# Patient Record
Sex: Male | Born: 1990 | Race: Asian | Hispanic: No | Marital: Single | Smoking: Never smoker
Health system: Southern US, Community
[De-identification: ages and names within clinical notes are randomized; demographics above are authoritative.]

## PROBLEM LIST (undated history)

## (undated) ENCOUNTER — Emergency Department (HOSPITAL_COMMUNITY): Admission: EM | Payer: Self-pay

## (undated) DIAGNOSIS — H9190 Unspecified hearing loss, unspecified ear: Secondary | ICD-10-CM

---

## 2011-10-21 ENCOUNTER — Emergency Department (INDEPENDENT_AMBULATORY_CARE_PROVIDER_SITE_OTHER): Payer: Medicaid Other

## 2011-10-21 ENCOUNTER — Encounter: Payer: Self-pay | Admitting: Emergency Medicine

## 2011-10-21 ENCOUNTER — Emergency Department (INDEPENDENT_AMBULATORY_CARE_PROVIDER_SITE_OTHER)
Admission: EM | Admit: 2011-10-21 | Discharge: 2011-10-21 | Disposition: A | Discharge status: Home / Self Care | Payer: Medicaid Other | Source: Home / Self Care | Attending: Emergency Medicine | Admitting: Emergency Medicine

## 2011-10-21 DIAGNOSIS — R6889 Other general symptoms and signs: Secondary | ICD-10-CM

## 2011-10-21 HISTORY — DX: Unspecified hearing loss, unspecified ear: H91.90

## 2011-10-21 MED ORDER — BENZONATATE 200 MG PO CAPS: 200.0000 mg | ORAL_CAPSULE | Freq: Three times a day (TID) | ORAL | Status: AC | PRN

## 2011-10-21 MED ORDER — TRAMADOL HCL 50 MG PO TABS: 100.0000 mg | ORAL_TABLET | Freq: Three times a day (TID) | ORAL | Status: AC | PRN

## 2011-10-21 NOTE — ED Provider Notes (Signed)
History     CSN: [id number] Arrival date & time: 10/21/2011  8:55 PM   First MD Initiated Contact with Patient 10/21/11 2014      Chief Complaint  Patient presents with  . Cough  . Fever    (Consider location/radiation/quality/duration/timing/severity/associated sxs/prior treatment) HPI Comments: He is a 20 year old male, a [name] who has only lived in [place] for a short time. 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 Now he's had a three-day history of fever, rhinorrhea, cough productive of white sputum, generalized aches, chest pain, and nasal congestion with white drainage. He's also had a sore throat and he vomited once last week. He has not had the flu vaccine.  His mother states he was born hearing, but hasn't illness at a young age and after that lost his ability to hear.  Patient is a 20 y.o. male presenting with cough and fever.  Cough Associated symptoms include rhinorrhea, sore throat and myalgias. Pertinent negatives include no chills, no ear pain, no shortness of breath, no wheezing and no eye redness.  Fever Primary symptoms of the febrile illness include fever, cough and myalgias. Primary symptoms do not include fatigue, wheezing, shortness of breath, abdominal pain, nausea, vomiting, diarrhea or rash.    Past Medical History  Diagnosis Date  . Deaf     History reviewed. No pertinent past surgical history.  History reviewed. No pertinent family history.  History  Substance Use Topics  . Smoking status: Never Smoker   . Smokeless tobacco: Not on file  . Alcohol Use: No      Review of Systems  Constitutional: Positive for fever. Negative for chills and fatigue.  HENT: Positive for hearing loss, congestion, sore throat and  rhinorrhea. Negative for ear pain, sneezing, neck stiffness, voice change and postnasal drip.   Eyes: Negative for pain, discharge and redness.  Respiratory: Positive for cough. Negative for chest tightness, shortness of breath and wheezing.   Gastrointestinal: Negative for nausea, vomiting, abdominal pain and diarrhea.  Musculoskeletal: Positive for myalgias.  Skin: Negative for rash.    Allergies  Review of patient's allergies indicates no known allergies.  Home Medications   Current Outpatient Rx  Name Route Sig Dispense Refill  . BENZONATATE 200 MG PO CAPS Oral Take 1 capsule (200 mg total) by mouth 3 (three) times daily as needed for cough. 30 capsule 0  . TRAMADOL HCL 50 MG PO TABS Oral Take 2 tablets (100 mg total) by mouth every 8 (eight) hours as needed for pain. Maximum dose= 8 tablets per day 30 tablet 0    BP 120/70  Pulse 73  Temp(Src) 98.6 F (37 C) (Oral)  Resp 20  SpO2 96%  Physical Exam  Nursing note and vitals reviewed. Constitutional: He appears well-developed and well-nourished. No distress.  HENT:  Head: Normocephalic and atraumatic.  Right Ear: External ear normal.  Left Ear: External ear normal.  Nose: Nose normal.  Mouth/Throat: Oropharynx is clear and moist. No oropharyngeal exudate.       Both ears show chronic otitis media without much drainage or exudate.  Eyes: Conjunctivae and EOM are normal. Pupils are equal, round, and reactive to light. Right eye exhibits no discharge. Left eye exhibits no discharge.  Neck: Normal range of motion.  Neck supple.  Cardiovascular: Normal rate, regular rhythm and normal heart sounds.   Pulmonary/Chest: Effort normal and breath sounds normal. No stridor. No respiratory distress. He has no wheezes. He has no rales. He exhibits no tenderness.  Lymphadenopathy:    He has no cervical adenopathy.  Skin: Skin is warm and dry. No rash noted. He is not diaphoretic.    ED Course  Procedures (including critical care  time)  Labs Reviewed - No data to display Dg Chest 2 View  10/21/2011  *RADIOLOGY REPORT*  Clinical Data: Cough and fever  CHEST - 2 VIEW  Comparison: None.  Findings: The heart, mediastinal, and hilar contours are normal. The lungs are well-expanded and clear. Negative for pleural effusion. The bony thorax is unremarkable.  IMPRESSION: No acute cardiopulmonary disease.  Original Report Authenticated By: Britta Mccreedy, M.D.     1. Influenza-like illness       MDM  Right now he has an influenza-like illness, but he appears to have chronic otitis media as the cause of his deafness. It's possible that with surgery and hearing aids this might be correctable.        Roque Lias, MD 10/21/11 2226

## 2011-10-21 NOTE — ED Notes (Signed)
Pt brought in nonenglish and deaf accompanied by mother and sign language interpretor present with c/o constant cough with white drainage,runny nose and warm to touch that started x1 wk ago according to [employer] translation line.afebrile today.no hx asthma.mother has been giving child nyquil for cough

## 2012-12-16 IMAGING — CR DG CHEST 2V
2 series · 2 of 2 positions shown · non-contrast
Comparison: None.

CLINICAL DATA: Cough and fever

CHEST - 2 VIEW

[view not recorded (1 of 2)]
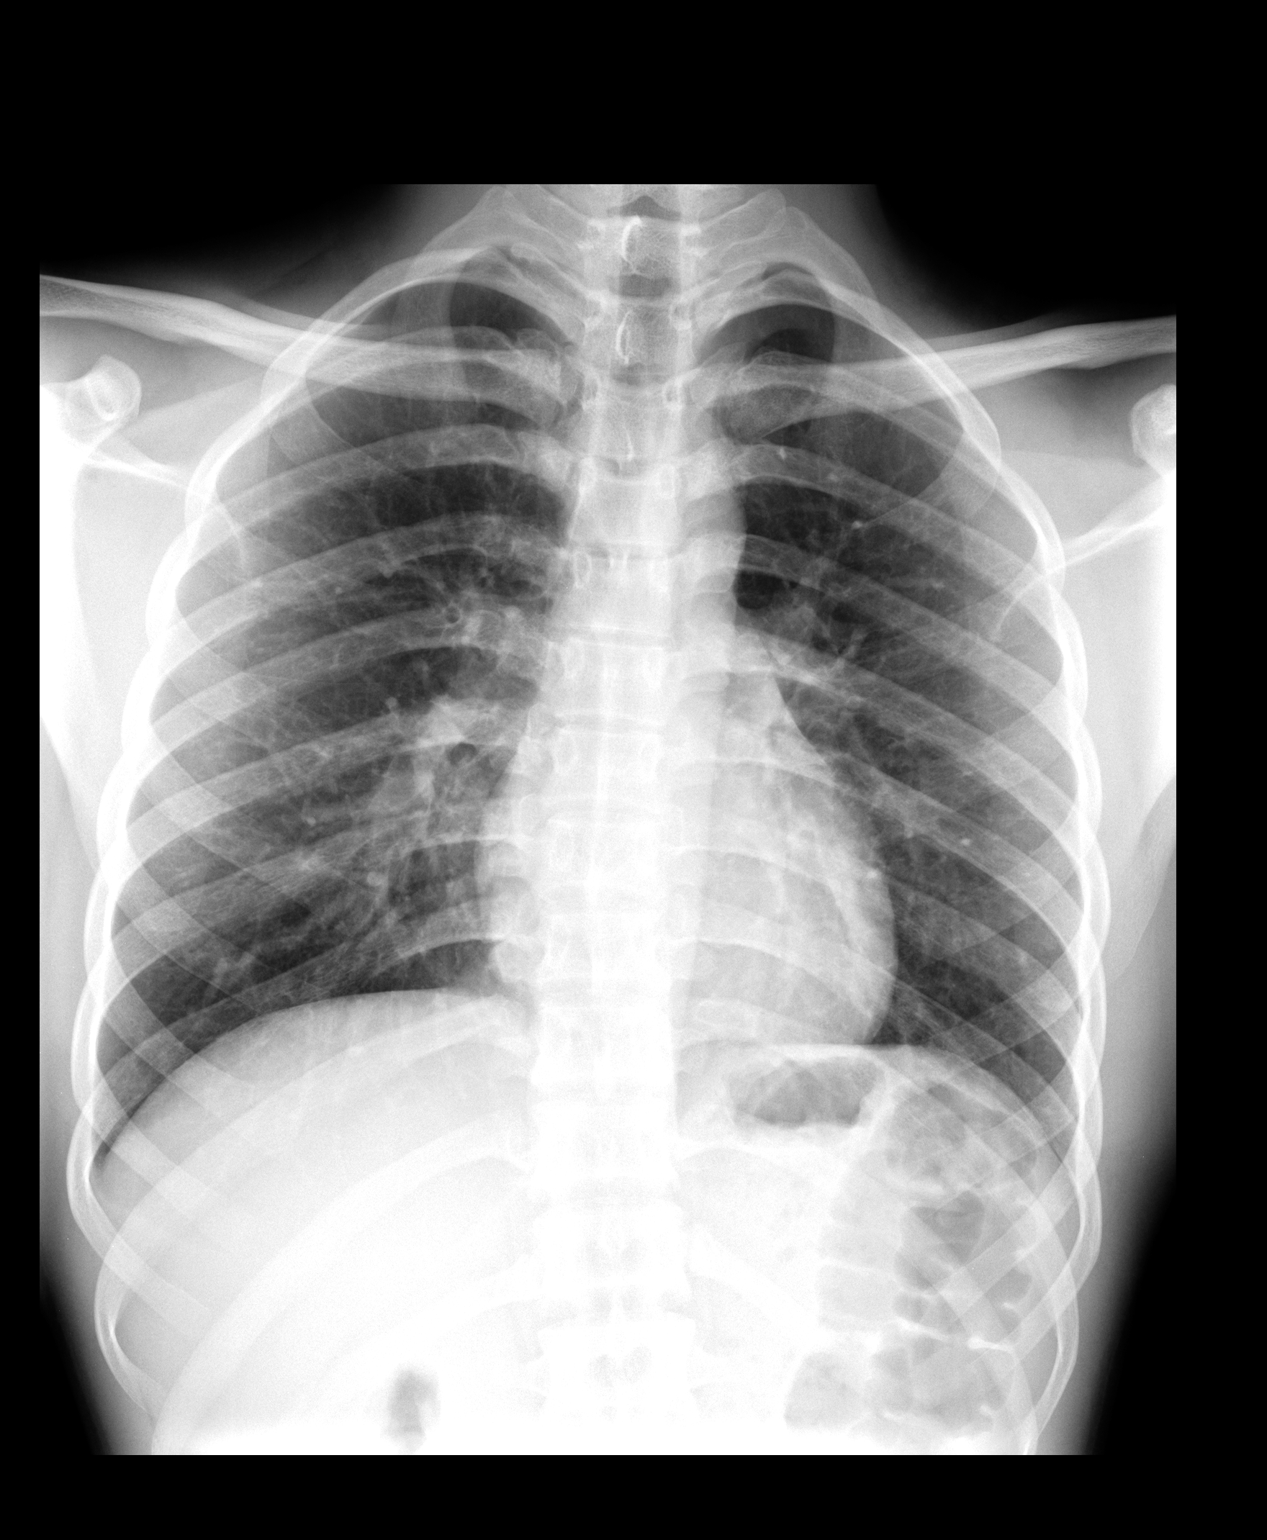

[view not recorded (2 of 2)]
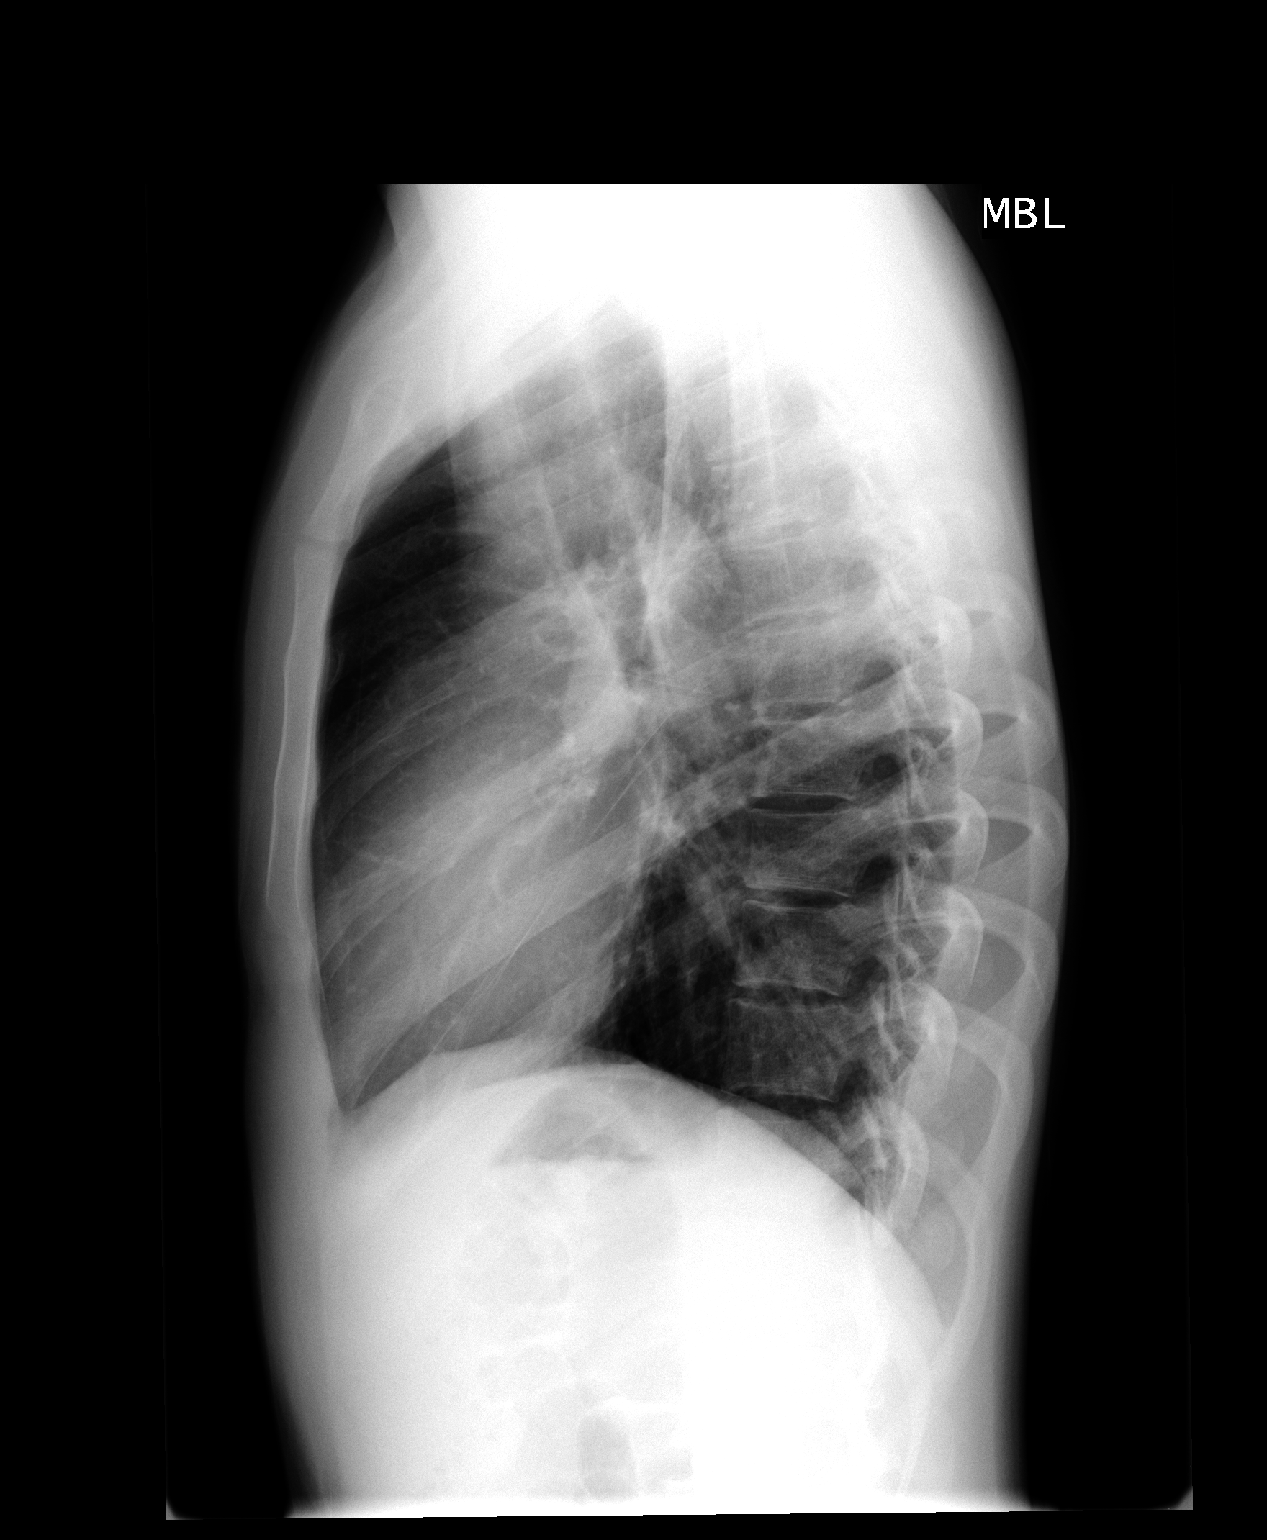

[2 of 2 positions shown; findings below may reference images not displayed]

FINDINGS: The heart, mediastinal, and hilar contours are normal.
The lungs are well-expanded and clear. Negative for pleural
effusion. The bony thorax is unremarkable.
IMPRESSION: No acute cardiopulmonary disease.

## 2019-06-17 ENCOUNTER — Other Ambulatory Visit: Payer: Self-pay | Admitting: Family Medicine

## 2019-06-17 DIAGNOSIS — Z20822 Contact with and (suspected) exposure to covid-19: Secondary | ICD-10-CM

## 2019-06-21 ENCOUNTER — Other Ambulatory Visit: Payer: Self-pay

## 2019-06-21 DIAGNOSIS — Z20822 Contact with and (suspected) exposure to covid-19: Secondary | ICD-10-CM

## 2019-06-22 LAB — NOVEL CORONAVIRUS, NAA: SARS-CoV-2, NAA: NOT DETECTED

## 2019-10-22 ENCOUNTER — Telehealth: Payer: Self-pay

## 2019-10-22 NOTE — Telephone Encounter (Signed)
Caller given negative and verbalized understanding

## 2020-05-18 ENCOUNTER — Ambulatory Visit: Payer: Self-pay | Attending: Internal Medicine

## 2020-05-18 DIAGNOSIS — Z23 Encounter for immunization: Secondary | ICD-10-CM

## 2020-05-18 NOTE — Progress Notes (Signed)
° °  Covid-19 Vaccination Clinic  Name:  Samuel Warren    MRN: [id number] DOB: February 07, 1991  05/18/2020  Samuel Warren was observed post Covid-19 immunization for 15 minutes without incident. He was provided with Vaccine Information Sheet and instruction to access the V-Safe system.   Samuel Warren was instructed to call 911 with any severe reactions post vaccine:  Difficulty breathing   Swelling of face and throat   A fast heartbeat   A bad rash all over body   Dizziness and weakness   Immunizations Administered    Name Date Dose VIS Date Route   Pfizer COVID-19 Vaccine 05/18/2020 10:44 AM 0.3 mL 01/05/2019 Intramuscular   Manufacturer: [employer], [employer]   Lot: [id number]   NDC: [address]-5

## 2020-06-10 ENCOUNTER — Ambulatory Visit: Payer: Self-pay | Attending: Internal Medicine

## 2020-06-10 ENCOUNTER — Other Ambulatory Visit: Payer: Self-pay

## 2020-06-10 DIAGNOSIS — Z23 Encounter for immunization: Secondary | ICD-10-CM

## 2020-06-10 NOTE — Progress Notes (Signed)
   Covid-19 Vaccination Clinic  Name:  Samuel Warren    MRN: [id number] DOB: 1991/10/20  06/10/2020  Mr. [name] was observed post Covid-19 immunization for 15 minutes without incident. He was provided with Vaccine Information Sheet and instruction to access the V-Safe system.   Mr. [name] was instructed to call 911 with any severe reactions post vaccine: Marland Kitchen Difficulty breathing  . Swelling of face and throat  . A fast heartbeat  . A bad rash all over body  . Dizziness and weakness   Immunizations Administered    Name Date Dose VIS Date Route   Pfizer COVID-19 Vaccine 06/10/2020  9:25 AM 0.3 mL 01/05/2019 Intramuscular   Manufacturer: [employer], [employer]   Lot: [id number]   NDC: [address]-4

## 2021-10-07 ENCOUNTER — Emergency Department (HOSPITAL_COMMUNITY)
Admission: EM | Admit: 2021-10-07 | Discharge: 2021-10-07 | Disposition: A | Discharge status: Home / Self Care | Payer: 59 | Attending: Emergency Medicine | Admitting: Emergency Medicine

## 2021-10-07 ENCOUNTER — Other Ambulatory Visit: Payer: Self-pay

## 2021-10-07 ENCOUNTER — Encounter (HOSPITAL_COMMUNITY): Payer: Self-pay | Admitting: Emergency Medicine

## 2021-10-07 DIAGNOSIS — S61411A Laceration without foreign body of right hand, initial encounter: Secondary | ICD-10-CM | POA: Insufficient documentation

## 2021-10-07 DIAGNOSIS — W458XXA Other foreign body or object entering through skin, initial encounter: Secondary | ICD-10-CM | POA: Insufficient documentation

## 2021-10-07 DIAGNOSIS — Z23 Encounter for immunization: Secondary | ICD-10-CM | POA: Insufficient documentation

## 2021-10-07 DIAGNOSIS — L089 Local infection of the skin and subcutaneous tissue, unspecified: Secondary | ICD-10-CM

## 2021-10-07 DIAGNOSIS — Y9339 Activity, other involving climbing, rappelling and jumping off: Secondary | ICD-10-CM | POA: Insufficient documentation

## 2021-10-07 DIAGNOSIS — S6991XA Unspecified injury of right wrist, hand and finger(s), initial encounter: Secondary | ICD-10-CM | POA: Diagnosis present

## 2021-10-07 MED ORDER — TETANUS-DIPHTH-ACELL PERTUSSIS 5-2.5-18.5 LF-MCG/0.5 IM SUSY
0.5000 mL | PREFILLED_SYRINGE | Freq: Once | INTRAMUSCULAR | Status: AC
  Administered 2021-10-07: 0.5 mL via INTRAMUSCULAR
  Filled 2021-10-07: qty 0.5

## 2021-10-07 MED ORDER — SULFAMETHOXAZOLE-TRIMETHOPRIM 800-160 MG PO TABS: 1.0000 | ORAL_TABLET | Freq: Two times a day (BID) | ORAL | 0 refills | Status: AC

## 2021-10-07 MED ORDER — IBUPROFEN 600 MG PO TABS
600.0000 mg | ORAL_TABLET | Freq: Four times a day (QID) | ORAL | 0 refills | Status: DC | PRN
Start: 2021-10-07 — End: 2024-01-25

## 2021-10-07 NOTE — Discharge Instructions (Signed)
Please cleanse wound with gentle soap and water several times daily.  Take antibiotic for the full duration.  Take ibuprofen as needed for pain.  Return if your symptoms worsen.   Hy lm s?ch v?t th??ng b?ng x phng d?u nh? v n??c nhi?u l?n m?i ngy. U?ng khng sinh ?? th?i gian. U?ng ibuprofen khi c?n thi?t ?? gi?m ?au. Quay tr? l?i n?u cc tri?u ch?ng c?a b?n x?u ?i.

## 2021-10-07 NOTE — ED Provider Notes (Signed)
[facility] EMERGENCY DEPARTMENT Provider Note   CSN: [id number] Arrival date & time: 10/07/21  1215     History No chief complaint on file.   Samuel Warren is a 30 y.o. male.  The history is provided by a parent. No language interpreter was used.    30 year old male with hx of deafness, accompany by father to there ER for hand injury.  Hx obtain through father.  Father report a few days ago pt was climbing a tree and was trying to nail something to the tree when he suffered a laceration to the palm of his R hand.  Since then it has increasingly becoming more swollen, with redness and more painful.  No fever, unsure last tdap.  No specific treatment tried.  Pt is R hand dominant.    No past medical history on file.  There are no problems to display for this patient.  The histories are not reviewed yet. Please review them in the "History" navigator section and refresh this SmartLink.     No family history on file.     Home Medications Prior to Admission medications   Not on File    Allergies    Patient has no allergy information on record.  Review of Systems   Review of Systems  Constitutional:  Negative for fever.  Skin:  Positive for wound.   Physical Exam Updated Vital Signs BP 104/79 (BP Location: Left Arm)   Pulse 65   Temp 98.3 F (36.8 C)   Resp 16   SpO2 99%   Physical Exam Vitals and nursing note reviewed.  Constitutional:      General: He is not in acute distress.    Appearance: He is well-developed.  HENT:     Head: Atraumatic.  Eyes:     Conjunctiva/sclera: Conjunctivae normal.  Musculoskeletal:        General: Signs of injury (R hand: a crescent shape 2cm laceration noted to the hypothenar eminence with surrounding skin erythema noted, ttp.  no abscess) present.     Cervical back: Neck supple.  Skin:    Findings: No rash.  Neurological:     Mental Status: He is alert.    ED Results / Procedures / Treatments    Labs (all labs ordered are listed, but only abnormal results are displayed) Labs Reviewed - No data to display  EKG None  Radiology No results found.  Procedures Procedures   Medications Ordered in ED Medications  Tdap (BOOSTRIX) injection 0.5 mL (has no administration in time range)    ED Course  I have reviewed the triage vital signs and the nursing notes.  Pertinent labs & imaging results that were available during my care of the patient were reviewed by me and considered in my medical decision making (see chart for details).    MDM Rules/Calculators/A&P                           BP 104/79 (BP Location: Left Arm)   Pulse 65   Temp 98.3 F (36.8 C)   Resp 16   SpO2 99%   Final Clinical Impression(s) / ED Diagnoses Final diagnoses:  Laceration of right hand with infection, initial encounter    Rx / DC Orders ED Discharge Orders          Ordered    sulfamethoxazole-trimethoprim (BACTRIM DS) 800-160 MG tablet  2 times daily  10/07/21 1307    ibuprofen (ADVIL) 600 MG tablet  Every 6 hours PRN        10/07/21 1307           1:05 PM Pt with a gaping superficial laceration noted to R hand in the hypothenar eminence.  The laceration is not amenable for lac repair.  There are signs of infection, will prescribe abx, recommend appropriate wound care and return precaution given.  Tdap given. BP 104/79 (BP Location: Left Arm)   Pulse 65   Temp 98.3 F (36.8 C)   Resp 16   SpO2 99%     Fayrene Helper, PA-C 10/07/21 1309    Terald Sleeper, MD 10/07/21 1357

## 2021-10-07 NOTE — ED Triage Notes (Signed)
C/o laceration to R hand from a nail yesterday.  Redness around wound.

## 2024-01-25 ENCOUNTER — Other Ambulatory Visit: Payer: Self-pay

## 2024-01-25 ENCOUNTER — Emergency Department (HOSPITAL_COMMUNITY)

## 2024-01-25 ENCOUNTER — Encounter (HOSPITAL_COMMUNITY): Payer: Self-pay

## 2024-01-25 ENCOUNTER — Inpatient Hospital Stay (HOSPITAL_COMMUNITY)
Admission: EM | Admit: 2024-01-25 | Discharge: 2024-01-29 | DRG: 097 | Disposition: A | Discharge status: Home / Self Care | Attending: Internal Medicine | Admitting: Internal Medicine

## 2024-01-25 DIAGNOSIS — R519 Headache, unspecified: Secondary | ICD-10-CM | POA: Diagnosis present

## 2024-01-25 DIAGNOSIS — Z7982 Long term (current) use of aspirin: Secondary | ICD-10-CM

## 2024-01-25 DIAGNOSIS — D72829 Elevated white blood cell count, unspecified: Secondary | ICD-10-CM | POA: Diagnosis not present

## 2024-01-25 DIAGNOSIS — R7989 Other specified abnormal findings of blood chemistry: Secondary | ICD-10-CM | POA: Diagnosis present

## 2024-01-25 DIAGNOSIS — A3981 Meningococcal encephalitis: Secondary | ICD-10-CM | POA: Diagnosis not present

## 2024-01-25 DIAGNOSIS — R29705 NIHSS score 5: Secondary | ICD-10-CM | POA: Diagnosis not present

## 2024-01-25 DIAGNOSIS — Z1152 Encounter for screening for COVID-19: Secondary | ICD-10-CM | POA: Diagnosis not present

## 2024-01-25 DIAGNOSIS — Z79899 Other long term (current) drug therapy: Secondary | ICD-10-CM | POA: Diagnosis not present

## 2024-01-25 DIAGNOSIS — G935 Compression of brain: Secondary | ICD-10-CM | POA: Diagnosis present

## 2024-01-25 DIAGNOSIS — E785 Hyperlipidemia, unspecified: Secondary | ICD-10-CM | POA: Diagnosis present

## 2024-01-25 DIAGNOSIS — A86 Unspecified viral encephalitis: Principal | ICD-10-CM | POA: Diagnosis present

## 2024-01-25 DIAGNOSIS — G9340 Encephalopathy, unspecified: Secondary | ICD-10-CM | POA: Diagnosis present

## 2024-01-25 DIAGNOSIS — I639 Cerebral infarction, unspecified: Secondary | ICD-10-CM | POA: Diagnosis present

## 2024-01-25 DIAGNOSIS — M542 Cervicalgia: Principal | ICD-10-CM | POA: Diagnosis present

## 2024-01-25 DIAGNOSIS — G0490 Encephalitis and encephalomyelitis, unspecified: Secondary | ICD-10-CM | POA: Diagnosis not present

## 2024-01-25 DIAGNOSIS — G039 Meningitis, unspecified: Secondary | ICD-10-CM | POA: Diagnosis not present

## 2024-01-25 DIAGNOSIS — E8721 Acute metabolic acidosis: Secondary | ICD-10-CM | POA: Diagnosis present

## 2024-01-25 DIAGNOSIS — Z603 Acculturation difficulty: Secondary | ICD-10-CM | POA: Diagnosis present

## 2024-01-25 DIAGNOSIS — H9190 Unspecified hearing loss, unspecified ear: Secondary | ICD-10-CM | POA: Diagnosis present

## 2024-01-25 DIAGNOSIS — A879 Viral meningitis, unspecified: Secondary | ICD-10-CM | POA: Diagnosis present

## 2024-01-25 DIAGNOSIS — I6389 Other cerebral infarction: Secondary | ICD-10-CM | POA: Diagnosis not present

## 2024-01-25 DIAGNOSIS — R7401 Elevation of levels of liver transaminase levels: Secondary | ICD-10-CM | POA: Diagnosis present

## 2024-01-25 DIAGNOSIS — R29707 NIHSS score 7: Secondary | ICD-10-CM | POA: Diagnosis not present

## 2024-01-25 DIAGNOSIS — R4182 Altered mental status, unspecified: Secondary | ICD-10-CM

## 2024-01-25 LAB — MENINGITIS/ENCEPHALITIS PANEL (CSF)

## 2024-01-25 LAB — BASIC METABOLIC PANEL
Anion gap: 15 (ref 5–15)
BUN: 18 mg/dL (ref 6–20)
CO2: 21 mmol/L — ABNORMAL LOW (ref 22–32)
Calcium: 10.1 mg/dL (ref 8.9–10.3)
Chloride: 101 mmol/L (ref 98–111)
Creatinine, Ser: 0.99 mg/dL (ref 0.61–1.24)
GFR, Estimated: >60 mL/min (ref >60)
Glucose, Bld: 148 mg/dL — ABNORMAL HIGH (ref 70–99)
Potassium: 3.8 mmol/L (ref 3.5–5.1)
Sodium: 137 mmol/L (ref 135–145)

## 2024-01-25 LAB — RESP PANEL BY RT-PCR (RSV, FLU A&B, COVID)  RVPGX2
Influenza A by PCR: NEGATIVE
Influenza B by PCR: NEGATIVE
Resp Syncytial Virus by PCR: NEGATIVE
SARS Coronavirus 2 by RT PCR: NEGATIVE

## 2024-01-25 LAB — CBC WITH DIFFERENTIAL/PLATELET
Abs Immature Granulocytes: 0.05 10*3/uL (ref 0.00–0.07)
Basophils Absolute: 0 10*3/uL (ref 0.0–0.1)
Basophils Relative: 0 %
Eosinophils Absolute: 0 10*3/uL (ref 0.0–0.5)
Eosinophils Relative: 0 %
HCT: 46.6 % (ref 39.0–52.0)
Hemoglobin: 14.7 g/dL (ref 13.0–17.0)
Immature Granulocytes: 0 %
Lymphocytes Relative: 7 %
Lymphs Abs: 0.9 10*3/uL (ref 0.7–4.0)
MCH: 26.5 pg (ref 26.0–34.0)
MCHC: 31.5 g/dL (ref 30.0–36.0)
MCV: 84.1 fL (ref 80.0–100.0)
Monocytes Absolute: 1 10*3/uL (ref 0.1–1.0)
Monocytes Relative: 9 %
Neutro Abs: 9.8 10*3/uL — ABNORMAL HIGH (ref 1.7–7.7)
Neutrophils Relative %: 84 %
Platelets: 194 10*3/uL (ref 150–400)
RBC: 5.54 MIL/uL (ref 4.22–5.81)
RDW: 13.6 % (ref 11.5–15.5)
WBC: 11.8 10*3/uL — ABNORMAL HIGH (ref 4.0–10.5)
nRBC: 0 % (ref 0.0–0.2)

## 2024-01-25 LAB — PROTEIN AND GLUCOSE, CSF
Glucose, CSF: 80 mg/dL — ABNORMAL HIGH (ref 40–70)
Total  Protein, CSF: 91 mg/dL — ABNORMAL HIGH (ref 15–45)

## 2024-01-25 LAB — CSF CELL COUNT WITH DIFFERENTIAL
Eosinophils, CSF: 0 % (ref 0–1)
Eosinophils, CSF: 0 % (ref 0–1)
Lymphs, CSF: 16 % — ABNORMAL LOW (ref 40–80)
Lymphs, CSF: 18 % — ABNORMAL LOW (ref 40–80)
Monocyte-Macrophage-Spinal Fluid: 24 % (ref 15–45)
Monocyte-Macrophage-Spinal Fluid: 34 % (ref 15–45)
RBC Count, CSF: 1330 /mm3 — ABNORMAL HIGH
RBC Count, CSF: 1350 /mm3 — ABNORMAL HIGH
Segmented Neutrophils-CSF: 50 % — ABNORMAL HIGH (ref 0–6)
Segmented Neutrophils-CSF: 58 % — ABNORMAL HIGH (ref 0–6)
Tube #: 1
Tube #: 4
WBC, CSF: 22 /mm3 (ref 0–5)
WBC, CSF: 22 /mm3 (ref 0–5)

## 2024-01-25 MED ORDER — DEXTROSE 5 % IV SOLN
10.0000 mg/kg | Freq: Once | INTRAVENOUS | Status: AC
Start: 2024-01-25 — End: 2024-01-25
  Administered 2024-01-25: 600 mg via INTRAVENOUS
  Filled 2024-01-25: qty 12

## 2024-01-25 MED ORDER — SODIUM CHLORIDE 0.9% FLUSH
3.0000 mL | Freq: Two times a day (BID) | INTRAVENOUS | Status: DC
  Administered 2024-01-25 – 2024-01-29 (×7): 3 mL via INTRAVENOUS

## 2024-01-25 MED ORDER — ACETAMINOPHEN 500 MG PO TABS
1000.0000 mg | ORAL_TABLET | Freq: Once | ORAL | Status: DC
  Administered 2024-01-25: 1000 mg via ORAL
  Filled 2024-01-25 (×2): qty 2

## 2024-01-25 MED ORDER — ALBUTEROL SULFATE (2.5 MG/3ML) 0.083% IN NEBU: 2.5000 mg | INHALATION_SOLUTION | Freq: Four times a day (QID) | RESPIRATORY_TRACT | Status: DC | PRN

## 2024-01-25 MED ORDER — ONDANSETRON HCL 4 MG/2ML IJ SOLN
4.0000 mg | Freq: Four times a day (QID) | INTRAMUSCULAR | Status: DC | PRN
  Administered 2024-01-27 – 2024-01-28 (×3): 4 mg via INTRAVENOUS
  Filled 2024-01-25 (×3): qty 2

## 2024-01-25 MED ORDER — ACETAMINOPHEN 650 MG RE SUPP: 650.0000 mg | Freq: Four times a day (QID) | RECTAL | Status: DC | PRN

## 2024-01-25 MED ORDER — SODIUM CHLORIDE 0.9 % IV SOLN
2.0000 g | Freq: Two times a day (BID) | INTRAVENOUS | Status: DC
  Administered 2024-01-25 – 2024-01-28 (×6): 2 g via INTRAVENOUS
  Filled 2024-01-25 (×5): qty 20

## 2024-01-25 MED ORDER — DEXAMETHASONE SODIUM PHOSPHATE 10 MG/ML IJ SOLN
10.0000 mg | Freq: Four times a day (QID) | INTRAMUSCULAR | Status: DC
  Administered 2024-01-25 – 2024-01-28 (×12): 10 mg via INTRAVENOUS
  Filled 2024-01-25 (×13): qty 1

## 2024-01-25 MED ORDER — DEXTROSE 5 % IV SOLN
10.0000 mg/kg | Freq: Three times a day (TID) | INTRAVENOUS | Status: DC
  Administered 2024-01-25 – 2024-01-26 (×2): 720 mg via INTRAVENOUS
  Filled 2024-01-25 (×3): qty 14.4

## 2024-01-25 MED ORDER — PROCHLORPERAZINE EDISYLATE 10 MG/2ML IJ SOLN
10.0000 mg | Freq: Once | INTRAMUSCULAR | Status: AC
  Administered 2024-01-25: 10 mg via INTRAVENOUS
  Filled 2024-01-25: qty 2

## 2024-01-25 MED ORDER — LIDOCAINE 5 % EX PTCH
1.0000 | MEDICATED_PATCH | CUTANEOUS | Status: DC
  Administered 2024-01-25 – 2024-01-29 (×5): 1 via TRANSDERMAL
  Filled 2024-01-25 (×5): qty 1

## 2024-01-25 MED ORDER — IOHEXOL 350 MG/ML SOLN
75.0000 mL | Freq: Once | INTRAVENOUS | Status: AC | PRN
  Administered 2024-01-25: 75 mL via INTRAVENOUS

## 2024-01-25 MED ORDER — ONDANSETRON HCL 4 MG PO TABS
4.0000 mg | ORAL_TABLET | Freq: Four times a day (QID) | ORAL | Status: DC | PRN
  Administered 2024-01-28: 4 mg via ORAL
  Filled 2024-01-25: qty 1

## 2024-01-25 MED ORDER — VANCOMYCIN HCL 750 MG/150ML IV SOLN
750.0000 mg | Freq: Three times a day (TID) | INTRAVENOUS | Status: DC
  Administered 2024-01-25 – 2024-01-28 (×8): 750 mg via INTRAVENOUS
  Filled 2024-01-25 (×8): qty 150

## 2024-01-25 MED ORDER — ENOXAPARIN SODIUM 40 MG/0.4ML IJ SOSY
40.0000 mg | PREFILLED_SYRINGE | INTRAMUSCULAR | Status: DC
  Administered 2024-01-25: 40 mg via SUBCUTANEOUS
  Filled 2024-01-25: qty 0.4

## 2024-01-25 MED ORDER — ACETAMINOPHEN 325 MG PO TABS
650.0000 mg | ORAL_TABLET | Freq: Four times a day (QID) | ORAL | Status: DC | PRN
  Administered 2024-01-25 – 2024-01-29 (×5): 650 mg via ORAL
  Filled 2024-01-25 (×6): qty 2

## 2024-01-25 MED ORDER — SODIUM CHLORIDE 0.9 % IV SOLN: INTRAVENOUS | Status: DC

## 2024-01-25 MED ORDER — VANCOMYCIN HCL IN DEXTROSE 1-5 GM/200ML-% IV SOLN: 1000.0000 mg | Freq: Once | INTRAVENOUS | Status: DC

## 2024-01-25 MED ORDER — METHOCARBAMOL 500 MG PO TABS
500.0000 mg | ORAL_TABLET | Freq: Once | ORAL | Status: AC
Start: 2024-01-25 — End: 2024-01-25
  Administered 2024-01-25: 500 mg via ORAL
  Filled 2024-01-25: qty 1

## 2024-01-25 MED ORDER — LORAZEPAM 2 MG/ML IJ SOLN
0.5000 mg | Freq: Once | INTRAMUSCULAR | Status: AC
  Administered 2024-01-25: 0.5 mg via INTRAVENOUS
  Filled 2024-01-25: qty 1

## 2024-01-25 MED ORDER — KETOROLAC TROMETHAMINE 15 MG/ML IJ SOLN
15.0000 mg | Freq: Once | INTRAMUSCULAR | Status: AC
  Administered 2024-01-25: 15 mg via INTRAMUSCULAR
  Filled 2024-01-25: qty 1

## 2024-01-25 MED ORDER — VANCOMYCIN HCL 1250 MG/250ML IV SOLN
1250.0000 mg | Freq: Once | INTRAVENOUS | Status: AC
  Administered 2024-01-25: 1250 mg via INTRAVENOUS
  Filled 2024-01-25: qty 250

## 2024-01-25 MED ORDER — DIPHENHYDRAMINE HCL 50 MG/ML IJ SOLN
12.5000 mg | Freq: Once | INTRAMUSCULAR | Status: AC
  Administered 2024-01-25: 12.5 mg via INTRAVENOUS
  Filled 2024-01-25: qty 1

## 2024-01-25 NOTE — H&P (Signed)
 History and Physical    Patient: Samuel Warren OZH:[id number] DOB: [birth year] DOA: 01/25/2024 DOS: the patient was seen and examined on 01/25/2024 PCP: Pcp, No  Patient coming from: Home  Chief Complaint:  Chief Complaint  Patient presents with   Headache   HPI: Samuel Warren is a 33 y.o. male with medical history significant of deafness presents with severe headache and neck pain. He is accompanied by his brother and mother who provide history as he unable to and reportedly knows limited sign language.  He developed a severe headache yesterday after returning from hunting wild boar  in [place], [place] approximately a two and a half hour  to 3 hour drive from here.  No recent injury or fall reported.  The headache began during the drive back, prompting him to stop at a gas station to purchase Tylenol. Despite taking Tylenol, the headache persisted.  It was reported that the patient had 1 episode of vomiting yesterday.  No reported tick bites to their knowledge.  This morning, he developed neck pain and stiffness, which is a new symptom for him. His family attempted to massage his neck using a quarter, but the pain continued and he was not acting like his normal self.   He does not smoke or drink alcohol. There are no reports of any wounds or injuries from the hunting trip.  In the ED patient was noted to be afebrile with blood pressures 106/85 to 142/94, and all other vital signs maintained.  Labs significant for WBC 11.8.  CT angiogram of the head and neck did not note any acute abnormality or large vessel occlusion.  Lumbar puncture was performed.  CSF fluid was reported to be clear blood cultures were obtained and patient was started on empiric antibiotics of Rocephin,  Review of Systems: unable to review all systems due to the inability of the patient to answer questions. Past Medical History:  Diagnosis Date   Deaf    History reviewed. No pertinent surgical history. Social History:   reports that he has never smoked. He has never used smokeless tobacco. He reports that he does not currently use alcohol. He reports that he does not currently use drugs.  No Known Allergies  History reviewed. No pertinent family history.  Prior to Admission medications   Medication Sig Start Date End Date Taking? Authorizing Provider  ibuprofen (ADVIL) 600 MG tablet Take 1 tablet (600 mg total) by mouth every 6 (six) hours as needed for moderate pain. 10/07/21   Fayrene Helper, PA-C    Physical Exam: Vitals:   01/25/24 1410 01/25/24 1415 01/25/24 1420 01/25/24 1430  BP: 120/87  120/86   Pulse: 68 67 (!) 59 60  Resp:      Temp:      TempSrc:      SpO2: 100% 100% 100% 100%   Constitutional: Young male who appears ill, and will not open eyes Eyes: PERRL, lids and conjunctivae normal ENMT: Mucous membranes are moist.  Fair dentition Neck: Neck is currently flex and does not want to move. Respiratory: clear to auscultation bilaterally, no wheezing, no crackles. Normal respiratory effort. No accessory muscle use.  Cardiovascular: Regular rate and rhythm, no murmurs / rubs / gallops. No extremity edema. 2+ pedal pulses. No carotid bruits.  Abdomen: no tenderness, no masses palpated.  Bowel sounds positive.  Musculoskeletal: no clubbing / cyanosis. No joint deformity upper and lower extremities. Good ROM, no contractures. Normal muscle tone.  Skin: Rash noted around neck where family  used a quarter to massage his neck Neurologic: CN 2-12 grossly intact.  Appears to move all extremities. Psychiatric: Unable to assess due to cooperation at this time.  Data Reviewed:  Reviewed labs, imaging, and pertinent records as documented.  Assessment and Plan:  Acute encephalopathy Headache and neck pain Acute.  Patient presents after being noted to be acutely altered with complaints of headache, neck pain, and reports of vomiting after going hunting for wild boar [place].  CTA of the head and  neck did not reveal any acute abnormality.  I will ED provider performed a lumbar puncture.  Initial studies noted glucose elevated at 80 and total protein 91.  Patient was started on empiric antibiotics of Rocephin, acyclovir, vancomycin -Admit to a telemetry bed -Neurochecks -Follow-up blood and lumbar puncture studies and culture -Continue empiric antibiotics of vancomycin, Rocephin, and azithromycin   Leukocytosis Acute.  WBC elevated 11.8. -Recheck CBC in a.m.  DVT prophylaxis: Lovenox Advance Care Planning:   Code Status: Not on file    Consults: None  Family Communication: Family updated at bedside  Severity of Illness: The appropriate patient status for this patient is INPATIENT. Inpatient status is judged to be reasonable and necessary in order to provide the required intensity of service to ensure the patient's safety. The patient's presenting symptoms, physical exam findings, and initial radiographic and laboratory data in the context of their chronic comorbidities is felt to place them at high risk for further clinical deterioration. Furthermore, it is not anticipated that the patient will be medically stable for discharge from the hospital within 2 midnights of admission.   * I certify that at the point of admission it is my clinical judgment that the patient will require inpatient hospital care spanning beyond 2 midnights from the point of admission due to high intensity of service, high risk for further deterioration and high frequency of surveillance required.*  Author: Clydie Braun, MD 01/25/2024 3:18 PM  For on call review [email or web address].

## 2024-01-25 NOTE — ED Provider Notes (Signed)
 [place] EMERGENCY DEPARTMENT AT [facility] Provider Note   CSN: [id number] Arrival date & time: 01/25/24  1101     History  Chief Complaint  Patient presents with   Headache    Samuel Warren is a 33 y.o. male with PMHx deafness who presents to ED concerned for severe headache and neck pain that started yesterday afternoon after returning from a hunting trip. Patient also with an episode of vomiting yesterday. Family has been attempting OTC and other natural remedies for the pain, which have not been helping. Family denies rash or tick bites. Family stating that patient is not very fluent in ASL. Family denies any recent trauma.   Denies fever, cough, diarrhea.   Headache      Home Medications Prior to Admission medications   Medication Sig Start Date End Date Taking? Authorizing Provider  ibuprofen (ADVIL) 600 MG tablet Take 1 tablet (600 mg total) by mouth every 6 (six) hours as needed for moderate pain. 10/07/21   Fayrene Helper, PA-C      Allergies    Patient has no known allergies.    Review of Systems   Review of Systems  Neurological:  Positive for headaches.    Physical Exam Updated Vital Signs BP 120/86   Pulse 60   Temp 98.6 F (37 C) (Oral)   Resp 16   SpO2 100%  Physical Exam Vitals and nursing note reviewed.  Constitutional:      General: He is not in acute distress.    Appearance: He is ill-appearing. He is not toxic-appearing.  HENT:     Head: Normocephalic and atraumatic.     Mouth/Throat:     Mouth: Mucous membranes are moist.     Pharynx: No oropharyngeal exudate or posterior oropharyngeal erythema.  Eyes:     General: No scleral icterus.       Right eye: No discharge.        Left eye: No discharge.     Conjunctiva/sclera: Conjunctivae normal.  Neck:     Comments: Neck in extension, patient refuses to move. Also refuses to make eye contact.   There is superficial hematoma/rash on back of neck which family members state is d/t  rubbing a coin on the patient's neck. Cardiovascular:     Rate and Rhythm: Normal rate and regular rhythm.     Pulses: Normal pulses.     Heart sounds: Normal heart sounds. No murmur heard. Pulmonary:     Effort: Pulmonary effort is normal. No respiratory distress.     Breath sounds: Normal breath sounds. No wheezing, rhonchi or rales.  Abdominal:     General: Bowel sounds are normal.     Palpations: Abdomen is soft.     Tenderness: There is no abdominal tenderness.  Musculoskeletal:     Right lower leg: No edema.     Left lower leg: No edema.  Skin:    General: Skin is warm and dry.     Findings: No rash.  Neurological:     General: No focal deficit present.     Mental Status: He is alert. Mental status is at baseline.  Psychiatric:        Mood and Affect: Mood normal.     ED Results / Procedures / Treatments   Labs (all labs ordered are listed, but only abnormal results are displayed) Labs Reviewed  CBC WITH DIFFERENTIAL/PLATELET - Abnormal; Notable for the following components:      Result Value   WBC  11.8 (*)    Neutro Abs 9.8 (*)    All other components within normal limits  BASIC METABOLIC PANEL - Abnormal; Notable for the following components:   CO2 21 (*)    Glucose, Bld 148 (*)    All other components within normal limits  RESP PANEL BY RT-PCR (RSV, FLU A&B, COVID)  RVPGX2  CSF CULTURE W GRAM STAIN  CULTURE, BLOOD (ROUTINE X 2)  CULTURE, BLOOD (ROUTINE X 2)  CSF CELL COUNT WITH DIFFERENTIAL  CSF CELL COUNT WITH DIFFERENTIAL  PROTEIN AND GLUCOSE, CSF  VDRL, CSF  LYME DISEASE SEROLOGY W/REFLEX  MENINGITIS/ENCEPHALITIS PANEL (CSF)    EKG None  Radiology CT ANGIO HEAD NECK W WO CM Result Date: 01/25/2024 CLINICAL DATA:  Neuro deficit, acute, stroke suspected EXAM: CT ANGIOGRAPHY HEAD AND NECK WITH AND WITHOUT CONTRAST TECHNIQUE: Multidetector CT imaging of the head and neck was performed using the standard protocol during bolus administration of  intravenous contrast. Multiplanar CT image reconstructions and MIPs were obtained to evaluate the vascular anatomy. Carotid stenosis measurements (when applicable) are obtained utilizing NASCET criteria, using the distal internal carotid diameter as the denominator. RADIATION DOSE REDUCTION: This exam was performed according to the departmental dose-optimization program which includes automated exposure control, adjustment of the mA and/or kV according to patient size and/or use of iterative reconstruction technique. CONTRAST:  75mL OMNIPAQUE IOHEXOL 350 MG/ML SOLN COMPARISON:  None Available. FINDINGS: CT HEAD FINDINGS Brain: No focal brain insult is seen. There are no visible subarachnoid spaces other than slit like ventricles, raising the possibility of generalized brain swelling or subacute subarachnoid hemorrhage. Vascular: No abnormal vascular finding. Skull: Negative Sinuses/Orbits: Clear/normal Other: None Review of the MIP images confirms the above findings CTA NECK FINDINGS Aortic arch: Normal Right carotid system: Common carotid artery widely patent to the bifurcation. Normal bifurcation. Normal cervical ICA. Left carotid system: Left carotid system similarly normal. Vertebral arteries: Both vertebral artery origins are widely patent. The left is dominant. Both vessels are normal through the cervical region to the foramen magnum. Skeleton: Negative Other neck: No significant finding. Upper chest: Lung apices are clear. Review of the MIP images confirms the above findings CTA HEAD FINDINGS Anterior circulation: Both internal carotid arteries are patent through the skull base and siphon regions. Intracranial detail is limited by motion and artifact from upper extremities. No evidence of vascular occlusion. No aneurysm is identified, but detail is limited. Posterior circulation: Both vertebral arteries are patent through the foramen magnum. Right may terminate in PICA. Left vertebral artery supplies basilar  artery. Basilar detail is limited by motion. Flow is present in both posterior cerebral arteries. Venous sinuses: Patent and normal. Anatomic variants: None significant. Review of the MIP images confirms the above findings IMPRESSION: 1. No focal brain insult is seen. There are no visible subarachnoid spaces other than slit like ventricles, raising the possibility of generalized brain swelling or subacute subarachnoid hemorrhage. 2. No evidence of arterial stenosis or occlusion in the neck. 3. Intracranial detail is limited by motion and artifact from upper extremities. No evidence of vascular occlusion. No aneurysm is identified, but detail is limited. Electronically Signed   By: Paulina Fusi M.D.   On: 01/25/2024 14:07    Procedures Procedures    Medications Ordered in ED Medications  lidocaine (LIDODERM) 5 % 1 patch (1 patch Transdermal Patch Applied 01/25/24 1305)  dexamethasone (DECADRON) injection 10 mg (has no administration in time range)  cefTRIAXone (ROCEPHIN) 2 g in sodium chloride 0.9 % 100  mL IVPB (has no administration in time range)  vancomycin (VANCOREADY) IVPB 1250 mg/250 mL (has no administration in time range)  acyclovir (ZOVIRAX) 600 mg in dextrose 5 % 100 mL IVPB (has no administration in time range)  0.9 %  sodium chloride infusion (has no administration in time range)  LORazepam (ATIVAN) injection 0.5 mg (0.5 mg Intravenous Given 01/25/24 1305)  methocarbamol (ROBAXIN) tablet 500 mg (500 mg Oral Given 01/25/24 1305)  ketorolac (TORADOL) 15 MG/ML injection 15 mg (15 mg Intramuscular Given 01/25/24 1309)  iohexol (OMNIPAQUE) 350 MG/ML injection 75 mL (75 mLs Intravenous Contrast Given 01/25/24 1355)    ED Course/ Medical Decision Making/ A&P Clinical Course as of 01/25/24 1525  Sun Jan 25, 2024  1329 This is a 33 year old male who presents to the ED in the company of his family with concern for headache and neck pain.  Patient's brother reports around hunting yesterday on the  way home the patient was complaining of a headache and suffered Tylenol.  Afterwards overnight into this morning he was complaining of neck pain, and seemed confused off his baseline status.  The patient is deaf at baseline but does typically sign.  On exam the patient will not open his eyes or make eye contact.  He is sitting with his neck extended, and appears to have discomfort with straightening of the neck.  Pupils are 4 mm bilaterally and sluggishly reactive to light.  He is mildly hypertensive.  Initial labs notable for white blood cell count 11.8.  Hemoglobin within normal limits.  We have requested stat CT angiogram imaging for ICH/aneurysm evaluation.   [MT]  1447 Successful LP with clear CSF fluid - no evident blood - will send for CSF studies but low threshold to start treatment empirically for infectious etiology/meningitis at this time. [MT]  1449 Be aware that tube collection was inverted, so Tube #4 was drawn first, and Tube #1 was drawn last.  We'll check the RBC differential [MT]    Clinical Course User Index [MT] Trifan, Kermit Balo, MD                                 Medical Decision Making Amount and/or Complexity of Data Reviewed Labs: ordered. Radiology: ordered.  Risk Prescription drug management.   This patient presents to the ED for concern of AMS and neck pain, this involves an extensive number of treatment options, and is a complaint that carries with it a high risk of complications and morbidity.  The differential diagnosis includes torticollis, meningitis, intracranial hemorrhage, muscle strain, Lyme disease   Co morbidities that complicate the patient evaluation  Deaf   Additional history obtained:  No PCP listed in chart   Problem List / ED Course / Critical interventions / Medication management  Admitting patient for AMS and severe neck pain.  Patient presents to ED with family at bedside concerned for severe headache and neck pain intractable to OTC  and other natural remedies. Patient appears altered on initial exam.  Not opening his eyes or making eye contact.  Unwilling to straighten his neck.  Patient's vitals are stable.  Rest of physical exam reassuring. I Ordered, and personally interpreted labs.  Respiratory panel negative.  CBC with leukocytosis at 11.8.  No anemia.  BMP with hyperglycemia at 148, but is otherwise reassuring. LP performed by Dr. Renaye Rakers sent and pending. LP does not look bloody or discolored. I ordered imaging studies  including CTA head/neck . I independently visualized and interpreted imaging which showed possible brain swelling versus subacute subarachnoid hemorrhage. I agree with the radiologist interpretation Given LP, Dr. Renaye Rakers and I are more concerned for meningitis. Started IV ABX. Patient was given headache treatment earlier in this ED course without improvement in symptoms.  3:22PM: Dr. Katrinka Blazing admitting provider I have reviewed the patients home medicines and have made adjustments as needed   Social Determinants of Health:  deaf         Final Clinical Impression(s) / ED Diagnoses Final diagnoses:  Neck pain  Intractable headache, unspecified chronicity pattern, unspecified headache type  Altered mental status, unspecified altered mental status type    Rx / DC Orders ED Discharge Orders     None         Dorthy Cooler, [place] 01/25/24 1525    Terald Sleeper, MD 01/25/24 1605

## 2024-01-25 NOTE — Progress Notes (Signed)
 EKG couldn't be obtained due to patient's agitation in response to try to reposition the patient, hasn't open his eyes yet, MD informed, handoff given to the night staff.

## 2024-01-25 NOTE — Progress Notes (Addendum)
 Patient received from ED, patient is responding to pain, non-verbal, deaf, he is impulsive, was agitated when staff tried to obtain vital signs, sleeping in fetal position. Vital signs WNL, CCMD notified, CHG bath given, family is at bedside, information provided to the family about the resources and the unit, call bell in reach,   IVF continue.  01/25/24 1804  Vitals  Temp (!) 97.5 F (36.4 C)  Temp Source Oral  BP (!) 142/86  MAP (mmHg) 102  BP Location Right Arm  BP Method Automatic  Patient Position (if appropriate) Lying  Pulse Rate 78  Pulse Rate Source Monitor  ECG Heart Rate 73  Resp 20  Level of Consciousness  Level of Consciousness Responds to Pain  Oxygen Therapy  SpO2 97 %  O2 Device Room Air  Pain Assessment  Pain Scale PAINAD  Pain Score Asleep

## 2024-01-25 NOTE — ED Notes (Signed)
 Stat page sent to R. Smith.

## 2024-01-25 NOTE — ED Notes (Addendum)
 Pt lethargic and/or feeling bad. Was dizzy yesterday with HA, and NV. Was able to take tylenol yesterday after coming in from the field after 6 hrs of hunting boar. No known sick contacts, no drug or alcohol use, no obvious ticks found. No others with similar sx. Markings on back of neck is coining from family. Difficult exam d/t presentation and deafness.   Awake, semi alert, prefers eyes closed, blank stair, tracks. MAEx4. PERRL 4mm.

## 2024-01-25 NOTE — ED Notes (Signed)
 Patient transported to CT

## 2024-01-25 NOTE — ED Notes (Signed)
 CT notified of emergent CT and complications

## 2024-01-25 NOTE — Progress Notes (Signed)
 Pharmacy Antibiotic Note - Update  Consults received to continue vancomycin and acyclovir for r/o meningitis per pharmacy on admission.  Wt = 72kg Ht = 64 inches SCr = 0.99 CrCl ~97 ml/min  Plan: - Vancomycin 750mg  IV q8h x 14 days, plan to check trough at steady state (goal 15-20 mcg/ml) - Acyclovir 10mg /kg IV q8h w/ NS at 125 ml/hr - Monitor renal function closely  Loralee Pacas, PharmD, BCPS 01/25/2024 8:01 PM

## 2024-01-25 NOTE — ED Notes (Signed)
EDP at BS speaking with family 

## 2024-01-25 NOTE — ED Provider Notes (Signed)
 Lumbar Puncture  Date/Time: 01/25/2024 4:05 PM  Performed by: Terald Sleeper, MD Authorized by: Terald Sleeper, MD   Consent:    Consent obtained:  Verbal   Consent given by:  Parent and guardian (Brother, mother)   Risks, benefits, and alternatives were discussed: yes     Risks discussed:  Bleeding, headache, infection, nerve damage, pain and repeat procedure Universal protocol:    Procedure explained and questions answered to patient or proxy's satisfaction: yes     Relevant documents present and verified: yes     Test results available: yes     Imaging studies available: yes     Required blood products, implants, devices, and special equipment available: yes     Immediately prior to procedure a time out was called: yes     Site/side marked: yes     Patient identity confirmed:  Arm band Pre-procedure details:    Procedure purpose:  Diagnostic   Preparation: Patient was prepped and draped in usual sterile fashion   Sedation:    Sedation type:  Anxiolysis Anesthesia:    Anesthesia method:  Local infiltration   Local anesthetic:  Lidocaine 1% w/o epi Procedure details:    Lumbar space:  L4-L5 interspace   Patient position:  R lateral decubitus   Number of attempts:  2   Fluid appearance:  Clear   Tubes of fluid:  4   Total volume (ml):  5 Post-procedure details:    Puncture site:  Adhesive bandage applied and direct pressure applied   Procedure completion:  Tolerated well, no immediate complications .Critical Care  Performed by: Terald Sleeper, MD Authorized by: Terald Sleeper, MD   Critical care provider statement:    Critical care time (minutes):  30   Critical care time was exclusive of:  Separately billable procedures and treating other patients   Critical care was necessary to treat or prevent imminent or life-threatening deterioration of the following conditions:  Sepsis   Critical care was time spent personally by me on the following activities:   Ordering and performing treatments and interventions, ordering and review of laboratory studies, ordering and review of radiographic studies, pulse oximetry, review of old charts, examination of patient and evaluation of patient's response to treatment     Terald Sleeper, MD 01/25/24 1606

## 2024-01-25 NOTE — Progress Notes (Signed)
 Pharmacy Antibiotic Note  Samuel Warren is a 33 y.o. male for which pharmacy has been consulted for acyclovir and vancomycin dosing for meningitis.  Patient with a history of deafness . Patient presenting with severe headache and neck pain that started yesterday afternoon after returning from a hunting trip.  SCr 0.99 WBC 11.8; T 98.6; HR 60; RR 16 COVID neg / flu neg  CTA Head/Neck: no visible subarachnoid spaces other than slit like ventricles, raising the possibility of generalized brain swelling or subacute subarachnoid hemorrhage.  Lyme serology - IP Meningitis/encephalitis panel - IP CSF gram stain - WBC PRESENT,BOTH PMN AND MONONUCLEAR; NO ORGANISMS SEEN   Plan: Rocephin 2g q12h Acyclovir 600mg  and vancomycin 1250mg  ordered once in the ED NEED HEIGHT AND WEIGHT FOR FURTHER DOSE CALCULATIONS. Bed scale not operable in the ED. Will f/u if attending continues on admission. Monitor WBC, fever, renal function, cultures De-escalate when able Levels at steady state     Temp (24hrs), Avg:98.6 F (37 C), Min:98.6 F (37 C), Max:98.6 F (37 C)  Recent Labs  Lab 01/25/24 1218  WBC 11.8*  CREATININE 0.99    CrCl cannot be calculated (Unknown ideal weight.).    No Known Allergies  Microbiology results: Pending  Thank you for allowing pharmacy to be a part of this patient's care.  Delmar Landau, PharmD, BCPS 01/25/2024 2:48 PM ED Clinical Pharmacist -  [phone]

## 2024-01-25 NOTE — ED Notes (Signed)
EDP at BS for LP. 

## 2024-01-25 NOTE — ED Triage Notes (Addendum)
 Pt bib ems, per family pt got home from hunting yesterday c.o severe headache, pt has been grabbing his neck. Worse when he moves his neck, 1 episode of emesis this morning at home. Took tylenol last night  Pt is deaf and does not know ASL. Family en route to hospital to provide more information

## 2024-01-26 ENCOUNTER — Inpatient Hospital Stay (HOSPITAL_COMMUNITY)

## 2024-01-26 DIAGNOSIS — R29707 NIHSS score 7: Secondary | ICD-10-CM

## 2024-01-26 DIAGNOSIS — G0490 Encephalitis and encephalomyelitis, unspecified: Secondary | ICD-10-CM

## 2024-01-26 DIAGNOSIS — A86 Unspecified viral encephalitis: Secondary | ICD-10-CM

## 2024-01-26 DIAGNOSIS — A3981 Meningococcal encephalitis: Secondary | ICD-10-CM | POA: Diagnosis not present

## 2024-01-26 DIAGNOSIS — M542 Cervicalgia: Secondary | ICD-10-CM | POA: Diagnosis not present

## 2024-01-26 DIAGNOSIS — G9340 Encephalopathy, unspecified: Secondary | ICD-10-CM | POA: Diagnosis not present

## 2024-01-26 DIAGNOSIS — I639 Cerebral infarction, unspecified: Secondary | ICD-10-CM | POA: Diagnosis not present

## 2024-01-26 DIAGNOSIS — R519 Headache, unspecified: Secondary | ICD-10-CM | POA: Diagnosis not present

## 2024-01-26 LAB — COMPREHENSIVE METABOLIC PANEL
ALT: 120 U/L — ABNORMAL HIGH (ref 0–44)
AST: 51 U/L — ABNORMAL HIGH (ref 15–41)
Albumin: 3.8 g/dL (ref 3.5–5.0)
Alkaline Phosphatase: 51 U/L (ref 38–126)
Anion gap: 12 (ref 5–15)
BUN: 10 mg/dL (ref 6–20)
CO2: 18 mmol/L — ABNORMAL LOW (ref 22–32)
Calcium: 9.4 mg/dL (ref 8.9–10.3)
Chloride: 107 mmol/L (ref 98–111)
Creatinine, Ser: 1.05 mg/dL (ref 0.61–1.24)
GFR, Estimated: >60 mL/min (ref >60)
Glucose, Bld: 141 mg/dL — ABNORMAL HIGH (ref 70–99)
Potassium: 4 mmol/L (ref 3.5–5.1)
Sodium: 137 mmol/L (ref 135–145)
Total Bilirubin: 0.9 mg/dL (ref 0.0–1.2)
Total Protein: 7.8 g/dL (ref 6.5–8.1)

## 2024-01-26 LAB — HEMOGLOBIN A1C
Hgb A1c MFr Bld: 5.7 % — ABNORMAL HIGH (ref 4.8–5.6)
Mean Plasma Glucose: 116.89 mg/dL

## 2024-01-26 LAB — CBC
HCT: 43.6 % (ref 39.0–52.0)
Hemoglobin: 14 g/dL (ref 13.0–17.0)
MCH: 26.6 pg (ref 26.0–34.0)
MCHC: 32.1 g/dL (ref 30.0–36.0)
MCV: 82.9 fL (ref 80.0–100.0)
Platelets: 197 K/uL (ref 150–400)
RBC: 5.26 MIL/uL (ref 4.22–5.81)
RDW: 13.7 % (ref 11.5–15.5)
WBC: 6.8 K/uL (ref 4.0–10.5)
nRBC: 0 % (ref 0.0–0.2)

## 2024-01-26 LAB — LYME DISEASE SEROLOGY W/REFLEX: Lyme Total Antibody EIA: NEGATIVE

## 2024-01-26 LAB — HIV ANTIBODY (ROUTINE TESTING W REFLEX): HIV Screen 4th Generation wRfx: NONREACTIVE

## 2024-01-26 LAB — GLUCOSE, CAPILLARY: Glucose-Capillary: 117 mg/dL — ABNORMAL HIGH (ref 70–99)

## 2024-01-26 LAB — PATHOLOGIST SMEAR REVIEW: Path Review: NEGATIVE

## 2024-01-26 MED ORDER — SODIUM CHLORIDE 0.9 % IV SOLN
100.0000 mg | Freq: Two times a day (BID) | INTRAVENOUS | Status: DC
  Administered 2024-01-26 – 2024-01-28 (×5): 100 mg via INTRAVENOUS
  Filled 2024-01-26 (×5): qty 100

## 2024-01-26 MED ORDER — STROKE: EARLY STAGES OF RECOVERY BOOK
Freq: Once | Status: AC
  Filled 2024-01-26: qty 1

## 2024-01-26 MED ORDER — GADOBUTROL 1 MMOL/ML IV SOLN
7.0000 mL | Freq: Once | INTRAVENOUS | Status: AC | PRN
Start: 2024-01-26 — End: 2024-01-26
  Administered 2024-01-26: 7 mL via INTRAVENOUS

## 2024-01-26 MED ORDER — HYDROMORPHONE HCL 1 MG/ML IJ SOLN
0.5000 mg | INTRAMUSCULAR | Status: DC | PRN
  Administered 2024-01-26 – 2024-01-28 (×5): 0.5 mg via INTRAVENOUS
  Filled 2024-01-26 (×6): qty 0.5

## 2024-01-26 MED ORDER — SODIUM CHLORIDE 0.9 % IV SOLN: INTRAVENOUS | Status: DC

## 2024-01-26 NOTE — Plan of Care (Signed)
   Problem: Education: Goal: Knowledge of General Education information will improve Description Including pain rating scale, medication(s)/side effects and non-pharmacologic comfort measures Outcome: Progressing

## 2024-01-26 NOTE — Progress Notes (Addendum)
 For in person interpreter please call  3/17- 3/21 8:30am- 5pm  [place] Ph # [phone] [facility] Ph # [phone] [facility] 1-to page for an interpreter)   For [place] onsite/in person [phone] (M-F 8:30a-5p)   Please call Shanda Bumps at [phone] for interpreter service.   Afterhours & weekends ONLY [place] Pager # [phone]

## 2024-01-26 NOTE — Consult Note (Signed)
 [facility]    Date of Admission:  01/25/2024     Total days of antibiotics 1   Vanc  Ceftriaxone  Acyclovir               Reason for Consult: Meningitis     Referring Provider: Rito Ehrlich Primary Care Provider: Pcp, No   Assessment: Samuel Warren is a 33 y.o.  deaf [place] speaking (Knows limited ASL)  male here for evaluation of severe headache, photosensitivity and neck pain that started driving back from hunting excursion outdoors on Friday 3/14 about 2-3 hours SW in [place].   Headache, Neck Pain -  Meningoencephalitis -  Outdoor Exposure -  CTA of head did not reveal any cause of his pain. No aneurysm noted. MRI/MRA of brain is pending read at the time of this evaluation. He seems to be answering most questions OK and fluidly, though there are some discrepancies we have given no available Jarai interpretor to help and I suspect some lapses with ASL given not primary language for him. No direct animal contact noted. No specific insect contact / bite noted.  LP was obtained and showed pleocytosis with corrected WBC of 18/19, neutrophil predominant (50%N), elevated glucose and protein (81/90 respectively) and negative meningoencephalitis pcr panel. Mild AST elevation. With outdoor exposure would recommend adding doxycycline 100 mg BID IV empirically to cover spotted fevers - will send titers today. No rash noted on skin. No sexual activity outside of marriage (though not clear he understood the question). HIV was negative on 4th gen screening.  -Stop acyclovir with ( - ) ME panel HSV PCR -Start doxycycline 100 mg BID IV  -continue vanc / ceftriaxone for now  Isolation Recommendations -  Negative ME panel - we can d/c droplet and continue standard precautions.    Deaf and ASL Not Primary Language -  For Estanisl[facility] Spire onsite/in person [phone] (M-F 8:30a-5p)  Afterhours & weekends ONLY Estanisl[facility] Spire Pager # [phone]      Plan: Follow up MRI read Stop  acyclovir  Start doxy  Continue vanc / ceftriaxone  Follow pending micro from CSF and blood    Principal Problem:   Acute encephalopathy Active Problems:   Headache   Neck pain   Leukocytosis    dexamethasone (DECADRON) injection  10 mg Intravenous Q6H   lidocaine  1 patch Transdermal Q24H   sodium chloride flush  3 mL Intravenous Q12H    HPI: Samuel Warren is a 33 y.o. male admitted from home with severe headache and neck pain.   3/14 he drove to wooded area in [place] about 2-3 hours SW in [place] to hunt feral hogs. On the drive home he had a bad headache. The next morning he had neck stiffness and ongoing headache and presented to the hospital. He reports no rash or skin changes. Photosensitivity +   He is married, has wife at home and 41 year old son. No one else in the house is reported to be ill.  He is [name] and moved to [place] around 2012. He knows a little bit of American Sign Language but native dialect, [employer] is not available for interpretation assistance. Parents are at the bedside with very limited [place]. 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  His nurse today reports he has been sleeping most of the day.  PPD placed at urgent care in 2020 was described to be negative.   CTA of  head did not reveal any cause of his pain. No aneurysm noted. MRI/MRA of brain is pending.   LP was obtained and showed pleocytosis with corrected WBC of 18/19, neutrophil predominant (50%N), elevated glucose and protein (81/90 respectively) and negative meningoencephalitis pcr panel. Mild AST elevation.    Review of Systems: Review of Systems  Constitutional:  Negative for chills and fever.  Eyes:  Positive for photophobia.  Musculoskeletal:  Positive for neck pain.  Neurological:  Positive for headaches. Negative for dizziness, sensory change, speech change, focal weakness and loss of consciousness.    Past Medical History:  Diagnosis Date   Deaf     Social History   Tobacco Use    Smoking status: Never   Smokeless tobacco: Never  Substance Use Topics   Alcohol use: Not Currently   Drug use: Not Currently    History reviewed. No pertinent family history. No Known Allergies  OBJECTIVE: Blood pressure 112/77, pulse (!) 59, temperature 98.4 F (36.9 C), temperature source Axillary, resp. rate 18, height 5\' 4"  (1.626 m), weight 72.2 kg, SpO2 96%.  Physical Exam Constitutional:      Appearance: He is well-developed. He is not ill-appearing.  Cardiovascular:     Rate and Rhythm: Normal rate and regular rhythm.  Pulmonary:     Effort: Pulmonary effort is normal.     Breath sounds: Normal breath sounds.  Abdominal:     General: Bowel sounds are normal. There is no distension.     Palpations: Abdomen is soft.     Tenderness: There is no abdominal tenderness.  Musculoskeletal:        General: No swelling. Normal range of motion.  Skin:    General: Skin is warm and dry.     Capillary Refill: Capillary refill takes less than 2 seconds.     Findings: No rash.  Neurological:     Mental Status: He is alert.     Lab Results Lab Results  Component Value Date   WBC 6.8 01/26/2024   HGB 14.0 01/26/2024   HCT 43.6 01/26/2024   MCV 82.9 01/26/2024   PLT 197 01/26/2024    Lab Results  Component Value Date   CREATININE 1.05 01/26/2024   BUN 10 01/26/2024   NA 137 01/26/2024   K 4.0 01/26/2024   CL 107 01/26/2024   CO2 18 (L) 01/26/2024    Lab Results  Component Value Date   ALT 120 (H) 01/26/2024   AST 51 (H) 01/26/2024   ALKPHOS 51 01/26/2024   BILITOT 0.9 01/26/2024     Microbiology: Recent Results (from the past 240 hours)  Resp panel by RT-PCR (RSV, Flu A&B, Covid) Anterior Nasal Swab     Status: None   Collection Time: 01/25/24 11:53 AM   Specimen: Anterior Nasal Swab  Result Value Ref Range Status   SARS Coronavirus 2 by RT PCR NEGATIVE NEGATIVE Final   Influenza A by PCR NEGATIVE NEGATIVE Final   Influenza B by PCR NEGATIVE NEGATIVE  Final    Comment: (NOTE) The Xpert Xpress SARS-CoV-2/FLU/RSV plus assay is intended as an aid in the diagnosis of influenza from Nasopharyngeal swab specimens and should not be used as a sole basis for treatment. Nasal washings and aspirates are unacceptable for Xpert Xpress SARS-CoV-2/FLU/RSV testing.  Fact Sheet for Patients: BloggerCourse.com  Fact Sheet for Healthcare Providers: SeriousBroker.it  This test is not yet approved or cleared by the [place] FDA and has been authorized for detection and/or diagnosis of SARS-CoV-2 by  FDA under an Emergency Use Authorization (EUA). This EUA will remain in effect (meaning this test can be used) for the duration of the COVID-19 declaration under Section 564(b)(1) of the Act, 21 U.S.C. section 360bbb-3(b)(1), unless the authorization is terminated or revoked.     Resp Syncytial Virus by PCR NEGATIVE NEGATIVE Final    Comment: (NOTE) Fact Sheet for Patients: BloggerCourse.com  Fact Sheet for Healthcare Providers: SeriousBroker.it  This test is not yet approved or cleared by the [place] FDA and has been authorized for detection and/or diagnosis of SARS-CoV-2 by FDA under an Emergency Use Authorization (EUA). This EUA will remain in effect (meaning this test can be used) for the duration of the COVID-19 declaration under Section 564(b)(1) of the Act, 21 U.S.C. section 360bbb-3(b)(1), unless the authorization is terminated or revoked.  Performed at [facility], [address], [place], [place] [address]   CSF culture w Gram Stain     Status: None (Preliminary result)   Collection Time: 01/25/24  2:46 PM   Specimen: CSF; Cerebrospinal Fluid  Result Value Ref Range Status   Specimen Description CSF  Final   Special Requests NONE  Final   Gram Stain   Final    WBC PRESENT,BOTH PMN AND MONONUCLEAR NO ORGANISMS  SEEN CYTOSPIN SMEAR    Culture   Final    NO GROWTH < 24 HOURS Performed at [facility], [address], [place], [place] [address]    Report Status PENDING  Incomplete  Culture, blood (Routine X 2)     Status: None (Preliminary result)   Collection Time: 01/25/24  2:47 PM   Specimen: BLOOD LEFT WRIST  Result Value Ref Range Status   Specimen Description BLOOD LEFT WRIST  Final   Special Requests   Final    BOTTLES DRAWN AEROBIC AND ANAEROBIC Blood Culture results may not be optimal due to an inadequate volume of blood received in culture bottles   Culture   Final    NO GROWTH < 24 HOURS Performed at [facility], [address], [place], [place] [address]    Report Status PENDING  Incomplete  Culture, blood (Routine X 2)     Status: None (Preliminary result)   Collection Time: 01/25/24  2:52 PM   Specimen: BLOOD  Result Value Ref Range Status   Specimen Description BLOOD RIGHT ANTECUBITAL  Final   Special Requests   Final    BOTTLES DRAWN AEROBIC AND ANAEROBIC Blood Culture adequate volume   Culture   Final    NO GROWTH < 24 HOURS Performed at [facility], [address], [place], [place] [address]    Report Status PENDING  Incomplete    Rexene Alberts, MSN, NP-C [facility]  [email or web address] Pager: [phone] Office: [phone] [facility] Main Line: [phone] *Secure Chat Communication Welcome

## 2024-01-26 NOTE — Progress Notes (Addendum)
 [facility] PROGRESS NOTE   Samuel Warren GNF:[id number] DOB: 1991/03/09 DOA: 01/25/2024  PCP: Pcp, No  Brief History:  33 y.o. male with medical history significant of deafness presented with severe headache and neck pain.  Patient was on a limited sign language.  He apparently developed symptoms after returning from a hunting trip.  No fever or chills was reported.  Patient was evaluated in the emergency department.  Patient underwent CT angiogram head and neck followed by lumbar puncture.  Subsequently hospitalized for further management.    Consultants: Discussed with neurosurgery this morning, Dr. Yetta Barre  Procedures: Lumbar puncture in the emergency department    Subjective/Interval History: There is significant language barrier and patient's review of deafness makes it very challenging to communicate.  But patient was able to point that his head and neck were hurting this morning.    Assessment/Plan:  Severe headache and neck pain/acute encephalopathy Patient apparently was noted to be confused yesterday after returning from a recent hunting trip and with onset of headache and neck pain.  Patient seems to have improved overnight. CT angiogram was somewhat degraded by motion but showed nonspecific findings raising concern for [facility].  There was also concern for meningitis.  No obvious tick bite history was obtained. He was noted to be afebrile.  WBC was only mildly elevated and noted to be normal this morning. He underwent lumbar puncture.  There is significant RBC in tubes 1 and 4.  Pinkish discoloration of the CSF was noted.  Glucose 80 total protein 91.  WBC 22 in both tubes were noted.  When corrected for RBC it appears that he has about 18-19 WBCs.  No organisms noted on Gram stain.  Cultures are pending. It appears that he may have had a traumatic tap but both the tubes showed significant RBC.  CSF abnormalities along with CTA findings was discussed with Dr. Yetta Barre with  neurosurgery.  His suspicion for [facility] is low.  He recommends MRI brain and MRA head.  The studies have been ordered.  Patient to be kept n.p.o. for now. Continue with broad-spectrum antibiotics including vancomycin and ceftriaxone acyclovir.  Also noted to be on dexamethasone.  ADDENDUM MRI brain and MRA head suggests meningitis. No aneurysms or SAH. Also revealed small strokes. Will request Neurology consult. Doxy added by ID.  Deafness This is chronic for him.  DVT Prophylaxis: Lovenox will be held.  SCDs for now Code Status: Full code Family Communication: Family at bedside was present. Disposition Plan: Home when improved  Status is: Inpatient Remains inpatient appropriate because: Concern for meningitis versus SAH      Medications: Scheduled:  dexamethasone (DECADRON) injection  10 mg Intravenous Q6H   lidocaine  1 patch Transdermal Q24H   sodium chloride flush  3 mL Intravenous Q12H   Continuous:  sodium chloride 125 mL/hr at 01/26/24 0049   acyclovir 720 mg (01/26/24 0601)   cefTRIAXone (ROCEPHIN)  IV Stopped (01/26/24 0245)   vancomycin 750 mg (01/26/24 0810)   QIO:[id number] **OR** acetaminophen, albuterol, HYDROmorphone (DILAUDID) injection, ondansetron **OR** ondansetron (ZOFRAN) IV  Antibiotics: Anti-infectives (From admission, onward)    Start     Dose/Rate Route Frequency Ordered Stop   01/26/24 0000  vancomycin (VANCOREADY) IVPB 750 mg/150 mL        750 mg 150 mL/hr over 60 Minutes Intravenous Every 8 hours 01/25/24 2003 02/08/24 2359   01/25/24 2300  acyclovir (ZOVIRAX) 720 mg in dextrose 5 % 250 mL IVPB  10 mg/kg  72.2 kg 264.4 mL/hr over 60 Minutes Intravenous Every 8 hours 01/25/24 2004     01/25/24 1500  vancomycin (VANCOCIN) IVPB 1000 mg/200 mL premix  Status:  Discontinued        1,000 mg 200 mL/hr over 60 Minutes Intravenous  Once 01/25/24 1447 01/25/24 1448   01/25/24 1500  cefTRIAXone (ROCEPHIN) 2 g in sodium chloride 0.9 % 100 mL IVPB         2 g 200 mL/hr over 30 Minutes Intravenous Every 12 hours 01/25/24 1447 02/08/24 1459   01/25/24 1500  vancomycin (VANCOREADY) IVPB 1250 mg/250 mL        1,250 mg 166.7 mL/hr over 90 Minutes Intravenous  Once 01/25/24 1450 01/25/24 1830   01/25/24 1500  acyclovir (ZOVIRAX) 600 mg in dextrose 5 % 100 mL IVPB        10 mg/kg  60 kg (Order-Specific) 112 mL/hr over 60 Minutes Intravenous Once 01/25/24 1456 01/25/24 1713       Objective:  Vital Signs  Vitals:   01/25/24 1938 01/25/24 2153 01/26/24 0208 01/26/24 0514  BP: 118/86 128/69 133/68 112/77  Pulse: 69 64 72 (!) 59  Resp: 19 17 20 18   Temp: 99.7 F (37.6 C) 99.5 F (37.5 C) 98.8 F (37.1 C) 98.4 F (36.9 C)  TempSrc: Axillary Rectal Axillary Axillary  SpO2: 98% 97% 92% 96%  Weight:      Height:        Intake/Output Summary (Last 24 hours) at 01/26/2024 0933 Last data filed at 01/26/2024 0851 Gross per 24 hour  Intake 1230 ml  Output 2325 ml  Net -1095 ml   Filed Weights   01/25/24 1804  Weight: 72.2 kg    General appearance: Awake alert.  In no distress Limited range of motion of the neck due to pain Resp: Clear to auscultation bilaterally.  Normal effort Cardio: S1-S2 is normal regular.  No S3-S4.  No rubs murmurs or bruit GI: Abdomen is soft.  Nontender nondistended.  Bowel sounds are present normal.  No masses organomegaly Extremities: No edema.  Full range of motion of lower extremities. Neurologic: Alert and oriented x3.  No focal neurological deficits.    Lab Results:  Data Reviewed: I have personally reviewed following labs and reports of the imaging studies  CBC: Recent Labs  Lab 01/25/24 1218 01/26/24 0349  WBC 11.8* 6.8  NEUTROABS 9.8*  --   HGB 14.7 14.0  HCT 46.6 43.6  MCV 84.1 82.9  PLT 194 197    Basic Metabolic Panel: Recent Labs  Lab 01/25/24 1218 01/26/24 0349  NA 137 137  K 3.8 4.0  CL 101 107  CO2 21* 18*  GLUCOSE 148* 141*  BUN 18 10  CREATININE 0.99 1.05   CALCIUM 10.1 9.4    GFR: Estimated Creatinine Clearance: 91.1 mL/min (by C-G formula based on SCr of 1.05 mg/dL).  Liver Function Tests: Recent Labs  Lab 01/26/24 0349  AST 51*  ALT 120*  ALKPHOS 51  BILITOT 0.9  PROT 7.8  ALBUMIN 3.8     Recent Results (from the past 240 hours)  Resp panel by RT-PCR (RSV, Flu A&B, Covid) Anterior Nasal Swab     Status: None   Collection Time: 01/25/24 11:53 AM   Specimen: Anterior Nasal Swab  Result Value Ref Range Status   SARS Coronavirus 2 by RT PCR NEGATIVE NEGATIVE Final   Influenza A by PCR NEGATIVE NEGATIVE Final   Influenza B by PCR NEGATIVE  NEGATIVE Final    Comment: (NOTE) The Xpert Xpress SARS-CoV-2/FLU/RSV plus assay is intended as an aid in the diagnosis of influenza from Nasopharyngeal swab specimens and should not be used as a sole basis for treatment. Nasal washings and aspirates are unacceptable for Xpert Xpress SARS-CoV-2/FLU/RSV testing.  Fact Sheet for Patients: BloggerCourse.com  Fact Sheet for Healthcare Providers: SeriousBroker.it  This test is not yet approved or cleared by the [place] FDA and has been authorized for detection and/or diagnosis of SARS-CoV-2 by FDA under an Emergency Use Authorization (EUA). This EUA will remain in effect (meaning this test can be used) for the duration of the COVID-19 declaration under Section 564(b)(1) of the Act, 21 U.S.C. section 360bbb-3(b)(1), unless the authorization is terminated or revoked.     Resp Syncytial Virus by PCR NEGATIVE NEGATIVE Final    Comment: (NOTE) Fact Sheet for Patients: BloggerCourse.com  Fact Sheet for Healthcare Providers: SeriousBroker.it  This test is not yet approved or cleared by the [place] FDA and has been authorized for detection and/or diagnosis of SARS-CoV-2 by FDA under an Emergency Use Authorization (EUA). This EUA  will remain in effect (meaning this test can be used) for the duration of the COVID-19 declaration under Section 564(b)(1) of the Act, 21 U.S.C. section 360bbb-3(b)(1), unless the authorization is terminated or revoked.  Performed at [facility], [address] [place], [place] [address]   CSF culture w Gram Stain     Status: None (Preliminary result)   Collection Time: 01/25/24  2:46 PM   Specimen: CSF; Cerebrospinal Fluid  Result Value Ref Range Status   Specimen Description CSF  Final   Special Requests NONE  Final   Gram Stain   Final    WBC PRESENT,BOTH PMN AND MONONUCLEAR NO ORGANISMS SEEN CYTOSPIN SMEAR Performed at [facility], [address], [place], [place] [address]    Culture PENDING  Incomplete   Report Status PENDING  Incomplete  Culture, blood (Routine X 2)     Status: None (Preliminary result)   Collection Time: 01/25/24  2:47 PM   Specimen: BLOOD LEFT WRIST  Result Value Ref Range Status   Specimen Description BLOOD LEFT WRIST  Final   Special Requests   Final    BOTTLES DRAWN AEROBIC AND ANAEROBIC Blood Culture results may not be optimal due to an inadequate volume of blood received in culture bottles   Culture   Final    NO GROWTH < 24 HOURS Performed at [facility], [address] [place], [place] [address]    Report Status PENDING  Incomplete  Culture, blood (Routine X 2)     Status: None (Preliminary result)   Collection Time: 01/25/24  2:52 PM   Specimen: BLOOD  Result Value Ref Range Status   Specimen Description BLOOD RIGHT ANTECUBITAL  Final   Special Requests   Final    BOTTLES DRAWN AEROBIC AND ANAEROBIC Blood Culture adequate volume   Culture   Final    NO GROWTH < 24 HOURS Performed at [facility], [address] [place], [place] [address]    Report Status PENDING  Incomplete      Radiology Studies: CT ANGIO HEAD NECK W WO CM Result Date: 01/25/2024 CLINICAL DATA:  Neuro deficit, acute, stroke suspected  EXAM: CT ANGIOGRAPHY HEAD AND NECK WITH AND WITHOUT CONTRAST TECHNIQUE: Multidetector CT imaging of the head and neck was performed using the standard protocol during bolus administration of intravenous  contrast. Multiplanar CT image reconstructions and MIPs were obtained to evaluate the vascular anatomy. Carotid stenosis measurements (when applicable) are obtained utilizing NASCET criteria, using the distal internal carotid diameter as the denominator. RADIATION DOSE REDUCTION: This exam was performed according to the departmental dose-optimization program which includes automated exposure control, adjustment of the mA and/or kV according to patient size and/or use of iterative reconstruction technique. CONTRAST:  75mL OMNIPAQUE IOHEXOL 350 MG/ML SOLN COMPARISON:  None Available. FINDINGS: CT HEAD FINDINGS Brain: No focal brain insult is seen. There are no visible subarachnoid spaces other than slit like ventricles, raising the possibility of generalized brain swelling or subacute subarachnoid hemorrhage. Vascular: No abnormal vascular finding. Skull: Negative Sinuses/Orbits: Clear/normal Other: None Review of the MIP images confirms the above findings CTA NECK FINDINGS Aortic arch: Normal Right carotid system: Common carotid artery widely patent to the bifurcation. Normal bifurcation. Normal cervical ICA. Left carotid system: Left carotid system similarly normal. Vertebral arteries: Both vertebral artery origins are widely patent. The left is dominant. Both vessels are normal through the cervical region to the foramen magnum. Skeleton: Negative Other neck: No significant finding. Upper chest: Lung apices are clear. Review of the MIP images confirms the above findings CTA HEAD FINDINGS Anterior circulation: Both internal carotid arteries are patent through the skull base and siphon regions. Intracranial detail is limited by motion and artifact from upper extremities. No evidence of vascular occlusion. No aneurysm  is identified, but detail is limited. Posterior circulation: Both vertebral arteries are patent through the foramen magnum. Right may terminate in PICA. Left vertebral artery supplies basilar artery. Basilar detail is limited by motion. Flow is present in both posterior cerebral arteries. Venous sinuses: Patent and normal. Anatomic variants: None significant. Review of the MIP images confirms the above findings IMPRESSION: 1. No focal brain insult is seen. There are no visible subarachnoid spaces other than slit like ventricles, raising the possibility of generalized brain swelling or subacute subarachnoid hemorrhage. 2. No evidence of arterial stenosis or occlusion in the neck. 3. Intracranial detail is limited by motion and artifact from upper extremities. No evidence of vascular occlusion. No aneurysm is identified, but detail is limited. Electronically Signed   By: Paulina Fusi M.D.   On: 01/25/2024 14:07       LOS: 1 day   [employer]  [facility] Pager on [email or web address]  01/26/2024, 9:33 AM

## 2024-01-26 NOTE — Consult Note (Signed)
 NEUROLOGY CONSULT NOTE   Date of service: January 26, 2024 Patient Name: Samuel Warren MRN:  [id number] DOB:  06-12-1991 Chief Complaint: "stroke on MRI" Requesting Provider: Osvaldo Shipper, MD  History of Present Illness  Samuel Warren is a 33 y.o. male with hx of deafness, admitted for evaluation of severe headache and neck pain-with limited history due to his deafness and limited knowledge of sign language, noted to have abnormal MRI for which neurology was consulted History obtained from chart review-Samuel Warren presented to the hospital on 01/25/2024 after returning from a hunting trip, soon after which Samuel Warren had a headache and vomiting.  Headache persisted along with neck stiffness noted on examination.  Samuel Warren had mild leukocytosis.  Spinal tap was performed which showed 1330 and 1350 red cells and tubes #1 and 4 as well as 22 WBCs in both tubes with 50 and 58% segmented neutrophils.  CSF glucose , CSF protein 91.  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 MRI and MRI of the brain were obtained at the recommendation of neurosurgery after the CSF results were discussed with them because of the increased red blood cell count.  Low suspicion for subarachnoid hemorrhage-further imaging with MRI of the brain without and with contrast and MRA of the head without contrast was performed that revealed diffuse leptomeningeal and pachymeningeal enhancement, sulcal FLAIR hyperintensity and suspected mild cerebral edema most concerning for meningoencephalitis.  There were 2 subcentimeter acute infarcts in the right frontal white matter.  7 mm cerebellar tonsillar ectopia and diffuse venous engorgement likely related to elevated ICP. As far as I can tell from history and limited information I could gather at bedside-patient has not had any focal deficits.  LKW: Prior to presentation-unclear Modified rankin score: Unable to reliably ascertain due to his deafness and minimal knowledge of  ASL and in person interpreter not available at this time IV Thrombolysis: Unclear last known well-likely outside the window EVT: Same as above  NIHSS components Score: Comment  1a Level of Conscious 0[x]  1[]  2[]  3[]      1b LOC Questions 0[]  1[]  2[x]       1c LOC Commands 0[x]  1[]  2[]       2 Best Gaze 0[x]  1[]  2[]       3 Visual 0[x]  1[]  2[]  3[]      4 Facial Palsy 0[x]  1[]  2[]  3[]      5a Motor Arm - left 0[x]  1[]  2[]  3[]  4[]  UN[]    5b Motor Arm - Right 0[x]  1[]  2[]  3[]  4[]  UN[]    6a Motor Leg - Left 0[x]  1[]  2[]  3[]  4[]  UN[]    6b Motor Leg - Right 0[x]  1[]  2[]  3[]  4[]  UN[]    7 Limb Ataxia 0[x]  1[]  2[]  3[]  UN[]     8 Sensory 0[x]  1[]  2[]  UN[]      9 Best Language 0[]  1[]  2[]  3[x]      10 Dysarthria 0[]  1[]  2[x]  UN[]      11 Extinct. and Inattention 0[x]  1[]  2[]       TOTAL: 7      ROS  Unable to ascertain reliably at this time  Past History   Past Medical History:  Diagnosis Date   Deaf     History reviewed. No pertinent surgical history.  Family History: History reviewed. No pertinent family history.  Social History  reports that Samuel Warren has never smoked. Samuel Warren has never used smokeless tobacco. Samuel Warren reports that Samuel Warren does not currently use alcohol. Samuel Warren reports that Samuel Warren does not currently use drugs.  No Known  Allergies  Medications   Current Facility-Administered Medications:    0.9 %  sodium chloride infusion, , Intravenous, Continuous, Osvaldo Shipper, MD, Last Rate: 75 mL/hr at 01/26/24 1534, New Bag at 01/26/24 1534   acetaminophen (TYLENOL) tablet 650 mg, 650 mg, Oral, Q6H PRN, 650 mg at 01/25/24 2329 **OR** acetaminophen (TYLENOL) suppository 650 mg, 650 mg, Rectal, Q6H PRN, Katrinka Blazing, Rondell A, MD   albuterol (PROVENTIL) (2.5 MG/3ML) 0.083% nebulizer solution 2.5 mg, 2.5 mg, Nebulization, Q6H PRN, Smith, Rondell A, MD   cefTRIAXone (ROCEPHIN) 2 g in sodium chloride 0.9 % 100 mL IVPB, 2 g, Intravenous, Q12H, Trifan, Kermit Balo, MD, Last Rate: 200 mL/hr at 01/26/24 1537, 2 g at 01/26/24  1537   dexamethasone (DECADRON) injection 10 mg, 10 mg, Intravenous, Q6H, Trifan, Kermit Balo, MD, 10 mg at 01/26/24 1810   doxycycline (VIBRAMYCIN) 100 mg in sodium chloride 0.9 % 250 mL IVPB, 100 mg, Intravenous, Q12H, Danelle Earthly, MD, Last Rate: 125 mL/hr at 01/26/24 1059, 100 mg at 01/26/24 1059   HYDROmorphone (DILAUDID) injection 0.5 mg, 0.5 mg, Intravenous, Q3H PRN, Osvaldo Shipper, MD, 0.5 mg at 01/26/24 1628   lidocaine (LIDODERM) 5 % 1 patch, 1 patch, Transdermal, Q24H, Meredith, Savannah F, PA-C, 1 patch at 01/26/24 1046   ondansetron (ZOFRAN) tablet 4 mg, 4 mg, Oral, Q6H PRN **OR** ondansetron (ZOFRAN) injection 4 mg, 4 mg, Intravenous, Q6H PRN, Smith, Rondell A, MD   sodium chloride flush (NS) 0.9 % injection 3 mL, 3 mL, Intravenous, Q12H, Smith, Rondell A, MD, 3 mL at 01/26/24 0810   vancomycin (VANCOREADY) IVPB 750 mg/150 mL, 750 mg, Intravenous, Q8H, Rollene Fare, RPH, Last Rate: 150 mL/hr at 01/26/24 1542, 750 mg at 01/26/24 1542  Vitals   Vitals:   01/25/24 2153 01/26/24 0208 01/26/24 0514 01/26/24 1603  BP: 128/69 133/68 112/77 121/81  Pulse: 64 72 (!) 59 63  Resp: 17 20 18    Temp: 99.5 F (37.5 C) 98.8 F (37.1 C) 98.4 F (36.9 C) 99.1 F (37.3 C)  TempSrc: Rectal Axillary Axillary Oral  SpO2: 97% 92% 96% 98%  Weight:      Height:        Body mass index is 27.32 kg/m.  Physical Exam  General Well-developed well-nourished man in no acute distress HEENT: Normocephalic atraumatic, still has neck stiffness Lungs: Clear Cardiovascular: Regular rate rhythm Neurological exam Samuel Warren appears awake alert Unable to reliably ascertain his orientation Samuel Warren is able to mimic commands and follow commands as I act them out. Samuel Warren is totally nonverbal Cranial nerves II to XII appear intact Motor examination with no drift in any of the 4 extremities Sensation appears intact No gross dysmetria noted on coordination exam  Labs/Imaging/Neurodiagnostic studies   CBC:  Recent  Labs  Lab 01/25/24 1218 01/26/24 0349  WBC 11.8* 6.8  NEUTROABS 9.8*  --   HGB 14.7 14.0  HCT 46.6 43.6  MCV 84.1 82.9  PLT 194 197   Basic Metabolic Panel:  Lab Results  Component Value Date   NA 137 01/26/2024   K 4.0 01/26/2024   CO2 18 (L) 01/26/2024   GLUCOSE 141 (H) 01/26/2024   BUN 10 01/26/2024   CREATININE 1.05 01/26/2024   CALCIUM 9.4 01/26/2024   GFRNONAA >60 01/26/2024   CT angio Head and Neck with contrast(Personally reviewed): No focal brain insult seen.  No visible subarachnoid spaces other than slitlike ventricles raising possibility of generalized brain swelling or subacute SAH is also possibility.  Intracranial detail very limited  by motion artifact.  No evidence of large vessel occlusion.  No aneurysm identified.  MR Angio head without contrast and Carotid Duplex BL(Personally reviewed): No ELVO.  Negative MRA  MRI Brain with and without contrast (Personally reviewed): Diffuse venous engorgement with enlargement of medullary and cortical veins as well as distention of dural venous sinuses.  Absence of normal flow voids in the sigmoid sinuses likely reflects slow flow rather than thrombosis given postcontrast images show normal enhancement.  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  There were 2 subcentimeter acute infarcts in the right frontal white matter.  7 mm of cerebellar tonsillar ectopia and diffuse venous engorgement likely related to elevated ICP.  CSF:  Meningitis encephalitis panel negative CSF culture negative thus far Tickborne labs-per ID-pending  ASSESSMENT   Samuel Warren is a 33 y.o. 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  Initial imaging and CSF concerning for possible subarachnoid  hemorrhage as well but review of the CT angio head and neck and MRA angio of the head does not reveal any aneurysm.  The appearance is more concerning for venous congestion-although normal gadolinium enhancement almost precludes dural venous sinus thrombus but the vascular congestion appears to be causing cerebral edema and the strokes are likely related to the same process.  At this point I think the mainstay of management would be identification of the causative organism and treatment.  The stroke is likely incidental but I would recommend completing the full stroke workup so as to not miss another etiology.  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 I would recommend continuing steroids and antibiotics per ID. I would also recommend getting a 2D echocardiogram and if anything abnormal detected, may want to consider TEE, although low suspicion for endocarditis. Therapy assessments No need for permissive hypertension-goal blood pressure normotension Stroke team to follow with you Plan d/w Dr. Loney Loh - on call [facility] hospitalist ______________________________________________________________________    Signed, Samuel Dikes, MD [facility]

## 2024-01-26 NOTE — Progress Notes (Signed)
 Patient ID: Samuel Warren, male   DOB: [birth year], 33 y.o.   MRN: [id number] I spoke with his physician this morning who gave me the patient's history.  Recommended MRI/MRA of the brain.  This has been reviewed as well as the report.  I agree with the report.  It is more concerning for meningeal encephalitis than it is subarachnoid hemorrhage.  There is no aneurysm identified.  Would argue against arteriogram negative subarachnoid hemorrhage.  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  I do not see anything neurosurgical at this time but would recommend neurology consult.  Please call if I can be of further assistance.

## 2024-01-27 ENCOUNTER — Inpatient Hospital Stay (HOSPITAL_COMMUNITY)

## 2024-01-27 DIAGNOSIS — E785 Hyperlipidemia, unspecified: Secondary | ICD-10-CM | POA: Diagnosis not present

## 2024-01-27 DIAGNOSIS — I639 Cerebral infarction, unspecified: Secondary | ICD-10-CM

## 2024-01-27 DIAGNOSIS — G039 Meningitis, unspecified: Secondary | ICD-10-CM

## 2024-01-27 DIAGNOSIS — R29705 NIHSS score 5: Secondary | ICD-10-CM | POA: Diagnosis not present

## 2024-01-27 DIAGNOSIS — I6389 Other cerebral infarction: Secondary | ICD-10-CM | POA: Diagnosis not present

## 2024-01-27 LAB — ECHOCARDIOGRAM COMPLETE
Area-P 1/2: 3.83 cm2
Height: 64 in
S' Lateral: 3 cm
Weight: 2546.75 [oz_av]

## 2024-01-27 LAB — RPR: RPR Ser Ql: NONREACTIVE

## 2024-01-27 LAB — RAPID URINE DRUG SCREEN, HOSP PERFORMED
Amphetamines: NOT DETECTED
Barbiturates: NOT DETECTED
Benzodiazepines: NOT DETECTED
Cocaine: NOT DETECTED
Opiates: NOT DETECTED
Tetrahydrocannabinol: NOT DETECTED

## 2024-01-27 LAB — LIPID PANEL
Cholesterol: 346 mg/dL — ABNORMAL HIGH (ref 0–200)
HDL: 47 mg/dL (ref >40)
LDL Cholesterol: 279 mg/dL — ABNORMAL HIGH (ref 0–99)
Total CHOL/HDL Ratio: 7.4 ratio
Triglycerides: 99 mg/dL (ref <150)
VLDL: 20 mg/dL (ref 0–40)

## 2024-01-27 LAB — HEPATITIS PANEL, ACUTE
HCV Ab: NONREACTIVE
Hep A IgM: NONREACTIVE
Hep B C IgM: NONREACTIVE
Hepatitis B Surface Ag: NONREACTIVE

## 2024-01-27 LAB — SPOTTED FEVER GROUP ANTIBODIES
Spotted Fever Group IgG: <1:64 {titer}
Spotted Fever Group IgM: <1:64 {titer}

## 2024-01-27 LAB — MISC LABCORP TEST (SEND OUT)
LabCorp test name: 164672
Labcorp test code: 9985

## 2024-01-27 LAB — VDRL, CSF: VDRL Quant, CSF: NONREACTIVE

## 2024-01-27 MED ORDER — ATORVASTATIN CALCIUM 80 MG PO TABS
80.0000 mg | ORAL_TABLET | Freq: Every day | ORAL | Status: DC
  Administered 2024-01-28: 80 mg via ORAL
  Filled 2024-01-27: qty 1

## 2024-01-27 MED ORDER — ASPIRIN 81 MG PO TBEC
81.0000 mg | DELAYED_RELEASE_TABLET | Freq: Every day | ORAL | Status: DC
  Administered 2024-01-27 – 2024-01-29 (×3): 81 mg via ORAL
  Filled 2024-01-27 (×3): qty 1

## 2024-01-27 MED ORDER — ATORVASTATIN CALCIUM 40 MG PO TABS
40.0000 mg | ORAL_TABLET | Freq: Every day | ORAL | Status: DC
  Administered 2024-01-27: 40 mg via ORAL
  Filled 2024-01-27: qty 1

## 2024-01-27 NOTE — Progress Notes (Cosign Needed)
 [facility]  Date of Admission:  01/25/2024      Total days of antibiotics 2   Vanc  Ceftriaxone   Doxy 3/17            ASSESSMENT: Samuel Warren is a 33 y.o.  deaf [place] speaking (Knows limited ASL)  male here for evaluation of severe headache, photosensitivity and neck pain that started driving back from hunting excursion outdoors on Friday 3/14 about 2-3 hours SW in [place].    Headache, Neck Pain -  Meningoencephalitis -  Outdoor Exposure -  LP with 22 WBC (50% N), in setting of >1200 RBC, Protein 91, Glu 80 with negative meningoencephalitis biofire panel - CTA of head did not reveal any cause hemorrhage. No aneurysm noted. 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  He is improved today and more awake and conversant. Afebrile. He does not remember anything about yesterday. We can now clarify he was experiencing viral URI symptoms prior to onset of escalating headaches, confusion and neck pain. Most suspect this is a viral process. No new symptoms. Did very well with PT today and no deficits noted. UDS negative. RPR pending, HIV 4th gen negative.  Discussed the limitations with the serologies for tick/vector illnesses. Would be more helpful to re-assess them after 4 weeks to more definitively rule out. Not C/W Lyme. Anaplasma, Erlichia, RMSF titers pending and will be some time before they come back. Would support continuing with 7-10d course of doxycycline to cover empirically. Steroids can probably be tapered for headache comfort.  -Continue doxycycline 100 mg BID IV - pending swallow study may be able to switch to oral tomorrow given improvement.  -continue vanc / ceftriaxone for now - likely D.C soon if CSF cultures remain negative.  -continue supportive care  -can probably taper off steroids for headache comfort  Transaminitis -  FU RPR, CMP trends and add hepatitis panel   CVA -  Neurology following. Likely this  was incidental and outside of current problem. TTE negative. Unless neurology feels strongly for TEE I don't think we need it from ID perspective given no positive blood cultures or csf cultures at this time.  LDL 279, A1C 5.7%  Isolation Recommendations -  Negative ME panel - we can d/c droplet and continue standard precautions. D/W nursing team today. Orders D/C'd. Discussed with family and patient at bedside with interpretors.     Deaf and ASL Not Primary Language -  For Samuel Warren onsite/in person [phone] (M-F 8:30a-5p)  Afterhours & weekends ONLY Samuel Warren Pager # [phone]     PLAN: Continue doxy Watch csf cultures  Watch blood cultures  Acute hepatitis panel ordered  FU RPR    Principal Problem:   Acute encephalopathy Active Problems:   Headache   Neck pain   Leukocytosis    atorvastatin  40 mg Oral Daily   dexamethasone (DECADRON) injection  10 mg Intravenous Q6H   lidocaine  1 patch Transdermal Q24H   sodium chloride flush  3 mL Intravenous Q12H    SUBJECTIVE: ASL and Samuel Warren interpretors present with family to help.  Samuel Warren can read / write in English (not complex discussion but simple questions). He actually only communicates with his family in the form of gestures and informal body language.   In discussion with Samuel Warren further, he reports that prior to the onset of his headaches he was suffering from URI with sore throat, runny nose symptoms with  intermittent dull headaches at that time. He had no prior hunting / outdoor exposure prior to his trip on Friday March 14th when headache and neck symptoms escalated.   Review of Systems: ROS  No Known Allergies  OBJECTIVE: Vitals:   01/26/24 2319 01/27/24 0330 01/27/24 0732 01/27/24 1105  BP: 103/60 (!) 94/56 94/74 125/83  Pulse: 60 60 62 62  Resp: 17 19 19  (!) 22  Temp: 98.4 F (36.9 C) 98 F (36.7 C) 98.5 F (36.9 C) 98.5 F (36.9 C)  TempSrc: Oral Oral Oral Oral  SpO2: 97% 95% 94% 95%  Weight:       Height:       Body mass index is 27.32 kg/m.  Physical Exam Vitals and nursing note reviewed.  Constitutional:      Appearance: Normal appearance. He is not ill-appearing.  HENT:     Head: Normocephalic.     Mouth/Throat:     Mouth: Mucous membranes are moist.     Pharynx: Oropharynx is clear.  Eyes:     General: No scleral icterus. Cardiovascular:     Rate and Rhythm: Normal rate and regular rhythm.  Pulmonary:     Effort: Pulmonary effort is normal.  Musculoskeletal:        General: Normal range of motion.     Cervical back: Normal range of motion.  Skin:    Coloration: Skin is not jaundiced or pale.  Neurological:     Mental Status: He is alert and oriented to person, place, and time.     Cranial Nerves: No cranial nerve deficit or facial asymmetry.     Sensory: No sensory deficit.     Motor: No weakness.     Coordination: Coordination normal.     Gait: Gait normal.  Psychiatric:        Mood and Affect: Mood normal.        Judgment: Judgment normal.     Lab Results Lab Results  Component Value Date   WBC 6.8 01/26/2024   HGB 14.0 01/26/2024   HCT 43.6 01/26/2024   MCV 82.9 01/26/2024   PLT 197 01/26/2024    Lab Results  Component Value Date   CREATININE 1.05 01/26/2024   BUN 10 01/26/2024   NA 137 01/26/2024   K 4.0 01/26/2024   CL 107 01/26/2024   CO2 18 (L) 01/26/2024    Lab Results  Component Value Date   ALT 120 (H) 01/26/2024   AST 51 (H) 01/26/2024   ALKPHOS 51 01/26/2024   BILITOT 0.9 01/26/2024     Microbiology: Recent Results (from the past 240 hours)  Resp panel by RT-PCR (RSV, Flu A&B, Covid) Anterior Nasal Swab     Status: None   Collection Time: 01/25/24 11:53 AM   Specimen: Anterior Nasal Swab  Result Value Ref Range Status   SARS Coronavirus 2 by RT PCR NEGATIVE NEGATIVE Final   Influenza A by PCR NEGATIVE NEGATIVE Final   Influenza B by PCR NEGATIVE NEGATIVE Final    Comment: (NOTE) The Xpert Xpress SARS-CoV-2/FLU/RSV plus  assay is intended as an aid in the diagnosis of influenza from Nasopharyngeal swab specimens and should not be used as a sole basis for treatment. Nasal washings and aspirates are unacceptable for Xpert Xpress SARS-CoV-2/FLU/RSV testing.  Fact Sheet for Patients: BloggerCourse.com  Fact Sheet for Healthcare Providers: SeriousBroker.it  This test is not yet approved or cleared by the [place] FDA and has been authorized for detection and/or diagnosis of SARS-CoV-2 by  FDA under an Emergency Use Authorization (EUA). This EUA will remain in effect (meaning this test can be used) for the duration of the COVID-19 declaration under Section 564(b)(1) of the Act, 21 U.S.C. section 360bbb-3(b)(1), unless the authorization is terminated or revoked.     Resp Syncytial Virus by PCR NEGATIVE NEGATIVE Final    Comment: (NOTE) Fact Sheet for Patients: BloggerCourse.com  Fact Sheet for Healthcare Providers: SeriousBroker.it  This test is not yet approved or cleared by the [place] FDA and has been authorized for detection and/or diagnosis of SARS-CoV-2 by FDA under an Emergency Use Authorization (EUA). This EUA will remain in effect (meaning this test can be used) for the duration of the COVID-19 declaration under Section 564(b)(1) of the Act, 21 U.S.C. section 360bbb-3(b)(1), unless the authorization is terminated or revoked.  Performed at [facility], [address], [place], [place] [address]   CSF culture w Gram Stain     Status: None (Preliminary result)   Collection Time: 01/25/24  2:46 PM   Specimen: CSF; Cerebrospinal Fluid  Result Value Ref Range Status   Specimen Description CSF  Final   Special Requests NONE  Final   Gram Stain   Final    WBC PRESENT,BOTH PMN AND MONONUCLEAR NO ORGANISMS SEEN CYTOSPIN SMEAR    Culture   Final    NO GROWTH 2 DAYS Performed  at [facility], [address], [place], [place] [address]    Report Status PENDING  Incomplete  Culture, blood (Routine X 2)     Status: None (Preliminary result)   Collection Time: 01/25/24  2:47 PM   Specimen: BLOOD LEFT WRIST  Result Value Ref Range Status   Specimen Description BLOOD LEFT WRIST  Final   Special Requests   Final    BOTTLES DRAWN AEROBIC AND ANAEROBIC Blood Culture results may not be optimal due to an inadequate volume of blood received in culture bottles   Culture   Final    NO GROWTH 2 DAYS Performed at [facility], [address] [place], [place] [address]    Report Status PENDING  Incomplete  Culture, blood (Routine X 2)     Status: None (Preliminary result)   Collection Time: 01/25/24  2:52 PM   Specimen: BLOOD  Result Value Ref Range Status   Specimen Description BLOOD RIGHT ANTECUBITAL  Final   Special Requests   Final    BOTTLES DRAWN AEROBIC AND ANAEROBIC Blood Culture adequate volume   Culture   Final    NO GROWTH 2 DAYS Performed at [facility], [address], [place], [place] [address]    Report Status PENDING  Incomplete     Rexene Alberts, MSN, NP-C [facility]  [email or web address] Pager: [phone] Office: [phone] [facility] Main Line: [phone] *Secure Chat Communication Welcome    Total encounter time with patient, family and translators including professional time spent discussing the above findings and reviewing pertinent tests: 50 minutes

## 2024-01-27 NOTE — Plan of Care (Signed)
  Problem: Education: Goal: Knowledge of General Education information will improve Description: Including pain rating scale, medication(s)/side effects and non-pharmacologic comfort measures Outcome: Progressing   Problem: Health Behavior/Discharge Planning: Goal: Ability to manage health-related needs will improve Outcome: Progressing   Problem: Clinical Measurements: Goal: Respiratory complications will improve Outcome: Progressing   Problem: Nutrition: Goal: Adequate nutrition will be maintained Outcome: Progressing   Problem: Coping: Goal: Level of anxiety will decrease Outcome: Progressing   Problem: Self-Care: Goal: Ability to participate in self-care as condition permits will improve Outcome: Progressing   Problem: Nutrition: Goal: Risk of aspiration will decrease Outcome: Progressing Goal: Dietary intake will improve Outcome: Progressing   Problem: Clinical Measurements: Goal: Diagnostic test results will improve Outcome: Not Progressing   Problem: Self-Care: Goal: Ability to communicate needs accurately will improve Outcome: Not Progressing

## 2024-01-27 NOTE — Plan of Care (Signed)
  Problem: Education: Goal: Knowledge of General Education information will improve Description: Including pain rating scale, medication(s)/side effects and non-pharmacologic comfort measures Outcome: Progressing   Problem: Health Behavior/Discharge Planning: Goal: Ability to manage health-related needs will improve Outcome: Progressing   Problem: Clinical Measurements: Goal: Respiratory complications will improve Outcome: Progressing   Problem: Nutrition: Goal: Adequate nutrition will be maintained Outcome: Progressing   Problem: Coping: Goal: Level of anxiety will decrease Outcome: Progressing   Problem: Ischemic Stroke/TIA Tissue Perfusion: Goal: Complications of ischemic stroke/TIA will be minimized Outcome: Progressing   Problem: Self-Care: Goal: Ability to participate in self-care as condition permits will improve Outcome: Progressing   Problem: Nutrition: Goal: Risk of aspiration will decrease Outcome: Progressing Goal: Dietary intake will improve Outcome: Progressing   Problem: Clinical Measurements: Goal: Diagnostic test results will improve Outcome: Not Progressing   Problem: Education: Goal: Knowledge of disease or condition will improve Outcome: Not Progressing Goal: Knowledge of secondary prevention will improve (MUST DOCUMENT ALL) Outcome: Not Progressing Goal: Knowledge of patient specific risk factors will improve (DELETE if not current risk factor) Outcome: Not Progressing   Problem: Self-Care: Goal: Ability to communicate needs accurately will improve Outcome: Not Progressing

## 2024-01-27 NOTE — Evaluation (Signed)
 Occupational Therapy Evaluation Patient Details Name: Samuel Warren MRN: [id number] DOB: 28-Oct-1991 Today's Date: 01/27/2024   History of Present Illness   Pt is 33 yo male who presents on 01/25/24 with severe HA after hunting with 1 episode of emesis. Suspected viral meningitis. 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  PMH: deafness, Chiari malformation     Clinical Impressions This 33 yo male admitted with above presents to acute OT with PLOF of being totally independent with basic ADLs, IADLs, and working. Currently he is at a setup/S-min A level (mainly for ambulation due to IV pole/line). No further skilled OT needs, we will sign off.     If plan is discharge home, recommend the following:   A little help with walking and/or transfers;A little help with bathing/dressing/bathroom;Assist for transportation     Functional Status Assessment   Patient has had a recent decline in their functional status and demonstrates the ability to make significant improvements in function in a reasonable and predictable amount of time. (does not need any further skilled OT, just needs to be mobilized)     Equipment Recommendations   None recommended by OT      Precautions/Restrictions   Precautions Precautions: None Recall of Precautions/Restrictions: Intact Restrictions Weight Bearing Restrictions Per Provider Order: No     Mobility Bed Mobility Overal bed mobility: Needs Assistance Bed Mobility: Supine to Sit, Sit to Supine     Supine to sit: Supervision Sit to supine: Supervision   General bed mobility comments: no physical assist needed    Transfers Overall transfer level: Needs assistance Equipment used: 2 person hand held assist Transfers: Sit to/from Stand Sit to Stand: Min assist, +2 safety/equipment, +2 physical assistance           General transfer comment: with initial stand pt needed min A, maintained standing a few seconds and  quickly sat back down. However next time he got up he was given min A +2 (HHA) and was able to maintain standing to walk      Balance Overall balance assessment: Mild deficits observed, not formally tested                                         ADL either performed or assessed with clinical judgement   ADL                                         General ADL Comments: Overall at a setup/S level due to multiple lines and Min A-Min A +2 for ambulation; feel he will progress quickly once his pain gets better without need for follow up A     Vision Baseline Vision/History: 0 No visual deficits Ability to See in Adequate Light: 0 Adequate Patient Visual Report: No change from baseline              Pertinent Vitals/Pain Pain Assessment Pain Assessment: 0-10 Pain Score: 10-Worst pain ever Pain Location: HA, back of neck Pain Descriptors / Indicators: Aching, Headache Pain Intervention(s): Limited activity within patient's tolerance, Monitored during session, Repositioned, Patient requesting pain meds-RN notified, RN gave pain meds during session     Extremity/Trunk Assessment Upper Extremity Assessment Upper Extremity Assessment: Overall WFL for tasks assessed   Lower Extremity Assessment Lower Extremity Assessment:  Generalized weakness (grossly 4/5 BLE's with no knee buckling in standing)   Cervical / Trunk Assessment Cervical / Trunk Assessment: Other exceptions Cervical / Trunk Exceptions: forward head   Communication Communication Communication: Impaired Factors Affecting Communication: Hearing impaired   Cognition Arousal: Alert Behavior During Therapy: WFL for tasks assessed/performed                                 Following commands: Impaired (due to limited communication with pt's ASL) Following commands impaired: Follows one step commands with increased time     Cueing  General Comments   Cueing Techniques:  Gestural cues;Visual cues  VSS. Pt countenance much brighter after pain meds given, was obvious that pain control helped. Pt given pad of paper and pen to increase communication           Home Living Family/patient expects to be discharged to:: Private residence Living Arrangements: Parent;Other relatives Available Help at Discharge: Family;Available 24 hours/day Type of Home: House Home Access: Stairs to enter [employer] of Steps: 3 Entrance Stairs-Rails: Left;Right Home Layout: One level     Bathroom Shower/Tub: Chief Strategy Officer: Standard     Home Equipment: None   Additional Comments: lives with parents and siblings, 8 people total      Prior Functioning/Environment Prior Level of Function : Independent/Modified Independent;Working/employed             Mobility Comments: works at [employer] in [employer]ing [employer]. Does not drive but father does. Independent ADLs Comments: independent    OT Problem List: Impaired balance (sitting and/or standing);Pain        OT Goals(Current goals can be found in the care plan section)   Acute Rehab OT Goals Patient Stated Goal: for headache to be better      Co-evaluation PT/OT/SLP Co-Evaluation/Treatment: Yes Reason for Co-Treatment: For patient/therapist safety;To address functional/ADL transfers (need for multiple interpreters for initial evals) PT goals addressed during session: Mobility/safety with mobility;Balance OT goals addressed during session: ADL's and self-care;Strengthening/ROM      AM-PAC OT "6 Clicks" Daily Activity     Outcome Measure Help from another person eating meals?: None Help from another person taking care of personal grooming?: A Little Help from another person toileting, which includes using toliet, bedpan, or urinal?: A Little Help from another person bathing (including washing, rinsing, drying)?: A Little Help from another person to put on and taking off  regular upper body clothing?: A Little Help from another person to put on and taking off regular lower body clothing?: A Little 6 Click Score: 19   End of Session Equipment Utilized During Treatment: Gait belt Nurse Communication: Mobility status  Activity Tolerance: Patient tolerated treatment well Patient left: in bed;with call bell/phone within reach;with bed alarm set;with family/visitor present  OT Visit Diagnosis: Unsteadiness on feet (R26.81);Pain Pain - part of body:  (head and back of neck)                Time: 1610-9604 OT Time Calculation (min): 31 min Charges:  OT General Charges $OT Visit: 1 Visit OT Evaluation $OT Eval Moderate Complexity: 1 Mod  Samuel L. OT Acute Rehabilitation Services Office [phone]    Samuel Warren 01/27/2024, 1:23 PM

## 2024-01-27 NOTE — Evaluation (Signed)
 Physical Therapy Evaluation Patient Details Name: Samuel Warren MRN: [id number] DOB: July 17, 1991 Today's Date: 01/27/2024  History of Present Illness  Pt is 33 yo male who presents on 01/25/24 with severe HA after hunting with 1 episode of emesis. Suspected viral meningitis. 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  PMH: deafness, Chiari malformation  Clinical Impression  Pt admitted with above diagnosis. Pt from home with family where he works in [employer], does not drive (but father does), and is independent. ASL and [place] interpreters present. Pt with head and neck pain which is currently limiting his function. This seems to be affecting his mobility more than any true weakness. He presents as generally fatigued but strength equal R and L and pt able to ambulate with HHA and no knee buckling or overt LOB. Will follow acutely but will likely not need any PT or DME at d/c. Found end of session that pt is able to read and write basic English and was given pad of paper and pen.  Pt currently with functional limitations due to the deficits listed below (see PT Problem List). Pt will benefit from acute skilled PT to increase their independence and safety with mobility to allow discharge.           If plan is discharge home, recommend the following: Assist for transportation;Help with stairs or ramp for entrance;Assistance with cooking/housework   Can travel by private vehicle        Equipment Recommendations Other (comment) (TBD, likely none)  Recommendations for Other Services       Functional Status Assessment Patient has had a recent decline in their functional status and demonstrates the ability to make significant improvements in function in a reasonable and predictable amount of time.     Precautions / Restrictions Precautions Precautions: None Recall of Precautions/Restrictions: Intact Restrictions Weight Bearing Restrictions Per Provider Order: No       Mobility  Bed Mobility Overal bed mobility: Needs Assistance Bed Mobility: Supine to Sit, Sit to Supine     Supine to sit: Supervision Sit to supine: Supervision   General bed mobility comments: no physical assist needed    Transfers Overall transfer level: Needs assistance Equipment used: 2 person hand held assist Transfers: Sit to/from Stand Sit to Stand: Min assist, +2 safety/equipment, +2 physical assistance           General transfer comment: with initial stand pt needed min A, maintained standing a few seconds and quickly sat back down. However next time he got up he was given min A +2 (HHA) and was able to maintain standing to walk    Ambulation/Gait Ambulation/Gait assistance: Min assist, +2 physical assistance Gait Distance (Feet): 20 Feet Assistive device: 2 person hand held assist Gait Pattern/deviations: Step-through pattern, Decreased stride length, Drifts right/left Gait velocity: decreased Gait velocity interpretation: <1.8 ft/sec, indicate of risk for recurrent falls   General Gait Details: pt with no knee buckling, muldly unsteady but no full LOB. Appears generally fatigued less than truly weak. Also limited by head pain  Stairs            Wheelchair Mobility     Tilt Bed    Modified Rankin (Stroke Patients Only)       Balance Overall balance assessment: Mild deficits observed, not formally tested  Pertinent Vitals/Pain Pain Assessment Pain Assessment: 0-10 Pain Score: 10-Worst pain ever Pain Location: HA, back of neck Pain Descriptors / Indicators: Aching, Headache Pain Intervention(s): Limited activity within patient's tolerance, Monitored during session, Patient requesting pain meds-RN notified, RN gave pain meds during session    Home Living Family/patient expects to be discharged to:: Private residence Living Arrangements: Parent;Other relatives Available Help at  Discharge: Family;Available 24 hours/day Type of Home: House Home Access: Stairs to enter Entrance Stairs-Rails: Lawyer of Steps: 3   Home Layout: One level Home Equipment: None Additional Comments: lives with parents and siblings, 8 people total    Prior Function Prior Level of Function : Independent/Modified Independent;Working/employed             Mobility Comments: works at [employer] in [employer]. Does not drive but father does. Independent ADLs Comments: independent     Extremity/Trunk Assessment   Upper Extremity Assessment Upper Extremity Assessment: Defer to OT evaluation    Lower Extremity Assessment Lower Extremity Assessment: Generalized weakness (grossly 4/5 BLE's with no knee buckling in standing)    Cervical / Trunk Assessment Cervical / Trunk Assessment: Other exceptions Cervical / Trunk Exceptions: forward head  Communication   Communication Communication: Impaired Factors Affecting Communication: Hearing impaired    Cognition Arousal: Alert Behavior During Therapy: WFL for tasks assessed/performed   PT - Cognitive impairments: Difficult to assess Difficult to assess due to: Hard of hearing/deaf, Non-English speaking                     PT - Cognition Comments: pt is deaf and uses minimal ASL. ASL interpreter used to assist. Family speaks [place] Sp[place] and that interpreter was present to help assist with communication with mom and between pt and mom. His limited ASL makes is difficult to asses cognition but the more communication that occurred the better his cognition proved itself to be. Also found that he can read in write basic English Following commands: Impaired (by limited communication) Following commands impaired: Follows one step commands with increased time     Cueing Cueing Techniques: Gestural cues, Visual cues     General Comments General comments (skin integrity, edema, etc.): VSS. Pt  countenance much brighter after pain meds given, was obvious that pain control helped. Pt given pad of paper and pen to increase communication    Exercises     Assessment/Plan    PT Assessment Patient needs continued PT services  PT Problem List Decreased strength;Decreased activity tolerance;Decreased mobility;Pain       PT Treatment Interventions Gait training;Stair training;Functional mobility training;Therapeutic activities;Therapeutic exercise;Balance training;Patient/family education    PT Goals (Current goals can be found in the Care Plan section)  Acute Rehab PT Goals Patient Stated Goal: return home PT Goal Formulation: With patient Time For Goal Achievement: 02/10/24 Potential to Achieve Goals: Good    Frequency Min 2X/week     Co-evaluation PT/OT/SLP Co-Evaluation/Treatment: Yes Reason for Co-Treatment: Complexity of the patient's impairments (multi-system involvement);Other (comment);For patient/therapist safety (need for multiple interpreters for initial evals) PT goals addressed during session: Mobility/safety with mobility;Balance         AM-PAC PT "6 Clicks" Mobility  Outcome Measure Help needed turning from your back to your side while in a flat bed without using bedrails?: None Help needed moving from lying on your back to sitting on the side of a flat bed without using bedrails?: None Help needed moving to and from a bed to a chair (including a  wheelchair)?: A Little Help needed standing up from a chair using your arms (e.g., wheelchair or bedside chair)?: A Little Help needed to walk in hospital room?: A Little Help needed climbing 3-5 steps with a railing? : A Little 6 Click Score: 20    End of Session Equipment Utilized During Treatment: Gait belt Activity Tolerance: Patient tolerated treatment well Patient left: in bed;with call bell/phone within reach;with nursing/sitter in room;with family/visitor present Nurse Communication: Mobility status PT  Visit Diagnosis: Unsteadiness on feet (R26.81);Muscle weakness (generalized) (M62.81);Difficulty in walking, not elsewhere classified (R26.2);Pain Pain - part of body:  (neck, head)    Time: 8657-8469 PT Time Calculation (min) (ACUTE ONLY): 25 min   Charges:   PT Evaluation $PT Eval Moderate Complexity: 1 Mod   PT General Charges $$ ACUTE PT VISIT: 1 Visit         Lyanne Co, PT  Acute Rehab Services Secure chat preferred Office [phone]   Lawana Chambers Mackensey Bolte 01/27/2024, 12:33 PM

## 2024-01-27 NOTE — Progress Notes (Addendum)
 STROKE TEAM PROGRESS NOTE   INTERIM HISTORY/SUBJECTIVE  Family at the bedside.  Sign language interpreter at the bedside and a language interpreter at the bedside to interpret for mom Patient sitting up in bed in no apparent distress MRI brain with 2 small acute infarcts in right frontal  OBJECTIVE  CBC    Component Value Date/Time   WBC 6.8 01/26/2024 0349   RBC 5.26 01/26/2024 0349   HGB 14.0 01/26/2024 0349   HCT 43.6 01/26/2024 0349   PLT 197 01/26/2024 0349   MCV 82.9 01/26/2024 0349   MCH 26.6 01/26/2024 0349   MCHC 32.1 01/26/2024 0349   RDW 13.7 01/26/2024 0349   LYMPHSABS 0.9 01/25/2024 1218   MONOABS 1.0 01/25/2024 1218   EOSABS 0.0 01/25/2024 1218   BASOSABS 0.0 01/25/2024 1218    BMET    Component Value Date/Time   NA 137 01/26/2024 0349   K 4.0 01/26/2024 0349   CL 107 01/26/2024 0349   CO2 18 (L) 01/26/2024 0349   GLUCOSE 141 (H) 01/26/2024 0349   BUN 10 01/26/2024 0349   CREATININE 1.05 01/26/2024 0349   CALCIUM 9.4 01/26/2024 0349   GFRNONAA >60 01/26/2024 0349    IMAGING past 24 hours ECHOCARDIOGRAM COMPLETE Result Date: 01/27/2024    ECHOCARDIOGRAM REPORT   Patient Name:   [name] Date of Exam: 01/27/2024 Medical Rec #:  [id number]    Height:       64.0 in Accession #:    [id number]   Weight:       159.2 lb Date of Birth:  08/27/1991    BSA:          1.775 m Patient Age:    32 years     BP:           94/74 mmHg Patient Gender: M            HR:           71 bpm. Exam Location:  Inpatient Procedure: 2D Echo, Cardiac Doppler and Color Doppler (Both Spectral and Color            Flow Doppler were utilized during procedure). Indications:    Stroke I63.9  History:        Patient has no prior history of Echocardiogram examinations.  Sonographer:    Harriette Bouillon RDCS Referring Phys: [id number] ASHISH ARORA IMPRESSIONS  1. Apical hypokinesis with overall mild LV dysfunction.  2. Left ventricular ejection fraction, by estimation, is 45 to 50%. The left ventricle  has mildly decreased function. The left ventricle demonstrates regional wall motion abnormalities (see scoring diagram/findings for description). Left ventricular diastolic parameters were normal.  3. Right ventricular systolic function is normal. The right ventricular size is normal.  4. The mitral valve is normal in structure. Trivial mitral valve regurgitation. No evidence of mitral stenosis.  5. The aortic valve is tricuspid. Aortic valve regurgitation is not visualized. No aortic stenosis is present. Comparison(s): No prior Echocardiogram. FINDINGS  Left Ventricle: Left ventricular ejection fraction, by estimation, is 45 to 50%. The left ventricle has mildly decreased function. The left ventricle demonstrates regional wall motion abnormalities. The left ventricular internal cavity size was normal in size. There is no left ventricular hypertrophy. Left ventricular diastolic parameters were normal. Right Ventricle: The right ventricular size is normal. Right ventricular systolic function is normal. Left Atrium: Left atrial size was normal in size. Right Atrium: Right atrial size was normal in size. Pericardium: There is no evidence of pericardial  effusion. Mitral Valve: The mitral valve is normal in structure. Trivial mitral valve regurgitation. No evidence of mitral valve stenosis. Tricuspid Valve: The tricuspid valve is normal in structure. Tricuspid valve regurgitation is trivial. No evidence of tricuspid stenosis. Aortic Valve: The aortic valve is tricuspid. Aortic valve regurgitation is not visualized. No aortic stenosis is present. Pulmonic Valve: The pulmonic valve was normal in structure. Pulmonic valve regurgitation is not visualized. No evidence of pulmonic stenosis. Aorta: The aortic root is normal in size and structure. Venous: The inferior vena cava was not well visualized. IAS/Shunts: The interatrial septum was not well visualized. Additional Comments: Apical hypokinesis with overall mild LV  dysfunction.  LEFT VENTRICLE PLAX 2D LVIDd:         5.10 cm   Diastology LVIDs:         3.00 cm   LV e' medial:    9.68 cm/s LV PW:         0.90 cm   LV E/e' medial:  8.3 LV IVS:        0.90 cm   LV e' lateral:   13.30 cm/s LVOT diam:     2.20 cm   LV E/e' lateral: 6.1 LV SV:         84 LV SV Index:   48 LVOT Area:     3.80 cm  RIGHT VENTRICLE RV S prime:     14.10 cm/s TAPSE (M-mode): 2.7 cm LEFT ATRIUM             Index        RIGHT ATRIUM           Index LA diam:        2.60 cm 1.46 cm/m   RA Area:     11.20 cm LA Vol (A2C):   44.3 ml 24.95 ml/m  RA Volume:   21.30 ml  12.00 ml/m LA Vol (A4C):   33.8 ml 19.04 ml/m LA Biplane Vol: 38.5 ml 21.68 ml/m  AORTIC VALVE LVOT Vmax:   105.00 cm/s LVOT Vmean:  67.000 cm/s LVOT VTI:    0.222 m  AORTA Ao Root diam: 3.40 cm Ao Asc diam:  3.40 cm MITRAL VALVE MV Area (PHT): 3.83 cm    SHUNTS MV Decel Time: 198 msec    Systemic VTI:  0.22 m MV E velocity: 80.70 cm/s  Systemic Diam: 2.20 cm MV A velocity: 86.50 cm/s MV E/A ratio:  0.93 Olga Millers MD Electronically signed by Olga Millers MD Signature Date/Time: 01/27/2024/11:58:50 AM    Final     Vitals:   01/26/24 2319 01/27/24 0330 01/27/24 0732 01/27/24 1105  BP: 103/60 (!) 94/56 94/74 125/83  Pulse: 60 60 62 62  Resp: 17 19 19  (!) 22  Temp: 98.4 F (36.9 C) 98 F (36.7 C) 98.5 F (36.9 C) 98.5 F (36.9 C)  TempSrc: Oral Oral Oral Oral  SpO2: 97% 95% 94% 95%  Weight:      Height:         PHYSICAL EXAM General:  Alert, well-nourished, well-developed patient in no acute distress Psych:  Mood and affect appropriate for situation CV: Regular rate and rhythm on monitor Respiratory:  Regular, unlabored respirations on room air GI: Abdomen soft and nontender   NEURO: Exam was hard even with sign language interpreter Mental Status: A wake and alert.  He is able to communicate with sign language interpreter, as well as right.  He is oriented to self, date of birth, and age, and  to  situation  Cranial Nerves:  II: PERRL. Visual fields full.  III, IV, VI: EOMI. Eyelids elevate symmetrically.  V: Sensation is intact to light touch and symmetrical to face.  VII: Face is symmetrical resting and smiling VIII: hearing intact to voice. IX, X: Palate elevates symmetrically. Phonation is normal.  NG:[id number] shrug 5/5. XII: tongue is midline without fasciculations. Motor: 5/5 strength to all muscle groups tested.  Tone: is normal and bulk is normal Sensation- Intact to light touch bilaterally. Extinction absent to light touch to DSS.   Coordination: FTN intact bilaterally, HKS: no ataxia in BLE.No drift.  Gait- deferred  Most Recent NIH  1a Level of Conscious.: 0 1b LOC Questions: 0 1c LOC Commands: 0 2 Best Gaze: 0 3 Visual: 0 4 Facial Palsy: 0 5a Motor Arm - left: 0 5b Motor Arm - Right: 0 6a Motor Leg - Left: 0 6b Motor Leg - Right: 0 7 Limb Ataxia: 0 8 Sensory: 0 9 Best Language: 3 10 Dysarthria: 0 11 Extinct. and Inatten.: 0 TOTAL: 5   ASSESSMENT/PLAN  [name] is a 33 y.o. male with hx of deafness, admitted for evaluation of severe headache and neck pain  noted to have abnormal MRI for which neurology was consulted  Spinal tap was performed which showed 1330 and 1350 red cells and tubes #1 and 4 as well as 22 WBCs in both tubes with 50 and 58% segmented neutrophils. CSF glucose , CSF protein 91. 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 Small vessel disease in the setting of CNS infection CTA head & neck no LVO MRI  Two subcentimeter acute infarcts in the right frontal white matter. MRA negative 2D Echo EF 45 to 50%.  Apical hypokinesis with overall mild LV dysfunction If blood culture positive, may consider TEE to rule out endocarditis LDL 279 HgbA1c 5.7 UDS negative VTE prophylaxis  -SCDs No antithrombotic prior to admission, now on ASA 81. Continue on discharge Therapy recommendations: None Disposition: Pending  Meningitis, likely viral Complained of headache and neck stiffness lumbar puncture obtained. There is significant RBC in tubes 1 and 4. Glucose 80 total protein 91. WBC 22 in both tubes were noted. When corrected for RBC it appears that he has about 18-19 WBCs. No organisms noted on Gram stain. Cultures are pending.  MRI showed Diffuse leptomeningeal and pachymeningeal enhancement, sulcal FLAIR hyperintensity, and suspected mild cerebral edema most concerning for meningoencephalitis.  Lyme disease, ME panel negative Blood culture pending, so far no gross On Rocephin, doxycycline and vancomycin for meningitis ID following  Hyperlipidemia Home meds: None LDL 279, goal < 70 Add atorvastatin 80 mg Continue statin at discharge  Other Stroke Risk Factors   Other Active Problems Deafness  Hospital day # 2   Gevena Mart DNP, ACNPC-AG  [facility]  ATTENDING NOTE: I reviewed above note and agree with the assessment and plan. Pt was seen and examined.   Sign language interpreter at bedside for patient, and language interpreter at bedside for mom and brother.  Patient lying bed, awake alert orientated to age, and month.  Still complaining of mild headache and neck pain, no focal deficit.  Patient symptoms consistent with meningitis, LP done and CSF more consistent with viral meningitis.  MRI showed diffuse leptomeningeal and pachymeningeal advancement, consistent with meningoencephalitis.  However, there is a 2-3 punctate DWI changes  at right frontal, likely pontine infarcts due to infectious vasculitis or small vessel disease in the setting of CNS infection.  Okay to add 81 aspirin.  Added Lipitor 80 for high LDL also.  ID on board, continue antibiotics per ID.  PT and OT no recommendation.  For detailed assessment and plan, please refer to  above/below as I have made changes wherever appropriate.   Neurology will sign off. Please call with questions. Pt will follow up at [facility] in about 4 weeks. Thanks for the consult.   Marvel Plan, MD PhD Stroke Neurology 01/27/2024 6:31 PM     To contact Stroke Continuity provider, please refer to [email or web address]. After hours, contact General Neurology

## 2024-01-27 NOTE — Progress Notes (Signed)
 [facility] PROGRESS NOTE   Samuel Warren WUJ:[id number] DOB: 11/16/1990 DOA: 01/25/2024  PCP: Pcp, No  Brief History:  33 y.o. male with medical history significant of deafness presented with severe headache and neck pain.  Patient was on a limited sign language.  He apparently developed symptoms after returning from a hunting trip.  No fever or chills was reported.  Patient was evaluated in the emergency department.  Patient underwent CT angiogram head and neck followed by lumbar puncture.  Subsequently hospitalized for further management.    Consultants: Neurosurgery.  Neurology.  Infectious disease  Procedures: Lumbar puncture in the emergency department    Subjective/Interval History: There is significant language barrier and patient's review of deafness makes it very challenging to communicate.   But patient was able to mention that his headache seems to be improving.  His mother is at the bedside.     Assessment/Plan:  Meningitis/acute encephalopathy Patient apparently was noted to be confused yesterday after returning from a recent hunting trip and with onset of headache and neck pain.  Patient seems to have improved overnight. CT angiogram was somewhat degraded by motion but showed nonspecific findings raising concern for [facility].  There was also concern for meningitis.  No obvious tick bite history was obtained. He was noted to be afebrile.  WBC was only mildly elevated and noted to be normal this morning. He underwent lumbar puncture.  There is significant RBC in tubes 1 and 4.  Pinkish discoloration of the CSF was noted.  Glucose 80 total protein 91.  WBC 22 in both tubes were noted.  When corrected for RBC it appears that he has about 18-19 WBCs.  No organisms noted on Gram stain.  Cultures are pending. Concern was for [facility] considering significant RBC.  After discussions with neurosurgery patient underwent MRI brain and MRA head.  No aneurysms or concern for [facility]  noted. Imaging studies did raise concern for meningitis.  Meningitis seems to be the most likely diagnosis at this time. Lyme disease total antibody was negative.  Spotted fever group antibodies pending. Meningitis/encephalitis panel was negative. Patient remains on vancomycin, ceftriaxone, doxycycline.  Also on dexamethasone. Mentation has improved.  Headache seems to be improving.  Acute stroke This was incidentally noted on MRI brain.  Neurology consulted.  Echocardiogram is pending.  LDL is 279.  HbA1c is 5.7.  No antiplatelet agents at this time per neurology.  Deafness This is chronic for him.  DVT Prophylaxis: Lovenox will be held.  SCDs for now Code Status: Full code Family Communication: Family at bedside was present. Disposition Plan: Home when improved  Status is: Inpatient Remains inpatient appropriate because: Meningitis    Medications: Scheduled:  dexamethasone (DECADRON) injection  10 mg Intravenous Q6H   lidocaine  1 patch Transdermal Q24H   sodium chloride flush  3 mL Intravenous Q12H   Continuous:  cefTRIAXone (ROCEPHIN)  IV 2 g (01/27/24 0308)   doxycycline (VIBRAMYCIN) IV 100 mg (01/27/24 1043)   vancomycin 750 mg (01/27/24 0912)   NFA:OZHYQMVHQIONG **OR** acetaminophen, albuterol, HYDROmorphone (DILAUDID) injection, ondansetron **OR** ondansetron (ZOFRAN) IV  Antibiotics: Anti-infectives (From admission, onward)    Start     Dose/Rate Route Frequency Ordered Stop   01/26/24 1115  doxycycline (VIBRAMYCIN) 100 mg in sodium chloride 0.9 % 250 mL IVPB        100 mg 125 mL/hr over 120 Minutes Intravenous Every 12 hours 01/26/24 1024     01/26/24 0000  vancomycin (VANCOREADY) IVPB 750 mg/150 mL  750 mg 150 mL/hr over 60 Minutes Intravenous Every 8 hours 01/25/24 2003 02/08/24 2359   01/25/24 2300  acyclovir (ZOVIRAX) 720 mg in dextrose 5 % 250 mL IVPB  Status:  Discontinued        10 mg/kg  72.2 kg 264.4 mL/hr over 60 Minutes Intravenous Every 8  hours 01/25/24 2004 01/26/24 1021   01/25/24 1500  vancomycin (VANCOCIN) IVPB 1000 mg/200 mL premix  Status:  Discontinued        1,000 mg 200 mL/hr over 60 Minutes Intravenous  Once 01/25/24 1447 01/25/24 1448   01/25/24 1500  cefTRIAXone (ROCEPHIN) 2 g in sodium chloride 0.9 % 100 mL IVPB        2 g 200 mL/hr over 30 Minutes Intravenous Every 12 hours 01/25/24 1447 02/08/24 1459   01/25/24 1500  vancomycin (VANCOREADY) IVPB 1250 mg/250 mL        1,250 mg 166.7 mL/hr over 90 Minutes Intravenous  Once 01/25/24 1450 01/25/24 1830   01/25/24 1500  acyclovir (ZOVIRAX) 600 mg in dextrose 5 % 100 mL IVPB        10 mg/kg  60 kg (Order-Specific) 112 mL/hr over 60 Minutes Intravenous Once 01/25/24 1456 01/25/24 1713       Objective:  Vital Signs  Vitals:   01/26/24 2319 01/27/24 0330 01/27/24 0732 01/27/24 1105  BP: 103/60 (!) 94/56 94/74 125/83  Pulse: 60 60 62 62  Resp: 17 19 19  (!) 22  Temp: 98.4 F (36.9 C) 98 F (36.7 C) 98.5 F (36.9 C) 98.5 F (36.9 C)  TempSrc: Oral Oral Oral Oral  SpO2: 97% 95% 94% 95%  Weight:      Height:        Intake/Output Summary (Last 24 hours) at 01/27/2024 1110 Last data filed at 01/27/2024 0500 Gross per 24 hour  Intake 1773.74 ml  Output 650 ml  Net 1123.74 ml   Filed Weights   01/25/24 1804  Weight: 72.2 kg    General appearance: Awake alert.  In no distress Resp: Clear to auscultation bilaterally.  Normal effort Cardio: S1-S2 is normal regular.  No S3-S4.  No rubs murmurs or bruit GI: Abdomen is soft.  Nontender nondistended.  Bowel sounds are present normal.  No masses organomegaly   Lab Results:  Data Reviewed: I have personally reviewed following labs and reports of the imaging studies  CBC: Recent Labs  Lab 01/25/24 1218 01/26/24 0349  WBC 11.8* 6.8  NEUTROABS 9.8*  --   HGB 14.7 14.0  HCT 46.6 43.6  MCV 84.1 82.9  PLT 194 197    Basic Metabolic Panel: Recent Labs  Lab 01/25/24 1218 01/26/24 0349  NA 137  137  K 3.8 4.0  CL 101 107  CO2 21* 18*  GLUCOSE 148* 141*  BUN 18 10  CREATININE 0.99 1.05  CALCIUM 10.1 9.4    GFR: Estimated Creatinine Clearance: 91.1 mL/min (by C-G formula based on SCr of 1.05 mg/dL).  Liver Function Tests: Recent Labs  Lab 01/26/24 0349  AST 51*  ALT 120*  ALKPHOS 51  BILITOT 0.9  PROT 7.8  ALBUMIN 3.8     Recent Results (from the past 240 hours)  Resp panel by RT-PCR (RSV, Flu A&B, Covid) Anterior Nasal Swab     Status: None   Collection Time: 01/25/24 11:53 AM   Specimen: Anterior Nasal Swab  Result Value Ref Range Status   SARS Coronavirus 2 by RT PCR NEGATIVE NEGATIVE Final   Influenza A by  PCR NEGATIVE NEGATIVE Final   Influenza B by PCR NEGATIVE NEGATIVE Final    Comment: (NOTE) The Xpert Xpress SARS-CoV-2/FLU/RSV plus assay is intended as an aid in the diagnosis of influenza from Nasopharyngeal swab specimens and should not be used as a sole basis for treatment. Nasal washings and aspirates are unacceptable for Xpert Xpress SARS-CoV-2/FLU/RSV testing.  Fact Sheet for Patients: BloggerCourse.com  Fact Sheet for Healthcare Providers: SeriousBroker.it  This test is not yet approved or cleared by the [place] FDA and has been authorized for detection and/or diagnosis of SARS-CoV-2 by FDA under an Emergency Use Authorization (EUA). This EUA will remain in effect (meaning this test can be used) for the duration of the COVID-19 declaration under Section 564(b)(1) of the Act, 21 U.S.C. section 360bbb-3(b)(1), unless the authorization is terminated or revoked.     Resp Syncytial Virus by PCR NEGATIVE NEGATIVE Final    Comment: (NOTE) Fact Sheet for Patients: BloggerCourse.com  Fact Sheet for Healthcare Providers: SeriousBroker.it  This test is not yet approved or cleared by the [place] FDA and has been authorized for  detection and/or diagnosis of SARS-CoV-2 by FDA under an Emergency Use Authorization (EUA). This EUA will remain in effect (meaning this test can be used) for the duration of the COVID-19 declaration under Section 564(b)(1) of the Act, 21 U.S.C. section 360bbb-3(b)(1), unless the authorization is terminated or revoked.  Performed at [facility], [address], [place], [place] [address]   CSF culture w Gram Stain     Status: None (Preliminary result)   Collection Time: 01/25/24  2:46 PM   Specimen: CSF; Cerebrospinal Fluid  Result Value Ref Range Status   Specimen Description CSF  Final   Special Requests NONE  Final   Gram Stain   Final    WBC PRESENT,BOTH PMN AND MONONUCLEAR NO ORGANISMS SEEN CYTOSPIN SMEAR    Culture   Final    NO GROWTH 2 DAYS Performed at [facility], [address] [place], [place] [address]    Report Status PENDING  Incomplete  Culture, blood (Routine X 2)     Status: None (Preliminary result)   Collection Time: 01/25/24  2:47 PM   Specimen: BLOOD LEFT WRIST  Result Value Ref Range Status   Specimen Description BLOOD LEFT WRIST  Final   Special Requests   Final    BOTTLES DRAWN AEROBIC AND ANAEROBIC Blood Culture results may not be optimal due to an inadequate volume of blood received in culture bottles   Culture   Final    NO GROWTH 2 DAYS Performed at [facility], [address], [place], [place] [address]    Report Status PENDING  Incomplete  Culture, blood (Routine X 2)     Status: None (Preliminary result)   Collection Time: 01/25/24  2:52 PM   Specimen: BLOOD  Result Value Ref Range Status   Specimen Description BLOOD RIGHT ANTECUBITAL  Final   Special Requests   Final    BOTTLES DRAWN AEROBIC AND ANAEROBIC Blood Culture adequate volume   Culture   Final    NO GROWTH 2 DAYS Performed at [facility], [address] [place], [place] [address]    Report Status PENDING  Incomplete      Radiology Studies: MR  ANGIO HEAD WO W CONTRAST Addendum Date: 01/26/2024 ADDENDUM REPORT: 01/26/2024 16:11 ADDENDUM: These results will be called to the ordering clinician or representative by the Radiologist Assistant, and communication  documented in the PACS or [employer]. Electronically Signed   By: Sebastian Ache M.D.   On: 01/26/2024 16:11   Result Date: 01/26/2024 CLINICAL DATA:  Headache, neuro deficit. Concern for subarachnoid hemorrhage. Headache and neck pain beginning after an outdoor hunting excursion. CSF analysis showing pleocytosis and elevated glucose and protein. EXAM: MRI HEAD WITHOUT AND WITH CONTRAST MRA HEAD WITHOUT CONTRAST TECHNIQUE: Multiplanar, multi-echo pulse sequences of the brain and surrounding structures were acquired without and with intravenous contrast. Angiographic images of the Circle of Willis were acquired using MRA technique without intravenous contrast. CONTRAST:  7mL GADAVIST GADOBUTROL 1 MMOL/ML IV SOLN COMPARISON:  CTA head and neck 01/25/2024 FINDINGS: MRI HEAD FINDINGS Brain: There is prominent leptomeningeal enhancement diffusely throughout the supratentorial and infratentorial compartments, and there is also mild diffuse pachymeningeal enhancement. Abnormal FLAIR hyperintensity is present within cerebral sulci. There are 2 subcentimeter foci of restricted diffusion within the right frontal white matter at the level of the centrum semiovale. Mild, relatively diffuse cerebral cortical edema is also suspected bilaterally. As noted on CT, the ventricles have a largely slit like appearance. No mass, midline shift, intracranial hemorrhage, or extra-axial fluid collection is identified. There is a partially empty sella. The cerebellar tonsils extend 7 mm below the foramen magnum. Vascular: Diffuse venous engorgement with enlargement of medullary and cortical veins as well as distension of the dural venous sinuses. Absence of normal flow voids in the sigmoid sinuses likely reflects slow flow  rather than thrombosis given normal enhancement. Skull and upper cervical spine: No suspicious marrow lesion. Sinuses/Orbits: Unremarkable orbits. Mild mucosal thickening in the paranasal sinuses. No significant mastoid fluid. Other: None. MRA HEAD FINDINGS Anterior circulation: The internal carotid arteries are widely patent from skull base to carotid termini. ACAs and MCAs are patent without evidence of a proximal branch occlusion or significant proximal stenosis. No aneurysm or AVM is identified. Posterior circulation: The included portions of the intracranial vertebral arteries are patent to the basilar with the left being strongly dominant. Patent PICA and SCA origins are visualized bilaterally. There patent posterior communicating arteries bilaterally. Both PCAs are patent without evidence of a significant proximal stenosis. No aneurysm or AVM is identified. Anatomic variants: None. IMPRESSION: 1. 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 2. 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 3. 7 mm of cerebellar tonsillar ectopia and diffuse venous engorgement likely related to elevated intracranial pressure. 4. Negative head MRA. Electronically Signed: By: Sebastian Ache M.D. On: 01/26/2024 16:01   MR BRAIN W WO CONTRAST Addendum Date: 01/26/2024 ADDENDUM REPORT: 01/26/2024 16:11 ADDENDUM: These results will be called to the ordering clinician or representative by the Radiologist Assistant, and communication documented in the PACS or [employer]. Electronically Signed   By: Sebastian Ache M.D.   On: 01/26/2024 16:11   Result Date: 01/26/2024 CLINICAL DATA:  Headache, neuro deficit. Concern for subarachnoid hemorrhage. Headache and neck pain beginning after an outdoor hunting excursion. CSF analysis showing pleocytosis and elevated glucose and protein. EXAM: MRI HEAD  WITHOUT AND WITH CONTRAST MRA HEAD WITHOUT CONTRAST TECHNIQUE: Multiplanar, multi-echo pulse sequences of the brain and surrounding structures were acquired without and with intravenous contrast. Angiographic images of the Circle of Willis were acquired using MRA technique without intravenous contrast. CONTRAST:  7mL GADAVIST GADOBUTROL 1 MMOL/ML IV SOLN COMPARISON:  CTA head and neck 01/25/2024 FINDINGS: MRI HEAD FINDINGS Brain: There is prominent leptomeningeal enhancement diffusely throughout  the supratentorial and infratentorial compartments, and there is also mild diffuse pachymeningeal enhancement. Abnormal FLAIR hyperintensity is present within cerebral sulci. There are 2 subcentimeter foci of restricted diffusion within the right frontal white matter at the level of the centrum semiovale. Mild, relatively diffuse cerebral cortical edema is also suspected bilaterally. As noted on CT, the ventricles have a largely slit like appearance. No mass, midline shift, intracranial hemorrhage, or extra-axial fluid collection is identified. There is a partially empty sella. The cerebellar tonsils extend 7 mm below the foramen magnum. Vascular: Diffuse venous engorgement with enlargement of medullary and cortical veins as well as distension of the dural venous sinuses. Absence of normal flow voids in the sigmoid sinuses likely reflects slow flow rather than thrombosis given normal enhancement. Skull and upper cervical spine: No suspicious marrow lesion. Sinuses/Orbits: Unremarkable orbits. Mild mucosal thickening in the paranasal sinuses. No significant mastoid fluid. Other: None. MRA HEAD FINDINGS Anterior circulation: The internal carotid arteries are widely patent from skull base to carotid termini. ACAs and MCAs are patent without evidence of a proximal branch occlusion or significant proximal stenosis. No aneurysm or AVM is identified. Posterior circulation: The included portions of the intracranial vertebral  arteries are patent to the basilar with the left being strongly dominant. Patent PICA and SCA origins are visualized bilaterally. There patent posterior communicating arteries bilaterally. Both PCAs are patent without evidence of a significant proximal stenosis. No aneurysm or AVM is identified. Anatomic variants: None. IMPRESSION: 1. 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 2. 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 3. 7 mm of cerebellar tonsillar ectopia and diffuse venous engorgement likely related to elevated intracranial pressure. 4. Negative head MRA. Electronically Signed: By: Sebastian Ache M.D. On: 01/26/2024 16:01   CT ANGIO HEAD NECK W WO CM Result Date: 01/25/2024 CLINICAL DATA:  Neuro deficit, acute, stroke suspected EXAM: CT ANGIOGRAPHY HEAD AND NECK WITH AND WITHOUT CONTRAST TECHNIQUE: Multidetector CT imaging of the head and neck was performed using the standard protocol during bolus administration of intravenous contrast. Multiplanar CT image reconstructions and MIPs were obtained to evaluate the vascular anatomy. Carotid stenosis measurements (when applicable) are obtained utilizing NASCET criteria, using the distal internal carotid diameter as the denominator. RADIATION DOSE REDUCTION: This exam was performed according to the departmental dose-optimization program which includes automated exposure control, adjustment of the mA and/or kV according to patient size and/or use of iterative reconstruction technique. CONTRAST:  75mL OMNIPAQUE IOHEXOL 350 MG/ML SOLN COMPARISON:  None Available. FINDINGS: CT HEAD FINDINGS Brain: No focal brain insult is seen. There are no visible subarachnoid spaces other than slit like ventricles, raising the possibility of generalized brain swelling or subacute subarachnoid hemorrhage. Vascular: No abnormal vascular finding.  Skull: Negative Sinuses/Orbits: Clear/normal Other: None Review of the MIP images confirms the above findings CTA NECK FINDINGS Aortic arch: Normal Right carotid system: Common carotid artery widely patent to the bifurcation. Normal bifurcation. Normal cervical ICA. Left carotid system: Left carotid system similarly normal. Vertebral arteries: Both vertebral artery origins are widely patent. The left is dominant. Both vessels are normal through the cervical region to the foramen magnum. Skeleton: Negative Other neck: No significant finding. Upper chest: Lung apices are clear. Review of the MIP images confirms the above findings CTA HEAD FINDINGS Anterior circulation: Both internal carotid arteries are patent through the skull base and siphon regions. Intracranial detail is limited by motion and artifact from  upper extremities. No evidence of vascular occlusion. No aneurysm is identified, but detail is limited. Posterior circulation: Both vertebral arteries are patent through the foramen magnum. Right may terminate in PICA. Left vertebral artery supplies basilar artery. Basilar detail is limited by motion. Flow is present in both posterior cerebral arteries. Venous sinuses: Patent and normal. Anatomic variants: None significant. Review of the MIP images confirms the above findings IMPRESSION: 1. No focal brain insult is seen. There are no visible subarachnoid spaces other than slit like ventricles, raising the possibility of generalized brain swelling or subacute subarachnoid hemorrhage. 2. No evidence of arterial stenosis or occlusion in the neck. 3. Intracranial detail is limited by motion and artifact from upper extremities. No evidence of vascular occlusion. No aneurysm is identified, but detail is limited. Electronically Signed   By: Paulina Fusi M.D.   On: 01/25/2024 14:07       LOS: 2 days   Samuel Warren Rito Ehrlich  [facility] Pager on [email or web address]  01/27/2024, 11:10 AM

## 2024-01-28 DIAGNOSIS — A86 Unspecified viral encephalitis: Secondary | ICD-10-CM | POA: Diagnosis not present

## 2024-01-28 DIAGNOSIS — M542 Cervicalgia: Secondary | ICD-10-CM | POA: Diagnosis not present

## 2024-01-28 DIAGNOSIS — G9340 Encephalopathy, unspecified: Secondary | ICD-10-CM | POA: Diagnosis not present

## 2024-01-28 LAB — CBC
HCT: 42.9 % (ref 39.0–52.0)
Hemoglobin: 13.9 g/dL (ref 13.0–17.0)
MCH: 26.5 pg (ref 26.0–34.0)
MCHC: 32.4 g/dL (ref 30.0–36.0)
MCV: 81.7 fL (ref 80.0–100.0)
Platelets: 237 10*3/uL (ref 150–400)
RBC: 5.25 MIL/uL (ref 4.22–5.81)
RDW: 13.6 % (ref 11.5–15.5)
WBC: 11.7 10*3/uL — ABNORMAL HIGH (ref 4.0–10.5)
nRBC: 0 % (ref 0.0–0.2)

## 2024-01-28 LAB — RESPIRATORY PANEL BY PCR

## 2024-01-28 LAB — COMPREHENSIVE METABOLIC PANEL
ALT: 203 U/L — ABNORMAL HIGH (ref 0–44)
AST: 75 U/L — ABNORMAL HIGH (ref 15–41)
Albumin: 3.5 g/dL (ref 3.5–5.0)
Alkaline Phosphatase: 49 U/L (ref 38–126)
Anion gap: 7 (ref 5–15)
BUN: 23 mg/dL — ABNORMAL HIGH (ref 6–20)
CO2: 22 mmol/L (ref 22–32)
Calcium: 9.2 mg/dL (ref 8.9–10.3)
Chloride: 108 mmol/L (ref 98–111)
Creatinine, Ser: 1.01 mg/dL (ref 0.61–1.24)
GFR, Estimated: >60 mL/min (ref >60)
Glucose, Bld: 165 mg/dL — ABNORMAL HIGH (ref 70–99)
Potassium: 3.7 mmol/L (ref 3.5–5.1)
Sodium: 137 mmol/L (ref 135–145)
Total Bilirubin: 1 mg/dL (ref 0.0–1.2)
Total Protein: 7.2 g/dL (ref 6.5–8.1)

## 2024-01-28 MED ORDER — DOXYCYCLINE HYCLATE 100 MG PO TABS
100.0000 mg | ORAL_TABLET | Freq: Two times a day (BID) | ORAL | Status: DC
  Administered 2024-01-28 – 2024-01-29 (×2): 100 mg via ORAL
  Filled 2024-01-28 (×2): qty 1

## 2024-01-28 MED ORDER — ORAL CARE MOUTH RINSE: 15.0000 mL | OROMUCOSAL | Status: DC | PRN

## 2024-01-28 NOTE — Progress Notes (Signed)
 Physical Therapy Treatment Patient Details Name: Samuel Warren MRN: [id number] DOB: Mar 12, 1991 Today's Date: 01/28/2024   History of Present Illness Pt is 33 yo male who presents on 01/25/24 with severe HA after hunting with 1 episode of emesis. Suspected viral meningitis. 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  PMH: deafness, Chiari malformation    PT Comments  Pt resting in bed on arrival and agreeable to session with continued progress towards acute goals. Pt limited in safe mobility by decreased activity tolerance, poor balance/postural reactions and headache. Pt requiring grossly CGA for transfers and gait with pt benefiting from RW support during gait for increased stability as pt needing min A with single UE support to maintain balance without AD support. Pt with some L drift in hall and bumping L door frame on entry to room, however pt able to self correct. Pt continues to benefit from skilled PT services to progress toward functional mobility goals.     If plan is discharge home, recommend the following: Assist for transportation;Help with stairs or ramp for entrance;Assistance with cooking/housework   Can travel by private vehicle        Equipment Recommendations  Other (comment) (TBD)    Recommendations for Other Services       Precautions / Restrictions Precautions Precautions: None Recall of Precautions/Restrictions: Intact Restrictions Weight Bearing Restrictions Per Provider Order: No     Mobility  Bed Mobility Overal bed mobility: Needs Assistance Bed Mobility: Supine to Sit, Sit to Supine     Supine to sit: Supervision Sit to supine: Supervision   General bed mobility comments: no physical assist needed    Transfers Overall transfer level: Needs assistance Equipment used: None, Rolling walker (2 wheels) Transfers: Sit to/from Stand Sit to Stand: Min assist, Contact guard assist           General transfer comment: with initial  stand pt needed min A to maintain balance, maintained standing a few seconds and quickly sat back down. able to complete x10 at end of session without LOB    Ambulation/Gait Ambulation/Gait assistance: Min assist, Contact guard assist Gait Distance (Feet): 150 Feet Assistive device: Rolling walker (2 wheels), IV Pole Gait Pattern/deviations: Step-through pattern, Decreased stride length, Drifts right/left Gait velocity: decreased     General Gait Details: initially attempting with single UE on IV pole, noted postrual sway and instability, much improved with RW for support, some drift L in hall and bumping door fram on L on return to room, short shuffling steps with low clearance   Stairs             Wheelchair Mobility     Tilt Bed    Modified Rankin (Stroke Patients Only)       Balance Overall balance assessment: Needs assistance               Single Leg Stance - Right Leg: 5 Single Leg Stance - Left Leg: 15                        Communication Communication Communication: Impaired Factors Affecting Communication: Hearing impaired  Cognition Arousal: Alert Behavior During Therapy: WFL for tasks assessed/performed   PT - Cognitive impairments: Difficult to assess Difficult to assess due to: Hard of hearing/deaf, Non-English speaking                     PT - Cognition Comments: pt is deaf and uses minimal  ASL. able to read and write some english Following commands: Impaired (due to limited communication with pt's ASL) Following commands impaired: Follows one step commands with increased time    Cueing Cueing Techniques: Gestural cues, Visual cues, Tactile cues  Exercises Other Exercises Other Exercises: serial sit<>stand x10    General Comments General comments (skin integrity, edema, etc.): VSS      Pertinent Vitals/Pain Pain Assessment Pain Assessment: Faces Faces Pain Scale: Hurts little more Pain Location: HA, back of  neck Pain Descriptors / Indicators: Aching, Headache Pain Intervention(s): Monitored during session, Limited activity within patient's tolerance    Home Living                          Prior Function            PT Goals (current goals can now be found in the care plan section) Acute Rehab PT Goals Patient Stated Goal: return home PT Goal Formulation: With patient Time For Goal Achievement: 02/10/24 Progress towards PT goals: Progressing toward goals    Frequency    Min 2X/week      PT Plan      Co-evaluation              AM-PAC PT "6 Clicks" Mobility   Outcome Measure  Help needed turning from your back to your side while in a flat bed without using bedrails?: None Help needed moving from lying on your back to sitting on the side of a flat bed without using bedrails?: None Help needed moving to and from a bed to a chair (including a wheelchair)?: A Little Help needed standing up from a chair using your arms (e.g., wheelchair or bedside chair)?: A Little Help needed to walk in hospital room?: A Little Help needed climbing 3-5 steps with a railing? : A Lot 6 Click Score: 19    End of Session Equipment Utilized During Treatment: Gait belt Activity Tolerance: Patient tolerated treatment well Patient left: in bed;with call bell/phone within reach;with nursing/sitter in room;with family/visitor present Nurse Communication: Mobility status PT Visit Diagnosis: Unsteadiness on feet (R26.81);Muscle weakness (generalized) (M62.81);Difficulty in walking, not elsewhere classified (R26.2);Pain Pain - part of body:  (neck, head)     Time: 1610-9604 PT Time Calculation (min) (ACUTE ONLY): 24 min  Charges:    $Gait Training: 23-37 mins PT General Charges $$ ACUTE PT VISIT: 1 Visit                     Mekia Dipinto R. PTA Acute Rehabilitation Services Office: [phone]   Catalina Antigua 01/28/2024, 11:24 AM

## 2024-01-28 NOTE — Plan of Care (Signed)

## 2024-01-28 NOTE — Plan of Care (Signed)
  Problem: Health Behavior/Discharge Planning: Goal: Ability to manage health-related needs will improve Outcome: Progressing   Problem: Clinical Measurements: Goal: Respiratory complications will improve Outcome: Progressing   Problem: Elimination: Goal: Will not experience complications related to urinary retention Outcome: Progressing   Problem: Safety: Goal: Ability to remain free from injury will improve Outcome: Progressing   Problem: Skin Integrity: Goal: Risk for impaired skin integrity will decrease Outcome: Progressing   Problem: Ischemic Stroke/TIA Tissue Perfusion: Goal: Complications of ischemic stroke/TIA will be minimized Outcome: Progressing   Problem: Coping: Goal: Will identify appropriate support needs Outcome: Progressing   Problem: Education: Goal: Knowledge of General Education information will improve Description: Including pain rating scale, medication(s)/side effects and non-pharmacologic comfort measures Outcome: Not Progressing   Problem: Clinical Measurements: Goal: Will remain free from infection Outcome: Not Progressing   Problem: Activity: Goal: Risk for activity intolerance will decrease Outcome: Not Progressing   Problem: Nutrition: Goal: Adequate nutrition will be maintained Outcome: Not Progressing   Problem: Pain Managment: Goal: General experience of comfort will improve and/or be controlled Outcome: Not Progressing   Problem: Education: Goal: Knowledge of disease or condition will improve Outcome: Not Progressing Goal: Knowledge of secondary prevention will improve (MUST DOCUMENT ALL) Outcome: Not Progressing Goal: Knowledge of patient specific risk factors will improve (DELETE if not current risk factor) Outcome: Not Progressing

## 2024-01-28 NOTE — Progress Notes (Addendum)
 PROGRESS NOTE    Shuayb Schepers  NFA:[id number] DOB: 02/01/1991 DOA: 01/25/2024 PCP: Pcp, No   Brief Narrative:  33 y.o. male with medical history significant of deafness presented with severe headache and neck pain after returning from a hunting trip.  On presentation, patient underwent CT angiogram of the head and neck followed by lumbar puncture.  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  Neurology and ID were consulted.  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  MRI as above.  Underwent LP as well: Cultures negative so far.  Headache improving. -No focal deficits. -Currently on IV Decadron as well as.  Incidental finding of acute infarcts in the right frontal white matter -Likely due to infectious vasculitis versus small vessel disease in the setting of CNS infection.  CTA head and neck showed no LVO -Echo showed EF of 45 to 50% -LDL 279.  Patient has been started on high intensity statin by neurology: Will reach out to neurology to see if statin can be discontinued because of increasing LFTs -A1c 5.7. -Continue aspirin 81 mg daily.  Outpatient follow-up with neurology. -Tolerating diet.  No PT/OT follow-up needed as per PT and OT  Deafness -Chronic  Leukocytosis -Mild  Acute metabolic acidosis -Resolved  Elevated LFTs -Possibly viral.  LFTs worsening.  Check hepatitis panel in AM.  If LFTs continue to worsen, we will check right upper quadrant ultrasound  DVT prophylaxis: SCDs Code Status: Full Family Communication: Brother at bedside Disposition Plan: Status is: Inpatient Remains inpatient appropriate because: Of severity of illness    Consultants:  Neurology/ID.  Prior hospitalist discussed CTA findings with Dr. Jones/neurosurgery because of initial suspicion of SAH  Procedures: As above  Antimicrobials:  Anti-infectives (From admission, onward)    Start     Dose/Rate Route Frequency Ordered Stop   01/26/24 1115  doxycycline (VIBRAMYCIN) 100 mg in sodium chloride 0.9 % 250 mL IVPB        100 mg 125 mL/hr over 120 Minutes Intravenous Every 12 hours 01/26/24 1024     01/26/24 0000  vancomycin (VANCOREADY) IVPB 750 mg/150 mL  Status:  Discontinued        750 mg 150 mL/hr over 60 Minutes Intravenous Every 8 hours 01/25/24 2003 01/28/24 1005   01/25/24 2300  acyclovir (ZOVIRAX) 720 mg in dextrose 5 % 250 mL IVPB  Status:  Discontinued        10 mg/kg  72.2 kg 264.4 mL/hr over 60 Minutes Intravenous Every 8 hours 01/25/24 2004 01/26/24 1021   01/25/24 1500  vancomycin (VANCOCIN) IVPB 1000 mg/200 mL premix  Status:  Discontinued        1,000 mg 200 mL/hr over 60 Minutes Intravenous  Once 01/25/24 1447 01/25/24 1448   01/25/24 1500  cefTRIAXone (ROCEPHIN) 2 g in sodium chloride 0.9 % 100 mL IVPB  Status:  Discontinued        2 g 200 mL/hr over 30 Minutes Intravenous Every 12 hours 01/25/24 1447 01/28/24 1005   01/25/24 1500  vancomycin (VANCOREADY) IVPB 1250 mg/250 mL        1,250 mg 166.7 mL/hr over 90 Minutes Intravenous  Once 01/25/24 1450 01/25/24 1830   01/25/24 1500  acyclovir (ZOVIRAX) 600 mg in dextrose  5 % 100 mL IVPB        10 mg/kg  60 kg (Order-Specific) 112 mL/hr over 60 Minutes Intravenous Once 01/25/24 1456 01/25/24 1713        Subjective: Patient seen and examined at bedside.  Brother present at bedside.  Headache improving.  No seizures, vomiting, chest pain reported.  Objective: Vitals:   01/27/24 2247 01/27/24 2327 01/28/24 0249 01/28/24 0752  BP: 113/73  118/73 111/78  Pulse: (!) 52 64 (!) 55 69  Resp: 16 15 19 18   Temp: 98.6 F (37 C)  98.9 F (37.2 C) 98.3 F (36.8 C)  TempSrc: Oral  Oral   SpO2:  93% 95% 93% 92%  Weight:      Height:        Intake/Output Summary (Last 24 hours) at 01/28/2024 1123 Last data filed at 01/28/2024 0900 Gross per 24 hour  Intake 1339.72 ml  Output 675 ml  Net 664.72 ml   Filed Weights   01/25/24 1804  Weight: 72.2 kg    Examination:  General exam: Appears calm and comfortable.  No distress. Respiratory system: Bilateral decreased breath sounds at bases Cardiovascular system: S1 & S2 heard, Rate controlled Gastrointestinal system: Abdomen is nondistended, soft and nontender. Normal bowel sounds heard. Extremities: No cyanosis, clubbing, edema    Data Reviewed: I have personally reviewed following labs and imaging studies  CBC: Recent Labs  Lab 01/25/24 1218 01/26/24 0349 01/28/24 0310  WBC 11.8* 6.8 11.7*  NEUTROABS 9.8*  --   --   HGB 14.7 14.0 13.9  HCT 46.6 43.6 42.9  MCV 84.1 82.9 81.7  PLT 194 197 237   Basic Metabolic Panel: Recent Labs  Lab 01/25/24 1218 01/26/24 0349 01/28/24 0310  NA 137 137 137  K 3.8 4.0 3.7  CL 101 107 108  CO2 21* 18* 22  GLUCOSE 148* 141* 165*  BUN 18 10 23*  CREATININE 0.99 1.05 1.01  CALCIUM 10.1 9.4 9.2   GFR: Estimated Creatinine Clearance: 93.9 mL/min (by C-G formula based on SCr of 1.01 mg/dL). Liver Function Tests: Recent Labs  Lab 01/26/24 0349 01/28/24 0310  AST 51* 75*  ALT 120* 203*  ALKPHOS 51 49  BILITOT 0.9 1.0  PROT 7.8 7.2  ALBUMIN 3.8 3.5   No results for input(s): "LIPASE", "AMYLASE" in the last 168 hours. No results for input(s): "AMMONIA" in the last 168 hours. Coagulation Profile: No results for input(s): "INR", "PROTIME" in the last 168 hours. Cardiac Enzymes: No results for input(s): "CKTOTAL", "CKMB", "CKMBINDEX", "TROPONINI" in the last 168 hours. BNP (last 3 results) No results for input(s): "PROBNP" in the last 8760 hours. HbA1C: Recent Labs    01/26/24 2047  HGBA1C 5.7*   CBG: Recent Labs  Lab 01/26/24 1524  GLUCAP 117*   Lipid  Profile: Recent Labs    01/27/24 0422  CHOL 346*  HDL 47  LDLCALC 279*  TRIG 99  CHOLHDL 7.4   Thyroid Function Tests: No results for input(s): "TSH", "T4TOTAL", "FREET4", "T3FREE", "THYROIDAB" in the last 72 hours. Anemia Panel: No results for input(s): "VITAMINB12", "FOLATE", "FERRITIN", "TIBC", "IRON", "RETICCTPCT" in the last 72 hours. Sepsis Labs: No results for input(s): "PROCALCITON", "LATICACIDVEN" in the last 168 hours.  Recent Results (from the past 240 hours)  Resp panel by RT-PCR (RSV, Flu A&B, Covid) Anterior Nasal Swab     Status: None   Collection Time: 01/25/24 11:53 AM   Specimen: Anterior Nasal Swab  Result Value Ref Range Status  SARS Coronavirus 2 by RT PCR NEGATIVE NEGATIVE Final   Influenza A by PCR NEGATIVE NEGATIVE Final   Influenza B by PCR NEGATIVE NEGATIVE Final    Comment: (NOTE) The Xpert Xpress SARS-CoV-2/FLU/RSV plus assay is intended as an aid in the diagnosis of influenza from Nasopharyngeal swab specimens and should not be used as a sole basis for treatment. Nasal washings and aspirates are unacceptable for Xpert Xpress SARS-CoV-2/FLU/RSV testing.  Fact Sheet for Patients: BloggerCourse.com  Fact Sheet for Healthcare Providers: SeriousBroker.it  This test is not yet approved or cleared by the [place] FDA and has been authorized for detection and/or diagnosis of SARS-CoV-2 by FDA under an Emergency Use Authorization (EUA). This EUA will remain in effect (meaning this test can be used) for the duration of the COVID-19 declaration under Section 564(b)(1) of the Act, 21 U.S.C. section 360bbb-3(b)(1), unless the authorization is terminated or revoked.     Resp Syncytial Virus by PCR NEGATIVE NEGATIVE Final    Comment: (NOTE) Fact Sheet for Patients: BloggerCourse.com  Fact Sheet for Healthcare Providers: SeriousBroker.it  This  test is not yet approved or cleared by the [place] FDA and has been authorized for detection and/or diagnosis of SARS-CoV-2 by FDA under an Emergency Use Authorization (EUA). This EUA will remain in effect (meaning this test can be used) for the duration of the COVID-19 declaration under Section 564(b)(1) of the Act, 21 U.S.C. section 360bbb-3(b)(1), unless the authorization is terminated or revoked.  Performed at [facility], [address], [place], [place] [address]   CSF culture w Gram Stain     Status: None (Preliminary result)   Collection Time: 01/25/24  2:46 PM   Specimen: CSF; Cerebrospinal Fluid  Result Value Ref Range Status   Specimen Description CSF  Final   Special Requests NONE  Final   Gram Stain   Final    WBC PRESENT,BOTH PMN AND MONONUCLEAR NO ORGANISMS SEEN CYTOSPIN SMEAR    Culture   Final    NO GROWTH 3 DAYS Performed at [facility], [address] [place], [place] [address]    Report Status PENDING  Incomplete  Culture, blood (Routine X 2)     Status: None (Preliminary result)   Collection Time: 01/25/24  2:47 PM   Specimen: BLOOD LEFT WRIST  Result Value Ref Range Status   Specimen Description BLOOD LEFT WRIST  Final   Special Requests   Final    BOTTLES DRAWN AEROBIC AND ANAEROBIC Blood Culture results may not be optimal due to an inadequate volume of blood received in culture bottles   Culture   Final    NO GROWTH 3 DAYS Performed at [facility], [address], [place], [place] [address]    Report Status PENDING  Incomplete  Culture, blood (Routine X 2)     Status: None (Preliminary result)   Collection Time: 01/25/24  2:52 PM   Specimen: BLOOD  Result Value Ref Range Status   Specimen Description BLOOD RIGHT ANTECUBITAL  Final   Special Requests   Final    BOTTLES DRAWN AEROBIC AND ANAEROBIC Blood Culture adequate volume   Culture   Final    NO GROWTH 3 DAYS Performed at [facility], [address],  [place], [place] [address]    Report Status PENDING  Incomplete         Radiology Studies: ECHOCARDIOGRAM COMPLETE Result Date: 01/27/2024    ECHOCARDIOGRAM REPORT   Patient Name:  [facility] Date of Exam: 01/27/2024 Medical Rec #:  [id number]    Height:       64.0 in Accession #:    [id number]   Weight:       159.2 lb Date of Birth:  Mar 03, 1991    BSA:          1.775 m Patient Age:    32 years     BP:           94/74 mmHg Patient Gender: M            HR:           71 bpm. Exam Location:  Inpatient Procedure: 2D Echo, Cardiac Doppler and Color Doppler (Both Spectral and Color            Flow Doppler were utilized during procedure). Indications:    Stroke I63.9  History:        Patient has no prior history of Echocardiogram examinations.  Sonographer:    Harriette Bouillon RDCS Referring Phys: [id number] ASHISH ARORA IMPRESSIONS  1. Apical hypokinesis with overall mild LV dysfunction.  2. Left ventricular ejection fraction, by estimation, is 45 to 50%. The left ventricle has mildly decreased function. The left ventricle demonstrates regional wall motion abnormalities (see scoring diagram/findings for description). Left ventricular diastolic parameters were normal.  3. Right ventricular systolic function is normal. The right ventricular size is normal.  4. The mitral valve is normal in structure. Trivial mitral valve regurgitation. No evidence of mitral stenosis.  5. The aortic valve is tricuspid. Aortic valve regurgitation is not visualized. No aortic stenosis is present. Comparison(s): No prior Echocardiogram. FINDINGS  Left Ventricle: Left ventricular ejection fraction, by estimation, is 45 to 50%. The left ventricle has mildly decreased function. The left ventricle demonstrates regional wall motion abnormalities. The left ventricular internal cavity size was normal in size. There is no left ventricular hypertrophy. Left ventricular diastolic parameters were normal. Right Ventricle: The right ventricular size is  normal. Right ventricular systolic function is normal. Left Atrium: Left atrial size was normal in size. Right Atrium: Right atrial size was normal in size. Pericardium: There is no evidence of pericardial effusion. Mitral Valve: The mitral valve is normal in structure. Trivial mitral valve regurgitation. No evidence of mitral valve stenosis. Tricuspid Valve: The tricuspid valve is normal in structure. Tricuspid valve regurgitation is trivial. No evidence of tricuspid stenosis. Aortic Valve: The aortic valve is tricuspid. Aortic valve regurgitation is not visualized. No aortic stenosis is present. Pulmonic Valve: The pulmonic valve was normal in structure. Pulmonic valve regurgitation is not visualized. No evidence of pulmonic stenosis. Aorta: The aortic root is normal in size and structure. Venous: The inferior vena cava was not well visualized. IAS/Shunts: The interatrial septum was not well visualized. Additional Comments: Apical hypokinesis with overall mild LV dysfunction.  LEFT VENTRICLE PLAX 2D LVIDd:         5.10 cm   Diastology LVIDs:         3.00 cm   LV e' medial:    9.68 cm/s LV PW:         0.90 cm   LV E/e' medial:  8.3 LV IVS:        0.90 cm   LV e' lateral:   13.30 cm/s LVOT diam:     2.20 cm   LV E/e' lateral: 6.1 LV SV:         84 LV SV Index:   48 LVOT Area:  3.80 cm  RIGHT VENTRICLE RV S prime:     14.10 cm/s TAPSE (M-mode): 2.7 cm LEFT ATRIUM             Index        RIGHT ATRIUM           Index LA diam:        2.60 cm 1.46 cm/m   RA Area:     11.20 cm LA Vol (A2C):   44.3 ml 24.95 ml/m  RA Volume:   21.30 ml  12.00 ml/m LA Vol (A4C):   33.8 ml 19.04 ml/m LA Biplane Vol: 38.5 ml 21.68 ml/m  AORTIC VALVE LVOT Vmax:   105.00 cm/s LVOT Vmean:  67.000 cm/s LVOT VTI:    0.222 m  AORTA Ao Root diam: 3.40 cm Ao Asc diam:  3.40 cm MITRAL VALVE MV Area (PHT): 3.83 cm    SHUNTS MV Decel Time: 198 msec    Systemic VTI:  0.22 m MV E velocity: 80.70 cm/s  Systemic Diam: 2.20 cm MV A velocity: 86.50  cm/s MV E/A ratio:  0.93 Olga Millers MD Electronically signed by Olga Millers MD Signature Date/Time: 01/27/2024/11:58:50 AM    Final    MR ANGIO HEAD WO W CONTRAST Addendum Date: 01/26/2024 ADDENDUM REPORT: 01/26/2024 16:11 ADDENDUM: These results will be called to the ordering clinician or representative by the Radiologist Assistant, and communication documented in the PACS or [employer]. Electronically Signed   By: Sebastian Ache M.D.   On: 01/26/2024 16:11   Result Date: 01/26/2024 CLINICAL DATA:  Headache, neuro deficit. Concern for subarachnoid hemorrhage. Headache and neck pain beginning after an outdoor hunting excursion. CSF analysis showing pleocytosis and elevated glucose and protein. EXAM: MRI HEAD WITHOUT AND WITH CONTRAST MRA HEAD WITHOUT CONTRAST TECHNIQUE: Multiplanar, multi-echo pulse sequences of the brain and surrounding structures were acquired without and with intravenous contrast. Angiographic images of the Circle of Willis were acquired using MRA technique without intravenous contrast. CONTRAST:  7mL GADAVIST GADOBUTROL 1 MMOL/ML IV SOLN COMPARISON:  CTA head and neck 01/25/2024 FINDINGS: MRI HEAD FINDINGS Brain: There is prominent leptomeningeal enhancement diffusely throughout the supratentorial and infratentorial compartments, and there is also mild diffuse pachymeningeal enhancement. Abnormal FLAIR hyperintensity is present within cerebral sulci. There are 2 subcentimeter foci of restricted diffusion within the right frontal white matter at the level of the centrum semiovale. Mild, relatively diffuse cerebral cortical edema is also suspected bilaterally. As noted on CT, the ventricles have a largely slit like appearance. No mass, midline shift, intracranial hemorrhage, or extra-axial fluid collection is identified. There is a partially empty sella. The cerebellar tonsils extend 7 mm below the foramen magnum. Vascular: Diffuse venous engorgement with enlargement of medullary  and cortical veins as well as distension of the dural venous sinuses. Absence of normal flow voids in the sigmoid sinuses likely reflects slow flow rather than thrombosis given normal enhancement. Skull and upper cervical spine: No suspicious marrow lesion. Sinuses/Orbits: Unremarkable orbits. Mild mucosal thickening in the paranasal sinuses. No significant mastoid fluid. Other: None. MRA HEAD FINDINGS Anterior circulation: The internal carotid arteries are widely patent from skull base to carotid termini. ACAs and MCAs are patent without evidence of a proximal branch occlusion or significant proximal stenosis. No aneurysm or AVM is identified. Posterior circulation: The included portions of the intracranial vertebral arteries are patent to the basilar with the left being strongly dominant. Patent PICA and SCA origins are visualized bilaterally. There patent posterior communicating arteries bilaterally.  Both PCAs are patent without evidence of a significant proximal stenosis. No aneurysm or AVM is identified. Anatomic variants: None. IMPRESSION: 1. 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 2. 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 3. 7 mm of cerebellar tonsillar ectopia and diffuse venous engorgement likely related to elevated intracranial pressure. 4. Negative head MRA. Electronically Signed: By: Sebastian Ache M.D. On: 01/26/2024 16:01   MR BRAIN W WO CONTRAST Addendum Date: 01/26/2024 ADDENDUM REPORT: 01/26/2024 16:11 ADDENDUM: These results will be called to the ordering clinician or representative by the Radiologist Assistant, and communication documented in the PACS or [employer]. Electronically Signed   By: Sebastian Ache M.D.   On: 01/26/2024 16:11   Result Date: 01/26/2024 CLINICAL DATA:  Headache, neuro deficit. Concern for subarachnoid hemorrhage.  Headache and neck pain beginning after an outdoor hunting excursion. CSF analysis showing pleocytosis and elevated glucose and protein. EXAM: MRI HEAD WITHOUT AND WITH CONTRAST MRA HEAD WITHOUT CONTRAST TECHNIQUE: Multiplanar, multi-echo pulse sequences of the brain and surrounding structures were acquired without and with intravenous contrast. Angiographic images of the Circle of Willis were acquired using MRA technique without intravenous contrast. CONTRAST:  7mL GADAVIST GADOBUTROL 1 MMOL/ML IV SOLN COMPARISON:  CTA head and neck 01/25/2024 FINDINGS: MRI HEAD FINDINGS Brain: There is prominent leptomeningeal enhancement diffusely throughout the supratentorial and infratentorial compartments, and there is also mild diffuse pachymeningeal enhancement. Abnormal FLAIR hyperintensity is present within cerebral sulci. There are 2 subcentimeter foci of restricted diffusion within the right frontal white matter at the level of the centrum semiovale. Mild, relatively diffuse cerebral cortical edema is also suspected bilaterally. As noted on CT, the ventricles have a largely slit like appearance. No mass, midline shift, intracranial hemorrhage, or extra-axial fluid collection is identified. There is a partially empty sella. The cerebellar tonsils extend 7 mm below the foramen magnum. Vascular: Diffuse venous engorgement with enlargement of medullary and cortical veins as well as distension of the dural venous sinuses. Absence of normal flow voids in the sigmoid sinuses likely reflects slow flow rather than thrombosis given normal enhancement. Skull and upper cervical spine: No suspicious marrow lesion. Sinuses/Orbits: Unremarkable orbits. Mild mucosal thickening in the paranasal sinuses. No significant mastoid fluid. Other: None. MRA HEAD FINDINGS Anterior circulation: The internal carotid arteries are widely patent from skull base to carotid termini. ACAs and MCAs are patent without evidence of a proximal branch occlusion  or significant proximal stenosis. No aneurysm or AVM is identified. Posterior circulation: The included portions of the intracranial vertebral arteries are patent to the basilar with the left being strongly dominant. Patent PICA and SCA origins are visualized bilaterally. There patent posterior communicating arteries bilaterally. Both PCAs are patent without evidence of a significant proximal stenosis. No aneurysm or AVM is identified. Anatomic variants: None. IMPRESSION: 1. 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 2. 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 3. 7 mm of cerebellar tonsillar ectopia and diffuse venous engorgement likely related to elevated intracranial pressure. 4. Negative head MRA. Electronically Signed: By: Sebastian Ache M.D. On: 01/26/2024 16:01        Scheduled Meds:  aspirin EC  81 mg Oral Daily   atorvastatin  80 mg Oral Daily   dexamethasone (DECADRON) injection  10 mg Intravenous Q6H   lidocaine  1 patch Transdermal Q24H   sodium  chloride flush  3 mL Intravenous Q12H   Continuous Infusions:  doxycycline (VIBRAMYCIN) IV 100 mg (01/28/24 1017)          Glade Lloyd, MD [facility] 01/28/2024, 11:23 AM  Outpatient

## 2024-01-28 NOTE — Evaluation (Signed)
 Speech Language Pathology Evaluation Patient Details Name: Samuel Warren MRN: [id number] DOB: Aug 15, 1991 Today's Date: 01/28/2024 Time: 4270-6237 SLP Time Calculation (min) (ACUTE ONLY): 55 min  Problem List:  Patient Active Problem List   Diagnosis Date Noted   Acute encephalopathy 01/25/2024   Headache 01/25/2024   Neck pain 01/25/2024   Leukocytosis 01/25/2024   Past Medical History:  Past Medical History:  Diagnosis Date   Deaf    Past Surgical History: History reviewed. No pertinent surgical history. HPI: Patient is a 33 y.o. male who is deaf, uses ASL (limited amount) and who's family is from [place]. He presented to the hospital on 01/25/24 following an acute onset of a severe headache after hunting with his brother with one episode of emesis. Testing ongoing for suspected viral meningitis. 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 PMH: deafness, chiari malformation.     Assessment / Plan / Recommendation Clinical Impression  SLP utilized ASL interpreter to communicate with patient and Estanislado Spire interpreter to communicate with family. His brother was in the room and he does speak [place]. Brother Programmer, applications) denies patient having a h/o learning disability and he reported that patient was 81 when they moved to [place] and so he had only a little schooling until he aged out. Thoaih also told SLP that all the kids in their family grew up working from a very young age and so overall education level is limited. Patient is married and has a child with his wife, and she also has two children from previous relationship. Patient lives with 8 other family members and works at [employer] along with some of his family members. During evaluation, ASL interpreter let SLP know that patient's sign language vocabulary was limited. SLP found that patient did not seem to have a concept of things such as 'hospital' and was unable to demonstrate understanding of orientation to time  despite today being his birthday. He was able to answer basic questions through a mixture of sign language and gestures. When asked if brother had noticed any changes in how patient is acting, he said, "he's better" than yesterday. Patient will have a lot of support at discharge as per family report. SLP unable to adequately determine if patient has had a significant change in his cognition but suspect he may be near baseline. SLP is recommending skilled services at next venue of care to help determine patient's ability to perform ADL's safely and determine his ability to return to work in the future. SLP to s/o services at this acute level venue of care.    SLP Assessment  SLP Recommendation/Assessment: All further Speech Lanaguage Pathology  needs can be addressed in the next venue of care SLP Visit Diagnosis: Cognitive communication deficit (R41.841)    Recommendations for follow up therapy are one component of a multi-disciplinary discharge planning process, led by the attending physician.  Recommendations may be updated based on patient status, additional functional criteria and insurance authorization.    Follow Up Recommendations  Home health SLP    Assistance Recommended at Discharge  Intermittent Supervision/Assistance  Functional Status Assessment Patient has had a recent decline in their functional status and demonstrates the ability to make significant improvements in function in a reasonable and predictable amount of time.  Frequency and Duration           SLP Evaluation Cognition  Overall Cognitive Status: Difficult to assess Arousal/Alertness: Awake/alert Orientation Level: Oriented to person;Other (comment) (oriented to basic situation, has not concept  of a hospital per brother) Attention: Sustained Sustained Attention: Appears intact       Comprehension  Auditory Comprehension Overall Auditory Comprehension: Appears within functional limits for tasks assessed     Expression Expression Primary Mode of Expression: Verbal Verbal Expression Overall Verbal Expression: Other (comment) (ASL vocabulary limited at baseline)   Oral / Motor  Oral Motor/Sensory Function Overall Oral Motor/Sensory Function: Within functional limits Motor Speech Overall Motor Speech: Appears within functional limits for tasks assessed            Angela Nevin, MA, CCC-SLP Speech Therapy

## 2024-01-28 NOTE — Progress Notes (Signed)
 [facility]  Date of Admission:  01/25/2024      Total days of antibiotics 3   Vanc  Ceftriaxone   Doxy 3/17            ASSESSMENT: Samuel Warren is a 33 y.o. deaf  (Knows limited ASL)  male here for evaluation of severe headache, photosensitivity and neck pain that started driving back from hunting excursion outdoors on Friday 3/14 about 2-3 hours SW in [place].    Headache, Neck Pain -  Meningoencephalitis -  Outdoor Exposure -  LP with 22 WBC (50% N), in setting of >1200 RBC, Protein 91, Glu 80 with negative meningoencephalitis biofire panel - CTA of head did not reveal any cause hemorrhage. No aneurysm noted. 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  Continues to improve. He has good energy today. Still with ongoing head/neck discomfort but he says it is getting better.  -With negative CSF cultures will stop vanc/ceftriaxone.  -Most likely suspect aseptic/viral meningitis  -Continue doxycycline for empiric tick related infections for 7 days through 3/23. -Viral RVP collected and pending - continue droplet until results return.  -Will reach out to neurology to ensure no concerns with stopping steroids.  Transaminitis -  Up again today ALT 203 / AST 75 Tbili normal; Hepatitis panel negative. RPR non reactive.  -stopping ceftriaxone today  -rest per primary team    CVA -  Neurology signed off. Likely this was incidental and outside of current problem. TTE negative. Unless neurology feels strongly for TEE I don't think we need it from ID perspective given no positive blood cultures or csf cultures at this time.  LDL 279, A1C 5.7%   Deaf and ASL Not Primary Language -  For Estanislado Spire onsite/in person [phone] (M-F 8:30a-5p)  Afterhours & weekends ONLY Estanislado Spire Pager # [phone]    Outpatient Care / Transition of Care -  Would be helpful for him to get set up with PCP at discharge to follow up on liver function    ID will sign off - please call back with any questions/concerns or if we can be of further assistance.    PLAN: Continue doxy through 3/23 Stop vanc/ceftriaxone    Principal Problem:   Acute encephalopathy Active Problems:   Headache   Neck pain   Leukocytosis    aspirin EC  81 mg Oral Daily   atorvastatin  80 mg Oral Daily   dexamethasone (DECADRON) injection  10 mg Intravenous Q6H   doxycycline  100 mg Oral BID WC   lidocaine  1 patch Transdermal Q24H   sodium chloride flush  3 mL Intravenous Q12H    SUBJECTIVE: ASL and Estanislado Spire interpretors present with family to help.  Ahan can read / write in English (not complex discussion but simple questions). He actually only communicates with his family in the form of gestures and informal body language.   In discussion with Dairon further, he reports that prior to the onset of his headaches he was suffering from URI with sore throat, runny nose symptoms with intermittent dull headaches at that time. He had no prior hunting / outdoor exposure prior to his trip on Friday March 14th when headache and neck symptoms escalated.   Review of Systems: ROS  No Known Allergies  OBJECTIVE: Vitals:   01/27/24 2327 01/28/24 0249 01/28/24 0752 01/28/24 1216  BP:  118/73 111/78 111/79  Pulse: 64 (!) 55 69 65  Resp: 15 19 18 19   Temp:  98.9 F (37.2 C) 98.3 F (36.8 C) 98.2 F (36.8 C)  TempSrc:  Oral  Oral  SpO2: 95% 93% 92% 95%  Weight:      Height:       Body mass index is 27.32 kg/m.  Physical Exam Vitals and nursing note reviewed.  Constitutional:      Appearance: Normal appearance. He is not ill-appearing.  HENT:     Head: Normocephalic.     Mouth/Throat:     Mouth: Mucous membranes are moist.     Pharynx: Oropharynx is clear.  Eyes:     General: No scleral icterus. Cardiovascular:     Rate and Rhythm: Normal rate and regular rhythm.  Pulmonary:     Effort: Pulmonary effort is normal.  Musculoskeletal:         General: Normal range of motion.     Cervical back: Normal range of motion.  Skin:    Coloration: Skin is not jaundiced or pale.  Neurological:     Mental Status: He is alert and oriented to person, place, and time.     Cranial Nerves: No cranial nerve deficit or facial asymmetry.     Sensory: No sensory deficit.     Motor: No weakness.     Coordination: Coordination normal.     Gait: Gait normal.  Psychiatric:        Mood and Affect: Mood normal.        Judgment: Judgment normal.     Lab Results Lab Results  Component Value Date   WBC 11.7 (H) 01/28/2024   HGB 13.9 01/28/2024   HCT 42.9 01/28/2024   MCV 81.7 01/28/2024   PLT 237 01/28/2024    Lab Results  Component Value Date   CREATININE 1.01 01/28/2024   BUN 23 (H) 01/28/2024   NA 137 01/28/2024   K 3.7 01/28/2024   CL 108 01/28/2024   CO2 22 01/28/2024    Lab Results  Component Value Date   ALT 203 (H) 01/28/2024   AST 75 (H) 01/28/2024   ALKPHOS 49 01/28/2024   BILITOT 1.0 01/28/2024     Microbiology: Recent Results (from the past 240 hours)  Resp panel by RT-PCR (RSV, Flu A&B, Covid) Anterior Nasal Swab     Status: None   Collection Time: 01/25/24 11:53 AM   Specimen: Anterior Nasal Swab  Result Value Ref Range Status   SARS Coronavirus 2 by RT PCR NEGATIVE NEGATIVE Final   Influenza A by PCR NEGATIVE NEGATIVE Final   Influenza B by PCR NEGATIVE NEGATIVE Final    Comment: (NOTE) The Xpert Xpress SARS-CoV-2/FLU/RSV plus assay is intended as an aid in the diagnosis of influenza from Nasopharyngeal swab specimens and should not be used as a sole basis for treatment. Nasal washings and aspirates are unacceptable for Xpert Xpress SARS-CoV-2/FLU/RSV testing.  Fact Sheet for Patients: BloggerCourse.com  Fact Sheet for Healthcare Providers: SeriousBroker.it  This test is not yet approved or cleared by the [place] FDA and has been authorized for  detection and/or diagnosis of SARS-CoV-2 by FDA under an Emergency Use Authorization (EUA). This EUA will remain in effect (meaning this test can be used) for the duration of the COVID-19 declaration under Section 564(b)(1) of the Act, 21 U.S.C. section 360bbb-3(b)(1), unless the authorization is terminated or revoked.     Resp Syncytial Virus by PCR NEGATIVE NEGATIVE Final    Comment: (NOTE) Fact Sheet for Patients: BloggerCourse.com  Fact Sheet  for Healthcare Providers: SeriousBroker.it  This test is not yet approved or cleared by the [place] and has been authorized for detection and/or diagnosis of SARS-CoV-2 by FDA under an Emergency Use Authorization (EUA). This EUA will remain in effect (meaning this test can be used) for the duration of the COVID-19 declaration under Section 564(b)(1) of the Act, 21 U.S.C. section 360bbb-3(b)(1), unless the authorization is terminated or revoked.  Performed at [facility], [address], [place], [place] [address]   CSF culture w Gram Stain     Status: None (Preliminary result)   Collection Time: 01/25/24  2:46 PM   Specimen: CSF; Cerebrospinal Fluid  Result Value Ref Range Status   Specimen Description CSF  Final   Special Requests NONE  Final   Gram Stain   Final    WBC PRESENT,BOTH PMN AND MONONUCLEAR NO ORGANISMS SEEN CYTOSPIN SMEAR    Culture   Final    NO GROWTH 3 DAYS Performed at [facility], [address], [place], [place] [address]    Report Status PENDING  Incomplete  Culture, blood (Routine X 2)     Status: None (Preliminary result)   Collection Time: 01/25/24  2:47 PM   Specimen: BLOOD LEFT WRIST  Result Value Ref Range Status   Specimen Description BLOOD LEFT WRIST  Final   Special Requests   Final    BOTTLES DRAWN AEROBIC AND ANAEROBIC Blood Culture results may not be optimal due to an inadequate volume of blood received in culture bottles    Culture   Final    NO GROWTH 3 DAYS Performed at [facility], [address], [place], [place] [address]    Report Status PENDING  Incomplete  Culture, blood (Routine X 2)     Status: None (Preliminary result)   Collection Time: 01/25/24  2:52 PM   Specimen: BLOOD  Result Value Ref Range Status   Specimen Description BLOOD RIGHT ANTECUBITAL  Final   Special Requests   Final    BOTTLES DRAWN AEROBIC AND ANAEROBIC Blood Culture adequate volume   Culture   Final    NO GROWTH 3 DAYS Performed at [facility], [address] [place], [place] [address]    Report Status PENDING  Incomplete     Rexene Alberts, MSN, NP-C [facility]  [email or web address] Pager: [phone] Office: [phone] [facility] Main Line: [phone] *Secure Chat Communication Welcome

## 2024-01-29 ENCOUNTER — Other Ambulatory Visit (HOSPITAL_COMMUNITY): Payer: Self-pay

## 2024-01-29 DIAGNOSIS — G9340 Encephalopathy, unspecified: Secondary | ICD-10-CM | POA: Diagnosis not present

## 2024-01-29 LAB — CBC
HCT: 44.1 % (ref 39.0–52.0)
Hemoglobin: 14.3 g/dL (ref 13.0–17.0)
MCH: 26.3 pg (ref 26.0–34.0)
MCHC: 32.4 g/dL (ref 30.0–36.0)
MCV: 81.1 fL (ref 80.0–100.0)
Platelets: 250 10*3/uL (ref 150–400)
RBC: 5.44 MIL/uL (ref 4.22–5.81)
RDW: 13.2 % (ref 11.5–15.5)
WBC: 13.4 10*3/uL — ABNORMAL HIGH (ref 4.0–10.5)
nRBC: 0 % (ref 0.0–0.2)

## 2024-01-29 LAB — COMPREHENSIVE METABOLIC PANEL
ALT: 156 U/L — ABNORMAL HIGH (ref 0–44)
AST: 39 U/L (ref 15–41)
Albumin: 3.5 g/dL (ref 3.5–5.0)
Alkaline Phosphatase: 47 U/L (ref 38–126)
Anion gap: 9 (ref 5–15)
BUN: 32 mg/dL — ABNORMAL HIGH (ref 6–20)
CO2: 18 mmol/L — ABNORMAL LOW (ref 22–32)
Calcium: 9 mg/dL (ref 8.9–10.3)
Chloride: 109 mmol/L (ref 98–111)
Creatinine, Ser: 1.09 mg/dL (ref 0.61–1.24)
GFR, Estimated: >60 mL/min (ref >60)
Glucose, Bld: 163 mg/dL — ABNORMAL HIGH (ref 70–99)
Potassium: 4 mmol/L (ref 3.5–5.1)
Sodium: 136 mmol/L (ref 135–145)
Total Bilirubin: 0.8 mg/dL (ref 0.0–1.2)
Total Protein: 7 g/dL (ref 6.5–8.1)

## 2024-01-29 LAB — CSF CULTURE W GRAM STAIN

## 2024-01-29 LAB — HEPATITIS PANEL, ACUTE
HCV Ab: NONREACTIVE
Hep A IgM: NONREACTIVE
Hep B C IgM: NONREACTIVE
Hepatitis B Surface Ag: NONREACTIVE

## 2024-01-29 LAB — EHRLICHIA ANTIBODY PANEL
E chaffeensis (HGE) Ab, IgG: NEGATIVE
E chaffeensis (HGE) Ab, IgM: NEGATIVE
E. Chaffeensis (HME) IgM Titer: NEGATIVE
E.Chaffeensis (HME) IgG: NEGATIVE

## 2024-01-29 LAB — MAGNESIUM: Magnesium: 2.2 mg/dL (ref 1.7–2.4)

## 2024-01-29 MED ORDER — ONDANSETRON HCL 4 MG PO TABS
4.0000 mg | ORAL_TABLET | Freq: Four times a day (QID) | ORAL | 0 refills | Status: DC | PRN
  Filled 2024-01-29: qty 20, 5d supply, fill #0

## 2024-01-29 MED ORDER — ASPIRIN 81 MG PO TBEC
81.0000 mg | DELAYED_RELEASE_TABLET | Freq: Every day | ORAL | 0 refills | Status: DC
  Filled 2024-01-29: qty 30, 30d supply, fill #0

## 2024-01-29 MED ORDER — DOXYCYCLINE HYCLATE 100 MG PO TABS
100.0000 mg | ORAL_TABLET | Freq: Two times a day (BID) | ORAL | 0 refills | Status: DC
  Filled 2024-01-29: qty 14, 7d supply, fill #0

## 2024-01-29 NOTE — Progress Notes (Signed)
 Mobility Specialist Progress Note:   01/29/24 1051  Mobility  Activity Ambulated with assistance in hallway;Ambulated with assistance in room  Level of Assistance Contact guard assist, steadying assist  Assistive Device Front wheel walker  Distance Ambulated (ft) 240 ft  Activity Response Tolerated well  Mobility Referral Yes  Mobility visit 1 Mobility  Mobility Specialist Start Time (ACUTE ONLY) 1041  Mobility Specialist Stop Time (ACUTE ONLY) 1051  Mobility Specialist Time Calculation (min) (ACUTE ONLY) 10 min   Pt received in bed, brother at bedside. Agreeable to mobility session. Tolerated well, asx throughout. Returned pt to room, lying comfortably in bed with all needs met.   Pre Mobility: HR 42 bpm During Mobility: HR 65-75 bpm   Feliciana Rossetti Mobility Specialist Please contact via Special educational needs teacher or  Rehab office at [phone]

## 2024-01-29 NOTE — TOC Transition Note (Signed)
 Transition of Care (TOC) - Discharge Note Donn Pierini RN, BSN Transitions of Care Unit 4E- RN Case Manager See Treatment Team for direct phone #   Patient Details  Name: Samuel Warren MRN: [id number] Date of Birth: 1991-10-30  [facility] CM/SW Contact:  Darrold Span, RN Phone Number: 01/29/2024, 11:53 AM   Clinical Narrative:    Pt stable for transition home today, referral received for PCP needs- follow up has been scheduled for April 22.   Order also placed for HHSLP- however no other HH needs- pt will need to do outpt SLP follow up as [facility] agencies will not follow just for SLP needs. - MD gave verbal for outpt SLP referral to neuro rehab.   Referral made via epic to [facility] outpt neuro rehab for SLP follow up- ref #- [id number]  Family to transport home   Final next level of care: OP Rehab Barriers to Discharge: No Barriers Identified   Patient Goals and CMS Choice Patient states their goals for this hospitalization and ongoing recovery are:: return home w/ family   Choice offered to / list presented to : NA      Discharge Placement               Home        Discharge Plan and Services Additional resources added to the After Visit Summary for     Discharge Planning Services: CM Consult, Follow-up appt scheduled            DME Arranged: N/A DME Agency: NA       HH Arranged: NA HH Agency: NA        Social Drivers of Health (SDOH) Interventions SDOH Screenings   Food Insecurity: No Food Insecurity (01/25/2024)  Housing: Low Risk  (01/25/2024)  Transportation Needs: No Transportation Needs (01/25/2024)  Utilities: Not At Risk (01/25/2024)  Tobacco Use: Low Risk  (01/25/2024)     Readmission Risk Interventions    01/29/2024   11:53 AM  Readmission Risk Prevention Plan  Post Dischage Appt Complete  Medication Screening Complete  Transportation Screening Complete

## 2024-01-29 NOTE — Progress Notes (Signed)
 Waiting for patient's sister in law to come pick up patient and to receive discharge instructions.

## 2024-01-29 NOTE — Discharge Summary (Signed)
 Physician Discharge Summary  Samuel Warren PIR:[id number] DOB: February 27, 1991 DOA: 01/25/2024  PCP: Pcp, No  Admit date: 01/25/2024 Discharge date: 01/29/2024  Admitted From: Home Disposition: Home  Recommendations for Outpatient Follow-up:  Follow up with PCP in 1 week with repeat CBC/BMP Outpatient follow-up with neurology and ID Follow up in ED if symptoms worsen or new appear   Home Health: Home health SLP Equipment/Devices: None  Discharge Condition: Stable CODE STATUS: Full Diet recommendation: Regular Brief/Interim Summary: 33 y.o. male with medical history significant of deafness presented with severe headache and neck pain after returning from a hunting trip.  On presentation, Samuel Warren underwent CT angiogram of the head and neck followed by lumbar puncture.  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  Neurology and ID were consulted.  Neurology recommended outpatient follow-up with neurology.  Samuel Warren was initially on broad-spectrum antibiotics but ID has narrowed it to doxycycline alone and recommended total of 10-day course of therapy.  Headache is improved and Samuel Warren is ambulating well with no focal neurologic deficit.  He feels okay to go home today.  Discharge Samuel Warren home today on oral doxycycline.  Outpatient follow-up with PCP/neurology/ID.  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 ID has narrowed it to doxycycline alone and recommended total of 10-day course of therapy. MRI as above.  Underwent LP as well: Cultures negative so far.  Headache improving. -Samuel Warren is ambulating well with no focal neurologic deficit.  He feels okay to go home today.  Discharge  Samuel Warren home today on oral doxycycline for 7 more days.  Outpatient follow-up with PCP/neurology/ID. -IV Decadron has been discontinued as well.  Incidental finding of acute infarcts in the right frontal white matter -Likely due to infectious vasculitis versus small vessel disease in the setting of CNS infection.  CTA head and neck showed no LVO -Echo showed EF of 45 to 50% -LDL 279.  Samuel Warren was been started on high intensity statin by neurology: Statin discontinued on 01/28/2024 because of rising LFTs after neurology said that once LFTs improve, statin rechallenge can be done as an outpatient. -A1c 5.7. -Continue aspirin 81 mg daily.  Outpatient follow-up with neurology. -Tolerating diet.  No PT/OT follow-up needed as per PT and OT   Deafness -Chronic   Leukocytosis -Mild   Acute metabolic acidosis -Resolved   Elevated LFTs -Possibly viral.  LFTs improving.  Statin plan as above.  Check hepatitis panel in AM.  Outpatient follow-up of LFTs.  Hepatitis panel negative.  Discharge Instructions   Allergies as of 01/29/2024   No Known Allergies      Medication List     TAKE these medications    acetaminophen 500 MG tablet Commonly known as: TYLENOL Take 1,000 mg by mouth every 8 (eight) hours as needed for headache.   aspirin EC 81 MG tablet Take 1 tablet (81 mg total) by mouth daily. Swallow whole.   doxycycline 100 MG tablet Commonly known as: VIBRA-TABS Take 1 tablet (100 mg total) by mouth 2 (two) times daily with a meal for 7 days.   ondansetron 4 MG tablet Commonly known as: ZOFRAN Take 1 tablet (4 mg total) by mouth every 6 (six) hours as needed for nausea.        Follow-up  Information     [facility]. Schedule an appointment as soon as possible for a visit in 1 month(s).   Specialty: Neurology Contact information: [address] [place] [address] [phone]        Shirline Frees, NP Follow  up on 03/02/2024.   Specialty: Family Medicine Why: Appointment is at 3:00pm Contact information: [address] [place] [address] [phone]                No Known Allergies  Consultations: ID/neurology   Procedures/Studies: ECHOCARDIOGRAM COMPLETE Result Date: 01/27/2024    ECHOCARDIOGRAM REPORT   Samuel Warren Name:   [facility] Date of Exam: 01/27/2024 Medical Rec #:  [id number]    Height:       64.0 in Accession #:    [id number]   Weight:       159.2 lb Date of Birth:  08/08/1991    BSA:          1.775 m Samuel Warren Age:    32 years     BP:           94/74 mmHg Samuel Warren Gender: M            HR:           71 bpm. Exam Location:  Inpatient Procedure: 2D Echo, Cardiac Doppler and Color Doppler (Both Spectral and Color            Flow Doppler were utilized during procedure). Indications:    Stroke I63.9  History:        Samuel Warren has no prior history of Echocardiogram examinations.  Sonographer:    Harriette Bouillon RDCS Referring Phys: [id number] ASHISH ARORA IMPRESSIONS  1. Apical hypokinesis with overall mild LV dysfunction.  2. Left ventricular ejection fraction, by estimation, is 45 to 50%. The left ventricle has mildly decreased function. The left ventricle demonstrates regional wall motion abnormalities (see scoring diagram/findings for description). Left ventricular diastolic parameters were normal.  3. Right ventricular systolic function is normal. The right ventricular size is normal.  4. The mitral valve is normal in structure. Trivial mitral valve regurgitation. No evidence of mitral stenosis.  5. The aortic valve is tricuspid. Aortic valve regurgitation is not visualized. No aortic stenosis is present. Comparison(s): No prior Echocardiogram. FINDINGS  Left Ventricle: Left ventricular ejection fraction, by estimation, is 45 to 50%. The left ventricle has mildly decreased function. The left ventricle demonstrates regional wall motion abnormalities. The left ventricular internal cavity  size was normal in size. There is no left ventricular hypertrophy. Left ventricular diastolic parameters were normal. Right Ventricle: The right ventricular size is normal. Right ventricular systolic function is normal. Left Atrium: Left atrial size was normal in size. Right Atrium: Right atrial size was normal in size. Pericardium: There is no evidence of pericardial effusion. Mitral Valve: The mitral valve is normal in structure. Trivial mitral valve regurgitation. No evidence of mitral valve stenosis. Tricuspid Valve: The tricuspid valve is normal in structure. Tricuspid valve regurgitation is trivial. No evidence of tricuspid stenosis. Aortic Valve: The aortic valve is tricuspid. Aortic valve regurgitation is not visualized. No aortic stenosis is present. Pulmonic Valve: The pulmonic valve was normal in structure. Pulmonic valve regurgitation is not visualized. No evidence of pulmonic stenosis. Aorta: The aortic root is normal in size and structure. Venous: The inferior vena cava was not well visualized. IAS/Shunts: The interatrial septum was not well visualized. Additional Comments: Apical hypokinesis with overall mild LV dysfunction.  LEFT VENTRICLE PLAX 2D LVIDd:         5.10 cm   Diastology LVIDs:         3.00 cm   LV e' medial:    9.68 cm/s LV PW:         0.90 cm   LV E/e' medial:  8.3 LV IVS:        0.90 cm   LV e' lateral:   13.30 cm/s LVOT diam:     2.20 cm   LV E/e' lateral: 6.1 LV SV:         84 LV SV Index:   48 LVOT Area:     3.80 cm  RIGHT VENTRICLE RV S prime:     14.10 cm/s TAPSE (M-mode): 2.7 cm LEFT ATRIUM             Index        RIGHT ATRIUM           Index LA diam:        2.60 cm 1.46 cm/m   RA Area:     11.20 cm LA Vol (A2C):   44.3 ml 24.95 ml/m  RA Volume:   21.30 ml  12.00 ml/m LA Vol (A4C):   33.8 ml 19.04 ml/m LA Biplane Vol: 38.5 ml 21.68 ml/m  AORTIC VALVE LVOT Vmax:   105.00 cm/s LVOT Vmean:  67.000 cm/s LVOT VTI:    0.222 m  AORTA Ao Root diam: 3.40 cm Ao Asc diam:  3.40 cm  MITRAL VALVE MV Area (PHT): 3.83 cm    SHUNTS MV Decel Time: 198 msec    Systemic VTI:  0.22 m MV E velocity: 80.70 cm/s  Systemic Diam: 2.20 cm MV A velocity: 86.50 cm/s MV E/A ratio:  0.93 Olga Millers MD Electronically signed by Olga Millers MD Signature Date/Time: 01/27/2024/11:58:50 AM    Final    MR ANGIO HEAD WO W CONTRAST Addendum Date: 01/26/2024 ADDENDUM REPORT: 01/26/2024 16:11 ADDENDUM: These results will be called to the ordering clinician or representative by the Radiologist Assistant, and communication documented in the PACS or [employer]. Electronically Signed   By: Sebastian Ache M.D.   On: 01/26/2024 16:11   Result Date: 01/26/2024 CLINICAL DATA:  Headache, neuro deficit. Concern for subarachnoid hemorrhage. Headache and neck pain beginning after an outdoor hunting excursion. CSF analysis showing pleocytosis and elevated glucose and protein. EXAM: MRI HEAD WITHOUT AND WITH CONTRAST MRA HEAD WITHOUT CONTRAST TECHNIQUE: Multiplanar, multi-echo pulse sequences of the brain and surrounding structures were acquired without and with intravenous contrast. Angiographic images of the Circle of Willis were acquired using MRA technique without intravenous contrast. CONTRAST:  7mL GADAVIST GADOBUTROL 1 MMOL/ML IV SOLN COMPARISON:  CTA head and neck 01/25/2024 FINDINGS: MRI HEAD FINDINGS Brain: There is prominent leptomeningeal enhancement diffusely throughout the supratentorial and infratentorial compartments, and there is also mild diffuse pachymeningeal enhancement. Abnormal FLAIR hyperintensity is present within cerebral sulci. There are 2 subcentimeter foci of restricted diffusion within the right frontal white matter at the level of the centrum semiovale. Mild, relatively diffuse cerebral cortical edema is also suspected bilaterally. As noted on CT, the ventricles have a largely slit like appearance. No mass, midline shift, intracranial hemorrhage, or extra-axial fluid collection is  identified. There is a partially empty sella. The cerebellar tonsils extend 7 mm below the foramen magnum. Vascular: Diffuse venous engorgement with enlargement of medullary and cortical veins as well as distension of the dural venous sinuses. Absence of normal flow  voids in the sigmoid sinuses likely reflects slow flow rather than thrombosis given normal enhancement. Skull and upper cervical spine: No suspicious marrow lesion. Sinuses/Orbits: Unremarkable orbits. Mild mucosal thickening in the paranasal sinuses. No significant mastoid fluid. Other: None. MRA HEAD FINDINGS Anterior circulation: The internal carotid arteries are widely patent from skull base to carotid termini. ACAs and MCAs are patent without evidence of a proximal branch occlusion or significant proximal stenosis. No aneurysm or AVM is identified. Posterior circulation: The included portions of the intracranial vertebral arteries are patent to the basilar with the left being strongly dominant. Patent PICA and SCA origins are visualized bilaterally. There patent posterior communicating arteries bilaterally. Both PCAs are patent without evidence of a significant proximal stenosis. No aneurysm or AVM is identified. Anatomic variants: None. IMPRESSION: 1. 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 2. 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 3. 7 mm of cerebellar tonsillar ectopia and diffuse venous engorgement likely related to elevated intracranial pressure. 4. Negative head MRA. Electronically Signed: By: Sebastian Ache M.D. On: 01/26/2024 16:01   MR BRAIN W WO CONTRAST Addendum Date: 01/26/2024 ADDENDUM REPORT: 01/26/2024 16:11 ADDENDUM: These results will be called to the ordering clinician or representative by the Radiologist Assistant, and communication documented in the PACS or [employer].  Electronically Signed   By: Sebastian Ache M.D.   On: 01/26/2024 16:11   Result Date: 01/26/2024 CLINICAL DATA:  Headache, neuro deficit. Concern for subarachnoid hemorrhage. Headache and neck pain beginning after an outdoor hunting excursion. CSF analysis showing pleocytosis and elevated glucose and protein. EXAM: MRI HEAD WITHOUT AND WITH CONTRAST MRA HEAD WITHOUT CONTRAST TECHNIQUE: Multiplanar, multi-echo pulse sequences of the brain and surrounding structures were acquired without and with intravenous contrast. Angiographic images of the Circle of Willis were acquired using MRA technique without intravenous contrast. CONTRAST:  7mL GADAVIST GADOBUTROL 1 MMOL/ML IV SOLN COMPARISON:  CTA head and neck 01/25/2024 FINDINGS: MRI HEAD FINDINGS Brain: There is prominent leptomeningeal enhancement diffusely throughout the supratentorial and infratentorial compartments, and there is also mild diffuse pachymeningeal enhancement. Abnormal FLAIR hyperintensity is present within cerebral sulci. There are 2 subcentimeter foci of restricted diffusion within the right frontal white matter at the level of the centrum semiovale. Mild, relatively diffuse cerebral cortical edema is also suspected bilaterally. As noted on CT, the ventricles have a largely slit like appearance. No mass, midline shift, intracranial hemorrhage, or extra-axial fluid collection is identified. There is a partially empty sella. The cerebellar tonsils extend 7 mm below the foramen magnum. Vascular: Diffuse venous engorgement with enlargement of medullary and cortical veins as well as distension of the dural venous sinuses. Absence of normal flow voids in the sigmoid sinuses likely reflects slow flow rather than thrombosis given normal enhancement. Skull and upper cervical spine: No suspicious marrow lesion. Sinuses/Orbits: Unremarkable orbits. Mild mucosal thickening in the paranasal sinuses. No significant mastoid fluid. Other: None. MRA HEAD FINDINGS  Anterior circulation: The internal carotid arteries are widely patent from skull base to carotid termini. ACAs and MCAs are patent without evidence of a proximal branch occlusion or significant proximal stenosis. No aneurysm or AVM is identified. Posterior circulation: The included portions of the intracranial vertebral arteries are patent to the basilar with the left being strongly dominant. Patent PICA and SCA origins are visualized bilaterally. There patent posterior communicating arteries bilaterally. Both PCAs are patent without evidence of a significant proximal stenosis. No  aneurysm or AVM is identified. Anatomic variants: None. IMPRESSION: 1. 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 2. 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 3. 7 mm of cerebellar tonsillar ectopia and diffuse venous engorgement likely related to elevated intracranial pressure. 4. Negative head MRA. Electronically Signed: By: Sebastian Ache M.D. On: 01/26/2024 16:01   CT ANGIO HEAD NECK W WO CM Result Date: 01/25/2024 CLINICAL DATA:  Neuro deficit, acute, stroke suspected EXAM: CT ANGIOGRAPHY HEAD AND NECK WITH AND WITHOUT CONTRAST TECHNIQUE: Multidetector CT imaging of the head and neck was performed using the standard protocol during bolus administration of intravenous contrast. Multiplanar CT image reconstructions and MIPs were obtained to evaluate the vascular anatomy. Carotid stenosis measurements (when applicable) are obtained utilizing NASCET criteria, using the distal internal carotid diameter as the denominator. RADIATION DOSE REDUCTION: This exam was performed according to the departmental dose-optimization program which includes automated exposure control, adjustment of the mA and/or kV according to Samuel Warren size and/or use of iterative reconstruction technique. CONTRAST:  75mL OMNIPAQUE  IOHEXOL 350 MG/ML SOLN COMPARISON:  None Available. FINDINGS: CT HEAD FINDINGS Brain: No focal brain insult is seen. There are no visible subarachnoid spaces other than slit like ventricles, raising the possibility of generalized brain swelling or subacute subarachnoid hemorrhage. Vascular: No abnormal vascular finding. Skull: Negative Sinuses/Orbits: Clear/normal Other: None Review of the MIP images confirms the above findings CTA NECK FINDINGS Aortic arch: Normal Right carotid system: Common carotid artery widely patent to the bifurcation. Normal bifurcation. Normal cervical ICA. Left carotid system: Left carotid system similarly normal. Vertebral arteries: Both vertebral artery origins are widely patent. The left is dominant. Both vessels are normal through the cervical region to the foramen magnum. Skeleton: Negative Other neck: No significant finding. Upper chest: Lung apices are clear. Review of the MIP images confirms the above findings CTA HEAD FINDINGS Anterior circulation: Both internal carotid arteries are patent through the skull base and siphon regions. Intracranial detail is limited by motion and artifact from upper extremities. No evidence of vascular occlusion. No aneurysm is identified, but detail is limited. Posterior circulation: Both vertebral arteries are patent through the foramen magnum. Right may terminate in PICA. Left vertebral artery supplies basilar artery. Basilar detail is limited by motion. Flow is present in both posterior cerebral arteries. Venous sinuses: Patent and normal. Anatomic variants: None significant. Review of the MIP images confirms the above findings IMPRESSION: 1. No focal brain insult is seen. There are no visible subarachnoid spaces other than slit like ventricles, raising the possibility of generalized brain swelling or subacute subarachnoid hemorrhage. 2. No evidence of arterial stenosis or occlusion in the neck. 3. Intracranial detail is limited by motion and  artifact from upper extremities. No evidence of vascular occlusion. No aneurysm is identified, but detail is limited. Electronically Signed   By: Paulina Fusi M.D.   On: 01/25/2024 14:07      Subjective: Samuel Warren seen and examined at bedside.  Family member present at bedside.  No worsening headache, vomiting, fever reported.  Discharge Exam: Vitals:   01/28/24 2257 01/29/24 0321  BP: 107/71 117/72  Pulse: 60 (!) 49  Resp: 20 18  Temp: (!) 97.4 F (36.3 C) 97.9 F (36.6 C)  SpO2: 95% 93%    General: Pt is alert, awake, not in acute distress.  On room air. Cardiovascular: rate controlled, S1/S2 + Respiratory: bilateral decreased breath sounds at bases Abdominal: Soft, NT,  ND, bowel sounds + Extremities: no edema, no cyanosis    The results of significant diagnostics from this hospitalization (including imaging, microbiology, ancillary and laboratory) are listed below for reference.     Microbiology: Recent Results (from the past 240 hours)  Resp panel by RT-PCR (RSV, Flu A&B, Covid) Anterior Nasal Swab     Status: None   Collection Time: 01/25/24 11:53 AM   Specimen: Anterior Nasal Swab  Result Value Ref Range Status   SARS Coronavirus 2 by RT PCR NEGATIVE NEGATIVE Final   Influenza A by PCR NEGATIVE NEGATIVE Final   Influenza B by PCR NEGATIVE NEGATIVE Final    Comment: (NOTE) The Xpert Xpress SARS-CoV-2/FLU/RSV plus assay is intended as an aid in the diagnosis of influenza from Nasopharyngeal swab specimens and should not be used as a sole basis for treatment. Nasal washings and aspirates are unacceptable for Xpert Xpress SARS-CoV-2/FLU/RSV testing.  Fact Sheet for Patients: BloggerCourse.com  Fact Sheet for Healthcare Providers: SeriousBroker.it  This test is not yet approved or cleared by the [place] FDA and has been authorized for detection and/or diagnosis of SARS-CoV-2 by FDA under an Emergency Use  Authorization (EUA). This EUA will remain in effect (meaning this test can be used) for the duration of the COVID-19 declaration under Section 564(b)(1) of the Act, 21 U.S.C. section 360bbb-3(b)(1), unless the authorization is terminated or revoked.     Resp Syncytial Virus by PCR NEGATIVE NEGATIVE Final    Comment: (NOTE) Fact Sheet for Patients: BloggerCourse.com  Fact Sheet for Healthcare Providers: SeriousBroker.it  This test is not yet approved or cleared by the [place] FDA and has been authorized for detection and/or diagnosis of SARS-CoV-2 by FDA under an Emergency Use Authorization (EUA). This EUA will remain in effect (meaning this test can be used) for the duration of the COVID-19 declaration under Section 564(b)(1) of the Act, 21 U.S.C. section 360bbb-3(b)(1), unless the authorization is terminated or revoked.  Performed at [facility], [address], [place], [place] [address]   CSF culture w Gram Stain     Status: None (Preliminary result)   Collection Time: 01/25/24  2:46 PM   Specimen: CSF; Cerebrospinal Fluid  Result Value Ref Range Status   Specimen Description CSF  Final   Special Requests NONE  Final   Gram Stain   Final    WBC PRESENT,BOTH PMN AND MONONUCLEAR NO ORGANISMS SEEN CYTOSPIN SMEAR    Culture   Final    NO GROWTH 3 DAYS Performed at [facility], [address] [place], [place] [address]    Report Status PENDING  Incomplete  Culture, blood (Routine X 2)     Status: None (Preliminary result)   Collection Time: 01/25/24  2:47 PM   Specimen: BLOOD LEFT WRIST  Result Value Ref Range Status   Specimen Description BLOOD LEFT WRIST  Final   Special Requests   Final    BOTTLES DRAWN AEROBIC AND ANAEROBIC Blood Culture results may not be optimal due to an inadequate volume of blood received in culture bottles   Culture   Final    NO GROWTH 4 DAYS Performed at [facility], [address], [place], [place] [address]    Report Status PENDING  Incomplete  Culture, blood (Routine X 2)     Status: None (Preliminary result)   Collection Time: 01/25/24  2:52 PM   Specimen: BLOOD  Result Value Ref Range Status   Specimen Description  BLOOD RIGHT ANTECUBITAL  Final   Special Requests   Final    BOTTLES DRAWN AEROBIC AND ANAEROBIC Blood Culture adequate volume   Culture   Final    NO GROWTH 4 DAYS Performed at [facility], [address] [place], [place] [address]    Report Status PENDING  Incomplete  Respiratory (~20 pathogens) panel by PCR     Status: Abnormal   Collection Time: 01/28/24  6:00 AM   Specimen: Nasopharyngeal Swab; Respiratory  Result Value Ref Range Status   Adenovirus NOT DETECTED NOT DETECTED Final   Coronavirus 229E DETECTED (A) NOT DETECTED Final    Comment: (NOTE) The Coronavirus on the Respiratory Panel, DOES NOT test for the novel  Coronavirus (2019 nCoV)    Coronavirus HKU1 NOT DETECTED NOT DETECTED Final   Coronavirus NL63 NOT DETECTED NOT DETECTED Final   Coronavirus OC43 NOT DETECTED NOT DETECTED Final   Metapneumovirus NOT DETECTED NOT DETECTED Final   Rhinovirus / Enterovirus NOT DETECTED NOT DETECTED Final   Influenza A NOT DETECTED NOT DETECTED Final   Influenza B NOT DETECTED NOT DETECTED Final   Parainfluenza Virus 1 NOT DETECTED NOT DETECTED Final   Parainfluenza Virus 2 NOT DETECTED NOT DETECTED Final   Parainfluenza Virus 3 NOT DETECTED NOT DETECTED Final   Parainfluenza Virus 4 NOT DETECTED NOT DETECTED Final   Respiratory Syncytial Virus NOT DETECTED NOT DETECTED Final   Bordetella pertussis NOT DETECTED NOT DETECTED Final   Bordetella Parapertussis NOT DETECTED NOT DETECTED Final   Chlamydophila pneumoniae NOT DETECTED NOT DETECTED Final   Mycoplasma pneumoniae NOT DETECTED NOT DETECTED Final    Comment: Performed at [facility], [address], [place], [place] [address]     Labs: BNP  (last 3 results) No results for input(s): "BNP" in the last 8760 hours. Basic Metabolic Panel: Recent Labs  Lab 01/25/24 1218 01/26/24 0349 01/28/24 0310 01/29/24 0405  NA 137 137 137 136  K 3.8 4.0 3.7 4.0  CL 101 107 108 109  CO2 21* 18* 22 18*  GLUCOSE 148* 141* 165* 163*  BUN 18 10 23* 32*  CREATININE 0.99 1.05 1.01 1.09  CALCIUM 10.1 9.4 9.2 9.0  MG  --   --   --  2.2   Liver Function Tests: Recent Labs  Lab 01/26/24 0349 01/28/24 0310 01/29/24 0405  AST 51* 75* 39  ALT 120* 203* 156*  ALKPHOS 51 49 47  BILITOT 0.9 1.0 0.8  PROT 7.8 7.2 7.0  ALBUMIN 3.8 3.5 3.5   No results for input(s): "LIPASE", "AMYLASE" in the last 168 hours. No results for input(s): "AMMONIA" in the last 168 hours. CBC: Recent Labs  Lab 01/25/24 1218 01/26/24 0349 01/28/24 0310 01/29/24 0405  WBC 11.8* 6.8 11.7* 13.4*  NEUTROABS 9.8*  --   --   --   HGB 14.7 14.0 13.9 14.3  HCT 46.6 43.6 42.9 44.1  MCV 84.1 82.9 81.7 81.1  PLT 194 197 237 250   Cardiac Enzymes: No results for input(s): "CKTOTAL", "CKMB", "CKMBINDEX", "TROPONINI" in the last 168 hours. BNP: Invalid input(s): "POCBNP" CBG: Recent Labs  Lab 01/26/24 1524  GLUCAP 117*   D-Dimer No results for input(s): "DDIMER" in the last 72 hours. Hgb A1c Recent Labs    01/26/24 2047  HGBA1C 5.7*   Lipid Profile Recent Labs    01/27/24 0422  CHOL 346*  HDL 47  LDLCALC 279*  TRIG 99  CHOLHDL 7.4   Thyroid function  studies No results for input(s): "TSH", "T4TOTAL", "T3FREE", "THYROIDAB" in the last 72 hours.  Invalid input(s): "FREET3" Anemia work up No results for input(s): "VITAMINB12", "FOLATE", "FERRITIN", "TIBC", "IRON", "RETICCTPCT" in the last 72 hours. Urinalysis No results found for: "COLORURINE", "APPEARANCEUR", "LABSPEC", "PHURINE", "GLUCOSEU", "HGBUR", "BILIRUBINUR", "KETONESUR", "PROTEINUR", "UROBILINOGEN", "NITRITE", "LEUKOCYTESUR" Sepsis Labs Recent Labs  Lab 01/25/24 1218 01/26/24 0349  01/28/24 0310 01/29/24 0405  WBC 11.8* 6.8 11.7* 13.4*   Microbiology Recent Results (from the past 240 hours)  Resp panel by RT-PCR (RSV, Flu A&B, Covid) Anterior Nasal Swab     Status: None   Collection Time: 01/25/24 11:53 AM   Specimen: Anterior Nasal Swab  Result Value Ref Range Status   SARS Coronavirus 2 by RT PCR NEGATIVE NEGATIVE Final   Influenza A by PCR NEGATIVE NEGATIVE Final   Influenza B by PCR NEGATIVE NEGATIVE Final    Comment: (NOTE) The Xpert Xpress SARS-CoV-2/FLU/RSV plus assay is intended as an aid in the diagnosis of influenza from Nasopharyngeal swab specimens and should not be used as a sole basis for treatment. Nasal washings and aspirates are unacceptable for Xpert Xpress SARS-CoV-2/FLU/RSV testing.  Fact Sheet for Patients: BloggerCourse.com  Fact Sheet for Healthcare Providers: SeriousBroker.it  This test is not yet approved or cleared by the [place] FDA and has been authorized for detection and/or diagnosis of SARS-CoV-2 by FDA under an Emergency Use Authorization (EUA). This EUA will remain in effect (meaning this test can be used) for the duration of the COVID-19 declaration under Section 564(b)(1) of the Act, 21 U.S.C. section 360bbb-3(b)(1), unless the authorization is terminated or revoked.     Resp Syncytial Virus by PCR NEGATIVE NEGATIVE Final    Comment: (NOTE) Fact Sheet for Patients: BloggerCourse.com  Fact Sheet for Healthcare Providers: SeriousBroker.it  This test is not yet approved or cleared by the [place] FDA and has been authorized for detection and/or diagnosis of SARS-CoV-2 by FDA under an Emergency Use Authorization (EUA). This EUA will remain in effect (meaning this test can be used) for the duration of the COVID-19 declaration under Section 564(b)(1) of the Act, 21 U.S.C. section 360bbb-3(b)(1), unless the  authorization is terminated or revoked.  Performed at [facility], [address] [place], [place] [address]   CSF culture w Gram Stain     Status: None (Preliminary result)   Collection Time: 01/25/24  2:46 PM   Specimen: CSF; Cerebrospinal Fluid  Result Value Ref Range Status   Specimen Description CSF  Final   Special Requests NONE  Final   Gram Stain   Final    WBC PRESENT,BOTH PMN AND MONONUCLEAR NO ORGANISMS SEEN CYTOSPIN SMEAR    Culture   Final    NO GROWTH 3 DAYS Performed at [facility], [address], [place], [place] [address]    Report Status PENDING  Incomplete  Culture, blood (Routine X 2)     Status: None (Preliminary result)   Collection Time: 01/25/24  2:47 PM   Specimen: BLOOD LEFT WRIST  Result Value Ref Range Status   Specimen Description BLOOD LEFT WRIST  Final   Special Requests   Final    BOTTLES DRAWN AEROBIC AND ANAEROBIC Blood Culture results may not be optimal due to an inadequate volume of blood received in culture bottles   Culture   Final    NO GROWTH 4 DAYS Performed at [facility], [address] [place], [place] [address]    Report  Status PENDING  Incomplete  Culture, blood (Routine X 2)     Status: None (Preliminary result)   Collection Time: 01/25/24  2:52 PM   Specimen: BLOOD  Result Value Ref Range Status   Specimen Description BLOOD RIGHT ANTECUBITAL  Final   Special Requests   Final    BOTTLES DRAWN AEROBIC AND ANAEROBIC Blood Culture adequate volume   Culture   Final    NO GROWTH 4 DAYS Performed at [facility], [address] [place], [place] [address]    Report Status PENDING  Incomplete  Respiratory (~20 pathogens) panel by PCR     Status: Abnormal   Collection Time: 01/28/24  6:00 AM   Specimen: Nasopharyngeal Swab; Respiratory  Result Value Ref Range Status   Adenovirus NOT DETECTED NOT DETECTED Final   Coronavirus 229E DETECTED (A) NOT DETECTED Final    Comment: (NOTE) The Coronavirus  on the Respiratory Panel, DOES NOT test for the novel  Coronavirus (2019 nCoV)    Coronavirus HKU1 NOT DETECTED NOT DETECTED Final   Coronavirus NL63 NOT DETECTED NOT DETECTED Final   Coronavirus OC43 NOT DETECTED NOT DETECTED Final   Metapneumovirus NOT DETECTED NOT DETECTED Final   Rhinovirus / Enterovirus NOT DETECTED NOT DETECTED Final   Influenza A NOT DETECTED NOT DETECTED Final   Influenza B NOT DETECTED NOT DETECTED Final   Parainfluenza Virus 1 NOT DETECTED NOT DETECTED Final   Parainfluenza Virus 2 NOT DETECTED NOT DETECTED Final   Parainfluenza Virus 3 NOT DETECTED NOT DETECTED Final   Parainfluenza Virus 4 NOT DETECTED NOT DETECTED Final   Respiratory Syncytial Virus NOT DETECTED NOT DETECTED Final   Bordetella pertussis NOT DETECTED NOT DETECTED Final   Bordetella Parapertussis NOT DETECTED NOT DETECTED Final   Chlamydophila pneumoniae NOT DETECTED NOT DETECTED Final   Mycoplasma pneumoniae NOT DETECTED NOT DETECTED Final    Comment: Performed at [facility], [address] [place], [place] [address]     Time coordinating discharge: 35 minutes  SIGNED:   Glade Lloyd, MD  [facility] 01/29/2024, 8:17 AM

## 2024-01-30 LAB — CULTURE, BLOOD (ROUTINE X 2)
Culture: NO GROWTH
Culture: NO GROWTH
Special Requests: ADEQUATE

## 2024-02-01 ENCOUNTER — Emergency Department (HOSPITAL_COMMUNITY)

## 2024-02-01 ENCOUNTER — Other Ambulatory Visit: Payer: Self-pay

## 2024-02-01 ENCOUNTER — Encounter (HOSPITAL_COMMUNITY): Payer: Self-pay | Admitting: Emergency Medicine

## 2024-02-01 ENCOUNTER — Inpatient Hospital Stay (HOSPITAL_COMMUNITY)
Admission: EM | Admit: 2024-02-01 | Discharge: 2024-02-10 | DRG: 025 | Disposition: A | Discharge status: Home / Self Care | Attending: Family Medicine | Admitting: Family Medicine

## 2024-02-01 DIAGNOSIS — D72829 Elevated white blood cell count, unspecified: Secondary | ICD-10-CM | POA: Diagnosis present

## 2024-02-01 DIAGNOSIS — I495 Sick sinus syndrome: Secondary | ICD-10-CM | POA: Diagnosis present

## 2024-02-01 DIAGNOSIS — Z7982 Long term (current) use of aspirin: Secondary | ICD-10-CM | POA: Diagnosis not present

## 2024-02-01 DIAGNOSIS — G8191 Hemiplegia, unspecified affecting right dominant side: Secondary | ICD-10-CM | POA: Diagnosis present

## 2024-02-01 DIAGNOSIS — E876 Hypokalemia: Secondary | ICD-10-CM | POA: Diagnosis not present

## 2024-02-01 DIAGNOSIS — I669 Occlusion and stenosis of unspecified cerebral artery: Secondary | ICD-10-CM | POA: Diagnosis not present

## 2024-02-01 DIAGNOSIS — K5900 Constipation, unspecified: Secondary | ICD-10-CM | POA: Diagnosis present

## 2024-02-01 DIAGNOSIS — R1310 Dysphagia, unspecified: Secondary | ICD-10-CM | POA: Diagnosis present

## 2024-02-01 DIAGNOSIS — H913 Deaf nonspeaking, not elsewhere classified: Secondary | ICD-10-CM | POA: Diagnosis present

## 2024-02-01 DIAGNOSIS — Z8673 Personal history of transient ischemic attack (TIA), and cerebral infarction without residual deficits: Secondary | ICD-10-CM

## 2024-02-01 DIAGNOSIS — G09 Sequelae of inflammatory diseases of central nervous system: Secondary | ICD-10-CM | POA: Diagnosis present

## 2024-02-01 DIAGNOSIS — Z5971 Insufficient health insurance coverage: Secondary | ICD-10-CM

## 2024-02-01 DIAGNOSIS — J9600 Acute respiratory failure, unspecified whether with hypoxia or hypercapnia: Secondary | ICD-10-CM | POA: Diagnosis not present

## 2024-02-01 DIAGNOSIS — G08 Intracranial and intraspinal phlebitis and thrombophlebitis: Principal | ICD-10-CM

## 2024-02-01 DIAGNOSIS — E785 Hyperlipidemia, unspecified: Secondary | ICD-10-CM | POA: Diagnosis present

## 2024-02-01 DIAGNOSIS — I676 Nonpyogenic thrombosis of intracranial venous system: Secondary | ICD-10-CM | POA: Diagnosis present

## 2024-02-01 DIAGNOSIS — R0689 Other abnormalities of breathing: Secondary | ICD-10-CM | POA: Diagnosis not present

## 2024-02-01 DIAGNOSIS — G936 Cerebral edema: Secondary | ICD-10-CM | POA: Diagnosis present

## 2024-02-01 LAB — CBC
HCT: 50.3 % (ref 39.0–52.0)
Hemoglobin: 16.1 g/dL (ref 13.0–17.0)
MCH: 26.8 pg (ref 26.0–34.0)
MCHC: 32 g/dL (ref 30.0–36.0)
MCV: 83.8 fL (ref 80.0–100.0)
Platelets: 260 10*3/uL (ref 150–400)
RBC: 6 MIL/uL — ABNORMAL HIGH (ref 4.22–5.81)
RDW: 13.2 % (ref 11.5–15.5)
WBC: 14.1 10*3/uL — ABNORMAL HIGH (ref 4.0–10.5)
nRBC: 0 % (ref 0.0–0.2)

## 2024-02-01 LAB — I-STAT CHEM 8, ED
BUN: 25 mg/dL — ABNORMAL HIGH (ref 6–20)
Calcium, Ion: 1.05 mmol/L — ABNORMAL LOW (ref 1.15–1.40)
Chloride: 110 mmol/L (ref 98–111)
Creatinine, Ser: 1 mg/dL (ref 0.61–1.24)
Glucose, Bld: 133 mg/dL — ABNORMAL HIGH (ref 70–99)
HCT: 47 % (ref 39.0–52.0)
Hemoglobin: 16 g/dL (ref 13.0–17.0)
Potassium: 3.8 mmol/L (ref 3.5–5.1)
Sodium: 141 mmol/L (ref 135–145)
TCO2: 22 mmol/L (ref 22–32)

## 2024-02-01 LAB — DIFFERENTIAL
Abs Immature Granulocytes: 0.17 10*3/uL — ABNORMAL HIGH (ref 0.00–0.07)
Basophils Absolute: 0 10*3/uL (ref 0.0–0.1)
Basophils Relative: 0 %
Eosinophils Absolute: 0.2 10*3/uL (ref 0.0–0.5)
Eosinophils Relative: 1 %
Immature Granulocytes: 1 %
Lymphocytes Relative: 11 %
Lymphs Abs: 1.6 10*3/uL (ref 0.7–4.0)
Monocytes Absolute: 0.9 10*3/uL (ref 0.1–1.0)
Monocytes Relative: 6 %
Neutro Abs: 11.3 10*3/uL — ABNORMAL HIGH (ref 1.7–7.7)
Neutrophils Relative %: 81 %

## 2024-02-01 LAB — COMPREHENSIVE METABOLIC PANEL
ALT: 76 U/L — ABNORMAL HIGH (ref 0–44)
AST: 20 U/L (ref 15–41)
Albumin: 4 g/dL (ref 3.5–5.0)
Alkaline Phosphatase: 44 U/L (ref 38–126)
Anion gap: 11 (ref 5–15)
BUN: 21 mg/dL — ABNORMAL HIGH (ref 6–20)
CO2: 21 mmol/L — ABNORMAL LOW (ref 22–32)
Calcium: 9.6 mg/dL (ref 8.9–10.3)
Chloride: 107 mmol/L (ref 98–111)
Creatinine, Ser: 0.98 mg/dL (ref 0.61–1.24)
GFR, Estimated: >60 mL/min (ref >60)
Glucose, Bld: 135 mg/dL — ABNORMAL HIGH (ref 70–99)
Potassium: 3.8 mmol/L (ref 3.5–5.1)
Sodium: 139 mmol/L (ref 135–145)
Total Bilirubin: 1.3 mg/dL — ABNORMAL HIGH (ref 0.0–1.2)
Total Protein: 7.6 g/dL (ref 6.5–8.1)

## 2024-02-01 LAB — CBG MONITORING, ED: Glucose-Capillary: 129 mg/dL — ABNORMAL HIGH (ref 70–99)

## 2024-02-01 LAB — PROTIME-INR
INR: 1 (ref 0.8–1.2)
Prothrombin Time: 13.3 s (ref 11.4–15.2)

## 2024-02-01 LAB — APTT: aPTT: 23 s — ABNORMAL LOW (ref 24–36)

## 2024-02-01 LAB — MRSA NEXT GEN BY PCR, NASAL: MRSA by PCR Next Gen: NOT DETECTED

## 2024-02-01 LAB — ETHANOL: Alcohol, Ethyl (B): <10 mg/dL (ref <10)

## 2024-02-01 MED ORDER — POLYETHYLENE GLYCOL 3350 17 G PO PACK
17.0000 g | PACK | Freq: Every day | ORAL | Status: DC | PRN
  Administered 2024-02-02: 17 g via ORAL
  Filled 2024-02-01: qty 1

## 2024-02-01 MED ORDER — SODIUM CHLORIDE 0.9 % IV SOLN
100.0000 mg | Freq: Two times a day (BID) | INTRAVENOUS | Status: DC
  Administered 2024-02-01: 100 mg via INTRAVENOUS
  Filled 2024-02-01 (×2): qty 100

## 2024-02-01 MED ORDER — CHLORHEXIDINE GLUCONATE CLOTH 2 % EX PADS
6.0000 | MEDICATED_PAD | Freq: Every day | CUTANEOUS | Status: DC
  Administered 2024-02-01 – 2024-02-04 (×4): 6 via TOPICAL

## 2024-02-01 MED ORDER — DOCUSATE SODIUM 100 MG PO CAPS
100.0000 mg | ORAL_CAPSULE | Freq: Two times a day (BID) | ORAL | Status: DC | PRN
  Administered 2024-02-02: 100 mg via ORAL
  Filled 2024-02-01: qty 1

## 2024-02-01 MED ORDER — ORAL CARE MOUTH RINSE: 15.0000 mL | OROMUCOSAL | Status: DC | PRN

## 2024-02-01 MED ORDER — IOHEXOL 350 MG/ML SOLN
100.0000 mL | Freq: Once | INTRAVENOUS | Status: AC | PRN
  Administered 2024-02-01: 100 mL via INTRAVENOUS

## 2024-02-01 MED ORDER — HEPARIN (PORCINE) 25000 UT/250ML-% IV SOLN
1000.0000 [IU]/h | INTRAVENOUS | Status: DC
  Administered 2024-02-01: 900 [IU]/h via INTRAVENOUS
  Administered 2024-02-02: 1000 [IU]/h via INTRAVENOUS
  Filled 2024-02-01 (×2): qty 250

## 2024-02-01 NOTE — ED Triage Notes (Signed)
 Upon arrival to room, family is present, family states to this RN that pt has not "pooped" in 8 days and they believe this is the reason for his current state.

## 2024-02-01 NOTE — ED Triage Notes (Signed)
 Pt to ER via EMS with reports of being normal at 11AM then at 11:30 vomited, c/o headache and being dizzy.  Came out of BR and laid on floor and was unable to get up again. Pt is deaf and communication is limited. Initial neurological exam was limited due to communication deficits, possible drift to right leg and weakness to right grip.  Unable to assess language.  Pt immediately to CT, neuro at bedside, exam completed in CT.  See initial NIHSS for further details.

## 2024-02-01 NOTE — Code Documentation (Addendum)
 Stroke Response Nurse Documentation Code Documentation  Samuel Warren is a 33 y.o. male arriving to [place]  on 02/01/2024. On No antithrombotic. Code stroke was activated by EMS.   Patient from home where he was LKW at 1100 and now complaining of headache, dizziness and vomiting.   Stroke team at the bedside on patient arrival. Labs drawn and patient cleared for CT by Dr. Jacqulyn Bath. Patient to CT with team.   NIHSS 5, see documentation for details and code stroke times. Patient with disoriented, bilateral leg weakness, and Global aphasia  on exam.   The following imaging was completed:  CT Head and CTA.   Patient is not a candidate for IV Thrombolytic due to LKW 1100. Patient is not a candidate for IR due to imaging negative for LVO.     Care Plan: q2h NIHSS and VS.   Process Delays Noted: Pt deaf, requiring ASL interpreter.  Bedside handoff with ED RN Victorino Dike.    Sayid Moll L Atisha Hamidi  Rapid Response RN

## 2024-02-01 NOTE — Progress Notes (Signed)
 PHARMACY - ANTICOAGULATION CONSULT NOTE  Pharmacy Consult for heparin Indication:  DVST   No Known Allergies  Patient Measurements: Height: 5\' 4"  (162.6 cm) Weight: 77 kg (169 lb 12.1 oz) IBW/kg (Calculated) : 59.2 Heparin Dosing Weight: 74.9  Vital Signs: Temp: 98.6 F (37 C) (03/23 1900) BP: 127/87 (03/23 1900) Pulse Rate: 53 (03/23 1900)  Labs: Recent Labs    02/01/24 1811 02/01/24 1816  HGB 16.1 16.0  HCT 50.3 47.0  PLT 260  --   APTT 23*  --   LABPROT 13.3  --   INR 1.0  --   CREATININE 0.98 1.00    Estimated Creatinine Clearance: 97.6 mL/min (by C-G formula based on SCr of 1 mg/dL).   Medical History: Past Medical History:  Diagnosis Date   Deaf     Assessment: Patient presented as stroke alert with CC of dizziness, nausea and headache. CTA showing dural sinus thrombosis. Not on anticoagulation prior to admission. HgB 16.0 and PLTs 260. Pharmacy consulted to dose heparin gtt.   Goal of Therapy:  Heparin level 0.3-0.5 units/ml Monitor platelets by anticoagulation protocol: Yes   Plan:  No bolus per consult.  Start heparin infusion at 900 units/hr Check anti-Xa level in 6 hours and daily while on heparin Continue to monitor H&H and platelets  Estill Batten, PharmD, BCCCP  02/01/2024,7:26 PM

## 2024-02-01 NOTE — Consult Note (Signed)
 NEUROLOGY CONSULT NOTE   Date of service: February 01, 2024 Patient Name: Samuel Warren MRN:  [id number] DOB:  Dec 29, 1990 Chief Complaint: Acute onset of nausea, vomiting, dizziness and right sided weakness Requesting Provider: Maia Plan, MD  History of Present Illness  Raheel Kunkle is a 33 y.o. 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 He has been having headaches since then. This morning, he woke up normal and was LKN by family at 4 AM. He then communicated to family at 11:30 AM that he was dizzy, with headache, nausea and also some vomiting. Family laid him down and covered him with sheets, eventually calling EMS. On arrival to the home, EMS noted right sided weakness. Code Stroke was called en route to the ED.   On arrival to the ED, RUE and RLE weakness was noted. Sign-language interpreter assisted with communication with the patient.   CT head reveals multiple thrombosed cerebral veins including the SSS, straight sinus, inferior sagittal sinus and both transverse sinuses left worse than right. No acute hypodensity is seen.   LKW: 1100 Modified rankin score: 0-Completely asymptomatic and back to baseline post- stroke IV Thrombolysis:  No: Outside of the TNK time window EVT: No: CTA is negative for LVO  Further information regarding his presentation and clinical course last admission is obtained from his 3/20 discharge summary: "33 y.o. male with medical history significant of deafness presented with severe headache and neck pain after returning from a hunting trip.  On presentation, patient underwent CT angiogram of the head and neck followed by lumbar puncture.  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  Neurology and ID were consulted.  Neurology recommended outpatient follow-up with neurology.  Patient was initially on broad-spectrum antibiotics but ID has narrowed it to doxycycline alone and recommended total of 10-day course of therapy.  Headache is improved and patient is ambulating well with no focal neurologic deficit.  He feels okay to go home today.  Discharge patient home today on oral doxycycline.  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 ID has narrowed it to doxycycline alone and recommended total of 10-day course of therapy. MRI as above.  Underwent LP as well: Cultures negative so far.  Headache improving. -patient is ambulating well with no focal neurologic deficit.  He feels okay to go home today.  Discharge patient home today on oral doxycycline for 7 more days.  Outpatient follow-up with PCP/neurology/ID. -IV Decadron has been discontinued as well.  Incidental finding of acute infarcts in the right frontal white matter -Likely due to infectious vasculitis versus small vessel disease in the setting of CNS infection.  CTA head and neck showed no LVO -Echo showed EF of 45 to 50% -LDL 279.  Patient was been started on high intensity statin by neurology: Statin discontinued on 01/28/2024 because of rising LFTs after neurology said that once LFTs improve, statin rechallenge can be done as an outpatient. -A1c 5.7. -Continue aspirin 81 mg daily.  Outpatient follow-up with neurology. -Tolerating diet.  No PT/OT follow-up needed as per PT and OT"   NIHSS components Score: Comment  1a Level of Conscious 0[x]  1[]  2[]  3[]      1b  LOC Questions 0[]  1[]  2[x]      Cannot give the month or his age with sign language  1c LOC Commands 0[x]  1[]  2[]      Can follow 2 pantomimed commands  2 Best Gaze 0[x]  1[]  2[]       3 Visual 0[x]  1[]  2[]  3[]      4 Facial Palsy 0[x]  1[]  2[]  3[]     Decreased NL fold on the right  5a Motor Arm - left 0[x]  1[]  2[]  3[]  4[]  UN[]    5b Motor Arm - Right 0[]  1[x]  2[]  3[]  4[]  UN[]    6a Motor Leg - Left 0[x]  1[]  2[]  3[]  4[]  UN[]    6b Motor Leg - Right 0[]  1[x]  2[]  3[]  4[]  UN[]    Bobbing drift  7 Limb Ataxia 0[x]  1[]  2[]  3[]  UN[]     8 Sensory 0[x]  1[]  2[]  UN[]      9 Best Language 0[x]  1[]  2[]  3[]      10 Dysarthria 0[]  1[]  2[]  UN[x]      11 Extinct. and Inattention 0[x]  1[]  2[]       TOTAL:    4      ROS  Detailed ROS deferred due to acuity of presentation.    Past History   Past Medical History:  Diagnosis Date   Deaf     No past surgical history on file.  Family History: No family history on file.  Social History  reports that he has never smoked. He has never used smokeless tobacco. He reports that he does not currently use alcohol. He reports that he does not currently use drugs.  No Known Allergies  Medications  No current facility-administered medications for this encounter.  Current Outpatient Medications:    acetaminophen (TYLENOL) 500 MG tablet, Take 1,000 mg by mouth every 8 (eight) hours as needed for headache., Disp: , Rfl:    aspirin EC 81 MG tablet, Take 1 tablet (81 mg total) by mouth daily. Swallow whole., Disp: 30 tablet, Rfl: 0   doxycycline (VIBRA-TABS) 100 MG tablet, Take 1 tablet (100 mg total) by mouth 2 (two) times daily with a meal for 7 days., Disp: 14 tablet, Rfl: 0   ondansetron (ZOFRAN) 4 MG tablet, Take 1 tablet (4 mg total) by mouth every 6 (six) hours as needed for nausea., Disp: 20 tablet, Rfl: 0  Vitals  There were no vitals filed for this visit.  There is no height or weight on file to calculate BMI.  Physical Exam   CPhysical Exam  HEENT:   [place]/AT Lungs: Respirations unlabored Extremities: Warm and well perfused.   Neurological Examination Mental Status: Awake and alert. Not oriented to his age or the month. Speech with sign language is nonfluent per the sign language interpreter, but an interpreter subspecialist who was called to further assess felt that the pattern was most consistent with pre-existing lack of complete education with regard to signing using conventional signs.  He is able to follow all signed commands and answers questions with sign language in an organized and comprehensible fashion given the above. He can name multiple items from the NIHSS naming card.   Cranial Nerves: II: Visual fields intact bilaterally.    III,IV, VI: No ptosis. EOMI. No nystagmus.  V: FT sensation equal bilaterally VII: Smile symmetric VIII: Deaf IX,X: Gag reflex  deferred XI: Symmetric XII: Midline tongue extension Motor: RUE with 4/5 strength against resistance, drift and positive orbiting fingers test RLE with bobbing drift and 4/5 strength against resistance LUE 5/5 LLE 5/5 Sensory: Light touch intact throughout, bilaterally. No extinction to DSS Deep Tendon Reflexes: 2+ and symmetric throughout Cerebellar: No ataxia with FNF or H-S bilaterally Gait: Deferred  Labs/Imaging/Neurodiagnostic studies   CBC:  Recent Labs  Lab February 06, 2024 0310 01/29/24 0405  WBC 11.7* 13.4*  HGB 13.9 14.3  HCT 42.9 44.1  MCV 81.7 81.1  PLT 237 250   Basic Metabolic Panel:  Lab Results  Component Value Date   NA 136 01/29/2024   K 4.0 01/29/2024   CO2 18 (L) 01/29/2024   GLUCOSE 163 (H) 01/29/2024   BUN 32 (H) 01/29/2024   CREATININE 1.09 01/29/2024   CALCIUM 9.0 01/29/2024   GFRNONAA >60 01/29/2024   Lipid Panel:  Lab Results  Component Value Date   LDLCALC 279 (H) 01/27/2024   HgbA1c:  Lab Results  Component Value Date   HGBA1C 5.7 (H) 01/26/2024   Urine Drug Screen:     Component Value Date/Time   LABOPIA NONE DETECTED  01/27/2024 1109   COCAINSCRNUR NONE DETECTED 01/27/2024 1109   LABBENZ NONE DETECTED 01/27/2024 1109   AMPHETMU NONE DETECTED 01/27/2024 1109   THCU NONE DETECTED 01/27/2024 1109   LABBARB NONE DETECTED 01/27/2024 1109      ASSESSMENT  32 y.o. 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 He has been having headaches since then. This morning, he woke up normal and was LKN by family at 62 AM. He then communicated to family at 11:30 AM that he was dizzy, with headache, nausea and also some vomiting. Family laid him down and covered him with sheets, eventually calling EMS. On arrival to the home, EMS noted right sided weakness. Code Stroke was called en route to the ED. On arrival to the ED, RUE and RLE weakness was noted. Sign-language interpreter assisted with communication with the patient. CT head reveals multiple thrombosed cerebral veins including the SSS, straight sinus, inferior sagittal sinus and both transverse sinuses left worse than right. No acute hypodensity is seen.  - Exam reveals disorientation and mild right sided weakness. He communicates using rudimentary sign language, requiring an interpreter.  - CT head: Hyperdense straight sinus, inferior sagittal sinus, posterosuperior sagittal sinus, left transverse sinus, and left sigmoid sinus, suggesting diffuse dural sinus thrombosis. No acute infarct or parenchymal hemorrhage. Aspects is 10/10. - CTA of head and neck with CTP: Diffuse dural sinus thrombosis. Numerous cortical veins are opacified over the convexities bilaterally represent collateral venous flow to the patent anterior superior sagittal sinus. Diffuse decreased perfusion over the convexities bilaterally. - CTV: Extensive dural sinus thrombus within the posterior  aspect of the superior sagittal sinus, the straight sinus, the inferior sagittal sinus and left greater than right transverse sinus. Thrombus extends into the left sigmoid sinus and visualized left internal jugular vein. Innumerable enlarged and tortuous cortical veins are visualized, consistent with collateral venous drainage. Enlarged left petrosal veins are present in the middle cranial fossa. - EKG: Sinus bradycardia; Right axis deviation; Probable anteroseptal infarct, old; Minimal ST depression, diffuse leads; Minimal ST elevation, lateral leads - Labs: - WBC elevated at 14.1 - Na 139, K 3.8, glucose 135,  AST normal, ALT elevated at 76, total bilirubin elevated at 1.3, BUN elevated at 21, Cr normal, total calcium normal, ionized calcium low at 1.05 - INR and PT normal. PTT low at 23 - Respiratory panel last admission was positive for coronavirus 229E - Hepatitis panel last admission was negative - CSF last admission with WBC 22 (elevated neutrophil percentage), glucose 80, pink, colorless, elevated protein of 91. Meningitis/encephalitis panel was negative.  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 At elevated risk for development of acute venous infarction.    RECOMMENDATIONS  - IVF should be started given elevated BUN/Cr ratio and known meningoencephalitis - Called Pharmacy for assistance in starting heparin gtt for treatment of his cerebral venous sinus thrombosis. Neurology protocol with no bolus. Although there is risk for intracerebral hemorrhage should he sustain a venous stroke, the risk of morbidity/mortality from venous ischemic stroke itself is significantly higher - Frequent neuro checks - Will need to be admitted to the ICU for q1 hour neuro checks. Discussed with CCM who have agreed to admit the  patient.   - MRI brain (ordered) - Repeat CT head at midnight (ordered) - ID consult to ensure that his ABX regimen is optimized given that he has developed what most likely is a diffuse intracranial thrombophlebitis while on doxycycline.  - Discussed with Dr. Wilford Corner in sign out.   85 minutes spent in the emergent neurological evaluation and management of this critically ill patient  ______________________________________________________________________    Dessa Phi, Andrianna Manalang, MD [facility]

## 2024-02-01 NOTE — H&P (Signed)
 NAME:  Samuel Warren, MRN:  [id number], DOB:  January 23, 1991, LOS: 0 ADMISSION DATE:  02/01/2024, CONSULTATION DATE:  02/01/24 REFERRING MD: neuro, CHIEF COMPLAINT:  R weakness   History of Present Illness:  33 yo male with recent hospital admission and discharge 2/2 vasculitis and meningeal encephalitis req doxy after recent wild boar hunting trip per chart review and brother at bedside. Pt is unable to provide history 2/2 R UE deficits at this time and hearing impairment requiring him to utilize ASL but unfortunately at this time unable to. Pt appears comfortable but is much weaker on R upper and lower rather than L.   Per brother pt was discharged 2-3 days ago on doxy after being found to have meningitis. His mother called him and stated he was not doing well and needed to go to hospital. She described he was unable to move or respond (per the brother). She states he was last known normal at 1100.  He also reportedly complained of HA, N/V. Pt's family ultimately called EMS and code stroke initiated. CTH in ed revealed multiple venous thrombi without acute hypodensity. Neuro was called for admission and due to the lack of acute findings for cva but need for neuro checks q1hr ccm was asked for admission.     Pertinent  Medical History  Meningoencephalitis Hearing impairment   Significant Hospital Events: Including procedures, antibiotic start and stop dates in addition to other pertinent events   Readmitted to ICU 3/23  Interim History / Subjective:    Objective   Blood pressure 127/87, pulse (!) 53, temperature 98.6 F (37 C), resp. rate 18, height 5\' 4"  (1.626 m), weight 77 kg, SpO2 98%.       No intake or output data in the 24 hours ending 02/01/24 2003 Filed Weights   02/01/24 1901  Weight: 77 kg    Examination: General: awake, appears comfortable, favoring L side, non verbal  HENT: ncat. Eomi, perrla, does req prompting for attention to R, mmmp Lungs: ctab Cardiovascular: rrr  no m/g/r Abdomen: soft, nt,nd bs + Extremities: no c/c/e Neuro: R deficits, can move fingers but otherwise no antigravity. RLE responds to painful stim. Non verbal at baseline. 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 constipation -admit to icu per neuro recs for neuro checks qh -cont doxy per discharge orders from recent admission -neuro has consulted ID -blood cx for completeness sake -f/u MRI -cont heparin ingusion -repeat cth with any change and at 2400 per neuro -will need PT/OT -will need aggressive bowel regimen once able to take po but for now pt defers suppository Best Practice (right click and "Reselect all SmartList Selections" daily)   Diet/type: NPO DVT prophylaxis systemic heparin Pressure ulcer(s): N/A GI prophylaxis: N/A Lines: N/A Foley:  N/A Code Status:  full code Last date of multidisciplinary goals of care discussion [per brother at bedside. ]  Labs   CBC: Recent Labs  Lab 01/26/24 0349 01/28/24 0310 01/29/24 0405 02/01/24 1811 02/01/24 1816  WBC 6.8 11.7* 13.4* 14.1*  --   NEUTROABS  --   --   --  11.3*  --   HGB 14.0 13.9 14.3 16.1 16.0  HCT 43.6 42.9 44.1 50.3 47.0  MCV 82.9 81.7 81.1 83.8  --   PLT 197 237 250 260  --     Basic Metabolic Panel: Recent Labs  Lab 01/26/24 0349 01/28/24  0310 01/29/24 0405 02/01/24 1811 02/01/24 1816  NA 137 137 136 139 141  K 4.0 3.7 4.0 3.8 3.8  CL 107 108 109 107 110  CO2 18* 22 18* 21*  --   GLUCOSE 141* 165* 163* 135* 133*  BUN 10 23* 32* 21* 25*  CREATININE 1.05 1.01 1.09 0.98 1.00  CALCIUM 9.4 9.2 9.0 9.6  --   MG  --   --  2.2  --   --    GFR: Estimated Creatinine Clearance: 97.6 mL/min (by C-G formula based on SCr of 1 mg/dL). Recent Labs  Lab 01/26/24 0349 01/28/24 0310 01/29/24 0405 02/01/24 1811  WBC 6.8 11.7* 13.4* 14.1*    Liver  Function Tests: Recent Labs  Lab 01/26/24 0349 01/28/24 0310 01/29/24 0405 02/01/24 1811  AST 51* 75* 39 20  ALT 120* 203* 156* 76*  ALKPHOS 51 49 47 44  BILITOT 0.9 1.0 0.8 1.3*  PROT 7.8 7.2 7.0 7.6  ALBUMIN 3.8 3.5 3.5 4.0   No results for input(s): "LIPASE", "AMYLASE" in the last 168 hours. No results for input(s): "AMMONIA" in the last 168 hours.  ABG    Component Value Date/Time   TCO2 22 02/01/2024 1816     Coagulation Profile: Recent Labs  Lab 02/01/24 1811  INR 1.0    Cardiac Enzymes: No results for input(s): "CKTOTAL", "CKMB", "CKMBINDEX", "TROPONINI" in the last 168 hours.  HbA1C: Hgb A1c MFr Bld  Date/Time Value Ref Range Status  01/26/2024 08:47 PM 5.7 (H) 4.8 - 5.6 % Final    Comment:    (NOTE) Pre diabetes:          5.7%-6.4%  Diabetes:              >6.4%  Glycemic control for   <7.0% adults with diabetes     CBG: Recent Labs  Lab 01/26/24 1524 02/01/24 1805  GLUCAP 117* 129*    Review of Systems:   Unobtainable 2/2 pt's non verbal state and R sided deficits.   Past Medical History:  He,  has a past medical history of Deaf.   Surgical History:  History reviewed. No pertinent surgical history.   Social History:   reports that he has never smoked. He has never used smokeless tobacco. He reports that he does not currently use alcohol. He reports that he does not currently use drugs.   Family History:  His family history is not on file.   Allergies No Known Allergies   Home Medications  Prior to Admission medications   Medication Sig Start Date End Date Taking? Authorizing Provider  acetaminophen (TYLENOL) 500 MG tablet Take 1,000 mg by mouth every 8 (eight) hours as needed for headache.    [provider]  aspirin EC 81 MG tablet Take 1 tablet (81 mg total) by mouth daily. Swallow whole. 01/29/24   Glade Lloyd, MD  doxycycline (VIBRA-TABS) 100 MG tablet Take 1 tablet (100 mg total) by mouth 2 (two) times daily with  a meal for 7 days. 01/29/24 02/05/24  Glade Lloyd, MD  ondansetron (ZOFRAN) 4 MG tablet Take 1 tablet (4 mg total) by mouth every 6 (six) hours as needed for nausea. 01/29/24   Glade Lloyd, MD     Critical care time: 

## 2024-02-01 NOTE — ED Provider Notes (Signed)
 Emergency Department Provider Note   I have reviewed the triage vital signs and the nursing notes.   HISTORY  Chief Complaint No chief complaint on file.   HPI Samuel Warren is a 33 y.o. male with past history reviewed below including recent admit for meningitis/encephalitis presents with acute onset mental status change and right upper extremity weakness.  He communicates with sign language but unclear if ASL is appropriate for him.  Some difficulty interacting during the initial stroke evaluation.  Last normal at 11:30 AM.  Arrives as a code stroke by EMS.  Level 5 caveat: AMS   Past Medical History:  Diagnosis Date   Deaf     Review of Systems  Level 5 caveat: AMS ____________________________________________   PHYSICAL EXAM:  VITAL SIGNS: BP: 108/65 Temp: 98.1 F Pulse: 59 Resp: 18 SpO2: 99%   Constitutional: Alert but appears somewhat confused.  Eyes: Conjunctivae are normal.  Head: Atraumatic. Nose: No congestion/rhinnorhea. Mouth/Throat: Mucous membranes are moist.   Neck: No stridor.   Cardiovascular: Normal rate, regular rhythm. Good peripheral circulation. Grossly normal heart sounds.   Respiratory: Normal respiratory effort.  No retractions. Lungs CTAB. Gastrointestinal: Soft and nontender. No distention.  Musculoskeletal: No lower extremity tenderness nor edema. No gross deformities of extremities. Neurologic: patient with weakness in the RUE and RLE compared to left.  Skin:  Skin is warm, dry and intact. No rash noted.  ____________________________________________   LABS (all labs ordered are listed, but only abnormal results are displayed)  Labs Reviewed  APTT - Abnormal; Notable for the following components:      Result Value   aPTT 23 (*)    All other components within normal limits  CBC - Abnormal; Notable for the following components:   WBC 14.1 (*)    RBC 6.00 (*)    All other components within normal limits  DIFFERENTIAL -  Abnormal; Notable for the following components:   Neutro Abs 11.3 (*)    Abs Immature Granulocytes 0.17 (*)    All other components within normal limits  COMPREHENSIVE METABOLIC PANEL - Abnormal; Notable for the following components:   CO2 21 (*)    Glucose, Bld 135 (*)    BUN 21 (*)    ALT 76 (*)    Total Bilirubin 1.3 (*)    All other components within normal limits  URINALYSIS, ROUTINE W REFLEX MICROSCOPIC - Abnormal; Notable for the following components:   Color, Urine AMBER (*)    All other components within normal limits  HEPARIN LEVEL (UNFRACTIONATED) - Abnormal; Notable for the following components:   Heparin Unfractionated 0.21 (*)    All other components within normal limits  CBC - Abnormal; Notable for the following components:   WBC 15.2 (*)    All other components within normal limits  BASIC METABOLIC PANEL - Abnormal; Notable for the following components:   Chloride 115 (*)    CO2 21 (*)    Glucose, Bld 109 (*)    Calcium 8.3 (*)    Anion gap 4 (*)    All other components within normal limits  CBC WITH DIFFERENTIAL/PLATELET - Abnormal; Notable for the following components:   Hemoglobin 12.1 (*)    HCT 37.5 (*)    Neutro Abs 8.3 (*)    Abs Immature Granulocytes 0.16 (*)    All other components within normal limits  HEPARIN LEVEL (UNFRACTIONATED) - Abnormal; Notable for the following components:   Heparin Unfractionated >1.10 (*)    All other  components within normal limits  TRIGLYCERIDES - Abnormal; Notable for the following components:   Triglycerides 155 (*)    All other components within normal limits  HEPARIN LEVEL (UNFRACTIONATED) - Abnormal; Notable for the following components:   Heparin Unfractionated 0.85 (*)    All other components within normal limits  HEPARIN LEVEL (UNFRACTIONATED) - Abnormal; Notable for the following components:   Heparin Unfractionated <0.10 (*)    All other components within normal limits  CBC - Abnormal; Notable for the  following components:   RBC 4.17 (*)    Hemoglobin 11.1 (*)    HCT 34.9 (*)    All other components within normal limits  BASIC METABOLIC PANEL - Abnormal; Notable for the following components:   Potassium 3.4 (*)    CO2 21 (*)    Calcium 8.4 (*)    All other components within normal limits  HEPARIN LEVEL (UNFRACTIONATED) - Abnormal; Notable for the following components:   Heparin Unfractionated 0.27 (*)    All other components within normal limits  CBC - Abnormal; Notable for the following components:   Hemoglobin 11.6 (*)    HCT 35.2 (*)    All other components within normal limits  BASIC METABOLIC PANEL - Abnormal; Notable for the following components:   Glucose, Bld 102 (*)    Calcium 8.5 (*)    All other components within normal limits  BASIC METABOLIC PANEL WITH GFR - Abnormal; Notable for the following components:   Glucose, Bld 142 (*)    Calcium 8.7 (*)    All other components within normal limits  CBC - Abnormal; Notable for the following components:   Hemoglobin 12.9 (*)    All other components within normal limits  I-STAT CHEM 8, ED - Abnormal; Notable for the following components:   BUN 25 (*)    Glucose, Bld 133 (*)    Calcium, Ion 1.05 (*)    All other components within normal limits  CBG MONITORING, ED - Abnormal; Notable for the following components:   Glucose-Capillary 129 (*)    All other components within normal limits  POCT I-STAT 7, (LYTES, BLD GAS, ICA,H+H) - Abnormal; Notable for the following components:   pO2, Arterial 495 (*)    Acid-base deficit 5.0 (*)    Calcium, Ion 1.13 (*)    HCT 38.0 (*)    Hemoglobin 12.9 (*)    All other components within normal limits  CULTURE, BLOOD (ROUTINE X 2)  CULTURE, BLOOD (ROUTINE X 2)  MRSA NEXT GEN BY PCR, NASAL  ETHANOL  PROTIME-INR  RAPID URINE DRUG SCREEN, HOSP PERFORMED  MAGNESIUM  HEPARIN LEVEL (UNFRACTIONATED)  HEPARIN LEVEL (UNFRACTIONATED)  MAGNESIUM  PHOSPHORUS  MAGNESIUM  PHOSPHORUS   MAGNESIUM  PHOSPHORUS  MAGNESIUM  POCT ACTIVATED CLOTTING TIME   ____________________________________________  EKG   EKG Interpretation Date/Time:  Sunday February 01 2024 18:56:38 EDT Ventricular Rate:  49 PR Interval:  156 QRS Duration:  103 QT Interval:  446 QTC Calculation: 403 R Axis:   130  Text Interpretation: Sinus bradycardia Right axis deviation Probable anteroseptal infarct, old Minimal ST depression, diffuse leads Minimal ST elevation, lateral leads Confirmed by Marily Memos [phone]) on 02/02/2024 7:16:55 PM       ____________________________________________   PROCEDURES  Procedure(s) performed:   Procedures  CRITICAL CARE Performed by: Maia Plan Total critical care time: 35 minutes Critical care time was exclusive of separately billable procedures and treating other patients. Critical care was necessary to treat or prevent  imminent or life-threatening deterioration. Critical care was time spent personally by me on the following activities: development of treatment plan with patient and/or surrogate as well as nursing, discussions with consultants, evaluation of patient's response to treatment, examination of patient, obtaining history from patient or surrogate, ordering and performing treatments and interventions, ordering and review of laboratory studies, ordering and review of radiographic studies, pulse oximetry and re-evaluation of patient's condition.  Alona Bene, MD Emergency Medicine  ____________________________________________   INITIAL IMPRESSION / ASSESSMENT AND PLAN / ED COURSE  Pertinent labs & imaging results that were available during my care of the patient were reviewed by me and considered in my medical decision making (see chart for details).   This patient is Presenting for Evaluation of AMS, which does require a range of treatment options, and is a complaint that involves a high risk of morbidity and mortality.  The Differential  Diagnoses includes but is not exclusive to alcohol, illicit or prescription medications, intracranial pathology such as stroke, intracerebral hemorrhage, fever or infectious causes including sepsis, hypoxemia, uremia, trauma, endocrine related disorders such as diabetes, hypoglycemia, thyroid-related diseases, etc.   Critical Interventions-    Medications  docusate sodium (COLACE) capsule 100 mg ( Oral MAR Unhold 02/02/24 1921)  polyethylene glycol (MIRALAX / GLYCOLAX) packet 17 g ( Oral MAR Unhold 02/02/24 1921)  Chlorhexidine Gluconate Cloth 2 % PADS 6 each (6 each Topical Not Given 02/06/24 1007)  Oral care mouth rinse ( Mouth Rinse MAR Unhold 02/02/24 1921)  bisacodyl (DULCOLAX) suppository 10 mg (10 mg Rectal Given 02/03/24 1713)  acetaminophen (TYLENOL) tablet 650 mg (650 mg Oral Given 02/06/24 1014)    Or  acetaminophen (TYLENOL) 160 MG/5ML solution 650 mg ( Per Tube See Alternative 02/06/24 1014)    Or  acetaminophen (TYLENOL) suppository 650 mg ( Rectal See Alternative 02/06/24 1014)  clevidipine (CLEVIPREX) infusion 0.5 mg/mL ( Intravenous Not Given 02/03/24 0606)  0.9 %  sodium chloride infusion ( Intravenous Stopped 02/03/24 1855)  Oral care mouth rinse (has no administration in time range)  acetaminophen (OFIRMEV) IV 1,000 mg (0 mg Intravenous Stopped 02/03/24 1910)  feeding supplement (ENSURE ENLIVE / ENSURE PLUS) liquid 237 mL (237 mLs Oral Given 02/06/24 1500)  0.9 %  sodium chloride infusion (0 mLs Intravenous Stopped 02/05/24 1530)  oxyCODONE (Oxy IR/ROXICODONE) immediate release tablet 5 mg (5 mg Oral Given 02/05/24 2009)  apixaban (ELIQUIS) tablet 10 mg (10 mg Oral Given 02/06/24 1007)    Followed by  apixaban (ELIQUIS) tablet 5 mg (has no administration in time range)  atorvastatin (LIPITOR) tablet 80 mg (80 mg Oral Given 02/06/24 1007)  multivitamin with minerals tablet 1 tablet (1 tablet Oral Given 02/06/24 1007)  iohexol (OMNIPAQUE) 350 MG/ML injection 100 mL (100 mLs Intravenous  Contrast Given 02/01/24 1845)  calcium gluconate 1 g/ 50 mL sodium chloride IVPB (0 mg Intravenous Stopped 02/02/24 1151)  ceFAZolin (ANCEF) IVPB 2g/100 mL premix (2 g Intravenous Given 02/02/24 1835)  iohexol (OMNIPAQUE) 300 MG/ML solution 150 mL (75 mLs Intra-arterial Contrast Given 02/02/24 1843)  iohexol (OMNIPAQUE) 300 MG/ML solution 150 mL (75 mLs Intra-arterial Contrast Given 02/02/24 1720)  potassium chloride 10 mEq in 100 mL IVPB (0 mEq Intravenous Stopped 02/03/24 1352)  potassium chloride SA (KLOR-CON M) CR tablet 40 mEq (40 mEq Oral Given 02/04/24 1002)    Reassessment after intervention:  AMS unchanged.    I did obtain Additional Historical Information from EMS.    Clinical Laboratory Tests Ordered, included UDS negative.  UA  without infection.  Leukocytosis to 15.  No acute kidney injury.  Radiologic Tests Ordered, included CT head. I independently interpreted the images and agree with radiology interpretation.   Cardiac Monitor Tracing which shows NSR.    Social Determinants of Health Risk patient is a non-smoker.    Consult complete with Neurology. Dural sinus thrombosis identified on CT/MR.  Neurology has consulted critical care for admission.  Medical Decision Making: Summary:  Patient arrives as a code stroke with acute onset mental status change and right-sided weakness.  Evaluated with the neurology team upon arrival and taken for imaging.  Airway patent.  Reevaluation with update and discussion with patient and family at bedside.  Plan for admit.  Dural sinus thrombosis on CT venogram.   Patient's presentation is most consistent with acute presentation with potential threat to life or bodily function.   Disposition: admit  ____________________________________________  FINAL CLINICAL IMPRESSION(S) / ED DIAGNOSES  Final diagnoses:  Acute cerebral venous sinus thrombosis     Note:  This document was prepared using Dragon voice recognition software and may  include unintentional dictation errors.  Alona Bene, MD, [facility] Emergency Medicine    Saadiya Wilfong, Arlyss Repress, MD 02/06/24 [phone]

## 2024-02-02 ENCOUNTER — Other Ambulatory Visit (HOSPITAL_COMMUNITY): Payer: Self-pay

## 2024-02-02 ENCOUNTER — Telehealth (HOSPITAL_COMMUNITY): Payer: Self-pay | Admitting: Pharmacy Technician

## 2024-02-02 ENCOUNTER — Inpatient Hospital Stay (HOSPITAL_COMMUNITY)

## 2024-02-02 ENCOUNTER — Encounter (HOSPITAL_COMMUNITY)
Admission: EM | Disposition: A | Discharge status: Home / Self Care | Payer: Self-pay | Source: Home / Self Care | Attending: Family Medicine

## 2024-02-02 ENCOUNTER — Encounter (HOSPITAL_COMMUNITY): Payer: Self-pay | Admitting: Critical Care Medicine

## 2024-02-02 DIAGNOSIS — G08 Intracranial and intraspinal phlebitis and thrombophlebitis: Secondary | ICD-10-CM

## 2024-02-02 DIAGNOSIS — I669 Occlusion and stenosis of unspecified cerebral artery: Secondary | ICD-10-CM

## 2024-02-02 HISTORY — PX: IR ANGIO INTRA EXTRACRAN SEL COM CAROTID INNOMINATE BILAT MOD SED: IMG5360

## 2024-02-02 HISTORY — PX: IR THROMBECT VENO MECH MOD SED: IMG2300

## 2024-02-02 HISTORY — PX: RADIOLOGY WITH ANESTHESIA: SHX6223

## 2024-02-02 HISTORY — PX: IR VENO/JUGULAR LEFT: IMG2274

## 2024-02-02 LAB — RAPID URINE DRUG SCREEN, HOSP PERFORMED
Amphetamines: NOT DETECTED
Barbiturates: NOT DETECTED
Benzodiazepines: NOT DETECTED
Cocaine: NOT DETECTED
Opiates: NOT DETECTED
Tetrahydrocannabinol: NOT DETECTED

## 2024-02-02 LAB — CBC
HCT: 45.2 % (ref 39.0–52.0)
Hemoglobin: 14.9 g/dL (ref 13.0–17.0)
MCH: 26.9 pg (ref 26.0–34.0)
MCHC: 33 g/dL (ref 30.0–36.0)
MCV: 81.7 fL (ref 80.0–100.0)
Platelets: 260 10*3/uL (ref 150–400)
RBC: 5.53 MIL/uL (ref 4.22–5.81)
RDW: 13.1 % (ref 11.5–15.5)
WBC: 15.2 10*3/uL — ABNORMAL HIGH (ref 4.0–10.5)
nRBC: 0 % (ref 0.0–0.2)

## 2024-02-02 LAB — HEPARIN LEVEL (UNFRACTIONATED)
Heparin Unfractionated: 0.21 [IU]/mL — ABNORMAL LOW (ref 0.30–0.70)
Heparin Unfractionated: 0.39 [IU]/mL (ref 0.30–0.70)
Heparin Unfractionated: 0.66 [IU]/mL (ref 0.30–0.70)

## 2024-02-02 LAB — URINALYSIS, ROUTINE W REFLEX MICROSCOPIC
Bilirubin Urine: NEGATIVE
Glucose, UA: NEGATIVE mg/dL
Hgb urine dipstick: NEGATIVE
Ketones, ur: NEGATIVE mg/dL
Leukocytes,Ua: NEGATIVE
Nitrite: NEGATIVE
Protein, ur: NEGATIVE mg/dL
Specific Gravity, Urine: 1.02 (ref 1.005–1.030)
pH: 5.5 (ref 5.0–8.0)

## 2024-02-02 LAB — POCT I-STAT 7, (LYTES, BLD GAS, ICA,H+H)
Acid-base deficit: 5 mmol/L — ABNORMAL HIGH (ref 0.0–2.0)
Bicarbonate: 20.6 mmol/L (ref 20.0–28.0)
Calcium, Ion: 1.13 mmol/L — ABNORMAL LOW (ref 1.15–1.40)
HCT: 38 % — ABNORMAL LOW (ref 39.0–52.0)
Hemoglobin: 12.9 g/dL — ABNORMAL LOW (ref 13.0–17.0)
O2 Saturation: 100 %
Patient temperature: 35.4
Potassium: 3.9 mmol/L (ref 3.5–5.1)
Sodium: 143 mmol/L (ref 135–145)
TCO2: 22 mmol/L (ref 22–32)
pCO2 arterial: 35.9 mmHg (ref 32–48)
pH, Arterial: 7.359 (ref 7.35–7.45)
pO2, Arterial: 495 mmHg — ABNORMAL HIGH (ref 83–108)

## 2024-02-02 LAB — MAGNESIUM: Magnesium: 2.3 mg/dL (ref 1.7–2.4)

## 2024-02-02 SURGERY — RADIOLOGY WITH ANESTHESIA
Anesthesia: General

## 2024-02-02 MED ORDER — IOHEXOL 300 MG/ML  SOLN
150.0000 mL | Freq: Once | INTRAMUSCULAR | Status: AC | PRN
  Administered 2024-02-02: 75 mL via INTRA_ARTERIAL

## 2024-02-02 MED ORDER — FENTANYL CITRATE PF 50 MCG/ML IJ SOSY
50.0000 ug | PREFILLED_SYRINGE | INTRAMUSCULAR | Status: DC | PRN
  Administered 2024-02-02 – 2024-02-03 (×3): 50 ug via INTRAVENOUS
  Filled 2024-02-02 (×4): qty 1

## 2024-02-02 MED ORDER — ROCURONIUM BROMIDE 10 MG/ML (PF) SYRINGE
PREFILLED_SYRINGE | INTRAVENOUS | Status: DC | PRN
Start: 2024-02-02 — End: 2024-02-02
  Administered 2024-02-02: 20 mg via INTRAVENOUS
  Administered 2024-02-02: 50 mg via INTRAVENOUS
  Administered 2024-02-02: 30 mg via INTRAVENOUS
  Administered 2024-02-02 (×2): 20 mg via INTRAVENOUS
  Administered 2024-02-02: 60 mg via INTRAVENOUS

## 2024-02-02 MED ORDER — ACETAMINOPHEN 160 MG/5ML PO SOLN: 650.0000 mg | ORAL | Status: DC | PRN

## 2024-02-02 MED ORDER — ORAL CARE MOUTH RINSE: 15.0000 mL | OROMUCOSAL | Status: DC | PRN

## 2024-02-02 MED ORDER — PROPOFOL 10 MG/ML IV BOLUS
INTRAVENOUS | Status: DC | PRN
  Administered 2024-02-02: 75 ug/kg/min via INTRAVENOUS
  Administered 2024-02-02: 150 mg via INTRAVENOUS

## 2024-02-02 MED ORDER — ONDANSETRON HCL 4 MG/2ML IJ SOLN
INTRAMUSCULAR | Status: DC | PRN
  Administered 2024-02-02: 4 mg via INTRAVENOUS

## 2024-02-02 MED ORDER — BISACODYL 10 MG RE SUPP
10.0000 mg | Freq: Every day | RECTAL | Status: DC | PRN
  Administered 2024-02-03: 10 mg via RECTAL
  Filled 2024-02-02: qty 1

## 2024-02-02 MED ORDER — LACTATED RINGERS IV SOLN: INTRAVENOUS | Status: DC | PRN

## 2024-02-02 MED ORDER — FENTANYL CITRATE (PF) 100 MCG/2ML IJ SOLN
INTRAMUSCULAR | Status: AC
  Filled 2024-02-02: qty 2

## 2024-02-02 MED ORDER — ACETAMINOPHEN 325 MG PO TABS
650.0000 mg | ORAL_TABLET | ORAL | Status: DC | PRN
  Administered 2024-02-04 – 2024-02-09 (×8): 650 mg via ORAL
  Filled 2024-02-02 (×8): qty 2

## 2024-02-02 MED ORDER — ORAL CARE MOUTH RINSE
15.0000 mL | OROMUCOSAL | Status: DC
  Administered 2024-02-03 (×10): 15 mL via OROMUCOSAL

## 2024-02-02 MED ORDER — PHENYLEPHRINE 80 MCG/ML (10ML) SYRINGE FOR IV PUSH (FOR BLOOD PRESSURE SUPPORT)
PREFILLED_SYRINGE | INTRAVENOUS | Status: DC | PRN
  Administered 2024-02-02 (×5): 80 ug via INTRAVENOUS

## 2024-02-02 MED ORDER — CLEVIDIPINE BUTYRATE 0.5 MG/ML IV EMUL
INTRAVENOUS | Status: AC
  Filled 2024-02-02: qty 50

## 2024-02-02 MED ORDER — CALCIUM GLUCONATE-NACL 1-0.675 GM/50ML-% IV SOLN
1.0000 g | Freq: Once | INTRAVENOUS | Status: AC
  Administered 2024-02-02: 1000 mg via INTRAVENOUS
  Filled 2024-02-02: qty 50

## 2024-02-02 MED ORDER — CEFAZOLIN SODIUM-DEXTROSE 2-4 GM/100ML-% IV SOLN
INTRAVENOUS | Status: AC
Start: 2024-02-02 — End: ?
  Filled 2024-02-02: qty 100

## 2024-02-02 MED ORDER — HEPARIN SODIUM (PORCINE) 1000 UNIT/ML IJ SOLN
INTRAMUSCULAR | Status: DC | PRN
  Administered 2024-02-02: 3000 [IU] via INTRAVENOUS

## 2024-02-02 MED ORDER — PHENYLEPHRINE HCL-NACL 20-0.9 MG/250ML-% IV SOLN
INTRAVENOUS | Status: DC | PRN
  Administered 2024-02-02: 10 ug/min via INTRAVENOUS

## 2024-02-02 MED ORDER — LIDOCAINE 2% (20 MG/ML) 5 ML SYRINGE
INTRAMUSCULAR | Status: DC | PRN
  Administered 2024-02-02: 100 mg via INTRAVENOUS

## 2024-02-02 MED ORDER — SODIUM CHLORIDE 0.9 % IV SOLN: INTRAVENOUS | Status: AC

## 2024-02-02 MED ORDER — PROPOFOL 1000 MG/100ML IV EMUL
5.0000 ug/kg/min | INTRAVENOUS | Status: DC
  Administered 2024-02-02: 60 ug/kg/min via INTRAVENOUS
  Administered 2024-02-03: 65 ug/kg/min via INTRAVENOUS
  Administered 2024-02-03: 50 ug/kg/min via INTRAVENOUS
  Administered 2024-02-03: 45 ug/kg/min via INTRAVENOUS
  Administered 2024-02-03: 60 ug/kg/min via INTRAVENOUS
  Filled 2024-02-02 (×3): qty 100

## 2024-02-02 MED ORDER — CEFAZOLIN SODIUM-DEXTROSE 2-4 GM/100ML-% IV SOLN
2.0000 g | Freq: Once | INTRAVENOUS | Status: AC
  Administered 2024-02-02 (×2): 2 g via INTRAVENOUS

## 2024-02-02 MED ORDER — DEXAMETHASONE SODIUM PHOSPHATE 10 MG/ML IJ SOLN
INTRAMUSCULAR | Status: DC | PRN
  Administered 2024-02-02: 10 mg via INTRAVENOUS

## 2024-02-02 MED ORDER — NITROGLYCERIN 1 MG/10 ML FOR IR/CATH LAB
INTRA_ARTERIAL | Status: AC
  Filled 2024-02-02: qty 10

## 2024-02-02 MED ORDER — SODIUM CHLORIDE 0.9 % IV SOLN: INTRAVENOUS | Status: DC

## 2024-02-02 MED ORDER — FENTANYL CITRATE (PF) 250 MCG/5ML IJ SOLN
INTRAMUSCULAR | Status: DC | PRN
  Administered 2024-02-02 (×2): 50 ug via INTRAVENOUS

## 2024-02-02 MED ORDER — CLEVIDIPINE BUTYRATE 0.5 MG/ML IV EMUL: 0.0000 mg/h | INTRAVENOUS | Status: AC

## 2024-02-02 MED ORDER — ACETAMINOPHEN 650 MG RE SUPP: 650.0000 mg | RECTAL | Status: DC | PRN

## 2024-02-02 NOTE — Anesthesia Preprocedure Evaluation (Addendum)
 Anesthesia Evaluation  Patient identified by MRN, date of birth, ID band Patient awake    Reviewed: Allergy & Precautions, NPO status , Patient's Chart, lab work & pertinent test results  History of Anesthesia Complications Negative for: history of anesthetic complications  Airway Mallampati: II  TM Distance: >3 FB Neck ROM: Full    Dental no notable dental hx.    Pulmonary neg pulmonary ROS   Pulmonary exam normal        Cardiovascular negative cardio ROS Normal cardiovascular exam  TTE 01/24/24: EF 45-50%, apical hypokinesis, valves ok    Neuro/Psych  Headaches Acute cerebral venous thrombosis - likely 2/2 meningoencephalitis. MRI with extensive dural venous sinus thrombosis throughout dural vinus sinuses, small associated venous infarction      GI/Hepatic negative GI ROS, Neg liver ROS,,,  Endo/Other  negative endocrine ROS    Renal/GU negative Renal ROS     Musculoskeletal   Abdominal   Peds  Hematology negative hematology ROS (+)   Anesthesia Other Findings Day of surgery medications reviewed with patient.  Reproductive/Obstetrics                              Anesthesia Physical Anesthesia Plan  ASA: 3  Anesthesia Plan: General   Post-op Pain Management: Minimal or no pain anticipated   Induction: Intravenous  PONV Risk Score and Plan: 2 and Ondansetron, Dexamethasone and Treatment may vary due to age or medical condition  Airway Management Planned: Oral ETT  Additional Equipment: Arterial line  Intra-op Plan:   Post-operative Plan: Extubation in OR  Informed Consent: I have reviewed the patients History and Physical, chart, labs and discussed the procedure including the risks, benefits and alternatives for the proposed anesthesia with the patient or authorized representative who has indicated his/her understanding and acceptance.     Dental advisory given and  Consent reviewed with POA  Plan Discussed with: CRNA  Anesthesia Plan Comments: (Consent obtained from patient's brother. Patient awake and alert but deaf and unable to participate in consent process. All questions answered of family members. Stephannie Peters, MD)         Anesthesia Quick Evaluation

## 2024-02-02 NOTE — Progress Notes (Signed)
 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  He was found to have numerous venous thrombi without hypodensities.  He is admitted for neuromonitoring in the ICU.  Results show bradycardia but otherwise normal vitals.  Labs reviewed with evidence of leukocytosis.  Minimal hyperglycemia.  Diffuse dural sinus venous thrombosis  eICU Interventions  Maintain doxycycline for recent meningoencephalitis  Maintain heparin infusion, monitor for neurochanges in the ICU  Interval CT scan per neuro, will need MRI  DVT prophylaxis with therapeutic heparin GI prophylaxis not currently indicated     Intervention Category Evaluation Type: New Patient Evaluation  Samuel Warren 02/02/2024, 12:16 AM

## 2024-02-02 NOTE — Procedures (Signed)
 INR.  Status post left common carotid arteriogram.    Status post transverse sinus sigmoid sinus venograms.  Findings.  1.  Occluded posterior  half of  the superior sagittal sinus. 2.  Occluded dominant left transverse sinus, left sigmoid sinus and proximal internal jugular vein.  Revascularization of the internal jugular vein, left sigmoid sinus and the left transverse sinus with multiple contact aspirations, and x 1 stent retriever with near complete revascularization.  Superior sagittal sinus remains occluded.  Post CT brain no intracranial hemorrhage.  Contrast staining of residual clot noted in the dominant left transverse sinus and sigmoid sinus.  Left common femoral arterial puncture, and the right common femoral venous puncture sheaths left in situ.  Distal pulses all present unchanged.  Patient left intubated.  Fatima Sanger MD.

## 2024-02-02 NOTE — Sedation Documentation (Signed)
 Dr Corliss Skains in procedure.

## 2024-02-02 NOTE — Progress Notes (Signed)
 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 Pt is unable to provide history 2/2 R UE deficits at this time and hearing impairment requiring him to utilize ASL but unfortunately at this time unable to. Pt appears comfortable but is much weaker on R upper and lower rather than L.   Per brother pt was discharged 2-3 days ago on doxy after being found to have meningitis. His mother called him and stated he was not doing well and needed to go to hospital. She described he was unable to move or respond (per the brother). She states he was last known normal at 1100.  He also reportedly complained of HA, N/V. Pt's family ultimately called EMS and code stroke initiated. CTH in ed revealed multiple venous thrombi without acute hypodensity. Neuro was called for admission and due to the lack of acute findings for cva but need for neuro checks q1hr ccm was asked for admission.     Pertinent  Medical History  Meningoencephalitis Hearing impairment   Significant Hospital Events: Including procedures, antibiotic start and stop dates in addition to other pertinent events   Readmitted to ICU 3/23  Interim History / Subjective:  NAEON. Moving all extremities. Difficulty in finding an interpreter for [place] sign language (per ID notes, call [phone]). Brother has attempted to help a bit; however, pt is deaf.  Objective   Blood pressure 109/77, pulse (!) 46, temperature 98.1 F (36.7 C), temperature source Oral, resp. rate 18, height 5\' 4"  (1.626 m), weight 68.9 kg, SpO2 97%.        Intake/Output Summary (Last 24 hours) at 02/02/2024 0846 Last data filed at 02/02/2024  0700 Gross per 24 hour  Intake 347.95 ml  Output --  Net 347.95 ml   Filed Weights   02/01/24 1901 02/01/24 2200 02/02/24 0500  Weight: 77 kg 68.9 kg 68.9 kg    Examination: General: awake, appears comfortable, follows basic commands by following my  demonstrations HENT: NCAT. MMM. EOMI. Cardiovascular: RRR, no M/R/G. Abdomen: BS x 4, S/NT/ND. Extremities: No deformities, no edema. Neuro: Moves all extremities after following my demonstrations. He is deaf so communicating is challenging. 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 - Neuro following, likely to have VIR procedure today. - Q1hr neuro checks per neuro. - Heparin gtt per neuro recs. - Continue doxy empirically for now, was supposed to stop 3/23 per neuro notes from 3/19. ID has been consulted per neuro, awaiting to see 3/24. - F/u on brain MRI. - Will need PT/OT.  Deaf and communicates via [place] sign language. - Call [phone] M-F [phone], [phone] for after hours pager.  Constipation. - Colace, Dulcolax.   Best Practice (right click and "Reselect all SmartList Selections" daily)  Diet/type: NPO DVT prophylaxis systemic heparin Pressure ulcer(s): N/A GI prophylaxis: N/A Lines: N/A Foley:  N/A Code Status:  full code Last date of multidisciplinary goals of care discussion [per brother at bedside. ]   Critical care time: 30 min    Addilynn Mowrer Celine Mans, PA - C  [facility] For pager details, please see AMION or use Epic chat  After 1900, please call [facility] for cross coverage needs 02/02/2024, 9:00 AM

## 2024-02-02 NOTE — Progress Notes (Signed)
 Met with patient at bedside with sign language and [place] interpreters.  Others present are patient's mother, sister, and 2 brothers as well as his wife via FaceTime.   Alwyn Ren with IR came to bedside to explain the procedure - risks and benefits - with entire group. We will give the family time to make a decision about procedure and will let IR know when decision is made. Howard Patton C

## 2024-02-02 NOTE — Anesthesia Postprocedure Evaluation (Signed)
 Anesthesia Post Note  Patient: Samuel Warren  Procedure(s) Performed: RADIOLOGY WITH ANESTHESIA     Patient location during evaluation: ICU Anesthesia Type: General Level of consciousness: sedated Pain management: pain level controlled Vital Signs Assessment: post-procedure vital signs reviewed and stable Respiratory status: patient remains intubated per anesthesia plan Cardiovascular status: stable Postop Assessment: no apparent nausea or vomiting Anesthetic complications: no   No notable events documented.  Last Vitals:  Vitals:   02/02/24 2103 02/02/24 2130  BP:  (!) 89/60  Pulse: (!) 56 (!) 57  Resp: 18 18  Temp: (!) 35.5 C (!) 35.5 C  SpO2: 100% 100%    Last Pain:  Vitals:   02/02/24 1200  TempSrc: Oral  PainSc:                  Samuel Warren Samuel Warren

## 2024-02-02 NOTE — Progress Notes (Signed)
 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 He was found to have numerous venous thrombi without hypodensities. He is admitted for neuromonitoring in the ICU.   Patient returning from the IR today status post transverse sinus venograms with revascularization of the IJ, and distal sinuses.  No CT evidence of hemorrhage  Return from IR with no sedation ordered although he is on the ventilator and actively running sedation  eICU Interventions  Initiate propofol drip, fentanyl pushes  Synchronous on the ventilator     Intervention Category Intermediate Interventions: Medication change / dose adjustment  Pacey Willadsen 02/02/2024, 8:12 PM

## 2024-02-02 NOTE — Progress Notes (Signed)
 PHARMACY - ANTICOAGULATION Pharmacy Consult for heparin Indication: cerebral venous sinus thrombosis  Brief A/P: Heparin level subtherapeutic Increase Heparin rate  No Known Allergies  Patient Measurements: Height: 5\' 4"  (162.6 cm) Weight: 77 kg (169 lb 12.1 oz) IBW/kg (Calculated) : 59.2 Heparin Dosing Weight: 74.9  Vital Signs: Temp: 98.6 F (37 C) (03/24 0000) Temp Source: Axillary (03/24 0000) BP: 126/68 (03/23 2200) Pulse Rate: 46 (03/23 2200)  Labs: Recent Labs    02/01/24 1811 02/01/24 1816 02/02/24 0233  HGB 16.1 16.0 14.9  HCT 50.3 47.0 45.2  PLT 260  --  260  APTT 23*  --   --   LABPROT 13.3  --   --   INR 1.0  --   --   HEPARINUNFRC  --   --  0.21*  CREATININE 0.98 1.00  --     Estimated Creatinine Clearance: 97.6 mL/min (by C-G formula based on SCr of 1 mg/dL).  Assessment: 33 y.o. male with dural sinus thrombosis for heparin  Goal of Therapy:  Heparin level 0.3-0.5 units/ml Monitor platelets by anticoagulation protocol: Yes   Plan:  Increase Heparin 1100 units/hr Check heparin level in 8 hours.  Geannie Risen, PharmD, BCPS   02/02/2024,3:16 AM

## 2024-02-02 NOTE — Telephone Encounter (Signed)
 Patient Product/process development scientist completed.    The patient is insured through [facility]. Patient has [employer], may use a copay card, and/or apply for patient assistance if available.    Ran test claim for Eliquis 5 mg and the current 30 day co-pay is $50.00.  Ran test claim for Xarelto 20 mg and the current 30 day co-pay is $50.00.   This test claim was processed through [facility]- copay amounts may vary at other pharmacies due to pharmacy/plan contracts, or as the patient moves through the different stages of their insurance plan.     Roland Earl, CPHT Pharmacy Technician III Certified Patient Advocate [facility] Pharmacy Patient Advocate Team Direct Number: [phone]  Fax: [phone]

## 2024-02-02 NOTE — Anesthesia Procedure Notes (Signed)
 Arterial Line Insertion Start/End3/24/2025 3:10 PM, 02/02/2024 3:14 PM Performed by: Kaylyn Layer, MD, anesthesiologist  Patient location: Pre-op. Preanesthetic checklist: patient identified, IV checked, risks and benefits discussed, surgical consent, monitors and equipment checked, pre-op evaluation, timeout performed and anesthesia consent Patient sedated Left, radial was placed Catheter size: 20 G Hand hygiene performed  and maximum sterile barriers used   Attempts: 2 Procedure performed using ultrasound guided technique. Ultrasound Notes:anatomy identified, needle tip was noted to be adjacent to the nerve/plexus identified and no ultrasound evidence of intravascular and/or intraneural injection Following insertion, dressing applied and Biopatch. Post procedure assessment: normal and unchanged  Patient tolerated the procedure well with no immediate complications. Additional procedure comments: Ultrasound image not saved.

## 2024-02-02 NOTE — Progress Notes (Signed)
 PHARMACY - ANTICOAGULATION CONSULT NOTE  Pharmacy Consult for heparin Indication:  DVST   No Known Allergies  Patient Measurements: Height: 5\' 4"  (162.6 cm) Weight: 68.9 kg (151 lb 14.4 oz) IBW/kg (Calculated) : 59.2 Heparin Dosing Weight: 74.9  Vital Signs: Temp: 98.1 F (36.7 C) (03/24 1200) Temp Source: Oral (03/24 1200) BP: 113/70 (03/24 1400) Pulse Rate: 52 (03/24 1400)  Labs: Recent Labs    02/01/24 1811 02/01/24 1816 02/02/24 0233 02/02/24 0658  HGB 16.1 16.0 14.9  --   HCT 50.3 47.0 45.2  --   PLT 260  --  260  --   APTT 23*  --   --   --   LABPROT 13.3  --   --   --   INR 1.0  --   --   --   HEPARINUNFRC  --   --  0.21* 0.39  CREATININE 0.98 1.00  --   --     Estimated Creatinine Clearance: 87.2 mL/min (by C-G formula based on SCr of 1 mg/dL).   Medical History: Past Medical History:  Diagnosis Date   Deaf     Assessment: Patient presented as stroke alert with CC of dizziness, nausea and headache. CTA showing dural sinus thrombosis. Not on anticoagulation prior to admission. Pharmacy consulted to dose heparin gtt.   HL 0.66, supratherapeutic for lower goal with heparin infusion at 1100 units/h. Hgb 14.9, Plt 260, wnl. No signs/symptoms of bleeding noted. Heparin infusion was turned off for IR procedure after the level was drawn.     Goal of Therapy:  Heparin level 0.3-0.5 units/ml Monitor platelets by anticoagulation protocol: Yes   Plan:  Start heparin infusion at 1000 units/hr when return from IR Check anti-Xa level in 6 hours and daily while on heparin Continue to monitor CBC and for signs/symptoms of bleeding Follow up transition to DOAC   Stephenie Acres, PharmD PGY1 Pharmacy Resident 02/02/2024 2:36 PM

## 2024-02-02 NOTE — Sedation Documentation (Signed)
 Notified 4 [place] charge nurse patient will be kept intubated and left and right sheaths in place.

## 2024-02-02 NOTE — Progress Notes (Signed)
 Pt is deaf. Needs [place] sign language. Understands a little bit of ESL.

## 2024-02-02 NOTE — Sedation Documentation (Signed)
Patient is under the care of anesthesia  

## 2024-02-02 NOTE — Progress Notes (Addendum)
 STROKE TEAM PROGRESS NOTE   INTERIM HISTORY/SUBJECTIVE recent admission to the hospital from 3/16-3/20 for acute meningoencephalitis which was treated with antibiotics p/w dizzy, with headache, nausea and also some vomiting  CT head reveals multiple thrombosed cerebral veins including the SSS, straight sinus, inferior sagittal sinus and both transverse sinuses left worse than right. No acute hypodensity is seen  CTA of head and neck with CTP: Diffuse dural sinus thrombosis  CTV: Extensive dural sinus thrombus within the posterior aspect of the superior sagittal sinus, the straight sinus, the inferior sagittal sinus and left greater than right transverse sinus. Thrombus extends into the left sigmoid sinus and visualized left internal jugular vein. Innumerable enlarged and tortuous cortical veins are visualized, consistent with collateral venous drainage. Enlarged left petrosal veins are present in the middle cranial fossa.   This morning he is awake and alert in NAD, mimicking, no focal neurological deficits. C/o headache.   Dr Corliss Skains was consulted for possible diagnostic cerebral angiogram   Will start NS@125cc /hr.   OBJECTIVE  CBC    Component Value Date/Time   WBC 15.2 (H) 02/02/2024 0233   RBC 5.53 02/02/2024 0233   HGB 14.9 02/02/2024 0233   HCT 45.2 02/02/2024 0233   PLT 260 02/02/2024 0233   MCV 81.7 02/02/2024 0233   MCH 26.9 02/02/2024 0233   MCHC 33.0 02/02/2024 0233   RDW 13.1 02/02/2024 0233   LYMPHSABS 1.6 02/01/2024 1811   MONOABS 0.9 02/01/2024 1811   EOSABS 0.2 02/01/2024 1811   BASOSABS 0.0 02/01/2024 1811    BMET    Component Value Date/Time   NA 141 02/01/2024 1816   K 3.8 02/01/2024 1816   CL 110 02/01/2024 1816   CO2 21 (L) 02/01/2024 1811   GLUCOSE 133 (H) 02/01/2024 1816   BUN 25 (H) 02/01/2024 1816   CREATININE 1.00 02/01/2024 1816   CALCIUM 9.6 02/01/2024 1811   GFRNONAA >60 02/01/2024 1811    IMAGING past 24 hours CT HEAD POST STROKE  FOLLOWUP/TIMED/STAT READ Result Date: 02/02/2024 CLINICAL DATA:  Initial evaluation for acute neuro deficit, stroke suspected. EXAM: CT HEAD WITHOUT CONTRAST TECHNIQUE: Contiguous axial images were obtained from the base of the skull through the vertex without intravenous contrast. RADIATION DOSE REDUCTION: This exam was performed according to the departmental dose-optimization program which includes automated exposure control, adjustment of the mA and/or kV according to patient size and/or use of iterative reconstruction technique. COMPARISON:  Comparison made with prior studies from 02/01/2024 FINDINGS: Brain: Diffuse hyperdensity throughout the major dural sinuses, compatible with previously identified dural venous sinus thrombosis. No acute intracranial hemorrhage. Previously identified scattered small volume venous infarctions not well visualized by CT. Diffuse loss of cortical sulcation, compatible with associated cerebral edema. Approximate 8 mm of cerebellar tonsillar ectopia, relatively stable. Similar basilar cistern crowding. No acute intracranial hemorrhage. No other acute cortically based infarct. No mass lesion or midline shift. No extra-axial fluid collection. Vascular: Diffuse hyperdensity throughout the major dural sinuses, compatible with venous sinus thrombosis. No abnormal hyperdense arterial vessel. Skull: Scalp soft tissues and calvarium demonstrate no new finding. Sinuses/Orbits: Globes orbital soft tissues within normal limits. Paranasal sinuses remain largely clear. No significant mastoid effusion. Other: None. IMPRESSION: 1. Diffuse hyperdensity throughout the major dural sinuses, compatible with previously identified dural venous sinus thrombosis. 2. Associated cerebral edema with diffuse loss of cortical sulcation and 7 mm of cerebellar tonsillar ectopia, stable. 3. Previously identified scattered small volume venous infarctions not well visualized by CT. 4. No other new acute  intracranial abnormality. No acute intracranial hemorrhage. Electronically Signed   By: Rise Mu M.D.   On: 02/02/2024 01:22   MR BRAIN WO CONTRAST Result Date: 02/01/2024 CLINICAL DATA:  Initial evaluation for acute neuro deficit, stroke suspected. EXAM: MRI HEAD WITHOUT CONTRAST TECHNIQUE: Multiplanar, multiecho pulse sequences of the brain and surrounding structures were obtained without intravenous contrast. COMPARISON:  Comparison made with prior MRI from 01/26/2024 as well as CTs from earlier the same day. 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 There is associated diffuse venous engorgement with prominence of the leptomeningeal vessels throughout the brain. Few scattered foci of restricted diffusion seen involving the parasagittal frontal lobes and deep white matter of the right cerebral hemisphere (series 10, images 69, 64, 63), as well as the subcortical left occipital lobe (series 10, image 55). Findings consistent with small associated venous infarctions, a few which are new as compared to previous exam. No visible associated hemorrhage or significant mass effect. Associated mild diffuse cerebral edema with sulcal effacement noted, similar to prior. Basilar cisterns remain patent. No other acute arterial infarct elsewhere within the brain. Underlying cerebral volume within normal limits. No visible partially empty sella noted. Vascular: Findings consistent with widespread dural venous sinus thrombosis. Major intracranial arterial flow voids are maintained. Skull and upper cervical spine: Cerebellar tonsils are low lying extending up to 8 mm below the foramen magnum, similar to prior. Bone marrow signal intensity within normal limits. No scalp soft tissue abnormality. Sinuses/Orbits: Globes and orbital soft tissues within normal limits. Mild mucosal thickening noted about the  ethmoidal air cells and maxillary sinuses. Trace bilateral mastoid effusions noted. Image nasopharynx unremarkable. Other: None. IMPRESSION: 1. 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 2. Few scattered foci of restricted diffusion involving the parasagittal frontal lobes and deep white matter of the right cerebral hemisphere, as well as the subcortical left occipital lobe, consistent with small associated venous infarctions. A few of these are new as compared to previous MRI from 01/26/2024. No associated hemorrhage or significant mass effect. 3. Low lying cerebellar tonsils extending up to 8 mm below the foramen magnum, suggesting elevated intracranial pressure, similar to prior. Electronically Signed   By: Rise Mu M.D.   On: 02/01/2024 21:06   CT VENOGRAM HEAD Result Date: 02/01/2024 CLINICAL DATA:  Right-sided weakness.  Abnormal CT head. EXAM: CT VENOGRAM HEAD TECHNIQUE: Venographic phase images of the brain were obtained following the administration of intravenous contrast. Multiplanar reformats and maximum intensity projections were generated. RADIATION DOSE REDUCTION: This exam was performed according to the departmental dose-optimization program which includes automated exposure control, adjustment of the mA and/or kV according to patient size and/or use of iterative reconstruction technique. CONTRAST:  OMNIPAQUE IOHEXOL 350 MG/ML SOLN COMPARISON:  CT angio head and neck 01/04/2024 and CT head without contrast 01/04/2024. FINDINGS: Extensive dural sinus filling defects are compatible with thrombus within the posterior aspect of the superior sagittal sinus, the straight sinus the inferior sagittal sinus and left greater than right transverse sinus. Thrombus extends into the sigmoid sinus and visualized left internal jugular vein. Innumerable enlarged and tortuous cortical veins are visualized. Several veins  drain into the cavernous sinus bilaterally. Asymmetric veins are present in the left middle cranial fossa draining into the left petrosal sinuses. IMPRESSION: 1. Extensive dural sinus thrombus within the posterior aspect of the superior sagittal sinus, the straight  sinus the inferior sagittal sinus and left greater than right transverse sinus. Thrombus extends into the left sigmoid sinus and visualized left internal jugular vein. 2. Innumerable enlarged and tortuous cortical veins are visualized, consistent with collateral venous drainage. Enlarged left petrosal veins are present in the middle cranial fossa. These results were called by telephone at the time of interpretation on 02/01/2024 at 6:32 Pm to provider ERIC [facility] , who verbally acknowledged these results. Electronically Signed   By: Marin Roberts M.D.   On: 02/01/2024 19:20   CT ANGIO HEAD NECK W WO CM W PERF (CODE STROKE) Result Date: 02/01/2024 CLINICAL DATA: Right-sided weakness. Abnormal CT. Diffuse dural sinus thrombosis suspected. EXAM: CT ANGIOGRAPHY HEAD AND NECK CT PERFUSION BRAIN TECHNIQUE: Multidetector CT imaging of the head and neck was performed using the standard protocol during bolus administration of intravenous contrast. Multiplanar CT image reconstructions and MIPs were obtained to evaluate the vascular anatomy. Carotid stenosis measurements (when applicable) are obtained utilizing NASCET criteria, using the distal internal carotid diameter as the denominator. Multiphase CT imaging of the brain was performed following IV bolus contrast injection. Subsequent parametric perfusion maps were calculated using RAPID software. RADIATION DOSE REDUCTION: This exam was performed according to the departmental dose-optimization program which includes automated exposure control, adjustment of the mA and/or kV according to patient size and/or use of iterative reconstruction technique. CONTRAST:  OMNIPAQUE IOHEXOL 350 MG/ML SOLN  COMPARISON:  CT head without contrast 02/01/2024. MR head without and with contrast 01/26/2024. FINDINGS: CTA NECK FINDINGS Aortic arch: A 3 vessel arch configuration is present. No significant atherosclerotic change or focal stenosis is present. The great vessel origins are widely patent. Right carotid system: The right common carotid artery is within normal limits. Bifurcation is unremarkable. Cervical right ICA is normal. Left carotid system: The left common carotid artery is within normal limits. The bifurcation is unremarkable. The cervical left ICA is normal. Vertebral arteries: The left vertebral artery is dominant. Both vertebral arteries originate from the subclavian arteries without significant stenosis. No significant stenosis is present in either vertebral artery in the neck. Skeleton: Vertebral body heights and alignment are normal. Straightening of the normal cervical lordosis is present. No focal osseous lesions are present. Other neck: The soft tissues of the neck are otherwise unremarkable. Salivary glands are within normal limits. Thyroid is normal. No significant adenopathy is present. No focal mucosal or submucosal lesions are present. Upper chest: The lung apices are clear. The thoracic inlet is within normal limits. Review of the MIP images confirms the above findings CTA HEAD FINDINGS Anterior circulation: The internal carotid arteries are within normal limits from the skull base to the ICA termini. The A1 and M1 segments are normal. The anterior communicating artery is patent. The ACA and MCA branch vessels are normal bilaterally. No aneurysm is present. Posterior circulation: The left vertebral artery is the dominant vessel. PICA origins are visualized and normal. Vertebrobasilar junction is normal. The superior cerebellar arteries are patent. Both posterior cerebral arteries originate from basilar tip. The PCA branch vessels are normal bilaterally. Venous sinuses: Numerous cortical veins  are opacified over the convexities bilaterally. No significant filling of the posterosuperior sagittal sinus, straight sinus or the transverse sinuses are present. Anatomic variants: None Review of the MIP images confirms the above findings CT Brain Perfusion Findings: ASPECTS: 10/10 CBF (<30%) Volume: 0mL Perfusion (Tmax>6.0s) volume: . This is likely accurate measurement given the extensive dural sinus thrombosis. Mismatch Volume: IMPRESSION: 1. Diffuse dural sinus thrombosis.  2. Numerous cortical veins are opacified over the convexities bilaterally represent collateral venous flow to the patent anterior superior sagittal sinus. 3. Diffuse decreased perfusion over the convexities bilaterally. Electronically Signed   By: Marin Roberts M.D.   On: 02/01/2024 19:06   CT HEAD CODE STROKE WO CONTRAST Result Date: 02/01/2024 CLINICAL DATA:  Code stroke.  Right-sided weakness. EXAM: CT HEAD WITHOUT CONTRAST TECHNIQUE: Contiguous axial images were obtained from the base of the skull through the vertex without intravenous contrast. RADIATION DOSE REDUCTION: This exam was performed according to the departmental dose-optimization program which includes automated exposure control, adjustment of the mA and/or kV according to patient size and/or use of iterative reconstruction technique. COMPARISON:  MR head without and with contrast 01/26/2024. CT angio head and neck 01/25/2024. FINDINGS: Brain: No acute infarct or parenchymal hemorrhage is present. The ventricles are of normal size. Deep brain nuclei are within normal limits. No significant extraaxial fluid collection is present. The brainstem and cerebellum are within normal limits. Vascular: The straight sinus and inferior sagittal sinus are hyperdense. The posterosuperior sagittal sinus and left transverse sinus or hyperdense. Additional cortical veins are visualized on the left. High density extends into the left sigmoid sinus, suggesting diffuse dural  sinus thrombosis. Nor teary ule calcifications are present. Skull: Calvarium is intact. No focal lytic or blastic lesions are present. No significant extracranial soft tissue lesion is present. Sinuses/Orbits: The paranasal sinuses and mastoid air cells are clear. The globes and orbits are within normal limits. ASPECTS [facility] Stroke Program Early CT Score) - Ganglionic level infarction (caudate, lentiform nuclei, internal capsule, insula, M1-M3 cortex): 7/7 - Supraganglionic infarction (M4-M6 cortex): 3/3 Total score (0-10 with 10 being normal): 10/10 IMPRESSION: 1. Hyperdense straight sinus, inferior sagittal sinus, posterosuperior sagittal sinus, left transverse sinus, and left sigmoid sinus, suggesting diffuse dural sinus thrombosis. 2. No acute infarct or parenchymal hemorrhage. 3. Aspects is 10/10. Critical Value/emergent results were called by telephone at the time of interpretation on 02/01/2024 at 6:32 pm to provider JOSHUA LONG , who verbally acknowledged these results. Electronically Signed   By: Marin Roberts M.D.   On: 02/01/2024 18:38    Vitals:   02/02/24 0400 02/02/24 0500 02/02/24 0600 02/02/24 0700  BP: 101/67 101/64 (!) 89/58 109/77  Pulse: (!) 52 (!) 48 (!) 45 (!) 46  Resp: (!) 9 20 14 18   Temp: 98.5 F (36.9 C)     TempSrc: Oral     SpO2: 97% 98% 97% 97%  Weight:  68.9 kg    Height:         PHYSICAL EXAM General:  Alert, well-nourished, well-developed patient in no acute distress Psych:  Mood and affect appropriate for situation CV: Regular rate and rhythm on monitor Respiratory:  Regular, unlabored respirations on room air GI: Abdomen soft and nontender   NEURO:  Mental Status: AA able to mimic and follow commands  Speech/Language: deaf   Cranial Nerves:  II: PERRL. Visual fields full.  III, IV, VI: EOMI. Eyelids elevate symmetrically.  V: Sensation is intact to light touch and symmetrical to face.  VII: Face is symmetrical resting and smiling VIII:  hearing intact to voice. IX, X: Palate elevates symmetrically. Phonation is normal.  RU:[id number] shrug 5/5. XII: tongue is midline without fasciculations. Motor: Right arm and leg with drift left arm and leg 5/5 Tone: is normal and bulk is normal Sensation- Intact to light touch bilaterally. Extinction absent to light touch to DSS.   Coordination: FTN intact bilaterally, HKS:  no ataxia in BLE.No drift.  Gait- deferred   ASSESSMENT/PLAN  Mr. Timonthy Hovater is a 33 y.o. 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  NIH on Admission 4  CVST  in  SSS, straight sinus, inferior sagittal sinus and both transverse sinuses  Etiology:  due to delayed complication of recent viral meningoencephalitis  Code Stroke CT head multiple thrombosed cerebral veins including the SSS, straight sinus, inferior sagittal sinus and both transverse sinuses left worse than right. No acute hypodensity is seen  CTA head & neck  Diffuse dural sinus thrombosis  CTV Extensive dural sinus thrombus within the posterior aspect of the superior sagittal sinus, the straight sinus, the inferior sagittal sinus and left greater than right transverse sinus. Thrombus extends into the left sigmoid sinus and visualized left internal jugular vein. Innumerable enlarged and tortuous cortical veins are visualized, consistent with collateral venous drainage. Enlarged left petrosal veins are present in the middle cranial fossa Repeat CT head Diffuse hyperdensity throughout the major dural sinuses.  ssociated cerebral edema with diffuse loss of cortical sulcation and 7 mm of cerebellar tonsillar ectopia, stable. 2D Echo 3/18 EF 45 to 50%. 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 Small vessel disease in the setting of CNS infection  Completed course of Doxy  Hyperlipidemia Home meds: Atorvastatin 80,  resumed in hospital LDL 279, goal < 70 Continue statin at discharge  Dysphagia Patient has post-stroke dysphagia, SLP consulted    Diet   Diet NPO time specified   Advance diet as tolerated   Other Active Problems   Hospital day # 1   Gevena Mart DNP, ACNPC-AG  [facility]  I have personally obtained history,examined this patient, reviewed notes, independently viewed imaging studies, participated in medical decision making and plan of care.ROS completed by me personally and pertinent positives fully documented  I have made any additions or clarifications directly to the above note. Agree with note above.  33 year old  deaf and dumb [place] male who communicates only with sign language presented with several days of headache nausea vomiting and dizziness and CT scan, MRI and CT venogram show extensive dural sinus thrombosis involving posterior aspect of superior sagittal sinus, straight sinus and progressive lateral sinus and left greater than right transverse sinus.  The thrombus extends into the left jugular sinus the patient is at extremely high risk for neurological worsening due to cerebral edema and brain herniation.  Even though his neurological exam looks quite well compensated for now I believe anticoagulation alone may not be enough and he may get into trouble in the future.  I had a long discussion with Dr. Corliss Skains neurointerventionist on-call and recommend mechanical thrombectomy to decrease clot burden and improve eventual recanalization with anticoagulation.  A long discussion  with the patient's brother who is making healthcare decisions for him using [place]   language interpreter at the bedside and also spoke to the patient's other family members and pastor and discussed risk-benefit of conservative therapy with anticoagulation versus endovascular treatment  with mechanical thrombectomy to decrease clot burden.  Patient and family wish to proceed with endovascular mechanical thrombectomy.  Continue IV heparin and aggressive IV hydration will switch to Eliquis in a few days.  This patient is critically ill and at significant risk of neurological worsening, death and care requires constant monitoring of vital signs, hemodynamics,respiratory and cardiac monitoring, extensive review of multiple databases, frequent neurological assessment, discussion with family, other specialists and medical decision making of high complexity.I have made any additions or clarifications directly to the above note.This critical care time does not reflect procedure time, or teaching time or supervisory time of PA/NP/Med Resident etc but could involve care discussion time.  I spent 30 minutes of neurocritical care time  in the care of  this patient.     Delia Heady, MD Medical Director [facility] Stroke Center Pager: [phone] 02/02/2024 4:34 PM   To contact Stroke Continuity provider, please refer to [email or web address]. After hours, contact General Neurology

## 2024-02-02 NOTE — Anesthesia Procedure Notes (Signed)
 Procedure Name: Intubation Date/Time: 02/02/2024 3:06 PM  Performed by: Camillia Herter, CRNAPre-anesthesia Checklist: Patient identified, Emergency Drugs available, Suction available and Patient being monitored Patient Re-evaluated:Patient Re-evaluated prior to induction Oxygen Delivery Method: Circle System Utilized Preoxygenation: Pre-oxygenation with 100% oxygen Induction Type: IV induction Ventilation: Mask ventilation without difficulty Laryngoscope Size: Miller and 2 Grade View: Grade I Tube type: Oral Tube size: 7.5 mm Number of attempts: 1 Airway Equipment and Method: Stylet and Oral airway Placement Confirmation: ETT inserted through vocal cords under direct vision, positive ETCO2 and breath sounds checked- equal and bilateral Secured at: 22 cm Tube secured with: Tape Dental Injury: Teeth and Oropharynx as per pre-operative assessment

## 2024-02-02 NOTE — Progress Notes (Signed)
 Pt's wife, Leslie Dales, was on Facetime during meeting and, with entire family and [place] interpreter (Y Hin) present as witnesses, she consented to letting the patient's brother, Selmer Adduci, sign all consent forms relating to the patient on her behalf. Arneshia Ade C 11:29 AM

## 2024-02-02 NOTE — Sedation Documentation (Signed)
 Paged Dr Pearlean Brownie for Dr Corliss Skains. They spoke to each other plan of care for patient.

## 2024-02-02 NOTE — Sedation Documentation (Signed)
 ACT 245 notified Dr Corliss Skains

## 2024-02-02 NOTE — Consult Note (Signed)
 Chief Complaint: Patient was seen in consultation today for Code Stroke   Referring Physician(s): Dr. Pearlean Brownie   Supervising Physician: Julieanne Cotton  Patient Status: Samuel Warren  History of Present Illness: Samuel Warren is a 33 y.o. male with a past medical history of deafness who first presented to the ED 01/25/24 with a severe headache, neck pain and vomiting after returning from a hunting trip. He is [place] and speaks [place]. 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 An LP was negative. Imaging showed an incidental finding of acute infarcts in the right frontal white matter likely due to infectious vasculitis versus small vessel disease. He was started on 81 mg aspirin. He was discharged 01/29/24 with outpatient neurology follow up.    He returned to the ED 02/01/24 with acute onset of nausea, vomiting, dizziness and right-sided weakness. A sign-language interpreter assisted with communication but the patient does not really speak english/ASL. Imaging revealed multiple thrombosed cerebral veins.   CT venogram head 02/01/24 IMPRESSION: 1. 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 Thrombus extends into the left sigmoid sinus and visualized left internal jugular vein. 2. Innumerable enlarged and tortuous cortical veins are visualized, consistent with collateral venous drainage. Enlarged left petrosal veins are present in the middle cranial fossa.  An MRI brain showed low lying cerebellar tonsils suggesting elevated intracranial pressure. This was a similar finding compared to his imaging on 01/26/24. Neuro Interventional Radiology has been consulted for a diagnostic cerebral angiogram with possible intervention. Imaging reviewed and procedure approved by Dr. Corliss Skains.    Past Medical  History:  Diagnosis Date   Deaf     History reviewed. No pertinent surgical history.  Allergies: Patient has no known allergies.  Medications: Prior to Admission medications   Medication Sig Start Date End Date Taking? Authorizing Provider  aspirin EC 81 MG tablet Take 1 tablet (81 mg total) by mouth daily. Swallow whole. 01/29/24  Yes Glade Lloyd, MD  doxycycline (VIBRA-TABS) 100 MG tablet Take 1 tablet (100 mg total) by mouth 2 (two) times daily with a meal for 7 days. 01/29/24 02/05/24 Yes Glade Lloyd, MD  ondansetron (ZOFRAN) 4 MG tablet Take 1 tablet (4 mg total) by mouth every 6 (six) hours as needed for nausea. 01/29/24  Yes Glade Lloyd, MD     History reviewed. No pertinent family history.  Social History   Socioeconomic History   Marital status: Single    Spouse name: Not on file   Number of children: Not on file   Years of education: Not on file   Highest education level: Not on file  Occupational History   Not on file  Tobacco Use   Smoking status: Never   Smokeless tobacco: Never  Substance and Sexual Activity   Alcohol use: Not Currently   Drug use: Not Currently   Sexual activity: Not on file  Other Topics Concern   Not on file  Social History Narrative   ** Merged History Encounter **       Social Drivers of Health   Financial Resource Strain: Not on file  Food Insecurity: No Food Insecurity (01/25/2024)   Hunger Vital Sign    Worried About Running Out of Food in the Last Year: Never true    Ran Out of Food in the Last Year: Never true  Transportation Needs:  No Transportation Needs (01/25/2024)   PRAPARE - Administrator, Civil Service (Medical): No    Lack of Transportation (Non-Medical): No  Physical Activity: Not on file  Stress: Not on file  Social Connections: Not on file    Review of Systems: A 12 point ROS discussed and pertinent positives are indicated in the HPI above.  All other systems are negative.  Review of  Systems  Unable to perform ROS: Other  Language barrier. Per family the patient has a headache.   Vital Signs: BP 104/84   Pulse (!) 52   Temp 98.1 F (36.7 C) (Oral)   Resp (!) 5   Ht 5\' 4"  (1.626 m)   Wt 151 lb 14.4 oz (68.9 kg)   SpO2 97%   BMI 26.07 kg/m   Physical Exam Constitutional:      General: He is not in acute distress.    Appearance: He is not ill-appearing.  HENT:     Mouth/Throat:     Mouth: Mucous membranes are moist.     Pharynx: Oropharynx is clear.  Cardiovascular:     Rate and Rhythm: Regular rhythm. Bradycardia present.     Pulses: Normal pulses.  Pulmonary:     Effort: Pulmonary effort is normal.  Abdominal:     Tenderness: There is no abdominal tenderness.  Skin:    General: Skin is warm and dry.  Neurological:     Mental Status: He is alert.     Comments: Appears alert and oriented. Able to stay focused and present during the discussions. He was able to mimic movements by Dr. Pearlean Brownie - raising his arms, legs, hand grips, nose/finger touches, etc.      Imaging: CT HEAD POST STROKE FOLLOWUP/TIMED/STAT READ Result Date: 02/02/2024 CLINICAL DATA:  Initial evaluation for acute neuro deficit, stroke suspected. EXAM: CT HEAD WITHOUT CONTRAST TECHNIQUE: Contiguous axial images were obtained from the base of the skull through the vertex without intravenous contrast. RADIATION DOSE REDUCTION: This exam was performed according to the departmental dose-optimization program which includes automated exposure control, adjustment of the mA and/or kV according to patient size and/or use of iterative reconstruction technique. COMPARISON:  Comparison made with prior studies from 02/01/2024 FINDINGS: Brain: Diffuse hyperdensity throughout the major dural sinuses, compatible with previously identified dural venous sinus thrombosis. No acute intracranial hemorrhage. Previously identified scattered small volume venous infarctions not well visualized by CT. Diffuse loss of  cortical sulcation, compatible with associated cerebral edema. Approximate 8 mm of cerebellar tonsillar ectopia, relatively stable. Similar basilar cistern crowding. No acute intracranial hemorrhage. No other acute cortically based infarct. No mass lesion or midline shift. No extra-axial fluid collection. Vascular: Diffuse hyperdensity throughout the major dural sinuses, compatible with venous sinus thrombosis. No abnormal hyperdense arterial vessel. Skull: Scalp soft tissues and calvarium demonstrate no new finding. Sinuses/Orbits: Globes orbital soft tissues within normal limits. Paranasal sinuses remain largely clear. No significant mastoid effusion. Other: None. IMPRESSION: 1. Diffuse hyperdensity throughout the major dural sinuses, compatible with previously identified dural venous sinus thrombosis. 2. Associated cerebral edema with diffuse loss of cortical sulcation and 7 mm of cerebellar tonsillar ectopia, stable. 3. Previously identified scattered small volume venous infarctions not well visualized by CT. 4. No other new acute intracranial abnormality. No acute intracranial hemorrhage. Electronically Signed   By: Rise Mu M.D.   On: 02/02/2024 01:22   MR BRAIN WO CONTRAST Result Date: 02/01/2024 CLINICAL DATA:  Initial evaluation for acute neuro deficit, stroke suspected. EXAM: MRI  HEAD WITHOUT CONTRAST TECHNIQUE: Multiplanar, multiecho pulse sequences of the brain and surrounding structures were obtained without intravenous contrast. COMPARISON:  Comparison made with prior MRI from 01/26/2024 as well as CTs from earlier the same day. 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 There is associated diffuse venous engorgement with prominence of the leptomeningeal vessels throughout the brain. Few scattered foci of restricted diffusion seen involving the parasagittal frontal lobes and deep  white matter of the right cerebral hemisphere (series 10, images 69, 64, 63), as well as the subcortical left occipital lobe (series 10, image 55). Findings consistent with small associated venous infarctions, a few which are new as compared to previous exam. No visible associated hemorrhage or significant mass effect. Associated mild diffuse cerebral edema with sulcal effacement noted, similar to prior. Basilar cisterns remain patent. No other acute arterial infarct elsewhere within the brain. Underlying cerebral volume within normal limits. No visible partially empty sella noted. Vascular: Findings consistent with widespread dural venous sinus thrombosis. Major intracranial arterial flow voids are maintained. Skull and upper cervical spine: Cerebellar tonsils are low lying extending up to 8 mm below the foramen magnum, similar to prior. Bone marrow signal intensity within normal limits. No scalp soft tissue abnormality. Sinuses/Orbits: Globes and orbital soft tissues within normal limits. Mild mucosal thickening noted about the ethmoidal air cells and maxillary sinuses. Trace bilateral mastoid effusions noted. Image nasopharynx unremarkable. Other: None. IMPRESSION: 1. 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 2. Few scattered foci of restricted diffusion involving the parasagittal frontal lobes and deep white matter of the right cerebral hemisphere, as well as the subcortical left occipital lobe, consistent with small associated venous infarctions. A few of these are new as compared to previous MRI from 01/26/2024. No associated hemorrhage or significant mass effect. 3. Low lying cerebellar tonsils extending up to 8 mm below the foramen magnum, suggesting elevated intracranial pressure, similar to prior. Electronically Signed   By: Rise Mu M.D.   On: 02/01/2024 21:06   CT VENOGRAM HEAD Result Date: 02/01/2024 CLINICAL  DATA:  Right-sided weakness.  Abnormal CT head. EXAM: CT VENOGRAM HEAD TECHNIQUE: Venographic phase images of the brain were obtained following the administration of intravenous contrast. Multiplanar reformats and maximum intensity projections were generated. RADIATION DOSE REDUCTION: This exam was performed according to the departmental dose-optimization program which includes automated exposure control, adjustment of the mA and/or kV according to patient size and/or use of iterative reconstruction technique. CONTRAST:  OMNIPAQUE IOHEXOL 350 MG/ML SOLN COMPARISON:  CT angio head and neck 01/04/2024 and CT head without contrast 01/04/2024. FINDINGS: Extensive dural sinus filling defects are compatible with thrombus within the posterior aspect of the superior sagittal sinus, the straight sinus the inferior sagittal sinus and left greater than right transverse sinus. Thrombus extends into the sigmoid sinus and visualized left internal jugular vein. Innumerable enlarged and tortuous cortical veins are visualized. Several veins drain into the cavernous sinus bilaterally. Asymmetric veins are present in the left middle cranial fossa draining into the left petrosal sinuses. IMPRESSION: 1. 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 Thrombus extends into the left sigmoid sinus and visualized left internal jugular vein. 2. Innumerable enlarged and tortuous cortical veins are visualized, consistent with collateral venous drainage. Enlarged left petrosal  veins are present in the middle cranial fossa. These results were called by telephone at the time of interpretation on 02/01/2024 at 6:32 Pm to provider ER[facility] , who verbally acknowledged these results. Electronically Signed   By: Marin Roberts M.D.   On: 02/01/2024 19:20   CT ANGIO HEAD NECK W WO CM W PERF (CODE  STROKE) Result Date: 02/01/2024 CLINICAL DATA: Right-sided weakness. Abnormal CT. Diffuse dural sinus thrombosis suspected. EXAM: CT ANGIOGRAPHY HEAD AND NECK CT PERFUSION BRAIN TECHNIQUE: Multidetector CT imaging of the head and neck was performed using the standard protocol during bolus administration of intravenous contrast. Multiplanar CT image reconstructions and MIPs were obtained to evaluate the vascular anatomy. Carotid stenosis measurements (when applicable) are obtained utilizing NASCET criteria, using the distal internal carotid diameter as the denominator. Multiphase CT imaging of the brain was performed following IV bolus contrast injection. Subsequent parametric perfusion maps were calculated using RAPID software. RADIATION DOSE REDUCTION: This exam was performed according to the departmental dose-optimization program which includes automated exposure control, adjustment of the mA and/or kV according to patient size and/or use of iterative reconstruction technique. CONTRAST:  OMNIPAQUE IOHEXOL 350 MG/ML SOLN COMPARISON:  CT head without contrast 02/01/2024. MR head without and with contrast 01/26/2024. FINDINGS: CTA NECK FINDINGS Aortic arch: A 3 vessel arch configuration is present. No significant atherosclerotic change or focal stenosis is present. The great vessel origins are widely patent. Right carotid system: The right common carotid artery is within normal limits. Bifurcation is unremarkable. Cervical right ICA is normal. Left carotid system: The left common carotid artery is within normal limits. The bifurcation is unremarkable. The cervical left ICA is normal. Vertebral arteries: The left vertebral artery is dominant. Both vertebral arteries originate from the subclavian arteries without significant stenosis. No significant stenosis is present in either vertebral artery in the neck. Skeleton: Vertebral body heights and alignment are normal. Straightening of the normal cervical lordosis  is present. No focal osseous lesions are present. Other neck: The soft tissues of the neck are otherwise unremarkable. Salivary glands are within normal limits. Thyroid is normal. No significant adenopathy is present. No focal mucosal or submucosal lesions are present. Upper chest: The lung apices are clear. The thoracic inlet is within normal limits. Review of the MIP images confirms the above findings CTA HEAD FINDINGS Anterior circulation: The internal carotid arteries are within normal limits from the skull base to the ICA termini. The A1 and M1 segments are normal. The anterior communicating artery is patent. The ACA and MCA branch vessels are normal bilaterally. No aneurysm is present. Posterior circulation: The left vertebral artery is the dominant vessel. PICA origins are visualized and normal. Vertebrobasilar junction is normal. The superior cerebellar arteries are patent. Both posterior cerebral arteries originate from basilar tip. The PCA branch vessels are normal bilaterally. Venous sinuses: Numerous cortical veins are opacified over the convexities bilaterally. No significant filling of the posterosuperior sagittal sinus, straight sinus or the transverse sinuses are present. Anatomic variants: None Review of the MIP images confirms the above findings CT Brain Perfusion Findings: ASPECTS: 10/10 CBF (<30%) Volume: 0mL Perfusion (Tmax>6.0s) volume: . This is likely accurate measurement given the extensive dural sinus thrombosis. Mismatch Volume: IMPRESSION: 1. Diffuse dural sinus thrombosis. 2. Numerous cortical veins are opacified over the convexities bilaterally represent collateral venous flow to the patent anterior superior sagittal sinus. 3. Diffuse decreased perfusion over the convexities bilaterally. Electronically Signed   By: Marin Roberts M.D.   On: 02/01/2024  19:06   CT HEAD CODE STROKE WO CONTRAST Result Date: 02/01/2024 CLINICAL DATA:  Code stroke.  Right-sided weakness.  EXAM: CT HEAD WITHOUT CONTRAST TECHNIQUE: Contiguous axial images were obtained from the base of the skull through the vertex without intravenous contrast. RADIATION DOSE REDUCTION: This exam was performed according to the departmental dose-optimization program which includes automated exposure control, adjustment of the mA and/or kV according to patient size and/or use of iterative reconstruction technique. COMPARISON:  MR head without and with contrast 01/26/2024. CT angio head and neck 01/25/2024. FINDINGS: Brain: No acute infarct or parenchymal hemorrhage is present. The ventricles are of normal size. Deep brain nuclei are within normal limits. No significant extraaxial fluid collection is present. The brainstem and cerebellum are within normal limits. Vascular: The straight sinus and inferior sagittal sinus are hyperdense. The posterosuperior sagittal sinus and left transverse sinus or hyperdense. Additional cortical veins are visualized on the left. High density extends into the left sigmoid sinus, suggesting diffuse dural sinus thrombosis. Nor teary ule calcifications are present. Skull: Calvarium is intact. No focal lytic or blastic lesions are present. No significant extracranial soft tissue lesion is present. Sinuses/Orbits: The paranasal sinuses and mastoid air cells are clear. The globes and orbits are within normal limits. ASPECTS [facility] Stroke Program Early CT Score) - Ganglionic level infarction (caudate, lentiform nuclei, internal capsule, insula, M1-M3 cortex): 7/7 - Supraganglionic infarction (M4-M6 cortex): 3/3 Total score (0-10 with 10 being normal): 10/10 IMPRESSION: 1. Hyperdense straight sinus, inferior sagittal sinus, posterosuperior sagittal sinus, left transverse sinus, and left sigmoid sinus, suggesting diffuse dural sinus thrombosis. 2. No acute infarct or parenchymal hemorrhage. 3. Aspects is 10/10. Critical Value/emergent results were called by telephone at the time of interpretation  on 02/01/2024 at 6:32 pm to provider JOSHUA LONG , who verbally acknowledged these results. Electronically Signed   By: Marin Roberts M.D.   On: 02/01/2024 18:38   ECHOCARDIOGRAM COMPLETE Result Date: 01/27/2024    ECHOCARDIOGRAM REPORT   Patient Name:   Samuel Warren Date of Exam: 01/27/2024 Medical Rec #:  [id number]    Height:       64.0 in Accession #:    [id number]   Weight:       159.2 lb Date of Birth:  02/06/1991    BSA:          1.775 m Patient Age:    32 years     BP:           94/74 mmHg Patient Gender: M            HR:           71 bpm. Exam Location:  Inpatient Procedure: 2D Echo, Cardiac Doppler and Color Doppler (Both Spectral and Color            Flow Doppler were utilized during procedure). Indications:    Stroke I63.9  History:        Patient has no prior history of Echocardiogram examinations.  Sonographer:    Harriette Bouillon RDCS Referring Phys: [id number] ASHISH ARORA IMPRESSIONS  1. Apical hypokinesis with overall mild LV dysfunction.  2. Left ventricular ejection fraction, by estimation, is 45 to 50%. The left ventricle has mildly decreased function. The left ventricle demonstrates regional wall motion abnormalities (see scoring diagram/findings for description). Left ventricular diastolic parameters were normal.  3. Right ventricular systolic function is normal. The right ventricular size is normal.  4. The mitral valve is normal in structure. Trivial mitral valve regurgitation. No  evidence of mitral stenosis.  5. The aortic valve is tricuspid. Aortic valve regurgitation is not visualized. No aortic stenosis is present. Comparison(s): No prior Echocardiogram. FINDINGS  Left Ventricle: Left ventricular ejection fraction, by estimation, is 45 to 50%. The left ventricle has mildly decreased function. The left ventricle demonstrates regional wall motion abnormalities. The left ventricular internal cavity size was normal in size. There is no left ventricular hypertrophy. Left ventricular  diastolic parameters were normal. Right Ventricle: The right ventricular size is normal. Right ventricular systolic function is normal. Left Atrium: Left atrial size was normal in size. Right Atrium: Right atrial size was normal in size. Pericardium: There is no evidence of pericardial effusion. Mitral Valve: The mitral valve is normal in structure. Trivial mitral valve regurgitation. No evidence of mitral valve stenosis. Tricuspid Valve: The tricuspid valve is normal in structure. Tricuspid valve regurgitation is trivial. No evidence of tricuspid stenosis. Aortic Valve: The aortic valve is tricuspid. Aortic valve regurgitation is not visualized. No aortic stenosis is present. Pulmonic Valve: The pulmonic valve was normal in structure. Pulmonic valve regurgitation is not visualized. No evidence of pulmonic stenosis. Aorta: The aortic root is normal in size and structure. Venous: The inferior vena cava was not well visualized. IAS/Shunts: The interatrial septum was not well visualized. Additional Comments: Apical hypokinesis with overall mild LV dysfunction.  LEFT VENTRICLE PLAX 2D LVIDd:         5.10 cm   Diastology LVIDs:         3.00 cm   LV e' medial:    9.68 cm/s LV PW:         0.90 cm   LV E/e' medial:  8.3 LV IVS:        0.90 cm   LV e' lateral:   13.30 cm/s LVOT diam:     2.20 cm   LV E/e' lateral: 6.1 LV SV:         84 LV SV Index:   48 LVOT Area:     3.80 cm  RIGHT VENTRICLE RV S prime:     14.10 cm/s TAPSE (M-mode): 2.7 cm LEFT ATRIUM             Index        RIGHT ATRIUM           Index LA diam:        2.60 cm 1.46 cm/m   RA Area:     11.20 cm LA Vol (A2C):   44.3 ml 24.95 ml/m  RA Volume:   21.30 ml  12.00 ml/m LA Vol (A4C):   33.8 ml 19.04 ml/m LA Biplane Vol: 38.5 ml 21.68 ml/m  AORTIC VALVE LVOT Vmax:   105.00 cm/s LVOT Vmean:  67.000 cm/s LVOT VTI:    0.222 m  AORTA Ao Root diam: 3.40 cm Ao Asc diam:  3.40 cm MITRAL VALVE MV Area (PHT): 3.83 cm    SHUNTS MV Decel Time: 198 msec    Systemic  VTI:  0.22 m MV E velocity: 80.70 cm/s  Systemic Diam: 2.20 cm MV A velocity: 86.50 cm/s MV E/A ratio:  0.93 Olga Millers MD Electronically signed by Olga Millers MD Signature Date/Time: 01/27/2024/11:58:50 AM    Final    MR ANGIO HEAD WO W CONTRAST Addendum Date: 01/26/2024 ADDENDUM REPORT: 01/26/2024 16:11 ADDENDUM: These results will be called to the ordering clinician or representative by the Radiologist Assistant, and communication documented in the PACS or [employer]. Electronically Signed   By: Freida Busman  Mosetta Putt M.D.   On: 01/26/2024 16:11   Result Date: 01/26/2024 CLINICAL DATA:  Headache, neuro deficit. Concern for subarachnoid hemorrhage. Headache and neck pain beginning after an outdoor hunting excursion. CSF analysis showing pleocytosis and elevated glucose and protein. EXAM: MRI HEAD WITHOUT AND WITH CONTRAST MRA HEAD WITHOUT CONTRAST TECHNIQUE: Multiplanar, multi-echo pulse sequences of the brain and surrounding structures were acquired without and with intravenous contrast. Angiographic images of the Circle of Willis were acquired using MRA technique without intravenous contrast. CONTRAST:  7mL GADAVIST GADOBUTROL 1 MMOL/ML IV SOLN COMPARISON:  CTA head and neck 01/25/2024 FINDINGS: MRI HEAD FINDINGS Brain: There is prominent leptomeningeal enhancement diffusely throughout the supratentorial and infratentorial compartments, and there is also mild diffuse pachymeningeal enhancement. Abnormal FLAIR hyperintensity is present within cerebral sulci. There are 2 subcentimeter foci of restricted diffusion within the right frontal white matter at the level of the centrum semiovale. Mild, relatively diffuse cerebral cortical edema is also suspected bilaterally. As noted on CT, the ventricles have a largely slit like appearance. No mass, midline shift, intracranial hemorrhage, or extra-axial fluid collection is identified. There is a partially empty sella. The cerebellar tonsils extend 7 mm below the  foramen magnum. Vascular: Diffuse venous engorgement with enlargement of medullary and cortical veins as well as distension of the dural venous sinuses. Absence of normal flow voids in the sigmoid sinuses likely reflects slow flow rather than thrombosis given normal enhancement. Skull and upper cervical spine: No suspicious marrow lesion. Sinuses/Orbits: Unremarkable orbits. Mild mucosal thickening in the paranasal sinuses. No significant mastoid fluid. Other: None. MRA HEAD FINDINGS Anterior circulation: The internal carotid arteries are widely patent from skull base to carotid termini. ACAs and MCAs are patent without evidence of a proximal branch occlusion or significant proximal stenosis. No aneurysm or AVM is identified. Posterior circulation: The included portions of the intracranial vertebral arteries are patent to the basilar with the left being strongly dominant. Patent PICA and SCA origins are visualized bilaterally. There patent posterior communicating arteries bilaterally. Both PCAs are patent without evidence of a significant proximal stenosis. No aneurysm or AVM is identified. Anatomic variants: None. IMPRESSION: 1. 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 2. 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 3. 7 mm of cerebellar tonsillar ectopia and diffuse venous engorgement likely related to elevated intracranial pressure. 4. Negative head MRA. Electronically Signed: By: Sebastian Ache M.D. On: 01/26/2024 16:01   MR BRAIN W WO CONTRAST Addendum Date: 01/26/2024 ADDENDUM REPORT: 01/26/2024 16:11 ADDENDUM: These results will be called to the ordering clinician or representative by the Radiologist Assistant, and communication documented in the PACS or [employer]. Electronically Signed   By: Sebastian Ache M.D.   On: 01/26/2024 16:11   Result Date:  01/26/2024 CLINICAL DATA:  Headache, neuro deficit. Concern for subarachnoid hemorrhage. Headache and neck pain beginning after an outdoor hunting excursion. CSF analysis showing pleocytosis and elevated glucose and protein. EXAM: MRI HEAD WITHOUT AND WITH CONTRAST MRA HEAD WITHOUT CONTRAST TECHNIQUE: Multiplanar, multi-echo pulse sequences of the brain and surrounding structures were acquired without and with intravenous contrast. Angiographic images of the Circle of Willis were acquired using MRA technique without intravenous contrast. CONTRAST:  7mL GADAVIST GADOBUTROL 1 MMOL/ML IV SOLN COMPARISON:  CTA head and neck 01/25/2024 FINDINGS: MRI HEAD FINDINGS Brain: There is prominent leptomeningeal enhancement diffusely throughout the supratentorial and infratentorial compartments, and there is also mild diffuse pachymeningeal enhancement. Abnormal  FLAIR hyperintensity is present within cerebral sulci. There are 2 subcentimeter foci of restricted diffusion within the right frontal white matter at the level of the centrum semiovale. Mild, relatively diffuse cerebral cortical edema is also suspected bilaterally. As noted on CT, the ventricles have a largely slit like appearance. No mass, midline shift, intracranial hemorrhage, or extra-axial fluid collection is identified. There is a partially empty sella. The cerebellar tonsils extend 7 mm below the foramen magnum. Vascular: Diffuse venous engorgement with enlargement of medullary and cortical veins as well as distension of the dural venous sinuses. Absence of normal flow voids in the sigmoid sinuses likely reflects slow flow rather than thrombosis given normal enhancement. Skull and upper cervical spine: No suspicious marrow lesion. Sinuses/Orbits: Unremarkable orbits. Mild mucosal thickening in the paranasal sinuses. No significant mastoid fluid. Other: None. MRA HEAD FINDINGS Anterior circulation: The internal carotid arteries are widely patent from skull base to  carotid termini. ACAs and MCAs are patent without evidence of a proximal branch occlusion or significant proximal stenosis. No aneurysm or AVM is identified. Posterior circulation: The included portions of the intracranial vertebral arteries are patent to the basilar with the left being strongly dominant. Patent PICA and SCA origins are visualized bilaterally. There patent posterior communicating arteries bilaterally. Both PCAs are patent without evidence of a significant proximal stenosis. No aneurysm or AVM is identified. Anatomic variants: None. IMPRESSION: 1. 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 2. 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 3. 7 mm of cerebellar tonsillar ectopia and diffuse venous engorgement likely related to elevated intracranial pressure. 4. Negative head MRA. Electronically Signed: By: Sebastian Ache M.D. On: 01/26/2024 16:01   CT ANGIO HEAD NECK W WO CM Result Date: 01/25/2024 CLINICAL DATA:  Neuro deficit, acute, stroke suspected EXAM: CT ANGIOGRAPHY HEAD AND NECK WITH AND WITHOUT CONTRAST TECHNIQUE: Multidetector CT imaging of the head and neck was performed using the standard protocol during bolus administration of intravenous contrast. Multiplanar CT image reconstructions and MIPs were obtained to evaluate the vascular anatomy. Carotid stenosis measurements (when applicable) are obtained utilizing NASCET criteria, using the distal internal carotid diameter as the denominator. RADIATION DOSE REDUCTION: This exam was performed according to the departmental dose-optimization program which includes automated exposure control, adjustment of the mA and/or kV according to patient size and/or use of iterative reconstruction technique. CONTRAST:  75mL OMNIPAQUE IOHEXOL 350 MG/ML SOLN COMPARISON:  None Available. FINDINGS: CT HEAD FINDINGS Brain: No  focal brain insult is seen. There are no visible subarachnoid spaces other than slit like ventricles, raising the possibility of generalized brain swelling or subacute subarachnoid hemorrhage. Vascular: No abnormal vascular finding. Skull: Negative Sinuses/Orbits: Clear/normal Other: None Review of the MIP images confirms the above findings CTA NECK FINDINGS Aortic arch: Normal Right carotid system: Common carotid artery widely patent to the bifurcation. Normal bifurcation. Normal cervical ICA. Left carotid system: Left carotid system similarly normal. Vertebral arteries: Both vertebral artery origins are widely patent. The left is dominant. Both vessels are normal through the cervical region to the foramen magnum. Skeleton: Negative Other neck: No significant finding. Upper chest: Lung apices are clear. Review of the MIP images confirms the above findings CTA HEAD FINDINGS Anterior circulation: Both internal carotid arteries are patent through the skull base and siphon regions. Intracranial detail is limited by motion and artifact from upper extremities. No evidence of vascular occlusion. No aneurysm is identified, but detail is  limited. Posterior circulation: Both vertebral arteries are patent through the foramen magnum. Right may terminate in PICA. Left vertebral artery supplies basilar artery. Basilar detail is limited by motion. Flow is present in both posterior cerebral arteries. Venous sinuses: Patent and normal. Anatomic variants: None significant. Review of the MIP images confirms the above findings IMPRESSION: 1. No focal brain insult is seen. There are no visible subarachnoid spaces other than slit like ventricles, raising the possibility of generalized brain swelling or subacute subarachnoid hemorrhage. 2. No evidence of arterial stenosis or occlusion in the neck. 3. Intracranial detail is limited by motion and artifact from upper extremities. No evidence of vascular occlusion. No aneurysm is identified,  but detail is limited. Electronically Signed   By: Paulina Fusi M.D.   On: 01/25/2024 14:07    Labs:  CBC: Recent Labs    01/28/24 0310 01/29/24 0405 02/01/24 1811 02/01/24 1816 02/02/24 0233  WBC 11.7* 13.4* 14.1*  --  15.2*  HGB 13.9 14.3 16.1 16.0 14.9  HCT 42.9 44.1 50.3 47.0 45.2  PLT 237 250 260  --  260    COAGS: Recent Labs    02/01/24 1811  INR 1.0  APTT 23*    BMP: Recent Labs    01/26/24 0349 01/28/24 0310 01/29/24 0405 02/01/24 1811 02/01/24 1816  NA 137 137 136 139 141  K 4.0 3.7 4.0 3.8 3.8  CL 107 108 109 107 110  CO2 18* 22 18* 21*  --   GLUCOSE 141* 165* 163* 135* 133*  BUN 10 23* 32* 21* 25*  CALCIUM 9.4 9.2 9.0 9.6  --   CREATININE 1.05 1.01 1.09 0.98 1.00  GFRNONAA >60 >60 >60 >60  --     LIVER FUNCTION TESTS: Recent Labs    01/26/24 0349 01/28/24 0310 01/29/24 0405 02/01/24 1811  BILITOT 0.9 1.0 0.8 1.3*  AST 51* 75* 39 20  ALT 120* 203* 156* 76*  ALKPHOS 51 49 47 44  PROT 7.8 7.2 7.0 7.6  ALBUMIN 3.8 3.5 3.5 4.0    TUMOR MARKERS: No results for input(s): "AFPTM", "CEA", "CA199", "CHROMGRNA" in the last 8760 hours.  Assessment and Plan:  Code stroke; multiple venous thrombi: Samuel Warren, 33 year old male, is scheduled today for an image-guided diagnostic cerebral angiogram with possible intervention. The procedure was discussed with the assistance of an in-person sign language interpreter, an in-person [place] interpreter, the patient's mother, the patient's sister, two brothers (one who speaks [place]) and the patient's wife was present via face time. The procedure was discussed multiple times and a video of the procedure was also shown. Dr. Corliss Skains spoke one-on-one with the patient's English-speaking brother and impressed upon him the seriousness of the patient's clinical situation. The consent was signed by the patient's English-speaking brother. The patient's wife also gave verbal consent via Face Time call.   Risks  and benefits of this procedure were discussed with the patient including, but not limited to bleeding, infection, vascular injury or contrast induced renal failure.  This interventional procedure involves the use of X-rays and because of the nature of the planned procedure, it is possible that we will have prolonged use of X-ray fluoroscopy.  Potential radiation risks to you include (but are not limited to) the following: - A slightly elevated risk for cancer  several years later in life. This risk is typically less than 0.5% percent. This risk is low in comparison to the normal incidence of human cancer, which is 33% for women and 50% for  men according to the American Cancer Society. - Radiation induced injury can include skin redness, resembling a rash, tissue breakdown / ulcers and hair loss (which can be temporary or permanent).   The likelihood of either of these occurring depends on the difficulty of the procedure and whether you are sensitive to radiation due to previous procedures, disease, or genetic conditions.   IF your procedure requires a prolonged use of radiation, you will be notified and given written instructions for further action.  It is your responsibility to monitor the irradiated area for the 2 weeks following the procedure and to notify your physician if you are concerned that you have suffered a radiation induced injury.    All of the patient's questions were answered, patient is agreeable to proceed. He has been NPO.   Consent signed and in chart.  Thank you for this interesting consult.  I greatly enjoyed meeting Sony Hainsworth and look forward to participating in their care.  A copy of this report was sent to the requesting provider on this date.  Electronically Signed: Alwyn Ren, AGACNP-BC 02/02/2024, 9:06 AM   I spent a total of 40 Minutes    in face to face in clinical consultation, greater than 50% of which was counseling/coordinating care for multiple  venous thrombi.

## 2024-02-02 NOTE — Transfer of Care (Signed)
 Immediate Anesthesia Transfer of Care Note  Patient: Samuel Warren  Procedure(s) Performed: RADIOLOGY WITH ANESTHESIA  Patient Location: PACU  Anesthesia Type:General  Level of Consciousness: Patient remains intubated per anesthesia plan  Airway & Oxygen Therapy: Patient remains intubated per anesthesia plan and Patient placed on Ventilator (see vital sign flow sheet for setting)  Post-op Assessment: Report given to RN and Post -op Vital signs reviewed and stable  Post vital signs: Reviewed and stable  Last Vitals:  Vitals Value Taken Time  BP 113/76 02/02/24 1930  Temp 35.4 C 02/02/24 1943  Pulse 56 02/02/24 1943  Resp 18 02/02/24 1943  SpO2 100 % 02/02/24 1943  Vitals shown include unfiled device data.  Last Pain:  Vitals:   02/02/24 1200  TempSrc: Oral  PainSc:          Complications: No notable events documented.

## 2024-02-02 NOTE — Progress Notes (Signed)
 PHARMACY - ANTICOAGULATION CONSULT NOTE  Pharmacy Consult for heparin Indication:  DVST   No Known Allergies  Patient Measurements: Height: 5\' 4"  (162.6 cm) Weight: 68.9 kg (151 lb 14.4 oz) IBW/kg (Calculated) : 59.2 Heparin Dosing Weight: 74.9  Vital Signs: Temp: 98.1 F (36.7 C) (03/24 1200) Temp Source: Oral (03/24 1200) BP: 110/77 (03/24 1430) Pulse Rate: 57 (03/24 1430)  Labs: Recent Labs    02/01/24 1811 02/01/24 1816 02/02/24 0233 02/02/24 0658 02/02/24 1403  HGB 16.1 16.0 14.9  --   --   HCT 50.3 47.0 45.2  --   --   PLT 260  --  260  --   --   APTT 23*  --   --   --   --   LABPROT 13.3  --   --   --   --   INR 1.0  --   --   --   --   HEPARINUNFRC  --   --  0.21* 0.39 0.66  CREATININE 0.98 1.00  --   --   --     Estimated Creatinine Clearance: 87.2 mL/min (by C-G formula based on SCr of 1 mg/dL).   Medical History: Past Medical History:  Diagnosis Date   Deaf     Assessment: Patient presented as stroke alert with CC of dizziness, nausea and headache. CTA showing dural sinus thrombosis. Not on anticoagulation prior to admission. Pharmacy consulted to dose heparin gtt.   Heparin level 0.66, supratherapeutic for lower goal with heparin infusion at 1100 units/h. Hgb 14.9, Plt 260, wnl. No signs/symptoms of bleeding noted. Heparin infusion was turned off for IR procedure after the level was drawn.   Pt now s/p IR and Dr. Corliss Skains would like to restart heparin at 2100.  Goal of Therapy:  Heparin level 0.3-0.5 units/ml Monitor platelets by anticoagulation protocol: Yes   Plan:  AT 2100, restart heparin infusion at 1000 units/hr  F/u heparin level 8 hours post restart Continue to monitor CBC and for signs/symptoms of bleeding Follow up transition to DOAC  Christoper Fabian, PharmD, BCPS Please see amion for complete clinical pharmacist phone list 02/02/2024 7:51 PM

## 2024-02-03 ENCOUNTER — Encounter (HOSPITAL_COMMUNITY): Payer: Self-pay | Admitting: Interventional Radiology

## 2024-02-03 DIAGNOSIS — J9600 Acute respiratory failure, unspecified whether with hypoxia or hypercapnia: Secondary | ICD-10-CM | POA: Diagnosis not present

## 2024-02-03 DIAGNOSIS — G08 Intracranial and intraspinal phlebitis and thrombophlebitis: Secondary | ICD-10-CM | POA: Diagnosis not present

## 2024-02-03 LAB — CBC WITH DIFFERENTIAL/PLATELET
Abs Immature Granulocytes: 0.16 10*3/uL — ABNORMAL HIGH (ref 0.00–0.07)
Basophils Absolute: 0 10*3/uL (ref 0.0–0.1)
Basophils Relative: 0 %
Eosinophils Absolute: 0 10*3/uL (ref 0.0–0.5)
Eosinophils Relative: 0 %
HCT: 37.5 % — ABNORMAL LOW (ref 39.0–52.0)
Hemoglobin: 12.1 g/dL — ABNORMAL LOW (ref 13.0–17.0)
Immature Granulocytes: 2 %
Lymphocytes Relative: 11 %
Lymphs Abs: 1.1 10*3/uL (ref 0.7–4.0)
MCH: 26.8 pg (ref 26.0–34.0)
MCHC: 32.3 g/dL (ref 30.0–36.0)
MCV: 83 fL (ref 80.0–100.0)
Monocytes Absolute: 0.8 10*3/uL (ref 0.1–1.0)
Monocytes Relative: 7 %
Neutro Abs: 8.3 10*3/uL — ABNORMAL HIGH (ref 1.7–7.7)
Neutrophils Relative %: 80 %
Platelets: 237 10*3/uL (ref 150–400)
RBC: 4.52 MIL/uL (ref 4.22–5.81)
RDW: 13.1 % (ref 11.5–15.5)
WBC: 10.3 10*3/uL (ref 4.0–10.5)
nRBC: 0 % (ref 0.0–0.2)

## 2024-02-03 LAB — HEPARIN LEVEL (UNFRACTIONATED)
Heparin Unfractionated: >1.1 [IU]/mL — ABNORMAL HIGH (ref 0.30–0.70)
Heparin Unfractionated: 0.85 [IU]/mL — ABNORMAL HIGH (ref 0.30–0.70)
Heparin Unfractionated: <0.1 [IU]/mL — ABNORMAL LOW (ref 0.30–0.70)

## 2024-02-03 LAB — BASIC METABOLIC PANEL
Anion gap: 4 — ABNORMAL LOW (ref 5–15)
BUN: 13 mg/dL (ref 6–20)
CO2: 21 mmol/L — ABNORMAL LOW (ref 22–32)
Calcium: 8.3 mg/dL — ABNORMAL LOW (ref 8.9–10.3)
Chloride: 115 mmol/L — ABNORMAL HIGH (ref 98–111)
Creatinine, Ser: 0.79 mg/dL (ref 0.61–1.24)
GFR, Estimated: >60 mL/min (ref >60)
Glucose, Bld: 109 mg/dL — ABNORMAL HIGH (ref 70–99)
Potassium: 3.6 mmol/L (ref 3.5–5.1)
Sodium: 140 mmol/L (ref 135–145)

## 2024-02-03 LAB — MAGNESIUM: Magnesium: 2.1 mg/dL (ref 1.7–2.4)

## 2024-02-03 LAB — TRIGLYCERIDES: Triglycerides: 155 mg/dL — ABNORMAL HIGH (ref <150)

## 2024-02-03 LAB — PHOSPHORUS: Phosphorus: 3.9 mg/dL (ref 2.5–4.6)

## 2024-02-03 MED ORDER — HEPARIN (PORCINE) 25000 UT/250ML-% IV SOLN
750.0000 [IU]/h | INTRAVENOUS | Status: DC
  Administered 2024-02-04: 700 [IU]/h via INTRAVENOUS
  Filled 2024-02-03: qty 250

## 2024-02-03 MED ORDER — HEPARIN (PORCINE) 25000 UT/250ML-% IV SOLN: 800.0000 [IU]/h | INTRAVENOUS | Status: DC

## 2024-02-03 MED ORDER — POTASSIUM CHLORIDE 20 MEQ PO PACK: 40.0000 meq | PACK | Freq: Once | ORAL | Status: DC

## 2024-02-03 MED ORDER — ACETAMINOPHEN 10 MG/ML IV SOLN
1000.0000 mg | Freq: Four times a day (QID) | INTRAVENOUS | Status: AC | PRN
  Administered 2024-02-03: 1000 mg via INTRAVENOUS
  Filled 2024-02-03: qty 100

## 2024-02-03 MED ORDER — POTASSIUM CHLORIDE 10 MEQ/100ML IV SOLN
10.0000 meq | INTRAVENOUS | Status: AC
  Administered 2024-02-03 (×3): 10 meq via INTRAVENOUS
  Filled 2024-02-03 (×3): qty 100

## 2024-02-03 NOTE — Procedures (Signed)
 Extubation Procedure Note  Patient Details:   Name: Samuel Warren DOB: [birth year] MRN: [id number]   Airway Documentation:    Vent end date: 02/03/24 Vent end time: 1658   Evaluation  O2 sats: stable throughout Complications: No apparent complications Patient did tolerate procedure well. Bilateral Breath Sounds: Clear, Diminished   No Pt is deaf and cannot understand the command but able to cough to clear secretions with eyes open.  Dewain Penning T 02/03/2024, 4:51 PM

## 2024-02-03 NOTE — Plan of Care (Signed)
  Problem: Clinical Measurements: Goal: Ability to maintain clinical measurements within normal limits will improve Outcome: Progressing Goal: Will remain free from infection Outcome: Progressing Goal: Diagnostic test results will improve Outcome: Progressing Goal: Respiratory complications will improve Outcome: Progressing Goal: Cardiovascular complication will be avoided Outcome: Progressing   Problem: Activity: Goal: Risk for activity intolerance will decrease Outcome: Progressing   Problem: Coping: Goal: Level of anxiety will decrease Outcome: Progressing   Problem: Elimination: Goal: Will not experience complications related to urinary retention Outcome: Progressing   Problem: Pain Managment: Goal: General experience of comfort will improve and/or be controlled Outcome: Progressing   Problem: Safety: Goal: Ability to remain free from injury will improve Outcome: Progressing   Problem: Skin Integrity: Goal: Risk for impaired skin integrity will decrease Outcome: Progressing   Problem: Cardiovascular: Goal: Vascular access site(s) Level 0-1 will be maintained Outcome: Progressing   Problem: Education: Goal: Knowledge of General Education information will improve Description: Including pain rating scale, medication(s)/side effects and non-pharmacologic comfort measures Outcome: Not Progressing   Problem: Health Behavior/Discharge Planning: Goal: Ability to manage health-related needs will improve Outcome: Not Progressing   Problem: Nutrition: Goal: Adequate nutrition will be maintained Outcome: Not Progressing   Problem: Elimination: Goal: Will not experience complications related to bowel motility Outcome: Not Progressing   Problem: Education: Goal: Understanding of CV disease, CV risk reduction, and recovery process will improve Outcome: Not Progressing Goal: Individualized Educational Video(s) Outcome: Not Progressing   Problem: Activity: Goal:  Ability to return to baseline activity level will improve Outcome: Not Progressing   Problem: Cardiovascular: Goal: Ability to achieve and maintain adequate cardiovascular perfusion will improve Outcome: Not Progressing   Problem: Health Behavior/Discharge Planning: Goal: Ability to safely manage health-related needs after discharge will improve Outcome: Not Progressing

## 2024-02-03 NOTE — Progress Notes (Signed)
 PCCM Brief Note  Propofol on 60, pt awake looking around room, follows simple commands. Weaning on SBT 5/5 well. Propofol stopped. 5 minutes later, stable with RSBI 20 - 30. Looks comfortable. Opens eyes to voice, still looking around room.  Have asked RT to wait 5 more minutes and if no changes, proceed with extubation.   [name], PA - [facility] For pager details, please see AMION or use Epic chat  After 1900, please call [facility] for cross coverage needs 02/03/2024, 4:38 PM

## 2024-02-03 NOTE — Progress Notes (Signed)
 PHARMACY - ANTICOAGULATION  Pharmacy Consult for heparin Indication: cerebral venous sinus thrombosis   No Known Allergies  Patient Measurements: Height: 5\' 4"  (162.6 cm) Weight: 74.2 kg (163 lb 9.3 oz) IBW/kg (Calculated) : 59.2 Heparin Dosing Weight: 74.9  Vital Signs: Temp: 97.9 F (36.6 C) (03/25 1630) BP: 108/73 (03/25 1900) Pulse Rate: 46 (03/25 1900)  Labs: Recent Labs    02/01/24 1811 02/01/24 1816 02/02/24 0233 02/02/24 0658 02/02/24 1957 02/03/24 0505 02/03/24 1325 02/03/24 2157  HGB 16.1 16.0 14.9  --  12.9* 12.1*  --   --   HCT 50.3 47.0 45.2  --  38.0* 37.5*  --   --   PLT 260  --  260  --   --  237  --   --   APTT 23*  --   --   --   --   --   --   --   LABPROT 13.3  --   --   --   --   --   --   --   INR 1.0  --   --   --   --   --   --   --   HEPARINUNFRC  --   --  0.21*   < >  --  >1.10* 0.85* <0.10*  CREATININE 0.98 1.00  --   --   --  0.79  --   --    < > = values in this interval not displayed.    Estimated Creatinine Clearance: 120 mL/min (by C-G formula based on SCr of 0.79 mg/dL).  Assessment: 33 y.o. male with dural sinus thrombosis s/p revascularization in IR 3/24 for heparin.  Received heparin 3000 units IV periprocedure and heparin restarted post procedure.   Heparin level now down to undetectable after being elevated x 2 earlier today. No issues with line or bleeding reported per RN.  Goal of Therapy:  Heparin level 0.3-0.5 units/ml Monitor platelets by anticoagulation protocol: Yes   Plan:  Increase heparin back to 700 units/hr Check heparin level in 6 hours  Christoper Fabian, PharmD, BCPS Please see amion for complete clinical pharmacist phone list 02/03/2024,10:25 PM

## 2024-02-03 NOTE — Progress Notes (Signed)
 Initial Nutrition Assessment  DOCUMENTATION CODES:   Not applicable  INTERVENTION:  Recommend: Initiate tube feeding via NG/Cortrak if placed: Osmolite 1.5 at 50 ml/h (1200 ml per day)  Prosource TF20 60 ml BID  Provides 1960 kcal, 115 gm protein, 914 ml free water daily  Will monitor for need for Cortrak and monitor diet advancement  NUTRITION DIAGNOSIS:  Inadequate oral intake related to inability to eat as evidenced by NPO status.  GOAL:  Patient will meet greater than or equal to 90% of their needs  MONITOR:  TF tolerance, Vent status, Labs  REASON FOR ASSESSMENT:  Ventilator   ASSESSMENT:  Pt with hx of recent admission for meningitis admitted for acute cerebral venous thrombosis. Pt hearing impaired and requires [place] sign language interpreter.  3/23 admitted to ICU;  3/24 intubated and sedated; OR for vascularization of L internal jugular, L sigmoid sinus and transverse sinus; continued occlusion of sagittal sinus   Pt discussed during ICU rounds and with RN.  Neurology following closely. Vent support for respiratory failure. Spoke with RN who reported plan to wean off sedation today 3/25 and try to extubate pt in morning to check neuro function/status. If poor, may return to OR to see if neuro can remove remaining blood clots. Artery sheaths removed this morning, RN will sit pt up around 5pm in preparation for extubation tomorrow 3/26. Pt currently has no access to feed, possible Cortrak placement 3/26 if neuro evaluation is poor.  Spoke with pt's brother who was at bedside, brother speaks [place] but mother does not. Pt sedated during assessment. Pt's brother reports pt had been eating prior to previous admission for meningitis 3/16-3/20. He reports pt ate 3-4x per day and typically ate home cooked meals prepared by mother. Brother reports pt continued to eat well during last admission and reports mother would bring pt food from home to help intake. Once pt was  discharged, brother reports he was dealing with severe constipation which caused appetite to be poor. Pt's brother estimated that pt had not had a bowel movement in 10 days. RN discussed they may administer dulcolax but have not yet. Brother reports pt was active PTA and worked on his feet as well as spent a lot of times outdoors fishing and hunting. Brother unsure of pt's typical weight, but reports he has not noticed or heard pt talk about any weight loss. Nutrition focused physical exam shows adequate fat and muscle stores, pt appears well nourished at baseline despite poor appetite week leading up to admission.  Patient is currently intubated on ventilator support MV: 8.8 L/min Temp (24hrs), Avg:97 F (36.1 C), Min:95.5 F (35.3 C), Max:98.1 F (36.7 C)  MAP (3/25):  Propofol: 24.8 ml/hr Phenylephrine: 63mcg/min  Admit weight: 74.2kg  Current weight: 74.2kg   Intake/Output Summary (Last 24 hours) at 02/03/2024 1608 Last data filed at 02/03/2024 1600 Gross per 24 hour  Intake 4520.42 ml  Output 5025 ml  Net -504.58 ml   Net IO Since Admission: -762.8 mL [02/03/24 1608] EBL: UOP: 5.2L  Drains/Lines: Peripheral IV L antecubital and R forearm Arterial Line L radial Endotracheal Tube Urethral Catheter  Nutritionally Relevant Medications: Propofol 24.53ml/hr which provides 654kcal/day Sodium chloride  Labs Reviewed: Hgb 11.0 HgbA1c 5.7  NUTRITION - FOCUSED PHYSICAL EXAM:  Flowsheet Row Most Recent Value  Orbital Region No depletion  Upper Arm Region No depletion  Thoracic and Lumbar Region No depletion  Buccal Region Unable to assess  Temple Region No depletion  Clavicle  Bone Region No depletion  Clavicle and Acromion Bone Region No depletion  Scapular Bone Region No depletion  Dorsal Hand Unable to assess  [mittens]  Patellar Region No depletion  Anterior Thigh Region No depletion  Posterior Calf Region No depletion  Edema (RD Assessment) None  Hair  Reviewed  Eyes Unable to assess  Mouth Unable to assess  Skin Reviewed  [bruising on arms]  Nails Unable to assess   Diet Order:   Diet Order             Diet NPO time specified  Diet effective now                  EDUCATION NEEDS:   Not appropriate for education at this time  Skin:  Skin Assessment: Reviewed RN Assessment  Last BM:  10 days ago, constipation  Height:  Ht Readings from Last 1 Encounters:  02/01/24 5\' 4"  (1.626 m)    Weight:  Wt Readings from Last 1 Encounters:  02/03/24 74.2 kg    Ideal Body Weight:  59 kg  BMI:  Body mass index is 28.08 kg/m.  Estimated Nutritional Needs:   Kcal:  1900-2100  Protein:  90-110g  Fluid:  >2L  Louis Meckel Dietetic Intern

## 2024-02-03 NOTE — Progress Notes (Signed)
 NAME:  Samuel Warren, MRN:  [id number], DOB:  [birth year], LOS: 2 ADMISSION DATE:  02/01/2024, CONSULTATION DATE:  02/01/24 REFERRING MD: neuro, CHIEF COMPLAINT:  R weakness   History of Present Illness:  33 yo male with recent hospital admission and discharge 2/2 vasculitis and meningeal encephalitis req doxy after recent wild boar hunting trip per chart review and brother at bedside. Pt is unable to provide history 2/2 R UE deficits at this time and hearing impairment requiring him to utilize ASL but unfortunately at this time unable to. Pt appears comfortable but is much weaker on R upper and lower rather than L.   Per brother pt was discharged 2-3 days ago on doxy after being found to have meningitis. His mother called him and stated he was not doing well and needed to go to hospital. She described he was unable to move or respond (per the brother). She states he was last known normal at 1100.  He also reportedly complained of HA, N/V. Pt's family ultimately called EMS and code stroke initiated. CTH in ed revealed multiple venous thrombi without acute hypodensity. Neuro was called for admission and due to the lack of acute findings for cva but need for neuro checks q1hr ccm was asked for admission.     Pertinent  Medical History  Meningoencephalitis Hearing impairment   Significant Hospital Events: Including procedures, antibiotic start and stop dates in addition to other pertinent events   Readmitted to ICU 3/23 3/24 Left common carotid carotid arteriogram and transverse sinus sigmoid sinus with occluded posterior half of superior sagittal sinus and occluded dominant left transverse sinus, left sigmoid sinus and proximal internal jugular vein. Completed doxy for prior negative culture meningoencephalitis  Interim History / Subjective:  S/p revascularization of left internal jugular, left sigmoid sinus and left transverse sinus however superior sagittal sinus remained occluded.  Returned to  ICU intubated  Objective   Blood pressure 114/71, pulse (!) 45, temperature (!) 97.5 F (36.4 C), resp. rate 15, height 5\' 4"  (1.626 m), weight 74.2 kg, SpO2 100%.    Vent Mode: PSV;CPAP FiO2 (%):  [40 %-100 %] 40 % Set Rate:  [18 bmp] 18 bmp Vt Set:  [470 mL] 470 mL PEEP:  [5 cmH20] 5 cmH20 Pressure Support:  [5 cmH20] 5 cmH20 Plateau Pressure:  [15 cmH20] 15 cmH20   Intake/Output Summary (Last 24 hours) at 02/03/2024 0916 Last data filed at 02/03/2024 0900 Gross per 24 hour  Intake 3986.55 ml  Output 4775 ml  Net -788.45 ml   Filed Weights   02/01/24 2200 02/02/24 0500 02/03/24 0500  Weight: 68.9 kg 68.9 kg 74.2 kg    Physical Exam: General: Chronically ill-appearing, sedated HENT: [place], AT, ETT in place Eyes: EOMI, no scleral icterus Respiratory: Clear to auscultation bilaterally.  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 Superior sagittal sinus remained occluded. - Neuro and Neuro IR following - Neuro checks - Cleviprex for SBP 120-140 per Neuro - Heparin gtt per Neuro - Will need PT/OT  Acute respiratory insufficiency 2/2 above - Full vent support - LTVV, 4-8cc/kg IBW with goal Pplat<30 and DP<15 - SBT/WUA. Hold on extubation until sheaths removed - VAP -  PAD protocol for RASS -1 on propofol  Deaf and communicates via [place] sign language. - Call [phone] M-F [phone], [phone] for after hours pager.  Constipation. - Colace, Dulcolax.   Best Practice (right click and "Reselect all SmartList Selections" daily)  Diet/type: NPO DVT prophylaxis systemic heparin Pressure ulcer(s):  N/A GI prophylaxis: N/A Lines: N/A Foley:  N/A Code Status:  full code Last date of multidisciplinary goals of care discussion [per brother at bedside. ]   Critical care time: 30 min    The patient is critically ill with multiple organ systems failure and requires high complexity decision making for assessment and support, frequent evaluation and titration of therapies, application of advanced monitoring technologies and extensive interpretation of multiple databases.  Independent Critical Care Time: 35 Minutes.   Mechele Collin, M.D. [facility] Pulmonary/Critical Care Medicine 02/03/2024 9:16 AM   Please see Amion for pager number to reach on-call Pulmonary and Critical Care Team.

## 2024-02-03 NOTE — Progress Notes (Signed)
 Referring Provider(s): Dr. Delia Heady, MD  Supervising Physician: Julieanne Cotton  Patient Status:  [facility] - In-pt  Chief Complaint: Acute cerebral venous sinus thrombosis  Brief History:  33 yo male with recent hospital admission and discharge s/p vasculitis and meningeal encephalitis, after recent wild boar hunting, and treated with doxy. Patient is deaf and unable to sign ASL. His family is [place] and speak [place]. He presented by EMS on 3/23 as code stroke, and CTH revealed multiple venous thrombi. He was admitted under Neuro service and underwent revascularization of the internal jugular vein, left sigmoid sinus and the left transverse sinus with Dr. Corliss Skains on 3/24. The superior sagittal sinus remains occluded.   Subjective:  Patient remains intubated and sedated. Unable to assess patient. Family at bedside. Answered their questions. Per RN, patient was responsive to touch while not sedated yesterday.   Allergies: Patient has no known allergies.  Medications: Prior to Admission medications   Medication Sig Start Date End Date Taking? Authorizing Provider  aspirin EC 81 MG tablet Take 1 tablet (81 mg total) by mouth daily. Swallow whole. 01/29/24  Yes Glade Lloyd, MD  doxycycline (VIBRA-TABS) 100 MG tablet Take 1 tablet (100 mg total) by mouth 2 (two) times daily with a meal for 7 days. 01/29/24 02/05/24 Yes Glade Lloyd, MD  ondansetron (ZOFRAN) 4 MG tablet Take 1 tablet (4 mg total) by mouth every 6 (six) hours as needed for nausea. 01/29/24  Yes Glade Lloyd, MD     Vital Signs: BP 114/71   Pulse (!) 45   Temp (!) 97.5 F (36.4 C)   Resp 15   Ht 5\' 4"  (1.626 m)   Wt 163 lb 9.3 oz (74.2 kg)   SpO2 100%   BMI 28.08 kg/m   Physical Exam Constitutional:      Appearance: He is ill-appearing.     Comments: Patient sedated and intubated.  Cardiovascular:     Rate and Rhythm: Normal rate and regular rhythm.  Pulmonary:     Comments: Patient is  vented. Musculoskeletal:     Comments: Right venous femoral sheath and Left arterial femoral sheaths in place. Appropriately dressed.  Skin:    General: Skin is warm and dry.  Neurological:     Comments: Patient is sedated      Labs:  CBC: Recent Labs    01/29/24 0405 02/01/24 1811 02/01/24 1816 02/02/24 0233 02/02/24 1957 02/03/24 0505  WBC 13.4* 14.1*  --  15.2*  --  10.3  HGB 14.3 16.1 16.0 14.9 12.9* 12.1*  HCT 44.1 50.3 47.0 45.2 38.0* 37.5*  PLT 250 260  --  260  --  237    COAGS: Recent Labs    02/01/24 1811  INR 1.0  APTT 23*    BMP: Recent Labs    01/28/24 0310 01/29/24 0405 02/01/24 1811 02/01/24 1816 02/02/24 1957 02/03/24 0505  NA 137 136 139 141 143 140  K 3.7 4.0 3.8 3.8 3.9 3.6  CL 108 109 107 110  --  115*  CO2 22 18* 21*  --   --  21*  GLUCOSE 165* 163* 135* 133*  --  109*  BUN 23* 32* 21* 25*  --  13  CALCIUM 9.2 9.0 9.6  --   --  8.3*  CREATININE 1.01 1.09 0.98 1.00  --  0.79  GFRNONAA >60 >60 >60  --   --  >60    LIVER FUNCTION TESTS: Recent Labs    01/26/24 0349  01/28/24 0310 01/29/24 0405 02/01/24 1811  BILITOT 0.9 1.0 0.8 1.3*  AST 51* 75* 39 20  ALT 120* 203* 156* 76*  ALKPHOS 51 49 47 44  PROT 7.8 7.2 7.0 7.6  ALBUMIN 3.8 3.5 3.5 4.0    Assessment and Plan:  33 yo male s/p recent inpatient stay and discharge for infective meningitis, treated with doxy, who presented as a Code Stroke with extensive venous cerebral clots. He is s/p revascularization of the internal jugular vein, left sigmoid sinus and the left transverse sinus with Dr. Corliss Skains on 3/24. The superior sagittal sinus remains occluded.   Plan to have patient's bilateral femoral sheaths removed today and sedation waned to assess neurologic function. IR tech will remove femoral sheaths this AM. Nurse to wane sedation per protocol.  Maintain anticoagulation with Heparin drip.  Management per Neuro recommendations.     Electronically Signed: Sable Feil, PA-C 02/03/2024, 9:30 AM     I spent a total of 15 Minutes at the the patient's bedside AND on the patient's hospital floor or unit, greater than 50% of which was counseling/coordinating care for acute cerebral venous sinus thrombosis.

## 2024-02-03 NOTE — Progress Notes (Signed)
 Pt placed on PS/CPAP 5/5 on 40% and is tolerating well. RT will monitor.

## 2024-02-03 NOTE — Progress Notes (Signed)
 STROKE TEAM PROGRESS NOTE   INTERIM HISTORY/SUBJECTIVE recent admission to the hospital from 3/16-3/20 for acute meningoencephalitis which was treated with antibiotics p/w dizzy, with headache, nausea and also some vomiting  CT head reveals multiple thrombosed cerebral veins including the SSS, straight sinus, inferior sagittal sinus and both transverse sinuses left worse than right. No acute hypodensity is seen  CTA of head and neck with CTP: Diffuse dural sinus thrombosis  CTV: Extensive dural sinus thrombus within the posterior aspect of the superior sagittal sinus, the straight sinus, the inferior sagittal sinus and left greater than right transverse sinus. Thrombus extends into the left sigmoid sinus and visualized left internal jugular vein. Innumerable enlarged and tortuous cortical veins are visualized, consistent with collateral venous drainage. Enlarged left petrosal veins are present in the middle cranial fossa.   Yesterday after discussion with family  about risk benefits of mechanical thrombectomy patient was taken for clot extraction by Dr. Corliss Skains.  He was able to open up left transverse and sigmoid sinuses but the clot was quite thick and he was unable to the superior sagittal sinus and transverse and sigmoid sinus on the right.  Patient was left intubated overnight with arterial and venous sheath in the right groin with plans for follow-up CT this morning to decide if he needed further intervention.  This morning exam is limited as patient is sedated on propofol.  Response to noxious stimuli when moving both upper extremities and right lower extremity purposefully and only trace withdrawal in the left lower extremity He remains on normal saline infusion and heparin infusion. Follow-up CT head this morning appears to be stable with persistent cerebral edema and low-lying cerebellar tonsils.  No new hemorrhage or abnormality. OBJECTIVE  CBC    Component Value Date/Time   WBC 10.3  02/03/2024 0505   RBC 4.52 02/03/2024 0505   HGB 12.1 (L) 02/03/2024 0505   HCT 37.5 (L) 02/03/2024 0505   PLT 237 02/03/2024 0505   MCV 83.0 02/03/2024 0505   MCH 26.8 02/03/2024 0505   MCHC 32.3 02/03/2024 0505   RDW 13.1 02/03/2024 0505   LYMPHSABS 1.1 02/03/2024 0505   MONOABS 0.8 02/03/2024 0505   EOSABS 0.0 02/03/2024 0505   BASOSABS 0.0 02/03/2024 0505    BMET    Component Value Date/Time   NA 140 02/03/2024 0505   K 3.6 02/03/2024 0505   CL 115 (H) 02/03/2024 0505   CO2 21 (L) 02/03/2024 0505   GLUCOSE 109 (H) 02/03/2024 0505   BUN 13 02/03/2024 0505   CREATININE 0.79 02/03/2024 0505   CALCIUM 8.3 (L) 02/03/2024 0505   GFRNONAA >60 02/03/2024 0505    IMAGING past 24 hours CT HEAD WO CONTRAST ( ) Result Date: 02/03/2024 CLINICAL DATA:  Clot related to COVID EXAM: CT HEAD WITHOUT CONTRAST TECHNIQUE: Contiguous axial images were obtained from the base of the skull through the vertex without intravenous contrast. RADIATION DOSE REDUCTION: This exam was performed according to the departmental dose-optimization program which includes automated exposure control, adjustment of the mA and/or kV according to patient size and/or use of iterative reconstruction technique. COMPARISON:  CT head February 02, 2024. FINDINGS: Brain: No evidence of acute hemorrhage, mass lesion, midline shift or hydrocephalus. Known small infarcts better seen on recent MRI. Similar cerebral edema and low lying tonsils. Vascular: Redemonstrated diffusely hyperdense dural venous sinuses, compatible with known thrombus. Skull: No acute fracture. Sinuses/Orbits: Mild paranasal sinus mucosal thickening. No acute orbital findings. IMPRESSION: Redemonstrated dural venous sinus thrombosis, better characterized on  prior studies. Similar cerebral edema and low lying tonsils. No evidence of new/interval abnormality by CT. Electronically Signed   By: Feliberto Harts M.D.   On: 02/03/2024 02:59    Vitals:   02/03/24  1259 02/03/24 1300 02/03/24 1328 02/03/24 1330  BP:  97/63  100/64  Pulse: (!) 47 (!) 46 (!) 49 (!) 48  Resp: 10 12 12 12   Temp: (!) 97.3 F (36.3 C) (!) 97.3 F (36.3 C) (!) 97.3 F (36.3 C) (!) 97.3 F (36.3 C)  TempSrc:      SpO2: 100% 100% 100% 100%  Weight:      Height:         PHYSICAL EXAM General: Patient is sedated and intubated psych:  Mood and affect appropriate for situation CV: Regular rate and rhythm on monitor Respiratory:  Regular, unlabored respirations on room air GI: Abdomen soft and nontender   NEURO:  Mental Status: Sedated and intubated speech/Language: deaf   Cranial Nerves:  II: PERRL. Visual fields unable to test III, IV, VI: EOMI. doll's eye movements are present V: Sensation is intact to light touch and symmetrical to face.  VII: Face is symmetrical   VIII: hearing intact to voice. IX, X: Palate cough and gag present WU:[id number] shrug unable to test XII: tongue is midline without fasciculations. Motor: Withdraws bilateral upper and right lower extremities well.  Trace withdrawal in the left lower extremity to painful stimulus  tone: is normal and bulk is normal Sensation- Intact to light touch bilaterally. Extinction unable to test coordination: FTN intact unable to test gait- deferred   ASSESSMENT/PLAN  Samuel Warren is a 33 y.o. 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  NIH on Admission 4  CVST  in  SSS, straight sinus, inferior sagittal sinus and both transverse sinuses  Etiology:  due to delayed complication of recent viral meningoencephalitis  Code Stroke CT head multiple thrombosed cerebral veins including the SSS, straight sinus, inferior sagittal sinus and both transverse sinuses left worse than right. No acute hypodensity is seen  CTA head & neck  Diffuse dural  sinus thrombosis  CTV Extensive dural sinus thrombus within the posterior aspect of the superior sagittal sinus, the straight sinus, the inferior sagittal sinus and left greater than right transverse sinus. Thrombus extends into the left sigmoid sinus and visualized left internal jugular vein. Innumerable enlarged and tortuous cortical veins are visualized, consistent with collateral venous drainage. Enlarged left petrosal veins are present in the middle cranial fossa Repeat CT head Diffuse hyperdensity throughout the major dural sinuses.  ssociated cerebral edema with diffuse loss of cortical sulcation and 7 mm of cerebellar tonsillar ectopia, stable. 2D Echo 3/18 EF 45 to 50%. Apical hypokinesis with overall mild LV dysfunction  LDL 279 HgbA1c 5.7 VTE prophylaxis -for an IV No antithrombotic prior to admission, now on heparin IV will need transition to Eliquis in 3 to 5 days Therapy recommendations:  Pending Disposition:  pending  Hx of Stroke/TIA Recent viral meningitis January 27, 2024 right frontal 2-3 punctate infarcts, etiology: Likely due to infectious vasculitis vs. Small vessel disease in the setting of CNS infection  Completed course of Doxy  Hyperlipidemia Home meds: Atorvastatin 80,  resumed in hospital LDL 279, goal < 70 Continue statin at discharge  Dysphagia Patient has post-stroke dysphagia, SLP consulted    Diet  Diet NPO time specified   Advance diet as tolerated   Other Active Problems   Hospital day # 2 Patient went for mechanical thrombectomy yesterday but only part of the clot was successfully removed.  Follow-up CT scan shows stable appearance.  Discussed with Dr. Corliss Skains plan to DC groin sheaths and wean sedation and extubate later today.  Continue IV heparin and aggressive IV hydration will switch to Eliquis in a few days.  Discussed with Dr. Corliss Skains and Dr. Everardo All critical care medicine.  Discussed with patient's brother at the bedside and answered  questions. This patient is critically ill and at significant risk of neurological worsening, death and care requires constant monitoring of vital signs, hemodynamics,respiratory and cardiac monitoring, extensive review of multiple databases, frequent neurological assessment, discussion with family, other specialists and medical decision making of high complexity.I have made any additions or clarifications directly to the above note.This critical care time does not reflect procedure time, or teaching time or supervisory time of PA/NP/Med Resident etc but could involve care discussion time.  I spent 30 minutes of neurocritical care time  in the care of  this patient.     Delia Heady, MD Medical Director [facility] Pager: [phone] 02/03/2024 2:30 PM   To contact Stroke Continuity provider, please refer to [email or web address]. After hours, contact General Neurology

## 2024-02-03 NOTE — Progress Notes (Signed)
 PHARMACY - ANTICOAGULATION Pharmacy Consult for heparin Indication: cerebral venous sinus thrombosis  Brief A/P: Heparin level supratherapeutic Decrease Heparin rate  No Known Allergies  Patient Measurements: Height: 5\' 4"  (162.6 cm) Weight: 74.2 kg (163 lb 9.3 oz) IBW/kg (Calculated) : 59.2 Heparin Dosing Weight: 74.9  Vital Signs: Temp: 96.8 F (36 C) (03/25 0630) BP: 106/67 (03/25 0630) Pulse Rate: 49 (03/25 0630)  Labs: Recent Labs    02/01/24 1811 02/01/24 1816 02/01/24 1816 02/02/24 0233 02/02/24 0658 02/02/24 1403 02/02/24 1957 02/03/24 0505  HGB 16.1 16.0  --  14.9  --   --  12.9* 12.1*  HCT 50.3 47.0  --  45.2  --   --  38.0* 37.5*  PLT 260  --   --  260  --   --   --  237  APTT 23*  --   --   --   --   --   --   --   LABPROT 13.3  --   --   --   --   --   --   --   INR 1.0  --   --   --   --   --   --   --   HEPARINUNFRC  --   --    < > 0.21* 0.39 0.66  --  >1.10*  CREATININE 0.98 1.00  --   --   --   --   --  0.79   < > = values in this interval not displayed.    Estimated Creatinine Clearance: 120 mL/min (by C-G formula based on SCr of 0.79 mg/dL).  Assessment: 33 y.o. male with dural sinus thrombosis s/p revascularization in IR 3/24 for heparin.  Received heparin 3000 units IV periprocedure  Goal of Therapy:  Heparin level 0.3-0.5 units/ml Monitor platelets by anticoagulation protocol: Yes   Plan:  Hold heparin x 1 hour, then decrease heparin 800 units/hr Check heparin level in 6 hours.   Geannie Risen, PharmD, BCPS   02/03/2024,6:44 AM

## 2024-02-03 NOTE — Progress Notes (Addendum)
 PHARMACY - ANTICOAGULATION  Pharmacy Consult for heparin Indication: cerebral venous sinus thrombosis   No Known Allergies  Patient Measurements: Height: 5\' 4"  (162.6 cm) Weight: 74.2 kg (163 lb 9.3 oz) IBW/kg (Calculated) : 59.2 Heparin Dosing Weight: 74.9  Vital Signs: Temp: 97.7 F (36.5 C) (03/25 1430) BP: 111/70 (03/25 1430) Pulse Rate: 51 (03/25 1430)  Labs: Recent Labs    02/01/24 1811 02/01/24 1816 02/02/24 0233 02/02/24 0658 02/02/24 1403 02/02/24 1957 02/03/24 0505 02/03/24 1325  HGB 16.1 16.0 14.9  --   --  12.9* 12.1*  --   HCT 50.3 47.0 45.2  --   --  38.0* 37.5*  --   PLT 260  --  260  --   --   --  237  --   APTT 23*  --   --   --   --   --   --   --   LABPROT 13.3  --   --   --   --   --   --   --   INR 1.0  --   --   --   --   --   --   --   HEPARINUNFRC  --   --  0.21*   < > 0.66  --  >1.10* 0.85*  CREATININE 0.98 1.00  --   --   --   --  0.79  --    < > = values in this interval not displayed.    Estimated Creatinine Clearance: 120 mL/min (by C-G formula based on SCr of 0.79 mg/dL).  Assessment: 33 y.o. male with dural sinus thrombosis s/p revascularization in IR 3/24 for heparin.  Received heparin 3000 units IV periprocedure and heparin restarted post procedure.   Heparin level supratherapeutic (0.85) on infusion at 800 units/hr. RN reports bleeding when sheath out this a.m. but pressure held and no bleeding since.  Goal of Therapy:  Heparin level 0.3-0.5 units/ml Monitor platelets by anticoagulation protocol: Yes   Plan:  Hold heparin x 30 min, then decrease heparin to 600 units/hr Check heparin level 6 hr post restart  Christoper Fabian, PharmD, BCPS Please see amion for complete clinical pharmacist phone list 02/03/2024,3:08 PM

## 2024-02-04 DIAGNOSIS — G08 Intracranial and intraspinal phlebitis and thrombophlebitis: Secondary | ICD-10-CM | POA: Diagnosis not present

## 2024-02-04 LAB — CBC
HCT: 34.9 % — ABNORMAL LOW (ref 39.0–52.0)
Hemoglobin: 11.1 g/dL — ABNORMAL LOW (ref 13.0–17.0)
MCH: 26.6 pg (ref 26.0–34.0)
MCHC: 31.8 g/dL (ref 30.0–36.0)
MCV: 83.7 fL (ref 80.0–100.0)
Platelets: 209 10*3/uL (ref 150–400)
RBC: 4.17 MIL/uL — ABNORMAL LOW (ref 4.22–5.81)
RDW: 12.8 % (ref 11.5–15.5)
WBC: 7.6 10*3/uL (ref 4.0–10.5)
nRBC: 0 % (ref 0.0–0.2)

## 2024-02-04 LAB — BASIC METABOLIC PANEL
Anion gap: 9 (ref 5–15)
BUN: 12 mg/dL (ref 6–20)
CO2: 21 mmol/L — ABNORMAL LOW (ref 22–32)
Calcium: 8.4 mg/dL — ABNORMAL LOW (ref 8.9–10.3)
Chloride: 111 mmol/L (ref 98–111)
Creatinine, Ser: 0.92 mg/dL (ref 0.61–1.24)
GFR, Estimated: >60 mL/min (ref >60)
Glucose, Bld: 96 mg/dL (ref 70–99)
Potassium: 3.4 mmol/L — ABNORMAL LOW (ref 3.5–5.1)
Sodium: 141 mmol/L (ref 135–145)

## 2024-02-04 LAB — MAGNESIUM: Magnesium: 2.1 mg/dL (ref 1.7–2.4)

## 2024-02-04 LAB — PHOSPHORUS: Phosphorus: 3.2 mg/dL (ref 2.5–4.6)

## 2024-02-04 LAB — HEPARIN LEVEL (UNFRACTIONATED): Heparin Unfractionated: 0.27 [IU]/mL — ABNORMAL LOW (ref 0.30–0.70)

## 2024-02-04 MED ORDER — APIXABAN 5 MG PO TABS: 5.0000 mg | ORAL_TABLET | Freq: Two times a day (BID) | ORAL | Status: DC

## 2024-02-04 MED ORDER — ENSURE ENLIVE PO LIQD
237.0000 mL | Freq: Two times a day (BID) | ORAL | Status: DC
  Administered 2024-02-04 – 2024-02-10 (×11): 237 mL via ORAL

## 2024-02-04 MED ORDER — POTASSIUM CHLORIDE CRYS ER 20 MEQ PO TBCR
40.0000 meq | EXTENDED_RELEASE_TABLET | Freq: Once | ORAL | Status: AC
  Administered 2024-02-04: 40 meq via ORAL
  Filled 2024-02-04: qty 2

## 2024-02-04 MED ORDER — OXYCODONE HCL 5 MG PO TABS
5.0000 mg | ORAL_TABLET | Freq: Four times a day (QID) | ORAL | Status: DC | PRN
  Administered 2024-02-04 – 2024-02-08 (×8): 5 mg via ORAL
  Filled 2024-02-04 (×8): qty 1

## 2024-02-04 MED ORDER — APIXABAN 5 MG PO TABS
10.0000 mg | ORAL_TABLET | Freq: Two times a day (BID) | ORAL | Status: AC
  Administered 2024-02-04 – 2024-02-10 (×14): 10 mg via ORAL
  Filled 2024-02-04 (×14): qty 2

## 2024-02-04 MED ORDER — SODIUM CHLORIDE 0.9 % IV SOLN
INTRAVENOUS | Status: AC
Start: 2024-02-04 — End: 2024-02-05

## 2024-02-04 NOTE — Plan of Care (Signed)
 Patient doing well today.  Eating and no neuro changes. Mild headache and noted prior on this admission.

## 2024-02-04 NOTE — Discharge Instructions (Addendum)
 Information on my medicine - ELIQUIS (apixaban)  This medication education was reviewed with me or my healthcare representative as part of my discharge preparation.   Why was Eliquis prescribed for you? Eliquis was prescribed to treat blood clots that may have been found in the veins of your legs (deep vein thrombosis) or in your lungs (pulmonary embolism) and to reduce the risk of them occurring again.  What do You need to know about Eliquis ? The starting dose is 10 mg (two 5 mg tablets) taken TWICE daily for the FIRST SEVEN (7) DAYS, then on 02/11/24  the dose is reduced to ONE 5 mg tablet taken TWICE daily.  Eliquis may be taken with or without food.   Try to take the dose about the same time in the morning and in the evening. If you have difficulty swallowing the tablet whole please discuss with your pharmacist how to take the medication safely.  Take Eliquis exactly as prescribed and DO NOT stop taking Eliquis without talking to the doctor who prescribed the medication.  Stopping may increase your risk of developing a new blood clot.  Refill your prescription before you run out.  After discharge, you should have regular check-up appointments with your healthcare provider that is prescribing your Eliquis.    What do you do if you miss a dose? If a dose of ELIQUIS is not taken at the scheduled time, take it as soon as possible on the same day and twice-daily administration should be resumed. The dose should not be doubled to make up for a missed dose.  Important Safety Information A possible side effect of Eliquis is bleeding. You should call your healthcare provider right away if you experience any of the following: Bleeding from an injury or your nose that does not stop. Unusual colored urine (red or dark brown) or unusual colored stools (red or black). Unusual bruising for unknown reasons. A serious fall or if you hit your head (even if there is no bleeding).  Some medicines  may interact with Eliquis and might increase your risk of bleeding or clotting while on Eliquis. To help avoid this, consult your healthcare provider or pharmacist prior to using any new prescription or non-prescription medications, including herbals, vitamins, non-steroidal anti-inflammatory drugs (NSAIDs) and supplements.  This website has more information on Eliquis (apixaban): http://www.eliquis.com/eliquis/home

## 2024-02-04 NOTE — Progress Notes (Addendum)
 NAME:  Samuel Warren, MRN:  [id number], DOB:  [birth year], LOS: 3 ADMISSION DATE:  02/01/2024, CONSULTATION DATE:  02/01/24 REFERRING MD: neuro, CHIEF COMPLAINT:  R weakness   History of Present Illness:  33 yo male with recent hospital admission and discharge 2/2 vasculitis and meningeal encephalitis req doxy after recent wild boar hunting trip per chart review and brother at bedside. Pt is unable to provide history 2/2 R UE deficits at this time and hearing impairment requiring him to utilize ASL but unfortunately at this time unable to. Pt appears comfortable but is much weaker on R upper and lower rather than L.   Per brother pt was discharged 2-3 days ago on doxy after being found to have meningitis. His mother called him and stated he was not doing well and needed to go to hospital. She described he was unable to move or respond (per the brother). She states he was last known normal at 1100.  He also reportedly complained of HA, N/V. Pt's family ultimately called EMS and code stroke initiated. CTH in ed revealed multiple venous thrombi without acute hypodensity. Neuro was called for admission and due to the lack of acute findings for cva but need for neuro checks q1hr ccm was asked for admission.     Pertinent  Medical History  Meningoencephalitis Hearing impairment   Significant Hospital Events: Including procedures, antibiotic start and stop dates in addition to other pertinent events   Readmitted to ICU 3/23 3/24 Left common carotid carotid arteriogram and transverse sinus sigmoid sinus with occluded posterior half of superior sagittal sinus and occluded dominant left transverse sinus, left sigmoid sinus and proximal internal jugular vein. 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  Interim History / Subjective:  Extubated yesterday afternoon, tolerated well. NAEON.  Objective   Blood pressure 105/72, pulse (!) 48, temperature 98.4  F (36.9 C), temperature source Oral, resp. rate 11, height 5\' 4"  (1.626 m), weight 74.2 kg, SpO2 98%.    Vent Mode: PSV;CPAP FiO2 (%):  [30 %] 30 % PEEP:  [5 cmH20] 5 cmH20 Pressure Support:  [5 cmH20] 5 cmH20   Intake/Output Summary (Last 24 hours) at 02/04/2024 7829 Last data filed at 02/04/2024 0700 Gross per 24 hour  Intake 1326.64 ml  Output 2375 ml  Net -1048.36 ml   Filed Weights   02/02/24 0500 02/03/24 0500 02/04/24 0500  Weight: 68.9 kg 74.2 kg 74.2 kg    Physical Exam: General: Adult male, NAD HENT: [place], AT, EOMI Eyes: EOMI, no scleral icterus Respiratory: Clear to auscultation bilaterally.  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 3/24 S/p revascularization of left internal jugular, left sigmoid sinus and left transverse sinus. Superior sagittal sinus remained occluded. - Neuro and Neuro IR following. - Neuro checks. - Heparin gtt per Neuro. - Transition to Eliquis. - Will need PT/OT.  Hypokalemia. - K PO x 1.  Deaf and communicates via [place] sign language. - Call [phone] M-F [phone], [phone] for after hours pager.  Constipation - resolved, BM overnight 3/25. - Colace, Dulcolax.  Stable from CCM standpoint, ok to transfer out of ICU (discussed with neuro). Will ask TRH to assume care in AM 3/27 with PCCM off.   Best Practice (right click and "Reselect all SmartList Selections" daily)  Diet/type: Regular consistency (see orders) DVT prophylaxis systemic heparin Pressure ulcer(s): N/A GI prophylaxis: N/A Lines: N/A Foley:  N/A Code Status:  full code Last date of multidisciplinary goals of care discussion [per brother at bedside. ]   [name], PA - [facility] For pager details, please see AMION or use Epic chat  After 1900, please call [facility] for cross coverage needs 02/04/2024, 9:18 AM

## 2024-02-04 NOTE — Progress Notes (Signed)
 PHARMACY - ANTICOAGULATION  Pharmacy Consult for heparin Indication: cerebral venous sinus thrombosis   No Known Allergies  Patient Measurements: Height: 5\' 4"  (162.6 cm) Weight: 74.2 kg (163 lb 9.3 oz) IBW/kg (Calculated) : 59.2 Heparin Dosing Weight: 74.9  Vital Signs: Temp: 98.4 F (36.9 C) (03/26 0000) Temp Source: Oral (03/26 0000) BP: 107/65 (03/26 0700) Pulse Rate: 47 (03/26 0700)  Labs: Recent Labs    02/01/24 1811 02/01/24 1816 02/02/24 0233 02/02/24 0658 02/02/24 1957 02/03/24 0505 02/03/24 1325 02/03/24 2157 02/04/24 0609  HGB 16.1 16.0 14.9  --  12.9* 12.1*  --   --  11.1*  HCT 50.3 47.0 45.2  --  38.0* 37.5*  --   --  34.9*  PLT 260  --  260  --   --  237  --   --  209  APTT 23*  --   --   --   --   --   --   --   --   LABPROT 13.3  --   --   --   --   --   --   --   --   INR 1.0  --   --   --   --   --   --   --   --   HEPARINUNFRC  --   --  0.21*   < >  --  >1.10* 0.85* <0.10* 0.27*  CREATININE 0.98 1.00  --   --   --  0.79  --   --  0.92   < > = values in this interval not displayed.    Estimated Creatinine Clearance: 104.3 mL/min (by C-G formula based on SCr of 0.92 mg/dL).  Assessment: 33 y.o. male with dural sinus thrombosis s/p revascularization in IR 3/24 for heparin.  Received heparin 3000 units IV periprocedure and heparin restarted post procedure.   HL 0.27 - slightly subtherapeutic  Goal of Therapy:  Heparin level 0.3-0.5 units/ml Monitor platelets by anticoagulation protocol: Yes   Plan:  Increase heparin to 750 units/hr Check heparin level in 6 hours  Calton Dach, PharmD, [facility] Clinical Pharmacist 02/04/2024 7:16 AM

## 2024-02-04 NOTE — Progress Notes (Addendum)
 STROKE TEAM PROGRESS NOTE   INTERIM HISTORY/SUBJECTIVE recent admission to the hospital from 3/16-3/20 for acute meningoencephalitis which was treated with antibiotics p/w dizzy, with headache, nausea and also some vomiting  CT head reveals multiple thrombosed cerebral veins including the SSS, straight sinus, inferior sagittal sinus and both transverse sinuses left worse than right. No acute hypodensity is seen  CTA of head and neck with CTP: Diffuse dural sinus thrombosis  CTV: Extensive dural sinus thrombus within the posterior aspect of the superior sagittal sinus, the straight sinus, the inferior sagittal sinus and left greater than right transverse sinus. Thrombus extends into the left sigmoid sinus and visualized left internal jugular vein. Innumerable enlarged and tortuous cortical veins are visualized, consistent with collateral venous drainage. Enlarged left petrosal veins are present in the middle cranial fossa.   This morning patient is extubated and doing well.  He is awake and arousable.  He follows simple gestures and moves all 4 extremities well against gravity.  He is has done well postextubation and will be transferred to the floor later today  OBJECTIVE  CBC    Component Value Date/Time   WBC 7.6 02/04/2024 0609   RBC 4.17 (L) 02/04/2024 0609   HGB 11.1 (L) 02/04/2024 0609   HCT 34.9 (L) 02/04/2024 0609   PLT 209 02/04/2024 0609   MCV 83.7 02/04/2024 0609   MCH 26.6 02/04/2024 0609   MCHC 31.8 02/04/2024 0609   RDW 12.8 02/04/2024 0609   LYMPHSABS 1.1 02/03/2024 0505   MONOABS 0.8 02/03/2024 0505   EOSABS 0.0 02/03/2024 0505   BASOSABS 0.0 02/03/2024 0505    BMET    Component Value Date/Time   NA 141 02/04/2024 0609   K 3.4 (L) 02/04/2024 0609   CL 111 02/04/2024 0609   CO2 21 (L) 02/04/2024 0609   GLUCOSE 96 02/04/2024 0609   BUN 12 02/04/2024 0609   CREATININE 0.92 02/04/2024 0609   CALCIUM 8.4 (L) 02/04/2024 0609   GFRNONAA >60 02/04/2024 0609     IMAGING past 24 hours No results found.   Vitals:   02/04/24 0900 02/04/24 1000 02/04/24 1100 02/04/24 1244  BP: 108/78 114/74 103/64 115/67  Pulse: (!) 58 (!) 52 66 (!) 56  Resp: 19 (!) 0 14 16  Temp:    98.1 F (36.7 C)  TempSrc:    Oral  SpO2: 99% 98% 98% 100%  Weight:      Height:         PHYSICAL EXAM General: Patient is sedated and intubated psych:  Mood and affect appropriate for situation CV: Regular rate and rhythm on monitor Respiratory:  Regular, unlabored respirations on room air GI: Abdomen soft and nontender   NEURO:  Mental Status: Awake alert speech/Language: deaf   Cranial Nerves:  II: PERRL. Visual fields unable to test III, IV, VI: EOMI. doll's eye movements are present V: Sensation is intact to light touch and symmetrical to face.  VII: Face is symmetrical   VIII: hearing intact to voice. IX, X: Palate cough and gag present WU:[id number] shrug unable to test XII: tongue is midline without fasciculations. Motor: Withdraws bilateral upper and right lower extremities well.  Trace withdrawal in the left lower extremity to painful stimulus  tone: is normal and bulk is normal Sensation- Intact to light touch bilaterally. Extinction unable to test coordination: FTN intact unable to test gait- deferred   ASSESSMENT/PLAN  Samuel Warren is a 33 y.o. 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  NIH on Admission 4  CVST  in  SSS, straight sinus, inferior sagittal sinus and both transverse sinuses  Etiology:  due to delayed complication of recent viral meningoencephalitis  Code Stroke CT head multiple thrombosed cerebral veins including the SSS, straight sinus, inferior sagittal sinus and both transverse sinuses left worse than right. No acute hypodensity is seen  CTA head & neck  Diffuse  dural sinus thrombosis  CTV Extensive dural sinus thrombus within the posterior aspect of the superior sagittal sinus, the straight sinus, the inferior sagittal sinus and left greater than right transverse sinus. Thrombus extends into the left sigmoid sinus and visualized left internal jugular vein. Innumerable enlarged and tortuous cortical veins are visualized, consistent with collateral venous drainage. Enlarged left petrosal veins are present in the middle cranial fossa Repeat CT head Diffuse hyperdensity throughout the major dural sinuses.  ssociated cerebral edema with diffuse loss of cortical sulcation and 7 mm of cerebellar tonsillar ectopia, stable. 2D Echo 3/18 EF 45 to 50%. Apical hypokinesis with overall mild LV dysfunction  LDL 279 HgbA1c 5.7 VTE prophylaxis -for an IV No antithrombotic prior to admission, now on heparin IV will need transition to Eliquis in 3 to 5 days Therapy recommendations:  Pending Disposition:  pending  Hx of Stroke/TIA Recent viral meningitis January 27, 2024 right frontal 2-3 punctate infarcts, etiology: Likely due to infectious vasculitis vs. Small vessel disease in the setting of CNS infection  Completed course of Doxy  Hyperlipidemia Home meds: Atorvastatin 80,  resumed in hospital LDL 279, goal < 70 Continue statin at discharge  Dysphagia Patient has post-stroke dysphagia, SLP consulted    Diet   Diet regular Room service appropriate? Yes with Assist; Fluid consistency: Thin   Advance diet as tolerated   Other Active Problems   Hospital day # 3 Patient went for mechanical thrombectomy yesterday but only part of the clot was successfully removed.  Follow-up CT scan shows stable appearance.  Switch IV heparin to Eliquis if patient is able to swallow safely.   Transfer out of ICU to neurology floor bed today.  Medical hospitalist team to resume care.  Discussed with Dr. Everardo All critical care medicine.  Greater than 50% time during this 35-minute  visit was spent in counseling coordination of care and discussion patient care team and answering questions.        Delia Heady, MD Medical Director [facility] Pager: [phone] 02/04/2024 3:08 PM   To contact Stroke Continuity provider, please refer to [email or web address]. After hours, contact General Neurology

## 2024-02-04 NOTE — Progress Notes (Addendum)
 PHARMACY - ANTICOAGULATION  Pharmacy Consult for heparin>> eliquis Indication: cerebral venous sinus thrombosis   No Known Allergies  Patient Measurements: Height: 5\' 4"  (162.6 cm) Weight: 74.2 kg (163 lb 9.3 oz) IBW/kg (Calculated) : 59.2 Heparin Dosing Weight: 74.9  Vital Signs: Temp: 98.5 F (36.9 C) (03/26 0800) Temp Source: Axillary (03/26 0800) BP: 114/74 (03/26 1000) Pulse Rate: 52 (03/26 1000)  Labs: Recent Labs    02/01/24 1811 02/01/24 1816 02/02/24 0233 02/02/24 0658 02/02/24 1957 02/03/24 0505 02/03/24 1325 02/03/24 2157 02/04/24 0609  HGB 16.1 16.0 14.9  --  12.9* 12.1*  --   --  11.1*  HCT 50.3 47.0 45.2  --  38.0* 37.5*  --   --  34.9*  PLT 260  --  260  --   --  237  --   --  209  APTT 23*  --   --   --   --   --   --   --   --   LABPROT 13.3  --   --   --   --   --   --   --   --   INR 1.0  --   --   --   --   --   --   --   --   HEPARINUNFRC  --   --  0.21*   < >  --  >1.10* 0.85* <0.10* 0.27*  CREATININE 0.98 1.00  --   --   --  0.79  --   --  0.92   < > = values in this interval not displayed.    Estimated Creatinine Clearance: 104.3 mL/min (by C-G formula based on SCr of 0.92 mg/dL).  Assessment: 33 y.o. male with dural sinus thrombosis s/p revascularization in IR 3/24 for heparin.  Received heparin 3000 units IV periprocedure and heparin restarted post procedure.   HL 0.27 - slightly subtherapeutic  Goal of Therapy:  Heparin level 0.3-0.5 units/ml Monitor platelets by anticoagulation protocol: Yes   Plan:  Increase heparin to 750 units/hr Check heparin level in 6 hours  Calton Dach, PharmD, BCCCP Clinical Pharmacist   **ADDENDUM**  Pharmacy consulted to transition from heparin to Eliquis. Patient was not therapeutic for majority of heparin duration and has extensive clot burden.  Plan:  Stop heparin infusion Start Eliquis 10mg  PO BID for 7 days followed by 5mg  PO BID Monitor CBC and for signs/symptoms of  bleeding   Stephenie Acres, PharmD PGY1 Pharmacy Resident 02/04/2024 10:18 AM

## 2024-02-05 ENCOUNTER — Encounter (HOSPITAL_COMMUNITY): Payer: Self-pay

## 2024-02-05 DIAGNOSIS — G08 Intracranial and intraspinal phlebitis and thrombophlebitis: Secondary | ICD-10-CM | POA: Diagnosis not present

## 2024-02-05 LAB — BASIC METABOLIC PANEL WITH GFR
Anion gap: 6 (ref 5–15)
BUN: 10 mg/dL (ref 6–20)
CO2: 24 mmol/L (ref 22–32)
Calcium: 8.5 mg/dL — ABNORMAL LOW (ref 8.9–10.3)
Chloride: 109 mmol/L (ref 98–111)
Creatinine, Ser: 0.89 mg/dL (ref 0.61–1.24)
GFR, Estimated: >60 mL/min (ref >60)
Glucose, Bld: 102 mg/dL — ABNORMAL HIGH (ref 70–99)
Potassium: 3.5 mmol/L (ref 3.5–5.1)
Sodium: 139 mmol/L (ref 135–145)

## 2024-02-05 LAB — CBC
HCT: 35.2 % — ABNORMAL LOW (ref 39.0–52.0)
Hemoglobin: 11.6 g/dL — ABNORMAL LOW (ref 13.0–17.0)
MCH: 27.4 pg (ref 26.0–34.0)
MCHC: 33 g/dL (ref 30.0–36.0)
MCV: 83 fL (ref 80.0–100.0)
Platelets: 217 10*3/uL (ref 150–400)
RBC: 4.24 MIL/uL (ref 4.22–5.81)
RDW: 12.7 % (ref 11.5–15.5)
WBC: 6.4 10*3/uL (ref 4.0–10.5)
nRBC: 0 % (ref 0.0–0.2)

## 2024-02-05 LAB — MAGNESIUM: Magnesium: 1.9 mg/dL (ref 1.7–2.4)

## 2024-02-05 LAB — POCT ACTIVATED CLOTTING TIME: Activated Clotting Time: 245 s

## 2024-02-05 LAB — PHOSPHORUS: Phosphorus: 3 mg/dL (ref 2.5–4.6)

## 2024-02-05 MED ORDER — ADULT MULTIVITAMIN W/MINERALS CH
1.0000 | ORAL_TABLET | Freq: Every day | ORAL | Status: DC
  Administered 2024-02-05 – 2024-02-10 (×6): 1 via ORAL
  Filled 2024-02-05 (×5): qty 1

## 2024-02-05 MED ORDER — ADULT MULTIVITAMIN W/MINERALS CH
1.0000 | ORAL_TABLET | Freq: Every day | ORAL | Status: DC
  Filled 2024-02-05: qty 1

## 2024-02-05 MED ORDER — ATORVASTATIN CALCIUM 80 MG PO TABS
80.0000 mg | ORAL_TABLET | Freq: Every day | ORAL | Status: DC
  Administered 2024-02-05 – 2024-02-10 (×6): 80 mg via ORAL
  Filled 2024-02-05 (×7): qty 1

## 2024-02-05 NOTE — Evaluation (Signed)
 Occupational Therapy Evaluation Patient Details Name: Samuel Warren MRN: [id number] DOB: 1991-01-12 Today's Date: 02/05/2024   History of Present Illness   Pt admitted 3/23 with right sided weakness.  CT head revealed multiple thrombosed cerebral veins including the SSS, straight sinus, inferior sagittal sinus and both transverse sinuses left worse than right.  Thrombus extended into the left sigmoid sinus and visualized left internal jugular vein. Innumerable enlarged and tortuous cortical veins are visualized, consistent with collateral venous drainage. Enlarged left petrosal veins are present in the middle cranial fossa.  Pt underwent mechanical thrombectomy for clot extraction by Dr. Corliss Skains 3/24.  He was able to open up left transverse and sigmoid sinuses but the clot was quite thick and he was unable to the superior sagittal sinus and transverse and sigmoid sinus on the right.  PMH: deaf, recent admission to the hospital from 3/16-3/20 for acute meningoencephalitis which was treated with antibiotics, TIA, hyperlipidemia     Clinical Impressions PTA pt lives independently with his wife and mother/father/extended family and works at the [employer]. Difficulty with interpreter therefore brother assisted via phone with pt's mother in the room to communicate with her son. Per OT who is familiar with pt from last admission, pt is able to read english, however attempted this and pt did not appear to understand. Through conversation with pt's brother, Donnie has not been able to "really walk" since being home and has required physical assistance with mobility and ADL tasks due to weakness and dizziness. Assisted PT with pt as pt was requiring emergent assistance to sit to prevent fall. As noted below, Pt is currently functioning below his baseline level and feel he benefit from intensive inpatient follow-up therapy, >3 hours/day to maximize functional level of independence and facilitate safe  DC home with family. Acute OT to follow.      If plan is discharge home, recommend the following:   A lot of help with walking and/or transfers;A little help with bathing/dressing/bathroom;Assistance with cooking/housework;Assist for transportation;Help with stairs or ramp for entrance     Functional Status Assessment   Patient has had a recent decline in their functional status and demonstrates the ability to make significant improvements in function in a reasonable and predictable amount of time.     Equipment Recommendations         Recommendations for Other Services   Rehab consult     Precautions/Restrictions   Precautions Precautions: Fall     Mobility Bed Mobility Overal bed mobility: Needs Assistance Bed Mobility: Supine to Sit, Sit to Supine     Supine to sit: Supervision Sit to supine: Supervision   General bed mobility comments: no physical assist needed    Transfers Overall transfer level: Needs assistance Equipment used: None Transfers: Sit to/from Stand Sit to Stand: Min assist           General transfer comment: Pt able to stand to feet with just CGA initially.  Once fatigued, needed min assist for stand to sit as right LE fatigued vs. in pain.      Balance Overall balance assessment: Needs assistance Sitting-balance support: No upper extremity supported, Feet supported Sitting balance-Leahy Scale: Good     Standing balance support: Single extremity supported, During functional activity Standing balance-Leahy Scale: Poor Standing balance comment: relies on 1 UE support and external support                           ADL  either performed or assessed with clinical judgement   ADL Overall ADL's : Needs assistance/impaired     Grooming: Set up;Supervision/safety;Sitting   Upper Body Bathing: Supervision/ safety;Set up;Sitting   Lower Body Bathing: Minimal assistance;Sit to/from stand   Upper Body Dressing : Minimal  assistance;Sitting   Lower Body Dressing: Sit to/from stand;Minimal assistance   Toilet Transfer: Moderate assistance   Toileting- Clothing Manipulation and Hygiene: Minimal assistance       Functional mobility during ADLs: Moderate assistance (AT TIMES)       Vision Patient Visual Report:  (will further assess) Additional Comments: conjugate gaze; looks at all fields; conjugate gaze; will further assess     Perception         Praxis         Pertinent Vitals/Pain Pain Assessment Pain Assessment: Faces Faces Pain Scale: Hurts even more Pain Location: right LE Pain Descriptors / Indicators: Discomfort Pain Intervention(s): Limited activity within patient's tolerance     Extremity/Trunk Assessment Upper Extremity Assessment Upper Extremity Assessment: Right hand dominant RUE Deficits / Details: appears WFL however appears to be complaining of numbness; will further assess   Lower Extremity Assessment Lower Extremity Assessment: Defer to PT evaluation RLE Deficits / Details: overall moving funcitonally; unsure if numb as pt pointing to RUE   Cervical / Trunk Assessment Cervical / Trunk Assessment: Normal   Communication Communication Communication: Impaired Factors Affecting Communication: Hearing impaired   Cognition Arousal: Alert Behavior During Therapy: WFL for tasks assessed/performed Cognition: Difficult to assess Difficult to assess due to: Non-English speaking                               Following commands impaired:  (difficult to assess)     Cueing  General Comments   Cueing Techniques: Gestural cues;Tactile cues;Visual cues      Exercises     Shoulder Instructions      Home Living Family/patient expects to be discharged to:: Private residence Living Arrangements: Parent;Other relatives Available Help at Discharge: Family;Available 24 hours/day (wife can help and parents) Type of Home: House Home Access: Stairs to  enter [employer] of Steps: 7 Entrance Stairs-Rails: Left;Right Home Layout: One level     Bathroom Shower/Tub: Chief Strategy Officer: Standard     Home Equipment: None   Additional Comments: lives with parents and siblings, 8 people total. 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 Does not drive but father does. Independent ADLs Comments: independent    OT Problem List: Impaired balance (sitting and/or standing);Decreased safety awareness;Impaired sensation   OT Treatment/Interventions: Self-care/ADL training;Therapeutic exercise;Neuromuscular education;DME and/or AE instruction;Therapeutic activities;Patient/family education;Balance training      OT Goals(Current goals can be found in the care plan section)   Acute Rehab OT Goals Patient Stated Goal: REHAB OT Goal Formulation: With patient/family Time For Goal Achievement: 02/19/24 Potential to Achieve Goals: Good   OT Frequency:  Min 2X/week    Co-evaluation PT/OT/SLP Co-Evaluation/Treatment: Yes     OT goals addressed during session: ADL's and self-care      AM-PAC OT "6 Clicks" Daily Activity     Outcome Measure Help from another person eating meals?:  None Help from another person taking care of personal grooming?: A Little Help from another person toileting, which includes using toliet, bedpan, or urinal?: A Little Help from another person bathing (including washing, rinsing, drying)?: A Little Help from another person to put on and taking off regular upper body clothing?: A Little Help from another person to put on and taking off regular lower body clothing?: A Lot 6 Click Score: 18   End of Session Equipment Utilized During Treatment:  Gait belt Nurse Communication: Mobility status  Activity Tolerance: Patient tolerated treatment well Patient left: in bed;with call bell/phone within reach;with bed alarm set;with family/visitor present  OT Visit Diagnosis: Unsteadiness on feet (R26.81);Repeated falls (R29.6) Pain - part of body:  (UNSURE)                Time: 1045-1105 OT Time Calculation (min): 20 min Charges:  OT General Charges $OT Visit: 1 Visit OT Evaluation $OT Eval Moderate Complexity: 1 Mod  Kazuo Durnil, OT/L   Acute OT Clinical Specialist Acute Rehabilitation Services Pager [phone] Office [phone]   [facility] 02/05/2024, 4:30 PM

## 2024-02-05 NOTE — Progress Notes (Signed)
 Inpatient Rehab Admissions Coordinator:  Consult received. Sign language interpreter not available at this time. Will have colleague Vernona Rieger follow up tomorrow.   Wolfgang Phoenix, MS, CCC-SLP Admissions Coordinator [phone]

## 2024-02-05 NOTE — Progress Notes (Signed)
  Inpatient Rehab Admissions Coordinator :  Per therapy recommendations, patient was screened for CIR candidacy by Ottie Glazier RN MSN.  At this time patient appears to be a potential candidate for CIR. I will place a rehab consult per protocol for full assessment. Please call me with any questions.  Ottie Glazier RN MSN Admissions Coordinator [phone]

## 2024-02-05 NOTE — Progress Notes (Addendum)
 Nutrition Follow-up  DOCUMENTATION CODES:  Not applicable  INTERVENTION:  Continue regular diet Ensure Enlive po BID, each supplement provides 350 kcal and 20 grams of protein. MVI with minerals daily  NUTRITION DIAGNOSIS:  Inadequate oral intake related to inability to eat as evidenced by NPO status. - remains applicable  GOAL:  Patient will meet greater than or equal to 90% of their needs - progressing, diet advanced  MONITOR:  PO intake, Labs, Weight trends  REASON FOR ASSESSMENT:  Ventilator   ASSESSMENT:  Pt with hx of recent admission for meningitis admitted for acute cerebral venous thrombosis. Pt hearing impaired and requires Jarai sign language interpreter.  3/23 admitted to ICU;  3/24 intubated and sedated; OR for vascularization of L internal jugular, L sigmoid sinus and transverse sinus; continued occlusion of sagittal sinus  3/25 - extubated  Pt sleeping at the time of assessment. Family member at bedside awake and indicates that pt is eating well. Empty ensure at bedside. Pt drinking and accepting supplements.    Admit weight: 77 kg  Current weight: 74.2 kg   Average Meal Intake: 3/26: 100% intake x 2 recorded meals  Nutritionally Relevant Medications: Scheduled Meds:  atorvastatin  80 mg Oral Daily   Ensure Enlive  237 mL Oral BID BM   PRN Meds: bisacodyl, docusate sodium, polyethylene glycol  Labs Reviewed  NUTRITION - FOCUSED PHYSICAL EXAM: Flowsheet Row Most Recent Value  Orbital Region No depletion  Upper Arm Region No depletion  Thoracic and Lumbar Region No depletion  Buccal Region Unable to assess  Temple Region No depletion  Clavicle Bone Region No depletion  Clavicle and Acromion Bone Region No depletion  Scapular Bone Region No depletion  Dorsal Hand Unable to assess  [mittens]  Patellar Region No depletion  Anterior Thigh Region No depletion  Posterior Calf Region No depletion  Edema (RD Assessment) None  Hair Reviewed  Eyes  Unable to assess  Mouth Unable to assess  Skin Reviewed  [bruising on arms]  Nails Unable to assess   Diet Order:   Diet Order             Diet regular Room service appropriate? Yes with Assist; Fluid consistency: Thin  Diet effective now                  EDUCATION NEEDS:  Not appropriate for education at this time  Skin:  Skin Assessment: Reviewed RN Assessment  Last BM:  3/25 - type 3  Height:  Ht Readings from Last 1 Encounters:  02/01/24 5\' 4"  (1.626 m)    Weight:  Wt Readings from Last 1 Encounters:  02/04/24 74.2 kg    Ideal Body Weight:  59 kg  BMI:  Body mass index is 28.08 kg/m.  Estimated Nutritional Needs:  Kcal:  1900-2100 Protein:  90-110g Fluid:  >2L   Greig Castilla, RD, LDN Registered Dietitian II Please reach out via secure chat

## 2024-02-05 NOTE — Progress Notes (Signed)
 STROKE TEAM PROGRESS NOTE   INTERIM HISTORY/SUBJECTIVE Patient is now transferred to the floor.  Lady family members at the bedside.  He seems comfortable.  He follows commands pleasantly using sign language.  No sign language or [place] language interpreter available at the bedside. OBJECTIVE  CBC    Component Value Date/Time   WBC 6.4 02/05/2024 0628   RBC 4.24 02/05/2024 0628   HGB 11.6 (L) 02/05/2024 0628   HCT 35.2 (L) 02/05/2024 0628   PLT 217 02/05/2024 0628   MCV 83.0 02/05/2024 0628   MCH 27.4 02/05/2024 0628   MCHC 33.0 02/05/2024 0628   RDW 12.7 02/05/2024 0628   LYMPHSABS 1.1 02/03/2024 0505   MONOABS 0.8 02/03/2024 0505   EOSABS 0.0 02/03/2024 0505   BASOSABS 0.0 02/03/2024 0505    BMET    Component Value Date/Time   NA 139 02/05/2024 0628   K 3.5 02/05/2024 0628   CL 109 02/05/2024 0628   CO2 24 02/05/2024 0628   GLUCOSE 102 (H) 02/05/2024 0628   BUN 10 02/05/2024 0628   CREATININE 0.89 02/05/2024 0628   CALCIUM 8.5 (L) 02/05/2024 0628   GFRNONAA >60 02/05/2024 4098    IMAGING past 24 hours No results found.   Vitals:   02/04/24 2326 02/05/24 0331 02/05/24 0748 02/05/24 1114  BP: 101/72 107/72 120/79 113/73  Pulse: (!) 55 (!) 53  (!) 58  Resp: 15 16 16 18   Temp: 98.5 F (36.9 C) 98.5 F (36.9 C) 98.4 F (36.9 C) 98.7 F (37.1 C)  TempSrc:   Oral Oral  SpO2: 99% 98% 99% 99%  Weight:      Height:         PHYSICAL EXAM General: Patient is sedated and intubated psych:  Mood and affect appropriate for situation CV: Regular rate and rhythm on monitor Respiratory:  Regular, unlabored respirations on room air GI: Abdomen soft and nontender   NEURO:  Mental Status: Awake alert speech/Language: deaf   Cranial Nerves:  II: PERRL. Visual fields unable to test III, IV, VI: EOMI. doll's eye movements are present V: Sensation is intact to light touch and symmetrical to face.  VII: Face is symmetrical   VIII: hearing intact to voice. IX, X:  Palate cough and gag present JX:[id number] shrug unable to test XII: tongue is midline without fasciculations. Motor: Withdraws bilateral upper and right lower extremities well.  Trace withdrawal in the left lower extremity to painful stimulus  tone: is normal and bulk is normal Sensation- Intact to light touch bilaterally. Extinction unable to test coordination: FTN intact unable to test gait- deferred   ASSESSMENT/PLAN  Samuel Warren is a 33 y.o. 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  NIH on Admission 4  CVST  in  SSS, straight sinus, inferior sagittal sinus and both transverse sinuses  Etiology:  due to delayed complication of recent viral meningoencephalitis  Code Stroke CT head multiple thrombosed cerebral veins including the SSS, straight sinus, inferior sagittal sinus and both transverse sinuses left worse than right. No acute hypodensity is seen  CTA head & neck  Diffuse dural sinus thrombosis  CTV Extensive dural sinus thrombus within the posterior aspect of the superior sagittal sinus, the straight sinus, the inferior sagittal sinus and left greater than right transverse sinus. Thrombus extends into the left sigmoid sinus and visualized  left internal jugular vein. Innumerable enlarged and tortuous cortical veins are visualized, consistent with collateral venous drainage. Enlarged left petrosal veins are present in the middle cranial fossa Repeat CT head Diffuse hyperdensity throughout the major dural sinuses.  ssociated cerebral edema with diffuse loss of cortical sulcation and 7 mm of cerebellar tonsillar ectopia, stable. 2D Echo 3/18 EF 45 to 50%. Apical hypokinesis with overall mild LV dysfunction  LDL 279 HgbA1c 5.7 VTE prophylaxis -for an IV No antithrombotic prior to admission, now on heparin IV will need  transition to Eliquis in 3 to 5 days Therapy recommendations:  Pending Disposition:  pending  Hx of Stroke/TIA Recent viral meningitis January 27, 2024 right frontal 2-3 punctate infarcts, etiology: Likely due to infectious vasculitis vs. Small vessel disease in the setting of CNS infection  Completed course of Doxy  Hyperlipidemia Home meds: Atorvastatin 80,  resumed in hospital LDL 279, goal < 70 Continue statin at discharge  Dysphagia Patient has post-stroke dysphagia, SLP consulted    Diet   Diet regular Room service appropriate? Yes with Assist; Fluid consistency: Thin   Advance diet as tolerated   Other Active Problems   Hospital day # 4 Patient appears neurologically stable.  Continue aggressive hydration and Eliquis anticoagulation for at least 6 to 9 months.  Follow-up as an outpatient in stroke clinic.  Will need repeat CT venogram in 2 to 3 months..  Stroke team will sign off.  Kindly call for questions.  Discussed with Dr.Pahwani     Greater than 50% time during this 35-minute visit was spent in counseling coordination of care and discussion patient care team and answering questions.        Delia Heady, MD Medical Director [facility] Pager: [phone] 02/05/2024 2:04 PM   To contact Stroke Continuity provider, please refer to [email or web address]. After hours, contact General Neurology

## 2024-02-05 NOTE — Progress Notes (Signed)
 PROGRESS NOTE    Samuel Warren  NFA:[id number] DOB: 04-28-1991 DOA: 02/01/2024 PCP: Pcp, No   Brief Narrative:  Samuel Warren is a 33 y.o. 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   Reportedly, patient was discharged 2-3 days ago on doxy after being found to have meningitis. His mother called him and stated he was not doing well and needed to go to hospital. She described he was unable to move or respond (per the brother).  He also reportedly complained of HA, N/V. Pt's family ultimately called EMS and code stroke initiated.  He was afebrile in the ED.  CTH in ed revealed multiple venous thrombi without acute hypodensity.  Admitted in ICU under critical care, neuroconsulted.   Significant Hospital Events: Including procedures, antibiotic start and stop dates in addition to other pertinent events   Readmitted to ICU 3/23 3/24 Left common carotid carotid arteriogram and transverse sinus sigmoid sinus with occluded posterior half of superior sagittal sinus and occluded dominant left transverse sinus, left sigmoid sinus and proximal internal jugular vein. 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  Assessment & Plan:   Principal Problem:   Acute cerebral venous sinus thrombosis Active Problems:   Dural venous sinus thrombosis  Acute cerebral venous thrombosis - likely 2/2 meningoencephalitis. Code Stroke CT head multiple thrombosed cerebral veins including the SSS, straight sinus, inferior sagittal sinus and both transverse sinuses left worse than right. No acute hypodensity is seen  CTA head & neck  Diffuse dural sinus thrombosis  CTV Extensive dural sinus thrombus within the posterior aspect of the superior sagittal sinus, the straight sinus, the  inferior sagittal sinus and left greater than right transverse sinus. Thrombus extends into the left sigmoid sinus and visualized left internal jugular vein. Innumerable enlarged and tortuous cortical veins are visualized, consistent with collateral venous drainage. Enlarged left petrosal veins are present in the middle cranial fossa Repeat CT head Diffuse hyperdensity throughout the major dural sinuses.  ssociated cerebral edema with diffuse loss of cortical sulcation and 7 mm of cerebellar tonsillar ectopia, stable. 2D Echo 3/18 EF 45 to 50%. Apical hypokinesis with overall mild LV dysfunction  LDL 279 HgbA1c 5.7 VTE prophylaxis -for an IV No antithrombotic prior to admission, now on Eliquis. 3/24 S/p revascularization of left internal jugular, left sigmoid sinus and left transverse sinus. Superior sagittal sinus remained occluded.  Neuro and neuro IR following.  Seen by PT OT who recommends CIR.  Patient is now cleared by neurology for discharge to continue Eliquis for 6 to 9 months.  Recent viral meningitis: Completed course of doxycycline.  Hyperlipidemia: On atorvastatin 80 mg PTA.  LDL to 79.  Goal<70.  Continue atorvastatin.   Deaf and communicates via [place] sign language. - Call [phone] M-F [phone], [phone] for after hours pager.   Constipation - resolved, BM overnight 3/25. - Colace, Dulcolax.   DVT prophylaxis: SCDs Start: 02/01/24 2045   Code Status: Full Code  Family Communication: Mother present at bedside.  Plan of care discussed with patient in length and he/she verbalized understanding and agreed with it.  Status is: Inpatient Remains inpatient appropriate because: Patient medically stable and cleared for discharge from neurostandpoint but needs CIR, awaiting placement.   Estimated body mass index is 28.08 kg/m as calculated from the following:   Height as  of this encounter: 5\' 4"  (1.626 m).   Weight as of this encounter: 74.2 kg.    Nutritional  Assessment: Body mass index is 28.08 kg/m.Marland Kitchen Seen by dietician.  I agree with the assessment and plan as outlined below: Nutrition Status: Nutrition Problem: Inadequate oral intake Etiology: inability to eat Signs/Symptoms: NPO status Interventions: Tube feeding  . Skin Assessment: I have examined the patient's skin and I agree with the wound assessment as performed by the wound care RN as outlined below:    Consultants:  Neurology-signed off  Procedures:  As above  Antimicrobials:  Anti-infectives (From admission, onward)    Start     Dose/Rate Route Frequency Ordered Stop   02/02/24 1500  ceFAZolin (ANCEF) IVPB 2g/100 mL premix        2 g 200 mL/hr over 30 Minutes Intravenous  Once 02/02/24 1455 02/02/24 1835   02/01/24 2100  doxycycline (VIBRAMYCIN) 100 mg in sodium chloride 0.9 % 250 mL IVPB  Status:  Discontinued        100 mg 125 mL/hr over 120 Minutes Intravenous Every 12 hours 02/01/24 2052 02/02/24 0931         Subjective: Patient seen and examined earlier using sign language interpreter and using jarai in person interpreter for the mother who was at the bedside.  Patient was still complaining of headache at the back of the head, at times this headache will wake him up in the middle of the night.  He was also complaining of pain that was radiating from the neck all the way to the right leg.  Neuro is aware of those findings.  Objective: Vitals:   02/04/24 2326 02/05/24 0331 02/05/24 0748 02/05/24 1114  BP: 101/72 107/72 120/79 113/73  Pulse: (!) 55 (!) 53  (!) 58  Resp: 15 16 16 18   Temp: 98.5 F (36.9 C) 98.5 F (36.9 C) 98.4 F (36.9 C) 98.7 F (37.1 C)  TempSrc:   Oral Oral  SpO2: 99% 98% 99% 99%  Weight:      Height:        Intake/Output Summary (Last 24 hours) at 02/05/2024 1519 Last data filed at 02/05/2024 0700 Gross per 24 hour  Intake 240 ml  Output 1200 ml  Net -960 ml   Filed Weights   02/02/24 0500 02/03/24 0500 02/04/24 0500  Weight:  68.9 kg 74.2 kg 74.2 kg    Examination:  General exam: Appears calm and comfortable  Respiratory system: Clear to auscultation. Respiratory effort normal. Cardiovascular system: S1 & S2 heard, RRR. No JVD, murmurs, rubs, gallops or clicks. No pedal edema. Gastrointestinal system: Abdomen is nondistended, soft and nontender. No organomegaly or masses felt. Normal bowel sounds heard. Central nervous system: Alert and oriented.  Slight weakness in the right upper and lower extremity.  Cranial nerves intact.  No other deficit. Skin: No rashes, lesions or ulcers  Data Reviewed: I have personally reviewed following labs and imaging studies  CBC: Recent Labs  Lab 02/01/24 1811 02/01/24 1816 02/02/24 0233 02/02/24 1957 02/03/24 0505 02/04/24 0609 02/05/24 0628  WBC 14.1*  --  15.2*  --  10.3 7.6 6.4  NEUTROABS 11.3*  --   --   --  8.3*  --   --   HGB 16.1   < > 14.9 12.9* 12.1* 11.1* 11.6*  HCT 50.3   < > 45.2 38.0* 37.5* 34.9* 35.2*  MCV 83.8  --  81.7  --  83.0 83.7 83.0  PLT 260  --  260  --  237 209 217   < > = values in this interval not displayed.   Basic Metabolic Panel: Recent Labs  Lab 02/01/24 1811 02/01/24 1816 02/02/24 0233 02/02/24 1957 02/03/24 0505 02/04/24 0609 02/05/24 0628  NA 139 141  --  143 140 141 139  K 3.8 3.8  --  3.9 3.6 3.4* 3.5  CL 107 110  --   --  115* 111 109  CO2 21*  --   --   --  21* 21* 24  GLUCOSE 135* 133*  --   --  109* 96 102*  BUN 21* 25*  --   --  13 12 10   CREATININE 0.98 1.00  --   --  0.79 0.92 0.89  CALCIUM 9.6  --   --   --  8.3* 8.4* 8.5*  MG  --   --  2.3  --  2.1 2.1 1.9  PHOS  --   --   --   --  3.9 3.2 3.0   GFR: Estimated Creatinine Clearance: 107.9 mL/min (by C-G formula based on SCr of 0.89 mg/dL). Liver Function Tests: Recent Labs  Lab 02/01/24 1811  AST 20  ALT 76*  ALKPHOS 44  BILITOT 1.3*  PROT 7.6  ALBUMIN 4.0   No results for input(s): "LIPASE", "AMYLASE" in the last 168 hours. No results for  input(s): "AMMONIA" in the last 168 hours. Coagulation Profile: Recent Labs  Lab 02/01/24 1811  INR 1.0   Cardiac Enzymes: No results for input(s): "CKTOTAL", "CKMB", "CKMBINDEX", "TROPONINI" in the last 168 hours. BNP (last 3 results) No results for input(s): "PROBNP" in the last 8760 hours. HbA1C: No results for input(s): "HGBA1C" in the last 72 hours. CBG: Recent Labs  Lab 02/01/24 1805  GLUCAP 129*   Lipid Profile: Recent Labs    02/03/24 0505  TRIG 155*   Thyroid Function Tests: No results for input(s): "TSH", "T4TOTAL", "FREET4", "T3FREE", "THYROIDAB" in the last 72 hours. Anemia Panel: No results for input(s): "VITAMINB12", "FOLATE", "FERRITIN", "TIBC", "IRON", "RETICCTPCT" in the last 72 hours. Sepsis Labs: No results for input(s): "PROCALCITON", "LATICACIDVEN" in the last 168 hours.  Recent Results (from the past 240 hours)  Respiratory (~20 pathogens) panel by PCR     Status: Abnormal   Collection Time: 01/28/24  6:00 AM   Specimen: Nasopharyngeal Swab; Respiratory  Result Value Ref Range Status   Adenovirus NOT DETECTED NOT DETECTED Final   Coronavirus 229E DETECTED (A) NOT DETECTED Final    Comment: (NOTE) The Coronavirus on the Respiratory Panel, DOES NOT test for the novel  Coronavirus (2019 nCoV)    Coronavirus HKU1 NOT DETECTED NOT DETECTED Final   Coronavirus NL63 NOT DETECTED NOT DETECTED Final   Coronavirus OC43 NOT DETECTED NOT DETECTED Final   Metapneumovirus NOT DETECTED NOT DETECTED Final   Rhinovirus / Enterovirus NOT DETECTED NOT DETECTED Final   Influenza A NOT DETECTED NOT DETECTED Final   Influenza B NOT DETECTED NOT DETECTED Final   Parainfluenza Virus 1 NOT DETECTED NOT DETECTED Final   Parainfluenza Virus 2 NOT DETECTED NOT DETECTED Final   Parainfluenza Virus 3 NOT DETECTED NOT DETECTED Final   Parainfluenza Virus 4 NOT DETECTED NOT DETECTED Final   Respiratory Syncytial Virus NOT DETECTED NOT DETECTED Final   Bordetella pertussis  NOT DETECTED NOT DETECTED Final   Bordetella Parapertussis NOT DETECTED NOT DETECTED Final   Chlamydophila pneumoniae NOT DETECTED NOT DETECTED Final   Mycoplasma pneumoniae NOT DETECTED NOT DETECTED  Final    Comment: Performed at [facility], [address], [place], [place] [address]  Culture, blood (Routine X 2) w Reflex to ID Panel     Status: None (Preliminary result)   Collection Time: 02/01/24  9:22 PM   Specimen: BLOOD  Result Value Ref Range Status   Specimen Description BLOOD BLOOD RIGHT FOREARM  Final   Special Requests   Final    BOTTLES DRAWN AEROBIC AND ANAEROBIC Blood Culture results may not be optimal due to an inadequate volume of blood received in culture bottles   Culture   Final    NO GROWTH 4 DAYS Performed at [facility], [address] [place], [place] [address]    Report Status PENDING  Incomplete  Culture, blood (Routine X 2) w Reflex to ID Panel     Status: None (Preliminary result)   Collection Time: 02/01/24  9:23 PM   Specimen: BLOOD  Result Value Ref Range Status   Specimen Description BLOOD BLOOD RIGHT ARM  Final   Special Requests   Final    BOTTLES DRAWN AEROBIC AND ANAEROBIC Blood Culture adequate volume   Culture   Final    NO GROWTH 4 DAYS Performed at [facility], [address] [place], [place] [address]    Report Status PENDING  Incomplete  MRSA Next Gen by PCR, Nasal     Status: None   Collection Time: 02/01/24  9:59 PM   Specimen: Nasal Mucosa; Nasal Swab  Result Value Ref Range Status   MRSA by PCR Next Gen NOT DETECTED NOT DETECTED Final    Comment: (NOTE) The GeneXpert MRSA Assay (FDA approved for NASAL specimens only), is one component of a comprehensive MRSA colonization surveillance program. It is not intended to diagnose MRSA infection nor to guide or monitor treatment for MRSA infections. Test performance is not FDA approved in patients less than 76 years old. Performed at [facility], [address], [place], [place] [address]      Radiology Studies: No results found.  Scheduled Meds:  apixaban  10 mg Oral BID   Followed by   Melene Muller ON 02/11/2024] apixaban  5 mg Oral BID   atorvastatin  80 mg Oral Daily   Chlorhexidine Gluconate Cloth  6 each Topical Daily   feeding supplement  237 mL Oral BID BM   Continuous Infusions:   LOS: 4 days   Hughie Closs, MD [facility]  02/05/2024, 3:19 PM   *Please note that this is a verbal dictation therefore any spelling or grammatical errors are due to the "Dragon Medical One" system interpretation.  Please page via Amion and do not message via secure chat for urgent patient care matters. Secure chat can be used for non urgent patient care matters.  How to contact the [facility] Attending or Consulting provider 7A - 7P or covering provider during after hours 7P -7A, for this patient?  Check the care team in [facility] and look for a) attending/consulting [facility] provider listed and b) the [facility] team listed. Page or secure chat 7A-7P. Log into [email or web address] and use [place]'s universal password to access. If you do not have the password, please contact the hospital operator. Locate the [facility] provider you are looking for under [facility] and page to a number that you can be directly reached. If you still have difficulty reaching the provider, please page the [facility] (Director on Call) for the Hospitalists listed on amion for  assistance.

## 2024-02-05 NOTE — Evaluation (Signed)
 Physical Therapy Evaluation Patient Details Name: Samuel Warren MRN: [id number] DOB: Aug 09, 1991 Today's Date: 02/05/2024  History of Present Illness  Pt admitted 3/23 with right sided weakness.  CT head revealed multiple thrombosed cerebral veins including the SSS, straight sinus, inferior sagittal sinus and both transverse sinuses left worse than right.  Thrombus extended into the left sigmoid sinus and visualized left internal jugular vein. Innumerable enlarged and tortuous cortical veins are visualized, consistent with collateral venous drainage. Enlarged left petrosal veins are present in the middle cranial fossa.     Pt underwent mechanical thrombectomy for clot extraction by Dr. Corliss Skains 3/24.  He was able to open up left transverse and sigmoid sinuses but the clot was quite thick and he was unable to the superior sagittal sinus and transverse and sigmoid sinus on the right.  PMH: deaf, recent admission to the hospital from 3/16-3/20 for acute meningoencephalitis which was treated with antibiotics, TIA, hyperlipidemia  Clinical Impression  Pt admitted with above diagnosis. Spoke with mom ( in room) and brother (via phone) regarding pts prior functional status.  Brother states that pt had been struggling since d/c from hospital and that he would be falling if not assisted by 2 persons ever since d/c.  He stated that pt was dizzy and unsteady.  During evaluation, pt with decr sensation in right LE and once pt was walking, pt had limited gait toward end of walk and difficult to determine if pt had pain in right thigh or numbness or weakness as he kept pointing to thigh however mom could not communicate what pt was trying to convey.  Limited in communication due to pt is deaf and doesn't use ASL well and family has difficulty translating what pt is trying to communicate. Will continue to work to find ways to communicate with pt.  Given pts current status that he needed min assist of 2 for safe ambulation  today with right LE becoming weaker/painful during ambulation thus affecting pts safety and balance, recommend post acute Rehab > 3 hours day. Will continue to follow acutely. Pt currently with functional limitations due to the deficits listed below (see PT Problem List). Pt will benefit from acute skilled PT to increase their independence and safety with mobility to allow discharge.           If plan is discharge home, recommend the following: Assist for transportation;Help with stairs or ramp for entrance;Assistance with cooking/housework;A lot of help with walking and/or transfers;A lot of help with bathing/dressing/bathroom   Can travel by private vehicle        Equipment Recommendations Other (comment) (TBD)  Recommendations for Other Services       Functional Status Assessment Patient has had a recent decline in their functional status and demonstrates the ability to make significant improvements in function in a reasonable and predictable amount of time.     Precautions / Restrictions Precautions Precautions: Fall Recall of Precautions/Restrictions: Intact Restrictions Weight Bearing Restrictions Per Provider Order: No      Mobility  Bed Mobility Overal bed mobility: Needs Assistance Bed Mobility: Supine to Sit, Sit to Supine     Supine to sit: Supervision Sit to supine: Supervision   General bed mobility comments: no physical assist needed    Transfers Overall transfer level: Needs assistance Equipment used: None Transfers: Sit to/from Stand Sit to Stand: Min assist           General transfer comment: Pt able to stand to feet with just CGA initially.  Once fatigued, needed min assist for stand to sit as right LE fatigued vs. in pain.    Ambulation/Gait Ambulation/Gait assistance: Min assist, +2 safety/equipment Gait Distance (Feet): 150 Feet Assistive device: IV Pole, 1 person hand held assist Gait Pattern/deviations: Step-through pattern, Decreased  stride length, Drifts right/left, Trunk flexed, Decreased step length - right, Decreased stance time - right, Decreased weight shift to right Gait velocity: decreased Gait velocity interpretation: <1.31 ft/sec, indicative of household ambulator   General Gait Details: Noted pt with short shuffling steps with low clearance overall.  Pt at times veers left and right with ambulation and needing at least 1 UE support. As pt walked, c/o right LE (unsure if pt with pain or numbness bothering) with pt having more difficulty with walking as PT and pt approached room. OT in hall assisted PT to bring chair so pt could sit down at door and rest.  Pt was able to complete walk back to bed with assist.  Stairs            Wheelchair Mobility     Tilt Bed    Modified Rankin (Stroke Patients Only) Modified Rankin (Stroke Patients Only) Pre-Morbid Rankin Score: Moderately severe disability Modified Rankin: Moderately severe disability     Balance Overall balance assessment: Needs assistance Sitting-balance support: No upper extremity supported, Feet supported Sitting balance-Leahy Scale: Good     Standing balance support: Single extremity supported, During functional activity Standing balance-Leahy Scale: Poor Standing balance comment: relies on 1 UE support and external support                             Pertinent Vitals/Pain Pain Assessment Faces Pain Scale: Hurts even more Pain Location: right LE Pain Descriptors / Indicators: Discomfort Pain Intervention(s): Limited activity within patient's tolerance, Monitored during session, Repositioned    Home Living Family/patient expects to be discharged to:: Private residence Living Arrangements: Parent;Other relatives Available Help at Discharge: Family;Available 24 hours/day (wife can help and parents) Type of Home: House Home Access: Stairs to enter Entrance Stairs-Rails: Lawyer of Steps: 7    Home Layout: One level Home Equipment: None Additional Comments: lives with parents and siblings, 8 people total. 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 Does not drive but father does. Independent ADLs Comments: independent     Extremity/Trunk Assessment   Upper Extremity Assessment Upper Extremity Assessment: Defer to OT evaluation    Lower Extremity Assessment Lower Extremity Assessment: RLE deficits/detail;LLE deficits/detail RLE Deficits / Details: grossly 4/5 RLE Sensation: decreased light touch LLE Deficits / Details: grossly 4/5    Cervical / Trunk Assessment Cervical / Trunk Assessment: Other exceptions Cervical / Trunk Exceptions: forward head  Communication   Communication Communication: Impaired Factors Affecting Communication: Hearing impaired    Cognition Arousal: Alert Behavior During Therapy: WFL for tasks assessed/performed   PT - Cognitive impairments: Difficult to assess Difficult to assess due to: Hard of hearing/deaf, Non-English speaking                     PT - Cognition Comments: pt is deaf and uses minimal ASL. Per OT on last admit, pt able to read and  write some english Following commands: Impaired (due to limited communication with pt's ASL) Following commands impaired: Follows one step commands with increased time     Cueing Cueing Techniques: Gestural cues, Visual cues, Tactile cues     General Comments General comments (skin integrity, edema, etc.): VSS    Exercises     Assessment/Plan    PT Assessment Patient needs continued PT services  PT Problem List Decreased strength;Decreased activity tolerance;Decreased mobility;Pain       PT Treatment Interventions Gait training;Stair  training;Functional mobility training;Therapeutic activities;Therapeutic exercise;Balance training;Patient/family education;Neuromuscular re-education    PT Goals (Current goals can be found in the Care Plan section)  Acute Rehab PT Goals Patient Stated Goal: return home PT Goal Formulation: With patient Time For Goal Achievement: 02/24/24 Potential to Achieve Goals: Good    Frequency Min 3X/week     Co-evaluation PT/OT/SLP Co-Evaluation/Treatment: Yes Reason for Co-Treatment: For patient/therapist safety PT goals addressed during session: Mobility/safety with mobility OT goals addressed during session: ADL's and self-care       AM-PAC PT "6 Clicks" Mobility  Outcome Measure Help needed turning from your back to your side while in a flat bed without using bedrails?: None Help needed moving from lying on your back to sitting on the side of a flat bed without using bedrails?: None Help needed moving to and from a bed to a chair (including a wheelchair)?: Total Help needed standing up from a chair using your arms (e.g., wheelchair or bedside chair)?: Total Help needed to walk in hospital room?: Total Help needed climbing 3-5 steps with a railing? : Total 6 Click Score: 12    End of Session Equipment Utilized During Treatment: Gait belt Activity Tolerance: Patient limited by fatigue Patient left: in bed;with call bell/phone within reach;with family/visitor present;with bed alarm set Nurse Communication: Mobility status PT Visit Diagnosis: Unsteadiness on feet (R26.81);Muscle weakness (generalized) (M62.81);Difficulty in walking, not elsewhere classified (R26.2);Pain Pain - Right/Left: Right Pain - part of body: Leg    Time: 1027-1105 PT Time Calculation (min) (ACUTE ONLY): 38 min   Charges:   PT Evaluation $PT Eval Moderate Complexity: 1 Mod PT Treatments $Gait Training: 8-22 mins PT General Charges $$ ACUTE PT VISIT: 1 Visit         Ariannie Penaloza M,PT Acute Rehab  Services [phone]   Bevelyn Buckles 02/05/2024, 12:31 PM

## 2024-02-05 NOTE — TOC Initial Note (Signed)
 Transition of Care [facility]) - Initial/Assessment Note    Patient Details  Name: Samuel Warren MRN: [id number] Date of Birth: [birth year]  [facility]) CM/SW Contact:    Kermit Balo, RN Phone Number: 02/05/2024, 2:03 PM  Clinical Narrative:                  Pt is from home with parents. Pt is deaf.  No PCP but has new pt appointment scheduled with : Shirline Frees for April 22nd. Information on the AVS.  Current recommendations are for CIR. Awaiting CIR eval. TOC following.  Expected Discharge Plan: IP Rehab Facility Barriers to Discharge: Continued Medical Work up   Patient Goals and CMS Choice   CMS Medicare.gov Compare Post Acute Care list provided to:: Patient Choice offered to / list presented to : Patient, Parent      Expected Discharge Plan and Services   Discharge Planning Services: CM Consult Post Acute Care Choice: IP Rehab Living arrangements for the past 2 months: Single Family Home                                      Prior Living Arrangements/Services Living arrangements for the past 2 months: Single Family Home Lives with:: Parents Patient language and need for interpreter reviewed:: Yes Do you feel safe going back to the place where you live?: Yes        Care giver support system in place?: Yes (comment)   Criminal Activity/Legal Involvement Pertinent to Current Situation/Hospitalization: No - Comment as needed  Activities of Daily Living      Permission Sought/Granted                  Emotional Assessment Appearance:: Appears stated age         Psych Involvement: No (comment)  Admission diagnosis:  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 [place] [address] Phone: [phone] Fax: [phone]     Social Drivers of Health (SDOH) Social History: SDOH Screenings   Food Insecurity: No Food Insecurity (02/02/2024)  Housing: Low Risk  (02/02/2024)  Transportation Needs: No Transportation Needs (02/02/2024)  Utilities: Not At Risk (02/02/2024)  Tobacco Use: Low Risk  (02/02/2024)   SDOH Interventions:     Readmission Risk Interventions    01/29/2024   11:53 AM  Readmission Risk Prevention Plan  Post Dischage Appt Complete  Medication Screening Complete  Transportation Screening Complete

## 2024-02-06 DIAGNOSIS — G08 Intracranial and intraspinal phlebitis and thrombophlebitis: Secondary | ICD-10-CM | POA: Diagnosis not present

## 2024-02-06 LAB — CULTURE, BLOOD (ROUTINE X 2)
Culture: NO GROWTH
Culture: NO GROWTH
Special Requests: ADEQUATE

## 2024-02-06 LAB — CBC
HCT: 40.2 % (ref 39.0–52.0)
Hemoglobin: 12.9 g/dL — ABNORMAL LOW (ref 13.0–17.0)
MCH: 26.6 pg (ref 26.0–34.0)
MCHC: 32.1 g/dL (ref 30.0–36.0)
MCV: 82.9 fL (ref 80.0–100.0)
Platelets: 255 10*3/uL (ref 150–400)
RBC: 4.85 MIL/uL (ref 4.22–5.81)
RDW: 12.6 % (ref 11.5–15.5)
WBC: 7.4 10*3/uL (ref 4.0–10.5)
nRBC: 0 % (ref 0.0–0.2)

## 2024-02-06 LAB — BASIC METABOLIC PANEL WITH GFR
Anion gap: 5 (ref 5–15)
BUN: 14 mg/dL (ref 6–20)
CO2: 24 mmol/L (ref 22–32)
Calcium: 8.7 mg/dL — ABNORMAL LOW (ref 8.9–10.3)
Chloride: 110 mmol/L (ref 98–111)
Creatinine, Ser: 0.91 mg/dL (ref 0.61–1.24)
GFR, Estimated: >60 mL/min (ref >60)
Glucose, Bld: 142 mg/dL — ABNORMAL HIGH (ref 70–99)
Potassium: 3.6 mmol/L (ref 3.5–5.1)
Sodium: 139 mmol/L (ref 135–145)

## 2024-02-06 LAB — MAGNESIUM: Magnesium: 2.2 mg/dL (ref 1.7–2.4)

## 2024-02-06 NOTE — Progress Notes (Signed)
 Physical Therapy Treatment Patient Details Name: Samuel Warren MRN: [id number] DOB: 03/26/1991 Today's Date: 02/06/2024   History of Present Illness Pt admitted 3/23 with right sided weakness.  CT head revealed multiple thrombosed cerebral veins including the SSS, straight sinus, inferior sagittal sinus and both transverse sinuses left worse than right.  Thrombus extended into the left sigmoid sinus and visualized left internal jugular vein. Innumerable enlarged and tortuous cortical veins are visualized, consistent with collateral venous drainage. Enlarged left petrosal veins are present in the middle cranial fossa.  Pt underwent mechanical thrombectomy for clot extraction by Dr. Corliss Skains 3/24.  He was able to open up left transverse and sigmoid sinuses but the clot was quite thick and he was unable to the superior sagittal sinus and transverse and sigmoid sinus on the right.  PMH: deaf, recent admission to the hospital from 3/16-3/20 for acute meningoencephalitis which was treated with antibiotics, TIA, hyperlipidemia    PT Comments  Pt tolerated treatment well today. Pt opted for RW during hallway ambulation. Min A for directions and obstacles navigation. Pt also required cues from mother for upright posture. Chair follow for safety however ultimately not needed. Pt brother on phone to help provide [place] interpretation with pt mother. Unable to secure ASL interpreter so PT used max gestural and tactile cues to guide patient. No change in DC/DME recs at this time. PT will continue to follow.    If plan is discharge home, recommend the following: Assist for transportation;Help with stairs or ramp for entrance;Assistance with cooking/housework;A lot of help with walking and/or transfers;A lot of help with bathing/dressing/bathroom   Can travel by private vehicle        Equipment Recommendations  None recommended by PT    Recommendations for Other Services       Precautions / Restrictions  Precautions Precautions: Fall Recall of Precautions/Restrictions: Intact Restrictions Weight Bearing Restrictions Per Provider Order: No     Mobility  Bed Mobility Overal bed mobility: Needs Assistance Bed Mobility: Supine to Sit, Sit to Supine     Supine to sit: Supervision Sit to supine: Supervision   General bed mobility comments: no physical assist needed    Transfers Overall transfer level: Needs assistance Equipment used: None Transfers: Sit to/from Stand Sit to Stand: Contact guard assist           General transfer comment: Pt able to stand to feet with just CGA.    Ambulation/Gait Ambulation/Gait assistance: Min assist, +2 safety/equipment Gait Distance (Feet): 150 Feet Assistive device: Rolling walker (2 wheels) Gait Pattern/deviations: Step-through pattern, Decreased stride length, Drifts right/left, Trunk flexed, Decreased step length - right, Decreased stance time - right, Decreased weight shift to right Gait velocity: decreased     General Gait Details: Pt opted for RW during hallway ambulation. Min A for directions and obstacles navigation. Pt also required cues from mother for upright posture. Chair follow for safety however ultimately not needed.   Stairs             Wheelchair Mobility     Tilt Bed    Modified Rankin (Stroke Patients Only)       Balance Overall balance assessment: Needs assistance Sitting-balance support: No upper extremity supported, Feet supported Sitting balance-Leahy Scale: Good     Standing balance support: Single extremity supported, During functional activity Standing balance-Leahy Scale: Poor Standing balance comment: relies on 1 UE support and external support  Communication Communication Communication: Impaired Factors Affecting Communication: Hearing impaired  Cognition Arousal: Alert Behavior During Therapy: WFL for tasks assessed/performed                              Following commands: Impaired Following commands impaired: Follows one step commands with increased time (difficult to assess)    Cueing Cueing Techniques: Gestural cues, Tactile cues, Visual cues  Exercises      General Comments General comments (skin integrity, edema, etc.): VSS. Brother on phone provided some interpretation.      Pertinent Vitals/Pain Pain Assessment Pain Assessment: No/denies pain    Home Living                          Prior Function            PT Goals (current goals can now be found in the care plan section) Progress towards PT goals: Progressing toward goals    Frequency    Min 3X/week      PT Plan      Co-evaluation              AM-PAC PT "6 Clicks" Mobility   Outcome Measure  Help needed turning from your back to your side while in a flat bed without using bedrails?: None Help needed moving from lying on your back to sitting on the side of a flat bed without using bedrails?: None Help needed moving to and from a bed to a chair (including a wheelchair)?: A Little Help needed standing up from a chair using your arms (e.g., wheelchair or bedside chair)?: A Little Help needed to walk in hospital room?: A Little Help needed climbing 3-5 steps with a railing? : Total 6 Click Score: 18    End of Session Equipment Utilized During Treatment: Gait belt Activity Tolerance: Patient tolerated treatment well Patient left: in bed;with call bell/phone within reach;with family/visitor present;with bed alarm set Nurse Communication: Mobility status PT Visit Diagnosis: Unsteadiness on feet (R26.81);Muscle weakness (generalized) (M62.81);Difficulty in walking, not elsewhere classified (R26.2);Pain     Time: 1610-9604 PT Time Calculation (min) (ACUTE ONLY): 24 min  Charges:    $Gait Training: 8-22 mins $Therapeutic Activity: 8-22 mins PT General Charges $$ ACUTE PT VISIT: 1 Visit                      Shela Nevin, PT, DPT Acute Rehab Services [phone]    Samuel Warren 02/06/2024, 3:47 PM

## 2024-02-06 NOTE — Progress Notes (Signed)
 PROGRESS NOTE    Samuel Warren  WUJ:[id number] DOB: Feb 23, 1991 DOA: 02/01/2024 PCP: Pcp, No   Brief Narrative:  Samuel Warren is a 33 y.o. 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   Reportedly, patient was discharged 2-3 days ago on doxy after being found to have meningitis. His mother called him and stated he was not doing well and needed to go to hospital. She described he was unable to move or respond (per the brother).  He also reportedly complained of HA, N/V. Pt's family ultimately called EMS and code stroke initiated.  He was afebrile in the ED.  CTH in ed revealed multiple venous thrombi without acute hypodensity.  Admitted in ICU under critical care, neuroconsulted.   Significant Hospital Events: Including procedures, antibiotic start and stop dates in addition to other pertinent events   Readmitted to ICU 3/23 3/24 Left common carotid carotid arteriogram and transverse sinus sigmoid sinus with occluded posterior half of superior sagittal sinus and occluded dominant left transverse sinus, left sigmoid sinus and proximal internal jugular vein. 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  Assessment & Plan:   Principal Problem:   Acute cerebral venous sinus thrombosis Active Problems:   Dural venous sinus thrombosis  Acute cerebral venous thrombosis - likely 2/2 meningoencephalitis. Code Stroke CT head multiple thrombosed cerebral veins including the SSS, straight sinus, inferior sagittal sinus and both transverse sinuses left worse than right. No acute hypodensity is seen  CTA head & neck  Diffuse dural sinus thrombosis  CTV Extensive dural sinus thrombus within the posterior aspect of the superior sagittal sinus, the straight sinus, the  inferior sagittal sinus and left greater than right transverse sinus. Thrombus extends into the left sigmoid sinus and visualized left internal jugular vein. Innumerable enlarged and tortuous cortical veins are visualized, consistent with collateral venous drainage. Enlarged left petrosal veins are present in the middle cranial fossa Repeat CT head Diffuse hyperdensity throughout the major dural sinuses.  ssociated cerebral edema with diffuse loss of cortical sulcation and 7 mm of cerebellar tonsillar ectopia, stable. 2D Echo 3/18 EF 45 to 50%. Apical hypokinesis with overall mild LV dysfunction  LDL 279 HgbA1c 5.7 VTE prophylaxis -for an IV No antithrombotic prior to admission, now on Eliquis. 3/24 S/p revascularization of left internal jugular, left sigmoid sinus and left transverse sinus. Superior sagittal sinus remained occluded.  Neuro and neuro IR following.  Seen by PT OT who recommends CIR.  Patient is now cleared by neurology for discharge to continue Eliquis for 6 to 9 months.  Recent viral meningitis: Completed course of doxycycline.  Hyperlipidemia: On atorvastatin 80 mg PTA.  LDL to 79.  Goal<70.  Continue atorvastatin.   Deaf and communicates via [place] sign language. - Call [phone] M-F [phone], [phone] for after hours pager.   Constipation - resolved, BM overnight 3/25. - Colace, Dulcolax.   DVT prophylaxis: SCDs Start: 02/01/24 2045   Code Status: Full Code  Family Communication: Mother present at bedside.  Plan of care discussed with patient in length and he/she verbalized understanding and agreed with it.  Status is: Inpatient Remains inpatient appropriate because: Patient medically stable and cleared for discharge from neurostandpoint but needs CIR, awaiting placement.   Estimated body mass index is 25.58 kg/m as calculated from the following:   Height as  of this encounter: 5\' 4"  (1.626 m).   Weight as of this encounter: 67.6 kg.    Nutritional  Assessment: Body mass index is 25.58 kg/m.Marland Kitchen Seen by dietician.  I agree with the assessment and plan as outlined below: Nutrition Status: Nutrition Problem: Inadequate oral intake Etiology: inability to eat Signs/Symptoms: NPO status Interventions: Ensure Enlive (each supplement provides 350kcal and 20 grams of protein), MVI  . Skin Assessment: I have examined the patient's skin and I agree with the wound assessment as performed by the wound care RN as outlined below:    Consultants:  Neurology-signed off  Procedures:  As above  Antimicrobials:  Anti-infectives (From admission, onward)    Start     Dose/Rate Route Frequency Ordered Stop   02/02/24 1500  ceFAZolin (ANCEF) IVPB 2g/100 mL premix        2 g 200 mL/hr over 30 Minutes Intravenous  Once 02/02/24 1455 02/02/24 1835   02/01/24 2100  doxycycline (VIBRAMYCIN) 100 mg in sodium chloride 0.9 % 250 mL IVPB  Status:  Discontinued        100 mg 125 mL/hr over 120 Minutes Intravenous Every 12 hours 02/01/24 2052 02/02/24 0931         Subjective: Patient seen and examined using sign language interpreter and using jarai in person interpreter for the mother who was at the bedside.  Still complains of occipital headache and the pain radiating to the right leg but slightly improved compared to yesterday.  He appears better than yesterday as well.  No other complaints.  No other questions.  Plan of care and plan of discharge and will wait for insurance authorization discussed with the family.  Objective: Vitals:   02/06/24 0015 02/06/24 0344 02/06/24 0500 02/06/24 0750  BP: 102/69 111/68  98/61  Pulse: (!) 58 (!) 57  64  Resp: 18 16  16   Temp: 97.9 F (36.6 C) 98.1 F (36.7 C)  98.2 F (36.8 C)  TempSrc: Oral Oral  Oral  SpO2: 100% 100%  99%  Weight:   67.6 kg   Height:        Intake/Output Summary (Last 24 hours) at 02/06/2024 0756 Last data filed at 02/05/2024 1600 Gross per 24 hour  Intake --  Output 800 ml  Net  -800 ml   Filed Weights   02/03/24 0500 02/04/24 0500 02/06/24 0500  Weight: 74.2 kg 74.2 kg 67.6 kg    Examination:  General exam: Appears calm and comfortable  Respiratory system: Clear to auscultation. Respiratory effort normal. Cardiovascular system: S1 & S2 heard, RRR. No JVD, murmurs, rubs, gallops or clicks. No pedal edema. Gastrointestinal system: Abdomen is nondistended, soft and nontender. No organomegaly or masses felt. Normal bowel sounds heard. Central nervous system: Alert and oriented.  Slight weakness in the right upper and lower extremity, gradually improving. Extremities: Symmetric 5 x 5 power. Skin: No rashes, lesions or ulcers.    Data Reviewed: I have personally reviewed following labs and imaging studies  CBC: Recent Labs  Lab 02/01/24 1811 02/01/24 1816 02/02/24 0233 02/02/24 1957 02/03/24 0505 02/04/24 0609 02/05/24 0628  WBC 14.1*  --  15.2*  --  10.3 7.6 6.4  NEUTROABS 11.3*  --   --   --  8.3*  --   --   HGB 16.1   < > 14.9 12.9* 12.1* 11.1* 11.6*  HCT 50.3   < > 45.2 38.0* 37.5* 34.9* 35.2*  MCV 83.8  --  81.7  --  83.0 83.7  83.0  PLT 260  --  260  --  237 209 217   < > = values in this interval not displayed.   Basic Metabolic Panel: Recent Labs  Lab 02/01/24 1811 02/01/24 1816 02/02/24 0233 02/02/24 1957 02/03/24 0505 02/04/24 0609 02/05/24 0628  NA 139 141  --  143 140 141 139  K 3.8 3.8  --  3.9 3.6 3.4* 3.5  CL 107 110  --   --  115* 111 109  CO2 21*  --   --   --  21* 21* 24  GLUCOSE 135* 133*  --   --  109* 96 102*  BUN 21* 25*  --   --  13 12 10   CREATININE 0.98 1.00  --   --  0.79 0.92 0.89  CALCIUM 9.6  --   --   --  8.3* 8.4* 8.5*  MG  --   --  2.3  --  2.1 2.1 1.9  PHOS  --   --   --   --  3.9 3.2 3.0   GFR: Estimated Creatinine Clearance: 97.9 mL/min (by C-G formula based on SCr of 0.89 mg/dL). Liver Function Tests: Recent Labs  Lab 02/01/24 1811  AST 20  ALT 76*  ALKPHOS 44  BILITOT 1.3*  PROT 7.6  ALBUMIN  4.0   No results for input(s): "LIPASE", "AMYLASE" in the last 168 hours. No results for input(s): "AMMONIA" in the last 168 hours. Coagulation Profile: Recent Labs  Lab 02/01/24 1811  INR 1.0   Cardiac Enzymes: No results for input(s): "CKTOTAL", "CKMB", "CKMBINDEX", "TROPONINI" in the last 168 hours. BNP (last 3 results) No results for input(s): "PROBNP" in the last 8760 hours. HbA1C: No results for input(s): "HGBA1C" in the last 72 hours. CBG: Recent Labs  Lab 02/01/24 1805  GLUCAP 129*   Lipid Profile: No results for input(s): "CHOL", "HDL", "LDLCALC", "TRIG", "CHOLHDL", "LDLDIRECT" in the last 72 hours.  Thyroid Function Tests: No results for input(s): "TSH", "T4TOTAL", "FREET4", "T3FREE", "THYROIDAB" in the last 72 hours. Anemia Panel: No results for input(s): "VITAMINB12", "FOLATE", "FERRITIN", "TIBC", "IRON", "RETICCTPCT" in the last 72 hours. Sepsis Labs: No results for input(s): "PROCALCITON", "LATICACIDVEN" in the last 168 hours.  Recent Results (from the past 240 hours)  Respiratory (~20 pathogens) panel by PCR     Status: Abnormal   Collection Time: 01/28/24  6:00 AM   Specimen: Nasopharyngeal Swab; Respiratory  Result Value Ref Range Status   Adenovirus NOT DETECTED NOT DETECTED Final   Coronavirus 229E DETECTED (A) NOT DETECTED Final    Comment: (NOTE) The Coronavirus on the Respiratory Panel, DOES NOT test for the novel  Coronavirus (2019 nCoV)    Coronavirus HKU1 NOT DETECTED NOT DETECTED Final   Coronavirus NL63 NOT DETECTED NOT DETECTED Final   Coronavirus OC43 NOT DETECTED NOT DETECTED Final   Metapneumovirus NOT DETECTED NOT DETECTED Final   Rhinovirus / Enterovirus NOT DETECTED NOT DETECTED Final   Influenza A NOT DETECTED NOT DETECTED Final   Influenza B NOT DETECTED NOT DETECTED Final   Parainfluenza Virus 1 NOT DETECTED NOT DETECTED Final   Parainfluenza Virus 2 NOT DETECTED NOT DETECTED Final   Parainfluenza Virus 3 NOT DETECTED NOT  DETECTED Final   Parainfluenza Virus 4 NOT DETECTED NOT DETECTED Final   Respiratory Syncytial Virus NOT DETECTED NOT DETECTED Final   Bordetella pertussis NOT DETECTED NOT DETECTED Final   Bordetella Parapertussis NOT DETECTED NOT DETECTED Final   Chlamydophila pneumoniae NOT DETECTED  NOT DETECTED Final   Mycoplasma pneumoniae NOT DETECTED NOT DETECTED Final    Comment: Performed at [facility], [address], [place], [place] [address]  Culture, blood (Routine X 2) w Reflex to ID Panel     Status: None (Preliminary result)   Collection Time: 02/01/24  9:22 PM   Specimen: BLOOD  Result Value Ref Range Status   Specimen Description BLOOD BLOOD RIGHT FOREARM  Final   Special Requests   Final    BOTTLES DRAWN AEROBIC AND ANAEROBIC Blood Culture results may not be optimal due to an inadequate volume of blood received in culture bottles   Culture   Final    NO GROWTH 4 DAYS Performed at [facility], [address], [place], [place] [address]    Report Status PENDING  Incomplete  Culture, blood (Routine X 2) w Reflex to ID Panel     Status: None (Preliminary result)   Collection Time: 02/01/24  9:23 PM   Specimen: BLOOD  Result Value Ref Range Status   Specimen Description BLOOD BLOOD RIGHT ARM  Final   Special Requests   Final    BOTTLES DRAWN AEROBIC AND ANAEROBIC Blood Culture adequate volume   Culture   Final    NO GROWTH 4 DAYS Performed at [facility], [address], [place], [place] [address]    Report Status PENDING  Incomplete  MRSA Next Gen by PCR, Nasal     Status: None   Collection Time: 02/01/24  9:59 PM   Specimen: Nasal Mucosa; Nasal Swab  Result Value Ref Range Status   MRSA by PCR Next Gen NOT DETECTED NOT DETECTED Final    Comment: (NOTE) The GeneXpert MRSA Assay (FDA approved for NASAL specimens only), is one component of a comprehensive MRSA colonization surveillance program. It is not intended to diagnose MRSA infection nor to guide or  monitor treatment for MRSA infections. Test performance is not FDA approved in patients less than 33 years old. Performed at [facility], [address], [place], [place] [address]      Radiology Studies: No results found.  Scheduled Meds:  apixaban  10 mg Oral BID   Followed by   Melene Muller ON 02/11/2024] apixaban  5 mg Oral BID   atorvastatin  80 mg Oral Daily   Chlorhexidine Gluconate Cloth  6 each Topical Daily   feeding supplement  237 mL Oral BID BM   multivitamin with minerals  1 tablet Oral Daily   Continuous Infusions:   LOS: 5 days   Hughie Closs, MD [facility]  02/06/2024, 7:56 AM   *Please note that this is a verbal dictation therefore any spelling or grammatical errors are due to the "Dragon Medical One" system interpretation.  Please page via Amion and do not message via secure chat for urgent patient care matters. Secure chat can be used for non urgent patient care matters.  How to contact the [facility] Attending or Consulting provider 7A - 7P or covering provider during after hours 7P -7A, for this patient?  Check the care team in [facility] and look for a) attending/consulting [facility] provider listed and b) the [facility] team listed. Page or secure chat 7A-7P. Log into [email or web address] and use [place]'s universal password to access. If you do not have the password, please contact the hospital operator. Locate the [facility] provider you are looking for under [facility] and page to a number that you can be directly reached. If  you still have difficulty reaching the provider, please page the [facility] (Director on Call) for the Hospitalists listed on amion for assistance.

## 2024-02-06 NOTE — Progress Notes (Signed)
 Physical Medicine & Rehabilitation Consult Service  Pt discussed with rehab admissions coordinator. Chart has been reviewed. This is a 33 yo [place] male who is deaf who was discharged in February with vasculitis and meningeal encephalitis after a wild boar hunting trip. He presented again to the ED on 02/01/24 with headache and cervicalgia along with right sided weakness. Imaging revealed diffuse cerebral venous sinus thrombosis with associated cerebral edema. Pt was placed on IV heparin and transitioned to Eliquis. Headaches treated with oxycodone. + constipation. .   Home: Home Living Family/patient expects to be discharged to:: Private residence Living Arrangements: Parent, Other relatives Available Help at Discharge: Family, Available 24 hours/day (wife can help and parents) Type of Home: House Home Access: Stairs to enter Secretary/administrator of Steps: 7 Entrance Stairs-Rails: Left, Right Home Layout: One level Bathroom Shower/Tub: Engineer, manufacturing systems: Standard Home Equipment: None Additional Comments: lives with parents and siblings, 8 people total. 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 Does not drive but father does. Independent ADLs Comments: independent Functional Status:  Mobility: Bed Mobility Overal bed mobility: Needs Assistance Bed Mobility: Supine to Sit, Sit to Supine Supine to sit: Supervision Sit to supine: Supervision General bed mobility comments: no physical assist needed Transfers Overall transfer level: Needs assistance Equipment used: None Transfers: Sit to/from Stand Sit to Stand: Min assist General transfer comment: Pt able to stand to feet with just CGA initially.  Once fatigued, needed min assist for  stand to sit as right LE fatigued vs. in pain. Ambulation/Gait Ambulation/Gait assistance: Min assist, +2 safety/equipment Gait Distance (Feet): 150 Feet Assistive device: IV Pole, 1 person hand held assist Gait Pattern/deviations: Step-through pattern, Decreased stride length, Drifts right/left, Trunk flexed, Decreased step length - right, Decreased stance time - right, Decreased weight shift to right General Gait Details: Noted pt with short shuffling steps with low clearance overall.  Pt at times veers left and right with ambulation and needing at least 1 UE support. As pt walked, c/o right LE (unsure if pt with pain or numbness bothering) with pt having more difficulty with walking as PT and pt approached room. OT in hall assisted PT to bring chair so pt could sit down at door and rest.  Pt was able to complete walk back to bed with assist. Gait velocity: decreased Gait velocity interpretation: <1.31 ft/sec, indicative of household ambulator    ADL: ADL Overall ADL's : Needs assistance/impaired Grooming: Set up, Supervision/safety, Sitting Upper Body Bathing: Supervision/ safety, Set up, Sitting Lower Body Bathing: Minimal assistance, Sit to/from stand Upper Body Dressing : Minimal assistance, Sitting Lower Body Dressing: Sit to/from stand, Minimal assistance Toilet Transfer: Moderate assistance Toileting- Clothing Manipulation and Hygiene: Minimal assistance Functional mobility during ADLs: Moderate assistance (AT TIMES)  Cognition: Cognition Orientation Level: Other (comment) (pt non verbal/unable to assess) Cognition Arousal: Alert Behavior During Therapy: WFL for tasks assessed/performed   Assessment: 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 Additionally, the patient requires daily MD oversight of the active  medical issues noted above. Projected goals would be mod I to supervision with an ELOS of 7-9 days.  Dispo and social supports are appropriate.    Rehab Admissions  Coordinator to follow up.    Ranelle Oyster, MD, [facility] Physical Medicine & Rehabilitation Medical Director Rehabilitation Services 02/06/2024

## 2024-02-06 NOTE — Progress Notes (Signed)
 Inpatient Rehab Admissions Coordinator:    I met with Pt. And mother with assist of Estanislado Spire and ASL interpreter. They are interested in CIR. I will open case with insurance and pursue for admit.   Megan Salon, MS, CCC-SLP Rehab Admissions Coordinator  [phone] (celll) [phone] (office)

## 2024-02-07 DIAGNOSIS — G08 Intracranial and intraspinal phlebitis and thrombophlebitis: Secondary | ICD-10-CM | POA: Diagnosis not present

## 2024-02-07 NOTE — Plan of Care (Signed)
  Problem: Clinical Measurements: Goal: Ability to maintain clinical measurements within normal limits will improve Outcome: Progressing Goal: Will remain free from infection Outcome: Progressing Goal: Diagnostic test results will improve Outcome: Progressing Goal: Respiratory complications will improve Outcome: Progressing Goal: Cardiovascular complication will be avoided Outcome: Progressing   Problem: Nutrition: Goal: Adequate nutrition will be maintained Outcome: Progressing   Problem: Activity: Goal: Risk for activity intolerance will decrease Outcome: Progressing   Problem: Elimination: Goal: Will not experience complications related to bowel motility Outcome: Progressing Goal: Will not experience complications related to urinary retention Outcome: Progressing   Problem: Pain Managment: Goal: General experience of comfort will improve and/or be controlled Outcome: Progressing   Problem: Safety: Goal: Ability to remain free from injury will improve Outcome: Progressing

## 2024-02-07 NOTE — Progress Notes (Signed)
 PROGRESS NOTE    Samuel Warren  ZOX:[id number] DOB: [birth year] DOA: 02/01/2024 PCP: Pcp, No   Brief Narrative:  Samuel Warren is a 33 y.o. 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   Reportedly, patient was discharged 2-3 days ago on doxy after being found to have meningitis. His mother called him and stated he was not doing well and needed to go to hospital. She described he was unable to move or respond (per the brother).  He also reportedly complained of HA, N/V. Pt's family ultimately called EMS and code stroke initiated.  He was afebrile in the ED.  CTH in ed revealed multiple venous thrombi without acute hypodensity.  Admitted in ICU under critical care, neuroconsulted.   Significant Hospital Events: Including procedures, antibiotic start and stop dates in addition to other pertinent events   Readmitted to ICU 3/23 3/24 Left common carotid carotid arteriogram and transverse sinus sigmoid sinus with occluded posterior half of superior sagittal sinus and occluded dominant left transverse sinus, left sigmoid sinus and proximal internal jugular vein. 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  Assessment & Plan:   Principal Problem:   Acute cerebral venous sinus thrombosis Active Problems:   Dural venous sinus thrombosis  Acute cerebral venous thrombosis - likely 2/2 meningoencephalitis. Code Stroke CT head multiple thrombosed cerebral veins including the SSS, straight sinus, inferior sagittal sinus and both transverse sinuses left worse than right. No acute hypodensity is seen  CTA head & neck  Diffuse dural sinus thrombosis  CTV Extensive dural sinus thrombus within the posterior aspect of the superior sagittal sinus, the straight sinus, the  inferior sagittal sinus and left greater than right transverse sinus. Thrombus extends into the left sigmoid sinus and visualized left internal jugular vein. Innumerable enlarged and tortuous cortical veins are visualized, consistent with collateral venous drainage. Enlarged left petrosal veins are present in the middle cranial fossa Repeat CT head Diffuse hyperdensity throughout the major dural sinuses.  ssociated cerebral edema with diffuse loss of cortical sulcation and 7 mm of cerebellar tonsillar ectopia, stable. 2D Echo 3/18 EF 45 to 50%. Apical hypokinesis with overall mild LV dysfunction  LDL 279 HgbA1c 5.7 VTE prophylaxis -for an IV No antithrombotic prior to admission, now on Eliquis. 3/24 S/p revascularization of left internal jugular, left sigmoid sinus and left transverse sinus. Superior sagittal sinus remained occluded.  Neuro and neuro IR following.  Seen by PT OT who recommends CIR.  Patient is now cleared by neurology for discharge to continue Eliquis for 6 to 9 months.  Recent viral meningitis: Completed course of doxycycline.  Hyperlipidemia: On atorvastatin 80 mg PTA.  LDL to 79.  Goal<70.  Continue atorvastatin.   Deaf and communicates via [place] sign language. - Call [phone] M-F [phone], [phone] for after hours pager.   Constipation - resolved, BM overnight 3/25. - Colace, Dulcolax.   DVT prophylaxis: SCDs Start: 02/01/24 2045   Code Status: Full Code  Family Communication: Mother present at bedside.  Plan of care discussed with patient in length and he/she verbalized understanding and agreed with it.  Status is: Inpatient Remains inpatient appropriate because: Patient medically stable and cleared for discharge from neurostandpoint but needs CIR, awaiting placement.   Estimated body mass index is 25.51 kg/m as calculated from the following:   Height as  of this encounter: 5\' 4"  (1.626 m).   Weight as of this encounter: 67.4 kg.    Nutritional  Assessment: Body mass index is 25.51 kg/m.Marland Kitchen Seen by dietician.  I agree with the assessment and plan as outlined below: Nutrition Status: Nutrition Problem: Inadequate oral intake Etiology: inability to eat Signs/Symptoms: NPO status Interventions: Ensure Enlive (each supplement provides 350kcal and 20 grams of protein), MVI  . Skin Assessment: I have examined the patient's skin and I agree with the wound assessment as performed by the wound care RN as outlined below:    Consultants:  Neurology-signed off  Procedures:  As above  Antimicrobials:  Anti-infectives (From admission, onward)    Start     Dose/Rate Route Frequency Ordered Stop   02/02/24 1500  ceFAZolin (ANCEF) IVPB 2g/100 mL premix        2 g 200 mL/hr over 30 Minutes Intravenous  Once 02/02/24 1455 02/02/24 1835   02/01/24 2100  doxycycline (VIBRAMYCIN) 100 mg in sodium chloride 0.9 % 250 mL IVPB  Status:  Discontinued        100 mg 125 mL/hr over 120 Minutes Intravenous Every 12 hours 02/01/24 2052 02/02/24 0931         Subjective: Patient seen and examined.  His wife and his sister were at the bedside, his sister was able to help with interpretation today.  Patient still is having occipital headache but it is reducing in intensity and now down to 5-6 out of 10.  Patient appears very happy and cheerful today because he is meeting his 48-month-old daughter and wife.  Objective: Vitals:   02/06/24 1514 02/07/24 0002 02/07/24 0404 02/07/24 0845  BP: 108/65 98/62 100/70 103/68  Pulse: (!) 59 73 (!) 59 66  Resp: 18 14 18 17   Temp: 98.1 F (36.7 C) 97.8 F (36.6 C) 98.3 F (36.8 C) 98.2 F (36.8 C)  TempSrc: Oral Oral Oral Oral  SpO2: 99% 99% 99% 99%  Weight:   67.4 kg   Height:       No intake or output data in the 24 hours ending 02/07/24 1051  Filed Weights   02/04/24 0500 02/06/24 0500 02/07/24 0404  Weight: 74.2 kg 67.6 kg 67.4 kg    Examination:  General exam: Appears calm and comfortable   Respiratory system: Clear to auscultation. Respiratory effort normal. Cardiovascular system: S1 & S2 heard, RRR. No JVD, murmurs, rubs, gallops or clicks. No pedal edema. Gastrointestinal system: Abdomen is nondistended, soft and nontender. No organomegaly or masses felt. Normal bowel sounds heard. Central nervous system: Alert and oriented.  Slight weakness in the right upper and lower extremity, gradually improving. Extremities: Symmetric 5 x 5 power. Skin: No rashes, lesions or ulcers.    Data Reviewed: I have personally reviewed following labs and imaging studies  CBC: Recent Labs  Lab 02/01/24 1811 02/01/24 1816 02/02/24 0233 02/02/24 1957 02/03/24 0505 02/04/24 0609 02/05/24 0628 02/06/24 0745  WBC 14.1*  --  15.2*  --  10.3 7.6 6.4 7.4  NEUTROABS 11.3*  --   --   --  8.3*  --   --   --   HGB 16.1   < > 14.9 12.9* 12.1* 11.1* 11.6* 12.9*  HCT 50.3   < > 45.2 38.0* 37.5* 34.9* 35.2* 40.2  MCV 83.8  --  81.7  --  83.0 83.7 83.0 82.9  PLT 260  --  260  --  237 209 217 255   < > = values in  this interval not displayed.   Basic Metabolic Panel: Recent Labs  Lab 02/01/24 1811 02/01/24 1816 02/02/24 0233 02/02/24 1957 02/03/24 0505 02/04/24 0609 02/05/24 0628 02/06/24 0745  NA 139 141  --  143 140 141 139 139  K 3.8 3.8  --  3.9 3.6 3.4* 3.5 3.6  CL 107 110  --   --  115* 111 109 110  CO2 21*  --   --   --  21* 21* 24 24  GLUCOSE 135* 133*  --   --  109* 96 102* 142*  BUN 21* 25*  --   --  13 12 10 14   CREATININE 0.98 1.00  --   --  0.79 0.92 0.89 0.91  CALCIUM 9.6  --   --   --  8.3* 8.4* 8.5* 8.7*  MG  --   --  2.3  --  2.1 2.1 1.9 2.2  PHOS  --   --   --   --  3.9 3.2 3.0  --    GFR: Estimated Creatinine Clearance: 95.8 mL/min (by C-G formula based on SCr of 0.91 mg/dL). Liver Function Tests: Recent Labs  Lab 02/01/24 1811  AST 20  ALT 76*  ALKPHOS 44  BILITOT 1.3*  PROT 7.6  ALBUMIN 4.0   No results for input(s): "LIPASE", "AMYLASE" in the last 168  hours. No results for input(s): "AMMONIA" in the last 168 hours. Coagulation Profile: Recent Labs  Lab 02/01/24 1811  INR 1.0   Cardiac Enzymes: No results for input(s): "CKTOTAL", "CKMB", "CKMBINDEX", "TROPONINI" in the last 168 hours. BNP (last 3 results) No results for input(s): "PROBNP" in the last 8760 hours. HbA1C: No results for input(s): "HGBA1C" in the last 72 hours. CBG: Recent Labs  Lab 02/01/24 1805  GLUCAP 129*   Lipid Profile: No results for input(s): "CHOL", "HDL", "LDLCALC", "TRIG", "CHOLHDL", "LDLDIRECT" in the last 72 hours.  Thyroid Function Tests: No results for input(s): "TSH", "T4TOTAL", "FREET4", "T3FREE", "THYROIDAB" in the last 72 hours. Anemia Panel: No results for input(s): "VITAMINB12", "FOLATE", "FERRITIN", "TIBC", "IRON", "RETICCTPCT" in the last 72 hours. Sepsis Labs: No results for input(s): "PROCALCITON", "LATICACIDVEN" in the last 168 hours.  Recent Results (from the past 240 hours)  Culture, blood (Routine X 2) w Reflex to ID Panel     Status: None   Collection Time: 02/01/24  9:22 PM   Specimen: BLOOD  Result Value Ref Range Status   Specimen Description BLOOD BLOOD RIGHT FOREARM  Final   Special Requests   Final    BOTTLES DRAWN AEROBIC AND ANAEROBIC Blood Culture results may not be optimal due to an inadequate volume of blood received in culture bottles   Culture   Final    NO GROWTH 5 DAYS Performed at [facility], [address] [place], [place] [address]    Report Status 02/06/2024 FINAL  Final  Culture, blood (Routine X 2) w Reflex to ID Panel     Status: None   Collection Time: 02/01/24  9:23 PM   Specimen: BLOOD  Result Value Ref Range Status   Specimen Description BLOOD BLOOD RIGHT ARM  Final   Special Requests   Final    BOTTLES DRAWN AEROBIC AND ANAEROBIC Blood Culture adequate volume   Culture   Final    NO GROWTH 5 DAYS Performed at [facility], [address] [place], [place] [address]    Report  Status 02/06/2024 FINAL  Final  MRSA Next Gen  by PCR, Nasal     Status: None   Collection Time: 02/01/24  9:59 PM   Specimen: Nasal Mucosa; Nasal Swab  Result Value Ref Range Status   MRSA by PCR Next Gen NOT DETECTED NOT DETECTED Final    Comment: (NOTE) The GeneXpert MRSA Assay (FDA approved for NASAL specimens only), is one component of a comprehensive MRSA colonization surveillance program. It is not intended to diagnose MRSA infection nor to guide or monitor treatment for MRSA infections. Test performance is not FDA approved in patients less than 73 years old. Performed at [facility], [address], [place], [place] [address]      Radiology Studies: No results found.  Scheduled Meds:  apixaban  10 mg Oral BID   Followed by   Melene Muller ON 02/11/2024] apixaban  5 mg Oral BID   atorvastatin  80 mg Oral Daily   Chlorhexidine Gluconate Cloth  6 each Topical Daily   feeding supplement  237 mL Oral BID BM   multivitamin with minerals  1 tablet Oral Daily   Continuous Infusions:   LOS: 6 days   Hughie Closs, MD [facility]  02/07/2024, 10:51 AM   *Please note that this is a verbal dictation therefore any spelling or grammatical errors are due to the "Dragon Medical One" system interpretation.  Please page via Amion and do not message via secure chat for urgent patient care matters. Secure chat can be used for non urgent patient care matters.  How to contact the [facility] Attending or Consulting provider 7A - 7P or covering provider during after hours 7P -7A, for this patient?  Check the care team in [facility] and look for a) attending/consulting TRH provider listed and b) the [facility] team listed. Page or secure chat 7A-7P. Log into [email or web address] and use [place]'s universal password to access. If you do not have the password, please contact the hospital operator. Locate the [facility] provider you are looking for under [facility] and page to a number that you can be directly  reached. If you still have difficulty reaching the provider, please page the [facility] (Director on Call) for the Hospitalists listed on amion for assistance.

## 2024-02-07 NOTE — Progress Notes (Signed)
 Called brother Memory Dance and updated on plan to send patient to CIR upon insurance authorization. No further questions at this time.

## 2024-02-08 DIAGNOSIS — G08 Intracranial and intraspinal phlebitis and thrombophlebitis: Secondary | ICD-10-CM | POA: Diagnosis not present

## 2024-02-08 LAB — GLUCOSE, CAPILLARY: Glucose-Capillary: 116 mg/dL — ABNORMAL HIGH (ref 70–99)

## 2024-02-08 LAB — CBC
HCT: 36.8 % — ABNORMAL LOW (ref 39.0–52.0)
Hemoglobin: 11.9 g/dL — ABNORMAL LOW (ref 13.0–17.0)
MCH: 26.8 pg (ref 26.0–34.0)
MCHC: 32.3 g/dL (ref 30.0–36.0)
MCV: 82.9 fL (ref 80.0–100.0)
Platelets: 250 K/uL (ref 150–400)
RBC: 4.44 MIL/uL (ref 4.22–5.81)
RDW: 12.8 % (ref 11.5–15.5)
WBC: 7.5 K/uL (ref 4.0–10.5)
nRBC: 0 % (ref 0.0–0.2)

## 2024-02-08 NOTE — Plan of Care (Signed)

## 2024-02-08 NOTE — Progress Notes (Signed)
 PROGRESS NOTE    Arjan Strohm  ZOX:[id number] DOB: [birth year] DOA: 02/01/2024 PCP: Pcp, No   Brief Narrative:  Samuel Warren is a 33 y.o. 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   Reportedly, patient was discharged 2-3 days ago on doxy after being found to have meningitis. His mother called him and stated he was not doing well and needed to go to hospital. She described he was unable to move or respond (per the brother).  He also reportedly complained of HA, N/V. Pt's family ultimately called EMS and code stroke initiated.  He was afebrile in the ED.  CTH in ed revealed multiple venous thrombi without acute hypodensity.  Admitted in ICU under critical care, neuroconsulted.   Significant Hospital Events: Including procedures, antibiotic start and stop dates in addition to other pertinent events   Readmitted to ICU 3/23 3/24 Left common carotid carotid arteriogram and transverse sinus sigmoid sinus with occluded posterior half of superior sagittal sinus and occluded dominant left transverse sinus, left sigmoid sinus and proximal internal jugular vein. 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  Assessment & Plan:   Principal Problem:   Acute cerebral venous sinus thrombosis Active Problems:   Dural venous sinus thrombosis  Acute cerebral venous thrombosis - likely 2/2 meningoencephalitis. Code Stroke CT head multiple thrombosed cerebral veins including the SSS, straight sinus, inferior sagittal sinus and both transverse sinuses left worse than right. No acute hypodensity is seen  CTA head & neck  Diffuse dural sinus thrombosis  CTV Extensive dural sinus thrombus within the posterior aspect of the superior sagittal sinus, the straight sinus, the  inferior sagittal sinus and left greater than right transverse sinus. Thrombus extends into the left sigmoid sinus and visualized left internal jugular vein. Innumerable enlarged and tortuous cortical veins are visualized, consistent with collateral venous drainage. Enlarged left petrosal veins are present in the middle cranial fossa Repeat CT head Diffuse hyperdensity throughout the major dural sinuses.  ssociated cerebral edema with diffuse loss of cortical sulcation and 7 mm of cerebellar tonsillar ectopia, stable. 2D Echo 3/18 EF 45 to 50%. Apical hypokinesis with overall mild LV dysfunction  LDL 279 HgbA1c 5.7 VTE prophylaxis -for an IV No antithrombotic prior to admission, now on Eliquis. 3/24 S/p revascularization of left internal jugular, left sigmoid sinus and left transverse sinus. Superior sagittal sinus remained occluded.  Neuro and neuro IR following.  Seen by PT OT who recommends CIR.  Patient is now cleared by neurology for discharge to continue Eliquis for 6 to 9 months.  Recent viral meningitis: Completed course of doxycycline.  Hyperlipidemia: On atorvastatin 80 mg PTA.  LDL to 79.  Goal<70.  Continue atorvastatin.   Deaf and communicates via [place] sign language. - Call [phone] M-F [phone], [phone] for after hours pager.   Constipation - resolved, BM overnight 3/25. - Colace, Dulcolax.   DVT prophylaxis: SCDs Start: 02/01/24 2045   Code Status: Full Code  Family Communication: Mother present at bedside.  Plan of care discussed with patient in length and he/she verbalized understanding and agreed with it.  Status is: Inpatient Remains inpatient appropriate because: Patient medically stable and cleared for discharge from neurostandpoint but needs CIR, awaiting placement.   Estimated body mass index is 25.62 kg/m as calculated from the following:   Height as  of this encounter: 5\' 4"  (1.626 m).   Weight as of this encounter: 67.7 kg.    Nutritional  Assessment: Body mass index is 25.62 kg/m.Marland Kitchen Seen by dietician.  I agree with the assessment and plan as outlined below: Nutrition Status: Nutrition Problem: Inadequate oral intake Etiology: inability to eat Signs/Symptoms: NPO status Interventions: Ensure Enlive (each supplement provides 350kcal and 20 grams of protein), MVI  . Skin Assessment: I have examined the patient's skin and I agree with the wound assessment as performed by the wound care RN as outlined below:    Consultants:  Neurology-signed off  Procedures:  As above  Antimicrobials:  Anti-infectives (From admission, onward)    Start     Dose/Rate Route Frequency Ordered Stop   02/02/24 1500  ceFAZolin (ANCEF) IVPB 2g/100 mL premix        2 g 200 mL/hr over 30 Minutes Intravenous  Once 02/02/24 1455 02/02/24 1835   02/01/24 2100  doxycycline (VIBRAMYCIN) 100 mg in sodium chloride 0.9 % 250 mL IVPB  Status:  Discontinued        100 mg 125 mL/hr over 120 Minutes Intravenous Every 12 hours 02/01/24 2052 02/02/24 0931         Subjective: Patient seen and examined.  His wife and his sister were at the bedside, his sister was able to help with interpretation today.  Patient's occipital pain is improving.  No other complaint.  He did not mention right lower extremity pain today.  Objective: Vitals:   02/08/24 0021 02/08/24 0327 02/08/24 0456 02/08/24 0743  BP: 94/68 (!) 96/59  101/69  Pulse: 62 66  (!) 56  Resp: 18 18  17   Temp:  98.4 F (36.9 C)  98.1 F (36.7 C)  TempSrc:  Oral  Oral  SpO2: 99% 97%  99%  Weight:   67.7 kg   Height:        Intake/Output Summary (Last 24 hours) at 02/08/2024 1029 Last data filed at 02/08/2024 0900 Gross per 24 hour  Intake 480 ml  Output 300 ml  Net 180 ml    Filed Weights   02/06/24 0500 02/07/24 0404 02/08/24 0456  Weight: 67.6 kg 67.4 kg 67.7 kg    Examination:  General exam: Appears calm and comfortable  Respiratory system: Clear to auscultation. Respiratory  effort normal. Cardiovascular system: S1 & S2 heard, RRR. No JVD, murmurs, rubs, gallops or clicks. No pedal edema. Gastrointestinal system: Abdomen is nondistended, soft and nontender. No organomegaly or masses felt. Normal bowel sounds heard. Central nervous system: Alert and oriented. No focal neurological deficits. Extremities: Symmetric 5 x 5 power. Skin: No rashes, lesions or ulcers.   Data Reviewed: I have personally reviewed following labs and imaging studies  CBC: Recent Labs  Lab 02/01/24 1811 02/01/24 1816 02/03/24 0505 02/04/24 0609 02/05/24 0628 02/06/24 0745 02/08/24 0648  WBC 14.1*   < > 10.3 7.6 6.4 7.4 7.5  NEUTROABS 11.3*  --  8.3*  --   --   --   --   HGB 16.1   < > 12.1* 11.1* 11.6* 12.9* 11.9*  HCT 50.3   < > 37.5* 34.9* 35.2* 40.2 36.8*  MCV 83.8   < > 83.0 83.7 83.0 82.9 82.9  PLT 260   < > 237 209 217 255 250   < > = values in this interval not displayed.   Basic Metabolic Panel: Recent Labs  Lab 02/01/24 1811 02/01/24 1816 02/02/24 1610 02/02/24 1957 02/03/24 0505 02/04/24 9604  02/05/24 0628 02/06/24 0745  NA 139 141  --  143 140 141 139 139  K 3.8 3.8  --  3.9 3.6 3.4* 3.5 3.6  CL 107 110  --   --  115* 111 109 110  CO2 21*  --   --   --  21* 21* 24 24  GLUCOSE 135* 133*  --   --  109* 96 102* 142*  BUN 21* 25*  --   --  13 12 10 14   CREATININE 0.98 1.00  --   --  0.79 0.92 0.89 0.91  CALCIUM 9.6  --   --   --  8.3* 8.4* 8.5* 8.7*  MG  --   --  2.3  --  2.1 2.1 1.9 2.2  PHOS  --   --   --   --  3.9 3.2 3.0  --    GFR: Estimated Creatinine Clearance: 95.8 mL/min (by C-G formula based on SCr of 0.91 mg/dL). Liver Function Tests: Recent Labs  Lab 02/01/24 1811  AST 20  ALT 76*  ALKPHOS 44  BILITOT 1.3*  PROT 7.6  ALBUMIN 4.0   No results for input(s): "LIPASE", "AMYLASE" in the last 168 hours. No results for input(s): "AMMONIA" in the last 168 hours. Coagulation Profile: Recent Labs  Lab 02/01/24 1811  INR 1.0   Cardiac  Enzymes: No results for input(s): "CKTOTAL", "CKMB", "CKMBINDEX", "TROPONINI" in the last 168 hours. BNP (last 3 results) No results for input(s): "PROBNP" in the last 8760 hours. HbA1C: No results for input(s): "HGBA1C" in the last 72 hours. CBG: Recent Labs  Lab 02/01/24 1805 02/08/24 0330  GLUCAP 129* 116*   Lipid Profile: No results for input(s): "CHOL", "HDL", "LDLCALC", "TRIG", "CHOLHDL", "LDLDIRECT" in the last 72 hours.  Thyroid Function Tests: No results for input(s): "TSH", "T4TOTAL", "FREET4", "T3FREE", "THYROIDAB" in the last 72 hours. Anemia Panel: No results for input(s): "VITAMINB12", "FOLATE", "FERRITIN", "TIBC", "IRON", "RETICCTPCT" in the last 72 hours. Sepsis Labs: No results for input(s): "PROCALCITON", "LATICACIDVEN" in the last 168 hours.  Recent Results (from the past 240 hours)  Culture, blood (Routine X 2) w Reflex to ID Panel     Status: None   Collection Time: 02/01/24  9:22 PM   Specimen: BLOOD  Result Value Ref Range Status   Specimen Description BLOOD BLOOD RIGHT FOREARM  Final   Special Requests   Final    BOTTLES DRAWN AEROBIC AND ANAEROBIC Blood Culture results may not be optimal due to an inadequate volume of blood received in culture bottles   Culture   Final    NO GROWTH 5 DAYS Performed at [facility], [address] [place], [place] [address]    Report Status 02/06/2024 FINAL  Final  Culture, blood (Routine X 2) w Reflex to ID Panel     Status: None   Collection Time: 02/01/24  9:23 PM   Specimen: BLOOD  Result Value Ref Range Status   Specimen Description BLOOD BLOOD RIGHT ARM  Final   Special Requests   Final    BOTTLES DRAWN AEROBIC AND ANAEROBIC Blood Culture adequate volume   Culture   Final    NO GROWTH 5 DAYS Performed at [facility], [address] [place], [place] [address]    Report Status 02/06/2024 FINAL  Final  MRSA Next Gen by PCR, Nasal     Status: None   Collection Time: 02/01/24  9:59 PM    Specimen: Nasal Mucosa;  Nasal Swab  Result Value Ref Range Status   MRSA by PCR Next Gen NOT DETECTED NOT DETECTED Final    Comment: (NOTE) The GeneXpert MRSA Assay (FDA approved for NASAL specimens only), is one component of a comprehensive MRSA colonization surveillance program. It is not intended to diagnose MRSA infection nor to guide or monitor treatment for MRSA infections. Test performance is not FDA approved in patients less than 83 years old. Performed at [facility], [address], [place], [place] [address]      Radiology Studies: No results found.  Scheduled Meds:  apixaban  10 mg Oral BID   Followed by   Melene Muller ON 02/11/2024] apixaban  5 mg Oral BID   atorvastatin  80 mg Oral Daily   Chlorhexidine Gluconate Cloth  6 each Topical Daily   feeding supplement  237 mL Oral BID BM   multivitamin with minerals  1 tablet Oral Daily   Continuous Infusions:   LOS: 7 days   Hughie Closs, MD [facility]  02/08/2024, 10:29 AM   *Please note that this is a verbal dictation therefore any spelling or grammatical errors are due to the "Dragon Medical One" system interpretation.  Please page via Amion and do not message via secure chat for urgent patient care matters. Secure chat can be used for non urgent patient care matters.  How to contact the [facility] Attending or Consulting provider 7A - 7P or covering provider during after hours 7P -7A, for this patient?  Check the care team in [facility] and look for a) attending/consulting TRH provider listed and b) the [facility] team listed. Page or secure chat 7A-7P. Log into [email or web address] and use [place]'s universal password to access. If you do not have the password, please contact the hospital operator. Locate the [facility] provider you are looking for under [facility] and page to a number that you can be directly reached. If you still have difficulty reaching the provider, please page the [facility] (Director on Call) for the  Hospitalists listed on amion for assistance.

## 2024-02-08 NOTE — Plan of Care (Signed)
  Problem: Education: Goal: Knowledge of General Education information will improve Description: Including pain rating scale, medication(s)/side effects and non-pharmacologic comfort measures Outcome: Progressing   Problem: Clinical Measurements: Goal: Will remain free from infection Outcome: Progressing Goal: Diagnostic test results will improve Outcome: Progressing   Problem: Activity: Goal: Risk for activity intolerance will decrease Outcome: Progressing   Problem: Nutrition: Goal: Adequate nutrition will be maintained Outcome: Progressing   Problem: Coping: Goal: Level of anxiety will decrease Outcome: Progressing   Problem: Elimination: Goal: Will not experience complications related to bowel motility Outcome: Progressing Goal: Will not experience complications related to urinary retention Outcome: Progressing   Problem: Pain Managment: Goal: General experience of comfort will improve and/or be controlled Outcome: Progressing   Problem: Safety: Goal: Ability to remain free from injury will improve Outcome: Progressing   Problem: Skin Integrity: Goal: Risk for impaired skin integrity will decrease Outcome: Progressing   Problem: Activity: Goal: Ability to return to baseline activity level will improve Outcome: Progressing   Problem: Health Behavior/Discharge Planning: Goal: Ability to safely manage health-related needs after discharge will improve Outcome: Progressing

## 2024-02-08 NOTE — Plan of Care (Signed)
 Awaiting bed in CIR, ASL and [place] interpreter present at bedside for patient and mother.    Problem: Nutrition: Goal: Adequate nutrition will be maintained Outcome: Not Met (add Reason)   Problem: Coping: Goal: Level of anxiety will decrease Outcome: Not Met (add Reason)   Problem: Pain Managment: Goal: General experience of comfort will improve and/or be controlled Outcome: Not Met (add Reason)   Problem: Elimination: Goal: Will not experience complications related to bowel motility Outcome: Not Met (add Reason) Goal: Will not experience complications related to urinary retention Outcome: Not Met (add Reason)   Problem: Education: Goal: Understanding of CV disease, CV risk reduction, and recovery process will improve Outcome: Not Met (add Reason)

## 2024-02-09 DIAGNOSIS — G08 Intracranial and intraspinal phlebitis and thrombophlebitis: Secondary | ICD-10-CM | POA: Diagnosis not present

## 2024-02-09 LAB — CBC
HCT: 38.1 % — ABNORMAL LOW (ref 39.0–52.0)
Hemoglobin: 12.2 g/dL — ABNORMAL LOW (ref 13.0–17.0)
MCH: 27 pg (ref 26.0–34.0)
MCHC: 32 g/dL (ref 30.0–36.0)
MCV: 84.3 fL (ref 80.0–100.0)
Platelets: 147 10*3/uL — ABNORMAL LOW (ref 150–400)
RBC: 4.52 MIL/uL (ref 4.22–5.81)
RDW: 12.9 % (ref 11.5–15.5)
WBC: 7.9 10*3/uL (ref 4.0–10.5)
nRBC: 0 % (ref 0.0–0.2)

## 2024-02-09 NOTE — Progress Notes (Signed)
 Inpatient Rehab Admissions Coordinator:    I spoke with Pt.'s brother and let him know that I was informed that Pt.'s insurance terms today. He says he will call HR to see if he can get it reinstated and to wait on CIR admit for now.  Megan Salon, MS, CCC-SLP Rehab Admissions Coordinator  [phone] (celll) [phone] (office)

## 2024-02-09 NOTE — Progress Notes (Signed)
 PROGRESS NOTE    Samuel Warren  ZOX:[id number] DOB: 1991/03/15 DOA: 02/01/2024 PCP: Pcp, No   Brief Narrative:  Samuel Warren is a 33 y.o. 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   Reportedly, patient was discharged 2-3 days ago on doxy after being found to have meningitis. His mother called him and stated he was not doing well and needed to go to hospital. She described he was unable to move or respond (per the brother).  He also reportedly complained of HA, N/V. Pt's family ultimately called EMS and code stroke initiated.  He was afebrile in the ED.  CTH in ed revealed multiple venous thrombi without acute hypodensity.  Admitted in ICU under critical care, neuroconsulted.   Significant Hospital Events: Including procedures, antibiotic start and stop dates in addition to other pertinent events   Readmitted to ICU 3/23 3/24 Left common carotid carotid arteriogram and transverse sinus sigmoid sinus with occluded posterior half of superior sagittal sinus and occluded dominant left transverse sinus, left sigmoid sinus and proximal internal jugular vein. 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  Assessment & Plan:   Principal Problem:   Acute cerebral venous sinus thrombosis Active Problems:   Dural venous sinus thrombosis  Acute cerebral venous thrombosis - likely 2/2 meningoencephalitis. Code Stroke CT head multiple thrombosed cerebral veins including the SSS, straight sinus, inferior sagittal sinus and both transverse sinuses left worse than right. No acute hypodensity is seen  CTA head & neck  Diffuse dural sinus thrombosis  CTV Extensive dural sinus thrombus within the posterior aspect of the superior sagittal sinus, the straight sinus, the  inferior sagittal sinus and left greater than right transverse sinus. Thrombus extends into the left sigmoid sinus and visualized left internal jugular vein. Innumerable enlarged and tortuous cortical veins are visualized, consistent with collateral venous drainage. Enlarged left petrosal veins are present in the middle cranial fossa Repeat CT head Diffuse hyperdensity throughout the major dural sinuses.  ssociated cerebral edema with diffuse loss of cortical sulcation and 7 mm of cerebellar tonsillar ectopia, stable. 2D Echo 3/18 EF 45 to 50%. Apical hypokinesis with overall mild LV dysfunction  LDL 279 HgbA1c 5.7 VTE prophylaxis -for an IV No antithrombotic prior to admission, now on Eliquis. 3/24 S/p revascularization of left internal jugular, left sigmoid sinus and left transverse sinus. Superior sagittal sinus remained occluded.  Neuro and neuro IR following.  Seen by PT OT who recommends CIR.  Patient is now cleared by neurology for discharge to continue Eliquis for 6 to 9 months.  Recent viral meningitis: Completed course of doxycycline.  Hyperlipidemia: On atorvastatin 80 mg PTA.  LDL to 79.  Goal<70.  Continue atorvastatin.   Deaf and communicates via [place] sign language. - Call [phone] M-F [phone], [phone] for after hours pager.   Constipation - resolved, BM overnight 3/25. - Colace, Dulcolax.   DVT prophylaxis: SCDs Start: 02/01/24 2045   Code Status: Full Code  Family Communication: Mother present at bedside.  Plan of care discussed with patient in length and he/she verbalized understanding and agreed with it.  Status is: Inpatient Remains inpatient appropriate because: Patient medically stable and cleared for discharge from neurostandpoint but needs CIR, awaiting placement.   Estimated body mass index is 25.73 kg/m as calculated from the following:   Height as  of this encounter: 5\' 4"  (1.626 m).   Weight as of this encounter: 68 kg.    Nutritional  Assessment: Body mass index is 25.73 kg/m.Marland Kitchen Seen by dietician.  I agree with the assessment and plan as outlined below: Nutrition Status: Nutrition Problem: Inadequate oral intake Etiology: inability to eat Signs/Symptoms: NPO status Interventions: Ensure Enlive (each supplement provides 350kcal and 20 grams of protein), MVI  . Skin Assessment: I have examined the patient's skin and I agree with the wound assessment as performed by the wound care RN as outlined below:    Consultants:  Neurology-signed off  Procedures:  As above  Antimicrobials:  Anti-infectives (From admission, onward)    Start     Dose/Rate Route Frequency Ordered Stop   02/02/24 1500  ceFAZolin (ANCEF) IVPB 2g/100 mL premix        2 g 200 mL/hr over 30 Minutes Intravenous  Once 02/02/24 1455 02/02/24 1835   02/01/24 2100  doxycycline (VIBRAMYCIN) 100 mg in sodium chloride 0.9 % 250 mL IVPB  Status:  Discontinued        100 mg 125 mL/hr over 120 Minutes Intravenous Every 12 hours 02/01/24 2052 02/02/24 0931         Subjective: Patient seen and examined.  No new complaint.  Occipital pain improving.  Objective: Vitals:   02/09/24 0218 02/09/24 0357 02/09/24 0600 02/09/24 0834  BP:  97/64  96/60  Pulse:  69  73  Resp:  16 15 18   Temp:  98.1 F (36.7 C)  98.2 F (36.8 C)  TempSrc:  Oral  Oral  SpO2:  97% 96% 97%  Weight: 68 kg     Height:        Intake/Output Summary (Last 24 hours) at 02/09/2024 1319 Last data filed at 02/09/2024 1300 Gross per 24 hour  Intake 1492 ml  Output --  Net 1492 ml    Filed Weights   02/07/24 0404 02/08/24 0456 02/09/24 0218  Weight: 67.4 kg 67.7 kg 68 kg    Examination:  General exam: Appears calm and comfortable  Respiratory system: Clear to auscultation. Respiratory effort normal. Cardiovascular system: S1 & S2 heard, RRR. No JVD, murmurs, rubs, gallops or clicks. No pedal edema. Gastrointestinal system: Abdomen is nondistended, soft and nontender. No  organomegaly or masses felt. Normal bowel sounds heard. Central nervous system: Alert and oriented. No focal neurological deficits. Extremities: Symmetric 5 x 5 power. Skin: No rashes, lesions or ulcers.   Data Reviewed: I have personally reviewed following labs and imaging studies  CBC: Recent Labs  Lab 02/03/24 0505 02/04/24 0609 02/05/24 0628 02/06/24 0745 02/08/24 0648 02/09/24 0630  WBC 10.3 7.6 6.4 7.4 7.5 7.9  NEUTROABS 8.3*  --   --   --   --   --   HGB 12.1* 11.1* 11.6* 12.9* 11.9* 12.2*  HCT 37.5* 34.9* 35.2* 40.2 36.8* 38.1*  MCV 83.0 83.7 83.0 82.9 82.9 84.3  PLT 237 209 217 255 250 147*   Basic Metabolic Panel: Recent Labs  Lab 02/02/24 1957 02/03/24 0505 02/04/24 0609 02/05/24 0628 02/06/24 0745  NA 143 140 141 139 139  K 3.9 3.6 3.4* 3.5 3.6  CL  --  115* 111 109 110  CO2  --  21* 21* 24 24  GLUCOSE  --  109* 96 102* 142*  BUN  --  13 12 10 14   CREATININE  --  0.79 0.92 0.89 0.91  CALCIUM  --  8.3* 8.4* 8.5* 8.7*  MG  --  2.1 2.1 1.9 2.2  PHOS  --  3.9 3.2 3.0  --    GFR: Estimated Creatinine Clearance: 95.8 mL/min (by C-G formula based on SCr of 0.91 mg/dL). Liver Function Tests: No results for input(s): "AST", "ALT", "ALKPHOS", "BILITOT", "PROT", "ALBUMIN" in the last 168 hours.  No results for input(s): "LIPASE", "AMYLASE" in the last 168 hours. No results for input(s): "AMMONIA" in the last 168 hours. Coagulation Profile: No results for input(s): "INR", "PROTIME" in the last 168 hours.  Cardiac Enzymes: No results for input(s): "CKTOTAL", "CKMB", "CKMBINDEX", "TROPONINI" in the last 168 hours. BNP (last 3 results) No results for input(s): "PROBNP" in the last 8760 hours. HbA1C: No results for input(s): "HGBA1C" in the last 72 hours. CBG: Recent Labs  Lab 02/08/24 0330  GLUCAP 116*   Lipid Profile: No results for input(s): "CHOL", "HDL", "LDLCALC", "TRIG", "CHOLHDL", "LDLDIRECT" in the last 72 hours.  Thyroid Function Tests: No  results for input(s): "TSH", "T4TOTAL", "FREET4", "T3FREE", "THYROIDAB" in the last 72 hours. Anemia Panel: No results for input(s): "VITAMINB12", "FOLATE", "FERRITIN", "TIBC", "IRON", "RETICCTPCT" in the last 72 hours. Sepsis Labs: No results for input(s): "PROCALCITON", "LATICACIDVEN" in the last 168 hours.  Recent Results (from the past 240 hours)  Culture, blood (Routine X 2) w Reflex to ID Panel     Status: None   Collection Time: 02/01/24  9:22 PM   Specimen: BLOOD  Result Value Ref Range Status   Specimen Description BLOOD BLOOD RIGHT FOREARM  Final   Special Requests   Final    BOTTLES DRAWN AEROBIC AND ANAEROBIC Blood Culture results may not be optimal due to an inadequate volume of blood received in culture bottles   Culture   Final    NO GROWTH 5 DAYS Performed at [facility], [address], [place], [place] [address]    Report Status 02/06/2024 FINAL  Final  Culture, blood (Routine X 2) w Reflex to ID Panel     Status: None   Collection Time: 02/01/24  9:23 PM   Specimen: BLOOD  Result Value Ref Range Status   Specimen Description BLOOD BLOOD RIGHT ARM  Final   Special Requests   Final    BOTTLES DRAWN AEROBIC AND ANAEROBIC Blood Culture adequate volume   Culture   Final    NO GROWTH 5 DAYS Performed at [facility], [address], [place], [place] [address]    Report Status 02/06/2024 FINAL  Final  MRSA Next Gen by PCR, Nasal     Status: None   Collection Time: 02/01/24  9:59 PM   Specimen: Nasal Mucosa; Nasal Swab  Result Value Ref Range Status   MRSA by PCR Next Gen NOT DETECTED NOT DETECTED Final    Comment: (NOTE) The GeneXpert MRSA Assay (FDA approved for NASAL specimens only), is one component of a comprehensive MRSA colonization surveillance program. It is not intended to diagnose MRSA infection nor to guide or monitor treatment for MRSA infections. Test performance is not FDA approved in patients less than 86 years old. Performed at [facility], [address], [place], [place] [address]      Radiology Studies: No results found.  Scheduled Meds:  apixaban  10 mg Oral BID   Followed by   Melene Muller ON 02/11/2024] apixaban  5 mg Oral BID   atorvastatin  80 mg Oral Daily   feeding supplement  237 mL Oral BID BM   multivitamin with minerals  1 tablet  Oral Daily   Continuous Infusions:   LOS: 8 days   Hughie Closs, MD [facility]  02/09/2024, 1:19 PM   *Please note that this is a verbal dictation therefore any spelling or grammatical errors are due to the "Dragon Medical One" system interpretation.  Please page via Amion and do not message via secure chat for urgent patient care matters. Secure chat can be used for non urgent patient care matters.  How to contact the [facility] Attending or Consulting provider 7A - 7P or covering provider during after hours 7P -7A, for this patient?  Check the care team in [facility] and look for a) attending/consulting TRH provider listed and b) the [facility] team listed. Page or secure chat 7A-7P. Log into [email or web address] and use [facility] universal password to access. If you do not have the password, please contact the hospital operator. Locate the [facility] provider you are looking for under [facility] and page to a number that you can be directly reached. If you still have difficulty reaching the provider, please page the [facility] (Director on Call) for the Hospitalists listed on amion for assistance.

## 2024-02-09 NOTE — Progress Notes (Signed)
 Received call from patient's family friend [name], she advised she had spoke with patient's brother and was informed of concern about current status of patient's insurance.  Per [name], patient works for [employer] and it is her understanding that there is a Web designer through this employer if there is an issue with the first insurance.  [name] advised she was going to reach out to patient's employer and try to obtain this information to assist patient and his brother.

## 2024-02-09 NOTE — Progress Notes (Addendum)
 Occupational Therapy Treatment Patient Details Name: Samuel Warren MRN: [id number] DOB: [birth year] Today's Date: 02/09/2024   History of present illness Pt admitted 3/23 with right sided weakness.  CT head revealed multiple thrombosed cerebral veins including the SSS, straight sinus, inferior sagittal sinus and both transverse sinuses left worse than right.  Thrombus extended into the left sigmoid sinus and visualized left internal jugular vein. Innumerable enlarged and tortuous cortical veins are visualized, consistent with collateral venous drainage. Enlarged left petrosal veins are present in the middle cranial fossa.  Pt underwent mechanical thrombectomy for clot extraction by Dr. Corliss Skains 3/24.  He was able to open up left transverse and sigmoid sinuses but the clot was quite thick and he was unable to the superior sagittal sinus and transverse and sigmoid sinus on the right.  PMH: deaf, recent admission to the hospital from 3/16-3/20 for acute meningoencephalitis which was treated with antibiotics, TIA, hyperlipidemia   OT comments  Video interpreter ([place] used throughout session with wife communicating with her husband using some sign language and some gestures. Pt apparently complaining of mild dizziness and R sided weakness/numbness however able to ambulate ( @ 150 ft) and complete ADL tasks with S of wife @ RW level. Educated on RW safety and use of 3in1 as a shower seat for bathing. Pt/wife indicated understanding. Wife states her husband is doing better this admission than when he discharged home last admission and feels comfortable taking him home. Wife states she can be with him at all times after DC and indicated understanding of need for direct S with mobility (issued gait belt). Recommend follow up with OT at the neuro outpt center.       If plan is discharge home, recommend the following:  A little help with bathing/dressing/bathroom;Assistance with cooking/housework;Assist for  transportation;Help with stairs or ramp for entrance;A little help with walking and/or transfers   Equipment Recommendations  BSC/3in1    Recommendations for Other Services      Precautions / Restrictions Precautions Precautions: Fall Recall of Precautions/Restrictions: Intact Restrictions Weight Bearing Restrictions Per Provider Order: No       Mobility Bed Mobility Overal bed mobility: Modified Independent                  Transfers Overall transfer level: Needs assistance Equipment used: None Transfers: Sit to/from Stand Sit to Stand: Supervision                 Balance Overall balance assessment: Needs assistance Sitting-balance support: No upper extremity supported, Feet supported Sitting balance-Leahy Scale: Good     Standing balance support: Single extremity supported, During functional activity Standing balance-Leahy Scale: Fair                             ADL either performed or assessed with clinical judgement   ADL Overall ADL's : Needs assistance/impaired     Grooming: Sitting;Independent   Upper Body Bathing: Supervision/ safety;Set up;Sitting   Lower Body Bathing: Sit to/from stand;Supervison/ safety   Upper Body Dressing : Sitting;Set up   Lower Body Dressing: Sit to/from stand;Supervision/safety   Toilet Transfer: Cueing for safety;Contact guard assist;Rolling walker (2 wheels)   Toileting- Clothing Manipulation and Hygiene: Modified independent       Functional mobility during ADLs: Supervision/safety;Rolling walker (2 wheels);Cueing for safety (AT TIMES) General ADL Comments: Overall at a setup/S level due to multiple lines and Min A-Min A +2 for ambulation; feel he will  progress quickly once his pain gets better without need for follow up A    Extremity/Trunk Assessment Upper Extremity Assessment RUE Deficits / Details: appears WFL however appears to be complaining of numbness; will further assess   Lower  Extremity Assessment RLE Deficits / Details: overall moving funcitonally; unsure if numb as pt pointing to RUE RLE Sensation: decreased light touch LLE Deficits / Details: grossly 4/5        Vision       Perception     Praxis     Communication Communication Communication: Impaired Factors Affecting Communication: Hearing impaired   Cognition Arousal: Alert Behavior During Therapy: WFL for tasks assessed/performed Cognition: Difficult to assess Difficult to assess due to: Non-English speaking           OT - Cognition Comments: most likely close to baseline; following instructions without diffiuclty form wife                 Following commands: Impaired Following commands impaired: Follows one step commands with increased time (difficult to assess)      Cueing   Cueing Techniques: Gestural cues, Tactile cues, Visual cues  Exercises      Shoulder Instructions       General Comments      Pertinent Vitals/ Pain       Pain Assessment Pain Assessment: Faces Faces Pain Scale: Hurts even more Pain Location: headache Pain Descriptors / Indicators: Discomfort, Grimacing Pain Intervention(s): Limited activity within patient's tolerance  Home Living                                          Prior Functioning/Environment              Frequency  Min 2X/week        Progress Toward Goals  OT Goals(current goals can now be found in the care plan section)  Progress towards OT goals: Progressing toward goals  Acute Rehab OT Goals Patient Stated Goal: to get better OT Goal Formulation: With patient/family Time For Goal Achievement: 02/19/24 Potential to Achieve Goals: Good ADL Goals Pt Will Perform Lower Body Bathing: with supervision;sit to/from stand Pt Will Perform Lower Body Dressing: with supervision;sit to/from stand Pt Will Transfer to Toilet: with supervision;ambulating Pt Will Perform Toileting - Clothing Manipulation and  hygiene: with modified independence  Plan      Co-evaluation                 AM-PAC OT "6 Clicks" Daily Activity     Outcome Measure   Help from another person eating meals?: None Help from another person taking care of personal grooming?: A Little Help from another person toileting, which includes using toliet, bedpan, or urinal?: A Little Help from another person bathing (including washing, rinsing, drying)?: A Little Help from another person to put on and taking off regular upper body clothing?: A Little Help from another person to put on and taking off regular lower body clothing?: A Little 6 Click Score: 19    End of Session Equipment Utilized During Treatment: Gait belt  OT Visit Diagnosis: Unsteadiness on feet (R26.81);Repeated falls (R29.6) Pain - part of body:  (UNSURE)   Activity Tolerance Patient tolerated treatment well   Patient Left in bed;with call bell/phone within reach;with bed alarm set;with family/visitor present   Nurse Communication Mobility status  Time: 1410-1505 OT Time Calculation (min): 55 min  Charges: OT General Charges $OT Visit: 1 Visit OT Treatments $Self Care/Home Management : 38-52 mins  Luisa Dago, OT/L   Acute OT Clinical Specialist Acute Rehabilitation Services Pager [phone] Office [phone]   [facility] 02/09/2024, 4:46 PM

## 2024-02-09 NOTE — TOC Progression Note (Signed)
 Transition of Care [facility]) - Progression Note    Patient Details  Name: [name] MRN: [id number] Date of Birth: [birth year]  [facility] CM/SW Contact  Kermit Balo, RN Phone Number: 02/09/2024, 3:22 PM  Clinical Narrative:     Brother is working on [employer] for CIR admission. TOC following.  Expected Discharge Plan: IP Rehab Facility Barriers to Discharge: Continued Medical Work up  Expected Discharge Plan and Services   Discharge Planning Services: CM Consult Post Acute Care Choice: IP Rehab Living arrangements for the past 2 months: Single Family Home                                       Social Determinants of Health (SDOH) Interventions SDOH Screenings   Food Insecurity: No Food Insecurity (02/02/2024)  Housing: Low Risk  (02/02/2024)  Transportation Needs: No Transportation Needs (02/02/2024)  Utilities: Not At Risk (02/02/2024)  Social Connections: Patient Declined (02/08/2024)  Tobacco Use: Low Risk  (02/02/2024)    Readmission Risk Interventions    01/29/2024   11:53 AM  Readmission Risk Prevention Plan  Post Dischage Appt Complete  Medication Screening Complete  Transportation Screening Complete

## 2024-02-09 NOTE — Progress Notes (Signed)
 Occupational Therapy Treatment Note  Pt seen for additional session for stair training to facilitate safe DC home. Wife able to complete with S using rails and gait belt. No knee buckling noted. Discussed with MD and Nsg.     02/09/24 1652  OT Visit Information  Last OT Received On 02/09/24  Assistance Needed +1  History of Present Illness Pt admitted 3/23 with right sided weakness.  CT head revealed multiple thrombosed cerebral veins including the SSS, straight sinus, inferior sagittal sinus and both transverse sinuses left worse than right.  Thrombus extended into the left sigmoid sinus and visualized left internal jugular vein. Innumerable enlarged and tortuous cortical veins are visualized, consistent with collateral venous drainage. Enlarged left petrosal veins are present in the middle cranial fossa.  Pt underwent mechanical thrombectomy for clot extraction by Dr. Corliss Skains 3/24.  He was able to open up left transverse and sigmoid sinuses but the clot was quite thick and he was unable to the superior sagittal sinus and transverse and sigmoid sinus on the right.  PMH: deaf, recent admission to the hospital from 3/16-3/20 for acute meningoencephalitis which was treated with antibiotics, TIA, hyperlipidemia  Precautions  Precautions Fall  Recall of Precautions/Restrictions Intact  Pain Assessment  Pain Assessment Faces  Faces Pain Scale 4  Pain Location headache  Pain Descriptors / Indicators Discomfort;Grimacing  Pain Intervention(s) Limited activity within patient's tolerance  Cognition  Arousal Alert  Behavior During Therapy [facility] for tasks assessed/performed  Cognition No apparent impairments  Upper Extremity Assessment  Upper Extremity Assessment Right hand dominant  RUE Deficits / Details using R hand funcitonally without diffuclty during ADL tasks  General Comments  General comments (skin integrity, edema, etc.) completed stairs (at least 10) using rail. Pt states his R leg felt  "weak"; taught to go up stiars with the strong leg and down with the weaker leg. Wife able to copmlete stairs with her husband usging gait belt.  OT - End of Session  Equipment Utilized During Treatment Gait belt;Rolling walker (2 wheels)  Activity Tolerance Patient tolerated treatment well  Patient left in bed;with call bell/phone within reach;with family/visitor present  Nurse Communication Mobility status;Other (comment) (DC needs)  OT Assessment/Plan  OT Visit Diagnosis Unsteadiness on feet (R26.81);Repeated falls (R29.6)  OT Frequency (ACUTE ONLY) Min 2X/week  Follow Up Recommendations Outpatient OT  Patient can return home with the following A little help with bathing/dressing/bathroom;Assistance with cooking/housework;Assist for transportation;Help with stairs or ramp for entrance;A little help with walking and/or transfers  OT Equipment BSC/3in1  AM-PAC OT "6 Clicks" Daily Activity Outcome Measure (Version 2)  Help from another person eating meals? 4  Help from another person taking care of personal grooming? 3  Help from another person toileting, which includes using toliet, bedpan, or urinal? 3  Help from another person bathing (including washing, rinsing, drying)? 3  Help from another person to put on and taking off regular upper body clothing? 3  Help from another person to put on and taking off regular lower body clothing? 3  6 Click Score 19  Progressive Mobility  What is the highest level of mobility based on the progressive mobility assessment? Level 5 (Walks with assist in room/hall) - Balance while stepping forward/back and can walk in room with assist - Complete  Activity Ambulated with assistance in hallway  OT Goal Progression  Progress towards OT goals Progressing toward goals  Acute Rehab OT Goals  Patient Stated Goal to go home  OT Goal Formulation With  patient/family  Time For Goal Achievement 02/19/24  Potential to Achieve Goals Good  ADL Goals  Pt Will  Perform Lower Body Bathing with supervision;sit to/from stand  Pt Will Perform Lower Body Dressing with supervision;sit to/from stand  Pt Will Transfer to Toilet with supervision;ambulating  Pt Will Perform Toileting - Clothing Manipulation and hygiene with modified independence  OT Time Calculation  OT Start Time (ACUTE ONLY) 1520  OT Stop Time (ACUTE ONLY) 1544  OT Time Calculation (min) 24 min  OT General Charges  $OT Visit 1 Visit  OT Treatments  $Self Care/Home Management  23-37 mins   Luisa Dago, OT/L   Acute OT Clinical Specialist Acute Rehabilitation Services Pager [phone] Office [phone]

## 2024-02-10 ENCOUNTER — Other Ambulatory Visit (HOSPITAL_COMMUNITY): Payer: Self-pay

## 2024-02-10 DIAGNOSIS — E785 Hyperlipidemia, unspecified: Secondary | ICD-10-CM | POA: Insufficient documentation

## 2024-02-10 DIAGNOSIS — G08 Intracranial and intraspinal phlebitis and thrombophlebitis: Secondary | ICD-10-CM | POA: Diagnosis not present

## 2024-02-10 LAB — CBC
HCT: 38.1 % — ABNORMAL LOW (ref 39.0–52.0)
Hemoglobin: 12.4 g/dL — ABNORMAL LOW (ref 13.0–17.0)
MCH: 27.1 pg (ref 26.0–34.0)
MCHC: 32.5 g/dL (ref 30.0–36.0)
MCV: 83.2 fL (ref 80.0–100.0)
Platelets: 257 10*3/uL (ref 150–400)
RBC: 4.58 MIL/uL (ref 4.22–5.81)
RDW: 13.1 % (ref 11.5–15.5)
WBC: 7.6 10*3/uL (ref 4.0–10.5)
nRBC: 0 % (ref 0.0–0.2)

## 2024-02-10 LAB — BASIC METABOLIC PANEL WITH GFR
Anion gap: 12 (ref 5–15)
BUN: 16 mg/dL (ref 6–20)
CO2: 23 mmol/L (ref 22–32)
Calcium: 9.6 mg/dL (ref 8.9–10.3)
Chloride: 105 mmol/L (ref 98–111)
Creatinine, Ser: 0.87 mg/dL (ref 0.61–1.24)
GFR, Estimated: >60 mL/min (ref >60)
Glucose, Bld: 142 mg/dL — ABNORMAL HIGH (ref 70–99)
Potassium: 3.9 mmol/L (ref 3.5–5.1)
Sodium: 140 mmol/L (ref 135–145)

## 2024-02-10 MED ORDER — APIXABAN 5 MG PO TABS
5.0000 mg | ORAL_TABLET | Freq: Two times a day (BID) | ORAL | 0 refills | Status: DC
  Filled 2024-02-10: qty 60, 30d supply, fill #0

## 2024-02-10 MED ORDER — ATORVASTATIN CALCIUM 80 MG PO TABS
80.0000 mg | ORAL_TABLET | Freq: Every day | ORAL | 0 refills | Status: DC
  Filled 2024-02-10: qty 30, 30d supply, fill #0

## 2024-02-10 NOTE — Progress Notes (Signed)
    Durable Medical Equipment  (From admission, onward)           Start     Ordered   02/10/24 1125  For home use only DME Bedside commode  Once       Question:  Patient needs a bedside commode to treat with the following condition  Answer:  Stroke [facility])   02/10/24 1124   02/10/24 1124  For home use only DME Walker rolling  Once       Question Answer Comment  Walker: With 5 Inch Wheels   Patient needs a walker to treat with the following condition Stroke (HCC)      02/10/24 1124            The patient is confined to one level of the home environment and there is no toilet on the that level of the home.

## 2024-02-10 NOTE — TOC Transition Note (Signed)
 Transition of Care [facility]) - Discharge Note   Patient Details  Name: Samuel Warren MRN: [id number] Date of Birth: [birth year]  [facility]) CM/SW Contact:  Kermit Balo, RN Phone Number: 02/10/2024, 11:22 AM   Clinical Narrative:     Pt is discharging home with family. Outpatient therapy arranged with [facility]. Information on the AVS.  PCP appt placed on AVS for next week.  DME: walker/ BSC delivered to the room from [employer].  Family to transport home.  Final next level of care: OP Rehab Barriers to Discharge: No Barriers Identified   Patient Goals and CMS Choice   CMS Medicare.gov Compare Post Acute Care list provided to:: Patient Choice offered to / list presented to : Patient, Parent      Discharge Placement                       Discharge Plan and Services Additional resources added to the After Visit Summary for     Discharge Planning Services: CM Consult Post Acute Care Choice: IP Rehab                               Social Drivers of Health (SDOH) Interventions SDOH Screenings   Food Insecurity: No Food Insecurity (02/02/2024)  Housing: Low Risk  (02/02/2024)  Transportation Needs: No Transportation Needs (02/02/2024)  Utilities: Not At Risk (02/02/2024)  Social Connections: Patient Declined (02/08/2024)  Tobacco Use: Low Risk  (02/02/2024)     Readmission Risk Interventions    01/29/2024   11:53 AM  Readmission Risk Prevention Plan  Post Dischage Appt Complete  Medication Screening Complete  Transportation Screening Complete

## 2024-02-10 NOTE — Progress Notes (Signed)
 Physical Therapy Treatment Patient Details Name: Samuel Warren MRN: [id number] DOB: [birth year] Today's Date: 02/10/2024   History of Present Illness 33 yo male 02/01/24 with Rt sided weakness.  CT head revealed multiple thrombosed cerebral veins including the SSS, straight sinus, inferior sagittal sinus and both transverse sinuses left worse than right.  3/24 S/p mechanical thrombectomy. 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 TIA, HLD, deaf    PT Comments  Pt pleasant, responding to gestures. Pt's brother in room and communicating in [place] via interpreter Nedra Hai [phone] and some English but only gestures to pt not true ASL and when therapist uses basic ASL pt did not comprehend. Pt reports shaking head no to being able to read [place] or vietnamese and resorted to gestural communication with pt this session. Pt with appropriate use of RW during gait, completed long hall gait and stairs without need for physical assist. Brother confirmed family assist at home with plan appropriately updated to OPPT.     If plan is discharge home, recommend the following: Assist for transportation;Help with stairs or ramp for entrance;Assistance with cooking/housework;A little help with walking and/or transfers;A little help with bathing/dressing/bathroom   Can travel by private vehicle        Equipment Recommendations  Rolling walker (2 wheels);BSC/3in1    Recommendations for Other Services       Precautions / Restrictions Precautions Precautions: Fall Recall of Precautions/Restrictions: Intact Precaution/Restrictions Comments: unable to fully assess due to communication barriers     Mobility  Bed Mobility Overal bed mobility: Modified Independent             General bed mobility comments: pt able to rise from supine and return to supine without assist    Transfers Overall transfer level: Modified independent                 General  transfer comment: pt standing from bed without support or assist    Ambulation/Gait Ambulation/Gait assistance: Supervision Gait Distance (Feet): 350 Feet Assistive device: Rolling walker (2 wheels) Gait Pattern/deviations: Step-through pattern, Decreased stride length   Gait velocity interpretation: >2.62 ft/sec, indicative of community ambulatory   General Gait Details: pt with good use of RW with appropriate proximity and steady gait. visual/gestural cues for direction   Stairs Stairs: Yes Stairs assistance: Supervision Stair Management: Alternating pattern, Forwards, Two rails Number of Stairs: 11 General stair comments: pt performed 11 stairs with reliance on bil rails. Pt appears to have decreased proprioception of right foot as visually assessing foot placement for clearance of step   Wheelchair Mobility     Tilt Bed    Modified Rankin (Stroke Patients Only) Modified Rankin (Stroke Patients Only) Pre-Morbid Rankin Score: Moderately severe disability Modified Rankin: Moderately severe disability     Balance Overall balance assessment: Needs assistance Sitting-balance support: No upper extremity supported, Feet supported Sitting balance-Leahy Scale: Good     Standing balance support: Bilateral upper extremity supported Standing balance-Leahy Scale: Fair Standing balance comment: RW during gait                            Communication Communication Communication: Impaired Factors Affecting Communication: Hearing impaired;Non - English speaking, interpreter not available  Cognition Arousal: Alert Behavior During Therapy: WFL for tasks assessed/performed   PT - Cognitive impairments: Difficult to assess Difficult to assess due to: Hard of hearing/deaf, Non-English speaking  PT - Cognition Comments: pt is deaf and uses minimal ASL. Attempted vietnamese interpreter to commnicate with brother but brother states he can't  truly communicate with pt. Pt nodded no to reading [place] or vietnamese Following commands: Intact Following commands impaired: Follows one step commands with increased time    Cueing Cueing Techniques: Gestural cues, Visual cues  Exercises      General Comments        Pertinent Vitals/Pain Pain Assessment Pain Assessment: Faces Faces Pain Scale: Hurts a little bit Pain Location: posterior HA    Home Living                          Prior Function            PT Goals (current goals can now be found in the care plan section) Progress towards PT goals: Progressing toward goals    Frequency    Min 2X/week      PT Plan      Co-evaluation              AM-PAC PT "6 Clicks" Mobility   Outcome Measure  Help needed turning from your back to your side while in a flat bed without using bedrails?: None Help needed moving from lying on your back to sitting on the side of a flat bed without using bedrails?: None Help needed moving to and from a bed to a chair (including a wheelchair)?: A Little Help needed standing up from a chair using your arms (e.g., wheelchair or bedside chair)?: A Little Help needed to walk in hospital room?: A Little Help needed climbing 3-5 steps with a railing? : A Little 6 Click Score: 20    End of Session   Activity Tolerance: Patient tolerated treatment well Patient left: in bed;with call bell/phone within reach;with family/visitor present Nurse Communication: Mobility status PT Visit Diagnosis: Difficulty in walking, not elsewhere classified (R26.2);Other abnormalities of gait and mobility (R26.89)     Time: 1050-1109 PT Time Calculation (min) (ACUTE ONLY): 19 min  Charges:    $Gait Training: 8-22 mins PT General Charges $$ ACUTE PT VISIT: 1 Visit                     Merryl Hacker, PT Acute Rehabilitation Services Office: [phone]    Herb Beltre B Ifeoluwa Bartz 02/10/2024, 11:30 AM

## 2024-02-10 NOTE — Progress Notes (Signed)
 Patient's family was very determined to take patient home now instead of waiting til this evening as Dr. Jacqulyn Bath had suggested after 10mg  of eliquis was to be given.   Dr. Jacqulyn Bath said to give 10mg  of eliquis now, tell patient to begin script at home tomorrow morning, and discharge patient now.  Patient given eliquis and escorted to family car by RN

## 2024-02-10 NOTE — Progress Notes (Signed)
 Nutrition Follow-up  DOCUMENTATION CODES:  Not applicable  INTERVENTION:  Continue regular diet Ensure Enlive po BID, each supplement provides 350 kcal and 20 grams of protein. MVI with minerals daily Discussed adequate intake after discharge to help pt meet calorie and protein needs  NUTRITION DIAGNOSIS:  Inadequate oral intake related to inability to eat as evidenced by NPO status. - remains applicable  GOAL:  Patient will meet greater than or equal to 90% of their needs - progressing, diet advanced  MONITOR:  PO intake, Labs, Weight trends  REASON FOR ASSESSMENT:  Ventilator   ASSESSMENT:  Pt with hx of recent admission for meningitis admitted for acute cerebral venous thrombosis. Pt hearing impaired and requires [place] sign language interpreter.  3/23 admitted to ICU;  3/24 intubated and sedated; OR for vascularization of L internal jugular, L sigmoid sinus and transverse sinus; continued occlusion of sagittal sinus  3/25 - extubated  Pt sleeping at the time of assessment. Family member at bedside awake and indicates that pt is eating well. Per chart review, pt eating average 82% of 8 recorded meals. Pt's family member reports pt likes Ensures and has continued to drink them. Discussed importance of adequate nutrition following discharge to help pt meet calorie and protein needs, family member understands. Pt expected to be discharged today 4/1.  Admit weight: 68.9 kg  Current weight: 68 kg   Average Meal Intake: 3/26: 100% intake x 2 recorded meals 3/29-3/31: 82% intake x 8 recorded meals  Nutritionally Relevant Medications: Scheduled Meds: Lipitor Ensure Enlive BID MVI w/ minerals  PRN Meds: bisacodyl, docusate sodium, polyethylene glycol  Labs Reviewed  Diet Order:   Diet Order             Diet regular Room service appropriate? Yes with Assist; Fluid consistency: Thin  Diet effective now                  EDUCATION NEEDS:  Not appropriate for  education at this time  Skin:  Skin Assessment: Reviewed RN Assessment  Last BM:  3/31 type 4  Height:  Ht Readings from Last 1 Encounters:  02/01/24 5\' 4"  (1.626 m)   Weight:  Wt Readings from Last 1 Encounters:  02/09/24 68 kg   Ideal Body Weight:  59 kg  BMI:  Body mass index is 25.73 kg/m.  Estimated Nutritional Needs:  Kcal:  1900-2100 Protein:  90-110g Fluid:  >2L  Louis Meckel Dietetic Intern

## 2024-02-10 NOTE — Progress Notes (Signed)
 Inpatient Rehab Admissions Coordinator:    Note Pt. Progressing well, OT now recommends HH. Pt. And family on board to d/c home. CIR will sign off.   Megan Salon, MS, CCC-SLP Rehab Admissions Coordinator  [phone] (celll) [phone] (office)

## 2024-02-10 NOTE — Discharge Summary (Signed)
 Physician Discharge Summary  Samuel Warren WUJ:[id number] DOB: 1991/09/27 DOA: 02/01/2024  PCP: Pcp, No  Admit date: 02/01/2024 Discharge date: 02/10/2024 30 Day Unplanned Readmission Risk Score    Flowsheet Row ED to Hosp-Admission (Current) from 02/01/2024 in [facility]  30 Day Unplanned Readmission Risk Score (%) 10.98 Filed at 02/10/2024 0802       This score is the patient's risk of an unplanned readmission within 30 days of being discharged (0 -100%). The score is based on dignosis, age, lab data, medications, orders, and past utilization.   Low:  0-14.9   Medium: 15-21.9   High: 22-29.9   Extreme: 30 and above          Admitted From: Home Disposition: Home  Recommendations for Outpatient Follow-up:  Follow up with PCP in 1-2 weeks Please obtain BMP/CBC in one week Follow-up with neurology in 4 weeks Please follow up with your PCP on the following pending results: Unresulted Labs (From admission, onward)     Start     Ordered   02/08/24 0500  CBC  Daily,   R     Question:  Specimen collection method  Answer:  Lab=Lab collect   02/07/24 1029              Home Health: None Equipment/Devices: Commode  Discharge Condition: Stable CODE STATUS: Full code Diet recommendation: Cardiac  Subjective: Seen and examined, occipital headache is improving.  Brother at the bedside.  Used [place] language interpreter and discussed plan of discharge home today and they were in agreement with no questions.  Brief/Interim Summary: Samuel Warren is a 33 y.o. 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   Reportedly, patient was discharged 2-3 days ago on doxy after being found to have meningitis. His mother called him and stated he was not doing well and needed to go to hospital.  She described he was unable to move or respond (per the brother).  He also reportedly complained of HA, N/V. Pt's family ultimately called EMS and code stroke initiated.  He was afebrile in the ED.  CTH in ed revealed multiple venous thrombi without acute hypodensity.  Admitted in ICU under critical care, neuroconsulted.   Significant Hospital Events: Including procedures, antibiotic start and stop dates in addition to other pertinent events   Readmitted to ICU 3/23 3/24 Left common carotid carotid arteriogram and transverse sinus sigmoid sinus with occluded posterior half of superior sagittal sinus and occluded dominant left transverse sinus, left sigmoid sinus and proximal internal jugular vein. 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   Assessment & Plan:   Principal Problem:   Acute cerebral venous sinus thrombosis Active Problems:   Dural venous sinus thrombosis   Acute cerebral venous thrombosis - likely 2/2 meningoencephalitis. Code Stroke CT head multiple thrombosed cerebral veins including the SSS, straight sinus, inferior sagittal sinus and both transverse sinuses left worse than right. No acute hypodensity is seen  CTA head & neck  Diffuse dural sinus thrombosis  CTV Extensive dural sinus thrombus within the posterior aspect of the superior sagittal sinus, the straight sinus, the inferior sagittal sinus and left greater than right transverse sinus. Thrombus extends into the left sigmoid sinus and visualized left internal jugular vein. Innumerable enlarged and tortuous cortical veins are visualized, consistent with  collateral venous drainage. Enlarged left petrosal veins are present in the middle cranial fossa Repeat CT head Diffuse hyperdensity throughout the major dural sinuses.  ssociated cerebral edema with diffuse loss of cortical sulcation and 7 mm of cerebellar tonsillar ectopia, stable. 2D Echo 3/18 EF 45 to 50%. Apical  hypokinesis with overall mild LV dysfunction  LDL 279 HgbA1c 5.7  No antithrombotic prior to admission, now on Eliquis. 3/24 S/p revascularization of left internal jugular, left sigmoid sinus and left transverse sinus. Superior sagittal sinus remained occluded.  Neuro and neuro IR followed.  Seen by PT OT who recommends CIR however while he was waiting for CIR admission, he continued to work with PT OT, in the interim, he lost his insurance and he improved and PT recommended discharging home with outpatient neurorehab..  Patient is now cleared by neurology for discharge to continue Eliquis for 6 to 9 months.  He was started on Eliquis 10 mg p.o. twice daily for 7 days and tonight will be his last dose that he will get prior to discharge and then starting tomorrow, he will be on Eliquis 5 mg p.o. twice daily.   Recent viral meningitis: Completed course of doxycycline.   Hyperlipidemia: On atorvastatin 80 mg PTA.  LDL to 79.  Goal<70.  Continue atorvastatin.   Deaf and communicates via [place] sign language.   Discharge plan was discussed with patient and/or family member and they verbalized understanding and agreed with it.  Discharge Diagnoses:  Principal Problem:   Acute cerebral venous sinus thrombosis Active Problems:   Dural venous sinus thrombosis   Hyperlipidemia    Discharge Instructions  Discharge Instructions     Ambulatory Referral to Neuro Rehab   Complete by: As directed    Ambulatory referral to Physical Therapy   Complete by: As directed       Allergies as of 02/10/2024   No Known Allergies      Medication List     STOP taking these medications    aspirin EC 81 MG tablet   doxycycline 100 MG tablet Commonly known as: VIBRA-TABS       TAKE these medications    atorvastatin 80 MG tablet Commonly known as: LIPITOR Take 1 tablet (80 mg total) by mouth daily.   Eliquis 5 MG Tabs tablet Generic drug: apixaban Take 1 tablet (5 mg total) by mouth 2 (two)  times daily. Start taking on: February 11, 2024   ondansetron 4 MG tablet Commonly known as: ZOFRAN Take 1 tablet (4 mg total) by mouth every 6 (six) hours as needed for nausea.        Follow-up Information     PCP Follow up in 1 week(s).          [facility] Follow up in 1 month(s).   Contact information: [address]     [address] [place] [address] [phone]        [facility] .   Contact information: [address] [place] [address] [phone]                No Known Allergies  Consultations: Neuro and IR   Procedures/Studies: [facility] MOD SED Result Date: 02/04/2024 INDICATION: Progressive headaches. Left-sided weakness. Diffuse dural venous sinus thrombosis on CT angiogram, CT venogram, and MRI of the brain. EXAM: 1. EMERGENT LARGE VESSEL OCCLUSION THROMBOLYSIS (POSTERIOR CIRCULATION) COMPARISON:  CT venogram, and MRI of the brain of 02/02/2024. MEDICATIONS: Ancef 2  g IV. Antibiotic was administered within 1 hour of the procedure. ANESTHESIA/SEDATION: General anesthesia. CONTRAST:  Omnipaque 300 approximately 130 mL. FLUOROSCOPY TIME:  Fluoroscopy Time: 139 minutes 42 seconds (3810 mGy). COMPLICATIONS: None immediate. TECHNIQUE: Following a full explanation of the procedure along with the potential associated complications, an informed witnessed consent was obtained. The risks of intracranial hemorrhage of 10%, worsening neurological deficit, ventilator dependency, death and inability to revascularize were all reviewed in detail with the patient's brother. The patient was then put under general anesthesia by the Department of Anesthesiology at [facility]. Both groins were prepped and draped in the usual sterile fashion. Thereafter using modified Seldinger technique, transfemoral access into the left common femoral artery was obtained without difficulty. Over an  0.035 inch guidewire a 5 French Pinnacle sheath was inserted. Through this, and also over an 0.035 inch guidewire a 5 [place] JB 1 catheter was advanced to the aortic arch region and selectively positioned in the left common carotid artery. Similar access into the right common femoral vein was obtained over an 035 inch wire with initially a 5 [place] Pinnacle sheath. Over an 035 inch guidewire, 5 [place] JB 1 catheter was advanced through the inferior vena cava, the right atrium, and into the left brachiocephalic vein. Access into the proximal left internal jugular vein was then obtained using a Roadrunner guidewire followed by advancement of the JB 1 catheter. The guidewire was removed. Good aspiration obtained from the hub of the JB 1 catheter. A venogram was then performed. FINDINGS: Normal opacification is seen of the right common carotid artery bifurcation, the right external carotid artery and its branches, and the right internal carotid artery to the cranial skull base. The petrous, the cavernous and the supraclinoid ICA demonstrated patency. The right middle cerebral artery and the right anterior cerebral artery opacify into the capillary and venous phases. The distal M3 M4 branches of the right middle cerebral artery demonstrated significant tortuosity in the region of the posterior frontal convexity with delayed opacification of the superior sagittal sinus in its anterior 2/3. The delayed arterial phase also demonstrates prominent subcutaneous, emissary, and diploic veins. The AP projection also demonstrates opacification of the cortical veins on the contralateral left parasagittal frontal area with tortuosity draining via the perisylvian veins in the internal middle cerebral vein and subsequently the cavernous sinus and the inferior petrosal sinus. The left common carotid arteriogram reveals the left external carotid artery, and the left internal carotid artery to the cranial skull base to be widely patent.  The petrous, the cavernous and the supraclinoid left ICA are widely patent. The left middle cerebral artery and the left anterior cerebral artery opacify into the capillary and venous phases. Demonstrated is significant tortuosity of the middle cerebral artery M3 M4 branches opacifying the superior sagittal sinus with prominent tubular structures proximal to this probably representing engorged cortical veins. Additionally demonstrated are multiple emissary veins and diploic veins draining externally. The delayed images demonstrate no opacification of the post posterior 1/3 of the superior sagittal sinus or the transverse or sigmoid sinuses bilaterally. PROCEDURE: The 5 French diagnostic catheter in the proximal left internal jugular vein was then exchanged over an 0.035 inch 300 cm Rosen exchange guidewire for a 110 cm 088 Zoom aspiration catheter. The guidewire was removed. Slow aspiration of blood was obtained at the hub of the 088 Zoom aspiration catheter. Partial aspiration was then performed via a pump for a minute through this. Through the 088 aspiration catheter, an 071 137 cm  Zoom aspiration catheter was advanced in combination with a 160 cm 027 Phenom microcatheter over an 018 inch Aristotle micro guidewire with a moderate J configuration. Thereafter, with slow advancement of the microcatheter over the micro guidewire followed by a gentle control arteriogram through the microcatheter in order to ensure safe positioning of the tip of the microcatheter, advancement was made into the region of the left sigmoid sinus transverse sinus junction, and subsequently torcular region. During these times, intermittently, aspirations were performed through the 071 Zoom aspiration catheter in the sigmoid sinus, the left transverse sinus sigmoid sinus junction and the left transverse sinus with attainment of small dark blood clots. The 071 aspiration catheter was then advanced to the torcular region. Multiple attempts were  then made with the micro guidewire and the microcatheter in order to establish recanalization with the micro guidewire followed by the microcatheter into the posterior aspect of the superior sagittal sinus without success. Access into the contralateral right transverse sinus was established multiple times with aspiration of just fresh blood. Following repeated attempts at attempting access into superior cerebral sinus with different micro catheters and micro guidewire and aspiration catheters, the procedure was stopped. A final venogram performed through the 088 Zoom aspiration catheter in the distal sigmoid sinus demonstrated the recanalization of the sigmoid sinus, the left transverse sinus sigmoid sinus junction, and also the left transverse sinus with antegrade flow noted into the internal jugular vein. This was then removed. A final control arteriogram performed through the 5 French diagnostic catheter in the left common carotid artery demonstrated patency of the left internal carotid artery, extra cranially and intracranially, with no change in the left middle cerebral artery and the left anterior cerebral artery distributions. Again demonstrated was faint opacification of the 1/3 of the superior sagittal sinus which remained static probably due to contrast in the thrombus. The 5 French sheath in the left common femoral artery, and the 8 French sheath in the right common femoral vein were then sutured to the skin for stability. A flat panel CT of the brain revealed no evidence of hemorrhagic complications. Again noted was the hyperdensity in the thrombus and the superior sagittal sinus and the inferior sagittal sinus. Patient was left intubated in possible anticipation of retry the following day. Patient was then transferred in stable condition to the floor. IMPRESSION: Status post recanalization of the left transverse sinus, the left sigmoid sinus, and the proximal left internal jugular vein with contact  aspiration, and use of a 6 mm x 47 mm Embotrap retrieval device. PLAN: As per referring MD. Electronically Signed   By: Julieanne Cotton M.D.   On: 02/04/2024 08:22   IR Veno/Jugular Left Result Date: 02/04/2024 INDICATION: Progressive headaches. Left-sided weakness. Diffuse dural venous sinus thrombosis on CT angiogram, CT venogram, and MRI of the brain. EXAM: 1. EMERGENT LARGE VESSEL OCCLUSION THROMBOLYSIS (POSTERIOR CIRCULATION) COMPARISON:  CT venogram, and MRI of the brain of 02/02/2024. MEDICATIONS: Ancef 2 g IV. Antibiotic was administered within 1 hour of the procedure. ANESTHESIA/SEDATION: General anesthesia. CONTRAST:  Omnipaque 300 approximately 130 mL. FLUOROSCOPY TIME:  Fluoroscopy Time: 139 minutes 42 seconds (3810 mGy). COMPLICATIONS: None immediate. TECHNIQUE: Following a full explanation of the procedure along with the potential associated complications, an informed witnessed consent was obtained. The risks of intracranial hemorrhage of 10%, worsening neurological deficit, ventilator dependency, death and inability to revascularize were all reviewed in detail with the patient's brother. The patient was then put under general anesthesia by the Department of Anesthesiology at  [facility]. Both groins were prepped and draped in the usual sterile fashion. Thereafter using modified Seldinger technique, transfemoral access into the left common femoral artery was obtained without difficulty. Over an 0.035 inch guidewire a 5 French Pinnacle sheath was inserted. Through this, and also over an 0.035 inch guidewire a 5 [place] JB 1 catheter was advanced to the aortic arch region and selectively positioned in the left common carotid artery. Similar access into the right common femoral vein was obtained over an 035 inch wire with initially a 5 [place] Pinnacle sheath. Over an 035 inch guidewire, 5 [place] JB 1 catheter was advanced through the inferior vena cava, the right atrium, and into the left  brachiocephalic vein. Access into the proximal left internal jugular vein was then obtained using a Roadrunner guidewire followed by advancement of the JB 1 catheter. The guidewire was removed. Good aspiration obtained from the hub of the JB 1 catheter. A venogram was then performed. FINDINGS: Normal opacification is seen of the right common carotid artery bifurcation, the right external carotid artery and its branches, and the right internal carotid artery to the cranial skull base. The petrous, the cavernous and the supraclinoid ICA demonstrated patency. The right middle cerebral artery and the right anterior cerebral artery opacify into the capillary and venous phases. The distal M3 M4 branches of the right middle cerebral artery demonstrated significant tortuosity in the region of the posterior frontal convexity with delayed opacification of the superior sagittal sinus in its anterior 2/3. The delayed arterial phase also demonstrates prominent subcutaneous, emissary, and diploic veins. The AP projection also demonstrates opacification of the cortical veins on the contralateral left parasagittal frontal area with tortuosity draining via the perisylvian veins in the internal middle cerebral vein and subsequently the cavernous sinus and the inferior petrosal sinus. The left common carotid arteriogram reveals the left external carotid artery, and the left internal carotid artery to the cranial skull base to be widely patent. The petrous, the cavernous and the supraclinoid left ICA are widely patent. The left middle cerebral artery and the left anterior cerebral artery opacify into the capillary and venous phases. Demonstrated is significant tortuosity of the middle cerebral artery M3 M4 branches opacifying the superior sagittal sinus with prominent tubular structures proximal to this probably representing engorged cortical veins. Additionally demonstrated are multiple emissary veins and diploic veins draining  externally. The delayed images demonstrate no opacification of the post posterior 1/3 of the superior sagittal sinus or the transverse or sigmoid sinuses bilaterally. PROCEDURE: The 5 French diagnostic catheter in the proximal left internal jugular vein was then exchanged over an 0.035 inch 300 cm Rosen exchange guidewire for a 110 cm 088 Zoom aspiration catheter. The guidewire was removed. Slow aspiration of blood was obtained at the hub of the 088 Zoom aspiration catheter. Partial aspiration was then performed via a pump for a minute through this. Through the 088 aspiration catheter, an 071 137 cm Zoom aspiration catheter was advanced in combination with a 160 cm 027 Phenom microcatheter over an 018 inch Aristotle micro guidewire with a moderate J configuration. Thereafter, with slow advancement of the microcatheter over the micro guidewire followed by a gentle control arteriogram through the microcatheter in order to ensure safe positioning of the tip of the microcatheter, advancement was made into the region of the left sigmoid sinus transverse sinus junction, and subsequently torcular region. During these times, intermittently, aspirations were performed through the 071 Zoom aspiration catheter in the sigmoid sinus, the left  transverse sinus sigmoid sinus junction and the left transverse sinus with attainment of small dark blood clots. The 071 aspiration catheter was then advanced to the torcular region. Multiple attempts were then made with the micro guidewire and the microcatheter in order to establish recanalization with the micro guidewire followed by the microcatheter into the posterior aspect of the superior sagittal sinus without success. Access into the contralateral right transverse sinus was established multiple times with aspiration of just fresh blood. Following repeated attempts at attempting access into superior cerebral sinus with different micro catheters and micro guidewire and aspiration  catheters, the procedure was stopped. A final venogram performed through the 088 Zoom aspiration catheter in the distal sigmoid sinus demonstrated the recanalization of the sigmoid sinus, the left transverse sinus sigmoid sinus junction, and also the left transverse sinus with antegrade flow noted into the internal jugular vein. This was then removed. A final control arteriogram performed through the 5 French diagnostic catheter in the left common carotid artery demonstrated patency of the left internal carotid artery, extra cranially and intracranially, with no change in the left middle cerebral artery and the left anterior cerebral artery distributions. Again demonstrated was faint opacification of the 1/3 of the superior sagittal sinus which remained static probably due to contrast in the thrombus. The 5 French sheath in the left common femoral artery, and the 8 French sheath in the right common femoral vein were then sutured to the skin for stability. A flat panel CT of the brain revealed no evidence of hemorrhagic complications. Again noted was the hyperdensity in the thrombus and the superior sagittal sinus and the inferior sagittal sinus. Patient was left intubated in possible anticipation of retry the following day. Patient was then transferred in stable condition to the floor. IMPRESSION: Status post recanalization of the left transverse sinus, the left sigmoid sinus, and the proximal left internal jugular vein with contact aspiration, and use of a 6 mm x 47 mm Embotrap retrieval device. PLAN: As per referring MD. Electronically Signed   By: Julieanne Cotton M.D.   On: 02/04/2024 08:22   CT HEAD WO CONTRAST ( ) Result Date: 02/03/2024 CLINICAL DATA:  Clot related to COVID EXAM: CT HEAD WITHOUT CONTRAST TECHNIQUE: Contiguous axial images were obtained from the base of the skull through the vertex without intravenous contrast. RADIATION DOSE REDUCTION: This exam was performed according to the  departmental dose-optimization program which includes automated exposure control, adjustment of the mA and/or kV according to patient size and/or use of iterative reconstruction technique. COMPARISON:  CT head February 02, 2024. FINDINGS: Brain: No evidence of acute hemorrhage, mass lesion, midline shift or hydrocephalus. Known small infarcts better seen on recent MRI. Similar cerebral edema and low lying tonsils. Vascular: Redemonstrated diffusely hyperdense dural venous sinuses, compatible with known thrombus. Skull: No acute fracture. Sinuses/Orbits: Mild paranasal sinus mucosal thickening. No acute orbital findings. IMPRESSION: Redemonstrated dural venous sinus thrombosis, better characterized on prior studies. Similar cerebral edema and low lying tonsils. No evidence of new/interval abnormality by CT. Electronically Signed   By: Feliberto Harts M.D.   On: 02/03/2024 02:59   CT HEAD POST STROKE FOLLOWUP/TIMED/STAT READ Result Date: 02/02/2024 CLINICAL DATA:  Initial evaluation for acute neuro deficit, stroke suspected. EXAM: CT HEAD WITHOUT CONTRAST TECHNIQUE: Contiguous axial images were obtained from the base of the skull through the vertex without intravenous contrast. RADIATION DOSE REDUCTION: This exam was performed according to the departmental dose-optimization program which includes automated exposure control, adjustment of the mA and/or  kV according to patient size and/or use of iterative reconstruction technique. COMPARISON:  Comparison made with prior studies from 02/01/2024 FINDINGS: Brain: Diffuse hyperdensity throughout the major dural sinuses, compatible with previously identified dural venous sinus thrombosis. No acute intracranial hemorrhage. Previously identified scattered small volume venous infarctions not well visualized by CT. Diffuse loss of cortical sulcation, compatible with associated cerebral edema. Approximate 8 mm of cerebellar tonsillar ectopia, relatively stable. Similar basilar  cistern crowding. No acute intracranial hemorrhage. No other acute cortically based infarct. No mass lesion or midline shift. No extra-axial fluid collection. Vascular: Diffuse hyperdensity throughout the major dural sinuses, compatible with venous sinus thrombosis. No abnormal hyperdense arterial vessel. Skull: Scalp soft tissues and calvarium demonstrate no new finding. Sinuses/Orbits: Globes orbital soft tissues within normal limits. Paranasal sinuses remain largely clear. No significant mastoid effusion. Other: None. IMPRESSION: 1. Diffuse hyperdensity throughout the major dural sinuses, compatible with previously identified dural venous sinus thrombosis. 2. Associated cerebral edema with diffuse loss of cortical sulcation and 7 mm of cerebellar tonsillar ectopia, stable. 3. Previously identified scattered small volume venous infarctions not well visualized by CT. 4. No other new acute intracranial abnormality. No acute intracranial hemorrhage. Electronically Signed   By: Rise Mu M.D.   On: 02/02/2024 01:22   MR BRAIN WO CONTRAST Result Date: 02/01/2024 CLINICAL DATA:  Initial evaluation for acute neuro deficit, stroke suspected. EXAM: MRI HEAD WITHOUT CONTRAST TECHNIQUE: Multiplanar, multiecho pulse sequences of the brain and surrounding structures were obtained without intravenous contrast. COMPARISON:  Comparison made with prior MRI from 01/26/2024 as well as CTs from earlier the same day. 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 There is associated diffuse venous engorgement with prominence of the leptomeningeal vessels throughout the brain. Few scattered foci of restricted diffusion seen involving the parasagittal frontal lobes and deep white matter of the right cerebral hemisphere (series 10, images 69, 64, 63), as well as the subcortical left occipital lobe (series 10, image 55).  Findings consistent with small associated venous infarctions, a few which are new as compared to previous exam. No visible associated hemorrhage or significant mass effect. Associated mild diffuse cerebral edema with sulcal effacement noted, similar to prior. Basilar cisterns remain patent. No other acute arterial infarct elsewhere within the brain. Underlying cerebral volume within normal limits. No visible partially empty sella noted. Vascular: Findings consistent with widespread dural venous sinus thrombosis. Major intracranial arterial flow voids are maintained. Skull and upper cervical spine: Cerebellar tonsils are low lying extending up to 8 mm below the foramen magnum, similar to prior. Bone marrow signal intensity within normal limits. No scalp soft tissue abnormality. Sinuses/Orbits: Globes and orbital soft tissues within normal limits. Mild mucosal thickening noted about the ethmoidal air cells and maxillary sinuses. Trace bilateral mastoid effusions noted. Image nasopharynx unremarkable. Other: None. IMPRESSION: 1. 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 2. Few scattered foci of restricted diffusion involving the parasagittal frontal lobes and deep white matter of the right cerebral hemisphere, as well as the subcortical left occipital lobe, consistent with small associated venous infarctions. A few of these are new as compared to previous MRI from 01/26/2024. No associated hemorrhage or significant mass effect. 3. Low lying cerebellar tonsils extending up to 8 mm below the foramen magnum, suggesting elevated intracranial pressure, similar to prior. Electronically Signed   By: Rise Mu M.D.   On:  02/01/2024 21:06   CT VENOGRAM HEAD Result Date: 02/01/2024 CLINICAL DATA:  Right-sided weakness.  Abnormal CT head. EXAM: CT VENOGRAM HEAD TECHNIQUE: Venographic phase images of the brain were obtained following the  administration of intravenous contrast. Multiplanar reformats and maximum intensity projections were generated. RADIATION DOSE REDUCTION: This exam was performed according to the departmental dose-optimization program which includes automated exposure control, adjustment of the mA and/or kV according to patient size and/or use of iterative reconstruction technique. CONTRAST:  OMNIPAQUE IOHEXOL 350 MG/ML SOLN COMPARISON:  CT angio head and neck 01/04/2024 and CT head without contrast 01/04/2024. FINDINGS: Extensive dural sinus filling defects are compatible with thrombus within the posterior aspect of the superior sagittal sinus, the straight sinus the inferior sagittal sinus and left greater than right transverse sinus. Thrombus extends into the sigmoid sinus and visualized left internal jugular vein. Innumerable enlarged and tortuous cortical veins are visualized. Several veins drain into the cavernous sinus bilaterally. Asymmetric veins are present in the left middle cranial fossa draining into the left petrosal sinuses. IMPRESSION: 1. 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 Thrombus extends into the left sigmoid sinus and visualized left internal jugular vein. 2. Innumerable enlarged and tortuous cortical veins are visualized, consistent with collateral venous drainage. Enlarged left petrosal veins are present in the middle cranial fossa. These results were called by telephone at the time of interpretation on 02/01/2024 at 6:32 Pm to provider ERIC [facility] , who verbally acknowledged these results. Electronically Signed   By: Marin Roberts M.D.   On: 02/01/2024 19:20   CT ANGIO HEAD NECK W WO CM W PERF (CODE STROKE) Result Date: 02/01/2024 CLINICAL DATA: Right-sided weakness. Abnormal CT. Diffuse dural sinus thrombosis suspected. EXAM: CT ANGIOGRAPHY HEAD  AND NECK CT PERFUSION BRAIN TECHNIQUE: Multidetector CT imaging of the head and neck was performed using the standard protocol during bolus administration of intravenous contrast. Multiplanar CT image reconstructions and MIPs were obtained to evaluate the vascular anatomy. Carotid stenosis measurements (when applicable) are obtained utilizing NASCET criteria, using the distal internal carotid diameter as the denominator. Multiphase CT imaging of the brain was performed following IV bolus contrast injection. Subsequent parametric perfusion maps were calculated using RAPID software. RADIATION DOSE REDUCTION: This exam was performed according to the departmental dose-optimization program which includes automated exposure control, adjustment of the mA and/or kV according to patient size and/or use of iterative reconstruction technique. CONTRAST:  OMNIPAQUE IOHEXOL 350 MG/ML SOLN COMPARISON:  CT head without contrast 02/01/2024. MR head without and with contrast 01/26/2024. FINDINGS: CTA NECK FINDINGS Aortic arch: A 3 vessel arch configuration is present. No significant atherosclerotic change or focal stenosis is present. The great vessel origins are widely patent. Right carotid system: The right common carotid artery is within normal limits. Bifurcation is unremarkable. Cervical right ICA is normal. Left carotid system: The left common carotid artery is within normal limits. The bifurcation is unremarkable. The cervical left ICA is normal. Vertebral arteries: The left vertebral artery is dominant. Both vertebral arteries originate from the subclavian arteries without significant stenosis. No significant stenosis is present in either vertebral artery in the neck. Skeleton: Vertebral body heights and alignment are normal. Straightening of the normal cervical lordosis is present. No focal osseous lesions are present. Other neck: The soft tissues of the neck are otherwise unremarkable. Salivary glands are within  normal limits. Thyroid is normal.  No significant adenopathy is present. No focal mucosal or submucosal lesions are present. Upper chest: The lung apices are clear. The thoracic inlet is within normal limits. Review of the MIP images confirms the above findings CTA HEAD FINDINGS Anterior circulation: The internal carotid arteries are within normal limits from the skull base to the ICA termini. The A1 and M1 segments are normal. The anterior communicating artery is patent. The ACA and MCA branch vessels are normal bilaterally. No aneurysm is present. Posterior circulation: The left vertebral artery is the dominant vessel. PICA origins are visualized and normal. Vertebrobasilar junction is normal. The superior cerebellar arteries are patent. Both posterior cerebral arteries originate from basilar tip. The PCA branch vessels are normal bilaterally. Venous sinuses: Numerous cortical veins are opacified over the convexities bilaterally. No significant filling of the posterosuperior sagittal sinus, straight sinus or the transverse sinuses are present. Anatomic variants: None Review of the MIP images confirms the above findings CT Brain Perfusion Findings: ASPECTS: 10/10 CBF (<30%) Volume: 0mL Perfusion (Tmax>6.0s) volume: . This is likely accurate measurement given the extensive dural sinus thrombosis. Mismatch Volume: IMPRESSION: 1. Diffuse dural sinus thrombosis. 2. Numerous cortical veins are opacified over the convexities bilaterally represent collateral venous flow to the patent anterior superior sagittal sinus. 3. Diffuse decreased perfusion over the convexities bilaterally. Electronically Signed   By: Marin Roberts M.D.   On: 02/01/2024 19:06   CT HEAD CODE STROKE WO CONTRAST Result Date: 02/01/2024 CLINICAL DATA:  Code stroke.  Right-sided weakness. EXAM: CT HEAD WITHOUT CONTRAST TECHNIQUE: Contiguous axial images were obtained from the base of the skull through the vertex without intravenous  contrast. RADIATION DOSE REDUCTION: This exam was performed according to the departmental dose-optimization program which includes automated exposure control, adjustment of the mA and/or kV according to patient size and/or use of iterative reconstruction technique. COMPARISON:  MR head without and with contrast 01/26/2024. CT angio head and neck 01/25/2024. FINDINGS: Brain: No acute infarct or parenchymal hemorrhage is present. The ventricles are of normal size. Deep brain nuclei are within normal limits. No significant extraaxial fluid collection is present. The brainstem and cerebellum are within normal limits. Vascular: The straight sinus and inferior sagittal sinus are hyperdense. The posterosuperior sagittal sinus and left transverse sinus or hyperdense. Additional cortical veins are visualized on the left. High density extends into the left sigmoid sinus, suggesting diffuse dural sinus thrombosis. Nor teary ule calcifications are present. Skull: Calvarium is intact. No focal lytic or blastic lesions are present. No significant extracranial soft tissue lesion is present. Sinuses/Orbits: The paranasal sinuses and mastoid air cells are clear. The globes and orbits are within normal limits. ASPECTS [facility] Stroke Program Early CT Score) - Ganglionic level infarction (caudate, lentiform nuclei, internal capsule, insula, M1-M3 cortex): 7/7 - Supraganglionic infarction (M4-M6 cortex): 3/3 Total score (0-10 with 10 being normal): 10/10 IMPRESSION: 1. Hyperdense straight sinus, inferior sagittal sinus, posterosuperior sagittal sinus, left transverse sinus, and left sigmoid sinus, suggesting diffuse dural sinus thrombosis. 2. No acute infarct or parenchymal hemorrhage. 3. Aspects is 10/10. Critical Value/emergent results were called by telephone at the time of interpretation on 02/01/2024 at 6:32 pm to provider JOSHUA LONG , who verbally acknowledged these results. Electronically Signed   By: Marin Roberts M.D.    On: 02/01/2024 18:38   ECHOCARDIOGRAM COMPLETE Result Date: 01/27/2024    ECHOCARDIOGRAM REPORT   Patient Name:   [facility] Date of Exam: 01/27/2024 Medical Rec #:  [id number]    Height:  64.0 in Accession #:    [id number]   Weight:       159.2 lb Date of Birth:  1991-04-03    BSA:          1.775 m Patient Age:    32 years     BP:           94/74 mmHg Patient Gender: M            HR:           71 bpm. Exam Location:  Inpatient Procedure: 2D Echo, Cardiac Doppler and Color Doppler (Both Spectral and Color            Flow Doppler were utilized during procedure). Indications:    Stroke I63.9  History:        Patient has no prior history of Echocardiogram examinations.  Sonographer:    Harriette Bouillon RDCS Referring Phys: [id number] ASHISH ARORA IMPRESSIONS  1. Apical hypokinesis with overall mild LV dysfunction.  2. Left ventricular ejection fraction, by estimation, is 45 to 50%. The left ventricle has mildly decreased function. The left ventricle demonstrates regional wall motion abnormalities (see scoring diagram/findings for description). Left ventricular diastolic parameters were normal.  3. Right ventricular systolic function is normal. The right ventricular size is normal.  4. The mitral valve is normal in structure. Trivial mitral valve regurgitation. No evidence of mitral stenosis.  5. The aortic valve is tricuspid. Aortic valve regurgitation is not visualized. No aortic stenosis is present. Comparison(s): No prior Echocardiogram. FINDINGS  Left Ventricle: Left ventricular ejection fraction, by estimation, is 45 to 50%. The left ventricle has mildly decreased function. The left ventricle demonstrates regional wall motion abnormalities. The left ventricular internal cavity size was normal in size. There is no left ventricular hypertrophy. Left ventricular diastolic parameters were normal. Right Ventricle: The right ventricular size is normal. Right ventricular systolic function is normal. Left Atrium: Left  atrial size was normal in size. Right Atrium: Right atrial size was normal in size. Pericardium: There is no evidence of pericardial effusion. Mitral Valve: The mitral valve is normal in structure. Trivial mitral valve regurgitation. No evidence of mitral valve stenosis. Tricuspid Valve: The tricuspid valve is normal in structure. Tricuspid valve regurgitation is trivial. No evidence of tricuspid stenosis. Aortic Valve: The aortic valve is tricuspid. Aortic valve regurgitation is not visualized. No aortic stenosis is present. Pulmonic Valve: The pulmonic valve was normal in structure. Pulmonic valve regurgitation is not visualized. No evidence of pulmonic stenosis. Aorta: The aortic root is normal in size and structure. Venous: The inferior vena cava was not well visualized. IAS/Shunts: The interatrial septum was not well visualized. Additional Comments: Apical hypokinesis with overall mild LV dysfunction.  LEFT VENTRICLE PLAX 2D LVIDd:         5.10 cm   Diastology LVIDs:         3.00 cm   LV e' medial:    9.68 cm/s LV PW:         0.90 cm   LV E/e' medial:  8.3 LV IVS:        0.90 cm   LV e' lateral:   13.30 cm/s LVOT diam:     2.20 cm   LV E/e' lateral: 6.1 LV SV:         84 LV SV Index:   48 LVOT Area:     3.80 cm  RIGHT VENTRICLE RV S prime:     14.10 cm/s TAPSE (M-mode): 2.7 cm LEFT  ATRIUM             Index        RIGHT ATRIUM           Index LA diam:        2.60 cm 1.46 cm/m   RA Area:     11.20 cm LA Vol (A2C):   44.3 ml 24.95 ml/m  RA Volume:   21.30 ml  12.00 ml/m LA Vol (A4C):   33.8 ml 19.04 ml/m LA Biplane Vol: 38.5 ml 21.68 ml/m  AORTIC VALVE LVOT Vmax:   105.00 cm/s LVOT Vmean:  67.000 cm/s LVOT VTI:    0.222 m  AORTA Ao Root diam: 3.40 cm Ao Asc diam:  3.40 cm MITRAL VALVE MV Area (PHT): 3.83 cm    SHUNTS MV Decel Time: 198 msec    Systemic VTI:  0.22 m MV E velocity: 80.70 cm/s  Systemic Diam: 2.20 cm MV A velocity: 86.50 cm/s MV E/A ratio:  0.93 Olga Millers MD Electronically signed by  Olga Millers MD Signature Date/Time: 01/27/2024/11:58:50 AM    Final    MR ANGIO HEAD WO W CONTRAST Addendum Date: 01/26/2024 ADDENDUM REPORT: 01/26/2024 16:11 ADDENDUM: These results will be called to the ordering clinician or representative by the Radiologist Assistant, and communication documented in the PACS or [employer]. Electronically Signed   By: Sebastian Ache M.D.   On: 01/26/2024 16:11   Result Date: 01/26/2024 CLINICAL DATA:  Headache, neuro deficit. Concern for subarachnoid hemorrhage. Headache and neck pain beginning after an outdoor hunting excursion. CSF analysis showing pleocytosis and elevated glucose and protein. EXAM: MRI HEAD WITHOUT AND WITH CONTRAST MRA HEAD WITHOUT CONTRAST TECHNIQUE: Multiplanar, multi-echo pulse sequences of the brain and surrounding structures were acquired without and with intravenous contrast. Angiographic images of the Circle of Willis were acquired using MRA technique without intravenous contrast. CONTRAST:  7mL GADAVIST GADOBUTROL 1 MMOL/ML IV SOLN COMPARISON:  CTA head and neck 01/25/2024 FINDINGS: MRI HEAD FINDINGS Brain: There is prominent leptomeningeal enhancement diffusely throughout the supratentorial and infratentorial compartments, and there is also mild diffuse pachymeningeal enhancement. Abnormal FLAIR hyperintensity is present within cerebral sulci. There are 2 subcentimeter foci of restricted diffusion within the right frontal white matter at the level of the centrum semiovale. Mild, relatively diffuse cerebral cortical edema is also suspected bilaterally. As noted on CT, the ventricles have a largely slit like appearance. No mass, midline shift, intracranial hemorrhage, or extra-axial fluid collection is identified. There is a partially empty sella. The cerebellar tonsils extend 7 mm below the foramen magnum. Vascular: Diffuse venous engorgement with enlargement of medullary and cortical veins as well as distension of the dural venous sinuses.  Absence of normal flow voids in the sigmoid sinuses likely reflects slow flow rather than thrombosis given normal enhancement. Skull and upper cervical spine: No suspicious marrow lesion. Sinuses/Orbits: Unremarkable orbits. Mild mucosal thickening in the paranasal sinuses. No significant mastoid fluid. Other: None. MRA HEAD FINDINGS Anterior circulation: The internal carotid arteries are widely patent from skull base to carotid termini. ACAs and MCAs are patent without evidence of a proximal branch occlusion or significant proximal stenosis. No aneurysm or AVM is identified. Posterior circulation: The included portions of the intracranial vertebral arteries are patent to the basilar with the left being strongly dominant. Patent PICA and SCA origins are visualized bilaterally. There patent posterior communicating arteries bilaterally. Both PCAs are patent without evidence of a significant proximal stenosis. No aneurysm or AVM is identified. Anatomic variants:  None. IMPRESSION: 1. 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 2. 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 3. 7 mm of cerebellar tonsillar ectopia and diffuse venous engorgement likely related to elevated intracranial pressure. 4. Negative head MRA. Electronically Signed: By: Sebastian Ache M.D. On: 01/26/2024 16:01   MR BRAIN W WO CONTRAST Addendum Date: 01/26/2024 ADDENDUM REPORT: 01/26/2024 16:11 ADDENDUM: These results will be called to the ordering clinician or representative by the Radiologist Assistant, and communication documented in the PACS or [employer]. Electronically Signed   By: Sebastian Ache M.D.   On: 01/26/2024 16:11   Result Date: 01/26/2024 CLINICAL DATA:  Headache, neuro deficit. Concern for subarachnoid hemorrhage. Headache and neck pain beginning after an outdoor hunting excursion. CSF  analysis showing pleocytosis and elevated glucose and protein. EXAM: MRI HEAD WITHOUT AND WITH CONTRAST MRA HEAD WITHOUT CONTRAST TECHNIQUE: Multiplanar, multi-echo pulse sequences of the brain and surrounding structures were acquired without and with intravenous contrast. Angiographic images of the Circle of Willis were acquired using MRA technique without intravenous contrast. CONTRAST:  7mL GADAVIST GADOBUTROL 1 MMOL/ML IV SOLN COMPARISON:  CTA head and neck 01/25/2024 FINDINGS: MRI HEAD FINDINGS Brain: There is prominent leptomeningeal enhancement diffusely throughout the supratentorial and infratentorial compartments, and there is also mild diffuse pachymeningeal enhancement. Abnormal FLAIR hyperintensity is present within cerebral sulci. There are 2 subcentimeter foci of restricted diffusion within the right frontal white matter at the level of the centrum semiovale. Mild, relatively diffuse cerebral cortical edema is also suspected bilaterally. As noted on CT, the ventricles have a largely slit like appearance. No mass, midline shift, intracranial hemorrhage, or extra-axial fluid collection is identified. There is a partially empty sella. The cerebellar tonsils extend 7 mm below the foramen magnum. Vascular: Diffuse venous engorgement with enlargement of medullary and cortical veins as well as distension of the dural venous sinuses. Absence of normal flow voids in the sigmoid sinuses likely reflects slow flow rather than thrombosis given normal enhancement. Skull and upper cervical spine: No suspicious marrow lesion. Sinuses/Orbits: Unremarkable orbits. Mild mucosal thickening in the paranasal sinuses. No significant mastoid fluid. Other: None. MRA HEAD FINDINGS Anterior circulation: The internal carotid arteries are widely patent from skull base to carotid termini. ACAs and MCAs are patent without evidence of a proximal branch occlusion or significant proximal stenosis. No aneurysm or AVM is identified.  Posterior circulation: The included portions of the intracranial vertebral arteries are patent to the basilar with the left being strongly dominant. Patent PICA and SCA origins are visualized bilaterally. There patent posterior communicating arteries bilaterally. Both PCAs are patent without evidence of a significant proximal stenosis. No aneurysm or AVM is identified. Anatomic variants: None. IMPRESSION: 1. 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 2. 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 3. 7 mm of cerebellar tonsillar ectopia and diffuse venous engorgement likely related to elevated intracranial pressure. 4. Negative head MRA. Electronically Signed: By: Sebastian Ache M.D. On: 01/26/2024 16:01   CT ANGIO HEAD NECK W WO CM Result Date: 01/25/2024 CLINICAL DATA:  Neuro deficit, acute, stroke suspected EXAM: CT ANGIOGRAPHY HEAD AND NECK WITH AND WITHOUT CONTRAST TECHNIQUE: Multidetector CT imaging of the head and neck was performed using the standard protocol during bolus administration of intravenous contrast. Multiplanar CT image reconstructions and MIPs were obtained to evaluate the vascular anatomy. Carotid  stenosis measurements (when applicable) are obtained utilizing NASCET criteria, using the distal internal carotid diameter as the denominator. RADIATION DOSE REDUCTION: This exam was performed according to the departmental dose-optimization program which includes automated exposure control, adjustment of the mA and/or kV according to patient size and/or use of iterative reconstruction technique. CONTRAST:  75mL OMNIPAQUE IOHEXOL 350 MG/ML SOLN COMPARISON:  None Available. FINDINGS: CT HEAD FINDINGS Brain: No focal brain insult is seen. There are no visible subarachnoid spaces other than slit like ventricles, raising the possibility of generalized brain swelling or  subacute subarachnoid hemorrhage. Vascular: No abnormal vascular finding. Skull: Negative Sinuses/Orbits: Clear/normal Other: None Review of the MIP images confirms the above findings CTA NECK FINDINGS Aortic arch: Normal Right carotid system: Common carotid artery widely patent to the bifurcation. Normal bifurcation. Normal cervical ICA. Left carotid system: Left carotid system similarly normal. Vertebral arteries: Both vertebral artery origins are widely patent. The left is dominant. Both vessels are normal through the cervical region to the foramen magnum. Skeleton: Negative Other neck: No significant finding. Upper chest: Lung apices are clear. Review of the MIP images confirms the above findings CTA HEAD FINDINGS Anterior circulation: Both internal carotid arteries are patent through the skull base and siphon regions. Intracranial detail is limited by motion and artifact from upper extremities. No evidence of vascular occlusion. No aneurysm is identified, but detail is limited. Posterior circulation: Both vertebral arteries are patent through the foramen magnum. Right may terminate in PICA. Left vertebral artery supplies basilar artery. Basilar detail is limited by motion. Flow is present in both posterior cerebral arteries. Venous sinuses: Patent and normal. Anatomic variants: None significant. Review of the MIP images confirms the above findings IMPRESSION: 1. No focal brain insult is seen. There are no visible subarachnoid spaces other than slit like ventricles, raising the possibility of generalized brain swelling or subacute subarachnoid hemorrhage. 2. No evidence of arterial stenosis or occlusion in the neck. 3. Intracranial detail is limited by motion and artifact from upper extremities. No evidence of vascular occlusion. No aneurysm is identified, but detail is limited. Electronically Signed   By: Paulina Fusi M.D.   On: 01/25/2024 14:07     Discharge Exam: Vitals:   02/10/24 0355 02/10/24 0738   BP: (!) 99/55 102/63  Pulse: 64 (!) 58  Resp: 18 16  Temp: 97.8 F (36.6 C) 97.8 F (36.6 C)  SpO2: 98% 97%   Vitals:   02/09/24 2009 02/09/24 2335 02/10/24 0355 02/10/24 0738  BP: 106/70 93/70 (!) 99/55 102/63  Pulse: 66 (!) 57 64 (!) 58  Resp: 16 16 18 16   Temp: 98.2 F (36.8 C) 98 F (36.7 C) 97.8 F (36.6 C) 97.8 F (36.6 C)  TempSrc: Oral Oral Oral Oral  SpO2: 98% 99% 98% 97%  Weight:      Height:        General: Pt is alert, awake, not in acute distress Cardiovascular: RRR, S1/S2 +, no rubs, no gallops Respiratory: CTA bilaterally, no wheezing, no rhonchi Abdominal: Soft, NT, ND, bowel sounds + Extremities: no edema, no cyanosis    The results of significant diagnostics from this hospitalization (including imaging, microbiology, ancillary and laboratory) are listed below for reference.     Microbiology: Recent Results (from the past 240 hours)  Culture, blood (Routine X 2) w Reflex to ID Panel     Status: None   Collection Time: 02/01/24  9:22 PM   Specimen: BLOOD  Result Value Ref Range Status   Specimen  Description BLOOD BLOOD RIGHT FOREARM  Final   Special Requests   Final    BOTTLES DRAWN AEROBIC AND ANAEROBIC Blood Culture results may not be optimal due to an inadequate volume of blood received in culture bottles   Culture   Final    NO GROWTH 5 DAYS Performed at [facility], [address] [place], [place] [address]    Report Status 02/06/2024 FINAL  Final  Culture, blood (Routine X 2) w Reflex to ID Panel     Status: None   Collection Time: 02/01/24  9:23 PM   Specimen: BLOOD  Result Value Ref Range Status   Specimen Description BLOOD BLOOD RIGHT ARM  Final   Special Requests   Final    BOTTLES DRAWN AEROBIC AND ANAEROBIC Blood Culture adequate volume   Culture   Final    NO GROWTH 5 DAYS Performed at [facility], [address], [place], [place] [address]    Report Status 02/06/2024 FINAL  Final  MRSA Next Gen by PCR, Nasal      Status: None   Collection Time: 02/01/24  9:59 PM   Specimen: Nasal Mucosa; Nasal Swab  Result Value Ref Range Status   MRSA by PCR Next Gen NOT DETECTED NOT DETECTED Final    Comment: (NOTE) The GeneXpert MRSA Assay (FDA approved for NASAL specimens only), is one component of a comprehensive MRSA colonization surveillance program. It is not intended to diagnose MRSA infection nor to guide or monitor treatment for MRSA infections. Test performance is not FDA approved in patients less than 95 years old. Performed at [facility], [address] [place], [place] [address]      Labs: BNP (last 3 results) No results for input(s): "BNP" in the last 8760 hours. Basic Metabolic Panel: Recent Labs  Lab 02/04/24 0609 02/05/24 0628 02/06/24 0745 02/10/24 0839  NA 141 139 139 140  K 3.4* 3.5 3.6 3.9  CL 111 109 110 105  CO2 21* 24 24 23   GLUCOSE 96 102* 142* 142*  BUN 12 10 14 16   CREATININE 0.92 0.89 0.91 0.87  CALCIUM 8.4* 8.5* 8.7* 9.6  MG 2.1 1.9 2.2  --   PHOS 3.2 3.0  --   --    Liver Function Tests: No results for input(s): "AST", "ALT", "ALKPHOS", "BILITOT", "PROT", "ALBUMIN" in the last 168 hours. No results for input(s): "LIPASE", "AMYLASE" in the last 168 hours. No results for input(s): "AMMONIA" in the last 168 hours. CBC: Recent Labs  Lab 02/05/24 0628 02/06/24 0745 02/08/24 0648 02/09/24 0630 02/10/24 0514  WBC 6.4 7.4 7.5 7.9 7.6  HGB 11.6* 12.9* 11.9* 12.2* 12.4*  HCT 35.2* 40.2 36.8* 38.1* 38.1*  MCV 83.0 82.9 82.9 84.3 83.2  PLT 217 255 250 147* 257   Cardiac Enzymes: No results for input(s): "CKTOTAL", "CKMB", "CKMBINDEX", "TROPONINI" in the last 168 hours. BNP: Invalid input(s): "POCBNP" CBG: Recent Labs  Lab 02/08/24 0330  GLUCAP 116*   D-Dimer No results for input(s): "DDIMER" in the last 72 hours. Hgb A1c No results for input(s): "HGBA1C" in the last 72 hours. Lipid Profile No results for input(s): "CHOL", "HDL", "LDLCALC",  "TRIG", "CHOLHDL", "LDLDIRECT" in the last 72 hours. Thyroid function studies No results for input(s): "TSH", "T4TOTAL", "T3FREE", "THYROIDAB" in the last 72 hours.  Invalid input(s): "FREET3" Anemia work up No results for input(s): "VITAMINB12", "FOLATE", "FERRITIN", "TIBC", "IRON", "RETICCTPCT" in the last 72 hours. Urinalysis    Component Value Date/Time  COLORURINE AMBER (A) 02/02/2024 0852   APPEARANCEUR CLEAR 02/02/2024 0852   LABSPEC 1.020 02/02/2024 0852   PHURINE 5.5 02/02/2024 0852   GLUCOSEU NEGATIVE 02/02/2024 0852   HGBUR NEGATIVE 02/02/2024 0852   BILIRUBINUR NEGATIVE 02/02/2024 0852   KETONESUR NEGATIVE 02/02/2024 0852   PROTEINUR NEGATIVE 02/02/2024 0852   NITRITE NEGATIVE 02/02/2024 0852   LEUKOCYTESUR NEGATIVE 02/02/2024 0852   Sepsis Labs Recent Labs  Lab 02/06/24 0745 02/08/24 0648 02/09/24 0630 02/10/24 0514  WBC 7.4 7.5 7.9 7.6   Microbiology Recent Results (from the past 240 hours)  Culture, blood (Routine X 2) w Reflex to ID Panel     Status: None   Collection Time: 02/01/24  9:22 PM   Specimen: BLOOD  Result Value Ref Range Status   Specimen Description BLOOD BLOOD RIGHT FOREARM  Final   Special Requests   Final    BOTTLES DRAWN AEROBIC AND ANAEROBIC Blood Culture results may not be optimal due to an inadequate volume of blood received in culture bottles   Culture   Final    NO GROWTH 5 DAYS Performed at [facility], [address], [place], [place] [address]    Report Status 02/06/2024 FINAL  Final  Culture, blood (Routine X 2) w Reflex to ID Panel     Status: None   Collection Time: 02/01/24  9:23 PM   Specimen: BLOOD  Result Value Ref Range Status   Specimen Description BLOOD BLOOD RIGHT ARM  Final   Special Requests   Final    BOTTLES DRAWN AEROBIC AND ANAEROBIC Blood Culture adequate volume   Culture   Final    NO GROWTH 5 DAYS Performed at [facility], [address] [place], [place] [address]    Report Status  02/06/2024 FINAL  Final  MRSA Next Gen by PCR, Nasal     Status: None   Collection Time: 02/01/24  9:59 PM   Specimen: Nasal Mucosa; Nasal Swab  Result Value Ref Range Status   MRSA by PCR Next Gen NOT DETECTED NOT DETECTED Final    Comment: (NOTE) The GeneXpert MRSA Assay (FDA approved for NASAL specimens only), is one component of a comprehensive MRSA colonization surveillance program. It is not intended to diagnose MRSA infection nor to guide or monitor treatment for MRSA infections. Test performance is not FDA approved in patients less than 19 years old. Performed at [facility], [address], [place], [place] [address]     FURTHER DISCHARGE INSTRUCTIONS:   Get Medicines reviewed and adjusted: Please take all your medications with you for your next visit with your Primary MD   Laboratory/radiological data: Please request your Primary MD to go over all hospital tests and procedure/radiological results at the follow up, please ask your Primary MD to get all Hospital records sent to his/her office.   In some cases, they will be blood work, cultures and biopsy results pending at the time of your discharge. Please request that your primary care M.D. goes through all the records of your hospital data and follows up on these results.   Also Note the following: If you experience worsening of your admission symptoms, develop shortness of breath, life threatening emergency, suicidal or homicidal thoughts you must seek medical attention immediately by calling 911 or calling your MD immediately  if symptoms less severe.   You must read complete instructions/literature along with all the possible adverse reactions/side effects for all the Medicines you take and that have  been prescribed to you. Take any new Medicines after you have completely understood and accpet all the possible adverse reactions/side effects.    Do not drive when taking Pain medications or sleeping medications  (Benzodaizepines)   Do not take more than prescribed Pain, Sleep and Anxiety Medications. It is not advisable to combine anxiety,sleep and pain medications without talking with your primary care practitioner   Special Instructions: If you have smoked or chewed Tobacco  in the last 2 yrs please stop smoking, stop any regular Alcohol  and or any Recreational drug use.   Wear Seat belts while driving.   Please note: You were cared for by a hospitalist during your hospital stay. Once you are discharged, your primary care physician will handle any further medical issues. Please note that NO REFILLS for any discharge medications will be authorized once you are discharged, as it is imperative that you return to your primary care physician (or establish a relationship with a primary care physician if you do not have one) for your post hospital discharge needs so that they can reassess your need for medications and monitor your lab values  Time coordinating discharge: Over 30 minutes  SIGNED:   Hughie Closs, MD  [facility] 02/10/2024, 10:54 AM *Please note that this is a verbal dictation therefore any spelling or grammatical errors are due to the "[employer]agon [employer] One" system interpretation. If 7PM-7AM, please contact night-coverage [email or web address]

## 2024-02-19 NOTE — Therapy (Signed)
 OUTPATIENT PHYSICAL THERAPY NEURO EVALUATION   Patient Name: Samuel Warren MRN: [id number] DOB:1991/09/25, 33 y.o., male Today's Date: 02/20/2024   PCP: No PCP REFERRING PROVIDER: Hughie Closs, MD   END OF SESSION:  PT End of Session - 02/20/24 1021     Visit Number 1    Number of Visits 9    Date for PT Re-Evaluation 04/20/24   due to potential delay in scheduling   Authorization Type [employer]    Authorization Time Period VL: 23 PT; 23 OT    PT Start Time 1015    PT Stop Time 1058    PT Time Calculation (min) 43 min    Equipment Utilized During Treatment Gait belt    Activity Tolerance Patient tolerated treatment well   limited by feelings of dizziness/needing to lay down to regain strength (per pt's brother)   Behavior During Therapy WFL for tasks assessed/performed             Past Medical History:  Diagnosis Date   Deaf    Past Surgical History:  Procedure Laterality Date   IR ANGIO INTRA EXTRACRAN SEL COM CAROTID INNOMINATE BILAT MOD SED  02/02/2024   IR THROMBECT VENO MECH MOD SED  02/02/2024   IR VENO/JUGULAR LEFT  02/02/2024   RADIOLOGY WITH ANESTHESIA N/A 02/02/2024   Procedure: RADIOLOGY WITH ANESTHESIA;  Surgeon: Julieanne Cotton, MD;  Location: [facility] OR;  Service: Radiology;  Laterality: N/A;   Patient Active Problem List   Diagnosis Date Noted   Hyperlipidemia 02/10/2024   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   Acute cerebral venous sinus thrombosis 02/01/2024   Acute encephalopathy 01/25/2024   Headache 01/25/2024   Neck pain 01/25/2024   Leukocytosis 01/25/2024    ONSET DATE: 02/10/2024   REFERRING DIAG: G08 (ICD-10-CM) - Acute cerebral venous sinus thrombosis  THERAPY DIAG:  Unsteadiness on feet  Other abnormalities of gait and mobility  Muscle weakness (generalized)  Other symptoms and signs involving the nervous system  Rationale for Evaluation and Treatment: Rehabilitation  SUBJECTIVE:                                                                                                                                                                                              SUBJECTIVE STATEMENT: Discharged home from the hospital on 02/10/24. No in person interpreter present, pt's brother states that he will interpret. Pt is deaf and uses minimal ASL. Brother provides all history, at home pt does not use any kind of RW. Will walk until he gets dizzy and has to lay down. Pt seated in waiting room with no AD and  PT needed to grab RW to ambulate pt back to clinic room. Pt with significantly decr R foot clearance and PT need to provide CGA. Pt's brother then stating once in room that pt needs to lay down to get his strength back. Pt reports when he sits for a while, will lose all his strength.  Pt's brother reports that everytime he lays down he gets his strength and his balance. No falls. Pt does all of his bathing and dressing independently. Prior to hospitalization, was independent.   When coming up from table, pt motioning that he is feeling dizzy   Pt accompanied by:  Dorian Heckle   PERTINENT HISTORY: 33 yo male 02/01/24 with Rt sided weakness. CT head revealed multiple thrombosed cerebral veins including the SSS, straight sinus, inferior sagittal sinus and both transverse sinuses left worse than right. 3/24 S/p mechanical thrombectomy. 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 TIA, HLD, deaf  Pt underwent mechanical thrombectomy for clot extraction by Dr. Corliss Skains 3/24. He was able to open up left transverse and sigmoid sinuses but the clot was quite thick and he was unable to the superior sagittal sinus and transverse and sigmoid sinus on the right.   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   PAIN:  Are you having pain? Yes, pt reports pain in back of head, and numbness in entire body   Vitals:   02/20/24 1035 02/20/24 1040  BP: 100/73  93/67  Pulse: 80 85    Sitting, Standing   PRECAUTIONS: Fall  RED FLAGS: None   FALLS: Has patient fallen in last 6 months? No  LIVING ENVIRONMENT: Lives with: lives with their family and lives with his mom, dad, brother, wife, and 3 kids  Lives in: House/apartment Stairs: Yes: External: 6 or 7 steps; can reach both, per brother pt has no difficulties with stairs  Has following equipment at home: Dan Humphreys - 2 wheeled and sits in the tub   PLOF: Independent  PATIENT GOALS: Per brother: wants to to get better on his right side and his head   OBJECTIVE:  Note: Objective measures were completed at Evaluation unless otherwise noted.  COGNITION: Overall cognitive status: Difficulty to assess due to: pt is deaf and uses minimal ASL, no in person interpreter present during eval, needing to use pt's brother, PT able to communicate with gestures    SENSATION: Pt reports bilateral numbness in both legs, more on the R side   COORDINATION: Heel to shin: slower to perform with RLE    POSTURE: rounded shoulders, forward head, and posterior pelvic tilt  LOWER EXTREMITY ROM:    Decr with RLE due to weakness.   LOWER EXTREMITY MMT:    MMT Right Eval Left Eval  Hip flexion 4- 4+  Hip extension    Hip abduction    Hip adduction    Hip internal rotation    Hip external rotation    Knee flexion 3+ 4  Knee extension 4+ 4+  Ankle dorsiflexion 4+ 5  Ankle plantarflexion    Ankle inversion    Ankle eversion    (Blank rows = not tested)  BED MOBILITY:  Findings: Sit to supine Modified independence and Supine to sit Modified independence  TRANSFERS: Sit to stand: SBA  Assistive device utilized: None     Stand to sit: SBA  Assistive device utilized: None      STAIRS:  Level of Assistance: SBA and CGA  Stair Negotiation Technique: Alternating  Pattern  with Bilateral Rails  Number of Stairs: 4   Height of Stairs: 6"   Comments: pt taking incr time when descending    GAIT: Gait pattern: step through pattern, decreased step length- Right, decreased stride length, decreased hip/knee flexion- Right, decreased ankle dorsiflexion- Right, trunk flexed, and poor foot clearance- Right Distance walked: Clinic distances  Assistive device utilized: Environmental consultant - 2 wheeled Level of assistance: SBA and CGA Comments: When pt ambulating into clinic, pt with decr R foot clearance and catching his toe with R foot frequently and needing CGA, after pt laid down during eval, pt did demo improve foot clearance during gait with less catching of R toe    FUNCTIONAL TESTS:  5 times sit to stand: 14.4 seconds with no UE support  10 meter walk test: 17 seconds with RW =1.93 ft/sec                                                                                                                                 TREATMENT DATE:   Self-Care:   Discussed using a RW at all times in the home due to fall risk and decr RLE foot clearance  Gave information about scheduling appt with [facility] for neurologist appt, gave phone number to pt's brother who is planning to call and make appt    PATIENT EDUCATION: Education details: See self-care section above, clinical findings, POC  Person educated: Patient and pt's brother  Education method: Explanation and Verbal cues Education comprehension: verbalized understanding, returned demonstration, and tactile cues required  HOME EXERCISE PROGRAM: Will provide at future session   GOALS: Goals reviewed with patient? Yes  SHORT TERM GOALS: ALL STGS = LTGS  LONG TERM GOALS: Target date: 03/19/2024  Pt will be independent with final HEP with family assistance in order to build upon functional gains made in therapy. Baseline:  Goal status: INITIAL  2.  Pt will improve 5x sit<>stand to less than or equal to 12 sec to demonstrate improved functional strength and transfer efficiency.   Baseline: 14.4 seconds with no UE support  Goal status:  INITIAL  3.  Pt will be able to ambulate at least 500' with LRAD vs. No AD and supervision in order to return to PLOF.  Baseline:  Goal status: INITIAL  4.  Pt will improve gait speed with LRAD to at least 2.5 ft/sec in order to demo improved community mobility.  Baseline: 17 seconds with RW =1.93 ft/sec  Goal status: INITIAL    ASSESSMENT:  CLINICAL IMPRESSION: Patient is a 33 year old male referred to Neuro OPPT for Acute cerebral venous sinus thrombosis.  Pt presented on 02/01/24 with Rt sided weakness. CT head revealed multiple thrombosed cerebral veins including the SSS, straight sinus, inferior sagittal sinus and both transverse sinuses left worse than right. 3/24 S/p mechanical thrombectomy. 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 TIA, HLD, deaf. The following deficits  were present during the exam: decr strength, decr activity tolerance, pain, gait abnormalities, impaired balance, impaired coordination. Based on gait speed, pt is an incr risk for falls. Educated to make sure that pt uses the RW at home. Pt would benefit from skilled PT to address these impairments and functional limitations to maximize functional mobility independence    OBJECTIVE IMPAIRMENTS: Abnormal gait, decreased activity tolerance, decreased balance, decreased coordination, decreased endurance, decreased knowledge of condition, decreased knowledge of use of DME, difficulty walking, decreased ROM, decreased strength, dizziness, impaired sensation, and pain.   ACTIVITY LIMITATIONS: stairs, transfers, locomotion level, and caring for others  PARTICIPATION LIMITATIONS: meal prep, cleaning, driving, shopping, and community activity  PERSONAL FACTORS: Past/current experiences and Time since onset of injury/illness/exacerbation are also affecting patient's functional outcome.   REHAB POTENTIAL: Good  CLINICAL DECISION MAKING: Evolving/moderate  complexity  EVALUATION COMPLEXITY: Moderate  PLAN:  PT FREQUENCY: 2x/week  PT DURATION: 8 weeks - only anticipate 4 weeks   PLANNED INTERVENTIONS: 97164- PT Re-evaluation, 97110-Therapeutic exercises, 97530- Therapeutic activity, 97112- Neuromuscular re-education, 97535- Self Care, [address]- Manual therapy, [phone]- Gait training, Patient/Family education, Balance training, Stair training, and DME instructions  PLAN FOR NEXT SESSION: initiate HEP for pt to perform with family for RLE strength, work on gait training, RLE strength, pt is deaf and uses minimal ASL. No translator during eval, so utilized pt's brother to try to help. PT also communicating with pt with gesture cues    Drake Leach, PT, DPT 02/20/2024, 11:24 AM

## 2024-02-20 ENCOUNTER — Other Ambulatory Visit: Payer: Self-pay

## 2024-02-20 ENCOUNTER — Ambulatory Visit: Admitting: Occupational Therapy

## 2024-02-20 ENCOUNTER — Ambulatory Visit: Attending: Family Medicine | Admitting: Physical Therapy

## 2024-02-20 ENCOUNTER — Encounter: Payer: Self-pay | Admitting: Physical Therapy

## 2024-02-20 VITALS — BP 93/67 | HR 85

## 2024-02-20 DIAGNOSIS — R41840 Attention and concentration deficit: Secondary | ICD-10-CM

## 2024-02-20 DIAGNOSIS — M6281 Muscle weakness (generalized): Secondary | ICD-10-CM

## 2024-02-20 DIAGNOSIS — R278 Other lack of coordination: Secondary | ICD-10-CM | POA: Insufficient documentation

## 2024-02-20 DIAGNOSIS — R2689 Other abnormalities of gait and mobility: Secondary | ICD-10-CM | POA: Diagnosis present

## 2024-02-20 DIAGNOSIS — R2681 Unsteadiness on feet: Secondary | ICD-10-CM | POA: Diagnosis present

## 2024-02-20 DIAGNOSIS — R29818 Other symptoms and signs involving the nervous system: Secondary | ICD-10-CM | POA: Diagnosis present

## 2024-02-20 DIAGNOSIS — G08 Intracranial and intraspinal phlebitis and thrombophlebitis: Secondary | ICD-10-CM | POA: Diagnosis not present

## 2024-02-20 NOTE — Therapy (Signed)
 OUTPATIENT OCCUPATIONAL THERAPY NEURO EVALUATION  Patient Name: Samuel Warren MRN: [id number] DOB:[birth year], 33 y.o., male Today's Date: 02/20/2024  PCP: No PCP  REFERRING PROVIDER: Hughie Closs, MD  END OF SESSION:  OT End of Session - 02/20/24 1259     Visit Number 1    Number of Visits 9   including eval   Date for OT Re-Evaluation 03/26/24    Authorization Type [employer] 2025, VL: 23 OT    OT Start Time 1100    OT Stop Time 1136    OT Time Calculation (min) 36 min    Activity Tolerance Patient tolerated treatment well;Other (comment)   pt sometimes limited by dizziness   Behavior During Therapy [facility] for tasks assessed/performed             Past Medical History:  Diagnosis Date   Deaf    Past Surgical History:  Procedure Laterality Date   IR ANGIO INTRA EXTRACRAN SEL COM CAROTID INNOMINATE BILAT MOD SED  02/02/2024   IR THROMBECT VENO MECH MOD SED  02/02/2024   IR VENO/JUGULAR LEFT  02/02/2024   RADIOLOGY WITH ANESTHESIA N/A 02/02/2024   Procedure: RADIOLOGY WITH ANESTHESIA;  Surgeon: Samuel Cotton, MD;  Location: [facility] OR;  Service: Radiology;  Laterality: N/A;   Patient Active Problem List   Diagnosis Date Noted   Hyperlipidemia 02/10/2024   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   Acute cerebral venous sinus thrombosis 02/01/2024   Acute encephalopathy 01/25/2024   Headache 01/25/2024   Neck pain 01/25/2024   Leukocytosis 01/25/2024    ONSET DATE: 02/10/2024 (referral date)  REFERRING DIAG: G08 (ICD-10-CM) - Acute cerebral venous sinus thrombosis  THERAPY DIAG:  Muscle weakness (generalized)  Attention and concentration deficit  Other lack of coordination  Unsteadiness on feet  Rationale for Evaluation and Treatment: Rehabilitation  SUBJECTIVE:   SUBJECTIVE STATEMENT: Note: Pt's PMH significant for deafness, uses sign language.  PT updated OT that pt's BP was stable for duration of PT eval, which was completed prior to today's OT eval.  Pt's  brother provided most of subjective info and sometimes interpreted sign language from pt:  Pt's brother reported pt experiences dizziness. Pt often off-balance. Pt reported decreased strength on R hand and difficulty with sleeping, experiencing dizziness and headaches when going to sleep.   Pt accompanied by: self, brother and young nephew  PERTINENT HISTORY: Deafness, Hyperlipidemia, dural venous sinus thrombosis, acute cerebral venous sinus thrombosis, acute encephalopathy, headache, dizziness, neck pain, leukocytosis  Per 02/10/24 D/C Summary: "Samuel Warren is a 33 y.o. 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 CT head revealed multiple thrombosed cerebral veins including the SSS, straight sinus, inferior sagittal sinus and both transverse sinuses left worse than right. Thrombus extended into the left sigmoid sinus and visualized left internal jugular vein. Innumerable enlarged and tortuous cortical veins are visualized, consistent with collateral venous drainage. Enlarged left petrosal veins are present in the middle cranial fossa. Pt underwent mechanical thrombectomy for clot extraction by Dr. Corliss Warren 3/24. He was able to open up left transverse and sigmoid sinuses but the clot was quite thick and he was unable to the superior sagittal sinus and transverse and sigmoid sinus on the right. PMH: deaf, recent admission to the hospital from 3/16-3/20 for acute  meningoencephalitis which was treated with antibiotics, TIA, hyperlipidemia"  PRECAUTIONS: Fall  WEIGHT BEARING RESTRICTIONS: No  PAIN:  Are you having pain? Yes: NPRS scale: Pt pointed to 8/10 on Faces pain scale Pain location: pt gestured to R leg, R side of torso, and R  arm Pain description: numbness, tingling Aggravating factors: sitting or walking for long periods of time Relieving factors: lying down  FALLS: Has patient fallen in last 6 months? No  LIVING ENVIRONMENT: Lives with: lives with their family Lives in: House/apartment Stairs: Yes: External: 6 steps;  Has following equipment at home: Walker - 2 wheeled  PLOF: Independent with basic ADLs, fishing/hunting, Occupation: Location manager, driving  PATIENT GOALS: decrease dizziness and get strength back to R side  OBJECTIVE:  Note: Objective measures were completed at Evaluation unless otherwise noted.  HAND DOMINANCE: Right  ADLs: Overall ADLs: modI Transfers/ambulation related to ADLs: Eating: ind, may lose balance when seated and therefore pt's wife assists Grooming: ind toothbrushing, not yet attempted shaving UB Dressing: ind LB Dressing: ind Toileting: ind Bathing: pt's brother unsure, pt's spouse and mother is with pt during the day/night Tub Shower transfers: pt's brother unsure   IADLs: dependent Meal prep - pt's family concerned about safety d/t pt on blood thinner medication and therefore pt does not cut with knife for meal prep  Handwriting - 100% legible to sign name and to write simple phrase. OT noted decreased quality of handwriting when pt wrote simple phrase after writing name though legibility was not impacted. Pt's brother reported pt's hand sometimes demo's shaking when trying to sign name or writing for longer periods of time.   MOBILITY STATUS:  2WW recommended by hospital staff and during PT eval today though pt does not use 2WW at home per pt's brother's report.  POSTURE COMMENTS:  Sitting ind until pt needed to lie down on mat. Pt's brother reported pt sometimes loses strength in R leg and R leg becomes numb/tingling and then pt may lose balance with sitting.  ACTIVITY TOLERANCE: Activity tolerance: dizziness spells and pt then has to lay  down. Pt able  to tolerate upright sitting for approx. 15-20 minutes then needed to lie down on mat during session. Pt's brother reported pt can tolerate sitting and walking for a few minutes and then needs to lay down at home as well.  FUNCTIONAL OUTCOME MEASURES: PSFS:  6.3   Total score = sum of the activity scores/number of activities Minimum detectable change (90%CI) for average score = 2 points Minimum detectable change (90%CI) for single activity score = 3 points   UPPER EXTREMITY ROM:    Active ROM Right eval Left eval  Shoulder flexion [facility]  Shoulder abduction    Shoulder adduction    Shoulder extension    Shoulder internal rotation    Shoulder external rotation    Elbow flexion    Elbow extension    Wrist flexion    Wrist extension    Wrist ulnar deviation    Wrist radial deviation    Wrist pronation    Wrist supination    (Blank rows = not tested)  UPPER EXTREMITY MMT:     MMT Right eval Left eval  Shoulder flexion 4 4  Shoulder abduction    Shoulder adduction    Shoulder extension    Shoulder internal rotation    Shoulder external rotation    Middle trapezius    Lower trapezius    Elbow flexion    Elbow extension  Wrist flexion    Wrist extension    Wrist ulnar deviation    Wrist radial deviation    Wrist pronation    Wrist supination    (Blank rows = not tested)  HAND FUNCTION: Grip strength: Right: 57.5, 54.4, 56.4 (56.1 lbs average) lbs; Left: 78.9, 64.3, 55.3 (66.2 lbs average) lbs  Downward trend of grip strength noted during subsequent trials with LUE, indicating potential fatigue.  COORDINATION: 9 Hole Peg test: Right: 32 sec; Left: 28 sec  SENSATION: Impaired.   Numbness and tingling on R side, including RUE and RLE.  Pt reported pounding sensation around circumference of head when sleeping.  EDEMA: none noted  MUSCLE TONE: RUE: Within functional limits and LUE: Within functional limits  COGNITION: Overall cognitive status: Difficult  to fully assess. Pt wrote name and followed at least 1-step and 2-step instructions.  Pt seemed to answer questions appropriately. Pt's brother did not consistently interpret all information though pt remained engaged in therapy session for duration.  VISION: Not tested. To be further assessed during functional tasks PRN.  VISION ASSESSMENT: Not tested  PERCEPTION: Not tested  PRAXIS: Not tested  OBSERVATIONS: Pt was pleasant and ambulated with 2WW provided by clinic during PT eval, which was completed prior to OT eval. Pt demo'd fair gait using 2WW though sometimes appeared unsteady. Pt's brother did not consistently interpret all information though pt remained engaged in therapy session for duration. Pt tolerated sitting for approx. 15-20 minutes and then had to lay down for several minutes secondary to dizziness.                                                                                                                             TREATMENT DATE: 02/20/24   OT educated pt and pt's family on OT role, POC, clinic late/no-show policy. Pt's brother acknowledged understanding.   PATIENT EDUCATION: Education details: see today's tx above Person educated: Patient and family members Education method: Explanation Education comprehension: verbalized understanding  HOME EXERCISE PROGRAM: TBD  GOALS: Goals reviewed with patient? Yes  SHORT TERM GOALS: Target date: 03/05/24  With visual handouts, pt will return demo of BUE HEP. Baseline: new to outpt OT Goal status: INITIAL  2.  Pt will demo improved dominant R hand endurance as evidenced by writing at least 2 sentences with no change in quality of handwriting.  Baseline: Handwriting - 100% legible to sign name and to write simple phrase. OT noted decreased quality of handwriting when pt wrote simple phrase after writing name though legibility was not impacted. Pt's brother reported pt's hand sometimes demo's shaking when trying to sign  name or writing for longer periods of time. Goal status: INITIAL  LONG TERM GOALS: Target date: 03/26/24  Pt will demo improved activity tolerance as evidenced by tolerating upright sitting position for duration of an entire OT session (approx. 40 minutes) Baseline: Pt tolerated sitting for approx. 15-20 minute and then had to lie down  on mat for several minutes d/t dizziness. Goal status: INITIAL  2.  Patient will demo improved FM coordination as evidenced by completing nine-hole peg with use of dominant RUE in 28 seconds or less.  Baseline: 9 Hole Peg test: Right: 32 sec; Left: 28 sec Goal status: INITIAL  3.  Patient will demonstrate improved LUE endurance by maintaining at least 64 lbs LUE grip strength across all 3 trials as needed to open jars and other containers.  Baseline: Grip strength: Left: 78.9, 64.3, 55.3 (66.2 lbs average) lbs. Downward trend of grip strength noted during subsequent trials with LUE, indicating potential fatigue. Goal status: INITIAL  4.  Patient will demonstrate at least 60 lbs RUE grip strength, average of 3 trials, as needed to open jars and other containers.  Baseline: Grip strength: Right: 57.5, 54.4, 56.4 (56.1 lbs average) lbs Goal status: INITIAL  ASSESSMENT:  CLINICAL IMPRESSION: Patient is a 33 y.o. male who was seen today for occupational therapy evaluation for Acute cerebral venous sinus thrombosis. PMH includes Deafness, Hyperlipidemia, dural venous sinus thrombosis, acute cerebral venous sinus thrombosis, acute encephalopathy, headache, dizziness, neck pain, leukocytosis. Patient currently presents below baseline level of functioning demonstrating functional deficits and impairments as noted below. Pt would benefit from skilled OT services in the outpatient setting to work on impairments as noted below.  PERFORMANCE DEFICITS: in functional skills including ADLs, IADLs, coordination, dexterity, proprioception, sensation, strength, flexibility, Fine  motor control, Gross motor control, mobility, balance, body mechanics, endurance, and UE functional use, cognitive skills including attention and energy/drive, and psychosocial skills including environmental adaptation.   IMPAIRMENTS: are limiting patient from ADLs, IADLs, rest and sleep, work, leisure, and social participation.   CO-MORBIDITIES: may have co-morbidities  that affects occupational performance. Patient will benefit from skilled OT to address above impairments and improve overall function.  MODIFICATION OR ASSISTANCE TO COMPLETE EVALUATION: Min-Moderate modification of tasks or assist with assess necessary to complete an evaluation.  OT OCCUPATIONAL PROFILE AND HISTORY: Detailed assessment: Review of records and additional review of physical, cognitive, psychosocial history related to current functional performance.  CLINICAL DECISION MAKING: Moderate - several treatment options, min-mod task modification necessary  REHAB POTENTIAL: Good  EVALUATION COMPLEXITY: Moderate    PLAN:  OT FREQUENCY: 1-2x/week  OT DURATION: 4 weeks  PLANNED INTERVENTIONS: 97168 OT Re-evaluation, 97535 self care/ADL training, 40981 therapeutic exercise, 97530 therapeutic activity, 97112 neuromuscular re-education, 97140 manual therapy, 97035 ultrasound, 97018 paraffin, 19147 fluidotherapy, 97760 Orthotic Initial, [id number] Prosthetic Initial, [id number] Orthotic/Prosthetic subsequent, passive range of motion, functional mobility training, visual/perceptual remediation/compensation, energy conservation, patient/family education, and DME and/or AE instructions  RECOMMENDED OTHER SERVICES: PT eval already completed.  CONSULTED AND AGREED WITH PLAN OF CARE: Patient and family member/caregiver  PLAN FOR NEXT SESSION:  Recommended to complete session at mat table in case pt needs to lie down secondary to dizziness.  Theraputty HEP FM coordination HEP Work on sitting tolerance Handwriting  endurance/practice   Wynetta Emery, OT 02/20/2024, 1:02 PM

## 2024-02-26 ENCOUNTER — Ambulatory Visit: Admitting: Occupational Therapy

## 2024-02-26 ENCOUNTER — Ambulatory Visit

## 2024-02-26 VITALS — BP 96/62

## 2024-02-26 DIAGNOSIS — M6281 Muscle weakness (generalized): Secondary | ICD-10-CM

## 2024-02-26 DIAGNOSIS — R2689 Other abnormalities of gait and mobility: Secondary | ICD-10-CM

## 2024-02-26 DIAGNOSIS — R278 Other lack of coordination: Secondary | ICD-10-CM

## 2024-02-26 DIAGNOSIS — R2681 Unsteadiness on feet: Secondary | ICD-10-CM

## 2024-02-26 DIAGNOSIS — R29818 Other symptoms and signs involving the nervous system: Secondary | ICD-10-CM

## 2024-02-26 NOTE — Therapy (Signed)
 OUTPATIENT PHYSICAL THERAPY NEURO TREATMENT   Patient Name: Samuel Warren MRN: [id number] DOB:[birth year], 33 y.o., male Today's Date: 02/26/2024   PCP: No PCP REFERRING PROVIDER: Modena Andes, MD   END OF SESSION:  PT End of Session - 02/26/24 1449     Visit Number 2    Number of Visits 9    Date for PT Re-Evaluation 04/20/24    Authorization Type [employer]    Authorization Time Period VL: 23 PT; 23 OT    PT Start Time 1445    PT Stop Time 1528    PT Time Calculation (min) 43 min    Equipment Utilized During Treatment Gait belt    Activity Tolerance Patient tolerated treatment well    Behavior During Therapy WFL for tasks assessed/performed             Past Medical History:  Diagnosis Date   Deaf    Past Surgical History:  Procedure Laterality Date   IR ANGIO INTRA EXTRACRAN SEL COM CAROTID INNOMINATE BILAT MOD SED  02/02/2024   IR THROMBECT VENO MECH MOD SED  02/02/2024   IR VENO/JUGULAR LEFT  02/02/2024   RADIOLOGY WITH ANESTHESIA N/A 02/02/2024   Procedure: RADIOLOGY WITH ANESTHESIA;  Surgeon: Luellen Sages, MD;  Location: [facility] OR;  Service: Radiology;  Laterality: N/A;   Patient Active Problem List   Diagnosis Date Noted   Hyperlipidemia 02/10/2024   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   Acute cerebral venous sinus thrombosis 02/01/2024   Acute encephalopathy 01/25/2024   Headache 01/25/2024   Neck pain 01/25/2024   Leukocytosis 01/25/2024    ONSET DATE: 02/10/2024   REFERRING DIAG: G08 (ICD-10-CM) - Acute cerebral venous sinus thrombosis  THERAPY DIAG:  Muscle weakness (generalized)  Other lack of coordination  Unsteadiness on feet  Other abnormalities of gait and mobility  Rationale for Evaluation and Treatment: Rehabilitation  SUBJECTIVE:                                                                                                                                                                                              SUBJECTIVE STATEMENT: Patient arrives to clinic with brother, using RW. Ambulating back to mat table with brother assist. Per brother, patient needing to lay down to regain his energy. ASL interpretor arriving partway through. Per brother, patient will get sudden weakness in L UE/LE if he sits for too long. Per patient, he has had an onset of L hemi body weakness starting yesterday. Per brother, he has no L hemibody weakness and everything has been status quo functionally since he left the hospital.  Pt accompanied by:  Brother Darcella Gasman   PERTINENT HISTORY: 33 yo male 02/01/24 with Rt sided weakness. CT head revealed multiple thrombosed cerebral veins including the SSS, straight sinus, inferior sagittal sinus and both transverse sinuses left worse than right. 3/24 S/p mechanical thrombectomy. 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 TIA, HLD, deaf  Pt underwent mechanical thrombectomy for clot extraction by Dr. Corliss Skains 3/24. He was able to open up left transverse and sigmoid sinuses but the clot was quite thick and he was unable to the superior sagittal sinus and transverse and sigmoid sinus on the right.   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   PAIN:  Are you having pain? Yes, pt reports pain in back of head, and numbness in entire body   Vitals:   02/26/24 1612  BP: 96/62    PRECAUTIONS: Fall   PATIENT GOALS: Per brother: wants to to get better on his right side and his head                                                                                                                               TREATMENT DATE:  Therex: -prescribed HEP (see below)   Gait: -98ft RW + CGA  -decreased B foot clearance R>L, NBOS, forward flexed posture with increased reliance on B UE on RW  PATIENT EDUCATION: Education details: initial HEP, follow up with PCP re: fluctuating N/T and weakness Person educated: Patient  and pt's brother  Education method: Explanation and Verbal cues Education comprehension: verbalized understanding, returned demonstration, and tactile cues required  HOME EXERCISE PROGRAM: Access Code: [email or web address] URL: [email or web address] Date: 02/26/2024 Prepared by: Merry Lofty  Exercises - Seated Long Arc Quad  - 1 x daily - 7 x weekly - 3 sets - 10 reps - Seated March  - 1 x daily - 7 x weekly - 3 sets - 10 reps - Seated Hip Abduction  - 1 x daily - 7 x weekly - 3 sets - 10 reps - Sit to Stand with Counter Support  - 1 x daily - 7 x weekly - 3 sets - 5 reps  GOALS: Goals reviewed with patient? Yes  SHORT TERM GOALS: ALL STGS = LTGS  LONG TERM GOALS: Target date: 03/19/2024  Pt will be independent with final HEP with family assistance in order to build upon functional gains made in therapy. Baseline:  Goal status: INITIAL  2.  Pt will improve 5x sit<>stand to less than or equal to 12 sec to demonstrate improved functional strength and transfer efficiency.   Baseline: 14.4 seconds with no UE support  Goal status: INITIAL  3.  Pt will be able to ambulate at least 500' with LRAD vs. No AD and supervision in order to return to PLOF.  Baseline:  Goal status: INITIAL  4.  Pt will improve gait speed with LRAD to at least 2.5 ft/sec in order to demo improved community mobility.  Baseline:  17 seconds with RW =1.93 ft/sec  Goal status: INITIAL    ASSESSMENT:  CLINICAL IMPRESSION: Patient seen for skilled PT session with emphasis on establishing initial HEP and patient education. Brother supplementing a majority of patients history and reporting when patient is fatigued/needs rest vs patient advocating for this. Through gesturing of Blane Bunting faces, patient indicating a significant R sided HA. Both patient and brother mentioning fluctuating neuro symptoms of N/T and weakness, but both reporting opposite sides effected. Patient did initially appear to have reduced R  LE strength compared to L LE; however, L UE appeared weaker than R. He required consistent multimodal cues to complete various therapeutic tasks. PT questioning inattention vs limited understanding of tasks. Continue POC.     OBJECTIVE IMPAIRMENTS: Abnormal gait, decreased activity tolerance, decreased balance, decreased coordination, decreased endurance, decreased knowledge of condition, decreased knowledge of use of DME, difficulty walking, decreased ROM, decreased strength, dizziness, impaired sensation, and pain.   ACTIVITY LIMITATIONS: stairs, transfers, locomotion level, and caring for others  PARTICIPATION LIMITATIONS: meal prep, cleaning, driving, shopping, and community activity  PERSONAL FACTORS: Past/current experiences and Time since onset of injury/illness/exacerbation are also affecting patient's functional outcome.   REHAB POTENTIAL: Good  CLINICAL DECISION MAKING: Evolving/moderate complexity  EVALUATION COMPLEXITY: Moderate  PLAN:  PT FREQUENCY: 2x/week  PT DURATION: 8 weeks - only anticipate 4 weeks   PLANNED INTERVENTIONS: 97164- PT Re-evaluation, 97110-Therapeutic exercises, 97530- Therapeutic activity, 97112- Neuromuscular re-education, 97535- Self Care, 28413- Manual therapy, [phone]- Gait training, Patient/Family education, Balance training, Stair training, and DME instructions  PLAN FOR NEXT SESSION: gait training, RLE strength, pt is deaf and uses minimal ASL. PT also communicating with pt with gesture cues    Rebecca Campus, PT Rebecca Campus, PT, DPT, CBIS  02/26/2024, 4:17 PM

## 2024-02-26 NOTE — Therapy (Signed)
 OUTPATIENT OCCUPATIONAL THERAPY NEURO TREATMENT  **Samuel Warren with [facility], interpreting in ASL for patient in person**   Patient Name: Samuel Warren MRN: [id number] DOB:[birth year], 33 y.o., male Today's Date: 02/26/2024  PCP: No PCP  REFERRING PROVIDER: Hughie Closs, MD  END OF SESSION:  OT End of Session - 02/26/24 1536     Visit Number 2    Number of Visits 9   including eval   Date for OT Re-Evaluation 03/26/24    Authorization Type [employer] 2025, VL: 23 OT    OT Start Time 1536    OT Stop Time 1615    OT Time Calculation (min) 39 min    Activity Tolerance Patient tolerated treatment well;Other (comment)   pt sometimes limited by dizziness   Behavior During Therapy [facility] for tasks assessed/performed            Past Medical History:  Diagnosis Date   Deaf    Past Surgical History:  Procedure Laterality Date   IR ANGIO INTRA EXTRACRAN SEL COM CAROTID INNOMINATE BILAT MOD SED  02/02/2024   IR THROMBECT VENO MECH MOD SED  02/02/2024   IR VENO/JUGULAR LEFT  02/02/2024   RADIOLOGY WITH ANESTHESIA N/A 02/02/2024   Procedure: RADIOLOGY WITH ANESTHESIA;  Surgeon: Samuel Cotton, MD;  Location: [facility] OR;  Service: Radiology;  Laterality: N/A;   Patient Active Problem List   Diagnosis Date Noted   Hyperlipidemia 02/10/2024   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   Acute cerebral venous sinus thrombosis 02/01/2024   Acute encephalopathy 01/25/2024   Headache 01/25/2024   Neck pain 01/25/2024   Leukocytosis 01/25/2024    ONSET DATE: 02/10/2024 (referral date)  REFERRING DIAG: G08 (ICD-10-CM) - Acute cerebral venous sinus thrombosis  THERAPY DIAG:  Muscle weakness (generalized)  Other lack of coordination  Other symptoms and signs involving the nervous system  Rationale for Evaluation and Treatment: Rehabilitation  SUBJECTIVE:   SUBJECTIVE STATEMENT: Note: Pt's PMH significant for deafness, uses sign language.  Pt used to play cards but does not have a deck at home.  Brother reports they might be able to get a deck for him.   Pt accompanied by: self, brother   PERTINENT HISTORY: Deafness, Hyperlipidemia, dural venous sinus thrombosis, acute cerebral venous sinus thrombosis, acute encephalopathy, headache, dizziness, neck pain, leukocytosis  Per 02/10/24 D/C Summary: "Samuel Warren is a 33 y.o. 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 CT head revealed multiple thrombosed cerebral veins including the SSS, straight sinus, inferior sagittal sinus and both transverse sinuses left worse than right. Thrombus extended into the left sigmoid sinus and visualized left internal jugular vein. Innumerable enlarged and tortuous cortical veins are visualized, consistent with collateral venous drainage. Enlarged left petrosal veins are present in the middle cranial fossa. Pt underwent mechanical thrombectomy for clot extraction by Dr. Corliss Warren 3/24. He was able to open up left transverse and sigmoid sinuses but the clot was quite thick and he was unable to the superior sagittal sinus and transverse and sigmoid sinus on the right. PMH: deaf, recent admission to the hospital from 3/16-3/20 for acute meningoencephalitis which was treated with antibiotics, TIA, hyperlipidemia"  PRECAUTIONS: Fall  WEIGHT BEARING RESTRICTIONS: No  PAIN:  Are you having pain? Yes: NPRS scale: Pt pointed to 8/10 on  Faces pain scale Pain location: pt gestured to R leg, R side of torso, and R arm Pain description: numbness, tingling Aggravating factors: sitting or walking for long periods of time Relieving factors: lying down  FALLS: Has patient fallen in last 6 months? No  LIVING ENVIRONMENT: Lives with: lives with their  family Lives in: House/apartment Stairs: Yes: External: 6 steps;  Has following equipment at home: Walker - 2 wheeled  PLOF: Independent with basic ADLs, fishing/hunting, Occupation: Location manager, driving  PATIENT GOALS: decrease dizziness and get strength back to R side  OBJECTIVE:  Note: Objective measures were completed at Evaluation unless otherwise noted.  HAND DOMINANCE: Right  ADLs: Overall ADLs: modI Transfers/ambulation related to ADLs: Eating: ind, may lose balance when seated and therefore pt's wife assists Grooming: ind toothbrushing, not yet attempted shaving UB Dressing: ind LB Dressing: ind Toileting: ind Bathing: pt's brother unsure, pt's spouse and mother is with pt during the day/night Tub Shower transfers: pt's brother unsure   IADLs: dependent Meal prep - pt's family concerned about safety d/t pt on blood thinner medication and therefore pt does not cut with knife for meal prep  Handwriting - 100% legible to sign name and to write simple phrase. OT noted decreased quality of handwriting when pt wrote simple phrase after writing name though legibility was not impacted. Pt's brother reported pt's hand sometimes demo's shaking when trying to sign name or writing for longer periods of time.   MOBILITY STATUS:  2WW recommended by hospital staff and during PT eval today though pt does not use 2WW at home per pt's brother's report.  POSTURE COMMENTS:  Sitting ind until pt needed to lie down on mat. Pt's brother reported pt sometimes loses strength in R leg and R leg becomes numb/tingling and then pt may lose balance with sitting.  ACTIVITY TOLERANCE: Activity tolerance: dizziness spells and pt then has to lay  down. Pt able to tolerate upright sitting for approx. 15-20 minutes then needed to lie down on mat during session. Pt's brother reported pt can tolerate sitting and walking for a few minutes and then needs to lay down at home as well.  FUNCTIONAL OUTCOME  MEASURES: PSFS:  6.3   Total score = sum of the activity scores/number of activities Minimum detectable change (90%CI) for average score = 2 points Minimum detectable change (90%CI) for single activity score = 3 points   UPPER EXTREMITY ROM:    Active ROM Right eval Left eval  Shoulder flexion [facility]  Shoulder abduction    Shoulder adduction    Shoulder extension    Shoulder internal rotation    Shoulder external rotation    Elbow flexion    Elbow extension    Wrist flexion    Wrist extension    Wrist ulnar deviation    Wrist radial deviation    Wrist pronation    Wrist supination    (Blank rows = not tested)  UPPER EXTREMITY MMT:     MMT Right eval Left eval  Shoulder flexion 4 4  Shoulder abduction    Shoulder adduction    Shoulder extension    Shoulder internal rotation    Shoulder external rotation    Middle trapezius    Lower trapezius    Elbow flexion    Elbow extension    Wrist flexion    Wrist extension    Wrist ulnar deviation    Wrist radial deviation    Wrist pronation    Wrist  supination    (Blank rows = not tested)  HAND FUNCTION: Grip strength: Right: 57.5, 54.4, 56.4 (56.1 lbs average) lbs; Left: 78.9, 64.3, 55.3 (66.2 lbs average) lbs  Downward trend of grip strength noted during subsequent trials with LUE, indicating potential fatigue.  COORDINATION: 9 Hole Peg test: Right: 32 sec; Left: 28 sec  SENSATION: Impaired.   Numbness and tingling on R side, including RUE and RLE.  Pt reported pounding sensation around circumference of head when sleeping.  EDEMA: none noted  MUSCLE TONE: RUE: Within functional limits and LUE: Within functional limits  COGNITION: Overall cognitive status: Difficult to fully assess. Pt wrote name and followed at least 1-step and 2-step instructions.  Pt seemed to answer questions appropriately. Pt's brother did not consistently interpret all information though pt remained engaged in therapy session for  duration.  VISION: Not tested. To be further assessed during functional tasks PRN.  VISION ASSESSMENT: Not tested  PERCEPTION: Not tested  PRAXIS: Not tested  OBSERVATIONS: Pt was pleasant and ambulated with 2WW provided by clinic during PT eval, which was completed prior to OT eval. Pt demo'd fair gait using 2WW though sometimes appeared unsteady. Pt's brother did not consistently interpret all information though pt remained engaged in therapy session for duration. Pt tolerated sitting for approx. 15-20 minutes and then had to lay down for several minutes secondary to dizziness.                                                                                                                             TREATMENT :   OT initiated RUE and LUE red theraputty exercises (search, grip, and pinch) as noted in patient instructions for coordination and strength  OT initiated RUE coordination HOME EXERCISE PROGRAM as noted in patient instructions including pick up and placement of items, shuffling and turning cards, item stacking and unstacking, rolling a piece of tissue paper into a ball, rolling golf balls in hand, rolling a pen in between fingers and thumb, picking up and storing items in hand, and then translating stored items to tips of thumb and index finger before placing them individually into a container. Patient able to return demonstration of all exercises. Handout provided.Pt requiring cues to slow pace and breathe to lessen tremor.   PATIENT EDUCATION: Education details: see today's tx above Person educated: Patient and family members Education method: Explanation, Demonstration, Verbal cues, and Handouts Education comprehension: verbalized understanding, returned demonstration, verbal cues required, and needs further education  HOME EXERCISE PROGRAM: 02/26/2024: red putty (BUE) and RUE coordination  GOALS: Goals reviewed with patient? Yes  SHORT TERM GOALS: Target date:  03/05/24  With visual handouts, pt will return demo of BUE HEP. Baseline: new to outpt OT Goal status: MET  2.  Pt will demo improved dominant R hand endurance as evidenced by writing at least 2 sentences with no change in quality of handwriting.  Baseline: Handwriting - 100% legible to sign name and  to write simple phrase. OT noted decreased quality of handwriting when pt wrote simple phrase after writing name though legibility was not impacted. Pt's brother reported pt's hand sometimes demo's shaking when trying to sign name or writing for longer periods of time. Goal status: INITIAL  LONG TERM GOALS: Target date: 03/26/24  Pt will demo improved activity tolerance as evidenced by tolerating upright sitting position for duration of an entire OT session (approx. 40 minutes) Baseline: Pt tolerated sitting for approx. 15-20 minute and then had to lie down on mat for several minutes d/t dizziness. Goal status: INITIAL  2.  Patient will demo improved FM coordination as evidenced by completing nine-hole peg with use of dominant RUE in 28 seconds or less.  Baseline: 9 Hole Peg test: Right: 32 sec; Left: 28 sec Goal status: INITIAL  3.  Patient will demonstrate improved LUE endurance by maintaining at least 64 lbs LUE grip strength across all 3 trials as needed to open jars and other containers.  Baseline: Grip strength: Left: 78.9, 64.3, 55.3 (66.2 lbs average) lbs. Downward trend of grip strength noted during subsequent trials with LUE, indicating potential fatigue. Goal status: INITIAL  4.  Patient will demonstrate at least 60 lbs RUE grip strength, average of 3 trials, as needed to open jars and other containers.  Baseline: Grip strength: Right: 57.5, 54.4, 56.4 (56.1 lbs average) lbs Goal status: INITIAL  ASSESSMENT:  CLINICAL IMPRESSION: Patient demonstrating improved sitting tolerance sitting up for over 3/4 of his session. Good participation despite communication barrier. 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 RUE tremor is unsettling to him, but improved with cues to breathe and slow down.   PERFORMANCE DEFICITS: in functional skills including ADLs, IADLs, coordination, dexterity, proprioception, sensation, strength, flexibility, Fine motor control, Gross motor control, mobility, balance, body mechanics, endurance, and UE functional use, cognitive skills including attention and energy/drive, and psychosocial skills including environmental adaptation.   IMPAIRMENTS: are limiting patient from ADLs, IADLs, rest and sleep, work, leisure, and social participation.   CO-MORBIDITIES: may have co-morbidities  that affects occupational performance. Patient will benefit from skilled OT to address above impairments and improve overall function.  REHAB POTENTIAL: Good  PLAN:  OT FREQUENCY: 1-2x/week  OT DURATION: 4 weeks  PLANNED INTERVENTIONS: 97168 OT Re-evaluation, 97535 self care/ADL training, 95284 therapeutic exercise, 97530 therapeutic activity, 97112 neuromuscular re-education, 97140 manual therapy, 97035 ultrasound, 97018 paraffin, 13244 fluidotherapy, 97760 Orthotic Initial, [id number] Prosthetic Initial, [id number] Orthotic/Prosthetic subsequent, passive range of motion, functional mobility training, visual/perceptual remediation/compensation, energy conservation, patient/family education, and DME and/or AE instructions  RECOMMENDED OTHER SERVICES: PT eval already completed.  CONSULTED AND AGREED WITH PLAN OF CARE: Patient and family member/caregiver  PLAN FOR NEXT SESSION:  Recommended to complete session at mat table in case pt needs to lie down secondary to dizziness. Did pt get cards? If so, trial golf solitaire?  Review B Theraputty HEP Review R FM coordination HEP - retest L side with 9 hole, reports recent decline Work on sitting tolerance Handwriting endurance/practice   Altamease Asters, OT 02/26/2024, 5:41  PM

## 2024-02-26 NOTE — Patient Instructions (Addendum)
 Marland Kitchen

## 2024-03-01 ENCOUNTER — Other Ambulatory Visit (HOSPITAL_COMMUNITY): Payer: Self-pay

## 2024-03-02 ENCOUNTER — Observation Stay (HOSPITAL_COMMUNITY)

## 2024-03-02 ENCOUNTER — Ambulatory Visit: Admitting: Physical Therapy

## 2024-03-02 ENCOUNTER — Encounter (HOSPITAL_COMMUNITY): Payer: Self-pay

## 2024-03-02 ENCOUNTER — Emergency Department (HOSPITAL_COMMUNITY)

## 2024-03-02 ENCOUNTER — Encounter: Admitting: Occupational Therapy

## 2024-03-02 ENCOUNTER — Inpatient Hospital Stay (HOSPITAL_COMMUNITY)
Admission: EM | Admit: 2024-03-02 | Discharge: 2024-03-05 | DRG: 092 | Disposition: A | Discharge status: Home / Self Care | Attending: Internal Medicine | Admitting: Internal Medicine

## 2024-03-02 ENCOUNTER — Ambulatory Visit (INDEPENDENT_AMBULATORY_CARE_PROVIDER_SITE_OTHER): Admitting: Adult Health

## 2024-03-02 VITALS — BP 110/70 | HR 72 | Temp 98.6°F

## 2024-03-02 DIAGNOSIS — Z7141 Alcohol abuse counseling and surveillance of alcoholic: Secondary | ICD-10-CM

## 2024-03-02 DIAGNOSIS — E860 Dehydration: Secondary | ICD-10-CM | POA: Diagnosis not present

## 2024-03-02 DIAGNOSIS — F1090 Alcohol use, unspecified, uncomplicated: Secondary | ICD-10-CM | POA: Diagnosis present

## 2024-03-02 DIAGNOSIS — K219 Gastro-esophageal reflux disease without esophagitis: Secondary | ICD-10-CM | POA: Diagnosis present

## 2024-03-02 DIAGNOSIS — Y900 Blood alcohol level of less than 20 mg/100 ml: Secondary | ICD-10-CM | POA: Diagnosis present

## 2024-03-02 DIAGNOSIS — R5381 Other malaise: Secondary | ICD-10-CM | POA: Diagnosis present

## 2024-03-02 DIAGNOSIS — Z79899 Other long term (current) drug therapy: Secondary | ICD-10-CM

## 2024-03-02 DIAGNOSIS — Z758 Other problems related to medical facilities and other health care: Secondary | ICD-10-CM

## 2024-03-02 DIAGNOSIS — Z7901 Long term (current) use of anticoagulants: Secondary | ICD-10-CM

## 2024-03-02 DIAGNOSIS — E785 Hyperlipidemia, unspecified: Secondary | ICD-10-CM | POA: Diagnosis present

## 2024-03-02 DIAGNOSIS — H913 Deaf nonspeaking, not elsewhere classified: Secondary | ICD-10-CM | POA: Diagnosis present

## 2024-03-02 DIAGNOSIS — E876 Hypokalemia: Secondary | ICD-10-CM | POA: Diagnosis not present

## 2024-03-02 DIAGNOSIS — G8191 Hemiplegia, unspecified affecting right dominant side: Secondary | ICD-10-CM | POA: Diagnosis present

## 2024-03-02 DIAGNOSIS — Z8661 Personal history of infections of the central nervous system: Secondary | ICD-10-CM

## 2024-03-02 DIAGNOSIS — I8290 Acute embolism and thrombosis of unspecified vein: Secondary | ICD-10-CM

## 2024-03-02 DIAGNOSIS — R531 Weakness: Secondary | ICD-10-CM

## 2024-03-02 DIAGNOSIS — G510 Bell's palsy: Secondary | ICD-10-CM | POA: Diagnosis present

## 2024-03-02 DIAGNOSIS — G08 Intracranial and intraspinal phlebitis and thrombophlebitis: Secondary | ICD-10-CM

## 2024-03-02 DIAGNOSIS — E0781 Sick-euthyroid syndrome: Secondary | ICD-10-CM | POA: Diagnosis present

## 2024-03-02 DIAGNOSIS — I69391 Dysphagia following cerebral infarction: Secondary | ICD-10-CM

## 2024-03-02 DIAGNOSIS — Z603 Acculturation difficulty: Secondary | ICD-10-CM

## 2024-03-02 LAB — PROTIME-INR
INR: 1.4 — ABNORMAL HIGH (ref 0.8–1.2)
Prothrombin Time: 16.9 s — ABNORMAL HIGH (ref 11.4–15.2)

## 2024-03-02 LAB — CBC
HCT: 41.3 % (ref 39.0–52.0)
Hemoglobin: 13.2 g/dL (ref 13.0–17.0)
MCH: 27 pg (ref 26.0–34.0)
MCHC: 32 g/dL (ref 30.0–36.0)
MCV: 84.6 fL (ref 80.0–100.0)
Platelets: 229 10*3/uL (ref 150–400)
RBC: 4.88 MIL/uL (ref 4.22–5.81)
RDW: 12 % (ref 11.5–15.5)
WBC: 6.7 10*3/uL (ref 4.0–10.5)
nRBC: 0 % (ref 0.0–0.2)

## 2024-03-02 LAB — DIFFERENTIAL
Abs Immature Granulocytes: 0.02 10*3/uL (ref 0.00–0.07)
Basophils Absolute: 0.1 10*3/uL (ref 0.0–0.1)
Basophils Relative: 1 %
Eosinophils Absolute: 0.3 10*3/uL (ref 0.0–0.5)
Eosinophils Relative: 5 %
Immature Granulocytes: 0 %
Lymphocytes Relative: 26 %
Lymphs Abs: 1.7 10*3/uL (ref 0.7–4.0)
Monocytes Absolute: 0.5 10*3/uL (ref 0.1–1.0)
Monocytes Relative: 7 %
Neutro Abs: 4.1 10*3/uL (ref 1.7–7.7)
Neutrophils Relative %: 61 %

## 2024-03-02 LAB — I-STAT CHEM 8, ED
BUN: 6 mg/dL (ref 6–20)
Calcium, Ion: 1.1 mmol/L — ABNORMAL LOW (ref 1.15–1.40)
Chloride: 109 mmol/L (ref 98–111)
Creatinine, Ser: 1 mg/dL (ref 0.61–1.24)
Glucose, Bld: 104 mg/dL — ABNORMAL HIGH (ref 70–99)
HCT: 40 % (ref 39.0–52.0)
Hemoglobin: 13.6 g/dL (ref 13.0–17.0)
Potassium: 3.4 mmol/L — ABNORMAL LOW (ref 3.5–5.1)
Sodium: 143 mmol/L (ref 135–145)
TCO2: 23 mmol/L (ref 22–32)

## 2024-03-02 LAB — COMPREHENSIVE METABOLIC PANEL WITH GFR
ALT: 54 U/L — ABNORMAL HIGH (ref 0–44)
AST: 30 U/L (ref 15–41)
Albumin: 4 g/dL (ref 3.5–5.0)
Alkaline Phosphatase: 51 U/L (ref 38–126)
Anion gap: 12 (ref 5–15)
BUN: 5 mg/dL — ABNORMAL LOW (ref 6–20)
CO2: 21 mmol/L — ABNORMAL LOW (ref 22–32)
Calcium: 9.1 mg/dL (ref 8.9–10.3)
Chloride: 110 mmol/L (ref 98–111)
Creatinine, Ser: 1.05 mg/dL (ref 0.61–1.24)
GFR, Estimated: >60 mL/min (ref >60)
Glucose, Bld: 104 mg/dL — ABNORMAL HIGH (ref 70–99)
Potassium: 3.4 mmol/L — ABNORMAL LOW (ref 3.5–5.1)
Sodium: 143 mmol/L (ref 135–145)
Total Bilirubin: 0.7 mg/dL (ref 0.0–1.2)
Total Protein: 6.9 g/dL (ref 6.5–8.1)

## 2024-03-02 LAB — ETHANOL: Alcohol, Ethyl (B): <15 mg/dL (ref <15)

## 2024-03-02 LAB — CBG MONITORING, ED: Glucose-Capillary: 114 mg/dL — ABNORMAL HIGH (ref 70–99)

## 2024-03-02 LAB — APTT: aPTT: 35 s (ref 24–36)

## 2024-03-02 MED ORDER — IOHEXOL 350 MG/ML SOLN
75.0000 mL | Freq: Once | INTRAVENOUS | Status: AC | PRN
Start: 2024-03-02 — End: 2024-03-02
  Administered 2024-03-02: 75 mL via INTRAVENOUS

## 2024-03-02 MED ORDER — SODIUM CHLORIDE 0.9 % IV SOLN: INTRAVENOUS | Status: DC

## 2024-03-02 MED ORDER — ATORVASTATIN CALCIUM 80 MG PO TABS
80.0000 mg | ORAL_TABLET | Freq: Every evening | ORAL | Status: DC
  Administered 2024-03-02 – 2024-03-04 (×3): 80 mg via ORAL
  Filled 2024-03-02 (×2): qty 1
  Filled 2024-03-02: qty 2

## 2024-03-02 MED ORDER — STROKE: EARLY STAGES OF RECOVERY BOOK: Freq: Once | Status: DC

## 2024-03-02 MED ORDER — TOPIRAMATE 25 MG PO TABS
25.0000 mg | ORAL_TABLET | Freq: Every day | ORAL | Status: DC
Start: 2024-03-02 — End: 2024-03-05
  Administered 2024-03-02 – 2024-03-05 (×4): 25 mg via ORAL
  Filled 2024-03-02 (×4): qty 1

## 2024-03-02 MED ORDER — APIXABAN 5 MG PO TABS
5.0000 mg | ORAL_TABLET | Freq: Two times a day (BID) | ORAL | Status: DC
  Administered 2024-03-02 – 2024-03-05 (×6): 5 mg via ORAL
  Filled 2024-03-02: qty 1
  Filled 2024-03-02: qty 2
  Filled 2024-03-02 (×4): qty 1

## 2024-03-02 MED ORDER — SODIUM CHLORIDE 0.9 % IV BOLUS
1000.0000 mL | Freq: Once | INTRAVENOUS | Status: AC
  Administered 2024-03-02: 1000 mL via INTRAVENOUS

## 2024-03-02 MED ORDER — PANTOPRAZOLE SODIUM 40 MG PO TBEC
40.0000 mg | DELAYED_RELEASE_TABLET | Freq: Every day | ORAL | Status: DC
  Administered 2024-03-02 – 2024-03-05 (×4): 40 mg via ORAL
  Filled 2024-03-02 (×4): qty 1

## 2024-03-02 MED ORDER — GADOBUTROL 1 MMOL/ML IV SOLN
6.0000 mL | Freq: Once | INTRAVENOUS | Status: AC | PRN
  Administered 2024-03-02: 6 mL via INTRAVENOUS

## 2024-03-02 MED ORDER — ONDANSETRON HCL 4 MG/2ML IJ SOLN
4.0000 mg | Freq: Four times a day (QID) | INTRAMUSCULAR | Status: DC | PRN
  Administered 2024-03-03 – 2024-03-04 (×2): 4 mg via INTRAVENOUS
  Filled 2024-03-02 (×2): qty 2

## 2024-03-02 MED ORDER — SODIUM CHLORIDE 0.9 % IV SOLN: INTRAVENOUS | Status: AC

## 2024-03-02 MED ORDER — ACETAMINOPHEN 160 MG/5ML PO SOLN: 650.0000 mg | ORAL | Status: DC | PRN

## 2024-03-02 MED ORDER — ACETAMINOPHEN 650 MG RE SUPP: 650.0000 mg | RECTAL | Status: DC | PRN

## 2024-03-02 MED ORDER — BUTALBITAL-APAP-CAFFEINE 50-325-40 MG PO TABS
1.0000 | ORAL_TABLET | Freq: Four times a day (QID) | ORAL | Status: DC | PRN
  Administered 2024-03-02 – 2024-03-05 (×6): 1 via ORAL
  Filled 2024-03-02 (×5): qty 1

## 2024-03-02 MED ORDER — ACETAMINOPHEN 325 MG PO TABS
650.0000 mg | ORAL_TABLET | ORAL | Status: DC | PRN
  Administered 2024-03-02: 650 mg via ORAL
  Filled 2024-03-02: qty 2

## 2024-03-02 MED ORDER — POTASSIUM CHLORIDE CRYS ER 20 MEQ PO TBCR
40.0000 meq | EXTENDED_RELEASE_TABLET | Freq: Once | ORAL | Status: AC
  Administered 2024-03-02: 40 meq via ORAL
  Filled 2024-03-02: qty 2

## 2024-03-02 NOTE — ED Triage Notes (Addendum)
 PT was bib [facility] from a physician office where he was there to establish care. PT was asked to sign his name with hius right hand and was unable to. It has been previously noted that PT has right sided deficits when sitting down but they correct themselves when PT lies flat.PT is post meningitis, is on eliquis  and is deaf and speaks a diff language.EMS vs were: cbg:113, BP:113/76 HR:66 O2:98%RA . LKW  was at  1445 when he arrived to the physician office. PT has venus sinus thrombosis

## 2024-03-02 NOTE — ED Notes (Addendum)
 After hours [name] interpreter can be reached at the following numbers:  Pager: [phone]  Cell: [phone]

## 2024-03-02 NOTE — ED Notes (Signed)
 PT passed swallow screen and is able to obtain a diet order

## 2024-03-02 NOTE — Progress Notes (Deleted)
 Patient presents to clinic today to establish care.  Acute Concerns:   Chronic Issues: Deafness    Health Maintenance: Dental -- Vision -- Immunizations -- Colonoscopy -- -    Past Medical History:  Diagnosis Date   Deaf     Past Surgical History:  Procedure Laterality Date   IR ANGIO INTRA EXTRACRAN SEL COM CAROTID INNOMINATE BILAT MOD SED  02/02/2024   IR THROMBECT VENO MECH MOD SED  02/02/2024   IR VENO/JUGULAR LEFT  02/02/2024   RADIOLOGY WITH ANESTHESIA N/A 02/02/2024   Procedure: RADIOLOGY WITH ANESTHESIA;  Surgeon: Luellen Sages, MD;  Location: [facility] OR;  Service: Radiology;  Laterality: N/A;    Current Outpatient Medications on File Prior to Visit  Medication Sig Dispense Refill   apixaban  (ELIQUIS ) 5 MG TABS tablet Take 1 tablet (5 mg total) by mouth 2 (two) times daily. 60 tablet 0   atorvastatin  (LIPITOR ) 80 MG tablet Take 1 tablet (80 mg total) by mouth daily. 30 tablet 0   ondansetron  (ZOFRAN ) 4 MG tablet Take 1 tablet (4 mg total) by mouth every 6 (six) hours as needed for nausea. 20 tablet 0   No current facility-administered medications on file prior to visit.    No Known Allergies  No family history on file.  Social History   Socioeconomic History   Marital status: Single    Spouse name: Not on file   Number of children: Not on file   Years of education: Not on file   Highest education level: Not on file  Occupational History   Not on file  Tobacco Use   Smoking status: Never   Smokeless tobacco: Never  Substance and Sexual Activity   Alcohol use: Not Currently   Drug use: Not Currently   Sexual activity: Not on file  Other Topics Concern   Not on file  Social History Narrative   ** Merged History Encounter **       Social Drivers of Health   Financial Resource Strain: Not on file  Food Insecurity: No Food Insecurity (02/02/2024)   Hunger Vital Sign    Worried About Running Out of Food in the Last Year: Never true    Ran  Out of Food in the Last Year: Never true  Transportation Needs: No Transportation Needs (02/02/2024)   PRAPARE - Administrator, Civil Service (Medical): No    Lack of Transportation (Non-Medical): No  Physical Activity: Not on file  Stress: Not on file  Social Connections: Patient Declined (02/08/2024)   Social Connection and Isolation Panel [NHANES]    Frequency of Communication with Friends and Family: Patient declined    Frequency of Social Gatherings with Friends and Family: Patient declined    Attends Religious Services: Patient declined    Database administrator or Organizations: Patient declined    Attends Banker Meetings: Patient declined    Marital Status: Patient declined  Intimate Partner Violence: Patient Unable To Answer (02/02/2024)   Humiliation, Afraid, Rape, and Kick questionnaire    Fear of Current or Ex-Partner: Patient unable to answer    Emotionally Abused: Patient unable to answer    Physically Abused: Patient unable to answer    Sexually Abused: Patient unable to answer    ROS  There were no vitals taken for this visit.  Physical Exam  Recent Results (from the past 2160 hours)  Resp panel by RT-PCR (RSV, Flu A&B, Covid) Anterior Nasal Swab  Status: None   Collection Time: 01/25/24 11:53 AM   Specimen: Anterior Nasal Swab  Result Value Ref Range   SARS Coronavirus 2 by RT PCR NEGATIVE NEGATIVE   Influenza A by PCR NEGATIVE NEGATIVE   Influenza B by PCR NEGATIVE NEGATIVE    Comment: (NOTE) The Xpert Xpress SARS-CoV-2/FLU/RSV plus assay is intended as an aid in the diagnosis of influenza from Nasopharyngeal swab specimens and should not be used as a sole basis for treatment. Nasal washings and aspirates are unacceptable for Xpert Xpress SARS-CoV-2/FLU/RSV testing.  Fact Sheet for Patients: BloggerCourse.com  Fact Sheet for Healthcare Providers: SeriousBroker.it  This test  is not yet approved or cleared by the [place]  FDA and has been authorized for detection and/or diagnosis of SARS-CoV-2 by FDA under an Emergency Use Authorization (EUA). This EUA will remain in effect (meaning this test can be used) for the duration of the COVID-19 declaration under Section 564(b)(1) of the Act, 21 U.S.C. section 360bbb-3(b)(1), unless the authorization is terminated or revoked.     Resp Syncytial Virus by PCR NEGATIVE NEGATIVE    Comment: (NOTE) Fact Sheet for Patients: BloggerCourse.com  Fact Sheet for Healthcare Providers: SeriousBroker.it  This test is not yet approved or cleared by the [place]  FDA and has been authorized for detection and/or diagnosis of SARS-CoV-2 by FDA under an Emergency Use Authorization (EUA). This EUA will remain in effect (meaning this test can be used) for the duration of the COVID-19 declaration under Section 564(b)(1) of the Act, 21 U.S.C. section 360bbb-3(b)(1), unless the authorization is terminated or revoked.  Performed at [facility], [address] [place], [place] [address]   CBC with Differential     Status: Abnormal   Collection Time: 01/25/24 12:18 PM  Result Value Ref Range   WBC 11.8 (H) 4.0 - 10.5 K/uL   RBC 5.54 4.22 - 5.81 MIL/uL   Hemoglobin 14.7 13.0 - 17.0 g/dL   HCT 13.0 86.5 - 78.4 %   MCV 84.1 80.0 - 100.0 fL   MCH 26.5 26.0 - 34.0 pg   MCHC 31.5 30.0 - 36.0 g/dL   RDW 69.6 29.5 - 28.4 %   Platelets 194 150 - 400 K/uL   nRBC 0.0 0.0 - 0.2 %   Neutrophils Relative % 84 %   Neutro Abs 9.8 (H) 1.7 - 7.7 K/uL   Lymphocytes Relative 7 %   Lymphs Abs 0.9 0.7 - 4.0 K/uL   Monocytes Relative 9 %   Monocytes Absolute 1.0 0.1 - 1.0 K/uL   Eosinophils Relative 0 %   Eosinophils Absolute 0.0 0.0 - 0.5 K/uL   Basophils Relative 0 %   Basophils Absolute 0.0 0.0 - 0.1 K/uL   Immature Granulocytes 0 %   Abs Immature Granulocytes 0.05 0.00 -  0.07 K/uL    Comment: Performed at [facility], [address], [place], [place] [address]  Basic metabolic panel     Status: Abnormal   Collection Time: 01/25/24 12:18 PM  Result Value Ref Range   Sodium 137 135 - 145 mmol/L   Potassium 3.8 3.5 - 5.1 mmol/L   Chloride 101 98 - 111 mmol/L   CO2 21 (L) 22 - 32 mmol/L   Glucose, Bld 148 (H) 70 - 99 mg/dL    Comment: Glucose reference range applies only to samples taken after fasting for at least 8 hours.   BUN 18 6 - 20 mg/dL   Creatinine, Ser 0.10 0.61 - 1.24  mg/dL   Calcium  10.1 8.9 - 10.3 mg/dL   GFR, Estimated >16 >10 mL/min    Comment: (NOTE) Calculated using the CKD-EPI Creatinine Equation (2021)    Anion gap 15 5 - 15    Comment: Performed at [facility], [address] [place], [place] [address]  CSF cell count with differential collection tube #: 1     Status: Abnormal   Collection Time: 01/25/24  2:46 PM  Result Value Ref Range   Tube # 1    Color, CSF PINK (A) COLORLESS   Appearance, CSF CLEAR (A) CLEAR   Supernatant COLORLESS    RBC Count, CSF 1,330 (H) 0 /cu mm   WBC, CSF 22 (HH) 0 - 5 /cu mm    Comment: CRITICAL RESULT CALLED TO, READ BACK BY AND VERIFIED WITH: Called to Demetra Filter, RN @1705  01/25/2024 by Fausto Hooker Tube number verified    Segmented Neutrophils-CSF 50 (H) 0 - 6 %   Lymphs, CSF 16 (L) 40 - 80 %   Monocyte-Macrophage-Spinal Fluid 34 15 - 45 %   Eosinophils, CSF 0 0 - 1 %    Comment: Performed at [facility], [address] [place], [place] [address]  CSF culture w Gram Stain     Status: None   Collection Time: 01/25/24  2:46 PM   Specimen: CSF; Cerebrospinal Fluid  Result Value Ref Range   Specimen Description CSF    Special Requests NONE    Gram Stain      WBC PRESENT,BOTH PMN AND MONONUCLEAR NO ORGANISMS SEEN CYTOSPIN SMEAR    Culture      NO GROWTH 3 DAYS Performed at [facility], [address], [place], [place] [address]    Report Status 01/29/2024 FINAL    Protein and glucose, CSF     Status: Abnormal   Collection Time: 01/25/24  2:46 PM  Result Value Ref Range   Glucose, CSF 80 (H) 40 - 70 mg/dL   Total  Protein, CSF 91 (H) 15 - 45 mg/dL    Comment: Performed at [facility], [address] [place], [place] [address]  VDRL, CSF     Status: None   Collection Time: 01/25/24  2:46 PM  Result Value Ref Range   VDRL Quant, CSF Non Reactive Non Rea:<1:1    Comment: (NOTE) Performed At: [facility] [address] [place], [place] [id number] Pearlean Botts MD IO:[id number]   CSF cell count with differential     Status: Abnormal   Collection Time: 01/25/24  2:47 PM  Result Value Ref Range   Tube # 4    Color, CSF PINK (A) COLORLESS   Appearance, CSF CLEAR (A) CLEAR   Supernatant COLORLESS    RBC Count, CSF 1,350 (H) 0 /cu mm   WBC, CSF 22 (HH) 0 - 5 /cu mm    Comment: CRITICAL RESULT CALLED TO, READ BACK BY AND VERIFIED WITH: Called to Demetra Filter, RN @1705  01/25/2024 by Fausto Hooker Verified Tube number    Segmented Neutrophils-CSF 58 (H) 0 - 6 %   Lymphs, CSF 18 (L) 40 - 80 %   Monocyte-Macrophage-Spinal Fluid 24 15 - 45 %   Eosinophils, CSF 0 0 - 1 %    Comment: Performed at [facility], [address] [place], [place] [address]  Culture, blood (Routine X 2)     Status: None   Collection Time: 01/25/24  2:47 PM   Specimen: BLOOD  LEFT WRIST  Result Value Ref Range   Specimen Description BLOOD LEFT WRIST    Special Requests      BOTTLES DRAWN AEROBIC AND ANAEROBIC Blood Culture results may not be optimal due to an inadequate volume of blood received in culture bottles   Culture      NO GROWTH 5 DAYS Performed at [facility], [address], [place], [place] [address]    Report Status 01/30/2024 FINAL   Pathologist smear review     Status: None   Collection Time: 01/25/24  2:47 PM  Result Value Ref Range   Path Review      Negative for malignancy.  Compatible with peripheral blood.    Comment:  Reviewed by Arlette Benders. Picklesimer, M.D. 01/26/2024 Performed at [facility], [address], [place], [place] [address]   Culture, blood (Routine X 2)     Status: None   Collection Time: 01/25/24  2:52 PM   Specimen: BLOOD  Result Value Ref Range   Specimen Description BLOOD RIGHT ANTECUBITAL    Special Requests      BOTTLES DRAWN AEROBIC AND ANAEROBIC Blood Culture adequate volume   Culture      NO GROWTH 5 DAYS Performed at [facility], [address] [place], [place] [address]    Report Status 01/30/2024 FINAL   Meningitis/Encephalitis Panel (CSF)     Status: None   Collection Time: 01/25/24  3:00 PM  Result Value Ref Range   Cryptococcus neoformans/gattii (CSF) NOT DETECTED NOT DETECTED    Comment: (NOTE) Patients with a suspicion of cryptococcal meningitis should be tested  for cryptococcal antigen (CrAg).      Cytomegalovirus (CSF) NOT DETECTED NOT DETECTED   Enterovirus (CSF) NOT DETECTED NOT DETECTED   Escherichia coli K1 (CSF) NOT DETECTED NOT DETECTED    Comment: (NOTE) Only E. coli strains possessing the K1 capsular antigen will be detected.      Haemophilus influenzae (CSF) NOT DETECTED NOT DETECTED   Herpes simplex virus 1 (CSF) NOT DETECTED NOT DETECTED   Herpes simplex virus 2 (CSF) NOT DETECTED NOT DETECTED   Human herpesvirus 6 (CSF) NOT DETECTED NOT DETECTED   Human parechovirus (CSF) NOT DETECTED NOT DETECTED   Listeria monocytogenes (CSF) NOT DETECTED NOT DETECTED   Neisseria meningitis (CSF) NOT DETECTED NOT DETECTED    Comment: (NOTE) Only encapsulated strains of N. meningitidis will be detected.     Streptococcus agalactiae (CSF) NOT DETECTED NOT DETECTED   Streptococcus pneumoniae (CSF) NOT DETECTED NOT DETECTED   Varicella zoster virus (CSF) NOT DETECTED NOT DETECTED    Comment: Performed at [facility], [address] [place], [place] [address]  Lyme Disease Serology w/Reflex     Status: None   Collection Time: 01/25/24   4:00 PM  Result Value Ref Range   Lyme Total Antibody EIA Negative Negative    Comment: (NOTE) Lyme antibodies not detected. Reflex testing is not indicated. No laboratory evidence of infection with B. burgdorferi (Lyme disease). Negative results may occur in patients recently infected (less than or equal to 14 days) with B. burgdorferi.  If recent infection is suspected, repeat testing on a new sample collected in 7 to 14 days is recommended. Performed At: [facility] [address] [place], [place] [id number] Pearlean Botts MD [id number]   HIV Antibody (routine testing w rflx)     Status: None   Collection Time: 01/26/24  3:49 AM  Result Value Ref  Range   HIV Screen 4th Generation wRfx Non Reactive Non Reactive    Comment: Performed at [facility], [address] [place], [place] [address]  CBC     Status: None   Collection Time: 01/26/24  3:49 AM  Result Value Ref Range   WBC 6.8 4.0 - 10.5 K/uL   RBC 5.26 4.22 - 5.81 MIL/uL   Hemoglobin 14.0 13.0 - 17.0 g/dL   HCT 19.1 47.8 - 29.5 %   MCV 82.9 80.0 - 100.0 fL   MCH 26.6 26.0 - 34.0 pg   MCHC 32.1 30.0 - 36.0 g/dL   RDW 62.1 30.8 - 65.7 %   Platelets 197 150 - 400 K/uL   nRBC 0.0 0.0 - 0.2 %    Comment: Performed at [facility], [address] [place], [place] [address]  Comprehensive metabolic panel     Status: Abnormal   Collection Time: 01/26/24  3:49 AM  Result Value Ref Range   Sodium 137 135 - 145 mmol/L   Potassium 4.0 3.5 - 5.1 mmol/L   Chloride 107 98 - 111 mmol/L   CO2 18 (L) 22 - 32 mmol/L   Glucose, Bld 141 (H) 70 - 99 mg/dL    Comment: Glucose reference range applies only to samples taken after fasting for at least 8 hours.   BUN 10 6 - 20 mg/dL   Creatinine, Ser 2.95 0.61 - 1.24 mg/dL   Calcium  9.4 8.9 - 10.3 mg/dL   Total Protein 7.8 6.5 - 8.1 g/dL   Albumin 3.8 3.5 - 5.0 g/dL   AST 51 (H) 15 - 41 U/L   ALT 120 (H) 0 - 44 U/L   Alkaline Phosphatase 51 38 - 126 U/L   Total  Bilirubin 0.9 0.0 - 1.2 mg/dL   GFR, Estimated >28 >41 mL/min    Comment: (NOTE) Calculated using the CKD-EPI Creatinine Equation (2021)    Anion gap 12 5 - 15    Comment: Performed at [facility], [address], [place], [place] [address]  Spotted Fever Group Antibodies     Status: None   Collection Time: 01/26/24 10:48 AM  Result Value Ref Range   Spotted Fever Group IgG <1:64 Neg:<1:64    Comment: (NOTE) This test was developed and its performance characteristics determined by [facility]. It has not been cleared or approved by the Food and Drug Administration.    Spotted Fever Group IgM <1:64 Neg:<1:64    Comment: (NOTE) This test was developed and its performance characteristics determined by [facility]. It has not been cleared or approved by the Food and Drug Administration.    Result Comment Comment     Comment: (NOTE) Spotted Fever Group IgG serum endpoint titer of >=1:64 is suggestive of infection at an unknown time and may be a sign of either past infection or early response to a recent infection. Spotted Fever Group IgM titer of >=1:64 is regarded as probable evidence of recent or ongoing infection. A four-fold or greater increase in titer between two serum samples drawn 1-2 weeks apart and tested in parallel is the best serologic indicator of a recent rickettsial infection. Performed At: [facility] [address] [place], [place] [id number] Pearlean Botts MD [id number]:[id number]   Glucose, capillary     Status: Abnormal   Collection Time: 01/26/24  3:24 PM  Result Value Ref Range   Glucose-Capillary 117 (H) 70 - 99 mg/dL    Comment: Glucose reference range applies  only to samples taken after fasting for at least 8 hours.  Hemoglobin A1c     Status: Abnormal   Collection Time: 01/26/24  8:47 PM  Result Value Ref Range   Hgb A1c MFr Bld 5.7 (H) 4.8 - 5.6 %    Comment: (NOTE) Pre diabetes:          5.7%-6.4%  Diabetes:              >6.4%  Glycemic  control for   <7.0% adults with diabetes    Mean Plasma Glucose 116.89 mg/dL    Comment: Performed at [facility], [address] [place], [place] [address]  Lipid panel     Status: Abnormal   Collection Time: 01/27/24  4:22 AM  Result Value Ref Range   Cholesterol 346 (H) 0 - 200 mg/dL   Triglycerides 99 <914 mg/dL   HDL 47 >78 mg/dL   Total CHOL/HDL Ratio 7.4 RATIO   VLDL 20 0 - 40 mg/dL   LDL Cholesterol 295 (H) 0 - 99 mg/dL    Comment:        Total Cholesterol/HDL:CHD Risk Coronary Heart Disease Risk Table                     Men   Women  1/2 Average Risk   3.4   3.3  Average Risk       5.0   4.4  2 X Average Risk   9.6   7.1  3 X Average Risk  23.4   11.0        Use the calculated Patient Ratio above and the CHD Risk Table to determine the patient's CHD Risk.        ATP III CLASSIFICATION (LDL):  <100     mg/dL   Optimal  621-308  mg/dL   Near or Above                    Optimal  130-159  mg/dL   Borderline  657-846  mg/dL   High  >962     mg/dL   Very High Performed at [facility], [address], [place], [place] [address]   Ehrlichia antibody panel     Status: None   Collection Time: 01/27/24  8:59 AM  Result Value Ref Range   E.Chaffeensis (HME) IgG Negative Neg:<1:64   E. Chaffeensis (HME) IgM Titer Negative Neg:<1:20   E chaffeensis ([facility]) Ab, IgG Negative Neg:<1:64   E chaffeensis (HGE) Ab, IgM Negative Neg:<1:20    Comment: (NOTE) Due to a reagent backorder, this test was performed using a different assay. The reference interval for this alternate assay is:                                       Negative      <1:64                                       Positive       1:64 or greater    Result Comment: Comment     Comment: (NOTE) Antibody titers may be negative in the first 7-10 days of illness. A four-fold rise in IgG antibody titers for Anaplasma phagocytophilum and/or Ehrlichia chaffeensis in paired samples (acute and  convalescent)  supports the diagnosis of anaplasmosis and/or ehrlichiosis, respectively. Performed At: [facility] [address] [place], [place] [id number] Pearlean Botts MD GE:[id number]   RPR     Status: None   Collection Time: 01/27/24  8:59 AM  Result Value Ref Range   RPR Ser Ql NON REACTIVE NON REACTIVE    Comment: Performed at [facility], [address], [place], [place] [address]  ECHOCARDIOGRAM COMPLETE     Status: None   Collection Time: 01/27/24  9:29 AM  Result Value Ref Range   Weight 2,546.75 oz   Height 64 in   BP 94/74 mmHg   S' Lateral 3.00 cm   Area-P 1/2 3.83 cm2   Est EF 45 - 50%   Rapid urine drug screen (hospital performed)     Status: None   Collection Time: 01/27/24 11:09 AM  Result Value Ref Range   Opiates NONE DETECTED NONE DETECTED   Cocaine NONE DETECTED NONE DETECTED   Benzodiazepines NONE DETECTED NONE DETECTED   Amphetamines NONE DETECTED NONE DETECTED   Tetrahydrocannabinol NONE DETECTED NONE DETECTED   Barbiturates NONE DETECTED NONE DETECTED    Comment: (NOTE) DRUG SCREEN FOR MEDICAL PURPOSES ONLY.  IF CONFIRMATION IS NEEDED FOR ANY PURPOSE, NOTIFY LAB WITHIN 5 DAYS.  LOWEST DETECTABLE LIMITS FOR URINE DRUG SCREEN Drug Class                     Cutoff (ng/mL) Amphetamine and metabolites    1000 Barbiturate and metabolites    200 Benzodiazepine                 200 Opiates and metabolites        300 Cocaine and metabolites        300 THC                            50 Performed at [facility], [address], [place], [place] [address]   Hepatitis panel, acute     Status: None   Collection Time: 01/27/24  1:55 PM  Result Value Ref Range   Hepatitis B Surface Ag NON REACTIVE NON REACTIVE   HCV Ab NON REACTIVE NON REACTIVE    Comment: (NOTE) Nonreactive HCV antibody screen is consistent with no HCV infections,  unless recent infection is suspected or other evidence exists to indicate HCV infection.     Hep A IgM NON  REACTIVE NON REACTIVE   Hep B C IgM NON REACTIVE NON REACTIVE    Comment: Performed at [facility], [address] [place], [place] [address]  Miscellaneous [facility] test (send-out)     Status: None   Collection Time: 01/27/24  1:55 PM  Result Value Ref Range   [facility] test code [id number]    [facility] test name [id number]     Comment: Performed at [facility], [address] [place], [place] [address]   Misc [facility] result COMMENT     Comment: (NOTE) Performed At: [facility] [address] [place], [place] [id number] Pearlean Botts MD [id number]:[id number]   CBC     Status: Abnormal   Collection Time: 01/28/24  3:10 AM  Result Value Ref Range   WBC 11.7 (H) 4.0 - 10.5 K/uL   RBC 5.25 4.22 - 5.81 MIL/uL   Hemoglobin 13.9 13.0 - 17.0 g/dL   HCT 35.5 73.2 - 20.2 %   MCV 81.7  80.0 - 100.0 fL   MCH 26.5 26.0 - 34.0 pg   MCHC 32.4 30.0 - 36.0 g/dL   RDW 16.1 09.6 - 04.5 %   Platelets 237 150 - 400 K/uL   nRBC 0.0 0.0 - 0.2 %    Comment: Performed at [facility], [address], [place], [place] [address]  Comprehensive metabolic panel     Status: Abnormal   Collection Time: 01/28/24  3:10 AM  Result Value Ref Range   Sodium 137 135 - 145 mmol/L   Potassium 3.7 3.5 - 5.1 mmol/L   Chloride 108 98 - 111 mmol/L   CO2 22 22 - 32 mmol/L   Glucose, Bld 165 (H) 70 - 99 mg/dL    Comment: Glucose reference range applies only to samples taken after fasting for at least 8 hours.   BUN 23 (H) 6 - 20 mg/dL   Creatinine, Ser 1.91 0.61 - 1.24 mg/dL   Calcium  9.2 8.9 - 10.3 mg/dL   Total Protein 7.2 6.5 - 8.1 g/dL   Albumin 3.5 3.5 - 5.0 g/dL   AST 75 (H) 15 - 41 U/L   ALT 203 (H) 0 - 44 U/L   Alkaline Phosphatase 49 38 - 126 U/L   Total Bilirubin 1.0 0.0 - 1.2 mg/dL   GFR, Estimated >47 >82 mL/min    Comment: (NOTE) Calculated using the CKD-EPI Creatinine Equation (2021)    Anion gap 7 5 - 15    Comment: Performed at [facility], [address] [place], [place]  [address]  Respiratory (~20 pathogens) panel by PCR     Status: Abnormal   Collection Time: 01/28/24  6:00 AM   Specimen: Nasopharyngeal Swab; Respiratory  Result Value Ref Range   Adenovirus NOT DETECTED NOT DETECTED   Coronavirus 229E DETECTED (A) NOT DETECTED    Comment: (NOTE) The Coronavirus on the Respiratory Panel, DOES NOT test for the novel  Coronavirus (2019 nCoV)    Coronavirus HKU1 NOT DETECTED NOT DETECTED   Coronavirus NL63 NOT DETECTED NOT DETECTED   Coronavirus OC43 NOT DETECTED NOT DETECTED   Metapneumovirus NOT DETECTED NOT DETECTED   Rhinovirus / Enterovirus NOT DETECTED NOT DETECTED   Influenza A NOT DETECTED NOT DETECTED   Influenza B NOT DETECTED NOT DETECTED   Parainfluenza Virus 1 NOT DETECTED NOT DETECTED   Parainfluenza Virus 2 NOT DETECTED NOT DETECTED   Parainfluenza Virus 3 NOT DETECTED NOT DETECTED   Parainfluenza Virus 4 NOT DETECTED NOT DETECTED   Respiratory Syncytial Virus NOT DETECTED NOT DETECTED   Bordetella pertussis NOT DETECTED NOT DETECTED   Bordetella Parapertussis NOT DETECTED NOT DETECTED   Chlamydophila pneumoniae NOT DETECTED NOT DETECTED   Mycoplasma pneumoniae NOT DETECTED NOT DETECTED    Comment: Performed at [facility], [address] [place], [place] [address]  CBC     Status: Abnormal   Collection Time: 01/29/24  4:05 AM  Result Value Ref Range   WBC 13.4 (H) 4.0 - 10.5 K/uL   RBC 5.44 4.22 - 5.81 MIL/uL   Hemoglobin 14.3 13.0 - 17.0 g/dL   HCT 78.4 69.6 - 29.5 %   MCV 81.1 80.0 - 100.0 fL   MCH 26.3 26.0 - 34.0 pg   MCHC 32.4 30.0 - 36.0 g/dL   RDW 28.4 13.2 - 44.0 %   Platelets 250 150 - 400 K/uL   nRBC 0.0 0.0 - 0.2 %    Comment: Performed at [facility]  Lab, [address], [place], [place] [address]  Comprehensive metabolic panel     Status: Abnormal   Collection Time: 01/29/24  4:05 AM  Result Value Ref Range   Sodium 136 135 - 145 mmol/L   Potassium 4.0 3.5 - 5.1 mmol/L   Chloride 109 98 - 111 mmol/L    CO2 18 (L) 22 - 32 mmol/L   Glucose, Bld 163 (H) 70 - 99 mg/dL    Comment: Glucose reference range applies only to samples taken after fasting for at least 8 hours.   BUN 32 (H) 6 - 20 mg/dL   Creatinine, Ser 8.65 0.61 - 1.24 mg/dL   Calcium  9.0 8.9 - 10.3 mg/dL   Total Protein 7.0 6.5 - 8.1 g/dL   Albumin 3.5 3.5 - 5.0 g/dL   AST 39 15 - 41 U/L   ALT 156 (H) 0 - 44 U/L   Alkaline Phosphatase 47 38 - 126 U/L   Total Bilirubin 0.8 0.0 - 1.2 mg/dL   GFR, Estimated >78 >46 mL/min    Comment: (NOTE) Calculated using the CKD-EPI Creatinine Equation (2021)    Anion gap 9 5 - 15    Comment: Performed at [facility], [address] [place], [place] [address]  Magnesium      Status: None   Collection Time: 01/29/24  4:05 AM  Result Value Ref Range   Magnesium  2.2 1.7 - 2.4 mg/dL    Comment: Performed at [facility], [address], [place], [place] [address]  Hepatitis panel, acute     Status: None   Collection Time: 01/29/24  4:05 AM  Result Value Ref Range   Hepatitis B Surface Ag NON REACTIVE NON REACTIVE   HCV Ab NON REACTIVE NON REACTIVE    Comment: (NOTE) Nonreactive HCV antibody screen is consistent with no HCV infections,  unless recent infection is suspected or other evidence exists to indicate HCV infection.     Hep A IgM NON REACTIVE NON REACTIVE   Hep B C IgM NON REACTIVE NON REACTIVE    Comment: Performed at [facility], [address], [place], [place] [address]  CBG monitoring, ED     Status: Abnormal   Collection Time: 02/01/24  6:05 PM  Result Value Ref Range   Glucose-Capillary 129 (H) 70 - 99 mg/dL    Comment: Glucose reference range applies only to samples taken after fasting for at least 8 hours.  Ethanol     Status: None   Collection Time: 02/01/24  6:11 PM  Result Value Ref Range   Alcohol, Ethyl (B) <10 <10 mg/dL    Comment: (NOTE) Lowest detectable limit for serum alcohol is 10 mg/dL.  For medical purposes only. Performed at [facility], [address], [place], [place] [address]   Protime-INR     Status: None   Collection Time: 02/01/24  6:11 PM  Result Value Ref Range   Prothrombin Time 13.3 11.4 - 15.2 seconds   INR 1.0 0.8 - 1.2    Comment: (NOTE) INR goal varies based on device and disease states. Performed at [facility], [address] [place], [place] [address]   APTT     Status: Abnormal   Collection Time: 02/01/24  6:11 PM  Result Value Ref Range   aPTT 23 (L) 24 - 36 seconds    Comment: Performed at [facility], [address], [place], [place] [address]  CBC  Status: Abnormal   Collection Time: 02/01/24  6:11 PM  Result Value Ref Range   WBC 14.1 (H) 4.0 - 10.5 K/uL   RBC 6.00 (H) 4.22 - 5.81 MIL/uL   Hemoglobin 16.1 13.0 - 17.0 g/dL   HCT 46.9 62.9 - 52.8 %   MCV 83.8 80.0 - 100.0 fL   MCH 26.8 26.0 - 34.0 pg   MCHC 32.0 30.0 - 36.0 g/dL   RDW 41.3 24.4 - 01.0 %   Platelets 260 150 - 400 K/uL   nRBC 0.0 0.0 - 0.2 %    Comment: Performed at [facility], [address] [place], [place] [address]  Differential     Status: Abnormal   Collection Time: 02/01/24  6:11 PM  Result Value Ref Range   Neutrophils Relative % 81 %   Neutro Abs 11.3 (H) 1.7 - 7.7 K/uL   Lymphocytes Relative 11 %   Lymphs Abs 1.6 0.7 - 4.0 K/uL   Monocytes Relative 6 %   Monocytes Absolute 0.9 0.1 - 1.0 K/uL   Eosinophils Relative 1 %   Eosinophils Absolute 0.2 0.0 - 0.5 K/uL   Basophils Relative 0 %   Basophils Absolute 0.0 0.0 - 0.1 K/uL   Immature Granulocytes 1 %   Abs Immature Granulocytes 0.17 (H) 0.00 - 0.07 K/uL    Comment: Performed at [facility], [address], [place], [place] [address]  Comprehensive metabolic panel     Status: Abnormal   Collection Time: 02/01/24  6:11 PM  Result Value Ref Range   Sodium 139 135 - 145 mmol/L   Potassium 3.8 3.5 - 5.1 mmol/L   Chloride 107 98 - 111 mmol/L   CO2 21 (L) 22 - 32 mmol/L   Glucose, Bld 135 (H) 70 - 99 mg/dL     Comment: Glucose reference range applies only to samples taken after fasting for at least 8 hours.   BUN 21 (H) 6 - 20 mg/dL   Creatinine, Ser 3.47 0.61 - 1.24 mg/dL   Calcium  9.6 8.9 - 10.3 mg/dL   Total Protein 7.6 6.5 - 8.1 g/dL   Albumin 4.0 3.5 - 5.0 g/dL   AST 20 15 - 41 U/L   ALT 76 (H) 0 - 44 U/L   Alkaline Phosphatase 44 38 - 126 U/L   Total Bilirubin 1.3 (H) 0.0 - 1.2 mg/dL   GFR, Estimated >42 >59 mL/min    Comment: (NOTE) Calculated using the CKD-EPI Creatinine Equation (2021)    Anion gap 11 5 - 15    Comment: Performed at [facility], [address], [place], [place] [address]  I-stat chem 8, ED     Status: Abnormal   Collection Time: 02/01/24  6:16 PM  Result Value Ref Range   Sodium 141 135 - 145 mmol/L   Potassium 3.8 3.5 - 5.1 mmol/L   Chloride 110 98 - 111 mmol/L   BUN 25 (H) 6 - 20 mg/dL   Creatinine, Ser 5.64 0.61 - 1.24 mg/dL   Glucose, Bld 332 (H) 70 - 99 mg/dL    Comment: Glucose reference range applies only to samples taken after fasting for at least 8 hours.   Calcium , Ion 1.05 (L) 1.15 - 1.40 mmol/L   TCO2 22 22 - 32 mmol/L   Hemoglobin 16.0 13.0 - 17.0 g/dL   HCT 95.1 88.4 - 16.6 %  Culture, blood (Routine X 2) w Reflex to ID Panel  Status: None   Collection Time: 02/01/24  9:22 PM   Specimen: BLOOD  Result Value Ref Range   Specimen Description BLOOD BLOOD RIGHT FOREARM    Special Requests      BOTTLES DRAWN AEROBIC AND ANAEROBIC Blood Culture results may not be optimal due to an inadequate volume of blood received in culture bottles   Culture      NO GROWTH 5 DAYS Performed at [facility], [address] [place], [place] [address]    Report Status 02/06/2024 FINAL   Culture, blood (Routine X 2) w Reflex to ID Panel     Status: None   Collection Time: 02/01/24  9:23 PM   Specimen: BLOOD  Result Value Ref Range   Specimen Description BLOOD BLOOD RIGHT ARM    Special Requests      BOTTLES DRAWN AEROBIC AND ANAEROBIC Blood  Culture adequate volume   Culture      NO GROWTH 5 DAYS Performed at [facility], [address], [place], [place] [address]    Report Status 02/06/2024 FINAL   MRSA Next Gen by PCR, Nasal     Status: None   Collection Time: 02/01/24  9:59 PM   Specimen: Nasal Mucosa; Nasal Swab  Result Value Ref Range   MRSA by PCR Next Gen NOT DETECTED NOT DETECTED    Comment: (NOTE) The GeneXpert MRSA Assay (FDA approved for NASAL specimens only), is one component of a comprehensive MRSA colonization surveillance program. It is not intended to diagnose MRSA infection nor to guide or monitor treatment for MRSA infections. Test performance is not FDA approved in patients less than 87 years old. Performed at [facility], [address] [place], [place] [address]   Heparin  level (unfractionated)     Status: Abnormal   Collection Time: 02/02/24  2:33 AM  Result Value Ref Range   Heparin  Unfractionated 0.21 (L) 0.30 - 0.70 IU/mL    Comment: (NOTE) The clinical reportable range upper limit is being lowered to >1.10 to align with the FDA approved guidance for the current laboratory assay.  If heparin  results are below expected values, and patient dosage has  been confirmed, suggest follow up testing of antithrombin III levels. Performed at [facility], [address], [place], [place] [address]   CBC     Status: Abnormal   Collection Time: 02/02/24  2:33 AM  Result Value Ref Range   WBC 15.2 (H) 4.0 - 10.5 K/uL   RBC 5.53 4.22 - 5.81 MIL/uL   Hemoglobin 14.9 13.0 - 17.0 g/dL   HCT 52.8 41.3 - 24.4 %   MCV 81.7 80.0 - 100.0 fL   MCH 26.9 26.0 - 34.0 pg   MCHC 33.0 30.0 - 36.0 g/dL   RDW 01.0 27.2 - 53.6 %   Platelets 260 150 - 400 K/uL   nRBC 0.0 0.0 - 0.2 %    Comment: Performed at [facility], [address], [place], [place] [address]  Magnesium      Status: None   Collection Time: 02/02/24  2:33 AM  Result Value Ref Range   Magnesium  2.3 1.7 - 2.4 mg/dL     Comment: Performed at [facility], [address] [place], [place] [address]  Heparin  level (unfractionated)     Status: None   Collection Time: 02/02/24  6:58 AM  Result Value Ref Range   Heparin  Unfractionated 0.39 0.30 - 0.70 IU/mL  Comment: (NOTE) The clinical reportable range upper limit is being lowered to >1.10 to align with the FDA approved guidance for the current laboratory assay.  If heparin  results are below expected values, and patient dosage has  been confirmed, suggest follow up testing of antithrombin III levels. Performed at [facility], [address], [place], [place] [address]   Urine rapid drug screen (hosp performed)     Status: None   Collection Time: 02/02/24  8:52 AM  Result Value Ref Range   Opiates NONE DETECTED NONE DETECTED   Cocaine NONE DETECTED NONE DETECTED   Benzodiazepines NONE DETECTED NONE DETECTED   Amphetamines NONE DETECTED NONE DETECTED   Tetrahydrocannabinol NONE DETECTED NONE DETECTED   Barbiturates NONE DETECTED NONE DETECTED    Comment: (NOTE) DRUG SCREEN FOR MEDICAL PURPOSES ONLY.  IF CONFIRMATION IS NEEDED FOR ANY PURPOSE, NOTIFY LAB WITHIN 5 DAYS.  LOWEST DETECTABLE LIMITS FOR URINE DRUG SCREEN Drug Class                     Cutoff (ng/mL) Amphetamine and metabolites    1000 Barbiturate and metabolites    200 Benzodiazepine                 200 Opiates and metabolites        300 Cocaine and metabolites        300 THC                            50 Performed at [facility], [address] [place], [place] [address]   Urinalysis, Routine w reflex microscopic -Urine, Clean Catch     Status: Abnormal   Collection Time: 02/02/24  8:52 AM  Result Value Ref Range   Color, Urine AMBER (A) YELLOW    Comment: BIOCHEMICALS MAY BE AFFECTED BY COLOR   APPearance CLEAR CLEAR   Specific Gravity, Urine 1.020 1.005 - 1.030   pH 5.5 5.0 - 8.0   Glucose, UA NEGATIVE NEGATIVE mg/dL   Hgb urine dipstick NEGATIVE  NEGATIVE   Bilirubin Urine NEGATIVE NEGATIVE   Ketones, ur NEGATIVE NEGATIVE mg/dL   Protein, ur NEGATIVE NEGATIVE mg/dL   Nitrite NEGATIVE NEGATIVE   Leukocytes,Ua NEGATIVE NEGATIVE    Comment: Microscopic not done on urines with negative protein, blood, leukocytes, nitrite, or glucose < 500 mg/dL. Performed at [facility], [address] [place], [place] [address]   Heparin  level (unfractionated)     Status: None   Collection Time: 02/02/24  2:03 PM  Result Value Ref Range   Heparin  Unfractionated 0.66 0.30 - 0.70 IU/mL    Comment: (NOTE) The clinical reportable range upper limit is being lowered to >1.10 to align with the FDA approved guidance for the current laboratory assay.  If heparin  results are below expected values, and patient dosage has  been confirmed, suggest follow up testing of antithrombin III levels. Performed at [facility], [address] [place], [place] [address]   POCT Activated clotting time     Status: None   Collection Time: 02/02/24  6:44 PM  Result Value Ref Range   Activated Clotting Time 245 seconds    Comment: Reference range 74-137 seconds for patients not on anticoagulant therapy.  I-STAT 7, (LYTES, BLD GAS, ICA, H+H)     Status: Abnormal   Collection Time: 02/02/24  7:57 PM  Result Value Ref Range   pH,  Arterial 7.359 7.35 - 7.45   pCO2 arterial 35.9 32 - 48 mmHg   pO2, Arterial 495 (H) 83 - 108 mmHg   Bicarbonate 20.6 20.0 - 28.0 mmol/L   TCO2 22 22 - 32 mmol/L   O2 Saturation 100 %   Acid-base deficit 5.0 (H) 0.0 - 2.0 mmol/L   Sodium 143 135 - 145 mmol/L   Potassium 3.9 3.5 - 5.1 mmol/L   Calcium , Ion 1.13 (L) 1.15 - 1.40 mmol/L   HCT 38.0 (L) 39.0 - 52.0 %   Hemoglobin 12.9 (L) 13.0 - 17.0 g/dL   Patient temperature 40.9 C    Collection site RADIAL, ALLEN'S TEST ACCEPTABLE    Drawn by RT    Sample type ARTERIAL   Basic metabolic panel     Status: Abnormal   Collection Time: 02/03/24  5:05 AM  Result Value Ref Range    Sodium 140 135 - 145 mmol/L   Potassium 3.6 3.5 - 5.1 mmol/L   Chloride 115 (H) 98 - 111 mmol/L   CO2 21 (L) 22 - 32 mmol/L   Glucose, Bld 109 (H) 70 - 99 mg/dL    Comment: Glucose reference range applies only to samples taken after fasting for at least 8 hours.   BUN 13 6 - 20 mg/dL   Creatinine, Ser 8.11 0.61 - 1.24 mg/dL   Calcium  8.3 (L) 8.9 - 10.3 mg/dL   GFR, Estimated >91 >47 mL/min    Comment: (NOTE) Calculated using the CKD-EPI Creatinine Equation (2021)    Anion gap 4 (L) 5 - 15    Comment: Performed at [facility], [address], [place], [place] [address]  Magnesium      Status: None   Collection Time: 02/03/24  5:05 AM  Result Value Ref Range   Magnesium  2.1 1.7 - 2.4 mg/dL    Comment: Performed at [facility], [address], [place], [place] [address]  Phosphorus     Status: None   Collection Time: 02/03/24  5:05 AM  Result Value Ref Range   Phosphorus 3.9 2.5 - 4.6 mg/dL    Comment: Performed at [facility], [address], [place], [place] [address]  CBC with Differential/Platelet     Status: Abnormal   Collection Time: 02/03/24  5:05 AM  Result Value Ref Range   WBC 10.3 4.0 - 10.5 K/uL   RBC 4.52 4.22 - 5.81 MIL/uL   Hemoglobin 12.1 (L) 13.0 - 17.0 g/dL   HCT 69.6 (L) 29.5 - 28.4 %   MCV 83.0 80.0 - 100.0 fL   MCH 26.8 26.0 - 34.0 pg   MCHC 32.3 30.0 - 36.0 g/dL   RDW 13.2 44.0 - 10.2 %   Platelets 237 150 - 400 K/uL   nRBC 0.0 0.0 - 0.2 %   Neutrophils Relative % 80 %   Neutro Abs 8.3 (H) 1.7 - 7.7 K/uL   Lymphocytes Relative 11 %   Lymphs Abs 1.1 0.7 - 4.0 K/uL   Monocytes Relative 7 %   Monocytes Absolute 0.8 0.1 - 1.0 K/uL   Eosinophils Relative 0 %   Eosinophils Absolute 0.0 0.0 - 0.5 K/uL   Basophils Relative 0 %   Basophils Absolute 0.0 0.0 - 0.1 K/uL   Immature Granulocytes 2 %   Abs Immature Granulocytes 0.16 (H) 0.00 - 0.07 K/uL    Comment: Performed at [facility], [address], [place], [place] [address]   Heparin  level (unfractionated)  Status: Abnormal   Collection Time: 02/03/24  5:05 AM  Result Value Ref Range   Heparin  Unfractionated >1.10 (H) 0.30 - 0.70 IU/mL    Comment: (NOTE) The clinical reportable range upper limit is being lowered to >1.10 to align with the FDA approved guidance for the current laboratory assay.  If heparin  results are below expected values, and patient dosage has  been confirmed, suggest follow up testing of antithrombin III levels. Performed at [facility], [address], [place], [place] [address]   Triglycerides     Status: Abnormal   Collection Time: 02/03/24  5:05 AM  Result Value Ref Range   Triglycerides 155 (H) <150 mg/dL    Comment: Performed at [facility], [address], [place], [place] [address]  Heparin  level (unfractionated)     Status: Abnormal   Collection Time: 02/03/24  1:25 PM  Result Value Ref Range   Heparin  Unfractionated 0.85 (H) 0.30 - 0.70 IU/mL    Comment: (NOTE) The clinical reportable range upper limit is being lowered to >1.10 to align with the FDA approved guidance for the current laboratory assay.  If heparin  results are below expected values, and patient dosage has  been confirmed, suggest follow up testing of antithrombin III levels. Performed at [facility], [address] [place], [place] [address]   Heparin  level (unfractionated)     Status: Abnormal   Collection Time: 02/03/24  9:57 PM  Result Value Ref Range   Heparin  Unfractionated <0.10 (L) 0.30 - 0.70 IU/mL    Comment: (NOTE) The clinical reportable range upper limit is being lowered to >1.10 to align with the FDA approved guidance for the current laboratory assay.  If heparin  results are below expected values, and patient dosage has  been confirmed, suggest follow up testing of antithrombin III levels. Performed at [facility], [address] [place], [place] [address]   CBC     Status: Abnormal   Collection  Time: 02/04/24  6:09 AM  Result Value Ref Range   WBC 7.6 4.0 - 10.5 K/uL   RBC 4.17 (L) 4.22 - 5.81 MIL/uL   Hemoglobin 11.1 (L) 13.0 - 17.0 g/dL   HCT 29.5 (L) 62.1 - 30.8 %   MCV 83.7 80.0 - 100.0 fL   MCH 26.6 26.0 - 34.0 pg   MCHC 31.8 30.0 - 36.0 g/dL   RDW 65.7 84.6 - 96.2 %   Platelets 209 150 - 400 K/uL   nRBC 0.0 0.0 - 0.2 %    Comment: Performed at [facility], [address], [place], [place] [address]  Basic metabolic panel     Status: Abnormal   Collection Time: 02/04/24  6:09 AM  Result Value Ref Range   Sodium 141 135 - 145 mmol/L   Potassium 3.4 (L) 3.5 - 5.1 mmol/L   Chloride 111 98 - 111 mmol/L   CO2 21 (L) 22 - 32 mmol/L   Glucose, Bld 96 70 - 99 mg/dL    Comment: Glucose reference range applies only to samples taken after fasting for at least 8 hours.   BUN 12 6 - 20 mg/dL   Creatinine, Ser 1.32 0.61 - 1.24 mg/dL   Calcium  8.4 (L) 8.9 - 10.3 mg/dL   GFR, Estimated >44 >01 mL/min    Comment: (NOTE) Calculated using the CKD-EPI Creatinine Equation (2021)    Anion gap 9 5 - 15    Comment: Performed at [facility], [address] [place], [place] [address]  Magnesium      Status: None   Collection Time: 02/04/24  6:09 AM  Result Value Ref Range   Magnesium  2.1 1.7 - 2.4 mg/dL    Comment: Performed at [facility], [address] [place], [place] [address]  Phosphorus     Status: None   Collection Time: 02/04/24  6:09 AM  Result Value Ref Range   Phosphorus 3.2 2.5 - 4.6 mg/dL    Comment: Performed at [facility], [address] [place], [place] [address]  Heparin  level (unfractionated)     Status: Abnormal   Collection Time: 02/04/24  6:09 AM  Result Value Ref Range   Heparin  Unfractionated 0.27 (L) 0.30 - 0.70 IU/mL    Comment: (NOTE) The clinical reportable range upper limit is being lowered to >1.10 to align with the FDA approved guidance for the current laboratory assay.  If heparin  results are below expected values,  and patient dosage has  been confirmed, suggest follow up testing of antithrombin III levels. Performed at [facility], [address] [place], [place] [address]   CBC     Status: Abnormal   Collection Time: 02/05/24  6:28 AM  Result Value Ref Range   WBC 6.4 4.0 - 10.5 K/uL   RBC 4.24 4.22 - 5.81 MIL/uL   Hemoglobin 11.6 (L) 13.0 - 17.0 g/dL   HCT 65.7 (L) 84.6 - 96.2 %   MCV 83.0 80.0 - 100.0 fL   MCH 27.4 26.0 - 34.0 pg   MCHC 33.0 30.0 - 36.0 g/dL   RDW 95.2 84.1 - 32.4 %   Platelets 217 150 - 400 K/uL   nRBC 0.0 0.0 - 0.2 %    Comment: Performed at [facility], [address], [place], [place] [address]  Basic metabolic panel     Status: Abnormal   Collection Time: 02/05/24  6:28 AM  Result Value Ref Range   Sodium 139 135 - 145 mmol/L   Potassium 3.5 3.5 - 5.1 mmol/L   Chloride 109 98 - 111 mmol/L   CO2 24 22 - 32 mmol/L   Glucose, Bld 102 (H) 70 - 99 mg/dL    Comment: Glucose reference range applies only to samples taken after fasting for at least 8 hours.   BUN 10 6 - 20 mg/dL   Creatinine, Ser 7.25 0.61 - 1.24 mg/dL   Calcium  8.5 (L) 8.9 - 10.3 mg/dL   GFR, Estimated >36 >64 mL/min    Comment: (NOTE) Calculated using the CKD-EPI Creatinine Equation (2021)    Anion gap 6 5 - 15    Comment: Performed at [facility], [address] [place], [place] [address]  Magnesium      Status: None   Collection Time: 02/05/24  6:28 AM  Result Value Ref Range   Magnesium  1.9 1.7 - 2.4 mg/dL    Comment: Performed at [facility], [address], [place], [place] [address]  Phosphorus     Status: None   Collection Time: 02/05/24  6:28 AM  Result Value Ref Range   Phosphorus 3.0 2.5 - 4.6 mg/dL    Comment: Performed at [facility], [address] [place], [place] [address]  Basic metabolic panel     Status: Abnormal   Collection Time: 02/06/24  7:45 AM  Result Value Ref Range   Sodium 139 135 - 145 mmol/L  Potassium 3.6 3.5 - 5.1 mmol/L    Chloride 110 98 - 111 mmol/L   CO2 24 22 - 32 mmol/L   Glucose, Bld 142 (H) 70 - 99 mg/dL    Comment: Glucose reference range applies only to samples taken after fasting for at least 8 hours.   BUN 14 6 - 20 mg/dL   Creatinine, Ser 1.61 0.61 - 1.24 mg/dL   Calcium  8.7 (L) 8.9 - 10.3 mg/dL   GFR, Estimated >09 >60 mL/min    Comment: (NOTE) Calculated using the CKD-EPI Creatinine Equation (2021)    Anion gap 5 5 - 15    Comment: Performed at [facility], [address], [place], [place] [address]  CBC     Status: Abnormal   Collection Time: 02/06/24  7:45 AM  Result Value Ref Range   WBC 7.4 4.0 - 10.5 K/uL   RBC 4.85 4.22 - 5.81 MIL/uL   Hemoglobin 12.9 (L) 13.0 - 17.0 g/dL   HCT 81.1 91.4 - 78.2 %   MCV 82.9 80.0 - 100.0 fL   MCH 26.6 26.0 - 34.0 pg   MCHC 32.1 30.0 - 36.0 g/dL   RDW 95.6 21.3 - 08.6 %   Platelets 255 150 - 400 K/uL   nRBC 0.0 0.0 - 0.2 %    Comment: Performed at [facility], [address], [place], [place] [address]  Magnesium      Status: None   Collection Time: 02/06/24  7:45 AM  Result Value Ref Range   Magnesium  2.2 1.7 - 2.4 mg/dL    Comment: Performed at [facility], [address] [place], [place] [address]  Glucose, capillary     Status: Abnormal   Collection Time: 02/08/24  3:30 AM  Result Value Ref Range   Glucose-Capillary 116 (H) 70 - 99 mg/dL    Comment: Glucose reference range applies only to samples taken after fasting for at least 8 hours.   Comment 1 Notify RN    Comment 2 Document in Chart   CBC     Status: Abnormal   Collection Time: 02/08/24  6:48 AM  Result Value Ref Range   WBC 7.5 4.0 - 10.5 K/uL   RBC 4.44 4.22 - 5.81 MIL/uL   Hemoglobin 11.9 (L) 13.0 - 17.0 g/dL   HCT 28.4 (L) 13.2 - 44.0 %   MCV 82.9 80.0 - 100.0 fL   MCH 26.8 26.0 - 34.0 pg   MCHC 32.3 30.0 - 36.0 g/dL   RDW 10.2 72.5 - 36.6 %   Platelets 250 150 - 400 K/uL   nRBC 0.0 0.0 - 0.2 %    Comment: Performed at [facility],  [address], [place], [place] [address]  CBC     Status: Abnormal   Collection Time: 02/09/24  6:30 AM  Result Value Ref Range   WBC 7.9 4.0 - 10.5 K/uL   RBC 4.52 4.22 - 5.81 MIL/uL   Hemoglobin 12.2 (L) 13.0 - 17.0 g/dL   HCT 74.2 (L) 59.5 - 63.8 %   MCV 84.3 80.0 - 100.0 fL   MCH 27.0 26.0 - 34.0 pg   MCHC 32.0 30.0 - 36.0 g/dL   RDW 75.6 43.3 - 29.5 %   Platelets 147 (L) 150 - 400 K/uL   nRBC 0.0 0.0 - 0.2 %    Comment: Performed at [facility], [address], [place], [place] [address]  CBC  Status: Abnormal   Collection Time: 02/10/24  5:14 AM  Result Value Ref Range   WBC 7.6 4.0 - 10.5 K/uL   RBC 4.58 4.22 - 5.81 MIL/uL   Hemoglobin 12.4 (L) 13.0 - 17.0 g/dL   HCT 78.2 (L) 95.6 - 21.3 %   MCV 83.2 80.0 - 100.0 fL   MCH 27.1 26.0 - 34.0 pg   MCHC 32.5 30.0 - 36.0 g/dL   RDW 08.6 57.8 - 46.9 %   Platelets 257 150 - 400 K/uL   nRBC 0.0 0.0 - 0.2 %    Comment: Performed at [facility], [address], [place], [place] [address]  Basic metabolic panel     Status: Abnormal   Collection Time: 02/10/24  8:39 AM  Result Value Ref Range   Sodium 140 135 - 145 mmol/L   Potassium 3.9 3.5 - 5.1 mmol/L   Chloride 105 98 - 111 mmol/L   CO2 23 22 - 32 mmol/L   Glucose, Bld 142 (H) 70 - 99 mg/dL    Comment: Glucose reference range applies only to samples taken after fasting for at least 8 hours.   BUN 16 6 - 20 mg/dL   Creatinine, Ser 8.41 0.61 - 1.24 mg/dL   Calcium  9.6 8.9 - 10.3 mg/dL   GFR, Estimated >32 >44 mL/min    Comment: (NOTE) Calculated using the CKD-EPI Creatinine Equation (2021)    Anion gap 12 5 - 15    Comment: Performed at [facility], [address], [place], [place] [address]    Assessment/Plan: No problem-specific Assessment & Plan notes found for this encounter.

## 2024-03-02 NOTE — Code Documentation (Signed)
 Stroke Response Nurse Documentation Code Documentation  Samuel Warren is a 33 y.o. male arriving to [facility]  via [facility] on 03-02-2024 with past medical hx of cerebral venous sinus thrombosis. On Eliquis  (apixaban ) daily. Code stroke was activated by EMS.   Patient from home where he was LKW at 1445 and now complaining of Right side weakness . 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  While waiting in a chair he developed Right side weakness.  Per EMS the weakness has improved since they have had him lying on their stretcher.  There is a communication barrier per report.  The Patient is deaf and communicates with a montanyard interpreter with sign language.  Stroke team at the bedside on patient arrival. Labs drawn and patient cleared for CT by Dr. Scarlette Currier. Patient to CT with team. NIHSS 0, see documentation for details and code stroke times. Unable ask him questions to answer questions or assess for aphasia or dysarthria on exam. Awaiting language interpreter.  The following imaging was completed:  CT Head. Patient is not a candidate for IV Thrombolytic due to being on Eliquis . Patient is not a candidate for IR due to no LVO suspected.   Care Plan: VS & NIHSS q 2 hours x 12 hours.    Bedside handoff with ED RN Oakley Bellman.    Waldemar Guillaume  Stroke Response RN

## 2024-03-02 NOTE — Consult Note (Addendum)
 NEUROLOGY CONSULT NOTE   Date of service: March 02, 2024 Patient Name: Samuel Warren MRN:  [id number] DOB:  1991/01/07 Chief Complaint: "CODE STROKE" Requesting Provider: Sueellen Emery, MD  History of Present Illness  Samuel Warren is a 33 y.o. 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  Patient was at a primary care physician's office, was asked to sign his name and was unable to, office staff called EMS.  Patient is on Eliquis , posttreatment for bacterial meningitis. On exam at bridge, patient is alert, is able to mimic, no drift seen in extremities, withdraws equally in all extremities, no extinction.   LKW: 1445 Modified rankin score: 0-Completely asymptomatic and back to baseline post- stroke IV Thrombolysis: No, on eliquis   NIHSS components Score: Comment  1a Level of Conscious 0[x]  1[]  2[]  3[]      1b LOC Questions 0[]  1[]  2[x]      Mute at baseline  1c LOC Commands 0[x]  1[]  2[]       2 Best Gaze 0[x]  1[]  2[]       3 Visual 0[x]  1[]  2[]  3[]      4 Facial Palsy 0[x]  1[]  2[]  3[]      5a Motor Arm - left 0[x]  1[]  2[]  3[]  4[]  UN[]    5b Motor Arm - Right 0[x]  1[]  2[]  3[]  4[]  UN[]    6a Motor Leg - Left 0[x]  1[]  2[]  3[]  4[]  UN[]    6b Motor Leg - Right 0[x]  1[]  2[]  3[]  4[]  UN[]    7 Limb Ataxia 0[x]  1[]  2[]  3[]  UN[]     8 Sensory 0[x]  1[]  2[]  UN[]      9 Best Language 0[]  1[]  2[]  3[x]     Mute at baseline  10 Dysarthria 0[]  1[]  2[x]  UN[]     Mute at baseline  11 Extinct. and Inattention 0[x]  1[]  2[]       TOTAL:   7      ROS    Unable to ascertain due to inability to communicate fully with patient, no montagnard sign language interpreter available, message left with brother.   Past History   Past Medical History:  Diagnosis Date   Deaf     Past Surgical History:  Procedure Laterality Date   IR ANGIO INTRA EXTRACRAN SEL COM  CAROTID INNOMINATE BILAT MOD SED  02/02/2024   IR THROMBECT VENO MECH MOD SED  02/02/2024   IR VENO/JUGULAR LEFT  02/02/2024   RADIOLOGY WITH ANESTHESIA N/A 02/02/2024   Procedure: RADIOLOGY WITH ANESTHESIA;  Surgeon: Luellen Sages, MD;  Location: [facility] OR;  Service: Radiology;  Laterality: N/A;    Family History: No family history on file.  Social History  reports that he has never smoked. He has never used smokeless tobacco. He reports that he does not currently use alcohol. He reports that he does not currently use drugs.  No Known Allergies  Medications  No current facility-administered medications for this encounter.  Current Outpatient Medications:    apixaban  (ELIQUIS ) 5 MG TABS tablet, Take 1 tablet (5 mg total) by mouth 2 (two) times daily., Disp: 60 tablet, Rfl: 0   atorvastatin  (LIPITOR ) 80 MG tablet, Take 1 tablet (80 mg total) by mouth daily., Disp: 30 tablet, Rfl: 0   ondansetron  (ZOFRAN ) 4 MG tablet, Take 1 tablet (4 mg total) by mouth every 6 (six) hours as needed for nausea., Disp: 20 tablet, Rfl: 0  Vitals  Vitals:   03/14/2024 1600  Weight: 66.8 kg    Body mass index is 25.28 kg/m.  Physical Exam   Constitutional: Appears well-developed and well-nourished.  Psych: Affect appropriate to situation.  Eyes: No scleral injection.  HENT: No OP obstruction.  Head: Normocephalic.  Cardiovascular: Normal rate and regular rhythm.  Respiratory: Effort normal, non-labored breathing.  GI: Soft.  No distension. There is no tenderness.  Skin: WDI.   Neurologic Examination   Neuro: Mental Status: Patient is awake, alert, deaf and mute.   Cranial Nerves: II: Pupils are equal, round, and reactive to light.  Blinks to threat bilaterally.  III,IV, VI: EOMI without ptosis.  V: Facial sensation is intact, unable to assess symmetry VII: Facial movement is symmetric.  VIII: deaf at baseline X: Uvula elevates symmetrically XI: Shoulder shrug is symmetric. XII: tongue  is midline without atrophy or fasciculations.  Motor: Tone is normal. Bulk is normal. 5/5 strength was present in all four extremities.  No drifts present Sensory: Sensation is symmetric to light touch in the arms and legs, uta symmetry but withdraws equally.  Cerebellar: UTA   Labs/Imaging/Neurodiagnostic studies   CBC:  Recent Labs  Lab 03-14-24 1620  HGB 13.6  HCT 40.0   Basic Metabolic Panel:  Lab Results  Component Value Date   NA 143 14-Mar-2024   K 3.4 (L) 03-14-24   CO2 23 02/10/2024   GLUCOSE 104 (H) 2024/03/14   BUN 6 Mar 14, 2024   CREATININE 1.00 03-14-2024   CALCIUM  9.6 02/10/2024   GFRNONAA >60 02/10/2024   Lipid Panel:  Lab Results  Component Value Date   LDLCALC 279 (H) 01/27/2024   HgbA1c:  Lab Results  Component Value Date   HGBA1C 5.7 (H) 01/26/2024   Urine Drug Screen:     Component Value Date/Time   LABOPIA NONE DETECTED 02/02/2024 0852   COCAINSCRNUR NONE DETECTED 02/02/2024 0852   LABBENZ NONE DETECTED 02/02/2024 0852   AMPHETMU NONE DETECTED 02/02/2024 0852   THCU NONE DETECTED 02/02/2024 0852   LABBARB NONE DETECTED 02/02/2024 0852    Alcohol Level     Component Value Date/Time   ETH <10 02/01/2024 1811   INR  Lab Results  Component Value Date   INR 1.0 02/01/2024   APTT  Lab Results  Component Value Date   APTT 23 (L) 02/01/2024   AED levels: No results found for: "PHENYTOIN", "ZONISAMIDE", "LAMOTRIGINE", "LEVETIRACETA"  CT Head without contrast(Personally reviewed): Known venous infarct within medial left frontal lobe has increased in extent since CT on 3/24, now spanning 2.6 cm Known subacute infarcts elsewhere within medial frontal lobes bilaterally  CT Venogram Head (Personally reviewed): Residual/recurrent occlusive and subocclusive thrombi within the superior sagittal sinus posteriorly, extending to confluence of sinuses Thrombus extends into medial aspect of the right transverse dural venous sinus New from CT  venogram 3/23, some reconstitution of enhancement within right transverse dural venous sinus more laterally.  Clot burden within right sigmoid sinus has decreased Residual/recurrent subocclusive thrombus within left transverse and sigmoid dural venous sinuses Persistent bilateral cavernous sinus thrombosis expected  MRI Brain(Personally reviewed): PENDING    ASSESSMENT   Samuel Warren is a 33 y.o. 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  On exam at bridge, patient is alert, is able to mimic, no drift seen in extremities, withdraws equally in all extremities, no extinction.   Impression: DVST with increased venous infarct, increased symptoms also likely secondary to hypotension/dehydration   RECOMMENDATIONS   - Recommend aggressive hydration - Avoid hypotension - Monitor for headaches - MRI [place]  - No need for echo, lipid, A1c as he recently has these completed in March.  Stroke team to follow in consult. ____________________________________________________________________    Signed, Audrene Lease, NP [facility]    Attending Neurohospitalist Addendum Patient seen and examined with APP/Resident. Agree with the history and physical as documented above. Agree with the plan as documented, which I helped formulate. I have edited the note above to reflect my full findings and recommendations. I have independently reviewed the chart, obtained history, review of systems and examined the patient.I have personally reviewed pertinent head/neck/spine imaging (CT/MRI). Please feel free to call with any questions.  -- Greg Leaks, MD [facility] [phone]  If 7pm- 7am, please page neurology on call as listed in AMION.

## 2024-03-02 NOTE — ED Notes (Signed)
 PT via his brother as a interpretor is still c/o of a non-stop throbbing headache.

## 2024-03-02 NOTE — H&P (Signed)
 History and Physical    PatientTipton Warren UJW:[id number] DOB: [birth year] DOA: 03/02/2024 DOS: the patient was seen and examined on 03/02/2024 PCP: Alto At[place], NP   Patient coming from: Home/PCP office.  Chief Complaint:  Chief Complaint  Patient presents with   Code Stroke   HPI: Samuel Warren is a 33 y.o. male with medical history significant of history of stroke/extensive venous thrombosis, prior history of meningoencephalitis, hyperlipidemia and GERD; patient is [place] and deaf at baseline which complicates and limited history.  Brought in by EMS from PCP office secondary to right-sided weakness.  Apparently symptoms present especially when standing or sitting up, partially resolving when laying down.  No fever, no chest pain, no nausea, no vomiting, no overt bleeding.  Workup in the ED demonstrating no acute findings for stroke; similar presentation to previous abnormalities.  MRI has been ordered and pending.  Mild hypokalemia and ALT of 54 appreciated at time of admission.  Normal CBC.  Neurology service consulted with recommendations for patient to be admitted in order to proceed with MRI, fluid resuscitation and determine the need of further stroke workup.  Continue Eliquis  and statin for secondary prevention.  Review of Systems: As mentioned in the history of present illness. All other systems reviewed and are negative. Past Medical History:  Diagnosis Date   Deaf    Past Surgical History:  Procedure Laterality Date   IR ANGIO INTRA EXTRACRAN SEL COM CAROTID INNOMINATE BILAT MOD SED  02/02/2024   IR THROMBECT VENO MECH MOD SED  02/02/2024   IR VENO/JUGULAR LEFT  02/02/2024   RADIOLOGY WITH ANESTHESIA N/A 02/02/2024   Procedure: RADIOLOGY WITH ANESTHESIA;  Surgeon: Luellen Sages, MD;  Location: [facility] OR;  Service: Radiology;  Laterality: N/A;   Social History:  reports that he has never smoked. He has never used smokeless tobacco. He reports that he does not  currently use alcohol. He reports that he does not currently use drugs.  No Known Allergies  History reviewed. No pertinent family history.  Prior to Admission medications   Medication Sig Start Date End Date Taking? Authorizing Provider  apixaban  (ELIQUIS ) 5 MG TABS tablet Take 1 tablet (5 mg total) by mouth 2 (two) times daily. 02/11/24 03/12/24  Modena Andes, MD  atorvastatin  (LIPITOR ) 80 MG tablet Take 1 tablet (80 mg total) by mouth daily. 02/10/24 03/11/24  Modena Andes, MD  ondansetron  (ZOFRAN ) 4 MG tablet Take 1 tablet (4 mg total) by mouth every 6 (six) hours as needed for nausea. 01/29/24   Audria Leather, MD    Physical Exam: Vitals:   03/02/24 1638 03/02/24 1645 03/02/24 1646 03/02/24 1648  BP: 123/79 114/81    Pulse: 64 60    Resp: 14 14    Temp: (!) 97.5 F (36.4 C)     SpO2: 98% 97% 98%   Weight:    66.8 kg  Height:    5\' 4"  (1.626 m)   General exam: Alert, awake, afebrile and in no acute distress. Respiratory system: Clear to auscultation. Respiratory effort normal.  Good saturation on room air. Cardiovascular system:RRR. No rubs or gallop; no JVD. Gastrointestinal system: Abdomen is nondistended, soft and nontender. No organomegaly or masses felt. Normal bowel sounds heard. Central nervous system: Decreased muscle strength appreciated on his right side (upper and lower extremity); per chart review there was some residual deficits appreciated on his right side at time of discharge. Extremities: No cyanosis or clubbing. Skin: No petechiae. Psychiatry: Mood & affect appropriate.  Limited examination  as patient is mute at baseline.  Data Reviewed: CBC: White blood cell 6.7, hemoglobin 13.1 platelet count 229K Comprehensive metabolic panel: Sodium 143, potassium 3.4, chloride 110, bicarb 21, BUN 5, creatinine 1.05, ALT 54 and GFR >60   Assessment and Plan: 1-right-sided weakness/severe chronic cavernous venous thrombosis -Will assess for any signs of headaches or  neurologic deficits - Follow MRI - Continue Eliquis  and statin for secondary prevention - Neurology service has been consulted and will follow recommendation - No need for echo, lipid panel or A1c at these were recently completed. - Swallowing test and physical therapy evaluation has been requested - Will maintain adequate hydration - Checking orthostatic vital signs, cortisol and TSH level.  2-hypokalemia - Will check magnesium , replete electrolytes and follow trend.  3-hyperlipidemia - Continue statin.  4-GERD/GI prophylaxis - Continue PPI.    Advance Care Planning:   Code Status: Full Code   Consults: Cardiology service.  Family Communication: No family at bedside.  Severity of Illness: The appropriate patient status for this patient is OBSERVATION. Observation status is judged to be reasonable and necessary in order to provide the required intensity of service to ensure the patient's safety. The patient's presenting symptoms, physical exam findings, and initial radiographic and laboratory data in the context of their medical condition is felt to place them at decreased risk for further clinical deterioration. Furthermore, it is anticipated that the patient will be medically stable for discharge from the hospital within 2 midnights of admission.   Author: Justina Oman, MD 03/02/2024 6:23 PM  For on call review [email or web address].

## 2024-03-02 NOTE — ED Notes (Signed)
 Called and placed PT on monitor with CCMD.

## 2024-03-02 NOTE — ED Provider Notes (Signed)
 [place] EMERGENCY DEPARTMENT AT [facility] Provider Note   CSN: [id number] Arrival date & time: 03/02/24  1608  An emergency department physician performed an initial assessment on this suspected stroke patient at 1.  History  Chief Complaint  Patient presents with   Code Stroke    Samuel Warren is a 33 y.o. male.  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  Pt was d/c with eliquis  and has been taking it.  History is complicated by the fact that pt is deaf, does not speak ASL and has a [place] native language.  Per EMS, pt has deficits when he sits up, but they resolve with lying down.  Pt has been taking his meds.               Home Medications Prior to Admission medications   Medication Sig Start Date End Date Taking? Authorizing Provider  apixaban  (ELIQUIS ) 5 MG TABS tablet Take 1 tablet (5 mg total) by mouth 2 (two) times daily. 02/11/24 03/12/24  Modena Andes, MD  atorvastatin  (LIPITOR ) 80 MG tablet Take 1 tablet (80 mg total) by mouth daily. 02/10/24 03/11/24  Modena Andes, MD  ondansetron  (ZOFRAN ) 4 MG tablet Take 1 tablet (4 mg total) by mouth every 6 (six) hours as needed for nausea. 01/29/24   Audria Leather, MD      Allergies    Patient has no known allergies.    Review of Systems   Review of Systems  Neurological:  Positive for weakness.  All other systems reviewed and are negative.   Physical Exam Updated Vital Signs BP 114/81   Pulse 60   Temp (!) 97.5 F (36.4 C)   Resp 14   Ht 5\' 4"  (1.626 m)   Wt 66.8 kg   SpO2 98%   BMI 25.28 kg/m  Physical Exam Vitals and nursing note reviewed.  Constitutional:      Appearance: Normal appearance.  HENT:     Head: Normocephalic and atraumatic.     Right Ear: External ear normal.     Left Ear: External ear normal.      Nose: Nose normal.     Mouth/Throat:     Mouth: Mucous membranes are moist.     Pharynx: Oropharynx is clear.  Eyes:     Extraocular Movements: Extraocular movements intact.     Pupils: Pupils are equal, round, and reactive to light.  Cardiovascular:     Rate and Rhythm: Normal rate and regular rhythm.     Pulses: Normal pulses.     Heart sounds: Normal heart sounds.  Pulmonary:     Effort: Pulmonary effort is normal.     Breath sounds: Normal breath sounds.  Abdominal:     General: Abdomen is flat. Bowel sounds are normal.     Palpations: Abdomen is soft.  Musculoskeletal:        General: Normal range of motion.     Cervical back: Normal range of motion and neck supple.  Skin:    General: Skin is warm.     Capillary Refill: Capillary refill takes less than 2 seconds.  Neurological:     General: No focal deficit present.     Mental Status: He is alert and oriented to person, place, and time.  Psychiatric:  Mood and Affect: Mood normal.        Behavior: Behavior normal.     ED Results / Procedures / Treatments   Labs (all labs ordered are listed, but only abnormal results are displayed) Labs Reviewed  PROTIME-INR - Abnormal; Notable for the following components:      Result Value   Prothrombin Time 16.9 (*)    INR 1.4 (*)    All other components within normal limits  COMPREHENSIVE METABOLIC PANEL WITH GFR - Abnormal; Notable for the following components:   Potassium 3.4 (*)    CO2 21 (*)    Glucose, Bld 104 (*)    BUN 5 (*)    ALT 54 (*)    All other components within normal limits  I-STAT CHEM 8, ED - Abnormal; Notable for the following components:   Potassium 3.4 (*)    Glucose, Bld 104 (*)    Calcium , Ion 1.10 (*)    All other components within normal limits  CBG MONITORING, ED - Abnormal; Notable for the following components:   Glucose-Capillary 114 (*)    All other components within normal limits  APTT  CBC  DIFFERENTIAL  ETHANOL  MAGNESIUM    TSH  CORTISOL    EKG EKG Interpretation Date/Time:  Tuesday March 02 2024 16:35:30 EDT Ventricular Rate:  61 PR Interval:  179 QRS Duration:  110 QT Interval:  415 QTC Calculation: 418 R Axis:   15  Text Interpretation: Sinus rhythm RSR' in V1 or V2, right VCD or RVH Nonspecific T abnormalities, anterior leads No significant change since last tracing Confirmed by Sueellen Emery [phone]) on 03/02/2024 5:20:59 PM  Radiology CT HEAD CODE STROKE WO CONTRAST Result Date: 03/02/2024 CLINICAL DATA:  Code stroke. Neuro deficit, acute, stroke suspected. Right-sided facial droop. Right-sided weakness. EXAM: CT VENOGRAM HEAD TECHNIQUE: Venographic phase images of the brain were obtained following the administration of intravenous contrast. Multiplanar reformats and maximum intensity projections were generated. RADIATION DOSE REDUCTION: This exam was performed according to the departmental dose-optimization program which includes automated exposure control, adjustment of the mA and/or kV according to patient size and/or use of iterative reconstruction technique. CONTRAST:  75mL OMNIPAQUE  IOHEXOL  350 MG/ML SOLN COMPARISON:  Prior head CT examinations 02/02/2024 and earlier. Brain MRI 02/01/2024. CT angiogram head/neck 02/01/2024. CT venogram head 02/01/2024. FINDINGS: NON-CONTRAST HEAD CT FINDINGS: Brain: No age-advanced or lobar predominant cerebral atrophy. 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 Known subacute infarcts elsewhere within the bilateral medial frontal lobes, occult by CT and better appreciated on the prior brain MRI of 02/01/2024. Partially empty sella turcica. 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 There is no acute intracranial hemorrhage. No extra-axial fluid collection. No  evidence of an intracranial mass. No midline shift. Vascular: No hyperdense arterial vessel. Venous findings reported below. Skull: No calvarial fracture or aggressive osseous lesion. Sinuses/Orbits: No mass or acute finding within the imaged orbits. No significant paranasal sinus disease at the imaged levels. CT VENOGRAM HEAD FINDINGS: Residual/recurrent occlusive and subocclusive thrombi within the superior sagittal sinus posteriorly, extending to the confluence of sinuses. Thrombus extends into the medial aspect of the right transverse dural venous sinus. New from the CT venogram head of 02/01/2024, there is some reconstitution of enhancement within the right transverse dural venous sinus more  laterally. Clot burden within the right sigmoid sinus has also decreased. Residual/recurrent subocclusive thrombus within the left transverse and sigmoid dural venous sinuses. 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 The straight sinus, inferior sagittal sinus and vein of Galen are patent. The internal cerebral veins are patent. (Series 4, image 128). Extensive venous collaterals again noted. These results were called by telephone at the time of interpretation on 03/02/2024 at 4:59 pm to provider Dr. Doretta Gant, who verbally acknowledged these results. IMPRESSION: Non-contrast head CT: 1. 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 Consider a brain MRI to assess for acute components. 2. Known subacute infarcts elsewhere within the medial frontal lobes bilaterally, occult by CT and better appreciated on the prior brain MRI of 02/01/2024. 3. Partially empty sella turcica. This finding can reflect incidental anatomic variation, or alternatively, it can be associated with chronic idiopathic intracranial hypertension (pseudotumor cerebri). 4. 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 CT venogram  head: 1. Residual/recurrent occlusive and subocclusive thrombi within the superior sagittal sinus posteriorly, extending to the confluence of sinuses. 2. Thrombus extends into the medial aspect of the right transverse dural venous sinus. 3. New from the CT venogram head of 02/01/2024, there is some reconstitution of enhancement within the right transverse dural venous sinus more laterally. Clot burden within the right sigmoid sinus has also decreased. 4. Residual/recurrent subocclusive thrombus within the left transverse and sigmoid dural venous sinuses. 5. 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 6. The straight sinus, inferior sagittal sinus and vein of Galen are now patent. 7. Extensive venous collaterals again noted. Electronically Signed   By: Bascom Lily D.O.   On: 03/02/2024 17:00   CT VENOGRAM HEAD Result Date: 03/02/2024 CLINICAL DATA:  Code stroke. Neuro deficit, acute, stroke suspected. Right-sided facial droop. Right-sided weakness. EXAM: CT VENOGRAM HEAD TECHNIQUE: Venographic phase images of the brain were obtained following the administration of intravenous contrast. Multiplanar reformats and maximum intensity projections were generated. RADIATION DOSE REDUCTION: This exam was performed according to the departmental dose-optimization program which includes automated exposure control, adjustment of the mA and/or kV according to patient size and/or use of iterative reconstruction technique. CONTRAST:  75mL OMNIPAQUE  IOHEXOL  350 MG/ML SOLN COMPARISON:  Prior head CT examinations 02/02/2024 and earlier. Brain MRI 02/01/2024. CT angiogram head/neck 02/01/2024. CT venogram head 02/01/2024. FINDINGS: NON-CONTRAST HEAD CT FINDINGS: Brain: No age-advanced or lobar predominant cerebral atrophy. 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 Known subacute  infarcts elsewhere within the bilateral medial frontal lobes, occult by CT and better appreciated on the prior brain MRI of 02/01/2024. Partially empty sella turcica. 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 There is no acute intracranial hemorrhage. No extra-axial fluid collection. No evidence of an intracranial mass. No midline shift. Vascular: No hyperdense arterial vessel. Venous findings reported below. Skull: No calvarial fracture or aggressive osseous lesion. Sinuses/Orbits: No mass or acute finding within the imaged orbits. No significant paranasal sinus disease at the imaged levels. CT VENOGRAM HEAD FINDINGS: Residual/recurrent occlusive and subocclusive thrombi within the superior sagittal sinus posteriorly, extending to the confluence of sinuses. Thrombus extends into the medial aspect of the right transverse dural venous sinus. New from the CT venogram head  of 02/01/2024, there is some reconstitution of enhancement within the right transverse dural venous sinus more laterally. Clot burden within the right sigmoid sinus has also decreased. Residual/recurrent subocclusive thrombus within the left transverse and sigmoid dural venous sinuses. 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 The straight sinus, inferior sagittal sinus and vein of Galen are patent. The internal cerebral veins are patent. (Series 4, image 128). Extensive venous collaterals again noted. These results were called by telephone at the time of interpretation on 03/02/2024 at 4:59 pm to provider Dr. Doretta Gant, who verbally acknowledged these results. IMPRESSION: Non-contrast head CT: 1. 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 Consider a brain MRI to assess for acute components. 2. Known subacute infarcts elsewhere within the medial frontal lobes bilaterally,  occult by CT and better appreciated on the prior brain MRI of 02/01/2024. 3. Partially empty sella turcica. This finding can reflect incidental anatomic variation, or alternatively, it can be associated with chronic idiopathic intracranial hypertension (pseudotumor cerebri). 4. 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 CT venogram head: 1. Residual/recurrent occlusive and subocclusive thrombi within the superior sagittal sinus posteriorly, extending to the confluence of sinuses. 2. Thrombus extends into the medial aspect of the right transverse dural venous sinus. 3. New from the CT venogram head of 02/01/2024, there is some reconstitution of enhancement within the right transverse dural venous sinus more laterally. Clot burden within the right sigmoid sinus has also decreased. 4. Residual/recurrent subocclusive thrombus within the left transverse and sigmoid dural venous sinuses. 5. 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 6. The straight sinus, inferior sagittal sinus and vein of Galen are now patent. 7. Extensive venous collaterals again noted. Electronically Signed   By: Bascom Lily D.O.   On: 03/02/2024 17:00    Procedures Procedures    Medications Ordered in ED Medications  0.9 %  sodium chloride  infusion ( Intravenous New Bag/Given 03/02/24 1815)  0.9 %  sodium chloride  infusion (has no administration in time range)  potassium chloride  SA (KLOR-CON  M) CR tablet 40 mEq (has no administration in time range)  apixaban  (ELIQUIS ) tablet 5 mg (has no administration in time range)  atorvastatin  (LIPITOR ) tablet 80 mg (has no administration in time range)  pantoprazole  (PROTONIX ) EC tablet 40 mg (has no administration in time range)   stroke: early stages of recovery book (has no administration in time range)  acetaminophen  (TYLENOL ) tablet 650 mg (has no administration in time range)    Or  acetaminophen  (TYLENOL ) 160 MG/5ML solution 650 mg (has no  administration in time range)    Or  acetaminophen  (TYLENOL ) suppository 650 mg (has no administration in time range)  ondansetron  (ZOFRAN ) injection 4 mg (has no administration in time range)  iohexol  (OMNIPAQUE ) 350 MG/ML injection 75 mL (75 mLs Intravenous Contrast Given 03/02/24 1628)  sodium chloride  0.9 % bolus 1,000 mL (0 mLs Intravenous Stopped 03/02/24 1811)    ED Course/ Medical Decision Making/ A&P                                 Medical Decision Making Amount and/or Complexity of Data Reviewed Labs: ordered. Radiology: ordered.  Risk Prescription drug management. Decision regarding hospitalization.   This patient presents to the ED for concern of cva, this involves an extensive number of treatment options, and is a complaint that carries with  it a high risk of complications and morbidity.  The differential diagnosis includes cva, thrombosis, dehydration   Co morbidities that complicate the patient evaluation  Deafness, encephalitis, venous thrombosis   Additional history obtained:  Additional history obtained from epic chart review External records from outside source obtained and reviewed including EMS report   Lab Tests:  I Ordered, and personally interpreted labs.  The pertinent results include:  cbc nl, etoh neg, cmp nl,    Imaging Studies ordered:  I ordered imaging studies including ct head, ct venogram, mri  I independently visualized and interpreted imaging which showed  CT head/venogram:  IMPRESSION:  Non-contrast head CT:    1. A known venous infarct within the medial left frontal lobe has  increased in extent since the head CT of 02/02/2024, now spanning  2.6 cm. Consider a brain MRI to assess for acute components.  2. Known subacute infarcts elsewhere within the medial frontal lobes  bilaterally, occult by CT and better appreciated on the prior brain  MRI of 02/01/2024.  3. Partially empty sella turcica. This finding can reflect   incidental anatomic variation, or alternatively, it can be  associated with chronic idiopathic intracranial hypertension  (pseudotumor cerebri).  4. 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    CT venogram head:    1. Residual/recurrent occlusive and subocclusive thrombi within the  superior sagittal sinus posteriorly, extending to the confluence of  sinuses.  2. Thrombus extends into the medial aspect of the right transverse  dural venous sinus.  3. New from the CT venogram head of 02/01/2024, there is some  reconstitution of enhancement within the right transverse dural  venous sinus more laterally. Clot burden within the right sigmoid  sinus has also decreased.  4. Residual/recurrent subocclusive thrombus within the left  transverse and sigmoid dural venous sinuses.  5. 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  6. The straight sinus, inferior sagittal sinus and vein of Galen are  now patent.  7. Extensive venous collaterals again noted.    I agree with the radiologist interpretation   Cardiac Monitoring:  The patient was maintained on a cardiac monitor.  I personally viewed and interpreted the cardiac monitored which showed an underlying rhythm of: nsr   Medicines ordered and prescription drug management:  I ordered medication including ivfs  for sx  Reevaluation of the patient after these medicines showed that the patient improved I have reviewed the patients home medicines and have made adjustments as needed   Test Considered:  Ct/mri   Critical Interventions:  Code stroke   Consultations Obtained:  I requested consultation with the neurologist (Dr. Doretta Gant),  and discussed lab and imaging findings as well as pertinent plan - she recommends admission for iv hydration.  She also recommends MRI w/wo. Pt d/w Dr. Michaelene Admire (triad ) for admission   Problem List / ED Course:  Intermittent right sided weakness:  likely  related to dehydration and thrombi.  Neuro recommends admission for hydration and MRIs.   Reevaluation:  After the interventions noted above, I reevaluated the patient and found that they have :improved   Social Determinants of Health:  Lives at home, deaf, Sherline Distel   Dispostion:  After consideration of the diagnostic results and the patients response to treatment, I feel that the patent would benefit from admission.    CRITICAL CARE Performed by: Sueellen Emery   Total critical care time: 30 minutes  Critical care time was exclusive of separately billable procedures and treating  other patients.  Critical care was necessary to treat or prevent imminent or life-threatening deterioration.  Critical care was time spent personally by me on the following activities: development of treatment plan with patient and/or surrogate as well as nursing, discussions with consultants, evaluation of patient's response to treatment, examination of patient, obtaining history from patient or surrogate, ordering and performing treatments and interventions, ordering and review of laboratory studies, ordering and review of radiographic studies, pulse oximetry and re-evaluation of patient's condition.        Final Clinical Impression(s) / ED Diagnoses Final diagnoses:  Dehydration  Venous thrombosis  Language barrier    Rx / DC Orders ED Discharge Orders     None         Sueellen Emery, MD 03/02/24 [phone]

## 2024-03-02 NOTE — Progress Notes (Signed)
 Subjective:    Patient ID: Samuel Warren, male    DOB: 11-18-1990, 33 y.o.   MRN: [id number]  HPI  Patient arrived with his brother sign language interpreter for establish care visit. While in the waiting room he had sudden onset right sided facial weakness/droop, right sided upper extremity and lower extremity weakness. According to sign language interpreter prior to weakness presenting he was able to raise his right arm above his head. Last known normal was approx 2:44 pm. In the room he had marked weakness in his right upper extremity and lower extremity with decreased grip strength in his right hand.  He was not able to raise his right arm. His brother reported that he had not missed any doses of Eliquis .   Prior to this he was seen in the emergency room had a hospital admission on 01/25/2024 through 01/29/2024 with meningitis from suspected wild boar hunting and treated with doxycycline    Then returned the emergency room 3 days later complaining of headache, nausea, vomiting, and fatigue.   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 Code Stroke CT head multiple thrombosed cerebral veins including the SSS, straight sinus, inferior sagittal sinus and both transverse sinuses left worse than right. No acute hypodensity is seen  CTA head & neck  Diffuse dural sinus thrombosis  CTV Extensive dural sinus thrombus within the posterior aspect of the superior sagittal sinus, the straight sinus, the inferior sagittal sinus and left greater than right transverse sinus. Thrombus extends into the left sigmoid sinus and visualized left internal jugular vein. Innumerable enlarged and tortuous cortical veins are visualized, consistent with collateral venous drainage. Enlarged left petrosal veins are present in the middle cranial fossa Repeat CT head Diffuse hyperdensity throughout the major dural sinuses.  ssociated cerebral edema with diffuse loss of cortical sulcation and 7 mm of  cerebellar tonsillar ectopia, stable. 2D Echo 3/18 EF 45 to 50%. Apical hypokinesis with overall mild LV dysfunction  LDL 279 HgbA1c 5.7   No antithrombotic prior to admission, now on Eliquis . 3/24 S/p revascularization of left internal jugular, left sigmoid sinus and left transverse sinus. Superior sagittal sinus remained occluded.  Neuro and neuro IR followed.  Seen by PT OT who recommends CIR however while he was waiting for CIR admission, he continued to work with PT OT, in the interim, he lost his insurance and he improved and PT recommended discharging home with outpatient neurorehab..  Patient is now cleared by neurology for discharge to continue Eliquis  for 6 to 9 months.  He was started on Eliquis  10 mg p.o. twice daily for 7 days and tonight will be his last dose that he will get prior to discharge and then starting tomorrow, he will be on Eliquis  5 mg p.o. twice daily.     Review of Systems See HPI   Past Medical History:  Diagnosis Date   Deaf     Social History   Socioeconomic History   Marital status: Single    Spouse name: Not on file   Number of children: Not on file   Years of education: Not on file   Highest education level: Not on file  Occupational History   Not on file  Tobacco Use   Smoking status: Never   Smokeless tobacco: Never  Substance and Sexual Activity   Alcohol use: Not Currently   Drug use: Not Currently   Sexual activity: Not on file  Other Topics Concern   Not on file  Social History Narrative   **  Merged History Encounter **       Social Drivers of Corporate investment banker Strain: Not on file  Food Insecurity: No Food Insecurity (02/02/2024)   Hunger Vital Sign    Worried About Running Out of Food in the Last Year: Never true    Ran Out of Food in the Last Year: Never true  Transportation Needs: No Transportation Needs (02/02/2024)   PRAPARE - Administrator, Civil Service (Medical): No    Lack of Transportation  (Non-Medical): No  Physical Activity: Not on file  Stress: Not on file  Social Connections: Patient Declined (02/08/2024)   Social Connection and Isolation Panel [NHANES]    Frequency of Communication with Friends and Family: Patient declined    Frequency of Social Gatherings with Friends and Family: Patient declined    Attends Religious Services: Patient declined    Active Member of Clubs or Organizations: Patient declined    Attends Banker Meetings: Patient declined    Marital Status: Patient declined  Intimate Partner Violence: Patient Unable To Answer (02/02/2024)   Humiliation, Afraid, Rape, and Kick questionnaire    Fear of Current or Ex-Partner: Patient unable to answer    Emotionally Abused: Patient unable to answer    Physically Abused: Patient unable to answer    Sexually Abused: Patient unable to answer    Past Surgical History:  Procedure Laterality Date   IR ANGIO INTRA EXTRACRAN SEL COM CAROTID INNOMINATE BILAT MOD SED  02/02/2024   IR THROMBECT VENO MECH MOD SED  02/02/2024   IR VENO/JUGULAR LEFT  02/02/2024   RADIOLOGY WITH ANESTHESIA N/A 02/02/2024   Procedure: RADIOLOGY WITH ANESTHESIA;  Surgeon: Luellen Sages, MD;  Location: [facility] OR;  Service: Radiology;  Laterality: N/A;    No family history on file.  No Known Allergies  Current Outpatient Medications on File Prior to Visit  Medication Sig Dispense Refill   apixaban  (ELIQUIS ) 5 MG TABS tablet Take 1 tablet (5 mg total) by mouth 2 (two) times daily. 60 tablet 0   atorvastatin  (LIPITOR ) 80 MG tablet Take 1 tablet (80 mg total) by mouth daily. 30 tablet 0   ondansetron  (ZOFRAN ) 4 MG tablet Take 1 tablet (4 mg total) by mouth every 6 (six) hours as needed for nausea. 20 tablet 0   No current facility-administered medications on file prior to visit.    BP 110/70   Pulse 72   Temp 98.6 F (37 C)   SpO2 96%       Objective:   Physical Exam Vitals and nursing note reviewed.  Constitutional:       Appearance: Normal appearance.  Eyes:     Extraocular Movements: Extraocular movements intact.     Conjunctiva/sclera: Conjunctivae normal.     Pupils: Pupils are equal, round, and reactive to light.  Cardiovascular:     Rate and Rhythm: Normal rate and regular rhythm.     Pulses: Normal pulses.     Heart sounds: Normal heart sounds.  Pulmonary:     Effort: Pulmonary effort is normal.     Breath sounds: Normal breath sounds.  Skin:    General: Skin is warm and dry.  Neurological:     Mental Status: He is alert.     Cranial Nerves: Cranial nerve deficit and facial asymmetry present.     Motor: No weakness.     Comments: Right sided facial droop. No tongue deviation.  2/5 right sided grip strength  5/5 grip strength  on left 1/5 RUE strength against resistance 2/5 RLE strength against resistance.        Assessment & Plan:  1. Right sided weakness (Primary) -Due to symptoms and recent health history, EMS was called for possible stroke.  He was taken to local emergency room for further evaluation. 2. Acute cerebral venous sinus thrombosis   3. Dural venous sinus thrombosis  Alto Atta, NP

## 2024-03-02 NOTE — ED Notes (Signed)
 Report given to receiving RN.

## 2024-03-02 NOTE — ED Notes (Signed)
 Unable to complete some parts of stroke screen because PT is mute.Will complete when a family member arrives that can help communicate.PT also speaks a different language.

## 2024-03-03 DIAGNOSIS — R5381 Other malaise: Secondary | ICD-10-CM | POA: Diagnosis present

## 2024-03-03 DIAGNOSIS — Y900 Blood alcohol level of less than 20 mg/100 ml: Secondary | ICD-10-CM | POA: Diagnosis present

## 2024-03-03 DIAGNOSIS — I676 Nonpyogenic thrombosis of intracranial venous system: Secondary | ICD-10-CM

## 2024-03-03 DIAGNOSIS — G510 Bell's palsy: Secondary | ICD-10-CM | POA: Diagnosis present

## 2024-03-03 DIAGNOSIS — E0781 Sick-euthyroid syndrome: Secondary | ICD-10-CM | POA: Diagnosis present

## 2024-03-03 DIAGNOSIS — I69391 Dysphagia following cerebral infarction: Secondary | ICD-10-CM

## 2024-03-03 DIAGNOSIS — E785 Hyperlipidemia, unspecified: Secondary | ICD-10-CM | POA: Diagnosis present

## 2024-03-03 DIAGNOSIS — R1319 Other dysphagia: Secondary | ICD-10-CM

## 2024-03-03 DIAGNOSIS — E860 Dehydration: Secondary | ICD-10-CM | POA: Diagnosis present

## 2024-03-03 DIAGNOSIS — Z79899 Other long term (current) drug therapy: Secondary | ICD-10-CM | POA: Diagnosis not present

## 2024-03-03 DIAGNOSIS — K219 Gastro-esophageal reflux disease without esophagitis: Secondary | ICD-10-CM | POA: Diagnosis present

## 2024-03-03 DIAGNOSIS — H913 Deaf nonspeaking, not elsewhere classified: Secondary | ICD-10-CM | POA: Diagnosis present

## 2024-03-03 DIAGNOSIS — Z7901 Long term (current) use of anticoagulants: Secondary | ICD-10-CM | POA: Diagnosis not present

## 2024-03-03 DIAGNOSIS — R531 Weakness: Secondary | ICD-10-CM | POA: Diagnosis not present

## 2024-03-03 DIAGNOSIS — F1090 Alcohol use, unspecified, uncomplicated: Secondary | ICD-10-CM | POA: Diagnosis present

## 2024-03-03 DIAGNOSIS — Z7141 Alcohol abuse counseling and surveillance of alcoholic: Secondary | ICD-10-CM | POA: Diagnosis not present

## 2024-03-03 DIAGNOSIS — G08 Intracranial and intraspinal phlebitis and thrombophlebitis: Secondary | ICD-10-CM | POA: Diagnosis present

## 2024-03-03 DIAGNOSIS — G8191 Hemiplegia, unspecified affecting right dominant side: Secondary | ICD-10-CM | POA: Diagnosis present

## 2024-03-03 DIAGNOSIS — E876 Hypokalemia: Secondary | ICD-10-CM | POA: Diagnosis present

## 2024-03-03 DIAGNOSIS — Z8661 Personal history of infections of the central nervous system: Secondary | ICD-10-CM | POA: Diagnosis not present

## 2024-03-03 LAB — BASIC METABOLIC PANEL WITH GFR
Anion gap: 6 (ref 5–15)
BUN: <5 mg/dL — ABNORMAL LOW (ref 6–20)
CO2: 14 mmol/L — ABNORMAL LOW (ref 22–32)
Calcium: 6.7 mg/dL — ABNORMAL LOW (ref 8.9–10.3)
Chloride: 125 mmol/L — ABNORMAL HIGH (ref 98–111)
Creatinine, Ser: 0.66 mg/dL (ref 0.61–1.24)
GFR, Estimated: >60 mL/min (ref >60)
Glucose, Bld: 95 mg/dL (ref 70–99)
Potassium: 3.6 mmol/L (ref 3.5–5.1)
Sodium: 145 mmol/L (ref 135–145)

## 2024-03-03 LAB — MAGNESIUM: Magnesium: 1.4 mg/dL — ABNORMAL LOW (ref 1.7–2.4)

## 2024-03-03 LAB — T4, FREE: Free T4: 0.8 ng/dL (ref 0.61–1.12)

## 2024-03-03 LAB — CORTISOL: Cortisol, Plasma: 16.9 ug/dL

## 2024-03-03 LAB — TSH: TSH: 0.317 u[IU]/mL — ABNORMAL LOW (ref 0.350–4.500)

## 2024-03-03 MED ORDER — MAGNESIUM SULFATE 2 GM/50ML IV SOLN
2.0000 g | Freq: Once | INTRAVENOUS | Status: AC
  Administered 2024-03-03: 2 g via INTRAVENOUS
  Filled 2024-03-03: qty 50

## 2024-03-03 NOTE — Evaluation (Signed)
 Clinical/Bedside Swallow Evaluation Patient Details  Name: Samuel Warren MRN: [id number] Date of Birth: 1990-12-22  Today's Date: 03/03/2024 Time: SLP Start Time (ACUTE ONLY): 1113 SLP Stop Time (ACUTE ONLY): 1121 SLP Time Calculation (min) (ACUTE ONLY): 8 min  Past Medical History:  Past Medical History:  Diagnosis Date   Deaf    Past Surgical History:  Past Surgical History:  Procedure Laterality Date   IR ANGIO INTRA EXTRACRAN SEL COM CAROTID INNOMINATE BILAT MOD SED  02/02/2024   IR THROMBECT VENO MECH MOD SED  02/02/2024   IR VENO/JUGULAR LEFT  02/02/2024   RADIOLOGY WITH ANESTHESIA N/A 02/02/2024   Procedure: RADIOLOGY WITH ANESTHESIA;  Surgeon: Luellen Sages, MD;  Location: [facility] OR;  Service: Radiology;  Laterality: N/A;   HPI:  Samuel Warren is a 33 y.o. [place] male who is deaf at baseline with no formal language system.  He communicates largely through gesture.  Brought in by EMS from PCP office secondary to right-sided weakness.  Apparently symptoms present especially when standing or sitting up, partially resolving when laying down. MRI 4/22: "1. Small areas of abnormal diffusion weighted intensity within the  high bilateral paramedian frontal lobes, similar to findings from  02/01/2024, consistent with subacute venous ischemia.  2. Widespread mixed occlusive and nonocclusive dural venous sinus  thrombosis is better characterized on the concomitant CT venogram."  Pt with medical history significant of history of stroke/extensive venous thrombosis, prior history of meningoencephalitis, hyperlipidemia and GERD.    Assessment / Plan / Recommendation  Clinical Impression  Pt presents with functional swallowing as assessed clinically.  Pt communicates largely by gesture.  He was able to follow directions for OME which SLP was able to demonstrate with no focal deficits noted.  Pt tolerated thin liquid by cup.  He declined puree and regular solids.  Pt appeared to indicate that his  throat hurt.  There was facial grimacing and pt gestured to neck.  With encouragement, he took one bite of regular solid cracker.  Pt exhibited adequate oral clearance and no clinical s/s of aspiration.  Pt appears safe to continue current diet.  Pt has no further ST needs.  SLP will sign off.    Recommend regular texture solids with thin liquid.   SLP Visit Diagnosis: Dysphagia, unspecified (R13.10)    Aspiration Risk  No limitations    Diet Recommendation Regular;Thin liquid    Liquid Administration via: Cup;Straw Medication Administration: Whole meds with liquid Supervision: Patient able to self feed Compensations: Slow rate;Small sips/bites    Other  Recommendations Oral Care Recommendations: Oral care BID    Recommendations for follow up therapy are one component of a multi-disciplinary discharge planning process, led by the attending physician.  Recommendations may be updated based on patient status, additional functional criteria and insurance authorization.  Follow up Recommendations No SLP follow up      Assistance Recommended at Discharge  N/A  Functional Status Assessment Patient has not had a recent decline in their functional status  Frequency and Duration  (N/A)          Prognosis Prognosis for improved oropharyngeal function:  (N/A)      Swallow Study   General HPI: Samuel Warren is a 33 y.o. [place] male who is deaf at baseline with no formal language system.  He communicates largely through gesture.  Brought in by EMS from PCP office secondary to right-sided weakness.  Apparently symptoms present especially when standing or sitting up, partially resolving when laying down. MRI 4/22: "  1. Small areas of abnormal diffusion weighted intensity within the  high bilateral paramedian frontal lobes, similar to findings from  02/01/2024, consistent with subacute venous ischemia.  2. Widespread mixed occlusive and nonocclusive dural venous sinus  thrombosis is better  characterized on the concomitant CT venogram."  Pt with medical history significant of history of stroke/extensive venous thrombosis, prior history of meningoencephalitis, hyperlipidemia and GERD. Type of Study: Bedside Swallow Evaluation Previous Swallow Assessment: None, SLE 01/28/24 Diet Prior to this Study: Regular;Thin liquids (Level 0) Temperature Spikes Noted: No Respiratory Status: Room air History of Recent Intubation: No Behavior/Cognition: Alert;Cooperative;Pleasant mood Oral Cavity Assessment: Within Functional Limits Oral Care Completed by SLP: No Oral Cavity - Dentition: Adequate natural dentition Vision: Functional for self-feeding Self-Feeding Abilities: Able to feed self Patient Positioning: Upright in bed Volitional Cough:  (No observed) Volitional Swallow:  (Not observed)    Oral/Motor/Sensory Function Overall Oral Motor/Sensory Function: Within functional limits   Ice Chips Ice chips: Not tested   Thin Liquid Thin Liquid: Within functional limits    Nectar Thick Nectar Thick Liquid: Not tested   Honey Thick Honey Thick Liquid: Not tested   Puree Puree: Not tested   Solid     Solid: Within functional limits Presentation: Self Fed      Elester Grim, MA, CCC-SLP Acute Rehabilitation Services Office: [phone] 03/03/2024,11:35 AM

## 2024-03-03 NOTE — Evaluation (Signed)
 Physical Therapy Evaluation Patient Details Name: Samuel Warren MRN: [id number] DOB: Mar 21, 1991 Today's Date: 03/03/2024  History of Present Illness  Samuel Warren is a 33 y.o. male admitted 03/02/24 from PCP office secondary to right-sided weakness.  Apparently symptoms present especially when standing or sitting up, partially resolving when laying down. MRI found Small areas of abnormal diffusion weighted intensity within the high bilateral paramedian frontal lobes, similar to findings from 02/01/2024, consistent with subacute venous ischemia. 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  Clinical Impression  Pt is presenting below baseline level of functioning. Prior to hospitalization pt was Mod I at the end of last hospitalization. Currently pt is CGA for bed mobility and sit to stand. Pt was unable to progress gait due to dizziness/vomiting. Due to pt current functional status, home set up and available assistance at home recommending skilled physical therapy services 3x/week in order to address strength, balance and functional mobility to decrease risk for falls, injury and re-hospitalization.           If plan is discharge home, recommend the following: Assist for transportation;Help with stairs or ramp for entrance;Assistance with cooking/housework;A little help with walking and/or transfers     Equipment Recommendations None recommended by PT     Functional Status Assessment Patient has had a recent decline in their functional status and demonstrates the ability to make significant improvements in function in a reasonable and predictable amount of time.     Precautions / Restrictions Precautions Precautions: Fall Recall of Precautions/Restrictions: Intact Precaution/Restrictions Comments: unable to fully assess due to communication  barriers Restrictions Weight Bearing Restrictions Per Provider Order: No      Mobility  Bed Mobility Overal bed mobility: Needs Assistance Bed Mobility: Supine to Sit, Sit to Supine     Supine to sit: Contact guard Sit to supine: Contact guard assist   General bed mobility comments: pt able to rise from supine and return to supine without assist; CGA for safety    Transfers Overall transfer level: Needs assistance Equipment used:  (Chair in front of patient) Transfers: Sit to/from Stand Sit to Stand: Contact guard assist, +2 safety/equipment           General transfer comment: Pt able to stand from stretcher with out physical assist. Reports dizziness and began vomiting in standing.    Ambulation/Gait     General Gait Details: Unable to progress gait due to dizziness/vomiting.    Balance Overall balance assessment: Needs assistance Sitting-balance support: No upper extremity supported, Feet unsupported Sitting balance-Leahy Scale: Good     Standing balance support: Bilateral upper extremity supported, Single extremity supported Standing balance-Leahy Scale: Fair Standing balance comment: CGA for safety, using back of chair for support. No noted weakness in the RLE/UE during standing.       Pertinent Vitals/Pain Pain Assessment Faces Pain Scale: Hurts worst Breathing: normal Negative Vocalization: occasional moan/groan, low speech, negative/disapproving quality Facial Expression: facial grimacing Body Language: tense, distressed pacing, fidgeting Consolability: no need to console PAINAD Score: 4 Pain Location: posterior HA Pain Descriptors / Indicators: Grimacing Pain Intervention(s): Limited activity within patient's tolerance, Monitored during session    Home Living Family/patient expects to be discharged to:: Private residence Living Arrangements: Parent;Other relatives Available Help at Discharge: Family;Available 24 hours/day Type of Home:  House Home Access: Stairs to enter Entrance Stairs-Rails: Lawyer of Steps: 7   Home Layout: One level Home Equipment:  None Additional Comments: lives with parents and siblings, 8 people total. 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 Does not drive but father does. HHA per previous note; unsure if he is still working ADLs Comments: independent     Extremity/Trunk Assessment   Upper Extremity Assessment Upper Extremity Assessment: Defer to OT evaluation    Lower Extremity Assessment Lower Extremity Assessment: Overall WFL for tasks assessed    Cervical / Trunk Assessment Cervical / Trunk Assessment: Normal  Communication   Communication Communication: Impaired Factors Affecting Communication: Hearing impaired;Non - English speaking, interpreter not available    Cognition Arousal: Alert Behavior During Therapy: WFL for tasks assessed/performed   PT - Cognitive impairments: Difficult to assess Difficult to assess due to: Hard of hearing/deaf, Non-English speaking     PT - Cognition Comments: pt is deaf and uses minimal ASL. Following commands: Intact Following commands impaired: Follows one step commands with increased time     Cueing Cueing Techniques: Gestural cues, Visual cues     General Comments General comments (skin integrity, edema, etc.): Orthostatics taken per MD request 112/84, sitting 114/93, standing 107/86, back in supin 121/79. Unable to take after 3 min due to vomiting.        Assessment/Plan    PT Assessment Patient needs continued PT services  PT Problem List Decreased activity tolerance;Decreased balance;Decreased mobility;Pain       PT Treatment  Interventions Gait training;Stair training;Functional mobility training;Therapeutic activities;Therapeutic exercise;Balance training;Patient/family education;Neuromuscular re-education    PT Goals (Current goals can be found in the Care Plan section)  Acute Rehab PT Goals PT Goal Formulation: Patient unable to participate in goal setting Time For Goal Achievement: 03/17/24 Potential to Achieve Goals: Good    Frequency Min 2X/week        AM-PAC PT "6 Clicks" Mobility  Outcome Measure Help needed turning from your back to your side while in a flat bed without using bedrails?: A Little Help needed moving from lying on your back to sitting on the side of a flat bed without using bedrails?: A Little Help needed moving to and from a bed to a chair (including a wheelchair)?: A Little Help needed standing up from a chair using your arms (e.g., wheelchair or bedside chair)?: A Little Help needed to walk in hospital room?: A Little Help needed climbing 3-5 steps with a railing? : A Little 6 Click Score: 18    End of Session Equipment Utilized During Treatment: Gait belt Activity Tolerance: Patient tolerated treatment well Patient left: in bed;with call bell/phone within reach Nurse Communication: Mobility status PT Visit Diagnosis: Other abnormalities of gait and mobility (R26.89)    Time: 4098-1191 PT Time Calculation (min) (ACUTE ONLY): 18 min   Charges:   PT Evaluation $PT Eval Low Complexity: 1 Low   PT General Charges $$ ACUTE PT VISIT: 1 Visit         Sloan Duncans, DPT, CLT  Acute Rehabilitation Services Office: [phone] (Secure chat preferred)   Jenice Mitts 03/03/2024, 12:16 PM

## 2024-03-03 NOTE — Evaluation (Signed)
 Occupational Therapy Evaluation Patient Details Name: Samuel Warren MRN: [id number] DOB: 1991/02/18 Today's Date: 03/03/2024   History of Present Illness   Samuel Warren is a 33 y.o. male admitted 03/02/24 from PCP office secondary to right-sided weakness.  Apparently symptoms present especially when standing or sitting up, partially resolving when laying down. MRI found Small areas of abnormal diffusion weighted intensity within the high bilateral paramedian frontal lobes, similar to findings from 02/01/2024, consistent with subacute venous ischemia. 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     Clinical Impressions Pt prior to recent hospitalization was mod I for ADL and mobility, working at [employer]. Pt since last hospitalization (per chart review) had participated in 2 outpatient therapy sessions, and was doing well...but requireing increased assist from his family. This evaluation no family is present, Pt is deaf and does not use ASL - but followed and communicated through simple gestures very appropriately. Pt with equal strength in BUE, able to perform bed mobility and transfers GCA. He was able to engage in grooming tasks in seated position. Upon standing he started vomiting, indicating dizziness and severe headache through gestures. Pt returned supine, and vomiting stopped. During sitting and brief standing (with side steps up bed) no change in function or strength of RUE noted this session. At this time OT will follow acutely and recommend that he return to OP services post-acute.       If plan is discharge home, recommend the following:   A little help with bathing/dressing/bathroom;Assistance with cooking/housework;Assist for transportation;Help with stairs or ramp for entrance;A little help with walking and/or transfers     Functional Status  Assessment   Patient has had a recent decline in their functional status and demonstrates the ability to make significant improvements in function in a reasonable and predictable amount of time.     Equipment Recommendations   None recommended by OT (Pt should have DME necessary)     Recommendations for Other Services   PT consult     Precautions/Restrictions   Precautions Precautions: Fall Recall of Precautions/Restrictions: Intact Precaution/Restrictions Comments: unable to fully assess due to communication barriers Restrictions Weight Bearing Restrictions Per Provider Order: No     Mobility Bed Mobility Overal bed mobility: Needs Assistance Bed Mobility: Supine to Sit, Sit to Supine     Supine to sit: Contact guard Sit to supine: Contact guard assist   General bed mobility comments: pt able to rise from supine and return to supine without assist; CGA for safety    Transfers Overall transfer level: Needs assistance Equipment used:  (Chair in front of patient) Transfers: Sit to/from Stand Sit to Stand: Contact guard assist, +2 safety/equipment           General transfer comment: Pt able to stand from stretcher with out physical assist. Reports dizziness and began vomiting in standing.      Balance Overall balance assessment: Needs assistance Sitting-balance support: No upper extremity supported, Feet unsupported Sitting balance-Leahy Scale: Good     Standing balance support: Bilateral upper extremity supported, Single extremity supported Standing balance-Leahy Scale: Fair Standing balance comment: CGA for safety, using back of chair for support. No noted weakness in the RLE/UE during standing.                           ADL either performed or assessed with clinical judgement   ADL  Overall ADL's : Needs assistance/impaired Eating/Feeding: NPO   Grooming: Wash/dry face;Supervision/safety;Sitting Grooming Details (indicate cue type and  reason): EOB Upper Body Bathing: Supervision/ safety   Lower Body Bathing: Supervison/ safety   Upper Body Dressing : Sitting;Set up   Lower Body Dressing: Sit to/from stand;Supervision/safety Lower Body Dressing Details (indicate cue type and reason): ableto don socks with figure 4 Toilet Transfer: Contact guard assist Toilet Transfer Details (indicate cue type and reason): limited by vomiting Toileting- Clothing Manipulation and Hygiene: Supervision/safety;Sit to/from stand       Functional mobility during ADLs: Contact guard assist;Cueing for safety General ADL Comments: limited by headache and vomiting     Vision   Additional Comments: functionally eye alignment WFL, tracks appropriately, will continue to monitor     Perception         Praxis         Pertinent Vitals/Pain Pain Assessment Pain Assessment: Faces Faces Pain Scale: Hurts worst Pain Location: posterior HA Pain Descriptors / Indicators: Grimacing Pain Intervention(s): Monitored during session, Limited activity within patient's tolerance, Repositioned     Extremity/Trunk Assessment Upper Extremity Assessment Upper Extremity Assessment: Generalized weakness (equal in L and R)   Lower Extremity Assessment Lower Extremity Assessment: Defer to PT evaluation   Cervical / Trunk Assessment Cervical / Trunk Assessment: Normal   Communication Communication Communication: Impaired Factors Affecting Communication: Hearing impaired;Non - English speaking, interpreter not available   Cognition Arousal: Alert Behavior During Therapy: WFL for tasks assessed/performed Cognition: No apparent impairments Difficult to assess due to: Non-English speaking, Hard of hearing/deaf           OT - Cognition Comments: Pt using guestures to communicate. Does not speak ASL, no family present, but Pt able to follow directions and present needs via guestures.                 Following commands: Intact Following  commands impaired: Only follows one step commands consistently     Cueing  General Comments   Cueing Techniques: Gestural cues;Visual cues  Orthostatics taken per MD request 112/84, sitting 114/93, standing 107/86, back in supin 121/79. Unable to take after 3 min due to vomiting.   Exercises     Shoulder Instructions      Home Living Family/patient expects to be discharged to:: Private residence Living Arrangements: Parent;Other relatives Available Help at Discharge: Family;Available 24 hours/day Type of Home: House Home Access: Stairs to enter [employer] of Steps: 7 Entrance Stairs-Rails: Left;Right Home Layout: One level     Bathroom Shower/Tub: Chief Strategy Officer: Standard     Home Equipment: None   Additional Comments: lives with parents and siblings, 8 people total. Brother states since he went home after last admit had to have HHA of 2 - Per previous hospital admission, was working with [employer] and useing RW      Prior Functioning/Environment Prior Level of Function : Independent/Modified Independent;Working/employed             Mobility Comments: works at [employer] in [employer] civil [employer] and mom works there too. Does not drive but father does. HHA per previous note; unsure if he is still working; per OP therapy notes was working with OPPT/OT ADLs Comments: independent    OT Problem List: Impaired balance (sitting and/or standing);Decreased safety awareness;Impaired sensation   OT Treatment/Interventions: Self-care/ADL training;Therapeutic exercise;Neuromuscular education;DME and/or AE instruction;Therapeutic activities;Patient/family education;Balance training      OT Goals(Current goals can be found in the care plan section)  Acute Rehab OT Goals Patient Stated Goal: lay back down OT Goal Formulation: With patient Time For Goal Achievement: 03/17/24 Potential to Achieve Goals: Good ADL Goals Pt Will Perform  Grooming: with modified independence;standing Pt Will Perform Upper Body Dressing: with modified independence;sitting Pt Will Perform Lower Body Dressing: with modified independence;sit to/from stand Pt Will Transfer to Toilet: with modified independence;ambulating Pt Will Perform Toileting - Clothing Manipulation and hygiene: with modified independence   OT Frequency:  Min 2X/week    Co-evaluation PT/OT/SLP Co-Evaluation/Treatment: Yes Reason for Co-Treatment: For patient/therapist safety PT goals addressed during session: Mobility/safety with mobility;Balance OT goals addressed during session: ADL's and self-care      AM-PAC OT "6 Clicks" Daily Activity     Outcome Measure Help from another person eating meals?: None (NPO) Help from another person taking care of personal grooming?: A Little Help from another person toileting, which includes using toliet, bedpan, or urinal?: A Little Help from another person bathing (including washing, rinsing, drying)?: A Little Help from another person to put on and taking off regular upper body clothing?: A Little Help from another person to put on and taking off regular lower body clothing?: A Little 6 Click Score: 19   End of Session Equipment Utilized During Treatment: Gait belt;Rolling walker (2 wheels) Nurse Communication: Mobility status;Other (comment) (orthostatics, episode of vomiting)  Activity Tolerance: Other (comment) (Pt motivated by limited by vomiting) Patient left: in bed;with call bell/phone within reach  OT Visit Diagnosis: Unsteadiness on feet (R26.81);Repeated falls (R29.6) Pain - part of body:  (head)                Time: 1610-9604 OT Time Calculation (min): 19 min Charges:  OT General Charges $OT Visit: 1 Visit OT Evaluation $OT Eval Moderate Complexity: 1 Mod  Chales [place] OTR/L Acute Rehabilitation Services Office: [phone]  Quincy Buba 03/03/2024, 1:29 PM

## 2024-03-03 NOTE — Progress Notes (Signed)
 Patient arrived to [phone], brother at bedside. VS stable.

## 2024-03-03 NOTE — Progress Notes (Addendum)
 STROKE TEAM PROGRESS NOTE   INTERIM HISTORY/SUBJECTIVE  No family at the bedside.  He was brought in by EMS for acute onset of right side weakness after sitting in chair for approximately 30 minutes, symptoms improved with patient lying flat   He has been receiving IV normal saline for hydration.  CT venogram shows some improvement with partial recanalization of his extensive cerebral venous sinus thrombosis.  MRI scan shows no acute infarct but evolving subacute recent venous infarcts with expected evolutionary changes  CBC    Component Value Date/Time   WBC 6.7 03/02/2024 1611   RBC 4.88 03/02/2024 1611   HGB 13.6 03/02/2024 1620   HCT 40.0 03/02/2024 1620   PLT 229 03/02/2024 1611   MCV 84.6 03/02/2024 1611   MCH 27.0 03/02/2024 1611   MCHC 32.0 03/02/2024 1611   RDW 12.0 03/02/2024 1611   LYMPHSABS 1.7 03/02/2024 1611   MONOABS 0.5 03/02/2024 1611   EOSABS 0.3 03/02/2024 1611   BASOSABS 0.1 03/02/2024 1611    BMET    Component Value Date/Time   NA 145 03/03/2024 1057   K 3.6 03/03/2024 1057   CL 125 (H) 03/03/2024 1057   CO2 14 (L) 03/03/2024 1057   GLUCOSE 95 03/03/2024 1057   BUN <5 (L) 03/03/2024 1057   CREATININE 0.66 03/03/2024 1057   CALCIUM  6.7 (L) 03/03/2024 1057   GFRNONAA >60 03/03/2024 1057    IMAGING past 24 hours MR Brain W and Wo Contrast Result Date: 03/02/2024 CLINICAL DATA:  Acute neurologic deficit EXAM: MRI HEAD WITHOUT AND WITH CONTRAST TECHNIQUE: Multiplanar, multiecho pulse sequences of the brain and surrounding structures were obtained without and with intravenous contrast. CONTRAST:  6mL GADAVIST  GADOBUTROL  1 MMOL/ML IV SOLN COMPARISON:  02/01/2024 FINDINGS: Brain: There small areas of abnormal diffusion weighted intensity within the high bilateral paramedian frontal lobes. No definite ADC correlate. Location is similar to findings from 02/01/2024. Areas of signal loss on susceptibility weighted imaging likely reflect engorged cortical veins.  There is multifocal hyperintense T2-weighted signal within the white matter. Parenchymal volume and CSF spaces are normal. Vascular: Widespread dural venous sinus thrombosis is better characterized on the concomitant CT venogram. There is mixed occlusive and nonocclusive thrombosis of the superior sagittal sinus, left transverse sinus, left sigmoid sinus and right transverse sinus. 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 Arterial flow voids are normal. Skull and upper cervical spine: Normal calvarium and skull base. Visualized upper cervical spine and soft tissues are normal. Sinuses/Orbits:No paranasal sinus fluid levels or advanced mucosal thickening. No mastoid or middle ear effusion. Normal orbits. IMPRESSION: 1. Small areas of abnormal diffusion weighted intensity within the high bilateral paramedian frontal lobes, similar to findings from 02/01/2024, consistent with subacute venous ischemia. 2. Widespread mixed occlusive and nonocclusive dural venous sinus thrombosis is better characterized on the concomitant CT venogram. Electronically Signed   By: Juanetta Nordmann M.D.   On: 03/02/2024 20:39   CT HEAD CODE STROKE WO CONTRAST Result Date: 03/02/2024 CLINICAL DATA:  Code stroke. Neuro deficit, acute, stroke suspected. Right-sided facial droop. Right-sided weakness. EXAM: CT VENOGRAM HEAD TECHNIQUE: Venographic phase images of the brain were obtained following the administration of intravenous contrast. Multiplanar reformats and maximum intensity projections were generated. RADIATION DOSE REDUCTION: This exam was performed according to the departmental dose-optimization program which includes automated exposure control, adjustment of the mA and/or kV according to patient size and/or use of iterative reconstruction technique. CONTRAST:  75mL OMNIPAQUE  IOHEXOL  350 MG/ML SOLN COMPARISON:  Prior head CT examinations 02/02/2024  and earlier. Brain MRI 02/01/2024. CT angiogram head/neck 02/01/2024. CT venogram  head 02/01/2024. FINDINGS: NON-CONTRAST HEAD CT FINDINGS: Brain: No age-advanced or lobar predominant cerebral atrophy. 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 Known subacute infarcts elsewhere within the bilateral medial frontal lobes, occult by CT and better appreciated on the prior brain MRI of 02/01/2024. Partially empty sella turcica. 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 There is no acute intracranial hemorrhage. No extra-axial fluid collection. No evidence of an intracranial mass. No midline shift. Vascular: No hyperdense arterial vessel. Venous findings reported below. Skull: No calvarial fracture or aggressive osseous lesion. Sinuses/Orbits: No mass or acute finding within the imaged orbits. No significant paranasal sinus disease at the imaged levels. CT VENOGRAM HEAD FINDINGS: Residual/recurrent occlusive and subocclusive thrombi within the superior sagittal sinus posteriorly, extending to the confluence of sinuses. Thrombus extends into the medial aspect of the right transverse dural venous sinus. New from the CT venogram head of 02/01/2024, there is some reconstitution of enhancement within the right transverse dural venous sinus more laterally. Clot burden within the right sigmoid sinus has also decreased. Residual/recurrent subocclusive thrombus within the left transverse and sigmoid dural venous sinuses. 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 The straight sinus, inferior sagittal sinus and vein of Galen are patent. The internal cerebral veins are patent. (Series 4, image 128). Extensive venous collaterals again noted. These results were called by telephone at the time of interpretation on 03/02/2024 at 4:59 pm to provider Dr. Doretta Gant, who verbally acknowledged these  results. IMPRESSION: Non-contrast head CT: 1. 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 Consider a brain MRI to assess for acute components. 2. Known subacute infarcts elsewhere within the medial frontal lobes bilaterally, occult by CT and better appreciated on the prior brain MRI of 02/01/2024. 3. Partially empty sella turcica. This finding can reflect incidental anatomic variation, or alternatively, it can be associated with chronic idiopathic intracranial hypertension (pseudotumor cerebri). 4. 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 CT venogram head: 1. Residual/recurrent occlusive and subocclusive thrombi within the superior sagittal sinus posteriorly, extending to the confluence of sinuses. 2. Thrombus extends into the medial aspect of the right transverse dural venous sinus. 3. New from the CT venogram head of 02/01/2024, there is some reconstitution of enhancement within the right transverse dural venous sinus more laterally. Clot burden within the right sigmoid sinus has also decreased. 4. Residual/recurrent subocclusive thrombus within the left transverse and sigmoid dural venous sinuses. 5. 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 6. The straight sinus, inferior sagittal sinus and vein of Galen are now patent. 7. Extensive venous collaterals again noted. Electronically Signed   By: Bascom Lily D.O.   On: 03/02/2024 17:00   CT VENOGRAM HEAD Result Date: 03/02/2024 CLINICAL DATA:  Code stroke. Neuro deficit, acute, stroke suspected. Right-sided facial droop. Right-sided weakness. EXAM: CT VENOGRAM HEAD TECHNIQUE: Venographic phase images of the brain were obtained following the administration of intravenous contrast. Multiplanar reformats and maximum intensity projections were generated. RADIATION DOSE REDUCTION: This exam was performed according to the departmental  dose-optimization program which includes automated exposure control, adjustment of the mA and/or kV according to patient size and/or use of iterative  reconstruction technique. CONTRAST:  75mL OMNIPAQUE  IOHEXOL  350 MG/ML SOLN COMPARISON:  Prior head CT examinations 02/02/2024 and earlier. Brain MRI 02/01/2024. CT angiogram head/neck 02/01/2024. CT venogram head 02/01/2024. FINDINGS: NON-CONTRAST HEAD CT FINDINGS: Brain: No age-advanced or lobar predominant cerebral atrophy. 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 Known subacute infarcts elsewhere within the bilateral medial frontal lobes, occult by CT and better appreciated on the prior brain MRI of 02/01/2024. Partially empty sella turcica. 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 There is no acute intracranial hemorrhage. No extra-axial fluid collection. No evidence of an intracranial mass. No midline shift. Vascular: No hyperdense arterial vessel. Venous findings reported below. Skull: No calvarial fracture or aggressive osseous lesion. Sinuses/Orbits: No mass or acute finding within the imaged orbits. No significant paranasal sinus disease at the imaged levels. CT VENOGRAM HEAD FINDINGS: Residual/recurrent occlusive and subocclusive thrombi within the superior sagittal sinus posteriorly, extending to the confluence of sinuses. Thrombus extends into the medial aspect of the right transverse dural venous sinus. New from the CT venogram head of 02/01/2024, there is some reconstitution of enhancement within the right transverse dural venous sinus more laterally. Clot burden within the right sigmoid sinus has also decreased. Residual/recurrent subocclusive thrombus within the left transverse and sigmoid dural venous sinuses. Persistent bilateral cavernous  sinus thrombosis suspected. The straight sinus, inferior sagittal sinus and vein of Galen are patent. The internal cerebral veins are patent. (Series 4, image 128). Extensive venous collaterals again noted. These results were called by telephone at the time of interpretation on 03/02/2024 at 4:59 pm to provider Dr. Doretta Gant, who verbally acknowledged these results. IMPRESSION: Non-contrast head CT: 1. 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 Consider a brain MRI to assess for acute components. 2. Known subacute infarcts elsewhere within the medial frontal lobes bilaterally, occult by CT and better appreciated on the prior brain MRI of 02/01/2024. 3. Partially empty sella turcica. This finding can reflect incidental anatomic variation, or alternatively, it can be associated with chronic idiopathic intracranial hypertension (pseudotumor cerebri). 4. 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 CT venogram head: 1. Residual/recurrent occlusive and subocclusive thrombi within the superior sagittal sinus posteriorly, extending to the confluence of sinuses. 2. Thrombus extends into the medial aspect of the right transverse dural venous sinus. 3. New from the CT venogram head of 02/01/2024, there is some reconstitution of enhancement within the right transverse dural venous sinus more laterally. Clot burden within the right sigmoid sinus has also decreased. 4. Residual/recurrent subocclusive thrombus within the left transverse and sigmoid dural venous sinuses. 5. 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 6. The straight sinus, inferior sagittal sinus and vein of Galen are now patent. 7. Extensive venous collaterals again noted. Electronically Signed   By: Bascom Lily D.O.   On: 03/02/2024 17:00    Vitals:   03/03/24 0611 03/03/24 0616 03/03/24 1045 03/03/24 1100  BP:    116/87  Pulse:  65 75 61   Resp: 15 (!) 27  13  Temp: 100.1 F (37.8 C) 98 F (36.7 C)    TempSrc: Oral Oral  Oral  SpO2: 100% 100%  100%  Weight:      Height:  PHYSICAL EXAM General:  Alert, well-nourished, well-developed patient in no acute distress Psych:  Mood and affect appropriate for situation CV: Regular rate and rhythm on monitor Respiratory:  Regular, unlabored respirations on room air GI: Abdomen soft and nontender   NEURO:  Mental Status: awake, alert, deaf and mute.   Cranial Nerves:  II: PERRL. Visual fields full.  III, IV, VI: EOMI. Eyelids elevate symmetrically.  V: Sensation is intact to light touch and symmetrical to face.  VII: Face is symmetrical resting and smiling VIII: hearing intact to voice. IX, X: Palate elevates symmetrically. Phonation is normal.  VW:[id number] shrug 5/5. XII: tongue is midline without fasciculations. Motor: 5/5 strength to all muscle groups tested.  Tone: is normal and bulk is normal Sensation- Intact to light touch bilaterally. Extinction absent to light touch to DSS.   Coordination: FTN intact bilaterally, HKS: no ataxia in BLE.No drift.  Gait- deferred  Most Recent NIH 7   ASSESSMENT/PLAN  [name] is a 33 y.o. 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 NIH on Admission 7  CVST with increased venous infarct, increased symptoms likely secondary to hypotension/dehydration  Code Stroke CT head known venous infarct within the medial left frontal lobe has increased in extent since the head CT of 02/02/2024, now spanning 2.6 cm.  Known subacute infarcts elsewhere within the medial frontal lobes  bilaterally, CTV 1.Residual/recurrent occlusive and subocclusive thrombi within the superior sagittal sinus posteriorly, extending to the confluence of sinuses. 2. Thrombus extends into the medial aspect of the right transverse dural venous sinus. 3. New from the CT venogram head of 02/01/2024, there is some reconstitution of enhancement within the right transverse dural venous sinus more laterally. Clot burden within the right sigmoid sinus has also decreased. 4. Residual/recurrent subocclusive thrombus within the left transverse and sigmoid dural venous sinuses. 5. 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 6. The straight sinus, inferior sagittal sinus and vein of Galen are now patent. 7. Extensive venous collaterals again noted. MRI  Small areas of abnormal diffusion weighted intensity within the high bilateral paramedian frontal lobes, similar to findings from 02/01/2024, consistent with subacute venous ischemia. 2. Widespread mixed occlusive and nonocclusive dural venous sinus 2D Echo 01/27/24 EF 45 to 50%. 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 Small vessel disease in the setting of CNS infection  Completed course of Doxy   Hyperlipidemia Home meds: Atorvastatin  80 mg, resumed in hospital LDL 279, goal < 70 Continue statin at discharge  Dysphagia Patient has post-stroke dysphagia, SLP consulted    Diet   Diet Heart Room service appropriate? Yes; Fluid consistency: Thin   Advance diet as tolerated  Other Stroke Risk Factors ETOH use, alcohol level <15, advised to drink no more than 2 drink(s) a day   Other Active Problems GERD  Hospital day #  0   Jonette Nestle DNP, ACNPC-AG  [facility]  I have personally obtained history,examined this patient, reviewed notes, independently viewed imaging  studies, participated in medical decision making and plan of care.ROS completed by me personally and pertinent positives fully documented  I have made any additions or clarifications directly to the above note. Agree with note above.  Patient well-known to stroke service following recent hospitalization 1 month ago for extensive cerebral venous sinus thrombosis with venous infarcts with relatively preserved neurological exam.  He presented with recurrent transient episodes of right hemiparesis in the upright position which resolved when he would lay flat.  Follow-up MRI scan and CT venogram showed partial recanalization of his extensive cerebral venous sinus thrombosis persistent thrombus.  Continue anticoagulation with Eliquis  and maintain aggressive hydration.  Mobilize out of bed.  Therapy consults.  Observe for another 48 hours and if stable may be discharged back no sign language interpreter available for communication no family at the bedside.  .  Greater than 50% time during this 50-minute visit was spent on counseling and coordination of care about his venous sinus thrombosis and venous infarcts in question and discussion with care team  Ardella Beaver, MD Medical Director [facility] Pager: [phone] 03/03/2024 2:57 PM  To contact Stroke Continuity provider, please refer to [email or web address]. After hours, contact General Neurology

## 2024-03-03 NOTE — Progress Notes (Signed)
 PROGRESS NOTE  Samuel Warren  ZOX:[id number] DOB: May 01, 1991 DOA: 03/02/2024 PCP: Alto Atta, NP   Brief Narrative:  Patient is a 3 male with history of stroke, extensive venous thrombosis, meningoencephalitis, hyperlipidemia, GERD, deaf who was brought from PCP office for the evaluation of right-sided weakness symptoms appear when he stands or sits up but partially resolves when he lies down.  CT head showed no acute findings.  Showed  known venous infarct within the medial left frontal lobe with has increased in extent since the head CT of 02/02/2024, now spanning 2.6 cm, known  subacute infarcts elsewhere within the medial frontal lobes bilaterally.  MRI showed consistent with subacute venous ischemia bilateral paramedian frontal lobes, widespread mixed occlusive and nonocclusive dural venous sinus.  Admitted for stroke workup.  Neurology following   Assessment & Plan:  Principal Problem:   Right sided weakness   Right-sided weakness/chronic cavernous venous thrombosis/history of stroke: CT head showed no acute findings.  Showed  known venous infarct within the medial left frontal lobe which has increased in extent since the head CT of 02/02/2024, now spanning 2.6 cm, known  subacute infarcts elsewhere within the medial frontal lobes bilaterally.  MRI showed consistent with subacute venous ischemia bilateral paramedian frontal lobes, widespread mixed occlusive and nonocclusive dural venous sinus.  Admitted for stroke workup.  Neurology following On Eliquis , statin .There is no need for echo, lipid panel or A1c.  PT/OT/speech evaluation requested.  Suspicious regular diet.  ID recommend outpatient follow-up Normal cortisol.  Orthostatic vitals negative this morning .  Blood pressure soft but stable  Hypokalemia/hypomagnesemia: Supplemented  Abnormal thyroid function test: Slightly low TSH.  Will check free T4  Hyperlipidemia: on statin  GERD: On PPI  Debility/deconditioning: Has  history of stroke.  Patient is deaf.  PT recommend outpatient follow-up        DVT prophylaxis: apixaban  (ELIQUIS ) tablet 5 mg     Code Status: Full Code  Family Communication: None at bedside  Patient status:Obs  Patient is from :Home  Anticipated discharge WJ:XBJY  Estimated DC date:today or tomorrow   Consultants: Neurology  Procedures:None  Antimicrobials:  Anti-infectives (From admission, onward)    None       Subjective: Patient seen and examined at bedside today.  He was comfortable.  Hemodynamically stable.  Vitals stable.  He was sleeping when I arrived.  Woke up on calling his name.  Answers by nodding his head.  Denies any headache.  Moves all extremities freely.  Objective: Vitals:   03/03/24 0400 03/03/24 0500 03/03/24 0611 03/03/24 0616  BP: 100/76 109/76    Pulse: (!) 47 (!) 49 65 75  Resp: 17 18 15  (!) 27  Temp:   100.1 F (37.8 C) 98 F (36.7 C)  TempSrc:   Oral Oral  SpO2: 98% 100% 100% 100%  Weight:      Height:        Intake/Output Summary (Last 24 hours) at 03/03/2024 0829 Last data filed at 03/02/2024 1811 Gross per 24 hour  Intake 1000.15 ml  Output --  Net 1000.15 ml   Filed Weights   03/02/24 1600 03/02/24 1648  Weight: 66.8 kg 66.8 kg    Examination:  General exam: Overall comfortable, not in distress,lying on bed HEENT: PERRL Respiratory system:  no wheezes or crackles  Cardiovascular system: S1 & S2 heard, RRR.  Gastrointestinal system: Abdomen is nondistended, soft and nontender. Central nervous system: Alert and awake,obeys commands Extremities: No edema, no clubbing ,no cyanosis Skin:  No rashes, no ulcers,no icterus     Data Reviewed: I have personally reviewed following labs and imaging studies  CBC: Recent Labs  Lab 03/02/24 1611 03/02/24 1620  WBC 6.7  --   NEUTROABS 4.1  --   HGB 13.2 13.6  HCT 41.3 40.0  MCV 84.6  --   PLT 229  --    Basic Metabolic Panel: Recent Labs  Lab 03/02/24 1611  03/02/24 1620  NA 143 143  K 3.4* 3.4*  CL 110 109  CO2 21*  --   GLUCOSE 104* 104*  BUN 5* 6  CREATININE 1.05 1.00  CALCIUM  9.1  --      No results found for this or any previous visit (from the past 240 hours).   Radiology Studies: MR Brain W and Wo Contrast Result Date: 03/02/2024 CLINICAL DATA:  Acute neurologic deficit EXAM: MRI HEAD WITHOUT AND WITH CONTRAST TECHNIQUE: Multiplanar, multiecho pulse sequences of the brain and surrounding structures were obtained without and with intravenous contrast. CONTRAST:  6mL GADAVIST  GADOBUTROL  1 MMOL/ML IV SOLN COMPARISON:  02/01/2024 FINDINGS: Brain: There small areas of abnormal diffusion weighted intensity within the high bilateral paramedian frontal lobes. No definite ADC correlate. Location is similar to findings from 02/01/2024. Areas of signal loss on susceptibility weighted imaging likely reflect engorged cortical veins. There is multifocal hyperintense T2-weighted signal within the white matter. Parenchymal volume and CSF spaces are normal. Vascular: Widespread dural venous sinus thrombosis is better characterized on the concomitant CT venogram. There is mixed occlusive and nonocclusive thrombosis of the superior sagittal sinus, left transverse sinus, left sigmoid sinus and right transverse sinus. 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 Arterial flow voids are normal. Skull and upper cervical spine: Normal calvarium and skull base. Visualized upper cervical spine and soft tissues are normal. Sinuses/Orbits:No paranasal sinus fluid levels or advanced mucosal thickening. No mastoid or middle ear effusion. Normal orbits. IMPRESSION: 1. Small areas of abnormal diffusion weighted intensity within the high bilateral paramedian frontal lobes, similar to findings from 02/01/2024, consistent with subacute venous ischemia. 2. Widespread mixed occlusive and nonocclusive dural venous sinus thrombosis is better characterized on the concomitant CT  venogram. Electronically Signed   By: Juanetta Nordmann M.D.   On: 03/02/2024 20:39   CT HEAD CODE STROKE WO CONTRAST Result Date: 03/02/2024 CLINICAL DATA:  Code stroke. Neuro deficit, acute, stroke suspected. Right-sided facial droop. Right-sided weakness. EXAM: CT VENOGRAM HEAD TECHNIQUE: Venographic phase images of the brain were obtained following the administration of intravenous contrast. Multiplanar reformats and maximum intensity projections were generated. RADIATION DOSE REDUCTION: This exam was performed according to the departmental dose-optimization program which includes automated exposure control, adjustment of the mA and/or kV according to patient size and/or use of iterative reconstruction technique. CONTRAST:  75mL OMNIPAQUE  IOHEXOL  350 MG/ML SOLN COMPARISON:  Prior head CT examinations 02/02/2024 and earlier. Brain MRI 02/01/2024. CT angiogram head/neck 02/01/2024. CT venogram head 02/01/2024. FINDINGS: NON-CONTRAST HEAD CT FINDINGS: Brain: No age-advanced or lobar predominant cerebral atrophy. 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 Known subacute infarcts elsewhere within the bilateral medial frontal lobes, occult by CT and better appreciated on the prior brain MRI of 02/01/2024. Partially empty sella turcica. 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 There is no acute intracranial hemorrhage. No extra-axial fluid  collection. No evidence of an intracranial mass. No midline shift. Vascular: No hyperdense arterial vessel. Venous findings reported below. Skull: No calvarial fracture or aggressive osseous lesion. Sinuses/Orbits: No mass or acute finding within the imaged orbits. No significant paranasal sinus disease at the imaged levels. CT VENOGRAM HEAD FINDINGS: Residual/recurrent  occlusive and subocclusive thrombi within the superior sagittal sinus posteriorly, extending to the confluence of sinuses. Thrombus extends into the medial aspect of the right transverse dural venous sinus. New from the CT venogram head of 02/01/2024, there is some reconstitution of enhancement within the right transverse dural venous sinus more laterally. Clot burden within the right sigmoid sinus has also decreased. Residual/recurrent subocclusive thrombus within the left transverse and sigmoid dural venous sinuses. 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 The straight sinus, inferior sagittal sinus and vein of Galen are patent. The internal cerebral veins are patent. (Series 4, image 128). Extensive venous collaterals again noted. These results were called by telephone at the time of interpretation on 03/02/2024 at 4:59 pm to provider Dr. Doretta Gant, who verbally acknowledged these results. IMPRESSION: Non-contrast head CT: 1. 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 Consider a brain MRI to assess for acute components. 2. Known subacute infarcts elsewhere within the medial frontal lobes bilaterally, occult by CT and better appreciated on the prior brain MRI of 02/01/2024. 3. Partially empty sella turcica. This finding can reflect incidental anatomic variation, or alternatively, it can be associated with chronic idiopathic intracranial hypertension (pseudotumor cerebri). 4. 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 CT venogram head: 1. Residual/recurrent occlusive and subocclusive thrombi within the superior sagittal sinus posteriorly, extending to the confluence of sinuses. 2. Thrombus extends into the medial aspect of the right transverse dural venous sinus. 3. New from the CT venogram head of 02/01/2024, there is some reconstitution of enhancement within the right transverse  dural venous sinus more laterally. Clot burden within the right sigmoid sinus has also decreased. 4. Residual/recurrent subocclusive thrombus within the left transverse and sigmoid dural venous sinuses. 5. 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 6. The straight sinus, inferior sagittal sinus and vein of Galen are now patent. 7. Extensive venous collaterals again noted. Electronically Signed   By: Bascom Lily D.O.   On: 03/02/2024 17:00   CT VENOGRAM HEAD Result Date: 03/02/2024 CLINICAL DATA:  Code stroke. Neuro deficit, acute, stroke suspected. Right-sided facial droop. Right-sided weakness. EXAM: CT VENOGRAM HEAD TECHNIQUE: Venographic phase images of the brain were obtained following the administration of intravenous contrast. Multiplanar reformats and maximum intensity projections were generated. RADIATION DOSE REDUCTION: This exam was performed according to the departmental dose-optimization program which includes automated exposure control, adjustment of the mA and/or kV according to patient size and/or use of iterative reconstruction technique. CONTRAST:  75mL OMNIPAQUE  IOHEXOL  350 MG/ML SOLN COMPARISON:  Prior head CT examinations 02/02/2024 and earlier. Brain MRI 02/01/2024. CT angiogram head/neck 02/01/2024. CT venogram head 02/01/2024. FINDINGS: NON-CONTRAST HEAD CT FINDINGS: Brain: No age-advanced or lobar predominant cerebral atrophy. 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 Known subacute infarcts elsewhere within the bilateral medial frontal lobes, occult by CT and better appreciated on the prior brain MRI of 02/01/2024. Partially empty sella turcica. Cerebellar tonsillar ectopia with the cerebellar tonsils extending up to 7  mm below the level of foramen magnum. There is no acute intracranial hemorrhage. No extra-axial fluid collection. No  evidence of an intracranial mass. No midline shift. Vascular: No hyperdense arterial vessel. Venous findings reported below. Skull: No calvarial fracture or aggressive osseous lesion. Sinuses/Orbits: No mass or acute finding within the imaged orbits. No significant paranasal sinus disease at the imaged levels. CT VENOGRAM HEAD FINDINGS: Residual/recurrent occlusive and subocclusive thrombi within the superior sagittal sinus posteriorly, extending to the confluence of sinuses. Thrombus extends into the medial aspect of the right transverse dural venous sinus. New from the CT venogram head of 02/01/2024, there is some reconstitution of enhancement within the right transverse dural venous sinus more laterally. Clot burden within the right sigmoid sinus has also decreased. Residual/recurrent subocclusive thrombus within the left transverse and sigmoid dural venous sinuses. 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 The straight sinus, inferior sagittal sinus and vein of Galen are patent. The internal cerebral veins are patent. (Series 4, image 128). Extensive venous collaterals again noted. These results were called by telephone at the time of interpretation on 03/02/2024 at 4:59 pm to provider Dr. Doretta Gant, who verbally acknowledged these results. IMPRESSION: Non-contrast head CT: 1. 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 Consider a brain MRI to assess for acute components. 2. Known subacute infarcts elsewhere within the medial frontal lobes bilaterally, occult by CT and better appreciated on the prior brain MRI of 02/01/2024. 3. Partially empty sella turcica. This finding can reflect incidental anatomic variation, or alternatively, it can be associated with chronic idiopathic intracranial hypertension (pseudotumor cerebri). 4. 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 CT venogram  head: 1. Residual/recurrent occlusive and subocclusive thrombi within the superior sagittal sinus posteriorly, extending to the confluence of sinuses. 2. Thrombus extends into the medial aspect of the right transverse dural venous sinus. 3. New from the CT venogram head of 02/01/2024, there is some reconstitution of enhancement within the right transverse dural venous sinus more laterally. Clot burden within the right sigmoid sinus has also decreased. 4. Residual/recurrent subocclusive thrombus within the left transverse and sigmoid dural venous sinuses. 5. 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 6. The straight sinus, inferior sagittal sinus and vein of Galen are now patent. 7. Extensive venous collaterals again noted. Electronically Signed   By: Bascom Lily D.O.   On: 03/02/2024 17:00    Scheduled Meds:   stroke: early stages of recovery book   Does not apply Once   apixaban   5 mg Oral BID   atorvastatin   80 mg Oral QPM   pantoprazole   40 mg Oral Daily   topiramate   25 mg Oral Daily   Continuous Infusions:  sodium chloride  150 mL/hr at 03/02/24 1815     LOS: 0 days   Leona Rake, MD [facility] P4/23/2025, 8:29 AM

## 2024-03-04 DIAGNOSIS — R1319 Other dysphagia: Secondary | ICD-10-CM | POA: Diagnosis not present

## 2024-03-04 DIAGNOSIS — I69391 Dysphagia following cerebral infarction: Secondary | ICD-10-CM | POA: Diagnosis not present

## 2024-03-04 DIAGNOSIS — E785 Hyperlipidemia, unspecified: Secondary | ICD-10-CM | POA: Diagnosis not present

## 2024-03-04 DIAGNOSIS — I676 Nonpyogenic thrombosis of intracranial venous system: Secondary | ICD-10-CM | POA: Diagnosis not present

## 2024-03-04 DIAGNOSIS — R531 Weakness: Secondary | ICD-10-CM | POA: Diagnosis not present

## 2024-03-04 LAB — BASIC METABOLIC PANEL WITH GFR
Anion gap: 12 (ref 5–15)
BUN: <5 mg/dL — ABNORMAL LOW (ref 6–20)
CO2: 18 mmol/L — ABNORMAL LOW (ref 22–32)
Calcium: 9.2 mg/dL (ref 8.9–10.3)
Chloride: 111 mmol/L (ref 98–111)
Creatinine, Ser: 1.1 mg/dL (ref 0.61–1.24)
GFR, Estimated: >60 mL/min (ref >60)
Glucose, Bld: 103 mg/dL — ABNORMAL HIGH (ref 70–99)
Potassium: 3.5 mmol/L (ref 3.5–5.1)
Sodium: 141 mmol/L (ref 135–145)

## 2024-03-04 LAB — MAGNESIUM: Magnesium: 2.3 mg/dL (ref 1.7–2.4)

## 2024-03-04 MED ORDER — OXYCODONE HCL 5 MG PO TABS
5.0000 mg | ORAL_TABLET | Freq: Once | ORAL | Status: AC
  Administered 2024-03-04: 5 mg via ORAL
  Filled 2024-03-04: qty 1

## 2024-03-04 NOTE — Progress Notes (Signed)
 Physical Therapy Treatment Patient Details Name: Samuel Warren MRN: [id number] DOB: Dec 10, 1990 Today's Date: 03/04/2024   History of Present Illness Samuel Warren is a 33 y.o. male admitted 03/02/24 from PCP office secondary to right-sided weakness.  Apparently symptoms present especially when standing or sitting up, partially resolving when laying down. MRI found Small areas of abnormal diffusion weighted intensity within the high bilateral paramedian frontal lobes, similar to findings from 02/01/2024, consistent with subacute venous ischemia. 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    PT Comments  Pt is Mod I with bed mobility and sit to stand with RW this session. Slight increase in time and use of bed rail with HOB ~20 degrees. Pt is limited due to headache. Orthostatics negative with no increase in weakness noted on the R in standing. Due to pt current functional status, home set up and available assistance at home recommending skilled physical therapy services 3x/week in order to address strength, balance and functional mobility to decrease risk for falls, injury and re-hospitalization.       If plan is discharge home, recommend the following: Assist for transportation;Help with stairs or ramp for entrance;Assistance with cooking/housework     Equipment Recommendations  None recommended by PT       Precautions / Restrictions Precautions Precautions: Fall Recall of Precautions/Restrictions: Intact Precaution/Restrictions Comments: unable to fully assess due to communication barriers Restrictions Weight Bearing Restrictions Per Provider Order: No     Mobility  Bed Mobility Overal bed mobility: Modified Independent Bed Mobility: Supine to Sit     Supine to sit: Modified independent (Device/Increase time), Used rails, HOB elevated     General bed  mobility comments: HOB elevated ~ 20 degrees light use of rails    Transfers Overall transfer level: Modified independent Equipment used: Rolling walker (2 wheels) Transfers: Sit to/from Stand Sit to Stand: Modified independent (Device/Increase time)           General transfer comment: good hand placement. Pt is limited due to dizziness and headache pain. Pt started gesturing to sit after ~ 1 min and grabbing head    Ambulation/Gait   General Gait Details: Unable to progress gait due to headache     Balance Overall balance assessment: Modified Independent, Mild deficits observed, not formally tested Sitting-balance support: No upper extremity supported, Feet unsupported Sitting balance-Leahy Scale: Good     Standing balance support: Bilateral upper extremity supported, Single extremity supported Standing balance-Leahy Scale: Fair Standing balance comment: Mod I with RW        Communication Communication Communication: Impaired Factors Affecting Communication: Hearing impaired;Non - English speaking, interpreter not available  Cognition Arousal: Alert Behavior During Therapy: WFL for tasks assessed/performed   PT - Cognitive impairments: Difficult to assess Difficult to assess due to: Hard of hearing/deaf, Non-English speaking         PT - Cognition Comments: pt is deaf and uses minimal ASL. Following commands: Intact Following commands impaired: Only follows one step commands consistently    Cueing Cueing Techniques: Gestural cues, Visual cues     General Comments General comments (skin integrity, edema, etc.): orthostatics BP supine 108/85, sitting 109/82 standing 107/92. Unable to get 3 min in standing due to headache pain increasing and pt wanting to sit.      Pertinent Vitals/Pain Pain Assessment Pain Assessment: 0-10 Pain Score: 10-Worst pain ever Pain Location: posterior HA Pain Descriptors / Indicators: Grimacing Pain Intervention(s):  Monitored  during session, Limited activity within patient's tolerance     PT Goals (current goals can now be found in the care plan section) Acute Rehab PT Goals PT Goal Formulation: Patient unable to participate in goal setting Time For Goal Achievement: 03/17/24 Potential to Achieve Goals: Good Progress towards PT goals: Progressing toward goals    Frequency    Min 2X/week      PT Plan  Continue with current POC        AM-PAC PT "6 Clicks" Mobility   Outcome Measure  Help needed turning from your back to your side while in a flat bed without using bedrails?: None Help needed moving from lying on your back to sitting on the side of a flat bed without using bedrails?: None Help needed moving to and from a bed to a chair (including a wheelchair)?: None Help needed standing up from a chair using your arms (e.g., wheelchair or bedside chair)?: None Help needed to walk in hospital room?: A Little Help needed climbing 3-5 steps with a railing? : A Little 6 Click Score: 22    End of Session Equipment Utilized During Treatment: Gait belt Activity Tolerance: Patient limited by pain Patient left: in bed;with call bell/phone within reach;with bed alarm set Nurse Communication: Mobility status;Other (comment);Patient requests pain meds (orthos) PT Visit Diagnosis: Other abnormalities of gait and mobility (R26.89)     Time: 1610-9604 PT Time Calculation (min) (ACUTE ONLY): 14 min  Charges:    $Therapeutic Activity: 8-22 mins PT General Charges $$ ACUTE PT VISIT: 1 Visit                    Sloan Duncans, DPT, CLT  Acute Rehabilitation Services Office: [phone] (Secure chat preferred)    Jenice Mitts 03/04/2024, 1:36 PM

## 2024-03-04 NOTE — Progress Notes (Signed)
 STROKE TEAM PROGRESS NOTE   INTERIM HISTORY/SUBJECTIVE  No family at the bedside.  He is lying comfortably in bed.  He follows commands to mimicking and is able to get up and take a few steps without feeling dizzy or having weakness.  He is getting IV hydration with normal saline.  Vital signs are stable.  No neurological deficits. CBC    Component Value Date/Time   WBC 6.7 03/02/2024 1611   RBC 4.88 03/02/2024 1611   HGB 13.6 03/02/2024 1620   HCT 40.0 03/02/2024 1620   PLT 229 03/02/2024 1611   MCV 84.6 03/02/2024 1611   MCH 27.0 03/02/2024 1611   MCHC 32.0 03/02/2024 1611   RDW 12.0 03/02/2024 1611   LYMPHSABS 1.7 03/02/2024 1611   MONOABS 0.5 03/02/2024 1611   EOSABS 0.3 03/02/2024 1611   BASOSABS 0.1 03/02/2024 1611    BMET    Component Value Date/Time   NA 141 03/04/2024 0612   K 3.5 03/04/2024 0612   CL 111 03/04/2024 0612   CO2 18 (L) 03/04/2024 0612   GLUCOSE 103 (H) 03/04/2024 0612   BUN <5 (L) 03/04/2024 0612   CREATININE 1.10 03/04/2024 0612   CALCIUM  9.2 03/04/2024 0612   GFRNONAA >60 03/04/2024 0612    IMAGING past 24 hours No results found.   Vitals:   03/04/24 0006 03/04/24 0425 03/04/24 0758 03/04/24 1134  BP: 92/74 105/77 112/79 119/87  Pulse: 66 62 66 (!) 53  Resp:   16 13  Temp: (!) 97.4 F (36.3 C) 98 F (36.7 C) 97.7 F (36.5 C) 98 F (36.7 C)  TempSrc: Oral Oral Oral Oral  SpO2: 95% 99% 100% 100%  Weight:      Height:         PHYSICAL EXAM General:  Alert, well-nourished, well-developed patient in no acute distress Psych:  Mood and affect appropriate for situation CV: Regular rate and rhythm on monitor Respiratory:  Regular, unlabored respirations on room air GI: Abdomen soft and nontender   NEURO:  Mental Status: awake, alert, deaf and mute.   Cranial Nerves:  II: PERRL. Visual fields full.  III, IV, VI: EOMI. Eyelids elevate symmetrically.  V: Sensation is intact to light touch and symmetrical to face.  VII: Face is  symmetrical resting and smiling VIII: hearing intact to voice. IX, X: Palate elevates symmetrically. Phonation is normal.  ON:GEXBMWUX shrug 5/5. XII: tongue is midline without fasciculations. Motor: 5/5 strength to all muscle groups tested.  Tone: is normal and bulk is normal Sensation- Intact to light touch bilaterally. Extinction absent to light touch to DSS.   Coordination: FTN intact bilaterally, HKS: no ataxia in BLE.No drift.  Gait- deferred      ASSESSMENT/PLAN  [name] is a 33 y.o. 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 NIH on Admission 7  CVST with increased venous infarct, increased symptoms likely secondary to hypotension/dehydration  Code Stroke CT head known venous infarct within the medial left frontal lobe has increased in extent since the head CT of 02/02/2024, now spanning 2.6 cm.  Known subacute infarcts elsewhere within the medial frontal lobes bilaterally, CTV 1.Residual/recurrent occlusive and subocclusive thrombi within the superior sagittal sinus posteriorly, extending to  the confluence of sinuses. 2. Thrombus extends into the medial aspect of the right transverse dural venous sinus. 3. New from the CT venogram head of 02/01/2024, there is some reconstitution of enhancement within the right transverse dural venous sinus more laterally. Clot burden within the right sigmoid sinus has also decreased. 4. Residual/recurrent subocclusive thrombus within the left transverse and sigmoid dural venous sinuses. 5. 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 6. The straight sinus, inferior sagittal sinus and vein of Galen  are now patent. 7. Extensive venous collaterals again noted. MRI  Small areas of abnormal diffusion weighted intensity within the high bilateral paramedian frontal lobes, similar to findings from 02/01/2024, consistent with subacute venous ischemia. 2. Widespread mixed occlusive and nonocclusive dural venous sinus 2D Echo 01/27/24 EF 45 to 50%. 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 Small vessel disease in the setting of CNS infection  Completed course of Doxy   Hyperlipidemia Home meds: Atorvastatin  80 mg, resumed in hospital LDL 279, goal < 70 Continue statin at discharge  Dysphagia Patient has post-stroke dysphagia, SLP consulted    Diet   Diet Heart Room service appropriate? 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  He presented with recurrent transient episodes of right hemiparesis in the upright position which resolved when he would lay flat.  Follow-up MRI scan and CT venogram showed partial recanalization of his extensive cerebral venous sinus thrombosis persistent thrombus.  Continue anticoagulation with Eliquis  and maintain aggressive hydration.  Mobilize out of bed.  Therapy consults.  Observe for another 24 hours and if stable may be discharged back .No sign language interpreter available for communication no  family at the bedside.  .  Greater than 50% time during this 35-minute visit was spent on counseling and coordination of care about his venous sinus thrombosis and venous infarcts in question and discussion with care team Discussed with Dr. Paschal Bon, MD Medical Director [facility] Pager: [phone] 03/04/2024 1:03 PM  To contact Stroke Continuity provider, please refer to [email or web address]. After hours, contact General Neurology

## 2024-03-04 NOTE — TOC Transition Note (Signed)
 Transition of Care [facility]) - Discharge Note   Patient Details  Name: Samuel Warren MRN: [id number] Date of Birth: 07/11/1991  Transition [facility]) CM/SW Contact:  Jonathan Neighbor, RN Phone Number: 03/04/2024, 12:55 PM   Clinical Narrative:     Pt will discharge home with family. He was already attending outpatient therapy through [facility]. CM has sent information to the outpatient rehab that he can continue with therapy.  No new DME needs.  Family will transport home.  Final next level of care: OP Rehab Barriers to Discharge: No Barriers Identified   Patient Goals and CMS Choice            Discharge Placement                       Discharge Plan and Services Additional resources added to the After Visit Summary for                                       Social Drivers of Health (SDOH) Interventions SDOH Screenings   Food Insecurity: No Food Insecurity (03/03/2024)  Housing: Low Risk  (03/03/2024)  Transportation Needs: No Transportation Needs (03/03/2024)  Utilities: Not At Risk (03/03/2024)  Social Connections: Patient Declined (02/08/2024)  Tobacco Use: Low Risk  (03/02/2024)     Readmission Risk Interventions    01/29/2024   11:53 AM  Readmission Risk Prevention Plan  Post Dischage Appt Complete  Medication Screening Complete  Transportation Screening Complete

## 2024-03-04 NOTE — Plan of Care (Signed)
 No acute changes, conts on fiorecet for headache.   Problem: Education: Goal: Knowledge of disease or condition will improve Outcome: Not Met (add Reason) Goal: Knowledge of secondary prevention will improve (MUST DOCUMENT ALL) Outcome: Not Met (add Reason) Goal: Knowledge of patient specific risk factors will improve (DELETE if not current risk factor) Outcome: Not Met (add Reason)   Problem: Ischemic Stroke/TIA Tissue Perfusion: Goal: Complications of ischemic stroke/TIA will be minimized Outcome: Not Met (add Reason)   Problem: Nutrition: Goal: Risk of aspiration will decrease Outcome: Not Met (add Reason) Goal: Dietary intake will improve Outcome: Not Met (add Reason)   Problem: Pain Managment: Goal: General experience of comfort will improve and/or be controlled Outcome: Not Met (add Reason)   Problem: Safety: Goal: Ability to remain free from injury will improve Outcome: Not Met (add Reason)   Problem: Skin Integrity: Goal: Risk for impaired skin integrity will decrease Outcome: Not Met (add Reason)

## 2024-03-04 NOTE — Progress Notes (Signed)
 PROGRESS NOTE  Samuel Warren  XBM:[id number] DOB: [birth year] DOA: 03/02/2024 PCP: [place] Atta, NP   Brief Narrative:  Patient is a 53 male with history of stroke, extensive venous thrombosis, meningoencephalitis, hyperlipidemia, GERD, deaf who was brought from PCP office for the evaluation of right-sided weakness symptoms appear when he stands or sits up but partially resolves when he lies down.  CT head showed no acute findings.  Showed  known venous infarct within the medial left frontal lobe with has increased in extent since the head CT of 02/02/2024, now spanning 2.6 cm, known  subacute infarcts elsewhere within the medial frontal lobes bilaterally.  MRI showed consistent with subacute venous ischemia bilateral paramedian frontal lobes, widespread mixed occlusive and nonocclusive dural venous sinus.  Admitted for stroke workup.  Neurology following.  After discussion with neurology, plan to monitor another day before discharge   Assessment & Plan:  Principal Problem:   Right sided weakness   Right-sided weakness/chronic cavernous venous thrombosis/history of stroke: CT head showed no acute findings.  Showed  known venous infarct within the medial left frontal lobe which has increased in extent since the head CT of 02/02/2024, now spanning 2.6 cm, known  subacute infarcts elsewhere within the medial frontal lobes bilaterally.  MRI showed consistent with subacute venous ischemia bilateral paramedian frontal lobes, widespread mixed occlusive and nonocclusive dural venous sinus.  Admitted for stroke workup.  Neurology following On Eliquis , statin .There is no need for echo, lipid panel or A1c.  PT/OT/speech evaluation requested.  Started on regular diet.  PT recommend outpatient follow-up Normal cortisol.  Orthostatic vitals negative .  Blood pressure soft but stable. Neurology recommended to monitor 24 hours more before discharge.  PT see him again today.  Hypokalemia/hypomagnesemia:  Supplemented and corrected  Abnormal thyroid function test: Slightly low TSH.Normal T4 .  Likely euthyroid sick syndrome  Hyperlipidemia: on statin  GERD: On PPI  Debility/deconditioning: Has history of stroke.  Patient is deaf.  PT recommend outpatient follow-up.  PT see him again today         DVT prophylaxis: apixaban  (ELIQUIS ) tablet 5 mg     Code Status: Full Code  Family Communication: None at bedside  Patient status:Obs  Patient is from :Home  Anticipated discharge UU:VOZD  Estimated DC date:today or tomorrow   Consultants: Neurology  Procedures:None  Antimicrobials:  Anti-infectives (From admission, onward)    None       Subjective: Patient seen and examined at bedside today.   Hemodynamically stable.  Lying on bed No family or sign language interpreter at bedside.  Communicated with patient writing on white board.  He looks very comfortable.  No new complaints.  Moves all extremities freely.  Says he feels strong  Objective: Vitals:   03/03/24 2001 03/04/24 0006 03/04/24 0425 03/04/24 0758  BP: 119/82 92/74 105/77 112/79  Pulse:  66 62 66  Resp: 18   16  Temp: 98.1 F (36.7 C) (!) 97.4 F (36.3 C) 98 F (36.7 C) 97.7 F (36.5 C)  TempSrc: Oral Oral Oral Oral  SpO2: 100% 95% 99% 100%  Weight:      Height:        Intake/Output Summary (Last 24 hours) at 03/04/2024 1118 Last data filed at 03/04/2024 1000 Gross per 24 hour  Intake 120 ml  Output 200 ml  Net -80 ml   Filed Weights   03/02/24 1600 03/02/24 1648  Weight: 66.8 kg 66.8 kg    Examination:  General exam: Overall comfortable,  not in distress,deaf HEENT: PERRL Respiratory system:  no wheezes or crackles  Cardiovascular system: S1 & S2 heard, RRR.  Gastrointestinal system: Abdomen is nondistended, soft and nontender. Central nervous system: Alert and awake Extremities: No edema, no clubbing ,no cyanosis Skin: No rashes, no ulcers,no icterus      Data Reviewed: I have  personally reviewed following labs and imaging studies  CBC: Recent Labs  Lab 03/02/24 1611 03/02/24 1620  WBC 6.7  --   NEUTROABS 4.1  --   HGB 13.2 13.6  HCT 41.3 40.0  MCV 84.6  --   PLT 229  --    Basic Metabolic Panel: Recent Labs  Lab 03/02/24 1611 03/02/24 1620 03/03/24 1057 03/04/24 0612  NA 143 143 145 141  K 3.4* 3.4* 3.6 3.5  CL 110 109 125* 111  CO2 21*  --  14* 18*  GLUCOSE 104* 104* 95 103*  BUN 5* 6 <5* <5*  CREATININE 1.05 1.00 0.66 1.10  CALCIUM  9.1  --  6.7* 9.2  MG  --   --  1.4* 2.3     No results found for this or any previous visit (from the past 240 hours).   Radiology Studies: MR Brain W and Wo Contrast Result Date: 03/02/2024 CLINICAL DATA:  Acute neurologic deficit EXAM: MRI HEAD WITHOUT AND WITH CONTRAST TECHNIQUE: Multiplanar, multiecho pulse sequences of the brain and surrounding structures were obtained without and with intravenous contrast. CONTRAST:  6mL GADAVIST  GADOBUTROL  1 MMOL/ML IV SOLN COMPARISON:  02/01/2024 FINDINGS: Brain: There small areas of abnormal diffusion weighted intensity within the high bilateral paramedian frontal lobes. No definite ADC correlate. Location is similar to findings from 02/01/2024. Areas of signal loss on susceptibility weighted imaging likely reflect engorged cortical veins. There is multifocal hyperintense T2-weighted signal within the white matter. Parenchymal volume and CSF spaces are normal. Vascular: Widespread dural venous sinus thrombosis is better characterized on the concomitant CT venogram. There is mixed occlusive and nonocclusive thrombosis of the superior sagittal sinus, left transverse sinus, left sigmoid sinus and right transverse sinus. 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 Arterial flow voids are normal. Skull and upper cervical spine: Normal calvarium and skull base. Visualized upper cervical spine and soft tissues are normal. Sinuses/Orbits:No paranasal sinus fluid levels or advanced  mucosal thickening. No mastoid or middle ear effusion. Normal orbits. IMPRESSION: 1. Small areas of abnormal diffusion weighted intensity within the high bilateral paramedian frontal lobes, similar to findings from 02/01/2024, consistent with subacute venous ischemia. 2. Widespread mixed occlusive and nonocclusive dural venous sinus thrombosis is better characterized on the concomitant CT venogram. Electronically Signed   By: Juanetta Nordmann M.D.   On: 03/02/2024 20:39   CT HEAD CODE STROKE WO CONTRAST Result Date: 03/02/2024 CLINICAL DATA:  Code stroke. Neuro deficit, acute, stroke suspected. Right-sided facial droop. Right-sided weakness. EXAM: CT VENOGRAM HEAD TECHNIQUE: Venographic phase images of the brain were obtained following the administration of intravenous contrast. Multiplanar reformats and maximum intensity projections were generated. RADIATION DOSE REDUCTION: This exam was performed according to the departmental dose-optimization program which includes automated exposure control, adjustment of the mA and/or kV according to patient size and/or use of iterative reconstruction technique. CONTRAST:  75mL OMNIPAQUE  IOHEXOL  350 MG/ML SOLN COMPARISON:  Prior head CT examinations 02/02/2024 and earlier. Brain MRI 02/01/2024. CT angiogram head/neck 02/01/2024. CT venogram head 02/01/2024. FINDINGS: NON-CONTRAST HEAD CT FINDINGS: Brain: No age-advanced or lobar predominant cerebral atrophy. A known venous infarct within the medial left frontal lobe has increased in  extent since the head CT of 02/02/2024, now spanning 2.6 cm (for instance as seen on series 6, image 35). Known subacute infarcts elsewhere within the bilateral medial frontal lobes, occult by CT and better appreciated on the prior brain MRI of 02/01/2024. Partially empty sella turcica. 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 There is no acute intracranial hemorrhage. No  extra-axial fluid collection. No evidence of an intracranial mass. No midline shift. Vascular: No hyperdense arterial vessel. Venous findings reported below. Skull: No calvarial fracture or aggressive osseous lesion. Sinuses/Orbits: No mass or acute finding within the imaged orbits. No significant paranasal sinus disease at the imaged levels. CT VENOGRAM HEAD FINDINGS: Residual/recurrent occlusive and subocclusive thrombi within the superior sagittal sinus posteriorly, extending to the confluence of sinuses. Thrombus extends into the medial aspect of the right transverse dural venous sinus. New from the CT venogram head of 02/01/2024, there is some reconstitution of enhancement within the right transverse dural venous sinus more laterally. Clot burden within the right sigmoid sinus has also decreased. Residual/recurrent subocclusive thrombus within the left transverse and sigmoid dural venous sinuses. 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 The straight sinus, inferior sagittal sinus and vein of Galen are patent. The internal cerebral veins are patent. (Series 4, image 128). Extensive venous collaterals again noted. These results were called by telephone at the time of interpretation on 03/02/2024 at 4:59 pm to provider Dr. Doretta Gant, who verbally acknowledged these results. IMPRESSION: Non-contrast head CT: 1. 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 Consider a brain MRI to assess for acute components. 2. Known subacute infarcts elsewhere within the medial frontal lobes bilaterally, occult by CT and better appreciated on the prior brain MRI of 02/01/2024. 3. Partially empty sella turcica. This finding can reflect incidental anatomic variation, or alternatively, it can be associated with chronic idiopathic intracranial hypertension (pseudotumor cerebri). 4. Cerebellar tonsillar ectopia, similar to recent  prior examinations. CT venogram head: 1. Residual/recurrent occlusive and subocclusive thrombi within the superior sagittal sinus posteriorly, extending to the confluence of sinuses. 2. Thrombus extends into the medial aspect of the right transverse dural venous sinus. 3. New from the CT venogram head of 02/01/2024, there is some reconstitution of enhancement within the right transverse dural venous sinus more laterally. Clot burden within the right sigmoid sinus has also decreased. 4. Residual/recurrent subocclusive thrombus within the left transverse and sigmoid dural venous sinuses. 5. 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 6. The straight sinus, inferior sagittal sinus and vein of Galen are now patent. 7. Extensive venous collaterals again noted. Electronically Signed   By: Bascom Lily D.O.   On: 03/02/2024 17:00   CT VENOGRAM HEAD Result Date: 03/02/2024 CLINICAL DATA:  Code stroke. Neuro deficit, acute, stroke suspected. Right-sided facial droop. Right-sided weakness. EXAM: CT VENOGRAM HEAD TECHNIQUE: Venographic phase images of the brain were obtained following the administration of intravenous contrast. Multiplanar reformats and maximum intensity projections were generated. RADIATION DOSE REDUCTION: This exam was performed according to the departmental dose-optimization program which includes automated exposure control, adjustment of the mA and/or kV according to patient size and/or use of iterative reconstruction technique. CONTRAST:  75mL OMNIPAQUE  IOHEXOL  350 MG/ML SOLN COMPARISON:  Prior head CT examinations 02/02/2024 and earlier. Brain MRI 02/01/2024. CT angiogram head/neck 02/01/2024. CT venogram head 02/01/2024. FINDINGS: NON-CONTRAST HEAD CT FINDINGS: Brain: No age-advanced or lobar  predominant cerebral atrophy. A known venous infarct within the medial left frontal lobe has increased in extent since the head CT of 02/02/2024, now spanning 2.6 cm (for instance as seen on  series 6, image 35). Known subacute infarcts elsewhere within the bilateral medial frontal lobes, occult by CT and better appreciated on the prior brain MRI of 02/01/2024. Partially empty sella turcica. 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 There is no acute intracranial hemorrhage. No extra-axial fluid collection. No evidence of an intracranial mass. No midline shift. Vascular: No hyperdense arterial vessel. Venous findings reported below. Skull: No calvarial fracture or aggressive osseous lesion. Sinuses/Orbits: No mass or acute finding within the imaged orbits. No significant paranasal sinus disease at the imaged levels. CT VENOGRAM HEAD FINDINGS: Residual/recurrent occlusive and subocclusive thrombi within the superior sagittal sinus posteriorly, extending to the confluence of sinuses. Thrombus extends into the medial aspect of the right transverse dural venous sinus. New from the CT venogram head of 02/01/2024, there is some reconstitution of enhancement within the right transverse dural venous sinus more laterally. Clot burden within the right sigmoid sinus has also decreased. Residual/recurrent subocclusive thrombus within the left transverse and sigmoid dural venous sinuses. 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 The straight sinus, inferior sagittal sinus and vein of Galen are patent. The internal cerebral veins are patent. (Series 4, image 128). Extensive venous collaterals again noted. These results were called by telephone at the time of interpretation on 03/02/2024 at 4:59 pm to provider Dr. Doretta Gant, who verbally acknowledged these results. IMPRESSION: Non-contrast head CT: 1. 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 Consider a brain MRI to assess for acute components. 2. Known subacute infarcts elsewhere within the  medial frontal lobes bilaterally, occult by CT and better appreciated on the prior brain MRI of 02/01/2024. 3. Partially empty sella turcica. This finding can reflect incidental anatomic variation, or alternatively, it can be associated with chronic idiopathic intracranial hypertension (pseudotumor cerebri). 4. 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 CT venogram head: 1. Residual/recurrent occlusive and subocclusive thrombi within the superior sagittal sinus posteriorly, extending to the confluence of sinuses. 2. Thrombus extends into the medial aspect of the right transverse dural venous sinus. 3. New from the CT venogram head of 02/01/2024, there is some reconstitution of enhancement within the right transverse dural venous sinus more laterally. Clot burden within the right sigmoid sinus has also decreased. 4. Residual/recurrent subocclusive thrombus within the left transverse and sigmoid dural venous sinuses. 5. 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 6. The straight sinus, inferior sagittal sinus and vein of Galen are now patent. 7. Extensive venous collaterals again noted. Electronically Signed   By: Bascom Lily D.O.   On: 03/02/2024 17:00    Scheduled Meds:   stroke: early stages of recovery book   Does not apply Once   apixaban   5 mg Oral BID   atorvastatin   80 mg Oral QPM   pantoprazole   40 mg Oral Daily   topiramate   25 mg Oral Daily   Continuous Infusions:     LOS: 1 day   Leona Rake, MD [facility] P4/24/2025, 11:18 AM

## 2024-03-05 ENCOUNTER — Other Ambulatory Visit (HOSPITAL_COMMUNITY): Payer: Self-pay

## 2024-03-05 ENCOUNTER — Ambulatory Visit

## 2024-03-05 ENCOUNTER — Ambulatory Visit: Admitting: Occupational Therapy

## 2024-03-05 DIAGNOSIS — E785 Hyperlipidemia, unspecified: Secondary | ICD-10-CM | POA: Diagnosis not present

## 2024-03-05 DIAGNOSIS — R1319 Other dysphagia: Secondary | ICD-10-CM | POA: Diagnosis not present

## 2024-03-05 DIAGNOSIS — R531 Weakness: Secondary | ICD-10-CM | POA: Diagnosis not present

## 2024-03-05 DIAGNOSIS — I69391 Dysphagia following cerebral infarction: Secondary | ICD-10-CM | POA: Diagnosis not present

## 2024-03-05 DIAGNOSIS — I676 Nonpyogenic thrombosis of intracranial venous system: Secondary | ICD-10-CM | POA: Diagnosis not present

## 2024-03-05 MED ORDER — TOPIRAMATE 25 MG PO TABS
25.0000 mg | ORAL_TABLET | Freq: Every day | ORAL | 0 refills | Status: DC
  Filled 2024-03-05: qty 30, 30d supply, fill #0

## 2024-03-05 MED ORDER — PANTOPRAZOLE SODIUM 40 MG PO TBEC
40.0000 mg | DELAYED_RELEASE_TABLET | Freq: Every day | ORAL | 0 refills | Status: DC
  Filled 2024-03-05: qty 30, 30d supply, fill #0

## 2024-03-05 MED ORDER — BUTALBITAL-APAP-CAFFEINE 50-325-40 MG PO TABS
1.0000 | ORAL_TABLET | Freq: Four times a day (QID) | ORAL | 0 refills | Status: DC | PRN
  Filled 2024-03-05: qty 14, 4d supply, fill #0

## 2024-03-05 NOTE — Discharge Summary (Signed)
 Physician Discharge Summary  Ziad Maye ZHY:[id number] DOB: 04-13-1991 DOA: 03/02/2024  PCP: Alto Atta, NP  Admit date: 03/02/2024 Discharge date: 03/05/2024  Admitted From: Home Disposition:  Home  Discharge Condition:Stable CODE STATUS:FULL Diet recommendation:  Regular / Dysphagia   Brief/Interim Summary: Patient is a 38 male with history of stroke, extensive venous thrombosis, meningoencephalitis, hyperlipidemia, GERD, deaf who was brought from PCP office for the evaluation of right-sided weakness symptoms appear when he stands or sits up but partially resolves when he lies down.  CT head showed no acute findings.  Showed  known venous infarct within the medial left frontal lobe with has increased in extent since the head CT of 02/02/2024, now spanning 2.6 cm, known  subacute infarcts elsewhere within the medial frontal lobes bilaterally.  MRI showed consistent with subacute venous ischemia bilateral paramedian frontal lobes, widespread mixed occlusive and nonocclusive dural venous sinus.  Admitted for stroke workup.  Neurology was following, now cleared for discharge.  PT recommend outpatient follow-up.  Will continue home medications.  Medically stable for discharge home today  Following problems were addressed during the hospitalization:  Right-sided weakness/chronic cavernous venous thrombosis/history of stroke: CT head showed no acute findings.  Showed  known venous infarct within the medial left frontal lobe which has increased in extent since the head CT of 02/02/2024, now spanning 2.6 cm, known  subacute infarcts elsewhere within the medial frontal lobes bilaterally.  MRI showed consistent with subacute venous ischemia bilateral paramedian frontal lobes, widespread mixed occlusive and nonocclusive dural venous sinus.  Admitted for stroke workup.  Neurology was following On Eliquis , statin .There was no need for  new echo, lipid panel or A1c.  PT/OT/speech evaluation requested.   Started on regular diet.  PT recommend outpatient follow-up Normal cortisol.  Orthostatic vitals negative this mrng .  Blood pressure stable. He will follow-up with neurology as an outpatient   hypokalemia/hypomagnesemia: Supplemented and corrected   Abnormal thyroid function test: Slightly low TSH.Normal T4 .  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  Patient is deaf.  PT recommend outpatient follow-up.     Discharge Diagnoses:  Principal Problem:   Right sided weakness    Discharge Instructions  Discharge Instructions     Ambulatory referral to Occupational Therapy   Complete by: As directed    Ambulatory referral to Physical Therapy   Complete by: As directed    Diet general   Complete by: As directed    Discharge instructions   Complete by: As directed    1)Please take your medication as instructed 2)Follow up with neurology and outpatient physical therapy   Increase activity slowly   Complete by: As directed       Allergies as of 03/05/2024   No Known Allergies      Medication List     TAKE these medications    acetaminophen  500 MG tablet Commonly known as: TYLENOL  Take 1,000 mg by mouth every 6 (six) hours as needed for mild pain (pain score 1-3) or fever.   atorvastatin  80 MG tablet Commonly known as: LIPITOR  Take 1 tablet (80 mg total) by mouth daily.   butalbital -acetaminophen -caffeine  50-325-40 MG tablet Commonly known as: FIORICET  Take 1 tablet by mouth every 6 (six) hours as needed (refractory HA.).   Eliquis  5 MG Tabs tablet Generic drug: apixaban  Take 1  tablet (5 mg total) by mouth 2 (two) times daily.   ondansetron  4 MG tablet Commonly known as: ZOFRAN  Take 1 tablet (4 mg total) by mouth every 6 (six) hours as needed for nausea.   topiramate  25 MG tablet Commonly known  as: TOPAMAX  Take 1 tablet (25 mg total) by mouth daily.        Follow-up Information     [facility]. Schedule an appointment as soon as possible for a visit.   Specialty: Rehabilitation Contact information: [address] [place]  [address] [phone]        Alto Atta, NP. Schedule an appointment as soon as possible for a visit in 1 week(s).   Specialty: Family Medicine Contact information: [address] [place] [address] [phone]                No Known Allergies  Consultations: Neurology   Procedures/Studies: MR Brain W and Wo Contrast Result Date: 03/02/2024 CLINICAL DATA:  Acute neurologic deficit EXAM: MRI HEAD WITHOUT AND WITH CONTRAST TECHNIQUE: Multiplanar, multiecho pulse sequences of the brain and surrounding structures were obtained without and with intravenous contrast. CONTRAST:  6mL GADAVIST  GADOBUTROL  1 MMOL/ML IV SOLN COMPARISON:  02/01/2024 FINDINGS: Brain: There small areas of abnormal diffusion weighted intensity within the high bilateral paramedian frontal lobes. No definite ADC correlate. Location is similar to findings from 02/01/2024. Areas of signal loss on susceptibility weighted imaging likely reflect engorged cortical veins. There is multifocal hyperintense T2-weighted signal within the white matter. Parenchymal volume and CSF spaces are normal. Vascular: Widespread dural venous sinus thrombosis is better characterized on the concomitant CT venogram. There is mixed occlusive and nonocclusive thrombosis of the superior sagittal sinus, left transverse sinus, left sigmoid sinus and right transverse sinus. 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 Arterial flow voids are normal. Skull and upper cervical spine: Normal calvarium and skull base. Visualized upper cervical spine and soft tissues are normal. Sinuses/Orbits:No paranasal sinus fluid levels or advanced mucosal  thickening. No mastoid or middle ear effusion. Normal orbits. IMPRESSION: 1. Small areas of abnormal diffusion weighted intensity within the high bilateral paramedian frontal lobes, similar to findings from 02/01/2024, consistent with subacute venous ischemia. 2. Widespread mixed occlusive and nonocclusive dural venous sinus thrombosis is better characterized on the concomitant CT venogram. Electronically Signed   By: Juanetta Nordmann M.D.   On: 03/02/2024 20:39   CT HEAD CODE STROKE WO CONTRAST Result Date: 03/02/2024 CLINICAL DATA:  Code stroke. Neuro deficit, acute, stroke suspected. Right-sided facial droop. Right-sided weakness. EXAM: CT VENOGRAM HEAD TECHNIQUE: Venographic phase images of the brain were obtained following the administration of intravenous contrast. Multiplanar reformats and maximum intensity projections were generated. RADIATION DOSE REDUCTION: This exam was performed according to the departmental dose-optimization program which includes automated exposure control, adjustment of the mA and/or kV according to patient size and/or use of iterative reconstruction technique. CONTRAST:  75mL OMNIPAQUE  IOHEXOL  350 MG/ML SOLN COMPARISON:  Prior head CT examinations 02/02/2024 and earlier. Brain MRI 02/01/2024. CT angiogram head/neck 02/01/2024. CT venogram head 02/01/2024. FINDINGS: NON-CONTRAST HEAD CT FINDINGS: Brain: No age-advanced or lobar predominant cerebral atrophy. 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 Known subacute infarcts elsewhere within the bilateral medial frontal lobes, occult by CT and better appreciated on the prior brain MRI of 02/01/2024. Partially empty sella turcica.  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 There is no acute intracranial hemorrhage. No extra-axial  fluid collection. No evidence of an intracranial mass. No midline shift. Vascular: No hyperdense arterial vessel. Venous findings reported below. Skull: No calvarial fracture or aggressive osseous lesion. Sinuses/Orbits: No mass or acute finding within the imaged orbits. No significant paranasal sinus disease at the imaged levels. CT VENOGRAM HEAD FINDINGS: Residual/recurrent occlusive and subocclusive thrombi within the superior sagittal sinus posteriorly, extending to the confluence of sinuses. Thrombus extends into the medial aspect of the right transverse dural venous sinus. New from the CT venogram head of 02/01/2024, there is some reconstitution of enhancement within the right transverse dural venous sinus more laterally. Clot burden within the right sigmoid sinus has also decreased. Residual/recurrent subocclusive thrombus within the left transverse and sigmoid dural venous sinuses. 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 The straight sinus, inferior sagittal sinus and vein of Galen are patent. The internal cerebral veins are patent. (Series 4, image 128). Extensive venous collaterals again noted. These results were called by telephone at the time of interpretation on 03/02/2024 at 4:59 pm to provider Dr. Doretta Gant, who verbally acknowledged these results. IMPRESSION: Non-contrast head CT: 1. 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 Consider a brain MRI to assess for acute components. 2. Known subacute infarcts elsewhere within the medial frontal lobes bilaterally, occult by CT and better appreciated on the prior brain MRI of 02/01/2024. 3. Partially empty sella turcica. This finding can reflect incidental anatomic variation, or alternatively, it can be associated with chronic idiopathic intracranial hypertension (pseudotumor cerebri). 4. 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 CT venogram head: 1. Residual/recurrent occlusive and subocclusive thrombi within the superior sagittal sinus posteriorly, extending to the confluence of sinuses. 2. Thrombus extends into the medial aspect of the right transverse dural venous sinus. 3. New from the CT venogram head of 02/01/2024, there is some reconstitution of enhancement within the right transverse dural venous sinus more laterally. Clot burden within the right sigmoid sinus has also decreased. 4. Residual/recurrent subocclusive thrombus within the left transverse and sigmoid dural venous sinuses. 5. 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 6. The straight sinus, inferior sagittal sinus and vein of Galen are now patent. 7. Extensive venous collaterals again noted. Electronically Signed   By: Bascom Lily D.O.   On: 03/02/2024 17:00   CT VENOGRAM HEAD Result Date: 03/02/2024 CLINICAL DATA:  Code stroke. Neuro deficit, acute, stroke suspected. Right-sided facial droop. Right-sided weakness. EXAM: CT VENOGRAM HEAD TECHNIQUE: Venographic phase images of the brain were obtained following the administration of intravenous contrast. Multiplanar reformats and maximum intensity projections were generated. RADIATION DOSE REDUCTION: This exam was performed according to the departmental dose-optimization program which includes automated exposure control, adjustment of the mA and/or kV according to patient size and/or use of iterative reconstruction technique. CONTRAST:  75mL OMNIPAQUE  IOHEXOL  350 MG/ML SOLN COMPARISON:  Prior head CT examinations 02/02/2024 and earlier. Brain MRI 02/01/2024. CT angiogram head/neck 02/01/2024. CT venogram head 02/01/2024. FINDINGS: NON-CONTRAST HEAD CT FINDINGS: Brain: No age-advanced or lobar predominant cerebral atrophy. A known venous infarct within the medial left frontal lobe has increased in extent since the head CT of 02/02/2024, now spanning 2.6 cm (for instance as seen on series 6,  image 35). Known subacute infarcts elsewhere within the bilateral medial frontal lobes,  occult by CT and better appreciated on the prior brain MRI of 02/01/2024. Partially empty sella turcica. 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 There is no acute intracranial hemorrhage. No extra-axial fluid collection. No evidence of an intracranial mass. No midline shift. Vascular: No hyperdense arterial vessel. Venous findings reported below. Skull: No calvarial fracture or aggressive osseous lesion. Sinuses/Orbits: No mass or acute finding within the imaged orbits. No significant paranasal sinus disease at the imaged levels. CT VENOGRAM HEAD FINDINGS: Residual/recurrent occlusive and subocclusive thrombi within the superior sagittal sinus posteriorly, extending to the confluence of sinuses. Thrombus extends into the medial aspect of the right transverse dural venous sinus. New from the CT venogram head of 02/01/2024, there is some reconstitution of enhancement within the right transverse dural venous sinus more laterally. Clot burden within the right sigmoid sinus has also decreased. Residual/recurrent subocclusive thrombus within the left transverse and sigmoid dural venous sinuses. 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 The straight sinus, inferior sagittal sinus and vein of Galen are patent. The internal cerebral veins are patent. (Series 4, image 128). Extensive venous collaterals again noted. These results were called by telephone at the time of interpretation on 03/02/2024 at 4:59 pm to provider Dr. Doretta Gant, who verbally acknowledged these results. IMPRESSION: Non-contrast head CT: 1. 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 Consider a brain MRI to assess for acute components. 2. Known subacute infarcts elsewhere within the medial  frontal lobes bilaterally, occult by CT and better appreciated on the prior brain MRI of 02/01/2024. 3. Partially empty sella turcica. This finding can reflect incidental anatomic variation, or alternatively, it can be associated with chronic idiopathic intracranial hypertension (pseudotumor cerebri). 4. 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 CT venogram head: 1. Residual/recurrent occlusive and subocclusive thrombi within the superior sagittal sinus posteriorly, extending to the confluence of sinuses. 2. Thrombus extends into the medial aspect of the right transverse dural venous sinus. 3. New from the CT venogram head of 02/01/2024, there is some reconstitution of enhancement within the right transverse dural venous sinus more laterally. Clot burden within the right sigmoid sinus has also decreased. 4. Residual/recurrent subocclusive thrombus within the left transverse and sigmoid dural venous sinuses. 5. 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 6. The straight sinus, inferior sagittal sinus and vein of Galen are now patent. 7. Extensive venous collaterals again noted. Electronically Signed   By: Bascom Lily D.O.   On: 03/02/2024 17:00      Subjective: Patient seen and examined at bedside today.  Hemodynamically stable.  His family at bedside.  He worked with physical therapy this morning.  No new complaints besides mild headache.  Medically stable for discharge home today.  Discharge Exam: Vitals:   03/05/24 0433 03/05/24 0749  BP: 103/79 117/82  Pulse: 68 62  Resp:  18  Temp: 98.4 F (36.9 C) 98.2 F (36.8 C)  SpO2: 100% 100%   Vitals:   03/04/24 1945 03/05/24 0009 03/05/24 0433 03/05/24 0749  BP: 121/82 101/74 103/79 117/82  Pulse: (!) 59 64 68 62  Resp:    18  Temp: 98.4 F (36.9 C) 98 F (36.7 C) 98.4 F (36.9 C) 98.2 F (36.8 C)  TempSrc: Oral Oral Oral Oral  SpO2: 99% 98% 100% 100%  Weight:      Height:  General: Pt is  alert, awake, not in acute distress Cardiovascular: RRR, S1/S2 +, no rubs, no gallops Respiratory: CTA bilaterally, no wheezing, no rhonchi Abdominal: Soft, NT, ND, bowel sounds + Extremities: no edema, no cyanosis    The results of significant diagnostics from this hospitalization (including imaging, microbiology, ancillary and laboratory) are listed below for reference.     Microbiology: No results found for this or any previous visit (from the past 240 hours).   Labs: BNP (last 3 results) No results for input(s): "BNP" in the last 8760 hours. Basic Metabolic Panel: Recent Labs  Lab 03/02/24 1611 03/02/24 1620 03/03/24 1057 03/04/24 0612  NA 143 143 145 141  K 3.4* 3.4* 3.6 3.5  CL 110 109 125* 111  CO2 21*  --  14* 18*  GLUCOSE 104* 104* 95 103*  BUN 5* 6 <5* <5*  CREATININE 1.05 1.00 0.66 1.10  CALCIUM  9.1  --  6.7* 9.2  MG  --   --  1.4* 2.3   Liver Function Tests: Recent Labs  Lab 03/02/24 1611  AST 30  ALT 54*  ALKPHOS 51  BILITOT 0.7  PROT 6.9  ALBUMIN 4.0   No results for input(s): "LIPASE", "AMYLASE" in the last 168 hours. No results for input(s): "AMMONIA" in the last 168 hours. CBC: Recent Labs  Lab 03/02/24 1611 03/02/24 1620  WBC 6.7  --   NEUTROABS 4.1  --   HGB 13.2 13.6  HCT 41.3 40.0  MCV 84.6  --   PLT 229  --    Cardiac Enzymes: No results for input(s): "CKTOTAL", "CKMB", "CKMBINDEX", "TROPONINI" in the last 168 hours. BNP: Invalid input(s): "POCBNP" CBG: Recent Labs  Lab 03/02/24 1614  GLUCAP 114*   D-Dimer No results for input(s): "DDIMER" in the last 72 hours. Hgb A1c No results for input(s): "HGBA1C" in the last 72 hours. Lipid Profile No results for input(s): "CHOL", "HDL", "LDLCALC", "TRIG", "CHOLHDL", "LDLDIRECT" in the last 72 hours. Thyroid function studies Recent Labs    03/03/24 1057  TSH 0.317*   Anemia work up No results for input(s): "VITAMINB12", "FOLATE", "FERRITIN", "TIBC", "IRON", "RETICCTPCT" in  the last 72 hours. Urinalysis    Component Value Date/Time   COLORURINE AMBER (A) 02/02/2024 0852   APPEARANCEUR CLEAR 02/02/2024 0852   LABSPEC 1.020 02/02/2024 0852   PHURINE 5.5 02/02/2024 0852   GLUCOSEU NEGATIVE 02/02/2024 0852   HGBUR NEGATIVE 02/02/2024 0852   BILIRUBINUR NEGATIVE 02/02/2024 0852   KETONESUR NEGATIVE 02/02/2024 0852   PROTEINUR NEGATIVE 02/02/2024 0852   NITRITE NEGATIVE 02/02/2024 0852   LEUKOCYTESUR NEGATIVE 02/02/2024 0852   Sepsis Labs Recent Labs  Lab 03/02/24 1611  WBC 6.7   Microbiology No results found for this or any previous visit (from the past 240 hours).  Please note: You were cared for by a hospitalist during your hospital stay. Once you are discharged, your primary care physician will handle any further medical issues. Please note that NO REFILLS for any discharge medications will be authorized once you are discharged, as it is imperative that you return to your primary care physician (or establish a relationship with a primary care physician if you do not have one) for your post hospital discharge needs so that they can reassess your need for medications and monitor your lab values.    Time coordinating discharge: 40 minutes  SIGNED:   Leona Rake, MD  [facility] 03/05/2024, 10:11 AM Pager [phone]  If 7PM-7AM, please contact night-coverage [email or web address] Password [email or web address]

## 2024-03-05 NOTE — TOC Transition Note (Signed)
 Transition of Care [facility]) - Discharge Note   Patient Details  Name: Samuel Warren MRN: [id number] Date of Birth: [birth year]  [facility] CM/SW Contact:  Eusebio High, RN Phone Number: 03/05/2024, 11:13 AM   Clinical Narrative:     Patient will DC to home today No new DME needs Patient will continue OP therapy per AVS     Final next level of care: OP Rehab Barriers to Discharge: No Barriers Identified   Patient Goals and CMS Choice            Discharge Placement                       Discharge Plan and Services Additional resources added to the After Visit Summary for                                       Social Drivers of Health (SDOH) Interventions SDOH Screenings   Food Insecurity: No Food Insecurity (03/03/2024)  Housing: Low Risk  (03/03/2024)  Transportation Needs: No Transportation Needs (03/03/2024)  Utilities: Not At Risk (03/03/2024)  Social Connections: Patient Declined (02/08/2024)  Tobacco Use: Low Risk  (03/02/2024)     Readmission Risk Interventions    01/29/2024   11:53 AM  Readmission Risk Prevention Plan  Post Dischage Appt Complete  Medication Screening Complete  Transportation Screening Complete

## 2024-03-05 NOTE — Progress Notes (Signed)
 Physical Therapy Treatment Patient Details Name: Samuel Warren MRN: [id number] DOB: [birth year] Today's Date: 03/05/2024   History of Present Illness Samuel Warren is a 33 y.o. male admitted 03/02/24 from PCP office secondary to right-sided weakness.  Apparently symptoms present especially when standing or sitting up, partially resolving when laying down. MRI found Small areas of abnormal diffusion weighted intensity within the high bilateral paramedian frontal lobes, similar to findings from 02/01/2024, consistent with subacute venous ischemia. 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    PT Comments  Pt is progressing slowly towards goals. Able to stand longer today than last session in order to get full set of orthostatics. Pt is Mod I with bed mobility and sit to stand with RW. Pt is supervision w/RW for gait. Pt limited session due to pain/dizziness in standing. No weakness noted on the R side compared to L in standing or with gait. Due to pt current functional status, home set up and available assistance at home recommending skilled physical therapy services 3x/week in order to address strength, balance and functional mobility to decrease risk for falls, injury and re-hospitalization.       If plan is discharge home, recommend the following: Assist for transportation;Help with stairs or ramp for entrance;Assistance with cooking/housework     Equipment Recommendations  None recommended by PT       Precautions / Restrictions Precautions Precautions: Fall Recall of Precautions/Restrictions: Intact Precaution/Restrictions Comments: unable to fully assess due to communication barriers Restrictions Weight Bearing Restrictions Per Provider Order: No     Mobility  Bed Mobility Overal bed mobility: Modified Independent Bed Mobility: Supine to Sit, Sit to Supine      Supine to sit: Modified independent (Device/Increase time), Used rails, HOB elevated Sit to supine: Modified independent (Device/Increase time)   General bed mobility comments: HOB elevated ~ 20 degrees light use of rails    Transfers Overall transfer level: Modified independent Equipment used: Rolling walker (2 wheels) Transfers: Sit to/from Stand Sit to Stand: Modified independent (Device/Increase time)           General transfer comment: good hand placement. Pt was able to tolerate standing longer today    Ambulation/Gait Ambulation/Gait assistance: Supervision Gait Distance (Feet): 50 Feet Assistive device: Rolling walker (2 wheels) Gait Pattern/deviations: Step-through pattern, Decreased stride length Gait velocity: decreased Gait velocity interpretation: 1.31 - 2.62 ft/sec, indicative of limited community ambulator   General Gait Details: 1x cue to stay closer to RW with gesturing     Balance Overall balance assessment: Modified Independent, Mild deficits observed, not formally tested Sitting-balance support: No upper extremity supported, Feet unsupported Sitting balance-Leahy Scale: Good     Standing balance support: Bilateral upper extremity supported, Single extremity supported Standing balance-Leahy Scale: Fair Standing balance comment: Mod I with RW        Communication Communication Communication: Impaired Factors Affecting Communication: Hearing impaired;Non - English speaking, interpreter not available  Cognition Arousal: Alert Behavior During Therapy: WFL for tasks assessed/performed   PT - Cognitive impairments: Difficult to assess Difficult to assess due to: Hard of hearing/deaf, Non-English speaking       PT - Cognition Comments: pt is deaf and uses minimal ASL. Following commands: Intact Following commands impaired: Only follows one step commands consistently    Cueing Cueing Techniques: Gestural cues, Visual cues     General  Comments General comments (skin integrity, edema, etc.): supine: 120/86, sitting 118/95,  standing: 102/89, standing after 3 min 104/77      Pertinent Vitals/Pain Pain Assessment Pain Assessment: 0-10 Pain Score: 10-Worst pain ever Pain Location: posterior HA Pain Descriptors / Indicators: Grimacing Pain Intervention(s): Monitored during session, Limited activity within patient's tolerance, Patient requesting pain meds-RN notified           PT Goals (current goals can now be found in the care plan section) Acute Rehab PT Goals PT Goal Formulation: Patient unable to participate in goal setting Time For Goal Achievement: 03/17/24 Potential to Achieve Goals: Good Progress towards PT goals: Progressing toward goals    Frequency    Min 2X/week      PT Plan  Continue with current POC        AM-PAC PT "6 Clicks" Mobility   Outcome Measure  Help needed turning from your back to your side while in a flat bed without using bedrails?: None Help needed moving from lying on your back to sitting on the side of a flat bed without using bedrails?: None Help needed moving to and from a bed to a chair (including a wheelchair)?: None Help needed standing up from a chair using your arms (e.g., wheelchair or bedside chair)?: None Help needed to walk in hospital room?: A Little Help needed climbing 3-5 steps with a railing? : A Little 6 Click Score: 22    End of Session Equipment Utilized During Treatment: Gait belt Activity Tolerance: Patient limited by pain Patient left: in bed;with call bell/phone within reach;with bed alarm set Nurse Communication: Mobility status;Other (comment);Patient requests pain meds PT Visit Diagnosis: Other abnormalities of gait and mobility (R26.89)     Time: 7829-5621 PT Time Calculation (min) (ACUTE ONLY): 16 min  Charges:    $Therapeutic Activity: 8-22 mins PT General Charges $$ ACUTE PT VISIT: 1 Visit                    Sloan Duncans, DPT,  CLT  Acute Rehabilitation Services Office: [phone] (Secure chat preferred)    Jenice Mitts 03/05/2024, 9:40 AM

## 2024-03-05 NOTE — Progress Notes (Signed)
 STROKE TEAM PROGRESS NOTE   INTERIM HISTORY/SUBJECTIVE  His brother and other family members are the bedside.  He is lying comfortably in bed.  He follows commands to mimicking and is able to get up and take a few steps without feeling dizzy or having weakness.  [place] language interpreter and sign language interpreter present   CBC    Component Value Date/Time   WBC 6.7 03/02/2024 1611   RBC 4.88 03/02/2024 1611   HGB 13.6 03/02/2024 1620   HCT 40.0 03/02/2024 1620   PLT 229 03/02/2024 1611   MCV 84.6 03/02/2024 1611   MCH 27.0 03/02/2024 1611   MCHC 32.0 03/02/2024 1611   RDW 12.0 03/02/2024 1611   LYMPHSABS 1.7 03/02/2024 1611   MONOABS 0.5 03/02/2024 1611   EOSABS 0.3 03/02/2024 1611   BASOSABS 0.1 03/02/2024 1611    BMET    Component Value Date/Time   NA 141 03/04/2024 0612   K 3.5 03/04/2024 0612   CL 111 03/04/2024 0612   CO2 18 (L) 03/04/2024 0612   GLUCOSE 103 (H) 03/04/2024 0612   BUN <5 (L) 03/04/2024 0612   CREATININE 1.10 03/04/2024 0612   CALCIUM  9.2 03/04/2024 0612   GFRNONAA >60 03/04/2024 0612    IMAGING past 24 hours No results found.   Vitals:   03/05/24 0009 03/05/24 0433 03/05/24 0749 03/05/24 1147  BP: 101/74 103/79 117/82 (!) 110/96  Pulse: 64 68 62 (!) 51  Resp:   18 18  Temp: 98 F (36.7 C) 98.4 F (36.9 C) 98.2 F (36.8 C) (!) 97.5 F (36.4 C)  TempSrc: Oral Oral Oral Oral  SpO2: 98% 100% 100% 100%  Weight:      Height:         PHYSICAL EXAM General:  Alert, well-nourished, well-developed patient in no acute distress Psych:  Mood and affect appropriate for situation CV: Regular rate and rhythm on monitor Respiratory:  Regular, unlabored respirations on room air GI: Abdomen soft and nontender   NEURO:  Mental Status: awake, alert, deaf and mute.   Cranial Nerves:  II: PERRL. Visual fields full.  III, IV, VI: EOMI. Eyelids elevate symmetrically.  V: Sensation is intact to light touch and symmetrical to face.  VII:  Face is symmetrical resting and smiling VIII: hearing intact to voice. IX, X: Palate elevates symmetrically. Phonation is normal.  ZO:[id number] shrug 5/5. XII: tongue is midline without fasciculations. Motor: 5/5 strength to all muscle groups tested.  Tone: is normal and bulk is normal Sensation- Intact to light touch bilaterally. Extinction absent to light touch to DSS.   Coordination: FTN intact bilaterally, HKS: no ataxia in BLE.No drift.  Gait- deferred      ASSESSMENT/PLAN  Samuel Warren is a 33 y.o. 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 NIH on Admission 7  CVST with increased venous infarct, increased symptoms likely secondary to hypotension/dehydration  Code Stroke CT head known venous infarct within the medial left frontal lobe has increased in extent since the head CT of 02/02/2024, now spanning 2.6 cm.  Known subacute infarcts elsewhere within the medial frontal lobes bilaterally, CTV 1.Residual/recurrent occlusive and subocclusive thrombi within the superior sagittal sinus posteriorly, extending to the confluence  of sinuses. 2. Thrombus extends into the medial aspect of the right transverse dural venous sinus. 3. New from the CT venogram head of 02/01/2024, there is some reconstitution of enhancement within the right transverse dural venous sinus more laterally. Clot burden within the right sigmoid sinus has also decreased. 4. Residual/recurrent subocclusive thrombus within the left transverse and sigmoid dural venous sinuses. 5. 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 6. The straight sinus, inferior sagittal sinus and vein of  [place] are now patent. 7. Extensive venous collaterals again noted. MRI  Small areas of abnormal diffusion weighted intensity within the high bilateral paramedian frontal lobes, similar to findings from 02/01/2024, consistent with subacute venous ischemia. 2. Widespread mixed occlusive and nonocclusive dural venous sinus 2D Echo 01/27/24 EF 45 to 50%. 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 Small vessel disease in the setting of CNS infection  Completed course of Doxy   Hyperlipidemia Home meds: Atorvastatin  80 mg, resumed in hospital LDL 279, goal < 70 Continue statin at discharge  Dysphagia Patient has post-stroke dysphagia, SLP consulted    Diet   Diet Heart Room service appropriate? No; Fluid consistency: Thin   Advance diet as tolerated  Other Stroke Risk Factors ETOH use, alcohol level <15, advised to drink no more than 2 drink(s) a day   Other Active Problems GERD  Hospital day # 2    Continue anticoagulation with Eliquis  and maintain aggressive hydration and drink at least 12 glasses of fluids a day.  Long discussion with the patient and brother and another family using sign language interpreter and [place] language interpreter at the same time and answered questions.  Follow-up as an outpatient in the stroke clinic patient already has an appointment in June and was asked to keep that appointment..  Greater than 50% time during this 35-minute visit was spent on counseling and coordination of care about his venous sinus thrombosis and venous infarcts in question and discussion with care team Discussed with Dr. Paschal Bon, MD Medical Director [facility]  Stroke Center Pager: [phone] 03/05/2024 4:34 PM  To contact Stroke Continuity provider, please refer to [email or web address]. After hours, contact General Neurology

## 2024-03-08 ENCOUNTER — Emergency Department (HOSPITAL_COMMUNITY)

## 2024-03-08 ENCOUNTER — Inpatient Hospital Stay (HOSPITAL_COMMUNITY)
Admission: EM | Admit: 2024-03-08 | Discharge: 2024-03-18 | DRG: 040 | Disposition: A | Discharge status: Rehabilitation Facility | Attending: Family Medicine | Admitting: Family Medicine

## 2024-03-08 ENCOUNTER — Other Ambulatory Visit: Payer: Self-pay

## 2024-03-08 ENCOUNTER — Inpatient Hospital Stay (HOSPITAL_COMMUNITY)

## 2024-03-08 DIAGNOSIS — G09 Sequelae of inflammatory diseases of central nervous system: Secondary | ICD-10-CM | POA: Diagnosis present

## 2024-03-08 DIAGNOSIS — G936 Cerebral edema: Secondary | ICD-10-CM | POA: Diagnosis present

## 2024-03-08 DIAGNOSIS — I2510 Atherosclerotic heart disease of native coronary artery without angina pectoris: Secondary | ICD-10-CM | POA: Diagnosis present

## 2024-03-08 DIAGNOSIS — R531 Weakness: Secondary | ICD-10-CM | POA: Diagnosis not present

## 2024-03-08 DIAGNOSIS — R066 Hiccough: Secondary | ICD-10-CM | POA: Diagnosis not present

## 2024-03-08 DIAGNOSIS — R29725 NIHSS score 25: Secondary | ICD-10-CM | POA: Diagnosis present

## 2024-03-08 DIAGNOSIS — E43 Unspecified severe protein-calorie malnutrition: Secondary | ICD-10-CM | POA: Diagnosis present

## 2024-03-08 DIAGNOSIS — I636 Cerebral infarction due to cerebral venous thrombosis, nonpyogenic: Principal | ICD-10-CM | POA: Diagnosis present

## 2024-03-08 DIAGNOSIS — B941 Sequelae of viral encephalitis: Secondary | ICD-10-CM

## 2024-03-08 DIAGNOSIS — E874 Mixed disorder of acid-base balance: Secondary | ICD-10-CM | POA: Diagnosis present

## 2024-03-08 DIAGNOSIS — I959 Hypotension, unspecified: Secondary | ICD-10-CM | POA: Diagnosis not present

## 2024-03-08 DIAGNOSIS — Z8673 Personal history of transient ischemic attack (TIA), and cerebral infarction without residual deficits: Secondary | ICD-10-CM | POA: Diagnosis not present

## 2024-03-08 DIAGNOSIS — I639 Cerebral infarction, unspecified: Secondary | ICD-10-CM | POA: Diagnosis not present

## 2024-03-08 DIAGNOSIS — R112 Nausea with vomiting, unspecified: Secondary | ICD-10-CM | POA: Diagnosis not present

## 2024-03-08 DIAGNOSIS — E785 Hyperlipidemia, unspecified: Secondary | ICD-10-CM | POA: Diagnosis present

## 2024-03-08 DIAGNOSIS — G9340 Encephalopathy, unspecified: Secondary | ICD-10-CM | POA: Diagnosis present

## 2024-03-08 DIAGNOSIS — Z6824 Body mass index (BMI) 24.0-24.9, adult: Secondary | ICD-10-CM | POA: Diagnosis not present

## 2024-03-08 DIAGNOSIS — R270 Ataxia, unspecified: Secondary | ICD-10-CM | POA: Diagnosis present

## 2024-03-08 DIAGNOSIS — R0681 Apnea, not elsewhere classified: Secondary | ICD-10-CM | POA: Diagnosis not present

## 2024-03-08 DIAGNOSIS — H913 Deaf nonspeaking, not elsewhere classified: Secondary | ICD-10-CM | POA: Diagnosis present

## 2024-03-08 DIAGNOSIS — Z538 Procedure and treatment not carried out for other reasons: Secondary | ICD-10-CM | POA: Diagnosis not present

## 2024-03-08 DIAGNOSIS — T148XXS Other injury of unspecified body region, sequela: Secondary | ICD-10-CM | POA: Diagnosis not present

## 2024-03-08 DIAGNOSIS — G08 Intracranial and intraspinal phlebitis and thrombophlebitis: Secondary | ICD-10-CM

## 2024-03-08 DIAGNOSIS — D649 Anemia, unspecified: Secondary | ICD-10-CM | POA: Diagnosis not present

## 2024-03-08 DIAGNOSIS — G8191 Hemiplegia, unspecified affecting right dominant side: Secondary | ICD-10-CM | POA: Diagnosis present

## 2024-03-08 DIAGNOSIS — I495 Sick sinus syndrome: Secondary | ICD-10-CM | POA: Diagnosis present

## 2024-03-08 DIAGNOSIS — I5020 Unspecified systolic (congestive) heart failure: Secondary | ICD-10-CM | POA: Diagnosis present

## 2024-03-08 DIAGNOSIS — I676 Nonpyogenic thrombosis of intracranial venous system: Secondary | ICD-10-CM | POA: Diagnosis not present

## 2024-03-08 DIAGNOSIS — R1310 Dysphagia, unspecified: Secondary | ICD-10-CM | POA: Diagnosis present

## 2024-03-08 DIAGNOSIS — R414 Neurologic neglect syndrome: Secondary | ICD-10-CM | POA: Diagnosis present

## 2024-03-08 DIAGNOSIS — Z7901 Long term (current) use of anticoagulants: Secondary | ICD-10-CM | POA: Diagnosis not present

## 2024-03-08 DIAGNOSIS — W19XXXA Unspecified fall, initial encounter: Secondary | ICD-10-CM | POA: Diagnosis present

## 2024-03-08 DIAGNOSIS — I951 Orthostatic hypotension: Secondary | ICD-10-CM | POA: Diagnosis not present

## 2024-03-08 DIAGNOSIS — R519 Headache, unspecified: Secondary | ICD-10-CM | POA: Diagnosis not present

## 2024-03-08 DIAGNOSIS — Y92009 Unspecified place in unspecified non-institutional (private) residence as the place of occurrence of the external cause: Secondary | ICD-10-CM

## 2024-03-08 DIAGNOSIS — I6529 Occlusion and stenosis of unspecified carotid artery: Secondary | ICD-10-CM | POA: Diagnosis not present

## 2024-03-08 DIAGNOSIS — E876 Hypokalemia: Secondary | ICD-10-CM | POA: Diagnosis not present

## 2024-03-08 DIAGNOSIS — Z8661 Personal history of infections of the central nervous system: Secondary | ICD-10-CM | POA: Diagnosis not present

## 2024-03-08 DIAGNOSIS — I69351 Hemiplegia and hemiparesis following cerebral infarction affecting right dominant side: Secondary | ICD-10-CM | POA: Diagnosis not present

## 2024-03-08 DIAGNOSIS — K219 Gastro-esophageal reflux disease without esophagitis: Secondary | ICD-10-CM | POA: Diagnosis present

## 2024-03-08 DIAGNOSIS — K5900 Constipation, unspecified: Secondary | ICD-10-CM | POA: Diagnosis not present

## 2024-03-08 DIAGNOSIS — R569 Unspecified convulsions: Secondary | ICD-10-CM | POA: Diagnosis not present

## 2024-03-08 DIAGNOSIS — G9389 Other specified disorders of brain: Secondary | ICD-10-CM | POA: Diagnosis not present

## 2024-03-08 DIAGNOSIS — Z79899 Other long term (current) drug therapy: Secondary | ICD-10-CM

## 2024-03-08 DIAGNOSIS — Z603 Acculturation difficulty: Secondary | ICD-10-CM | POA: Diagnosis present

## 2024-03-08 DIAGNOSIS — R464 Slowness and poor responsiveness: Secondary | ICD-10-CM | POA: Diagnosis not present

## 2024-03-08 LAB — I-STAT CHEM 8, ED
BUN: 7 mg/dL (ref 6–20)
Calcium, Ion: 1.01 mmol/L — ABNORMAL LOW (ref 1.15–1.40)
Chloride: 115 mmol/L — ABNORMAL HIGH (ref 98–111)
Creatinine, Ser: 0.8 mg/dL (ref 0.61–1.24)
Glucose, Bld: 111 mg/dL — ABNORMAL HIGH (ref 70–99)
HCT: 40 % (ref 39.0–52.0)
Hemoglobin: 13.6 g/dL (ref 13.0–17.0)
Potassium: 3.6 mmol/L (ref 3.5–5.1)
Sodium: 144 mmol/L (ref 135–145)
TCO2: 17 mmol/L — ABNORMAL LOW (ref 22–32)

## 2024-03-08 LAB — LIPID PANEL
Cholesterol: 117 mg/dL (ref 0–200)
HDL: 36 mg/dL — ABNORMAL LOW (ref >40)
LDL Cholesterol: 58 mg/dL (ref 0–99)
Total CHOL/HDL Ratio: 3.3 ratio
Triglycerides: 117 mg/dL (ref <150)
VLDL: 23 mg/dL (ref 0–40)

## 2024-03-08 LAB — APTT
aPTT: 47 s — ABNORMAL HIGH (ref 24–36)
aPTT: 56 s — ABNORMAL HIGH (ref 24–36)

## 2024-03-08 LAB — DIFFERENTIAL
Abs Immature Granulocytes: 0.04 10*3/uL (ref 0.00–0.07)
Basophils Absolute: 0.1 10*3/uL (ref 0.0–0.1)
Basophils Relative: 1 %
Eosinophils Absolute: 0 10*3/uL (ref 0.0–0.5)
Eosinophils Relative: 0 %
Immature Granulocytes: 0 %
Lymphocytes Relative: 12 %
Lymphs Abs: 1.5 10*3/uL (ref 0.7–4.0)
Monocytes Absolute: 0.9 10*3/uL (ref 0.1–1.0)
Monocytes Relative: 7 %
Neutro Abs: 10.1 10*3/uL — ABNORMAL HIGH (ref 1.7–7.7)
Neutrophils Relative %: 80 %

## 2024-03-08 LAB — PROTIME-INR
INR: 1.4 — ABNORMAL HIGH (ref 0.8–1.2)
Prothrombin Time: 17.1 s — ABNORMAL HIGH (ref 11.4–15.2)

## 2024-03-08 LAB — ETHANOL: Alcohol, Ethyl (B): <15 mg/dL (ref <15)

## 2024-03-08 LAB — COMPREHENSIVE METABOLIC PANEL WITH GFR
ALT: 42 U/L (ref 0–44)
AST: 25 U/L (ref 15–41)
Albumin: 4.2 g/dL (ref 3.5–5.0)
Alkaline Phosphatase: 66 U/L (ref 38–126)
Anion gap: 14 (ref 5–15)
BUN: 8 mg/dL (ref 6–20)
CO2: 18 mmol/L — ABNORMAL LOW (ref 22–32)
Calcium: 9.3 mg/dL (ref 8.9–10.3)
Chloride: 109 mmol/L (ref 98–111)
Creatinine, Ser: 1.09 mg/dL (ref 0.61–1.24)
GFR, Estimated: >60 mL/min (ref >60)
Glucose, Bld: 120 mg/dL — ABNORMAL HIGH (ref 70–99)
Potassium: 3.8 mmol/L (ref 3.5–5.1)
Sodium: 141 mmol/L (ref 135–145)
Total Bilirubin: 0.8 mg/dL (ref 0.0–1.2)
Total Protein: 7.3 g/dL (ref 6.5–8.1)

## 2024-03-08 LAB — CBC
HCT: 44.4 % (ref 39.0–52.0)
Hemoglobin: 14.6 g/dL (ref 13.0–17.0)
MCH: 26.7 pg (ref 26.0–34.0)
MCHC: 32.9 g/dL (ref 30.0–36.0)
MCV: 81.3 fL (ref 80.0–100.0)
Platelets: 230 10*3/uL (ref 150–400)
RBC: 5.46 MIL/uL (ref 4.22–5.81)
RDW: 12.2 % (ref 11.5–15.5)
WBC: 12.6 10*3/uL — ABNORMAL HIGH (ref 4.0–10.5)
nRBC: 0 % (ref 0.0–0.2)

## 2024-03-08 LAB — PROCALCITONIN: Procalcitonin: <0.1 ng/mL

## 2024-03-08 LAB — HEPARIN LEVEL (UNFRACTIONATED): Heparin Unfractionated: >1.1 [IU]/mL — ABNORMAL HIGH (ref 0.30–0.70)

## 2024-03-08 LAB — CBG MONITORING, ED: Glucose-Capillary: 126 mg/dL — ABNORMAL HIGH (ref 70–99)

## 2024-03-08 LAB — CK: Total CK: 262 U/L (ref 49–397)

## 2024-03-08 MED ORDER — ACETAMINOPHEN 325 MG PO TABS: 650.0000 mg | ORAL_TABLET | ORAL | Status: DC | PRN

## 2024-03-08 MED ORDER — MORPHINE SULFATE (PF) 2 MG/ML IV SOLN
2.0000 mg | INTRAVENOUS | Status: DC | PRN
  Administered 2024-03-08 – 2024-03-15 (×4): 2 mg via INTRAVENOUS
  Filled 2024-03-08 (×4): qty 1

## 2024-03-08 MED ORDER — SODIUM CHLORIDE 0.9% FLUSH: 3.0000 mL | Freq: Once | INTRAVENOUS | Status: DC

## 2024-03-08 MED ORDER — BUTALBITAL-APAP-CAFFEINE 50-325-40 MG PO TABS
1.0000 | ORAL_TABLET | Freq: Four times a day (QID) | ORAL | Status: DC | PRN
  Administered 2024-03-10 – 2024-03-12 (×3): 1 via ORAL
  Filled 2024-03-08 (×3): qty 1

## 2024-03-08 MED ORDER — PANTOPRAZOLE SODIUM 40 MG PO TBEC
40.0000 mg | DELAYED_RELEASE_TABLET | Freq: Every day | ORAL | Status: DC
  Administered 2024-03-10 – 2024-03-18 (×8): 40 mg via ORAL
  Filled 2024-03-08 (×9): qty 1

## 2024-03-08 MED ORDER — SODIUM CHLORIDE 0.9 % IV SOLN
INTRAVENOUS | Status: AC
Start: 2024-03-08 — End: 2024-03-09

## 2024-03-08 MED ORDER — POTASSIUM CHLORIDE 20 MEQ PO PACK: 40.0000 meq | PACK | Freq: Once | ORAL | Status: DC

## 2024-03-08 MED ORDER — GADOBUTROL 1 MMOL/ML IV SOLN
7.0000 mL | Freq: Once | INTRAVENOUS | Status: AC | PRN
  Administered 2024-03-08: 7 mL via INTRAVENOUS

## 2024-03-08 MED ORDER — ONDANSETRON HCL 4 MG PO TABS: 4.0000 mg | ORAL_TABLET | Freq: Four times a day (QID) | ORAL | Status: DC | PRN

## 2024-03-08 MED ORDER — ATORVASTATIN CALCIUM 80 MG PO TABS
80.0000 mg | ORAL_TABLET | Freq: Every day | ORAL | Status: DC
  Administered 2024-03-10 – 2024-03-18 (×8): 80 mg via ORAL
  Filled 2024-03-08: qty 2
  Filled 2024-03-08: qty 1
  Filled 2024-03-08: qty 2
  Filled 2024-03-08: qty 1
  Filled 2024-03-08: qty 2
  Filled 2024-03-08 (×2): qty 1
  Filled 2024-03-08 (×2): qty 2

## 2024-03-08 MED ORDER — TOPIRAMATE 25 MG PO TABS
25.0000 mg | ORAL_TABLET | Freq: Every day | ORAL | Status: DC
  Administered 2024-03-10 – 2024-03-11 (×2): 25 mg via ORAL
  Filled 2024-03-08 (×2): qty 1

## 2024-03-08 MED ORDER — IOHEXOL 350 MG/ML SOLN
100.0000 mL | Freq: Once | INTRAVENOUS | Status: AC | PRN
  Administered 2024-03-08: 100 mL via INTRAVENOUS

## 2024-03-08 MED ORDER — SODIUM CHLORIDE 0.9 % IV BOLUS
1000.0000 mL | Freq: Once | INTRAVENOUS | Status: AC
  Administered 2024-03-08: 1000 mL via INTRAVENOUS

## 2024-03-08 MED ORDER — HEPARIN (PORCINE) 25000 UT/250ML-% IV SOLN
900.0000 [IU]/h | INTRAVENOUS | Status: DC
  Administered 2024-03-08: 800 [IU]/h via INTRAVENOUS
  Administered 2024-03-09: 900 [IU]/h via INTRAVENOUS
  Filled 2024-03-08 (×2): qty 250

## 2024-03-08 NOTE — Consult Note (Signed)
 NAME:  Samuel Warren, MRN:  [id number], DOB:  1991-11-11, LOS: 0 ADMISSION DATE:  03/08/2024, CONSULTATION DATE:  03/08/24 REFERRING MD:  Tegeler , CHIEF COMPLAINT:   AMS / apnea   History of Present Illness:   33 yo M hx deafness, venous sinus thrombosis on eliquis , meningoencephalitis presented to ED 4/28 as a code stroke. History is very difficult to obtain -- pt is non-english speaking, is deaf, does not communicate with ASL and there is no family available.   Per report, pt had a witnessed fall at home 4/28 at 0200 by a person who reportedly identified self as wife. Pt remained on the floor until EMS was dispatched 4/28 afternoon for CC not eating. EMS noted R sided weakness, L gaze preference and pt was brought as code stroke. Reportedly pt had 2 apneic spell with EMS and required transient BVM.  CT H without acute abnormality. CTA and CTV were obtained and are not yet read but in d/w neuro it is felt that venous thrombus appear worse.   PCCM is consulted in setting of reported pre-hospital apnea.    Pertinent  Medical History  Stroke Venous sinus thrombosis Meningoencephalitis HLD GERD Deafness   Significant Hospital Events: Including procedures, antibiotic start and stop dates in addition to other pertinent events   4/28 ED as code stroke. Neuro and PCCM consults. Admit to [facility]   Interim History / Subjective:  Being started on hep gtt   Objective   Blood pressure 126/87, pulse (!) 52, temperature 98 F (36.7 C), temperature source Oral, resp. rate 15, height 5\' 4"  (1.626 m), weight 65 kg, SpO2 100%.       No intake or output data in the 24 hours ending 03/08/24 1538 Filed Weights   03/08/24 1400  Weight: 65 kg    Examination: General: Chronically and acutely ill adult M NAD  Neuro: Asleep, easily awakens to touch. + gag/cough. RUE is flaccid. L gaze preference. Tracking, does cross midline but is inconsistent. Squeezes L hand.  HENT: NCAT. 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  -there are significant language barriers-- pt is deaf (I also see a note in prior admission that he may also be mute), he does not do ASL, and is vietnamese. Both ASL and vietnamese interpreters were attempted in ED without success (c/d w reported hx). There was no family in the ED. EMS reportedly was dispatched by someone identifying as his wife but there is no contact for a wife & teams who have cared for him in past are unaware of a wife. In d/w teams who have cared for him prior admissions, he cannot communicate with his mother, and there is a cousin who team have used Systems developer with and he has made motions/movements to the patient to explain.  P -There have been no further apneic spells in the ED. He is currently protecting his airway, and is hemodynamically stable without ICU needs -I have d/w neuro as well who agrees that the pt does not have ICU needs at this time.  -recommend TRH / progressive or SDU admission -agree w hep gtt and  IVF as neuro has ordered  -check CK given his period of time down on the ground prior to EMS dispatch   PCCM will sign off. Please re-engage if pt clinical status changes or if we can be of further assistance   Best Practice (right click and "Reselect all SmartList Selections" daily)   Diet/type: NPO DVT prophylaxis systemic heparin  Pressure ulcer(s): pressure ulcer assessment deferred  GI prophylaxis: N/A Lines: N/A Foley:  N/A Code Status:  full code Last date of multidisciplinary goals of care discussion [-- ]  Labs    CBC: Recent Labs  Lab 03/02/24 1611 03/02/24 1620 03/08/24 1403  WBC 6.7  --   --   NEUTROABS 4.1  --   --   HGB 13.2 13.6 13.6  HCT 41.3 40.0 40.0  MCV 84.6  --   --   PLT 229  --   --     Basic Metabolic Panel: Recent Labs  Lab 03/02/24 1611 03/02/24 1620 03/03/24 1057 03/04/24 0612 03/08/24 1403  NA 143 143 145 141 144  K 3.4* 3.4* 3.6 3.5 3.6  CL 110 109 125* 111 115*  CO2 21*  --  14* 18*  --   GLUCOSE 104* 104* 95 103* 111*  BUN 5* 6 <5* <5* 7  CREATININE 1.05 1.00 0.66 1.10 0.80  CALCIUM  9.1  --  6.7* 9.2  --   MG  --   --  1.4* 2.3  --    GFR: Estimated Creatinine Clearance: 108.9 mL/min (by C-G formula based on SCr of 0.8 mg/dL). Recent Labs  Lab 03/02/24 1611  WBC 6.7    Liver Function Tests: Recent Labs  Lab 03/02/24 1611  AST 30  ALT 54*  ALKPHOS 51  BILITOT 0.7  PROT 6.9  ALBUMIN 4.0   No results for input(s): "LIPASE", "AMYLASE" in the last 168 hours. No results for input(s): "AMMONIA" in the last 168 hours.  ABG    Component Value Date/Time   PHART 7.359 02/02/2024 1957   PCO2ART 35.9 02/02/2024 1957   PO2ART 495 (H) 02/02/2024 1957   HCO3 20.6 02/02/2024 1957   TCO2 17 (L) 03/08/2024 1403   ACIDBASEDEF 5.0 (H) 02/02/2024 1957   O2SAT 100 02/02/2024 1957     Coagulation Profile: Recent Labs  Lab 03/02/24 1611  INR 1.4*    Cardiac Enzymes: No results for input(s): "CKTOTAL", "CKMB", "CKMBINDEX", "TROPONINI" in the last 168 hours.  HbA1C: Hgb A1c MFr Bld  Date/Time Value Ref Range Status  01/26/2024 08:47 PM 5.7 (H) 4.8 - 5.6 % Final    Comment:    (NOTE) Pre diabetes:          5.7%-6.4%  Diabetes:              >6.4%  Glycemic control for   <7.0% adults with diabetes     CBG: Recent Labs  Lab 03/02/24 1614 03/08/24 1359  GLUCAP 114* 126*    Review of Systems:   Unable to obtain due to acute medical condition  Past Medical History:  He,  has a past medical history of Deaf.   Surgical History:    Past Surgical History:  Procedure Laterality Date   IR ANGIO INTRA EXTRACRAN SEL COM CAROTID INNOMINATE BILAT MOD SED  02/02/2024   IR THROMBECT VENO MECH MOD SED  02/02/2024   IR VENO/JUGULAR LEFT  02/02/2024   RADIOLOGY WITH ANESTHESIA N/A 02/02/2024   Procedure: RADIOLOGY WITH ANESTHESIA;  Surgeon: Luellen Sages, MD;  Location: [facility] OR;  Service: Radiology;  Laterality: N/A;     Social History:   reports that he has never smoked. He has never used smokeless tobacco. He reports that he does not currently use alcohol. He reports that he does not currently use drugs.   Family History:  His family history is not on file.   Allergies No Known Allergies   Home Medications  Prior to Admission medications   Medication Sig Start Date End Date Taking? Authorizing Provider  acetaminophen  (TYLENOL ) 500 MG tablet Take 1,000 mg by mouth every 6 (six) hours as needed for mild pain (pain score 1-3) or fever.    [provider]  apixaban  (ELIQUIS ) 5 MG TABS tablet Take 1 tablet (5 mg total) by mouth 2 (two) times daily. 02/11/24 03/12/24  Modena Andes, MD  atorvastatin  (LIPITOR ) 80 MG tablet Take 1 tablet (80 mg total) by mouth daily. 02/10/24 03/11/24  Modena Andes, MD  butalbital -acetaminophen -caffeine  (FIORICET ) 50-325-40 MG tablet Take 1 tablet by mouth every 6 (six) hours as needed (refractory HA.). 03/05/24   Leona Rake, MD  ondansetron  (ZOFRAN ) 4 MG tablet Take 1 tablet (4 mg total) by mouth every 6 (six) hours as needed for nausea. 01/29/24   Audria Leather, MD  pantoprazole  (PROTONIX ) 40 MG tablet Take 1 tablet (40 mg total) by mouth daily. 03/05/24   Adhikari, Amrit, MD  topiramate  (TOPAMAX ) 25 MG tablet Take 1 tablet (25 mg total) by mouth daily. 03/05/24   Leona Rake, MD     Critical care time: na     Eston Hence MSN, AGACNP-BC [facility] Pulmonary/Critical Care Medicine Amion for pager  03/08/2024, 4:12 PM

## 2024-03-08 NOTE — Progress Notes (Signed)
EEG complete. Results pending.  ?

## 2024-03-08 NOTE — Consult Note (Addendum)
 NEUROLOGY CONSULT NOTE   Date of service: March 08, 2024 Patient Name: Samuel Warren MRN:  [id number] DOB:  12/12/1990 Chief Complaint: "Code stroke for right side weakness and gaze " Requesting Provider: Tegeler, Marine Sia, *  History of Present Illness  Samuel Warren is a 33 y.o. 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 He was brought in by [facility] for acute onset of right side weakness after being found on the ground.  CT head with no acute process. 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 CTA and CTV obtained. Official read pending. Dr. Christiane Cowing reviewed images and venous thrombus appears worse. Will start heparin  gtt and hydrate patient. Will also check routine EEG   Recent admission for similar presentation and was found to be dehydrated and orthostatic and was discharged on 4/25.   LKW: 1600 Modified rankin score: 1-No significant post stroke disability and can perform usual duties with stroke symptoms IV Thrombolysis:  No outside window EVT:  No LVO  NIHSS components Score: Comment  1a Level of Conscious 0[]  1[x]  2[]  3[]      1b LOC Questions 0[]  1[]  2[x]       1c LOC Commands 0[]  1[]  2[x]       2 Best Gaze 0[]  1[]  2[x]       3 Visual 0[]  1[]  2[x]  3[]      4 Facial Palsy 0[x]  1[]  2[]  3[]      5a Motor Arm - left 0[x]  1[]  2[]  3[]  4[]  UN[]    5b Motor Arm - Right 0[]  1[]  2[]  3[x]  4[]  UN[]    6a Motor Leg - Left 0[]  1[]  2[]  3[x]  4[]  UN[]    6b Motor Leg - Right 0[]  1[]  2[]  3[x]  4[]  UN[]    7 Limb Ataxia 0[x]  1[]  2[]  UN[]      8 Sensory 0[x]  1[]  2[]  UN[]      9 Best Language 0[]  1[]  2[]  3[x]      10 Dysarthria 0[]  1[]  2[x]  UN[]      11 Extinct. and Inattention 0[]  1[]  2[x]       TOTAL: 25      ROS  Comprehensive ROS Unable to ascertain  due to deafness and vietnamese  Past History   Past Medical History:  Diagnosis Date   Deaf     Past Surgical History:  Procedure Laterality Date   IR ANGIO INTRA EXTRACRAN SEL COM CAROTID INNOMINATE BILAT MOD SED  02/02/2024   IR THROMBECT VENO MECH MOD SED  02/02/2024   IR VENO/JUGULAR LEFT  02/02/2024   RADIOLOGY WITH ANESTHESIA N/A 02/02/2024   Procedure: RADIOLOGY WITH ANESTHESIA;  Surgeon: Luellen Sages, MD;  Location: [facility] OR;  Service: Radiology;  Laterality: N/A;    Family History: No family history on file.  Social History  reports that he has never smoked. He has never used smokeless tobacco. He reports that he does not currently use alcohol. He reports that he does not currently use drugs.  No Known Allergies  Medications   Current Facility-Administered Medications:    heparin  ADULT infusion 100 units/mL (25000 units/250mL), 800 Units/hr, Intravenous, Continuous, Ahtziri Jeffries, MD   sodium chloride  0.9 % bolus 1,000 mL, 1,000 mL, Intravenous, Once, Tegeler, Marine Sia, MD   sodium chloride  flush (NS) 0.9 % injection 3 mL, 3 mL, Intravenous, Once, Tegeler, Marine Sia, MD  Current Outpatient Medications:  acetaminophen  (TYLENOL ) 500 MG tablet, Take 1,000 mg by mouth every 6 (six) hours as needed for mild pain (pain score 1-3) or fever., Disp: , Rfl:    apixaban  (ELIQUIS ) 5 MG TABS tablet, Take 1 tablet (5 mg total) by mouth 2 (two) times daily., Disp: 60 tablet, Rfl: 0   atorvastatin  (LIPITOR ) 80 MG tablet, Take 1 tablet (80 mg total) by mouth daily., Disp: 30 tablet, Rfl: 0   butalbital -acetaminophen -caffeine  (FIORICET ) 50-325-40 MG tablet, Take 1 tablet by mouth every 6 (six) hours as needed (refractory HA.)., Disp: 14 tablet, Rfl: 0   ondansetron  (ZOFRAN ) 4 MG tablet, Take 1 tablet (4 mg total) by mouth every 6 (six) hours as needed for nausea., Disp: 20 tablet, Rfl: 0   pantoprazole  (PROTONIX ) 40 MG tablet, Take 1 tablet (40 mg total) by mouth daily., Disp:  30 tablet, Rfl: 0   topiramate  (TOPAMAX ) 25 MG tablet, Take 1 tablet (25 mg total) by mouth daily., Disp: 30 tablet, Rfl: 0  Vitals   Vitals:   03/08/24 1400 03/08/24 1435  BP:  124/82  Pulse:  (!) 50  Resp:  13  Temp:  98 F (36.7 C)  TempSrc:  Oral  SpO2:  100%  Weight: 65 kg   Height: 5\' 4"  (1.626 m)     Body mass index is 24.6 kg/m.  Physical Exam   Constitutional: acutely ill Psych: flat Eyes: No scleral injection.  HENT: No OP obstruction.  Head: Normocephalic.  Cardiovascular: Normal rate and regular rhythm.  Respiratory: Effort normal, non-labored breathing currently on NRB GI: Soft.  No distension. There is no tenderness.  Skin: WDI. Oral mucosa dry   Neurologic Examination   Mental Status -  Patient is lethargic. He can mimic. He is [place] and deaf at baseline and hard to communicate with   Cranial Nerves II - XII - II - Visual field with right hemianopia . III, IV, VI - left gaze preference, can intermittently cross midline V - Facial sensation intact bilaterally. VII - Facial movement intact bilaterally. VIII - deaf X - Palate elevates symmetrically. XI - Chin turning & shoulder shrug intact bilaterally. XII - Tongue protrusion intact.  Motor Strength - right arm and right leg with minimal movement, unable to hold against gravity. Left arm antigravity and spontaneous movement. Left leg not lifting antigravity   Bulk was normal and fasciculations were absent.   Motor Tone - Muscle tone was assessed at the neck and appendages and was normal. Sensory - appears to be intact to noxious stimuli Coordination - unable top assess  Gait and Station - deferred.  Labs/Imaging/Neurodiagnostic studies   CBC:  Recent Labs  Lab Mar 12, 2024 1611 03-12-24 1620 03/08/24 1403  WBC 6.7  --   --   NEUTROABS 4.1  --   --   HGB 13.2 13.6 13.6  HCT 41.3 40.0 40.0  MCV 84.6  --   --   PLT 229  --   --    Basic Metabolic Panel:  Lab Results  Component Value  Date   NA 144 03/08/2024   K 3.6 03/08/2024   CO2 18 (L) 03/04/2024   GLUCOSE 111 (H) 03/08/2024   BUN 7 03/08/2024   CREATININE 0.80 03/08/2024   CALCIUM  9.2 03/04/2024   GFRNONAA >60 03/04/2024   Lipid Panel:  Lab Results  Component Value Date   LDLCALC 279 (H) 01/27/2024   HgbA1c:  Lab Results  Component Value Date   HGBA1C 5.7 (H) 01/26/2024   Urine  Drug Screen:     Component Value Date/Time   LABOPIA NONE DETECTED 02/02/2024 0852   COCAINSCRNUR NONE DETECTED 02/02/2024 0852   LABBENZ NONE DETECTED 02/02/2024 0852   AMPHETMU NONE DETECTED 02/02/2024 0852   THCU NONE DETECTED 02/02/2024 0852   LABBARB NONE DETECTED 02/02/2024 0852    Alcohol Level     Component Value Date/Time   ETH <15 03/02/2024 1611   INR  Lab Results  Component Value Date   INR 1.4 (H) 03/02/2024   APTT  Lab Results  Component Value Date   APTT 35 03/02/2024   AED levels: No results found for: "PHENYTOIN", "ZONISAMIDE", "LAMOTRIGINE", "LEVETIRACETA"  CT Head without contrast(Personally reviewed):  Unchanged small subacute left frontal lobe infarct in the setting of known dural venous sinus thrombosis.  CT angio Head and Neck with contrast(Personally reviewed): Official read pending   CTV  Official read pending   Neurodiagnostics rEEG:  Ordered   ASSESSMENT   Samuel Warren is a 33 y.o. 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 He was brought in by [facility] for acute onset of right side weakness after being found on the ground.  RECOMMENDATIONS  - admit to medicine team - heparin  gtt, stroke dose no bolus  - NS bolus 1000cc now and then 100cc/hr  - PT/OT  - EEG to rule out seizure  - MRI brain with and without contrast - Stroke team will follow   ______________________________________________________________________    Signed, Laymond Priestly, NP [facility]  ATTENDING NOTE: I reviewed above note and agree with the assessment and plan. Pt was seen and examined.   33 yo M with recent admission for viral meningitis associated infectious vasculitis, recurrent extensive CVST with left sided venous infarct presented to ED for acute right sided weakness, left gaze preference and right neglect. CT no acute finding but known recent right frontal venous infarct. CTA head and neck showed worsening posterior SSS venous thrombosis but improved transverse and sigmoid sinus thrombosis. Not candidate for TNK given on eliquis , not IR candidate due to no LVO. Whether candidate for IR for venous thrombosis is still unclear. Will put on heparin  IV per stroke protocol, check EEG to rule out seizure, will also need MRI brain with and without. IV hydration. Admit to medicine team. Will follow.  For detailed assessment and plan, please refer to above as I have made changes wherever appropriate.   Consuelo Denmark, MD PhD Stroke Neurology 03/08/2024 4:42 PM

## 2024-03-08 NOTE — Code Documentation (Signed)
 Stroke Response Nurse Documentation Code Documentation  Samuel Warren is a 33 y.o. 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 On Eliquis  (apixaban ) daily. Code stroke was activated by EMS.   Patient from home where he was LKW at 1600 yesterday and was found on the floor, where he has been since 0200 today. EMS was called due to "patient not eating," but on their arrival he had stroke symptoms and code stroke was activated.    Stroke team at the bedside on patient arrival. Labs drawn and patient cleared for CT by Dr. Manus Sellers. Patient to CT with team. NIHSS 25, see documentation for details and code stroke times. Patient with decreased LOC, disoriented, not following commands, left gaze preference , right hemianopia, right arm weakness, bilateral leg weakness, Expressive aphasia , dysarthria , and Visual  neglect on exam. The following imaging was completed:  CT Head, CTA, and CTP. Patient is not a candidate for IV Thrombolytic due to LKW 1600 yesterday. Patient is not a candidate for IR due to no LVO per MD.   Care Plan: q2 nihss and vitals x 12 hours then q4 Bedside handoff with ED RN Samuel Warren.    Marlon Simpson  Stroke Response RN

## 2024-03-08 NOTE — Progress Notes (Signed)
 PHARMACY - ANTICOAGULATION CONSULT NOTE  Pharmacy Consult for heparin  Indication:  Venous sinus thrombosis   No Known Allergies  Patient Measurements: Height: 5\' 4"  (162.6 cm) Weight: 65 kg (143 lb 4.8 oz) IBW/kg (Calculated) : 59.2 HEPARIN  DW (KG): 65  Vital Signs:    Labs: Recent Labs    03/08/24 1403  HGB 13.6  HCT 40.0  CREATININE 0.80    Estimated Creatinine Clearance: 108.9 mL/min (by C-G formula based on SCr of 0.8 mg/dL).   Medical History: Past Medical History:  Diagnosis Date   Deaf      Assessment: 87 yoM recently admitted with venous sinus thrombosis. Discharged home on Eliquis  (unknown last dose (pt is deaf and family is non-english speaking)), presenting to the ED with worsening right sided weakness. CT shows extension of the previous thrombus. Neurology has asked to start heparin  infusion without bolus.    Goal of Therapy:  Heparin  level 0.3-0.5 units/ml aPTT: 66-85 seconds Monitor platelets by anticoagulation protocol: Yes   Plan:  Start heparin  infusion at 800 units/hr Check anti-Xa level in 8 hours and daily while on heparin  Continue to monitor H&H and platelets  Mohammed Andrew, PharmD Clinical Pharmacist 03/08/2024 2:33 PM Please check AMION for all [facility] Pharmacy numbers

## 2024-03-08 NOTE — ED Triage Notes (Signed)
 Pt BIB [facility] from home come in as a Code Stroke.Patient LKW 1600 on 03/07/2024. EMS reported at 0200 got out of bed to the floor, and he was found by the wife. Patient had period of apnea, that he needed to be bag him on ventilator and the second time he was stimulated by EMS and started breathing. He was placed on non rebreather by EMS. He have right sided weakness and right side neglect. Patient is deaf and he is [place]

## 2024-03-08 NOTE — ED Provider Notes (Signed)
 [place] EMERGENCY DEPARTMENT AT [facility] Provider Note   CSN: [id number] Arrival date & time: 03/08/24  1355     History  No chief complaint on file.   Edher Ging is a 33 y.o. male.  The history is provided by the EMS personnel and medical records. The history is limited by a language barrier. No language interpreter was used.  Neurologic Problem This is a recurrent problem. The current episode started 12 to 24 hours ago. The problem occurs constantly. The problem has not changed since onset.Pertinent negatives include no chest pain, no abdominal pain and no shortness of breath. Nothing aggravates the symptoms. Nothing relieves the symptoms. He has tried nothing for the symptoms.       Home Medications Prior to Admission medications   Medication Sig Start Date End Date Taking? Authorizing Provider  acetaminophen  (TYLENOL ) 500 MG tablet Take 1,000 mg by mouth every 6 (six) hours as needed for mild pain (pain score 1-3) or fever.    [provider]  apixaban  (ELIQUIS ) 5 MG TABS tablet Take 1 tablet (5 mg total) by mouth 2 (two) times daily. 02/11/24 03/12/24  Modena Andes, MD  atorvastatin  (LIPITOR ) 80 MG tablet Take 1 tablet (80 mg total) by mouth daily. 02/10/24 03/11/24  Modena Andes, MD  butalbital -acetaminophen -caffeine  (FIORICET ) 50-325-40 MG tablet Take 1 tablet by mouth every 6 (six) hours as needed (refractory HA.). 03/05/24   Leona Rake, MD  ondansetron  (ZOFRAN ) 4 MG tablet Take 1 tablet (4 mg total) by mouth every 6 (six) hours as needed for nausea. 01/29/24   Audria Leather, MD  pantoprazole  (PROTONIX ) 40 MG tablet Take 1 tablet (40 mg total) by mouth daily. 03/05/24   Leona Rake, MD  topiramate  (TOPAMAX ) 25 MG tablet Take 1 tablet (25 mg total) by mouth daily. 03/05/24   Leona Rake, MD      Allergies    Patient has no known allergies.    Review of Systems   Review of Systems  Unable to perform ROS: Patient nonverbal  Constitutional:   Negative for fever.  Respiratory:  Negative for cough and shortness of breath.   Cardiovascular:  Negative for chest pain.  Gastrointestinal:  Negative for abdominal pain, constipation, diarrhea, nausea and vomiting.  Neurological:  Positive for weakness.  Psychiatric/Behavioral:  Negative for agitation.     Physical Exam Updated Vital Signs Ht 5\' 4"  (1.626 m)   Wt 65 kg   BMI 24.60 kg/m  Physical Exam Vitals and nursing note reviewed.  Constitutional:      General: He is not in acute distress.    Appearance: He is well-developed. He is ill-appearing.  HENT:     Head: Normocephalic and atraumatic.     Nose: No congestion or rhinorrhea.     Mouth/Throat:     Mouth: Mucous membranes are moist.  Eyes:     Conjunctiva/sclera: Conjunctivae normal.     Pupils: Pupils are equal, round, and reactive to light.  Cardiovascular:     Rate and Rhythm: Normal rate and regular rhythm.     Heart sounds: No murmur heard. Pulmonary:     Effort: Pulmonary effort is normal. No respiratory distress.     Breath sounds: Normal breath sounds. No wheezing, rhonchi or rales.  Chest:     Chest wall: No tenderness.  Abdominal:     Palpations: Abdomen is soft.     Tenderness: There is no abdominal tenderness. There is no guarding or rebound.  Musculoskeletal:  General: No swelling or tenderness.     Cervical back: Neck supple. No tenderness.  Skin:    General: Skin is warm and dry.     Capillary Refill: Capillary refill takes less than 2 seconds.     Findings: No erythema.  Neurological:     Motor: Weakness present.  Psychiatric:        Mood and Affect: Mood normal.     ED Results / Procedures / Treatments   Labs (all labs ordered are listed, but only abnormal results are displayed) Labs Reviewed  I-STAT CHEM 8, ED - Abnormal; Notable for the following components:      Result Value   Chloride 115 (*)    Glucose, Bld 111 (*)    Calcium , Ion 1.01 (*)    TCO2 17 (*)    All other  components within normal limits  CBG MONITORING, ED - Abnormal; Notable for the following components:   Glucose-Capillary 126 (*)    All other components within normal limits  CULTURE, BLOOD (ROUTINE X 2)  CULTURE, BLOOD (ROUTINE X 2)  PROTIME-INR  APTT  CBC  DIFFERENTIAL  COMPREHENSIVE METABOLIC PANEL WITH GFR  ETHANOL  CK  HEPARIN  LEVEL (UNFRACTIONATED)  APTT  LIPID PANEL  PROCALCITONIN  CBC  APTT  HEPARIN  LEVEL (UNFRACTIONATED)    EKG None  Radiology CT VENOGRAM HEAD Result Date: 03/08/2024 CLINICAL DATA:  Dural venous sinus thrombosis. Right-sided weakness. EXAM: CT VENOGRAM HEAD TECHNIQUE: Venographic phase images of the brain were obtained following the administration of intravenous contrast. Multiplanar reformats and maximum intensity projections were generated. RADIATION DOSE REDUCTION: This exam was performed according to the departmental dose-optimization program which includes automated exposure control, adjustment of the mA and/or kV according to patient size and/or use of iterative reconstruction technique. CONTRAST:  OMNIPAQUE  IOHEXOL  350 MG/ML SOLN COMPARISON:  CT venogram 03/02/2024 FINDINGS: Extensive mixed occlusive and subocclusive thrombus in the posterior aspect of the superior sagittal sinus extending to the torcula has mildly progressed from the prior CT venogram. Subocclusive thrombus in the transverse and sigmoid sinuses bilaterally is overall mildly improved. 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 The straight sinus, inferior sagittal sinus, vein of Galen, and internal cerebral veins are patent. Extensive venous collaterals are again noted. These results were called by telephone at the time of interpretation on 03/08/2024 at 2:26 p.m. to Dr. Consuelo Denmark, who verbally acknowledged these results. IMPRESSION: 1. Mild progression of extensive thrombus in the superior sagittal sinus posteriorly. 2. Mild improvement of subocclusive thrombus in the  transverse and sigmoid sinuses bilaterally. 3. Persistent bilateral cavernous sinus thrombosis. Electronically Signed   By: Aundra Lee M.D.   On: 03/08/2024 15:59   CT ANGIO HEAD NECK W WO CM W PERF (CODE STROKE) Result Date: 03/08/2024 CLINICAL DATA:  Neuro deficit, acute, stroke suspected. Recent history of extensive dural venous sinus thrombosis. EXAM: CT ANGIOGRAPHY HEAD AND NECK CT PERFUSION BRAIN TECHNIQUE: Multidetector CT imaging of the head and neck was performed using the standard protocol during bolus administration of intravenous contrast. Multiplanar CT image reconstructions and MIPs were obtained to evaluate the vascular anatomy. Carotid stenosis measurements (when applicable) are obtained utilizing NASCET criteria, using the distal internal carotid diameter as the denominator. Multiphase CT imaging of the brain was performed following IV bolus contrast injection. Subsequent parametric perfusion maps were calculated using RAPID software. RADIATION DOSE REDUCTION: This exam was performed according to the departmental dose-optimization program which includes automated exposure control, adjustment of the mA and/or kV  according to patient size and/or use of iterative reconstruction technique. CONTRAST:  OMNIPAQUE  IOHEXOL  350 MG/ML SOLN COMPARISON:  CTA head and neck 02/01/2024.  MRI head 03/02/2024. FINDINGS: CTA NECK FINDINGS Aortic arch: Standard branching. Wide patency of the brachiocephalic and subclavian arteries. Right carotid system: Patent and smooth without evidence of stenosis or dissection. Left carotid system: Patent and smooth without evidence of stenosis or dissection. Vertebral arteries: Patent and smooth without evidence of stenosis or dissection. Strongly dominant left vertebral artery. Skeleton: No acute osseous abnormality or suspicious lesion. Other neck: No evidence of cervical lymphadenopathy or mass. Upper chest: No mass or consolidation in the included lung apices. Review  of the MIP images confirms the above findings CTA HEAD FINDINGS Anterior circulation: The internal carotid arteries are widely patent from skull base to carotid termini. ACAs and MCAs are patent without evidence of a proximal branch occlusion or significant proximal stenosis. No aneurysm is identified. Posterior circulation: The intracranial vertebral arteries are patent with the left being strongly dominant. Patent PICA and SCA origins are visualized bilaterally. The basilar artery is widely patent. There are patent posterior communicating arteries bilaterally. Both PCAs are patent without evidence of a significant proximal stenosis. No aneurysm is identified. Venous sinuses: More fully evaluated on the separately reported dedicated CT venogram. Anatomic variants: None. Review of the MIP images confirms the above findings CT Brain Perfusion Findings: ASPECTS: 10 CBF (<30%) Volume: 0mL Perfusion (Tmax>6.0s) volume: 21mL, located in the parasagittal frontal lobes bilaterally and in the left cerebellar hemisphere Mismatch Volume: 21mL Infarction Location:No acute core infarct identified by CTP. IMPRESSION: 1. No emergent arterial occlusion or significant stenosis the head and neck. 2. 21 mL of prolonged Tmax in the parasagittal frontal lobes and left cerebellar hemisphere with subacute venous ischemia shown in the frontal lobes on recent MRI. No acute core infarct identified by CT perfusion. Electronically Signed   By: Aundra Lee M.D.   On: 03/08/2024 15:58   CT HEAD CODE STROKE WO CONTRAST Result Date: 03/08/2024 CLINICAL DATA:  Code stroke. Neuro deficit, acute, stroke suspected. Right-sided weakness. Recent history of extensive dural venous sinus thrombosis. EXAM: CT HEAD WITHOUT CONTRAST TECHNIQUE: Contiguous axial images were obtained from the base of the skull through the vertex without intravenous contrast. RADIATION DOSE REDUCTION: This exam was performed according to the departmental dose-optimization  program which includes automated exposure control, adjustment of the mA and/or kV according to patient size and/or use of iterative reconstruction technique. COMPARISON:  Head CT, CT venogram, and MRI 03/02/2024 FINDINGS: Brain: There is unchanged mild edema in the parasagittal posterior left frontal lobe corresponding to a known subacute infarct. No definite new infarct, intracranial hemorrhage, mass, midline shift, or extra-axial fluid collection is identified. The ventricles are unchanged in size. 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 Vascular: Known extensive dural venous sinus thrombosis, including hyperdense appearance of the posterior aspect of the superior sagittal sinus extending to the torcula. Skull: No acute fracture or suspicious lesion. Sinuses/Orbits: Paranasal sinuses and mastoid air cells are clear. Unremarkable orbits. Other: None. ASPECTS (Alberta Stroke Program Early CT Score) - Ganglionic level infarction (caudate, lentiform nuclei, internal capsule, insula, M1-M3 cortex): 7 - Supraganglionic infarction (M4-M6 cortex): 3 Total score (0-10 with 10 being normal): 10 These results were communicated to Dr. Christiane Cowing at 2:24 pm on 03/08/2024 by text page via the [facility] messaging system. IMPRESSION: 1. 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  2. No definite new infarct or intracranial hemorrhage. 3. ASPECTS of 10. Electronically Signed   By: Aundra Lee M.D.   On: 03/08/2024 14:24    Procedures Procedures    CRITICAL CARE Performed by: Marine Sia Ori Kreiter Total critical care time: 15 minutes Critical care time was exclusive of separately billable procedures and treating other patients. Critical care was necessary to treat or prevent imminent or life-threatening deterioration. Critical care was time spent personally by me on the following activities: development of treatment plan with patient  and/or surrogate as well as nursing, discussions with consultants, evaluation of patient's response to treatment, examination of patient, obtaining history from patient or surrogate, ordering and performing treatments and interventions, ordering and review of laboratory studies, ordering and review of radiographic studies, pulse oximetry and re-evaluation of patient's condition.  Medications Ordered in ED Medications  sodium chloride  flush (NS) 0.9 % injection 3 mL (3 mLs Intravenous Not Given 03/08/24 1758)  heparin  ADULT infusion 100 units/mL (25000 units/250mL) (800 Units/hr Intravenous New Bag/Given 03/08/24 1519)  atorvastatin  (LIPITOR ) tablet 80 mg (has no administration in time range)  butalbital -acetaminophen -caffeine  (FIORICET ) 50-325-40 MG per tablet 1 tablet (has no administration in time range)  ondansetron  (ZOFRAN ) tablet 4 mg (has no administration in time range)  pantoprazole  (PROTONIX ) EC tablet 40 mg (has no administration in time range)  topiramate  (TOPAMAX ) tablet 25 mg (has no administration in time range)  0.9 %  sodium chloride  infusion ( Intravenous New Bag/Given 03/08/24 1742)  sodium chloride  0.9 % bolus 1,000 mL (1,000 mLs Intravenous New Bag/Given 03/08/24 1627)  iohexol  (OMNIPAQUE ) 350 MG/ML injection 100 mL (100 mLs Intravenous Contrast Given 03/08/24 1440)    ED Course/ Medical Decision Making/ A&P                                 Medical Decision Making Amount and/or Complexity of Data Reviewed Labs: ordered. Radiology: ordered.  Risk Prescription drug management. Decision regarding hospitalization.    Josealberto Klett is a 33 y.o. male with a complex past medical history including deafness, recent encephalitis, strokes, and dural venous sinus thrombus who presents as a code stroke for right sided weakness, left gaze preference, and altered mental status.  According to EMS report, patient went to the ground overnight and laid on the ground for many hours.  He was  last normal at 4 PM yesterday.  he was unable to move his right side and is having weakness in the right arm and right leg.  He is deaf and uses sign language to interpret and speaks [place] otherwise.  Patient was afebrile on arrival but was slightly bradycardic.  Vital signs otherwise reassuring.  Patient did not have apnea episodes initially but per the EMS supervisor patient had several respiratory pauses of apnea.  The neurology team that saw the patient as a code stroke was involved in his care previously and said that he looks much worse than before.  Patient was occasionally able to nod yes and no when a [place] translated phrase was held up on a cell phone from the look at but otherwise patient was not able to do sign language initially.  Neurology requested the patient be admitted by critical care given the patient's ill appearance.  They will order heparin  for this suspected worsened dural sinus venous thrombosis.  Critical care was called.  3:33 PM Critical care came to the patient and they actually feel he does  not need ICU level care.  They feel this may relate to dehydration as well.  They will leave a note but recommended a progressive admitted by hospital service which we will all them about.         Final Clinical Impression(s) / ED Diagnoses Final diagnoses:  Right sided weakness   Clinical Impression: 1. Right sided weakness     Disposition: Admit  This note was prepared with assistance of Dragon voice recognition software. Occasional wrong-word or sound-a-like substitutions may have occurred due to the inherent limitations of voice recognition software.     Lavora Brisbon, Marine Sia, MD 03/08/24 [phone]

## 2024-03-08 NOTE — H&P (Addendum)
 History and Physical    Patient: Samuel Warren ZOX:[id number] DOB: 03-27-1991 DOA: 03/08/2024 DOS: the patient was seen and examined on 03/08/2024 PCP: Alto Atta, NP  Patient coming from: Home  Chief Complaint:  Chief Complaint  Patient presents with   Code Stroke   HPI: Samuel Warren is a 33 y.o. male with medical history significant of deafness, venous sinus thrombosis on eliquis , and meningoencephalitis presented to ED on 4/28 as a code stroke iso being found down w/ new onset R sided weakness.   Pt is a poor historian (NOTE: Pt is deaf and unable to use sign language at this time).  Per critical care provider's note, "pt had a witnessed fall at home 4/28 at 0200 by a person who reportedly identified self as wife. Pt remained on the floor until EMS was dispatched 4/28 afternoon for CC not eating. EMS noted R sided weakness, L gaze preference and pt was brought as code stroke. Reportedly pt had 2 apneic spell with EMS and required transient BVM.  CT H without acute abnormality. CTA and CTV were obtained and are not yet read but in d/w neuro it is felt that venous thrombus appear worse."  In the ED, pt noted to have worsening dural venous sinus thrombosis per Neurology read for which hep gtt has been initiated (NOTE: PTA pt on Eliquis  5mg  BID), and admitted to the progressive care unit for ongoing care.  Review of Systems: As mentioned in the history of present illness. All other systems reviewed and are negative.  Past Medical History:  Diagnosis Date   Deaf    Past Surgical History:  Procedure Laterality Date   IR ANGIO INTRA EXTRACRAN SEL COM CAROTID INNOMINATE BILAT MOD SED  02/02/2024   IR THROMBECT VENO MECH MOD SED  02/02/2024   IR VENO/JUGULAR LEFT  02/02/2024   RADIOLOGY WITH ANESTHESIA N/A 02/02/2024   Procedure: RADIOLOGY WITH ANESTHESIA;  Surgeon: Luellen Sages, MD;  Location: [facility] OR;  Service: Radiology;  Laterality: N/A;   Social History:  reports that he has  never smoked. He has never used smokeless tobacco. He reports that he does not currently use alcohol. He reports that he does not currently use drugs.  No Known Allergies  No family history on file.  Prior to Admission medications   Medication Sig Start Date End Date Taking? Authorizing Provider  apixaban  (ELIQUIS ) 5 MG TABS tablet Take 1 tablet (5 mg total) by mouth 2 (two) times daily. 02/11/24 03/12/24 Yes Modena Andes, MD  atorvastatin  (LIPITOR ) 80 MG tablet Take 1 tablet (80 mg total) by mouth daily. 02/10/24 03/11/24 Yes Modena Andes, MD  butalbital -acetaminophen -caffeine  (FIORICET ) 50-325-40 MG tablet Take 1 tablet by mouth every 6 (six) hours as needed (refractory HA.). 03/05/24  Yes Leona Rake, MD  ondansetron  (ZOFRAN ) 4 MG tablet Take 1 tablet (4 mg total) by mouth every 6 (six) hours as needed for nausea. 01/29/24  Yes Audria Leather, MD  pantoprazole  (PROTONIX ) 40 MG tablet Take 1 tablet (40 mg total) by mouth daily. Patient taking differently: Take 40 mg by mouth daily before breakfast. 03/05/24  Yes Adhikari, Corene Devonshire, MD  topiramate  (TOPAMAX ) 25 MG tablet Take 1 tablet (25 mg total) by mouth daily. 03/05/24  Yes Leona Rake, MD    Physical Exam: Vitals:   03/08/24 1435 03/08/24 1500 03/08/24 1511 03/08/24 1534  BP: 124/82 126/87    Pulse: (!) 50 (!) 52    Resp: 13 15    Temp: 98 F (36.7 C)  TempSrc: Oral     SpO2: 100% 100% 100% 100%  Weight:      Height:       General: NAD; unable to participate in exam Respiratory: Lungs clear to auscultation bilaterally with normal respiratory effort; no w/r/r Cardiovascular: Regular rate and rhythm w/o m/r/g  Data Reviewed:  Lab Results  Component Value Date   WBC 6.7 03/02/2024   HGB 13.6 03/08/2024   HCT 40.0 03/08/2024   MCV 84.6 03/02/2024   PLT 229 03/02/2024   Lab Results  Component Value Date   GLUCOSE 111 (H) 03/08/2024   CALCIUM  9.2 03/04/2024   NA 144 03/08/2024   K 3.6 03/08/2024   CO2 18 (L) 03/04/2024    CL 115 (H) 03/08/2024   BUN 7 03/08/2024   CREATININE 0.80 03/08/2024   Lab Results  Component Value Date   ALT 54 (H) 03/02/2024   AST 30 03/02/2024   ALKPHOS 51 03/02/2024   BILITOT 0.7 03/02/2024   Lab Results  Component Value Date   INR 1.4 (H) 03/02/2024   INR 1.0 02/01/2024    Radiology: CT VENOGRAM HEAD Result Date: 03/08/2024 CLINICAL DATA:  Dural venous sinus thrombosis. Right-sided weakness. EXAM: CT VENOGRAM HEAD TECHNIQUE: Venographic phase images of the brain were obtained following the administration of intravenous contrast. Multiplanar reformats and maximum intensity projections were generated. RADIATION DOSE REDUCTION: This exam was performed according to the departmental dose-optimization program which includes automated exposure control, adjustment of the mA and/or kV according to patient size and/or use of iterative reconstruction technique. CONTRAST:  OMNIPAQUE  IOHEXOL  350 MG/ML SOLN COMPARISON:  CT venogram 03/02/2024 FINDINGS: Extensive mixed occlusive and subocclusive thrombus in the posterior aspect of the superior sagittal sinus extending to the torcula has mildly progressed from the prior CT venogram. Subocclusive thrombus in the transverse and sigmoid sinuses bilaterally is overall mildly improved. 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 The straight sinus, inferior sagittal sinus, vein of Galen, and internal cerebral veins are patent. Extensive venous collaterals are again noted. These results were called by telephone at the time of interpretation on 03/08/2024 at 2:26 p.m. to Dr. Consuelo Denmark, who verbally acknowledged these results. IMPRESSION: 1. Mild progression of extensive thrombus in the superior sagittal sinus posteriorly. 2. Mild improvement of subocclusive thrombus in the transverse and sigmoid sinuses bilaterally. 3. Persistent bilateral cavernous sinus thrombosis. Electronically Signed   By: Aundra Lee M.D.   On: 03/08/2024 15:59    CT ANGIO HEAD NECK W WO CM W PERF (CODE STROKE) Result Date: 03/08/2024 CLINICAL DATA:  Neuro deficit, acute, stroke suspected. Recent history of extensive dural venous sinus thrombosis. EXAM: CT ANGIOGRAPHY HEAD AND NECK CT PERFUSION BRAIN TECHNIQUE: Multidetector CT imaging of the head and neck was performed using the standard protocol during bolus administration of intravenous contrast. Multiplanar CT image reconstructions and MIPs were obtained to evaluate the vascular anatomy. Carotid stenosis measurements (when applicable) are obtained utilizing NASCET criteria, using the distal internal carotid diameter as the denominator. Multiphase CT imaging of the brain was performed following IV bolus contrast injection. Subsequent parametric perfusion maps were calculated using RAPID software. RADIATION DOSE REDUCTION: This exam was performed according to the departmental dose-optimization program which includes automated exposure control, adjustment of the mA and/or kV according to patient size and/or use of iterative reconstruction technique. CONTRAST:  OMNIPAQUE  IOHEXOL  350 MG/ML SOLN COMPARISON:  CTA head and neck 02/01/2024.  MRI head 03/02/2024. FINDINGS: CTA NECK FINDINGS Aortic arch: Standard branching. Wide patency of  the brachiocephalic and subclavian arteries. Right carotid system: Patent and smooth without evidence of stenosis or dissection. Left carotid system: Patent and smooth without evidence of stenosis or dissection. Vertebral arteries: Patent and smooth without evidence of stenosis or dissection. Strongly dominant left vertebral artery. Skeleton: No acute osseous abnormality or suspicious lesion. Other neck: No evidence of cervical lymphadenopathy or mass. Upper chest: No mass or consolidation in the included lung apices. Review of the MIP images confirms the above findings CTA HEAD FINDINGS Anterior circulation: The internal carotid arteries are widely patent from skull base to carotid  termini. ACAs and MCAs are patent without evidence of a proximal branch occlusion or significant proximal stenosis. No aneurysm is identified. Posterior circulation: The intracranial vertebral arteries are patent with the left being strongly dominant. Patent PICA and SCA origins are visualized bilaterally. The basilar artery is widely patent. There are patent posterior communicating arteries bilaterally. Both PCAs are patent without evidence of a significant proximal stenosis. No aneurysm is identified. Venous sinuses: More fully evaluated on the separately reported dedicated CT venogram. Anatomic variants: None. Review of the MIP images confirms the above findings CT Brain Perfusion Findings: ASPECTS: 10 CBF (<30%) Volume: 0mL Perfusion (Tmax>6.0s) volume: 21mL, located in the parasagittal frontal lobes bilaterally and in the left cerebellar hemisphere Mismatch Volume: 21mL Infarction Location:No acute core infarct identified by CTP. IMPRESSION: 1. No emergent arterial occlusion or significant stenosis the head and neck. 2. 21 mL of prolonged Tmax in the parasagittal frontal lobes and left cerebellar hemisphere with subacute venous ischemia shown in the frontal lobes on recent MRI. No acute core infarct identified by CT perfusion. Electronically Signed   By: Aundra Lee M.D.   On: 03/08/2024 15:58   CT HEAD CODE STROKE WO CONTRAST Result Date: 03/08/2024 CLINICAL DATA:  Code stroke. Neuro deficit, acute, stroke suspected. Right-sided weakness. Recent history of extensive dural venous sinus thrombosis. EXAM: CT HEAD WITHOUT CONTRAST TECHNIQUE: Contiguous axial images were obtained from the base of the skull through the vertex without intravenous contrast. RADIATION DOSE REDUCTION: This exam was performed according to the departmental dose-optimization program which includes automated exposure control, adjustment of the mA and/or kV according to patient size and/or use of iterative reconstruction technique.  COMPARISON:  Head CT, CT venogram, and MRI 03/02/2024 FINDINGS: Brain: There is unchanged mild edema in the parasagittal posterior left frontal lobe corresponding to a known subacute infarct. No definite new infarct, intracranial hemorrhage, mass, midline shift, or extra-axial fluid collection is identified. The ventricles are unchanged in size. 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 Vascular: Known extensive dural venous sinus thrombosis, including hyperdense appearance of the posterior aspect of the superior sagittal sinus extending to the torcula. Skull: No acute fracture or suspicious lesion. Sinuses/Orbits: Paranasal sinuses and mastoid air cells are clear. Unremarkable orbits. Other: None. ASPECTS (Alberta Stroke Program Early CT Score) - Ganglionic level infarction (caudate, lentiform nuclei, internal capsule, insula, M1-M3 cortex): 7 - Supraganglionic infarction (M4-M6 cortex): 3 Total score (0-10 with 10 being normal): 10 These results were communicated to Dr. Christiane Cowing at 2:24 pm on 03/08/2024 by text page via the [facility] messaging system. IMPRESSION: 1. 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 2. No definite new infarct or intracranial hemorrhage. 3. ASPECTS of 10. Electronically Signed   By: Aundra Lee M.D.   On: 03/08/2024 14:24   Assessment and Plan: Samuel Warren is a 33 y.o. male with medical  history significant of deafness, venous sinus thrombosis on eliquis , and meningoencephalitis presented to ED on 4/28 as a code stroke iso being found down w/ new onset R sided weakness and c/f worsening dural venous thrombosis despite OAC.  #R sided weakness and c/f worsening dural venous thrombosis despite OAC, new, poa #Presumed CVA, new, poa #Dural venous thrombosis, not improving as anticipated, poa -SLP consulted; apprec eval -Neurology consulted; recs: continue hep gtt per protocol; f/u EEG; NS at 100cc/h  after 1L bolus; stroke team to follow -F/u procalcitonin and blood cultures to exclude meningoencephalitis (low likelihood; will defer abx for now)   Advance Care Planning:   Code Status: Full Code   Consults: Neurology  Family Communication: N/A  Severity of Illness: The appropriate patient status for this patient is INPATIENT. Inpatient status is judged to be reasonable and necessary in order to provide the required intensity of service to ensure the patient's safety. The patient's presenting symptoms, physical exam findings, and initial radiographic and laboratory data in the context of their chronic comorbidities is felt to place them at high risk for further clinical deterioration. Furthermore, it is not anticipated that the patient will be medically stable for discharge from the hospital within 2 midnights of admission.   * I certify that at the point of admission it is my clinical judgment that the patient will require inpatient hospital care spanning beyond 2 midnights from the point of admission due to high intensity of service, high risk for further deterioration and high frequency of surveillance required.*  Author: Arne Langdon, MD 03/08/2024 3:59 PM  For on call review [email or web address].

## 2024-03-08 NOTE — Progress Notes (Signed)
 PHARMACY - ANTICOAGULATION CONSULT NOTE  Pharmacy Consult for heparin  Indication:  Venous sinus thrombosis   No Known Allergies  Patient Measurements: Height: 5\' 4"  (162.6 cm) Weight: 65 kg (143 lb 4.8 oz) IBW/kg (Calculated) : 59.2 HEPARIN  DW (KG): 65  Vital Signs: Temp: 98.7 F (37.1 C) (04/28 1951) Temp Source: Axillary (04/28 1951) BP: 131/82 (04/28 1951) Pulse Rate: 53 (04/28 1951)  Labs: Recent Labs    03/08/24 1403 03/08/24 1829 03/08/24 2117  HGB 13.6 14.6  --   HCT 40.0 44.4  --   PLT  --  230  --   APTT  --  47* 56*  LABPROT  --  17.1*  --   INR  --  1.4*  --   HEPARINUNFRC  --   --  >1.10*  CREATININE 0.80 1.09  --   CKTOTAL  --  262  --     Estimated Creatinine Clearance: 80 mL/min (by C-G formula based on SCr of 1.09 mg/dL).   Medical History: Past Medical History:  Diagnosis Date   Deaf      Assessment: 27 yoM recently admitted with venous sinus thrombosis. Discharged home on Eliquis  (unknown last dose (pt is deaf and family is non-english speaking)), presenting to the ED with worsening right sided weakness. CT shows extension of the previous thrombus. Neurology has asked to start heparin  infusion without bolus.   Heparin  level this evening is falsely elevated from recent Eliquis , but aPTT slightly below goal.  No known issues with IV infusion, no overt bleeding or complications noted.  Goal of Therapy:  Heparin  level 0.3-0.5 units/ml aPTT: 66-85 seconds Monitor platelets by anticoagulation protocol: Yes   Plan:  Increase IV heparin  to 900 units/hr. Check anti-Xa level in 8 hours and daily while on heparin  Continue to monitor H&H and platelets  Joanell Mowers, Catheryn Cluck, BCPS, BCCP Clinical Pharmacist  03/08/2024 10:20 PM   [facility] pharmacy phone numbers are listed on [email or web address]

## 2024-03-09 ENCOUNTER — Inpatient Hospital Stay (HOSPITAL_COMMUNITY)

## 2024-03-09 ENCOUNTER — Encounter (HOSPITAL_COMMUNITY): Payer: Self-pay

## 2024-03-09 ENCOUNTER — Ambulatory Visit: Admitting: Physical Therapy

## 2024-03-09 ENCOUNTER — Ambulatory Visit: Admitting: Occupational Therapy

## 2024-03-09 ENCOUNTER — Encounter (HOSPITAL_COMMUNITY): Payer: Self-pay | Admitting: Hospitalist

## 2024-03-09 ENCOUNTER — Ambulatory Visit: Admitting: Internal Medicine

## 2024-03-09 ENCOUNTER — Encounter: Admitting: Occupational Therapy

## 2024-03-09 ENCOUNTER — Encounter (HOSPITAL_COMMUNITY)

## 2024-03-09 ENCOUNTER — Encounter (HOSPITAL_COMMUNITY)
Admission: EM | Disposition: A | Discharge status: Rehabilitation Facility | Payer: Self-pay | Source: Home / Self Care | Attending: Internal Medicine

## 2024-03-09 DIAGNOSIS — I6529 Occlusion and stenosis of unspecified carotid artery: Secondary | ICD-10-CM

## 2024-03-09 DIAGNOSIS — E785 Hyperlipidemia, unspecified: Secondary | ICD-10-CM | POA: Diagnosis not present

## 2024-03-09 DIAGNOSIS — G936 Cerebral edema: Secondary | ICD-10-CM | POA: Diagnosis not present

## 2024-03-09 DIAGNOSIS — R569 Unspecified convulsions: Secondary | ICD-10-CM

## 2024-03-09 DIAGNOSIS — G08 Intracranial and intraspinal phlebitis and thrombophlebitis: Secondary | ICD-10-CM | POA: Diagnosis not present

## 2024-03-09 DIAGNOSIS — R531 Weakness: Secondary | ICD-10-CM

## 2024-03-09 DIAGNOSIS — I676 Nonpyogenic thrombosis of intracranial venous system: Secondary | ICD-10-CM | POA: Diagnosis not present

## 2024-03-09 DIAGNOSIS — R1310 Dysphagia, unspecified: Secondary | ICD-10-CM | POA: Diagnosis not present

## 2024-03-09 DIAGNOSIS — Z8673 Personal history of transient ischemic attack (TIA), and cerebral infarction without residual deficits: Secondary | ICD-10-CM

## 2024-03-09 DIAGNOSIS — G9340 Encephalopathy, unspecified: Secondary | ICD-10-CM | POA: Diagnosis not present

## 2024-03-09 DIAGNOSIS — R464 Slowness and poor responsiveness: Secondary | ICD-10-CM

## 2024-03-09 HISTORY — PX: RADIOLOGY WITH ANESTHESIA: SHX6223

## 2024-03-09 HISTORY — PX: IR CT HEAD LTD: IMG2386

## 2024-03-09 HISTORY — PX: IR THROMBECT VENO MECH MOD SED: IMG2300

## 2024-03-09 HISTORY — PX: IR VENO SAGITTAL SINUS: IMG687

## 2024-03-09 HISTORY — PX: IR ANGIO INTRA EXTRACRAN SEL COM CAROTID INNOMINATE BILAT MOD SED: IMG5360

## 2024-03-09 LAB — BLOOD GAS, ARTERIAL
Acid-base deficit: 4.8 mmol/L — ABNORMAL HIGH (ref 0.0–2.0)
Bicarbonate: 17.7 mmol/L — ABNORMAL LOW (ref 20.0–28.0)
Drawn by: 548791
O2 Saturation: 99.5 %
Patient temperature: 36.8
pCO2 arterial: 26 mmHg — ABNORMAL LOW (ref 32–48)
pH, Arterial: 7.44 (ref 7.35–7.45)
pO2, Arterial: 100 mmHg (ref 83–108)

## 2024-03-09 LAB — URINALYSIS, ROUTINE W REFLEX MICROSCOPIC
Bilirubin Urine: NEGATIVE
Glucose, UA: 50 mg/dL — AB
Hgb urine dipstick: NEGATIVE
Ketones, ur: 20 mg/dL — AB
Leukocytes,Ua: NEGATIVE
Nitrite: NEGATIVE
Protein, ur: NEGATIVE mg/dL
Specific Gravity, Urine: 1.009 (ref 1.005–1.030)
pH: 6 (ref 5.0–8.0)

## 2024-03-09 LAB — GLUCOSE, CAPILLARY
Glucose-Capillary: 158 mg/dL — ABNORMAL HIGH (ref 70–99)
Glucose-Capillary: 160 mg/dL — ABNORMAL HIGH (ref 70–99)
Glucose-Capillary: 164 mg/dL — ABNORMAL HIGH (ref 70–99)
Glucose-Capillary: 167 mg/dL — ABNORMAL HIGH (ref 70–99)

## 2024-03-09 LAB — MRSA NEXT GEN BY PCR, NASAL: MRSA by PCR Next Gen: NOT DETECTED

## 2024-03-09 LAB — SODIUM: Sodium: 136 mmol/L (ref 135–145)

## 2024-03-09 LAB — FOLATE: Folate: 13 ng/mL (ref >5.9)

## 2024-03-09 LAB — CBC
HCT: 41.7 % (ref 39.0–52.0)
Hemoglobin: 13.8 g/dL (ref 13.0–17.0)
MCH: 26.6 pg (ref 26.0–34.0)
MCHC: 33.1 g/dL (ref 30.0–36.0)
MCV: 80.5 fL (ref 80.0–100.0)
Platelets: 245 10*3/uL (ref 150–400)
RBC: 5.18 MIL/uL (ref 4.22–5.81)
RDW: 12.2 % (ref 11.5–15.5)
WBC: 10.3 10*3/uL (ref 4.0–10.5)
nRBC: 0 % (ref 0.0–0.2)

## 2024-03-09 LAB — VITAMIN B12: Vitamin B-12: 1132 pg/mL — ABNORMAL HIGH (ref 180–914)

## 2024-03-09 LAB — APTT
aPTT: 77 s — ABNORMAL HIGH (ref 24–36)
aPTT: 74 s — ABNORMAL HIGH (ref 24–36)

## 2024-03-09 LAB — TSH: TSH: 0.997 u[IU]/mL (ref 0.350–4.500)

## 2024-03-09 LAB — AMMONIA: Ammonia: 26 umol/L (ref 9–35)

## 2024-03-09 LAB — HEPARIN LEVEL (UNFRACTIONATED): Heparin Unfractionated: >1.1 [IU]/mL — ABNORMAL HIGH (ref 0.30–0.70)

## 2024-03-09 SURGERY — RADIOLOGY WITH ANESTHESIA
Anesthesia: General

## 2024-03-09 MED ORDER — FENTANYL CITRATE (PF) 100 MCG/2ML IJ SOLN
INTRAMUSCULAR | Status: AC
  Filled 2024-03-09: qty 2

## 2024-03-09 MED ORDER — PHENYLEPHRINE 80 MCG/ML (10ML) SYRINGE FOR IV PUSH (FOR BLOOD PRESSURE SUPPORT)
PREFILLED_SYRINGE | INTRAVENOUS | Status: DC | PRN
  Administered 2024-03-09: 80 ug via INTRAVENOUS

## 2024-03-09 MED ORDER — NITROGLYCERIN 1 MG/10 ML FOR IR/CATH LAB
INTRA_ARTERIAL | Status: AC
  Filled 2024-03-09: qty 10

## 2024-03-09 MED ORDER — FENTANYL CITRATE PF 50 MCG/ML IJ SOSY
25.0000 ug | PREFILLED_SYRINGE | INTRAMUSCULAR | Status: AC | PRN
  Administered 2024-03-11 – 2024-03-12 (×2): 25 ug via INTRAVENOUS
  Administered 2024-03-12: 50 ug via INTRAVENOUS
  Administered 2024-03-13 (×2): 25 ug via INTRAVENOUS
  Administered 2024-03-13: 50 ug via INTRAVENOUS
  Filled 2024-03-09 (×6): qty 1

## 2024-03-09 MED ORDER — PROPOFOL 500 MG/50ML IV EMUL
INTRAVENOUS | Status: DC | PRN
  Administered 2024-03-09: 75 ug/kg/min via INTRAVENOUS

## 2024-03-09 MED ORDER — CLEVIDIPINE BUTYRATE 0.5 MG/ML IV EMUL: 0.0000 mg/h | INTRAVENOUS | Status: DC

## 2024-03-09 MED ORDER — ONDANSETRON HCL 4 MG/2ML IJ SOLN
INTRAMUSCULAR | Status: DC | PRN
  Administered 2024-03-09: 4 mg via INTRAVENOUS

## 2024-03-09 MED ORDER — ACETAMINOPHEN 650 MG RE SUPP: 650.0000 mg | RECTAL | Status: DC | PRN

## 2024-03-09 MED ORDER — SODIUM CHLORIDE 0.9 % IV SOLN: INTRAVENOUS | Status: DC | PRN

## 2024-03-09 MED ORDER — SUGAMMADEX SODIUM 200 MG/2ML IV SOLN
INTRAVENOUS | Status: DC | PRN
  Administered 2024-03-09: 250 mg via INTRAVENOUS

## 2024-03-09 MED ORDER — ACETAMINOPHEN 325 MG PO TABS
650.0000 mg | ORAL_TABLET | ORAL | Status: DC | PRN
  Administered 2024-03-16 – 2024-03-18 (×3): 650 mg via ORAL
  Filled 2024-03-09 (×3): qty 2

## 2024-03-09 MED ORDER — DEXAMETHASONE SODIUM PHOSPHATE 10 MG/ML IJ SOLN
INTRAMUSCULAR | Status: DC | PRN
Start: 2024-03-09 — End: 2024-03-09
  Administered 2024-03-09: 10 mg via INTRAVENOUS

## 2024-03-09 MED ORDER — ACETAMINOPHEN 160 MG/5ML PO SOLN: 650.0000 mg | ORAL | Status: DC | PRN

## 2024-03-09 MED ORDER — PROPOFOL 10 MG/ML IV BOLUS
INTRAVENOUS | Status: DC | PRN
Start: 2024-03-09 — End: 2024-03-09
  Administered 2024-03-09: 100 mg via INTRAVENOUS

## 2024-03-09 MED ORDER — LACTATED RINGERS IV SOLN: INTRAVENOUS | Status: DC | PRN

## 2024-03-09 MED ORDER — FENTANYL CITRATE (PF) 100 MCG/2ML IJ SOLN
INTRAMUSCULAR | Status: DC | PRN
  Administered 2024-03-09: 100 ug via INTRAVENOUS

## 2024-03-09 MED ORDER — SODIUM CHLORIDE 0.9 % IV SOLN: INTRAVENOUS | Status: DC

## 2024-03-09 MED ORDER — PHENYLEPHRINE HCL-NACL 20-0.9 MG/250ML-% IV SOLN
INTRAVENOUS | Status: DC | PRN
  Administered 2024-03-09: 15 ug/min via INTRAVENOUS

## 2024-03-09 MED ORDER — LIDOCAINE 2% (20 MG/ML) 5 ML SYRINGE
INTRAMUSCULAR | Status: DC | PRN
  Administered 2024-03-09: 60 mg via INTRAVENOUS

## 2024-03-09 MED ORDER — CHLORHEXIDINE GLUCONATE CLOTH 2 % EX PADS
6.0000 | MEDICATED_PAD | Freq: Every day | CUTANEOUS | Status: DC
  Administered 2024-03-10 – 2024-03-17 (×7): 6 via TOPICAL

## 2024-03-09 MED ORDER — SODIUM CHLORIDE 3 % IV BOLUS
250.0000 mL | Freq: Once | INTRAVENOUS | Status: AC
  Administered 2024-03-09: 250 mL via INTRAVENOUS
  Filled 2024-03-09: qty 500

## 2024-03-09 MED ORDER — HEPARIN SODIUM (PORCINE) 1000 UNIT/ML IJ SOLN
INTRAMUSCULAR | Status: DC | PRN
  Administered 2024-03-09: 2000 [IU] via INTRAVENOUS

## 2024-03-09 MED ORDER — VECURONIUM BROMIDE 10 MG IV SOLR
INTRAVENOUS | Status: DC | PRN
  Administered 2024-03-09 (×3): 2 mg via INTRAVENOUS

## 2024-03-09 MED ORDER — ROCURONIUM BROMIDE 10 MG/ML (PF) SYRINGE
PREFILLED_SYRINGE | INTRAVENOUS | Status: DC | PRN
  Administered 2024-03-09: 70 mg via INTRAVENOUS
  Administered 2024-03-09: 30 mg via INTRAVENOUS

## 2024-03-09 MED ORDER — IOHEXOL 300 MG/ML  SOLN: 150.0000 mL | Freq: Once | INTRAMUSCULAR | Status: DC | PRN

## 2024-03-09 MED ORDER — SODIUM CHLORIDE 3 % IV SOLN
INTRAVENOUS | Status: DC
  Filled 2024-03-09 (×11): qty 500

## 2024-03-09 NOTE — Progress Notes (Signed)
 Techs completed LTM hook up but had to remove wires because patient is going down for a thrombectomy. Patient will be down in OR for about 3-4 hours. Will let RN know to message as soon as he gets back.

## 2024-03-09 NOTE — Procedures (Signed)
 Patient Name: Samuel Warren  MRN: [id number]  Epilepsy Attending: Arleene Lack  Referring Physician/Provider: Laymond Priestly, NP  Date: 03/08/2024 Duration: 22.15 mins  Patient history: 33yo M with new onset R sided weakness. EEG to evaluate for seizure  Level of alertness: Awake  AEDs during EEG study: TPM  Technical aspects: This EEG study was done with scalp electrodes positioned according to the 10-20 International system of electrode placement. Electrical activity was reviewed with band pass filter of 1-70Hz , sensitivity of 7 uV/mm, display speed of 38mm/sec with a 60Hz  notched filter applied as appropriate. EEG data were recorded continuously and digitally stored.  Video monitoring was available and reviewed as appropriate.  Description: The posterior dominant rhythm consists of 8 Hz activity of moderate voltage (25-35 uV) seen predominantly in posterior head regions, asymmetric ( left<right) and reactive to eye opening and eye closing. EEG showed continuous polymorphic 3 to 6 Hz theta-delta slowing in left hemisphere as well as intermittent generalized 3-5hz  theta-delta slowing.Hyperventilation and photic stimulation were not performed.     ABNORMALITY - Continuous slow, left hemisphere - Intermittent slow, generalized - Background asymmetry, left<right   IMPRESSION: This study is suggestive of cortical dysfunction arising from left hemisphere likely secondary to underlying structural abnormality. Additionally there is mild to moderate diffuse encephalopathy. No seizures or epileptiform discharges were seen throughout the recording.  Dail Meece O Amanada Philbrick

## 2024-03-09 NOTE — Consult Note (Signed)
 Chief Complaint: Patient was seen in consultation today for dural venous sinus thrombosis   at the request of Consuelo Denmark, MD  Referring Physician(s): Consuelo Denmark, MD  Supervising Physician: Luellen Sages  Patient Status: [facility] - In-pt  Full Code  History of Present Illness: Samuel Warren is a 33 y.o. 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 Patient presented to the ER 4/28 and code stroke activated. MRI brain performed 4/28, impression below.  MRI brain 03/08/24    Patient's brother and wife are at the bedside. Patient's brother reports that the patient has complained about head pain recently. History is limited due to patient's current clinical status. IR consulted by Neurology to assist with thrombectomy of dural sinus.   Past Medical History:  Diagnosis Date   Deaf     Past Surgical History:  Procedure Laterality Date   IR ANGIO INTRA EXTRACRAN SEL COM CAROTID INNOMINATE BILAT MOD SED  02/02/2024   IR THROMBECT VENO MECH MOD SED  02/02/2024   IR VENO/JUGULAR LEFT  02/02/2024   RADIOLOGY WITH ANESTHESIA N/A 02/02/2024   Procedure: RADIOLOGY WITH ANESTHESIA;  Surgeon: Luellen Sages, MD;  Location: [facility] OR;  Service: Radiology;  Laterality: N/A;    Allergies: Patient has no known allergies.  Medications: Prior to Admission medications   Medication Sig Start Date End Date Taking? Authorizing Provider  apixaban  (ELIQUIS ) 5 MG TABS tablet Take 1 tablet (5 mg total) by mouth 2 (two) times daily. 02/11/24 03/12/24 Yes Modena Andes, MD  atorvastatin  (LIPITOR ) 80 MG tablet Take 1 tablet (80 mg total) by mouth daily. 02/10/24 03/11/24 Yes Modena Andes, MD  butalbital -acetaminophen -caffeine  (FIORICET ) 50-325-40 MG tablet Take 1 tablet by mouth every 6 (six) hours as needed (refractory HA.). 03/05/24  Yes Leona Rake, MD  ondansetron  (ZOFRAN ) 4 MG tablet Take 1 tablet (4 mg total) by mouth every 6 (six) hours as needed for nausea. 01/29/24   Yes Audria Leather, MD  pantoprazole  (PROTONIX ) 40 MG tablet Take 1 tablet (40 mg total) by mouth daily. Patient taking differently: Take 40 mg by mouth daily before breakfast. 03/05/24  Yes Leona Rake, MD  topiramate  (TOPAMAX ) 25 MG tablet Take 1 tablet (25 mg total) by mouth daily. 03/05/24  Yes Leona Rake, MD     History reviewed. No pertinent family history.  Social History   Socioeconomic History   Marital status: Married    Spouse name: Not on file   Number of children: Not on file   Years of education: Not on file   Highest education level: Not on file  Occupational History   Not on file  Tobacco Use   Smoking status: Never   Smokeless tobacco: Never  Substance and Sexual Activity   Alcohol use: Not Currently   Drug use: Not Currently   Sexual activity: Not on file  Other Topics Concern   Not on file  Social History Narrative   ** Merged History Encounter **       Social Drivers of Health   Financial Resource Strain: Not on file  Food Insecurity: No Food Insecurity (03/08/2024)   Hunger Vital Sign    Worried About Running Out of Food in the Last Year: Never true    Ran Out of Food in the Last Year: Never true  Transportation Needs: No Transportation Needs (03/08/2024)   PRAPARE - Administrator, Civil Service (Medical): No    Lack of Transportation (Non-Medical): No  Physical Activity: Not on file  Stress:  Not on file  Social Connections: Patient Declined (02/08/2024)   Social Connection and Isolation Panel [NHANES]    Frequency of Communication with Friends and Family: Patient declined    Frequency of Social Gatherings with Friends and Family: Patient declined    Attends Religious Services: Patient declined    Database administrator or Organizations: Patient declined    Attends Banker Meetings: Patient declined    Marital Status: Patient declined     Review of Systems: A 12 point ROS discussed and pertinent positives are  indicated in the HPI above.  All other systems are negative.  Review of Systems  Reason unable to perform ROS: not oriented/alert.    Vital Signs: BP 125/83   Pulse 65   Temp (!) 97.5 F (36.4 C) (Oral)   Resp 17   Ht 5\' 4"  (1.626 m)   Wt 143 lb 4.8 oz (65 kg)   SpO2 93%   BMI 24.60 kg/m     Physical Exam HENT:     Head: Normocephalic and atraumatic.  Cardiovascular:     Rate and Rhythm: Normal rate and regular rhythm.  Pulmonary:     Effort: Pulmonary effort is normal.     Breath sounds: Normal breath sounds.  Abdominal:     General: Abdomen is flat.  Skin:    General: Skin is warm.  Psychiatric:        Mood and Affect: Mood normal.     Imaging: DG CHEST PORT 1 VIEW Result Date: 03/09/2024 EXAM: 1 VIEW(S) XRAY OF THE CHEST 03/09/2024 10:49:20 AM COMPARISON: None available. CLINICAL HISTORY: 141835 Weakness 141835. Stroke, weakness; rover Stroke, weakness; rover FINDINGS: LUNGS AND PLEURA: No consolidation. No pulmonary edema. No pleural effusion. No pneumothorax. HEART AND MEDIASTINUM: No acute abnormality of the cardiac and mediastinal silhouettes. BONES AND SOFT TISSUES: No acute osseous abnormality. IMPRESSION: 1. No acute process. Electronically signed by: [email or web address] 03/09/2024 02:47 PM EDT RP Workstation: [id number]   CT HEAD WO CONTRAST ( ) Result Date: 03/09/2024 CLINICAL DATA:  Stroke.  Follow-up.  Dural venous sinus thrombosis. EXAM: CT HEAD WITHOUT CONTRAST TECHNIQUE: Contiguous axial images were obtained from the base of the skull through the vertex without intravenous contrast. RADIATION DOSE REDUCTION: This exam was performed according to the departmental dose-optimization program which includes automated exposure control, adjustment of the mA and/or kV according to patient size and/or use of iterative reconstruction technique. COMPARISON:  CT and MRI studies done yesterday. FINDINGS: Brain: Generalized brain swelling. Focal low density at the frontoparietal  vertices, left more than right could relate to venous infarctions. Focal low-density in the left occipital lobe could be subtle evidence of some early venous ischemic changes in that region. No intraparenchymal hemorrhage. No hydrocephalus or extra-axial collection. Vascular: Some areas of persistent hyperdensity in the venous sinuses consistent with the known venous thrombotic disease. Skull: Negative Sinuses/Orbits: Clear/normal Other: None IMPRESSION: 1. Generalized brain swelling. Focal low density at the frontoparietal vertices, left more than right, could relate to venous infarctions. Focal low-density in the left occipital lobe could be subtle evidence of some early venous ischemic changes in that region. No intraparenchymal hemorrhage. 2. Some areas of persistent hyperdensity in the venous sinuses consistent with the known venous thrombotic disease. Electronically Signed   By: Bettylou Brunner M.D.   On: 03/09/2024 14:10   EEG adult Result Date: 03/09/2024 Arleene Lack, MD     03/09/2024  8:35 AM Patient Name: Samuel Warren MRN: [id number] Epilepsy Attending: Priyanka O Yadav  Referring Physician/Provider: Laymond Priestly, NP Date: 03/08/2024 Duration: 22.15 mins Patient history: 33yo M with new onset R sided weakness. EEG to evaluate for seizure Level of alertness: Awake AEDs during EEG study: TPM Technical aspects: This EEG study was done with scalp electrodes positioned according to the 10-20 International system of electrode placement. Electrical activity was reviewed with band pass filter of 1-70Hz , sensitivity of 7 uV/mm, display speed of 26mm/sec with a 60Hz  notched filter applied as appropriate. EEG data were recorded continuously and digitally stored.  Video monitoring was available and reviewed as appropriate. Description: The posterior dominant rhythm consists of 8 Hz activity of moderate voltage (25-35 uV) seen predominantly in posterior head regions, asymmetric ( left<right) and reactive to eye  opening and eye closing. EEG showed continuous polymorphic 3 to 6 Hz theta-delta slowing in left hemisphere as well as intermittent generalized 3-5hz  theta-delta slowing.Hyperventilation and photic stimulation were not performed.   ABNORMALITY - Continuous slow, left hemisphere - Intermittent slow, generalized - Background asymmetry, left<right IMPRESSION: This study is suggestive of cortical dysfunction arising from left hemisphere likely secondary to underlying structural abnormality. Additionally there is mild to moderate diffuse encephalopathy. No seizures or epileptiform discharges were seen throughout the recording. Arleene Lack   MR BRAIN W WO CONTRAST Result Date: 03/09/2024 CLINICAL DATA:  Follow-up examination for dural venous sinus thrombosis. EXAM: MRI HEAD WITHOUT AND WITH CONTRAST TECHNIQUE: Multiplanar, multiecho pulse sequences of the brain and surrounding structures were obtained without and with intravenous contrast. CONTRAST:  7mL GADAVIST  GADOBUTROL  1 MMOL/ML IV SOLN COMPARISON:  Comparison made with CT venogram performed on the same day as well as multiple previous studies. FINDINGS: Brain: Cerebral volume within normal limits for age. Residual mild diffusion signal abnormality involving the parasagittal perirolandic regions again seen (series 5, images 96-88). Overall, appearance and distribution as compared to previous MRI from 03/02/2024 is not significantly changed or progressed. Mild associated T2/FLAIR signal intensity without ADC correlate. 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 Mild associated petechial blood hemorrhage at the posterior left frontal region (series 12, image 51), stable from prior. No frank hemorrhagic transformation or significant mass effect. Additional subtle diffusion signal abnormality involving the left occipital region (series 5, image 73), also likely evolving subtle venous ischemic changes, not significantly changed in retrospect.  No other new areas of acute or evolving ischemia. No other acute intracranial hemorrhage. 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 Preponderance of clot present within the posterior aspect of the superior sagittal sinus, with additional clot in the left transverse and sigmoid sinuses. Subocclusive clot within the right transverse sinus. Probable cavernous sinus thromboses better seen on prior exam. No mass lesion or midline shift. Stable ventricular size and morphology. No extra-axial fluid collection. Pituitary gland within normal limits. No other pathologic enhancement. Vascular: Persistent dural venous sinus thrombosis as above. Major intracranial arterial vascular flow voids are maintained. Skull and upper cervical spine: Cerebellar tonsils are low lying extending up to 9 mm below the foramen magnum, similar to prior. Bone marrow signal intensity within normal limits. No scalp soft tissue abnormality. Sinuses/Orbits: Subtle bulging of the optic discs at the posterior aspects of the globes, suggesting elevated intracranial pressures. Globes orbital soft tissues otherwise unremarkable. Mild mucosal thickening about the sphenoid ethmoidal sinuses. Paranasal sinuses are otherwise clear. Trace bilateral mastoid effusions, of doubtful significance. Other: None. IMPRESSION: 1. Persistent findings of extensive dural venous sinus thrombosis, better characterized on  CT venogram performed the same day. 2. Evolving subacute venous ischemic changes involving the paramedian frontal lobes and likely the left occipital lobe, not significantly changed as compared to previous. Mild associated petechial hemorrhage at the posterior left frontal region, stable from prior. No frank hemorrhagic transformation or significant mass effect. No new or progressive areas of ischemia. 3. Low-lying cerebellar tonsils extending up to 9 mm below the  foramen magnum, similar to prior. Electronically Signed   By: Virgia Griffins M.D.   On: 03/09/2024 03:15   CT VENOGRAM HEAD Result Date: 03/08/2024 CLINICAL DATA:  Dural venous sinus thrombosis. Right-sided weakness. EXAM: CT VENOGRAM HEAD TECHNIQUE: Venographic phase images of the brain were obtained following the administration of intravenous contrast. Multiplanar reformats and maximum intensity projections were generated. RADIATION DOSE REDUCTION: This exam was performed according to the departmental dose-optimization program which includes automated exposure control, adjustment of the mA and/or kV according to patient size and/or use of iterative reconstruction technique. CONTRAST:  OMNIPAQUE  IOHEXOL  350 MG/ML SOLN COMPARISON:  CT venogram 03/02/2024 FINDINGS: Extensive mixed occlusive and subocclusive thrombus in the posterior aspect of the superior sagittal sinus extending to the torcula has mildly progressed from the prior CT venogram. Subocclusive thrombus in the transverse and sigmoid sinuses bilaterally is overall mildly improved. 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 The straight sinus, inferior sagittal sinus, vein of Galen, and internal cerebral veins are patent. Extensive venous collaterals are again noted. These results were called by telephone at the time of interpretation on 03/08/2024 at 2:26 p.m. to Dr. Consuelo Denmark, who verbally acknowledged these results. IMPRESSION: 1. Mild progression of extensive thrombus in the superior sagittal sinus posteriorly. 2. Mild improvement of subocclusive thrombus in the transverse and sigmoid sinuses bilaterally. 3. Persistent bilateral cavernous sinus thrombosis. Electronically Signed   By: Aundra Lee M.D.   On: 03/08/2024 15:59   CT ANGIO HEAD NECK W WO CM W PERF (CODE STROKE) Result Date: 03/08/2024 CLINICAL DATA:  Neuro deficit, acute, stroke suspected. Recent history of extensive dural venous sinus thrombosis. EXAM: CT  ANGIOGRAPHY HEAD AND NECK CT PERFUSION BRAIN TECHNIQUE: Multidetector CT imaging of the head and neck was performed using the standard protocol during bolus administration of intravenous contrast. Multiplanar CT image reconstructions and MIPs were obtained to evaluate the vascular anatomy. Carotid stenosis measurements (when applicable) are obtained utilizing NASCET criteria, using the distal internal carotid diameter as the denominator. Multiphase CT imaging of the brain was performed following IV bolus contrast injection. Subsequent parametric perfusion maps were calculated using RAPID software. RADIATION DOSE REDUCTION: This exam was performed according to the departmental dose-optimization program which includes automated exposure control, adjustment of the mA and/or kV according to patient size and/or use of iterative reconstruction technique. CONTRAST:  OMNIPAQUE  IOHEXOL  350 MG/ML SOLN COMPARISON:  CTA head and neck 02/01/2024.  MRI head 03/02/2024. FINDINGS: CTA NECK FINDINGS Aortic arch: Standard branching. Wide patency of the brachiocephalic and subclavian arteries. Right carotid system: Patent and smooth without evidence of stenosis or dissection. Left carotid system: Patent and smooth without evidence of stenosis or dissection. Vertebral arteries: Patent and smooth without evidence of stenosis or dissection. Strongly dominant left vertebral artery. Skeleton: No acute osseous abnormality or suspicious lesion. Other neck: No evidence of cervical lymphadenopathy or mass. Upper chest: No mass or consolidation in the included lung apices. Review of the MIP images confirms the above findings CTA HEAD FINDINGS Anterior circulation: The internal carotid arteries are widely patent from skull base to carotid  termini. ACAs and MCAs are patent without evidence of a proximal branch occlusion or significant proximal stenosis. No aneurysm is identified. Posterior circulation: The intracranial vertebral arteries  are patent with the left being strongly dominant. Patent PICA and SCA origins are visualized bilaterally. The basilar artery is widely patent. There are patent posterior communicating arteries bilaterally. Both PCAs are patent without evidence of a significant proximal stenosis. No aneurysm is identified. Venous sinuses: More fully evaluated on the separately reported dedicated CT venogram. Anatomic variants: None. Review of the MIP images confirms the above findings CT Brain Perfusion Findings: ASPECTS: 10 CBF (<30%) Volume: 0mL Perfusion (Tmax>6.0s) volume: 21mL, located in the parasagittal frontal lobes bilaterally and in the left cerebellar hemisphere Mismatch Volume: 21mL Infarction Location:No acute core infarct identified by CTP. IMPRESSION: 1. No emergent arterial occlusion or significant stenosis the head and neck. 2. 21 mL of prolonged Tmax in the parasagittal frontal lobes and left cerebellar hemisphere with subacute venous ischemia shown in the frontal lobes on recent MRI. No acute core infarct identified by CT perfusion. Electronically Signed   By: Aundra Lee M.D.   On: 03/08/2024 15:58   CT HEAD CODE STROKE WO CONTRAST Result Date: 03/08/2024 CLINICAL DATA:  Code stroke. Neuro deficit, acute, stroke suspected. Right-sided weakness. Recent history of extensive dural venous sinus thrombosis. EXAM: CT HEAD WITHOUT CONTRAST TECHNIQUE: Contiguous axial images were obtained from the base of the skull through the vertex without intravenous contrast. RADIATION DOSE REDUCTION: This exam was performed according to the departmental dose-optimization program which includes automated exposure control, adjustment of the mA and/or kV according to patient size and/or use of iterative reconstruction technique. COMPARISON:  Head CT, CT venogram, and MRI 03/02/2024 FINDINGS: Brain: There is unchanged mild edema in the parasagittal posterior left frontal lobe corresponding to a known subacute infarct. No definite new  infarct, intracranial hemorrhage, mass, midline shift, or extra-axial fluid collection is identified. The ventricles are unchanged in size. 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 Vascular: Known extensive dural venous sinus thrombosis, including hyperdense appearance of the posterior aspect of the superior sagittal sinus extending to the torcula. Skull: No acute fracture or suspicious lesion. Sinuses/Orbits: Paranasal sinuses and mastoid air cells are clear. Unremarkable orbits. Other: None. ASPECTS ([place]berta Stroke Program Early CT Score) - Ganglionic level infarction (caudate, lentiform nuclei, internal capsule, insula, M1-M3 cortex): 7 - Supraganglionic infarction (M4-M6 cortex): 3 Total score (0-10 with 10 being normal): 10 These results were communicated to Dr. Christiane Cowing at 2:24 pm on 03/08/2024 by text page via the [facility] messaging system. IMPRESSION: 1. 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 2. No definite new infarct or intracranial hemorrhage. 3. ASPECTS of 10. Electronically Signed   By: Aundra Lee M.D.   On: 03/08/2024 14:24   MR Brain W and Wo Contrast Result Date: 03/02/2024 CLINICAL DATA:  Acute neurologic deficit EXAM: MRI HEAD WITHOUT AND WITH CONTRAST TECHNIQUE: Multiplanar, multiecho pulse sequences of the brain and surrounding structures were obtained without and with intravenous contrast. CONTRAST:  6mL GADAVIST  GADOBUTROL  1 MMOL/ML IV SOLN COMPARISON:  02/01/2024 FINDINGS: Brain: There small areas of abnormal diffusion weighted intensity within the high bilateral paramedian frontal lobes. No definite ADC correlate. Location is similar to findings from 02/01/2024. Areas of signal loss on susceptibility weighted imaging likely reflect engorged cortical veins. There is multifocal hyperintense T2-weighted signal within the white matter. Parenchymal volume and CSF spaces are normal. Vascular:  Widespread  dural venous sinus thrombosis is better characterized on the concomitant CT venogram. There is mixed occlusive and nonocclusive thrombosis of the superior sagittal sinus, left transverse sinus, left sigmoid sinus and right transverse sinus. 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 Arterial flow voids are normal. Skull and upper cervical spine: Normal calvarium and skull base. Visualized upper cervical spine and soft tissues are normal. Sinuses/Orbits:No paranasal sinus fluid levels or advanced mucosal thickening. No mastoid or middle ear effusion. Normal orbits. IMPRESSION: 1. Small areas of abnormal diffusion weighted intensity within the high bilateral paramedian frontal lobes, similar to findings from 02/01/2024, consistent with subacute venous ischemia. 2. Widespread mixed occlusive and nonocclusive dural venous sinus thrombosis is better characterized on the concomitant CT venogram. Electronically Signed   By: Juanetta Nordmann M.D.   On: 03/02/2024 20:39   CT HEAD CODE STROKE WO CONTRAST Result Date: 03/02/2024 CLINICAL DATA:  Code stroke. Neuro deficit, acute, stroke suspected. Right-sided facial droop. Right-sided weakness. EXAM: CT VENOGRAM HEAD TECHNIQUE: Venographic phase images of the brain were obtained following the administration of intravenous contrast. Multiplanar reformats and maximum intensity projections were generated. RADIATION DOSE REDUCTION: This exam was performed according to the departmental dose-optimization program which includes automated exposure control, adjustment of the mA and/or kV according to patient size and/or use of iterative reconstruction technique. CONTRAST:  75mL OMNIPAQUE  IOHEXOL  350 MG/ML SOLN COMPARISON:  Prior head CT examinations 02/02/2024 and earlier. Brain MRI 02/01/2024. CT angiogram head/neck 02/01/2024. CT venogram head 02/01/2024. FINDINGS: NON-CONTRAST HEAD CT FINDINGS: Brain: No age-advanced or lobar predominant cerebral atrophy. A known venous infarct  within the medial left frontal lobe has increased in extent since the head CT of 02/02/2024, now spanning 2.6 cm (for instance as seen on series 6, image 35). Known subacute infarcts elsewhere within the bilateral medial frontal lobes, occult by CT and better appreciated on the prior brain MRI of 02/01/2024. Partially empty sella turcica. 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 There is no acute intracranial hemorrhage. No extra-axial fluid collection. No evidence of an intracranial mass. No midline shift. Vascular: No hyperdense arterial vessel. Venous findings reported below. Skull: No calvarial fracture or aggressive osseous lesion. Sinuses/Orbits: No mass or acute finding within the imaged orbits. No significant paranasal sinus disease at the imaged levels. CT VENOGRAM HEAD FINDINGS: Residual/recurrent occlusive and subocclusive thrombi within the superior sagittal sinus posteriorly, extending to the confluence of sinuses. Thrombus extends into the medial aspect of the right transverse dural venous sinus. New from the CT venogram head of 02/01/2024, there is some reconstitution of enhancement within the right transverse dural venous sinus more laterally. Clot burden within the right sigmoid sinus has also decreased. Residual/recurrent subocclusive thrombus within the left transverse and sigmoid dural venous sinuses. 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 The straight sinus, inferior sagittal sinus and vein of Galen are patent. The internal cerebral veins are patent. (Series 4, image 128). Extensive venous collaterals again noted. These results were called by telephone at the time of interpretation on 03/02/2024 at 4:59 pm to provider Dr. Doretta Gant, who verbally acknowledged these results. IMPRESSION: Non-contrast head CT: 1. 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 Consider a brain MRI to assess for acute components. 2. Known subacute infarcts elsewhere within the medial frontal lobes bilaterally, occult by CT and better appreciated on the prior brain MRI  of 02/01/2024. 3. Partially empty sella turcica. This finding can reflect incidental anatomic variation, or alternatively, it can be associated with chronic idiopathic intracranial hypertension (pseudotumor cerebri). 4. 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 CT venogram head: 1. Residual/recurrent occlusive and subocclusive thrombi within the superior sagittal sinus posteriorly, extending to the confluence of sinuses. 2. Thrombus extends into the medial aspect of the right transverse dural venous sinus. 3. New from the CT venogram head of 02/01/2024, there is some reconstitution of enhancement within the right transverse dural venous sinus more laterally. Clot burden within the right sigmoid sinus has also decreased. 4. Residual/recurrent subocclusive thrombus within the left transverse and sigmoid dural venous sinuses. 5. 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 6. The straight sinus, inferior sagittal sinus and vein of Galen are now patent. 7. Extensive venous collaterals again noted. Electronically Signed   By: Bascom Lily D.O.   On: 03/02/2024 17:00   CT VENOGRAM HEAD Result Date: 03/02/2024 CLINICAL DATA:  Code stroke. Neuro deficit, acute, stroke suspected. Right-sided facial droop. Right-sided weakness. EXAM: CT VENOGRAM HEAD TECHNIQUE: Venographic phase images of the brain were obtained following the administration of intravenous contrast. Multiplanar reformats and maximum intensity projections were generated. RADIATION DOSE REDUCTION: This exam was performed according to the departmental dose-optimization program which includes automated exposure control, adjustment of the mA and/or kV according to patient size and/or use of  iterative reconstruction technique. CONTRAST:  75mL OMNIPAQUE  IOHEXOL  350 MG/ML SOLN COMPARISON:  Prior head CT examinations 02/02/2024 and earlier. Brain MRI 02/01/2024. CT angiogram head/neck 02/01/2024. CT venogram head 02/01/2024. FINDINGS: NON-CONTRAST HEAD CT FINDINGS: Brain: No age-advanced or lobar predominant cerebral atrophy. 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 Known subacute infarcts elsewhere within the bilateral medial frontal lobes, occult by CT and better appreciated on the prior brain MRI of 02/01/2024. Partially empty sella turcica. 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 There is no acute intracranial hemorrhage. No extra-axial fluid collection. No evidence of an intracranial mass. No midline shift. Vascular: No hyperdense arterial vessel. Venous findings reported below. Skull: No calvarial fracture or aggressive osseous lesion. Sinuses/Orbits: No mass or acute finding within the imaged orbits. No significant paranasal sinus disease at the imaged levels. CT VENOGRAM HEAD FINDINGS: Residual/recurrent occlusive and subocclusive thrombi within the superior sagittal sinus posteriorly, extending to the confluence of sinuses. Thrombus extends into the medial aspect of the right transverse dural venous sinus. New from the CT venogram head of 02/01/2024, there is some reconstitution of enhancement within the right transverse dural venous sinus more laterally. Clot burden within the right sigmoid sinus has also decreased. Residual/recurrent subocclusive thrombus within the left transverse and sigmoid dural venous sinuses. 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 The straight sinus, inferior sagittal sinus and vein of Galen are patent. The internal cerebral veins are patent.  (Series 4, image 128). Extensive venous collaterals again noted. These results were called by telephone at the time of interpretation on 03/02/2024 at 4:59 pm to provider Dr. Doretta Gant, who verbally acknowledged these results. IMPRESSION: Non-contrast head CT: 1. 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 Consider a brain MRI to assess for acute components. 2. Known subacute infarcts elsewhere within  the medial frontal lobes bilaterally, occult by CT and better appreciated on the prior brain MRI of 02/01/2024. 3. Partially empty sella turcica. This finding can reflect incidental anatomic variation, or alternatively, it can be associated with chronic idiopathic intracranial hypertension (pseudotumor cerebri). 4. 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 CT venogram head: 1. Residual/recurrent occlusive and subocclusive thrombi within the superior sagittal sinus posteriorly, extending to the confluence of sinuses. 2. Thrombus extends into the medial aspect of the right transverse dural venous sinus. 3. New from the CT venogram head of 02/01/2024, there is some reconstitution of enhancement within the right transverse dural venous sinus more laterally. Clot burden within the right sigmoid sinus has also decreased. 4. Residual/recurrent subocclusive thrombus within the left transverse and sigmoid dural venous sinuses. 5. 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 6. The straight sinus, inferior sagittal sinus and vein of Galen are now patent. 7. Extensive venous collaterals again noted. Electronically Signed   By: Bascom Lily D.O.   On: 03/02/2024 17:00    Labs:  CBC: Recent Labs    02/10/24 0514 03/02/24 1611 03/02/24 1620 03/08/24 1403 03/08/24 1829 03/09/24 0543  WBC 7.6 6.7  --   --  12.6* 10.3  HGB 12.4* 13.2 13.6 13.6 14.6 13.8  HCT 38.1* 41.3 40.0 40.0 44.4 41.7  PLT  257 229  --   --  230 245    COAGS: Recent Labs    02/01/24 1811 03/02/24 1611 03/08/24 1829 03/08/24 2117 03/09/24 0543 03/09/24 0947  INR 1.0 1.4* 1.4*  --   --   --   APTT 23* 35 47* 56* 77* 74*    BMP: Recent Labs    03/02/24 1611 03/02/24 1620 03/03/24 1057 03/04/24 0612 03/08/24 1403 03/08/24 1829 03/09/24 1330  NA 143   < > 145 141 144 141 136  K 3.4*   < > 3.6 3.5 3.6 3.8  --   CL 110   < > 125* 111 115* 109  --   CO2 21*  --  14* 18*  --  18*  --   GLUCOSE 104*   < > 95 103* 111* 120*  --   BUN 5*   < > <5* <5* 7 8  --   CALCIUM  9.1  --  6.7* 9.2  --  9.3  --   CREATININE 1.05   < > 0.66 1.10 0.80 1.09  --   GFRNONAA >60  --  >60 >60  --  >60  --    < > = values in this interval not displayed.    LIVER FUNCTION TESTS: Recent Labs    01/29/24 0405 02/01/24 1811 03/02/24 1611 03/08/24 1829  BILITOT 0.8 1.3* 0.7 0.8  AST 39 20 30 25   ALT 156* 76* 54* 42  ALKPHOS 47 44 51 66  PROT 7.0 7.6 6.9 7.3  ALBUMIN 3.5 4.0 4.0 4.2    TUMOR MARKERS: No results for input(s): "AFPTM", "CEA", "CA199", "CHROMGRNA" in the last 8760 hours.  Assessment and Plan: Dural sinus thrombosis   Patient is a 33 y/o male with findings of dural venous thrombosis. This is noted on the most recent brain MRI 03/08/24. After review by Dr. Alvira Josephs, dural sinus thrombectomy recommended.  Risks and benefits of cerebral angiogram with intervention (dural sinus thrombectomy) were discussed with the patient including, but not limited to bleeding, infection, vascular injury, contrast induced renal failure, stroke or even death.  This interventional procedure involves the use of X-rays and  because of the nature of the planned procedure, it is possible that we will have prolonged use of X-ray fluoroscopy.  Potential radiation risks to you include (but are not limited to) the following: - A slightly elevated risk for cancer  several years later in life. This risk is typically less than  0.5% percent. This risk is low in comparison to the normal incidence of human cancer, which is 33% for women and 50% for men according to the [employer] Society. - Radiation induced injury can include skin redness, resembling a rash, tissue breakdown / ulcers and hair loss (which can be temporary or permanent).   The likelihood of either of these occurring depends on the difficulty of the procedure and whether you are sensitive to radiation due to previous procedures, disease, or genetic conditions.   IF your procedure requires a prolonged use of radiation, you will be notified and given written instructions for further action.  It is your responsibility to monitor the irradiated area for the 2 weeks following the procedure and to notify your physician if you are concerned that you have suffered a radiation induced injury.    All of the patient's questions were answered, patient is agreeable to proceed.  Consent signed and in chart.  Thank you for this interesting consult.  I greatly enjoyed meeting Yunus Wickware and look forward to participating in their care.  A copy of this report was sent to the requesting provider on this date.  Electronically Signed: Lawrance Presume, PA 03/09/2024, 4:34 PM   I spent a total of 20 Minutes    in face to face in clinical consultation, greater than 50% of which was counseling/coordinating care for dural sinus thrombosis

## 2024-03-09 NOTE — Sedation Documentation (Signed)
 Pt under care of anesthesia. See CRNA charting.

## 2024-03-09 NOTE — Plan of Care (Signed)
   Problem: Safety: Goal: Ability to remain free from injury will improve Outcome: Progressing   Problem: Skin Integrity: Goal: Risk for impaired skin integrity will decrease Outcome: Progressing

## 2024-03-09 NOTE — Progress Notes (Signed)
 Patient became very hard to rouse and continuing to hold saliva in his mouth. Called rapid response, notified MD, and neurology at bedside.

## 2024-03-09 NOTE — Progress Notes (Signed)
 ABG sample obtained x2 attempts. Sample walked to lab at this time. RT will continue to monitor and be available as needed.

## 2024-03-09 NOTE — Progress Notes (Signed)
 PHARMACY - ANTICOAGULATION CONSULT NOTE  Pharmacy Consult for heparin  Indication:  Venous sinus thrombosis   No Known Allergies  Patient Measurements: Height: 5\' 4"  (162.6 cm) Weight: 65 kg (143 lb 4.8 oz) IBW/kg (Calculated) : 59.2 HEPARIN  DW (KG): 65  Vital Signs: Temp: 99.9 F (37.7 C) (04/29 0552) Temp Source: Axillary (04/29 0552) BP: 123/78 (04/29 0552) Pulse Rate: 57 (04/29 0552)  Labs: Recent Labs    03/08/24 1403 03/08/24 1829 03/08/24 2117 03/09/24 0543  HGB 13.6 14.6  --  13.8  HCT 40.0 44.4  --  41.7  PLT  --  230  --  245  APTT  --  47* 56* 77*  LABPROT  --  17.1*  --   --   INR  --  1.4*  --   --   HEPARINUNFRC  --   --  >1.10* >1.10*  CREATININE 0.80 1.09  --   --   CKTOTAL  --  262  --   --     Estimated Creatinine Clearance: 80 mL/min (by C-G formula based on SCr of 1.09 mg/dL).   Medical History: Past Medical History:  Diagnosis Date   Deaf      Assessment: 33 yoM recently admitted with venous sinus thrombosis. Discharged home on Eliquis  (unknown last dose (pt is deaf and family is non-english speaking)), presenting to the ED with worsening right sided weakness. CT shows extension of the previous thrombus. Neurology has asked to start heparin  infusion without bolus.   Heparin  level falsely elevated from recent Eliquis , aPTT at goal on 900 units/hr.  No known issues with IV infusion, no overt bleeding or complications per RN. CBC stable  Goal of Therapy:  Heparin  level 0.3-0.5 units/ml aPTT: 66-85 seconds Monitor platelets by anticoagulation protocol: Yes   Plan:  Continue IV heparin  at 900 units/hr. Check confirmatory aPTT in 8 hours  Monitor with aPTT until correlates with heparin  level Continue to monitor H&H and platelets  Young Hensen, PharmD. Clinical Pharmacist 03/09/2024 7:01 AM

## 2024-03-09 NOTE — Transfer of Care (Signed)
 Immediate Anesthesia Transfer of Care Note  Patient: Samuel Warren  Procedure(s) Performed: RADIOLOGY WITH ANESTHESIA  Patient Location: PACU  Anesthesia Type:General  Level of Consciousness: awake and responds to stimulation  Airway & Oxygen Therapy: Patient Spontanous Breathing  Post-op Assessment: Report given to RN and Post -op Vital signs reviewed and stable  Post vital signs: Reviewed and stable  Last Vitals:  Vitals Value Taken Time  BP 112/99 03/09/24 2100  Temp 35.4 03/09/24 2100  Pulse 85 03/09/24 2100  Resp 18 03/09/24 2100  SpO2 99 % 03/09/24 2100  Vitals shown include unfiled device data.  Bair hugger applied covering patient's neck and entire body.    Complications: No notable events documented.

## 2024-03-09 NOTE — TOC CAGE-AID Note (Signed)
 Transition of Care [facility]) - CAGE-AID Screening   Patient Details  Name: Samuel Warren MRN: [id number] Date of Birth: 08-Jul-1991  Transition of Care [facility]) CM/SW Contact:    Rayne Loiseau E Neyra Pettie, LCSW Phone Number: 03/09/2024, 8:41 AM   Clinical Narrative: Per chart review - patient disoriented x 3.   CAGE-AID Screening: Substance Abuse Screening unable to be completed due to: : Patient unable to participate (patient disoriented x 3)

## 2024-03-09 NOTE — Procedures (Signed)
 INR.  Status post bilateral common carotid arteriograms and internal jugular venograms.  Left CFA approach and right common femoral vein approach.  Findings.   Absent flow noted in the posterior one third of the superior sagittal sinus, the transverse sinuses and the sigmoid sinuses bilaterally including the internal jugular veins proximally.  Multiple attempts at revascularization of the proximal internal jugular veins, the sigmoid sinuses and the transverse sinuses unsuccessful due to occluded internal jugular veins at the bulb.  Post CT brain continues to demonstrate extensive venous congestion.  No hemorrhage noted.  Mild prominence of the temporal horns noted.  Manual  compression held at the right common femoral venous puncture site, and a 5 F exoseal  closure device deployed at the left common femoral arterial puncture site.  Distal pulses present unchanged compared to prior to procedure.  Patient extubated.  Pupils 2 mm bilaterally and sluggishly reactive.  No gross facial asymmetry.  Jory Ng MD.

## 2024-03-09 NOTE — Evaluation (Signed)
 Clinical/Bedside Swallow Evaluation Patient Details  Name: Samuel Warren MRN: [id number] Date of Birth: Feb 25, 1991  Today's Date: 03/09/2024 Time: SLP Start Time (ACUTE ONLY): 0955 SLP Stop Time (ACUTE ONLY): 1010 SLP Time Calculation (min) (ACUTE ONLY): 15 min  Past Medical History:  Past Medical History:  Diagnosis Date   Deaf    Past Surgical History:  Past Surgical History:  Procedure Laterality Date   IR ANGIO INTRA EXTRACRAN SEL COM CAROTID INNOMINATE BILAT MOD SED  02/02/2024   IR THROMBECT VENO MECH MOD SED  02/02/2024   IR VENO/JUGULAR LEFT  02/02/2024   RADIOLOGY WITH ANESTHESIA N/A 02/02/2024   Procedure: RADIOLOGY WITH ANESTHESIA;  Surgeon: Luellen Sages, MD;  Location: [facility] OR;  Service: Radiology;  Laterality: N/A;   HPI:  Patient is a 33 y.o. male with PMH: deafness (no formal language system), GERD, CVA, venous sinus thrombosis, meningoencephalitis, recently discharged from [facility] on 03/05/24 for right sided weakness. He returned to the ED o n 03/08/24 after being found down with new onset right sided weakness. MRI brain which showed persistent findings of extensive dural venous sinus thrombosis and Evolving subacute versus venous ischemic changes were also noted. He has been NPO awaiting SLP swallow evaluation.    Assessment / Plan / Recommendation  Clinical Impression  Patient is presenting with clinical s/s of what at this point appears to be a cognitive-based dysphagia. He had eyes closed but slowly awakened to gentle touch on shoulder. He did not make eye contact or interact with SLP except to brush away SLP's hand when trying to move patient's head to midline. (head turned far to left) He opened mouth slightly to allow SLP to remove some secretions from left and right buccal cavities but only relaxed jaw one time to open mouth slightly further to allow for SLP to provide suctioning with toothette sponge. Ice chip placed in anterior portion of oral cavity did not result in any  patient attempts to manipulate it and SLP then removed ice chip. SLP will follow patient for PO readiness. SLP Visit Diagnosis: Dysphagia, unspecified (R13.10)    Aspiration Risk  Severe aspiration risk;Risk for inadequate nutrition/hydration;Moderate aspiration risk    Diet Recommendation NPO    Medication Administration: Via alternative means    Other  Recommendations Oral Care Recommendations: Oral care QID;Staff/trained caregiver to provide oral care Caregiver Recommendations: Have oral suction available    Recommendations for follow up therapy are one component of a multi-disciplinary discharge planning process, led by the attending physician.  Recommendations may be updated based on patient status, additional functional criteria and insurance authorization.  Follow up Recommendations        Assistance Recommended at Discharge    Functional Status Assessment Patient has had a recent decline in their functional status and demonstrates the ability to make significant improvements in function in a reasonable and predictable amount of time.  Frequency and Duration min 3x week  2 weeks       Prognosis Prognosis for improved oropharyngeal function: Fair Barriers to Reach Goals: Severity of deficits      Swallow Study   General Date of Onset: 03/08/24 HPI: Patient is a 33 y.o. male with PMH: deafness (no formal language system), GERD, CVA, venous sinus thrombosis, meningoencephalitis, recently discharged from [facility] on 03/05/24 for right sided weakness. He returned to the ED o n 03/08/24 after being found down with new onset right sided weakness. MRI brain which showed persistent findings of extensive dural venous sinus thrombosis and Evolving subacute  versus venous ischemic changes were also noted. He has been NPO awaiting SLP swallow evaluation. Type of Study: Bedside Swallow Evaluation Previous Swallow Assessment: BSE 03/03/24 Diet Prior to this Study: NPO Temperature Spikes Noted:  No Respiratory Status: Room air History of Recent Intubation: No Behavior/Cognition: Lethargic/Drowsy;Alert;Requires cueing;Doesn't follow directions Oral Cavity Assessment: Other (comment);Excessive secretions (patient would not fully open mouth) Oral Care Completed by SLP: Yes Oral Cavity - Dentition: Adequate natural dentition Self-Feeding Abilities: Total assist Patient Positioning: Upright in bed Baseline Vocal Quality: Not observed Volitional Cough: Cognitively unable to elicit Volitional Swallow: Unable to elicit    Oral/Motor/Sensory Function Overall Oral Motor/Sensory Function: Other (comment) (patient did not participate in OME)   Ice Chips Ice chips: Impaired Oral Phase Impairments: Poor awareness of bolus   Thin Liquid Thin Liquid: Not tested    Nectar Thick Nectar Thick Liquid: Not tested   Honey Thick Honey Thick Liquid: Not tested   Puree Puree: Not tested   Solid     Solid: Not tested     Jacqualine Mater, MA, CCC-SLP Speech Therapy

## 2024-03-09 NOTE — Anesthesia Preprocedure Evaluation (Signed)
 Anesthesia Evaluation  Patient identified by MRN, date of birth, ID band Patient confused    Reviewed: Allergy & Precautions, Unable to perform ROS - Chart review onlyPreop documentation limited or incomplete due to emergent nature of procedure.  History of Anesthesia Complications Negative for: history of anesthetic complications  Airway Mallampati: Unable to assess       Dental   Pulmonary neg pulmonary ROS   breath sounds clear to auscultation       Cardiovascular  Rhythm:Regular     Neuro/Psych  Headaches CVA    GI/Hepatic negative GI ROS, Neg liver ROS,,,  Endo/Other    Renal/GU Lab Results      Component                Value               Date                      NA                       136                 03/09/2024                K                        3.8                 03/08/2024                CO2                      18 (L)              03/08/2024                GLUCOSE                  120 (H)             03/08/2024                BUN                      8                   03/08/2024                CREATININE               1.09                03/08/2024                CALCIUM                   9.3                 03/08/2024                GFRNONAA                 >60                 03/08/2024                Musculoskeletal negative musculoskeletal ROS (+)    Abdominal   Peds  Hematology Lab Results      Component                Value               Date                      WBC                      10.3                03/09/2024                HGB                      13.8                03/09/2024                HCT                      41.7                03/09/2024                MCV                      80.5                03/09/2024                PLT                      245                 03/09/2024              Anesthesia Other Findings   Reproductive/Obstetrics                              Anesthesia Physical Anesthesia Plan  ASA: 4 and emergent  Anesthesia Plan: General   Post-op Pain Management: Minimal or no pain anticipated   Induction: Intravenous  PONV Risk Score and Plan: 2 and Ondansetron  and Dexamethasone   Airway Management Planned: Oral ETT  Additional Equipment: CVP, Ultrasound Guidance Line Placement and Arterial line  Intra-op Plan:   Post-operative Plan: Possible Post-op intubation/ventilation  Informed Consent:      Interpreter used for interview and Consent reviewed with POA  Plan Discussed with: CRNA  Anesthesia Plan Comments:        Anesthesia Quick Evaluation

## 2024-03-09 NOTE — TOC CM/SW Note (Signed)
 Transition of Care [facility]) - Inpatient Brief Assessment   Patient Details  Name: Chirag Daigneault MRN: [id number] Date of Birth: 1991/10/01  [facility]) CM/SW Contact:    Jonathan Neighbor, RN Phone Number: 03/09/2024, 12:28 PM   Clinical Narrative: Pt with frequent admissions. Recently d/c ed with stroke and outpatient therapy at [facility].  Pt has walker/ BSC at home.  Family provides needed transportation and assist at home. TOC following for d/c needs.    Transition of Care Asessment: Insurance and Status: Insurance coverage has been reviewed Patient has primary care physician: Yes Home environment has been reviewed: home with family   Prior/Current Home Services: No current home services Social Drivers of Health Review: SDOH reviewed no interventions necessary Readmission risk has been reviewed: Yes Transition of care needs: transition of care needs identified, TOC will continue to follow

## 2024-03-09 NOTE — Progress Notes (Signed)
 PHARMACY - ANTICOAGULATION CONSULT NOTE  Pharmacy Consult for heparin  Indication:  Venous sinus thrombosis   No Known Allergies  Patient Measurements: Height: 5\' 4"  (162.6 cm) Weight: 65 kg (143 lb 4.8 oz) IBW/kg (Calculated) : 59.2 HEPARIN  DW (KG): 65  Vital Signs: Temp: 99.9 F (37.7 C) (04/29 0552) Temp Source: Axillary (04/29 0552) BP: 123/78 (04/29 0552) Pulse Rate: 57 (04/29 0552)  Labs: Recent Labs    03/08/24 1403 03/08/24 1403 03/08/24 1829 03/08/24 2117 03/09/24 0543 03/09/24 0947  HGB 13.6  --  14.6  --  13.8  --   HCT 40.0  --  44.4  --  41.7  --   PLT  --   --  230  --  245  --   APTT  --    < > 47* 56* 77* 74*  LABPROT  --   --  17.1*  --   --   --   INR  --   --  1.4*  --   --   --   HEPARINUNFRC  --   --   --  >1.10* >1.10*  --   CREATININE 0.80  --  1.09  --   --   --   CKTOTAL  --   --  262  --   --   --    < > = values in this interval not displayed.    Estimated Creatinine Clearance: 80 mL/min (by C-G formula based on SCr of 1.09 mg/dL).   Medical History: Past Medical History:  Diagnosis Date   Deaf      Assessment: 24 yoM recently admitted with venous sinus thrombosis. Discharged home on Eliquis  (unknown last dose (pt is deaf and family is non-english speaking)), presenting to the ED with worsening right sided weakness. CT shows extension of the previous thrombus. Neurology has asked to start heparin  infusion without bolus.   Heparin  level falsely elevated from recent Eliquis , aPTT at goal on 900 units/hr.  No known issues with IV infusion, no overt bleeding or complications per RN. CBC stable  Confirmed PTT came back therapeutic. We will continue with current rate.   Goal of Therapy:  Heparin  level 0.3-0.5 units/ml aPTT: 66-85 seconds Monitor platelets by anticoagulation protocol: Yes   Plan:  Continue IV heparin  at 900 units/hr. Monitor with aPTT until correlates with heparin  level Continue to monitor H&H and platelets  Ivery Marking, PharmD, BCIDP, AAHIVP, CPP Infectious Disease Pharmacist 03/09/2024 11:53 AM

## 2024-03-09 NOTE — Progress Notes (Signed)
 Pt off floor at this time. RT to obtain ABG once pt arrives back to room.

## 2024-03-09 NOTE — TOC Initial Note (Addendum)
 Transition of Care [facility]) - Initial/Assessment Note    Patient Details  Name: Samuel Warren MRN: [id number] Date of Birth: October 27, 1991  Transition of [facility]) CM/SW Contact:    Juliane Och, LCSW Phone Number: 03/09/2024, 2:51 PM  Clinical Narrative:                  2:51 PM CSW introduced self and role to patient's brother, Ambrosio Bailey (patient is currently fully disoriented). Thoaih informed CSW that patient lives with parents and spouse, Hlinh Kpa, who does not have a phone number (uses [employer]). Thoaih denied patient history of HH/SNF and stated that patient has a walker at home that he did not use prior to current hospitalization. No TOC needs were identified at this time.  Expected Discharge Plan: Home/Self Care Barriers to Discharge: Continued Medical Work up   Patient Goals and CMS Choice            Expected Discharge Plan and Services       Living arrangements for the past 2 months: Single Family Home                                      Prior Living Arrangements/Services Living arrangements for the past 2 months: Single Family Home Lives with:: Parents, Spouse Patient language and need for interpreter reviewed:: Yes        Need for Family Participation in Patient Care: Yes (Comment) Care giver support system in place?: Yes (comment)   Criminal Activity/Legal Involvement Pertinent to Current Situation/Hospitalization: No - Comment as needed  Activities of Daily Living      Permission Sought/Granted Permission sought to share information with : Family Supports Permission granted to share information with : No (Contact information on chart)  Share Information with NAME: Keeon Irlbeck     Permission granted to share info w Relationship: Brother  Permission granted to share info w Contact Information: [phone]  Emotional Assessment Appearance:: Appears stated age Attitude/Demeanor/Rapport: Unable to Assess Affect (typically  observed): Unable to Assess   Alcohol / Substance Use: Not Applicable Psych Involvement: No (comment)  Admission diagnosis:  Right hemiplegia [facility]) [G81.91] Patient Active Problem List   Diagnosis Date Noted   Right hemiplegia (HCC) 03/08/2024   Right sided weakness 03/02/2024   Hyperlipidemia 02/10/2024   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   Acute cerebral venous sinus thrombosis 02/01/2024   Acute encephalopathy 01/25/2024   Headache 01/25/2024   Neck pain 01/25/2024   Leukocytosis 01/25/2024   PCP:  Alto Atta, NP Pharmacy:   [id number] ([address]), [place] - [address] [place] ([place]) [place] [address] Phone: [phone] Fax: [phone]     Social Drivers of Health (SDOH) Social History: SDOH Screenings   Food Insecurity: No Food Insecurity (03/08/2024)  Housing: Low Risk  (03/09/2024)  Transportation Needs: No Transportation Needs (03/08/2024)  Utilities: Not At Risk (03/08/2024)  Social Connections: Patient Declined (02/08/2024)  Tobacco Use: Low Risk  (03/02/2024)   SDOH Interventions:     Readmission Risk Interventions    01/29/2024   11:53 AM  Readmission Risk Prevention Plan  Post Dischage Appt Complete  Medication Screening Complete  Transportation Screening Complete

## 2024-03-09 NOTE — Progress Notes (Addendum)
 [facility] PROGRESS NOTE   Samuel Warren LKG:[id number] DOB: 1991/08/09 DOA: 03/08/2024  PCP: Alto Atta, NP  Brief History: 33 y.o. male with medical history significant of deafness, venous sinus thrombosis on eliquis , and meningoencephalitis presented to ED on 4/28 as a code stroke after being found down w/ new onset R sided weakness.  Patient has deafness and unable to communicate orally.  Of note patient was recently hospitalized for right-sided weakness.  Was seen by stroke service at that time.  Patient was discharged on 4/25.  Consultants: Neurology  Procedures: EEG    Subjective/Interval History: Patient not following any commands.  Opens his eyes but unable to participate in conversation despite his hearing loss.  Do not think a sign language interpreter at this time will be of any benefit.  Family member is at bedside.    Assessment/Plan:  Right-sided weakness/acute encephalopathy Recently hospitalized for similar issue.  Underwent MRI brain which showed persistent findings of extensive dural venous sinus thrombosis.  Evolving subacute versus venous ischemic changes were also noted.  Low-lying cerebellar tonsils were noted. Neurology is following. EEG has been ordered. PT and OT evaluation.  ADDENDUM Patient with worsening neurological status.  Initially a code stroke was called.  Patient was seen by neurology.  Sent down for stat CT head.  CT reviewed by neurology.  They feel that patient has worsening cerebral edema.  Plan to transfer to ICU for 3% saline.  Vital signs are stable.  ABG reviewed.  Will need close monitoring due to potential for airway compromise.  CCM has been notified.  Dural venous sinus thrombosis Management per neurology.  Patient was on apixaban  prior to admission.  Currently on IV heparin .  Deafness Currently unable to even participate with sign language interpreter. Sign language interpreter can be used when the patient is less  encephalopathic.   DVT Prophylaxis: On IV heparin  Code Status: Full code Family Communication: Discussed with family at bedside Disposition Plan: To be determined  Status is: Inpatient Remains inpatient appropriate because: Altered mental status      Medications: Scheduled:  atorvastatin   80 mg Oral Daily   pantoprazole   40 mg Oral Daily   potassium chloride   40 mEq Oral Once   sodium chloride  flush  3 mL Intravenous Once   topiramate   25 mg Oral Daily   Continuous:  sodium chloride  100 mL/hr at 03/09/24 0415   heparin  900 Units/hr (03/08/24 2305)   PRN:acetaminophen , butalbital -acetaminophen -caffeine , morphine injection, ondansetron   Antibiotics: Anti-infectives (From admission, onward)    None       Objective:  Vital Signs  Vitals:   03/08/24 1648 03/08/24 1951 03/09/24 0038 03/09/24 0552  BP: 128/80 131/82 122/77 123/78  Pulse:  (!) 53 (!) 53 (!) 57  Resp: 16 17 17 17   Temp: 97.7 F (36.5 C) 98.7 F (37.1 C) 98.8 F (37.1 C) 99.9 F (37.7 C)  TempSrc: Axillary Axillary Axillary Axillary  SpO2: 100% 100% 99% 100%  Weight:      Height:        Intake/Output Summary (Last 24 hours) at 03/09/2024 0918 Last data filed at 03/09/2024 0554 Gross per 24 hour  Intake 269.16 ml  Output 600 ml  Net -330.84 ml   Filed Weights   03/08/24 1400  Weight: 65 kg    General appearance: Opens his eyes but does not really engage. Resp: Clear to auscultation bilaterally.  Normal effort Cardio: S1-S2 is normal regular.  No S3-S4.  No rubs murmurs or bruit GI:  Abdomen is soft.  Nontender nondistended.  Bowel sounds are present normal.  No masses organomegaly  Lab Results:  Data Reviewed: I have personally reviewed following labs and reports of the imaging studies  CBC: Recent Labs  Lab 03/02/24 1611 03/02/24 1620 03/08/24 1403 03/08/24 1829 03/09/24 0543  WBC 6.7  --   --  12.6* 10.3  NEUTROABS 4.1  --   --  10.1*  --   HGB 13.2 13.6 13.6 14.6 13.8  HCT  41.3 40.0 40.0 44.4 41.7  MCV 84.6  --   --  81.3 80.5  PLT 229  --   --  230 245    Basic Metabolic Panel: Recent Labs  Lab 03/02/24 1611 03/02/24 1620 03/03/24 1057 03/04/24 0612 03/08/24 1403 03/08/24 1829  NA 143 143 145 141 144 141  K 3.4* 3.4* 3.6 3.5 3.6 3.8  CL 110 109 125* 111 115* 109  CO2 21*  --  14* 18*  --  18*  GLUCOSE 104* 104* 95 103* 111* 120*  BUN 5* 6 <5* <5* 7 8  CREATININE 1.05 1.00 0.66 1.10 0.80 1.09  CALCIUM  9.1  --  6.7* 9.2  --  9.3  MG  --   --  1.4* 2.3  --   --     GFR: Estimated Creatinine Clearance: 80 mL/min (by C-G formula based on SCr of 1.09 mg/dL).  Liver Function Tests: Recent Labs  Lab 03/02/24 1611 03/08/24 1829  AST 30 25  ALT 54* 42  ALKPHOS 51 66  BILITOT 0.7 0.8  PROT 6.9 7.3  ALBUMIN 4.0 4.2     Coagulation Profile: Recent Labs  Lab 03/02/24 1611 03/08/24 1829  INR 1.4* 1.4*    Cardiac Enzymes: Recent Labs  Lab 03/08/24 1829  CKTOTAL 262   CBG: Recent Labs  Lab 03/02/24 1614 03/08/24 1359  GLUCAP 114* 126*    Lipid Profile: Recent Labs    03/08/24 1829  CHOL 117  HDL 36*  LDLCALC 58  TRIG 253  CHOLHDL 3.3     Recent Results (from the past 240 hours)  Culture, blood (Routine X 2) w Reflex to ID Panel     Status: None (Preliminary result)   Collection Time: 03/08/24  6:45 PM   Specimen: BLOOD LEFT HAND  Result Value Ref Range Status   Specimen Description BLOOD LEFT HAND  Final   Special Requests   Final    BOTTLES DRAWN AEROBIC ONLY Blood Culture results may not be optimal due to an inadequate volume of blood received in culture bottles   Culture   Final    NO GROWTH < 12 HOURS Performed at [facility], [address], [place], [place] [address]    Report Status PENDING  Incomplete  Culture, blood (Routine X 2) w Reflex to ID Panel     Status: None (Preliminary result)   Collection Time: 03/08/24  6:50 PM   Specimen: BLOOD LEFT HAND  Result Value Ref Range Status   Specimen  Description BLOOD LEFT HAND  Final   Special Requests   Final    BOTTLES DRAWN AEROBIC ONLY Blood Culture results may not be optimal due to an inadequate volume of blood received in culture bottles   Culture   Final    NO GROWTH < 12 HOURS Performed at [facility], [address], [place], [place] [address]    Report Status PENDING  Incomplete      Radiology Studies: EEG adult  Result Date: 03/09/2024 Arleene Lack, MD     03/09/2024  8:35 AM Patient Name: Samuel Warren MRN: [id number] Epilepsy Attending: Arleene Lack Referring Physician/Provider: Laymond Priestly, NP Date: 03/08/2024 Duration: 22.15 mins Patient history: 33yo M with new onset R sided weakness. EEG to evaluate for seizure Level of alertness: Awake AEDs during EEG study: TPM Technical aspects: This EEG study was done with scalp electrodes positioned according to the 10-20 International system of electrode placement. Electrical activity was reviewed with band pass filter of 1-70Hz , sensitivity of 7 uV/mm, display speed of 27mm/sec with a 60Hz  notched filter applied as appropriate. EEG data were recorded continuously and digitally stored.  Video monitoring was available and reviewed as appropriate. Description: The posterior dominant rhythm consists of 8 Hz activity of moderate voltage (25-35 uV) seen predominantly in posterior head regions, asymmetric ( left<right) and reactive to eye opening and eye closing. EEG showed continuous polymorphic 3 to 6 Hz theta-delta slowing in left hemisphere as well as intermittent generalized 3-5hz  theta-delta slowing.Hyperventilation and photic stimulation were not performed.   ABNORMALITY - Continuous slow, left hemisphere - Intermittent slow, generalized - Background asymmetry, left<right IMPRESSION: This study is suggestive of cortical dysfunction arising from left hemisphere likely secondary to underlying structural abnormality. Additionally there is mild to moderate diffuse encephalopathy.  No seizures or epileptiform discharges were seen throughout the recording. Arleene Lack   MR BRAIN W WO CONTRAST Result Date: 03/09/2024 CLINICAL DATA:  Follow-up examination for dural venous sinus thrombosis. EXAM: MRI HEAD WITHOUT AND WITH CONTRAST TECHNIQUE: Multiplanar, multiecho pulse sequences of the brain and surrounding structures were obtained without and with intravenous contrast. CONTRAST:  7mL GADAVIST  GADOBUTROL  1 MMOL/ML IV SOLN COMPARISON:  Comparison made with CT venogram performed on the same day as well as multiple previous studies. FINDINGS: Brain: Cerebral volume within normal limits for age. Residual mild diffusion signal abnormality involving the parasagittal perirolandic regions again seen (series 5, images 96-88). Overall, appearance and distribution as compared to previous MRI from 03/02/2024 is not significantly changed or progressed. Mild associated T2/FLAIR signal intensity without ADC correlate. 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 Mild associated petechial blood hemorrhage at the posterior left frontal region (series 12, image 51), stable from prior. No frank hemorrhagic transformation or significant mass effect. Additional subtle diffusion signal abnormality involving the left occipital region (series 5, image 73), also likely evolving subtle venous ischemic changes, not significantly changed in retrospect. No other new areas of acute or evolving ischemia. No other acute intracranial hemorrhage. 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 Preponderance of clot present within the posterior aspect of the superior sagittal sinus, with additional clot in the left transverse and sigmoid sinuses. Subocclusive clot within the right transverse sinus. Probable cavernous sinus thromboses better seen on prior exam. No mass lesion or midline shift. Stable ventricular size and  morphology. No extra-axial fluid collection. Pituitary gland within normal limits. No other pathologic enhancement. Vascular: Persistent dural venous sinus thrombosis as above. Major intracranial arterial vascular flow voids are maintained. Skull and upper cervical spine: Cerebellar tonsils are low lying extending up to 9 mm below the foramen magnum, similar to prior. Bone marrow signal intensity within normal limits. No scalp soft tissue abnormality. Sinuses/Orbits: Subtle bulging of the optic discs at the posterior aspects of the globes, suggesting elevated intracranial pressures. Globes orbital soft tissues otherwise unremarkable. Mild mucosal thickening about the sphenoid ethmoidal sinuses. Paranasal  sinuses are otherwise clear. Trace bilateral mastoid effusions, of doubtful significance. Other: None. IMPRESSION: 1. 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 2. Evolving subacute venous ischemic changes involving the paramedian frontal lobes and likely the left occipital lobe, not significantly changed as compared to previous. Mild associated petechial hemorrhage at the posterior left frontal region, stable from prior. No frank hemorrhagic transformation or significant mass effect. No new or progressive areas of ischemia. 3. Low-lying cerebellar tonsils extending up to 9 mm below the foramen magnum, similar to prior. Electronically Signed   By: Virgia Griffins M.D.   On: 03/09/2024 03:15   CT VENOGRAM HEAD Result Date: 03/08/2024 CLINICAL DATA:  Dural venous sinus thrombosis. Right-sided weakness. EXAM: CT VENOGRAM HEAD TECHNIQUE: Venographic phase images of the brain were obtained following the administration of intravenous contrast. Multiplanar reformats and maximum intensity projections were generated. RADIATION DOSE REDUCTION: This exam was performed according to the departmental dose-optimization program which includes  automated exposure control, adjustment of the mA and/or kV according to patient size and/or use of iterative reconstruction technique. CONTRAST:  OMNIPAQUE  IOHEXOL  350 MG/ML SOLN COMPARISON:  CT venogram 03/02/2024 FINDINGS: Extensive mixed occlusive and subocclusive thrombus in the posterior aspect of the superior sagittal sinus extending to the torcula has mildly progressed from the prior CT venogram. Subocclusive thrombus in the transverse and sigmoid sinuses bilaterally is overall mildly improved. 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 The straight sinus, inferior sagittal sinus, vein of Galen, and internal cerebral veins are patent. Extensive venous collaterals are again noted. These results were called by telephone at the time of interpretation on 03/08/2024 at 2:26 p.m. to Dr. Consuelo Denmark, who verbally acknowledged these results. IMPRESSION: 1. Mild progression of extensive thrombus in the superior sagittal sinus posteriorly. 2. Mild improvement of subocclusive thrombus in the transverse and sigmoid sinuses bilaterally. 3. Persistent bilateral cavernous sinus thrombosis. Electronically Signed   By: Aundra Lee M.D.   On: 03/08/2024 15:59   CT ANGIO HEAD NECK W WO CM W PERF (CODE STROKE) Result Date: 03/08/2024 CLINICAL DATA:  Neuro deficit, acute, stroke suspected. Recent history of extensive dural venous sinus thrombosis. EXAM: CT ANGIOGRAPHY HEAD AND NECK CT PERFUSION BRAIN TECHNIQUE: Multidetector CT imaging of the head and neck was performed using the standard protocol during bolus administration of intravenous contrast. Multiplanar CT image reconstructions and MIPs were obtained to evaluate the vascular anatomy. Carotid stenosis measurements (when applicable) are obtained utilizing NASCET criteria, using the distal internal carotid diameter as the denominator. Multiphase CT imaging of the brain was performed following IV bolus contrast injection. Subsequent parametric perfusion  maps were calculated using RAPID software. RADIATION DOSE REDUCTION: This exam was performed according to the departmental dose-optimization program which includes automated exposure control, adjustment of the mA and/or kV according to patient size and/or use of iterative reconstruction technique. CONTRAST:  OMNIPAQUE  IOHEXOL  350 MG/ML SOLN COMPARISON:  CTA head and neck 02/01/2024.  MRI head 03/02/2024. FINDINGS: CTA NECK FINDINGS Aortic arch: Standard branching. Wide patency of the brachiocephalic and subclavian arteries. Right carotid system: Patent and smooth without evidence of stenosis or dissection. Left carotid system: Patent and smooth without evidence of stenosis or dissection. Vertebral arteries: Patent and smooth without evidence of stenosis or dissection. Strongly dominant left vertebral artery. Skeleton: No acute osseous abnormality or suspicious lesion. Other neck: No evidence of cervical lymphadenopathy or mass. Upper chest: No mass or consolidation in the included lung apices.  Review of the MIP images confirms the above findings CTA HEAD FINDINGS Anterior circulation: The internal carotid arteries are widely patent from skull base to carotid termini. ACAs and MCAs are patent without evidence of a proximal branch occlusion or significant proximal stenosis. No aneurysm is identified. Posterior circulation: The intracranial vertebral arteries are patent with the left being strongly dominant. Patent PICA and SCA origins are visualized bilaterally. The basilar artery is widely patent. There are patent posterior communicating arteries bilaterally. Both PCAs are patent without evidence of a significant proximal stenosis. No aneurysm is identified. Venous sinuses: More fully evaluated on the separately reported dedicated CT venogram. Anatomic variants: None. Review of the MIP images confirms the above findings CT Brain Perfusion Findings: ASPECTS: 10 CBF (<30%) Volume: 0mL Perfusion (Tmax>6.0s)  volume: 21mL, located in the parasagittal frontal lobes bilaterally and in the left cerebellar hemisphere Mismatch Volume: 21mL Infarction Location:No acute core infarct identified by CTP. IMPRESSION: 1. No emergent arterial occlusion or significant stenosis the head and neck. 2. 21 mL of prolonged Tmax in the parasagittal frontal lobes and left cerebellar hemisphere with subacute venous ischemia shown in the frontal lobes on recent MRI. No acute core infarct identified by CT perfusion. Electronically Signed   By: Aundra Lee M.D.   On: 03/08/2024 15:58   CT HEAD CODE STROKE WO CONTRAST Result Date: 03/08/2024 CLINICAL DATA:  Code stroke. Neuro deficit, acute, stroke suspected. Right-sided weakness. Recent history of extensive dural venous sinus thrombosis. EXAM: CT HEAD WITHOUT CONTRAST TECHNIQUE: Contiguous axial images were obtained from the base of the skull through the vertex without intravenous contrast. RADIATION DOSE REDUCTION: This exam was performed according to the departmental dose-optimization program which includes automated exposure control, adjustment of the mA and/or kV according to patient size and/or use of iterative reconstruction technique. COMPARISON:  Head CT, CT venogram, and MRI 03/02/2024 FINDINGS: Brain: There is unchanged mild edema in the parasagittal posterior left frontal lobe corresponding to a known subacute infarct. No definite new infarct, intracranial hemorrhage, mass, midline shift, or extra-axial fluid collection is identified. The ventricles are unchanged in size. 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 Vascular: Known extensive dural venous sinus thrombosis, including hyperdense appearance of the posterior aspect of the superior sagittal sinus extending to the torcula. Skull: No acute fracture or suspicious lesion. Sinuses/Orbits: Paranasal sinuses and mastoid air cells are clear. Unremarkable orbits. Other: None. ASPECTS (Alberta Stroke  Program Early CT Score) - Ganglionic level infarction (caudate, lentiform nuclei, internal capsule, insula, M1-M3 cortex): 7 - Supraganglionic infarction (M4-M6 cortex): 3 Total score (0-10 with 10 being normal): 10 These results were communicated to Dr. Christiane Cowing at 2:24 pm on 03/08/2024 by text page via the [facility] messaging system. IMPRESSION: 1. 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 2. No definite new infarct or intracranial hemorrhage. 3. ASPECTS of 10. Electronically Signed   By: Aundra Lee M.D.   On: 03/08/2024 14:24       LOS: 1 day   Kendan Cornforth  [facility] Pager on [email or web address]  03/09/2024, 9:18 AM

## 2024-03-09 NOTE — Anesthesia Procedure Notes (Signed)
 Procedure Name: Intubation Date/Time: 03/09/2024 5:45 PM  Performed by: Arvie Latus, MDPre-anesthesia Checklist: Patient identified, Emergency Drugs available, Suction available, Patient being monitored and Timeout performed Patient Re-evaluated:Patient Re-evaluated prior to induction Oxygen Delivery Method: Circle system utilized Preoxygenation: Pre-oxygenation with 100% oxygen Induction Type: IV induction Ventilation: Mask ventilation without difficulty Laryngoscope Size: Mac and 3 Grade View: Grade I Tube type: Oral Tube size: 7.5 mm Number of attempts: 1 Airway Equipment and Method: Stylet Secured at: 22 cm Tube secured with: Tape

## 2024-03-09 NOTE — Anesthesia Procedure Notes (Signed)
 Central Venous Catheter Insertion Performed by: Arvie Latus, MD, anesthesiologist Start/End4/29/2025 3:49 PM, 03/09/2024 5:59 PM Patient location: OOR procedure area. Preanesthetic checklist: patient identified, IV checked, site marked, risks and benefits discussed, surgical consent, monitors and equipment checked, pre-op evaluation, timeout performed and anesthesia consent Lidocaine  1% used for infiltration and patient sedated Hand hygiene performed  and maximum sterile barriers used  Catheter size: 8 Fr Total catheter length 16. Central line was placed.Double lumen Procedure performed using ultrasound guided technique. Ultrasound Notes:image(s) printed for medical record Attempts: 1 Following insertion, dressing applied and line sutured. Post procedure assessment: blood return through all ports  Patient tolerated the procedure well with no immediate complications.

## 2024-03-09 NOTE — Progress Notes (Signed)
 LTM EEG hooked up and running - no initial skin breakdown - push button tested - [facility] monitoring.

## 2024-03-09 NOTE — Progress Notes (Signed)
 NAME:  Samuel Warren, MRN:  [id number], DOB:  01-Jun-1991, LOS: 1 ADMISSION DATE:  03/08/2024, CONSULTATION DATE:  03/08/24 REFERRING MD:  Tegeler , CHIEF COMPLAINT:   AMS / apnea   History of Present Illness:   33 yo M hx deafness, venous sinus thrombosis on eliquis , meningoencephalitis presented to ED 4/28 as a code stroke. History is very difficult to obtain -- pt is non-english speaking, is deaf, does not communicate with ASL and there is no family available.   Per report, pt had a witnessed fall at home 4/28 at 0200 by a person who reportedly identified self as wife. Pt remained on the floor until EMS was dispatched 4/28 afternoon for CC not eating. EMS noted R sided weakness, L gaze preference and pt was brought as code stroke. Reportedly pt had 2 apneic spell with EMS and required transient BVM.  CT H without acute abnormality. CTA and CTV were obtained and are not yet read but in d/w neuro it is felt that venous thrombus appear worse.    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 Does not use ASL, we were initially consulted on 4/28 for potential intensive care admission with concern for airway protection at that point the patient was protecting his airway and we did not think his airway was at risk.  He has had progressive mental status change Over the first 24 hours of his hospitalization EEG showing cortical dysfunction from the left hemisphere felt secondary to structural abnormality there was no seizure or epileptiform activity.  On 4/29 nursing staff noted mental status change specifically much less responsive than had been earlier CT head concerning for increasing cerebral edema evaluated urgently by stroke team  PCCM is consulted in setting of reported pre-hospital apnea.    Pertinent  Medical History  Stroke Venous sinus thrombosis Meningoencephalitis HLD GERD Deafness   Significant Hospital Events: Including procedures, antibiotic start and stop dates in addition to other pertinent events    4/28 ED as code stroke. Neuro and PCCM consults. Admit to [facility] followed by stroke team EEG negative for seizure did show a left hemisphere dysfunction, doubt structural in nature, admitted to medicine service, initially protecting airway placed on heparin  infusion 4/29 worsening mental status, Stat CT head obtained reviewed by neurology who felt findings were consistent with worsening cerebral edema plan was to move the patient to the intensive care for hypertonic saline infusion arterial blood gas obtained showing pH of 7.44PCO2 of 26, PO2 of 100, bicarb 17.7 saturation 99.5  Interim History / Subjective:  Awaiting transfer to the intensive care for hypertonic infusion   Objective   Blood pressure 119/86, pulse 66, temperature 99.3 F (37.4 C), temperature source Oral, resp. rate 18, height 5\' 4"  (1.626 m), weight 65 kg, SpO2 100%.        Intake/Output Summary (Last 24 hours) at 03/09/2024 1321 Last data filed at 03/09/2024 0554 Gross per 24 hour  Intake 269.16 ml  Output 600 ml  Net -330.84 ml   Filed Weights   03/08/24 1400  Weight: 65 kg    Examination: General well-nourished 33 year old male.  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 Neuro: He will not follow commands, he is less interactive when compared to prior evaluations per staff.  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  With primary respiratory alkalosis as well as metabolic acidosis currently appears to be protecting his airway.  He is overcompensated for his underlying metabolic acidosis, suspect his tachypnea and hyperventilation he is neurologically mediated Plan Continue pulse oximetry Close clinical monitoring Low threshold for intubation for airway protection Follow-up lactate  Best Practice (right click and "Reselect all SmartList Selections" daily)   Diet/type: NPO DVT prophylaxis systemic heparin  Pressure ulcer(s): pressure ulcer assessment deferred  GI prophylaxis: N/A Lines: N/A Foley:  N/A Code Status:  full code Last date of multidisciplinary goals of care discussion [-- ]  Cct 32 min

## 2024-03-09 NOTE — Progress Notes (Addendum)
 STROKE TEAM PROGRESS NOTE   INTERIM HISTORY/SUBJECTIVE  Decreased mental status on exam, no cough or gag with suctioning, but does move left upper extremity purposefully. RN at bedside. Stat head CT ordered that showed worsening cerebral edema. Plan for transfer to the ICU. Hypertonic saline ordered as well. CCM re-engaged.  OBJECTIVE  CBC    Component Value Date/Time   WBC 10.3 03/09/2024 0543   RBC 5.18 03/09/2024 0543   HGB 13.8 03/09/2024 0543   HCT 41.7 03/09/2024 0543   PLT 245 03/09/2024 0543   MCV 80.5 03/09/2024 0543   MCH 26.6 03/09/2024 0543   MCHC 33.1 03/09/2024 0543   RDW 12.2 03/09/2024 0543   LYMPHSABS 1.5 03/08/2024 1829   MONOABS 0.9 03/08/2024 1829   EOSABS 0.0 03/08/2024 1829   BASOSABS 0.1 03/08/2024 1829    BMET    Component Value Date/Time   NA 141 03/08/2024 1829   K 3.8 03/08/2024 1829   CL 109 03/08/2024 1829   CO2 18 (L) 03/08/2024 1829   GLUCOSE 120 (H) 03/08/2024 1829   BUN 8 03/08/2024 1829   CREATININE 1.09 03/08/2024 1829   CALCIUM  9.3 03/08/2024 1829   GFRNONAA >60 03/08/2024 1829    IMAGING past 24 hours EEG adult Result Date: 03/09/2024 Arleene Lack, MD     03/09/2024  8:35 AM Patient Name: Tolliver Alessandro MRN: [id number] Epilepsy Attending: Arleene Lack Referring Physician/Provider: Laymond Priestly, NP Date: 03/08/2024 Duration: 22.15 mins Patient history: 33yo M with new onset R sided weakness. EEG to evaluate for seizure Level of alertness: Awake AEDs during EEG study: TPM Technical aspects: This EEG study was done with scalp electrodes positioned according to the 10-20 International system of electrode placement. Electrical activity was reviewed with band pass filter of 1-70Hz , sensitivity of 7 uV/mm, display speed of 66mm/sec with a 60Hz  notched filter applied as appropriate. EEG data were recorded continuously and digitally stored.  Video monitoring was available and reviewed as appropriate. Description: The posterior dominant  rhythm consists of 8 Hz activity of moderate voltage (25-35 uV) seen predominantly in posterior head regions, asymmetric ( left<right) and reactive to eye opening and eye closing. EEG showed continuous polymorphic 3 to 6 Hz theta-delta slowing in left hemisphere as well as intermittent generalized 3-5hz  theta-delta slowing.Hyperventilation and photic stimulation were not performed.   ABNORMALITY - Continuous slow, left hemisphere - Intermittent slow, generalized - Background asymmetry, left<right IMPRESSION: This study is suggestive of cortical dysfunction arising from left hemisphere likely secondary to underlying structural abnormality. Additionally there is mild to moderate diffuse encephalopathy. No seizures or epileptiform discharges were seen throughout the recording. Arleene Lack   MR BRAIN W WO CONTRAST Result Date: 03/09/2024 CLINICAL DATA:  Follow-up examination for dural venous sinus thrombosis. EXAM: MRI HEAD WITHOUT AND WITH CONTRAST TECHNIQUE: Multiplanar, multiecho pulse sequences of the brain and surrounding structures were obtained without and with intravenous contrast. CONTRAST:  7mL GADAVIST  GADOBUTROL  1 MMOL/ML IV SOLN COMPARISON:  Comparison made with CT venogram performed on the same day as well as multiple previous studies. FINDINGS: Brain: Cerebral volume within normal limits for age. Residual mild diffusion signal abnormality involving the parasagittal perirolandic regions again seen (series 5, images 96-88). Overall, appearance and distribution as compared to previous MRI from 03/02/2024 is not significantly changed or progressed. Mild associated T2/FLAIR signal intensity without ADC correlate. 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 Mild associated petechial blood hemorrhage at the posterior left frontal region (series 12, image 51), stable from  prior. No frank hemorrhagic transformation or significant mass effect. Additional subtle diffusion signal  abnormality involving the left occipital region (series 5, image 73), also likely evolving subtle venous ischemic changes, not significantly changed in retrospect. No other new areas of acute or evolving ischemia. No other acute intracranial hemorrhage. 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 Preponderance of clot present within the posterior aspect of the superior sagittal sinus, with additional clot in the left transverse and sigmoid sinuses. Subocclusive clot within the right transverse sinus. Probable cavernous sinus thromboses better seen on prior exam. No mass lesion or midline shift. Stable ventricular size and morphology. No extra-axial fluid collection. Pituitary gland within normal limits. No other pathologic enhancement. Vascular: Persistent dural venous sinus thrombosis as above. Major intracranial arterial vascular flow voids are maintained. Skull and upper cervical spine: Cerebellar tonsils are low lying extending up to 9 mm below the foramen magnum, similar to prior. Bone marrow signal intensity within normal limits. No scalp soft tissue abnormality. Sinuses/Orbits: Subtle bulging of the optic discs at the posterior aspects of the globes, suggesting elevated intracranial pressures. Globes orbital soft tissues otherwise unremarkable. Mild mucosal thickening about the sphenoid ethmoidal sinuses. Paranasal sinuses are otherwise clear. Trace bilateral mastoid effusions, of doubtful significance. Other: None. IMPRESSION: 1. 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 2. Evolving subacute venous ischemic changes involving the paramedian frontal lobes and likely the left occipital lobe, not significantly changed as compared to previous. Mild associated petechial hemorrhage at the posterior left frontal region, stable from prior. No  frank hemorrhagic transformation or significant mass effect. No new or progressive areas of ischemia. 3. Low-lying cerebellar tonsils extending up to 9 mm below the foramen magnum, similar to prior. Electronically Signed   By: Virgia Griffins M.D.   On: 03/09/2024 03:15   CT VENOGRAM HEAD Result Date: 03/08/2024 CLINICAL DATA:  Dural venous sinus thrombosis. Right-sided weakness. EXAM: CT VENOGRAM HEAD TECHNIQUE: Venographic phase images of the brain were obtained following the administration of intravenous contrast. Multiplanar reformats and maximum intensity projections were generated. RADIATION DOSE REDUCTION: This exam was performed according to the departmental dose-optimization program which includes automated exposure control, adjustment of the mA and/or kV according to patient size and/or use of iterative reconstruction technique. CONTRAST:  OMNIPAQUE  IOHEXOL  350 MG/ML SOLN COMPARISON:  CT venogram 03/02/2024 FINDINGS: Extensive mixed occlusive and subocclusive thrombus in the posterior aspect of the superior sagittal sinus extending to the torcula has mildly progressed from the prior CT venogram. Subocclusive thrombus in the transverse and sigmoid sinuses bilaterally is overall mildly improved. 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 The straight sinus, inferior sagittal sinus, vein of Galen, and internal cerebral veins are patent. Extensive venous collaterals are again noted. These results were called by telephone at the time of interpretation on 03/08/2024 at 2:26 p.m. to Dr. Consuelo Denmark, who verbally acknowledged these results. IMPRESSION: 1. Mild progression of extensive thrombus in the superior sagittal sinus posteriorly. 2. Mild improvement of subocclusive thrombus in the transverse and sigmoid sinuses bilaterally. 3. Persistent bilateral cavernous sinus thrombosis. Electronically Signed   By: Aundra Lee M.D.   On: 03/08/2024 15:59   CT ANGIO HEAD NECK W WO CM W PERF  (CODE STROKE) Result Date: 03/08/2024 CLINICAL DATA:  Neuro deficit, acute, stroke suspected. Recent history of extensive dural venous sinus thrombosis. EXAM: CT ANGIOGRAPHY HEAD AND NECK  CT PERFUSION BRAIN TECHNIQUE: Multidetector CT imaging of the head and neck was performed using the standard protocol during bolus administration of intravenous contrast. Multiplanar CT image reconstructions and MIPs were obtained to evaluate the vascular anatomy. Carotid stenosis measurements (when applicable) are obtained utilizing NASCET criteria, using the distal internal carotid diameter as the denominator. Multiphase CT imaging of the brain was performed following IV bolus contrast injection. Subsequent parametric perfusion maps were calculated using RAPID software. RADIATION DOSE REDUCTION: This exam was performed according to the departmental dose-optimization program which includes automated exposure control, adjustment of the mA and/or kV according to patient size and/or use of iterative reconstruction technique. CONTRAST:  OMNIPAQUE  IOHEXOL  350 MG/ML SOLN COMPARISON:  CTA head and neck 02/01/2024.  MRI head 03/02/2024. FINDINGS: CTA NECK FINDINGS Aortic arch: Standard branching. Wide patency of the brachiocephalic and subclavian arteries. Right carotid system: Patent and smooth without evidence of stenosis or dissection. Left carotid system: Patent and smooth without evidence of stenosis or dissection. Vertebral arteries: Patent and smooth without evidence of stenosis or dissection. Strongly dominant left vertebral artery. Skeleton: No acute osseous abnormality or suspicious lesion. Other neck: No evidence of cervical lymphadenopathy or mass. Upper chest: No mass or consolidation in the included lung apices. Review of the MIP images confirms the above findings CTA HEAD FINDINGS Anterior circulation: The internal carotid arteries are widely patent from skull base to carotid termini. ACAs and MCAs are patent without  evidence of a proximal branch occlusion or significant proximal stenosis. No aneurysm is identified. Posterior circulation: The intracranial vertebral arteries are patent with the left being strongly dominant. Patent PICA and SCA origins are visualized bilaterally. The basilar artery is widely patent. There are patent posterior communicating arteries bilaterally. Both PCAs are patent without evidence of a significant proximal stenosis. No aneurysm is identified. Venous sinuses: More fully evaluated on the separately reported dedicated CT venogram. Anatomic variants: None. Review of the MIP images confirms the above findings CT Brain Perfusion Findings: ASPECTS: 10 CBF (<30%) Volume: 0mL Perfusion (Tmax>6.0s) volume: 21mL, located in the parasagittal frontal lobes bilaterally and in the left cerebellar hemisphere Mismatch Volume: 21mL Infarction Location:No acute core infarct identified by CTP. IMPRESSION: 1. No emergent arterial occlusion or significant stenosis the head and neck. 2. 21 mL of prolonged Tmax in the parasagittal frontal lobes and left cerebellar hemisphere with subacute venous ischemia shown in the frontal lobes on recent MRI. No acute core infarct identified by CT perfusion. Electronically Signed   By: Aundra Lee M.D.   On: 03/08/2024 15:58   CT HEAD CODE STROKE WO CONTRAST Result Date: 03/08/2024 CLINICAL DATA:  Code stroke. Neuro deficit, acute, stroke suspected. Right-sided weakness. Recent history of extensive dural venous sinus thrombosis. EXAM: CT HEAD WITHOUT CONTRAST TECHNIQUE: Contiguous axial images were obtained from the base of the skull through the vertex without intravenous contrast. RADIATION DOSE REDUCTION: This exam was performed according to the departmental dose-optimization program which includes automated exposure control, adjustment of the mA and/or kV according to patient size and/or use of iterative reconstruction technique. COMPARISON:  Head CT, CT venogram, and MRI  03/02/2024 FINDINGS: Brain: There is unchanged mild edema in the parasagittal posterior left frontal lobe corresponding to a known subacute infarct. No definite new infarct, intracranial hemorrhage, mass, midline shift, or extra-axial fluid collection is identified. The ventricles are unchanged in size. 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 Vascular: Known extensive dural venous sinus thrombosis, including hyperdense appearance of the posterior  aspect of the superior sagittal sinus extending to the torcula. Skull: No acute fracture or suspicious lesion. Sinuses/Orbits: Paranasal sinuses and mastoid air cells are clear. Unremarkable orbits. Other: None. ASPECTS (Alberta Stroke Program Early CT Score) - Ganglionic level infarction (caudate, lentiform nuclei, internal capsule, insula, M1-M3 cortex): 7 - Supraganglionic infarction (M4-M6 cortex): 3 Total score (0-10 with 10 being normal): 10 These results were communicated to Dr. Christiane Cowing at 2:24 pm on 03/08/2024 by text page via the [facility] messaging system. IMPRESSION: 1. 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 2. No definite new infarct or intracranial hemorrhage. 3. ASPECTS of 10. Electronically Signed   By: Aundra Lee M.D.   On: 03/08/2024 14:24    Vitals:   03/08/24 1648 03/08/24 1951 03/09/24 0038 03/09/24 0552  BP: 128/80 131/82 122/77 123/78  Pulse:  (!) 53 (!) 53 (!) 57  Resp: 16 17 17 17   Temp: 97.7 F (36.5 C) 98.7 F (37.1 C) 98.8 F (37.1 C) 99.9 F (37.7 C)  TempSrc: Axillary Axillary Axillary Axillary  SpO2: 100% 100% 99% 100%  Weight:      Height:         PHYSICAL EXAM General:  Alert, well-nourished, well-developed patient in no acute distress Psych:  Mood and affect appropriate for situation CV: Regular rate and rhythm on monitor Respiratory:  Regular, unlabored respirations on room air GI: Abdomen soft and nontender   NEURO:   Mental Status: Patient is lethargic. No verbal output. No mimicking. Does open eye to painful stimuli. 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 III, IV, VI - left gaze preference, can intermittently cross midline V - Facial sensation intact bilaterally. VII - Facial movement intact bilaterally. VIII - deaf X - Palate elevates symmetrically. XI - Chin turning & shoulder shrug intact bilaterally. XII - Tongue protrusion intact.   Motor Strength - right arm and right leg with minimal movement, unable to hold against gravity. Left arm antigravity and spontaneous movement. Left leg not lifting antigravity   Bulk was normal and fasciculations were absent.   Motor Tone - Muscle tone was assessed at the neck and appendages and was normal. Sensory - appears to be intact to noxious stimuli Coordination - unable top assess  Gait and Station - deferred.   ASSESSMENT/PLAN  Mr. Mahmood Sokolow is a 33 y.o. 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 CT head with no acute process. 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 CTA and CTV obtained. Heparin  gtt initiated. NIH on Admission 25  Worsening mental status Patient seems more drowsy sleepy, hard to arouse CTP showed increased cerebral edema Transferred to ICU for 3% saline with bolus Discussed with IR Dr. Alvira Josephs Consider IR after discussion with patient brother  Persistent dural venous sinus thrombus:   Code Stroke CT head -  Unchanged small subacute left frontal lobe infarct in the setting of known dural venous sinus thrombosis. ASPECTS of 10. CTA head & neck No LVO CT Venogram Mild progression of extensive thrombus in the superior sagittal sinus posteriorly. Mild improvement of  subocclusive thrombus in the  transverse and sigmoid sinuses bilaterally. Persistent bilateral cavernous sinus thrombosis. MRI  Evolving subacute venous ischemic changes involving the paramedian  frontal lobes and likely the left occipital lobe, not significantly changed as compared to previous. Mild associated petechial hemorrhage at the posterior left frontal region, stable from prior.  4/29- Stat Head CT generalized brain swelling.  On review, seems patient has more cerebral edema than yesterday. 3/18- 2D Echo - EF 45-50% in 01/2024 EEG- negative for seizure, mild to moderate diffuse encephalopathy LDL 58 HgbA1c 5.7 VTE prophylaxis - Heparin  IV Eliquis  (apixaban ) daily prior to admission, now on heparin  IV  Therapy recommendations:  Pending Disposition:  ICU  Cerebral edema Transferred to ICU on 4/29 due to decreased LOC Hypertonic saline started at 75ml/hr goal sodium 150-155, hold if greater than 160 Serial neurochecks Q6 Na Na 144 --141--136  Hx of CVST Admitted 02/01/2024 for CVST.  Status post IR for thrombectomy and discharged on Eliquis . Readmitted 03/02/2024 for right-sided weakness.  CT head showed increased left frontal venous infarct.  CTV showed recurrent occlusive and subocclusive thrombi within the SSS posteriorly, extending to the confluence of sinus.  Thrombus extending into the medial aspect of the right transverse sinus.  Clot burden within the right sigmoid sinus has decreased.  Patient was concerning for dehydration at the time, and received fluid resuscitation.  Discharged in good condition However, per wife, patient discharged on 4/25 Friday, at the night of that day, patient drowsy sleepy not talking.  Saturday some improvement but not eating and drinking well able to take medication.  Sunday she did a poorly, drowsy sleepy not talking not eating, however wife was able to get medication down.  Monday yesterday, he was sent to ER for evaluation.  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 Small vessel disease in the setting of CNS infection  CSF at that time WBC 22, RBC 1330, protein 91, glucose 80, concerning for viral meningitis Completed course of Doxy No need LP repeat at this time given no sign of CNS infection and especially with cerebral edema and low-lying cerebellar tonsils extending up to 9 mm below the foreman magnum  Hyperlipidemia Home meds:  Atorvastatin  80 LDL in 01/2024 279 This admission LDL 58, goal < 70 Continue statin  Dysphagia Risk of aspiration CXR neg SLP consulted NPO now, on IVF Advance diet as tolerated  Other Stroke Risk Factors ETOH use, alcohol level <15, advised to drink no more than 2 drink(s) a day  Other Active Problems GERD  Hospital day # 1  Patient seen and examined by NP/APP with MD. MD to update note as needed.   Imogene Mana, DNP, FNP-BC [facility] Pager: [phone]  ATTENDING NOTE: I reviewed above note and agree with the assessment and plan. Pt was seen and examined.   Pt was reported decreased LOC this morning.  Hardly to arouse, slow responding to painful stimuli, but still able to open eyes briefly and again sporadically on the left upper extremity.  CT today showed diffuse cerebral edema, seems more pronounced than yesterday.  EEG yesterday no seizure.  Plan for 3% saline with bolus for cerebral edema.  Discussed with Dr. Alvira Josephs for potential IR treatment.  Long-term EEG to rule out seizure.  I had a long discussion with wife through interpreter, and close family church friend, updated pt current condition, treatment plan and potential prognosis, and answered all the questions.  They expressed understanding and appreciation.   For detailed assessment and plan, please refer to above as I have made changes wherever  appropriate.   Consuelo Denmark, MD PhD Stroke Neurology 03/09/2024 5:50  PM  This patient is critically ill due to persistent CVST, cerebral edema, needing thrombectomy and at significant risk of neurological worsening, death form brain herniation, worsening neuro status. This patient's care requires constant monitoring of vital signs, hemodynamics, respiratory and cardiac monitoring, review of multiple databases, neurological assessment, discussion with family, other specialists and medical decision making of high complexity. I spent 60 minutes of neurocritical care time in the care of this patient.    To contact Stroke Continuity provider, please refer to [email or web address]. After hours, contact General Neurology

## 2024-03-09 NOTE — Significant Event (Signed)
 Rapid Response Event Note   Reason for Call :  unresponsive  Initial Focused Assessment:  Patient is very difficult to awake.  He will move his left arm and open his eyes to painful stimuli.  He does have a gag reflex He does not cough on command. Snoring respirations.  Non labored breathing Lung sounds coarse  BP 135/87  HR 60 O2 sat 100% on RA   Dr Christiane Cowing and Sim Dryer NP at bedside assessing patient  Interventions:  Oral care, tested gag refex Stimulation. Paused Heparin  gtt per verbal order Stat head CT ABG  Labs drawn Restarted Heparin  gtt  Dr Lyndon Santiago at bedside to assess patient CCM consult: Twilla Galea NP at bedside to assess patient   Reassessment: BP 118/86  HR 60  RR 18  O2 sat 100% on RA Axillary temp 99.3 Continued decreased LOC   Tx to ICU   Plan of Care:  3% saline   Event Summary:   MD Notified: Dr Christiane Cowing & Dr Lyndon Santiago came to bedside Call Time: 1202 Arrival Time: 1205 End Time:  1355  Samuel Guillaume, RN

## 2024-03-09 NOTE — TOC CM/SW Note (Signed)
 Transition of Care [facility]) - Inpatient Brief Assessment   Patient Details  Name: Samuel Warren MRN: [id number] Date of Birth: [birth year]  Transition of Care [facility]) CM/SW Contact:    Juliane Och, LCSW Phone Number: 03/09/2024, 2:04 PM   Clinical Narrative:  2:05 PM Per chart review, patient is currently fully disoriented. CSW attempted to conduct TOC initial assessment with patient's primary contact/brother, Thoaih. There was no response and a voicemail was left.  Transition of Care Asessment: Insurance and Status: Insurance coverage has been reviewed Patient has primary care physician: Yes Home environment has been reviewed: Private Residence Prior level of function:: N/A Prior/Current Home Services: No current home services Social Drivers of Health Review: SDOH reviewed no interventions necessary (Need updated SDOH for current hospitalization) Readmission risk has been reviewed: Yes Transition of care needs: no transition of care needs at this time

## 2024-03-09 NOTE — Plan of Care (Signed)
 I have brought the possibility of IR to pt wife but she would like to defer the decision to pt brother. I discussed with Dr. Alvira Josephs regarding potential IR for venous thrombosis. Dr. Alvira Josephs would like to talk to pt brother face to face. I attempted to call brother phone he did not pick up. I called pt family close church friend [name], she will try to contact brother to see if he can come to hospital ASAP. Dr. Alvira Josephs will follow up on this issue.   Consuelo Denmark, MD PhD Stroke Neurology 03/09/2024 3:40 PM

## 2024-03-10 ENCOUNTER — Encounter (HOSPITAL_COMMUNITY): Payer: Self-pay | Admitting: Interventional Radiology

## 2024-03-10 ENCOUNTER — Other Ambulatory Visit: Payer: Self-pay

## 2024-03-10 ENCOUNTER — Telehealth: Payer: Self-pay | Admitting: Adult Health

## 2024-03-10 ENCOUNTER — Encounter (HOSPITAL_COMMUNITY)

## 2024-03-10 ENCOUNTER — Inpatient Hospital Stay (HOSPITAL_COMMUNITY)

## 2024-03-10 ENCOUNTER — Inpatient Hospital Stay: Payer: Self-pay

## 2024-03-10 ENCOUNTER — Ambulatory Visit: Admitting: Internal Medicine

## 2024-03-10 DIAGNOSIS — E785 Hyperlipidemia, unspecified: Secondary | ICD-10-CM | POA: Diagnosis not present

## 2024-03-10 DIAGNOSIS — G08 Intracranial and intraspinal phlebitis and thrombophlebitis: Secondary | ICD-10-CM | POA: Diagnosis not present

## 2024-03-10 DIAGNOSIS — R569 Unspecified convulsions: Secondary | ICD-10-CM | POA: Diagnosis not present

## 2024-03-10 DIAGNOSIS — G936 Cerebral edema: Secondary | ICD-10-CM | POA: Diagnosis not present

## 2024-03-10 DIAGNOSIS — R1310 Dysphagia, unspecified: Secondary | ICD-10-CM | POA: Diagnosis not present

## 2024-03-10 DIAGNOSIS — I676 Nonpyogenic thrombosis of intracranial venous system: Secondary | ICD-10-CM | POA: Diagnosis not present

## 2024-03-10 LAB — CBC WITH DIFFERENTIAL/PLATELET
Abs Immature Granulocytes: 0.04 10*3/uL (ref 0.00–0.07)
Basophils Absolute: 0 10*3/uL (ref 0.0–0.1)
Basophils Relative: 0 %
Eosinophils Absolute: 0 10*3/uL (ref 0.0–0.5)
Eosinophils Relative: 0 %
HCT: 37.5 % — ABNORMAL LOW (ref 39.0–52.0)
Hemoglobin: 12.3 g/dL — ABNORMAL LOW (ref 13.0–17.0)
Immature Granulocytes: 0 %
Lymphocytes Relative: 16 %
Lymphs Abs: 1.5 10*3/uL (ref 0.7–4.0)
MCH: 27.3 pg (ref 26.0–34.0)
MCHC: 32.8 g/dL (ref 30.0–36.0)
MCV: 83.1 fL (ref 80.0–100.0)
Monocytes Absolute: 1 10*3/uL (ref 0.1–1.0)
Monocytes Relative: 11 %
Neutro Abs: 6.8 10*3/uL (ref 1.7–7.7)
Neutrophils Relative %: 73 %
Platelets: 224 10*3/uL (ref 150–400)
RBC: 4.51 MIL/uL (ref 4.22–5.81)
RDW: 12.8 % (ref 11.5–15.5)
WBC: 9.3 10*3/uL (ref 4.0–10.5)
nRBC: 0 % (ref 0.0–0.2)

## 2024-03-10 LAB — MAGNESIUM: Magnesium: 1.8 mg/dL (ref 1.7–2.4)

## 2024-03-10 LAB — CBC
HCT: 35.8 % — ABNORMAL LOW (ref 39.0–52.0)
Hemoglobin: 11.7 g/dL — ABNORMAL LOW (ref 13.0–17.0)
MCH: 26.7 pg (ref 26.0–34.0)
MCHC: 32.7 g/dL (ref 30.0–36.0)
MCV: 81.7 fL (ref 80.0–100.0)
Platelets: 229 10*3/uL (ref 150–400)
RBC: 4.38 MIL/uL (ref 4.22–5.81)
RDW: 12.4 % (ref 11.5–15.5)
WBC: 9 10*3/uL (ref 4.0–10.5)
nRBC: 0 % (ref 0.0–0.2)

## 2024-03-10 LAB — SODIUM
Sodium: 146 mmol/L — ABNORMAL HIGH (ref 135–145)
Sodium: 145 mmol/L (ref 135–145)
Sodium: 149 mmol/L — ABNORMAL HIGH (ref 135–145)
Sodium: 146 mmol/L — ABNORMAL HIGH (ref 135–145)

## 2024-03-10 LAB — GLUCOSE, CAPILLARY
Glucose-Capillary: 124 mg/dL — ABNORMAL HIGH (ref 70–99)
Glucose-Capillary: 110 mg/dL — ABNORMAL HIGH (ref 70–99)
Glucose-Capillary: 112 mg/dL — ABNORMAL HIGH (ref 70–99)
Glucose-Capillary: 75 mg/dL (ref 70–99)
Glucose-Capillary: 123 mg/dL — ABNORMAL HIGH (ref 70–99)

## 2024-03-10 LAB — COMPREHENSIVE METABOLIC PANEL WITH GFR
ALT: 41 U/L (ref 0–44)
AST: 25 U/L (ref 15–41)
Albumin: 3.4 g/dL — ABNORMAL LOW (ref 3.5–5.0)
Alkaline Phosphatase: 52 U/L (ref 38–126)
Anion gap: 9 (ref 5–15)
BUN: 8 mg/dL (ref 6–20)
CO2: 17 mmol/L — ABNORMAL LOW (ref 22–32)
Calcium: 8.8 mg/dL — ABNORMAL LOW (ref 8.9–10.3)
Chloride: 122 mmol/L — ABNORMAL HIGH (ref 98–111)
Creatinine, Ser: 0.98 mg/dL (ref 0.61–1.24)
GFR, Estimated: >60 mL/min (ref >60)
Glucose, Bld: 118 mg/dL — ABNORMAL HIGH (ref 70–99)
Potassium: 3.2 mmol/L — ABNORMAL LOW (ref 3.5–5.1)
Sodium: 148 mmol/L — ABNORMAL HIGH (ref 135–145)
Total Bilirubin: 0.5 mg/dL (ref 0.0–1.2)
Total Protein: 6.1 g/dL — ABNORMAL LOW (ref 6.5–8.1)

## 2024-03-10 LAB — APTT
aPTT: 91 s — ABNORMAL HIGH (ref 24–36)
aPTT: 57 s — ABNORMAL HIGH (ref 24–36)

## 2024-03-10 LAB — HEPARIN LEVEL (UNFRACTIONATED): Heparin Unfractionated: >1.1 [IU]/mL — ABNORMAL HIGH (ref 0.30–0.70)

## 2024-03-10 MED ORDER — POTASSIUM CHLORIDE 10 MEQ/50ML IV SOLN
10.0000 meq | INTRAVENOUS | Status: AC
  Administered 2024-03-10 (×4): 10 meq via INTRAVENOUS
  Filled 2024-03-10 (×4): qty 50

## 2024-03-10 MED ORDER — ORAL CARE MOUTH RINSE: 15.0000 mL | OROMUCOSAL | Status: DC | PRN

## 2024-03-10 MED ORDER — HEPARIN (PORCINE) 25000 UT/250ML-% IV SOLN
950.0000 [IU]/h | INTRAVENOUS | Status: DC
  Administered 2024-03-11: 950 [IU]/h via INTRAVENOUS
  Filled 2024-03-10: qty 250

## 2024-03-10 MED ORDER — MAGNESIUM SULFATE 2 GM/50ML IV SOLN
2.0000 g | Freq: Once | INTRAVENOUS | Status: AC
  Administered 2024-03-10: 2 g via INTRAVENOUS
  Filled 2024-03-10: qty 50

## 2024-03-10 MED ORDER — SODIUM CHLORIDE 1 G PO TABS
2.0000 g | ORAL_TABLET | Freq: Three times a day (TID) | ORAL | Status: DC
  Administered 2024-03-10 – 2024-03-18 (×24): 2 g via ORAL
  Filled 2024-03-10 (×25): qty 2

## 2024-03-10 NOTE — Progress Notes (Signed)
 eLink Physician-Brief Progress Note Patient Name: Samuel Warren DOB: Jul 03, 1991 MRN: [id number]   Date of Service  03/10/2024  HPI/Events of Note  K 3.2, Mg 1.8, Cr 0.98  eICU Interventions  40 meq IV kcl 2 gram IV mag I removed yesterdays oral K order which was still showing up , not given due to NPO status     Intervention Category Major Interventions: Electrolyte abnormality - evaluation and management  Zacari Stiff G Endya [place] 03/10/2024, 6:18 AM

## 2024-03-10 NOTE — Progress Notes (Signed)
 PHARMACY - PHYSICIAN COMMUNICATION CRITICAL VALUE ALERT - BLOOD CULTURE IDENTIFICATION (BCID)  Samuel Warren is an 33 y.o. male who presented to [facility] on 03/08/2024 with a chief complaint of venous sinus thrombosis.   Assessment:  Blood culture with GPR in 1/4 bottles, likely contaminant. No systemic signs of infection.  Name of physician (or Provider) Contacted: Fay Hoop, MD  Current antibiotics: none  Changes to prescribed antibiotics recommended:  No antibiotics needed, likely contaminant  No results found for this or any previous visit.  Lewayne Records, PharmD PGY1 Resident 03/10/2024  1:41 PM

## 2024-03-10 NOTE — Progress Notes (Signed)
  Inpatient Rehab Admissions Coordinator :  Per therapy recommendations patient was screened for CIR candidacy by Ottie Glazier RN MSN. Patient is not at a level to tolerate the intensity required to pursue a CIR admit . The CIR admissions team will follow and monitor for progress and place a Rehab Consult order if felt to be appropriate. Please contact me with any questions.  Ottie Glazier RN MSN Admissions Coordinator [phone]

## 2024-03-10 NOTE — Progress Notes (Signed)
 Doc Tea 11-Feb-1991 MRN: [id number]  Patient evaluated post IR on evening of 03/09/2024. 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 Initially admitted to [facility] but then had progressive clinical decline and repeat Head CT shows ongoing cerebral edema, frontal infarct L>R. Transferring to ICU for HTS and worsening encephalopathy.  Patient was taken to IR 4/29. Per Dr Jory Ng note,"Status post bilateral common carotid arteriograms and internal jugular venograms.   Left CFA approach and right common femoral vein approach.   Findings.    Absent flow noted in the posterior one third of the superior sagittal sinus, the transverse sinuses and the sigmoid sinuses bilaterally including the internal jugular veins proximally.   Multiple attempts at revascularization of the proximal internal jugular veins, the sigmoid sinuses and the transverse sinuses unsuccessful due to occluded internal jugular veins at the bulb.   Post CT brain continues to demonstrate extensive venous congestion.  No hemorrhage noted.  Mild prominence of the temporal horns noted.   Manual  compression held at the right common femoral venous puncture site, and a 5 F exoseal  closure device deployed at the left common femoral arterial puncture site.  Distal pulses present unchanged compared to prior to procedure.   Patient extubated.   Pupils 2 mm bilaterally and sluggishly reactive.  No gross facial asymmetry."  Patient seen post intervention. Today's Vitals   03/10/24 0000 03/10/24 0015 03/10/24 0030 03/10/24 0045  BP: 117/80 111/77 108/77   Pulse: (!) 57 (!) 57 (!) 58 (!) 57  Resp: 14 14 13 15   Temp:      TempSrc:      SpO2: 98% 98% 97% 98%  Weight:      Height:      PainSc: Asleep      Body mass index  is 24.6 kg/m.  He is deaf and interpreter in the room He is alert and in NAD At first he was not responding to interpretor and then brother came in and he perked up and started being more responsive Tries to squeeze my fingers with left hand, not able to move right side, wiggles toes Perrla no icterus, tracking and eomi Supple no rigidity CTA no wheezes or rales Reg s1s2 no murmurs or gallops Soft nt nd bs pos No cyanosis, clubbing or edema  A/p Acute encephalopathy Cerebral edema secondary to  Chronic dural venous sinus thrombosis-s/p Multiple attempts at revascularization of the proximal internal jugular veins, the sigmoid sinuses and the transverse sinuses unsuccessful due to occluded internal jugular veins at the bulb. 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

## 2024-03-10 NOTE — Progress Notes (Signed)
 PHARMACY - ANTICOAGULATION CONSULT NOTE  Pharmacy Consult for heparin  Indication:  Venous sinus thrombosis   No Known Allergies  Patient Measurements: Height: 5\' 4"  (162.6 cm) Weight: 65 kg (143 lb 4.8 oz) IBW/kg (Calculated) : 59.2 HEPARIN  DW (KG): 65  Vital Signs: Temp: 97.9 F (36.6 C) (04/30 1600) Temp Source: Oral (04/30 1600) BP: 127/88 (04/30 1845) Pulse Rate: 51 (04/30 1845)  Labs: Recent Labs    03/08/24 1403 03/08/24 1403 03/08/24 1829 03/08/24 2117 03/09/24 0543 03/09/24 0947 03/10/24 0452 03/10/24 1032 03/10/24 1745  HGB 13.6  --  14.6  --  13.8  --  11.7* 12.3*  --   HCT 40.0  --  44.4  --  41.7  --  35.8* 37.5*  --   PLT  --    < > 230  --  245  --  229 224  --   APTT  --    < > 47* 56* 77* 74* 91*  --  57*  LABPROT  --   --  17.1*  --   --   --   --   --   --   INR  --   --  1.4*  --   --   --   --   --   --   HEPARINUNFRC  --   --   --  >1.10* >1.10*  --  >1.10*  --   --   CREATININE 0.80  --  1.09  --   --   --  0.98  --   --   CKTOTAL  --   --  262  --   --   --   --   --   --    < > = values in this interval not displayed.    Estimated Creatinine Clearance: 88.9 mL/min (by C-G formula based on SCr of 0.98 mg/dL).   Medical History: Past Medical History:  Diagnosis Date   Deaf      Assessment: 40 yoM recently admitted with venous sinus thrombosis. Discharged home on Eliquis  (unknown last dose (pt is deaf and family is non-english speaking)), presenting to the ED with worsening right sided weakness. CT shows extension of the previous thrombus. Neurology has asked to start heparin  infusion without bolus.   aPTT 91s is supratherapeutic with heparin  running at 900 units/hr. Hgb (11.7)  has dropped slight, likely dilutional. PLTs (229) are stable. Heparin  level remains > 1.1 in the setting of recent Eliquis  use. Per RN, no report of pauses, issues with the line, or signs of bleeding.   4/30 PM - aPTT 57 sec is subtherapeutic after rate reduction  to 750 units/h.  No issues per RN.    Goal of Therapy:  Heparin  level 0.3-0.5 units/ml aPTT: 66-85 seconds Monitor platelets by anticoagulation protocol: Yes  Plan: Increase IV heparin  to 850 units/hr Check 6 hour aPTT Monitor with aPTT until correlates with heparin  level Continue to monitor H&H and platelets  Cecillia Cogan, PharmD Clinical Pharmacist 03/10/2024  6:54 PM

## 2024-03-10 NOTE — Progress Notes (Addendum)
 STROKE TEAM PROGRESS NOTE   INTERIM HISTORY/SUBJECTIVE Pt wife and brother are at the bedside. Pt much improved today, awake alert tracking bilaterally and moving all extremities, seems symmetrical. Had IR procedure but not able to open venous sinus given subacute nature. Na 148 today.   OBJECTIVE  CBC    Component Value Date/Time   WBC 9.0 03/10/2024 0452   RBC 4.38 03/10/2024 0452   HGB 11.7 (L) 03/10/2024 0452   HCT 35.8 (L) 03/10/2024 0452   PLT 229 03/10/2024 0452   MCV 81.7 03/10/2024 0452   MCH 26.7 03/10/2024 0452   MCHC 32.7 03/10/2024 0452   RDW 12.4 03/10/2024 0452   LYMPHSABS 1.5 03/08/2024 1829   MONOABS 0.9 03/08/2024 1829   EOSABS 0.0 03/08/2024 1829   BASOSABS 0.1 03/08/2024 1829    BMET    Component Value Date/Time   NA 148 (H) 03/10/2024 0452   K 3.2 (L) 03/10/2024 0452   CL 122 (H) 03/10/2024 0452   CO2 17 (L) 03/10/2024 0452   GLUCOSE 118 (H) 03/10/2024 0452   BUN 8 03/10/2024 0452   CREATININE 0.98 03/10/2024 0452   CALCIUM  8.8 (L) 03/10/2024 0452   GFRNONAA >60 03/10/2024 0452    IMAGING past 24 hours US  EKG SITE RITE Result Date: 03/10/2024 If Site Rite image not attached, placement could not be confirmed due to current cardiac rhythm.  Overnight EEG with video Result Date: 03/10/2024 Arleene Lack, MD     03/10/2024  6:03 AM Patient Name: Samuel Warren MRN: [id number] Epilepsy Attending: Arleene Lack Referring Physician/Provider: Consuelo Denmark, MD Duration: 03/09/2024 2212 to 03/10/2024 0600  Patient history: 33yo M with new onset R sided weakness. EEG to evaluate for seizure  Level of alertness: Awake  AEDs during EEG study: TPM  Technical aspects: This EEG study was done with scalp electrodes positioned according to the 10-20 International system of electrode placement. Electrical activity was reviewed with band pass filter of 1-70Hz , sensitivity of 7 uV/mm, display speed of 38mm/sec with a 60Hz  notched filter applied as appropriate. EEG data  were recorded continuously and digitally stored.  Video monitoring was available and reviewed as appropriate.  Description: The posterior dominant rhythm consists of 8 Hz activity of moderate voltage (25-35 uV) seen predominantly in posterior head regions, symmetric and reactive to eye opening and eye closing. EEG showed continuous generalized 3 to 6 Hz theta-delta slowing. Hyperventilation and photic stimulation were not performed.    ABNORMALITY - Continuous slow, generalized  IMPRESSION: This study is suggestive of moderate diffuse encephalopathy. No seizures or epileptiform discharges were seen throughout the recording.  Priyanka Suzanne Erps    CT HEAD WO CONTRAST ( ) Result Date: 03/09/2024 CLINICAL DATA:  Stroke.  Follow-up.  Dural venous sinus thrombosis. EXAM: CT HEAD WITHOUT CONTRAST TECHNIQUE: Contiguous axial images were obtained from the base of the skull through the vertex without intravenous contrast. RADIATION DOSE REDUCTION: This exam was performed according to the departmental dose-optimization program which includes automated exposure control, adjustment of the mA and/or kV according to patient size and/or use of iterative reconstruction technique. COMPARISON:  CT and MRI studies done yesterday. FINDINGS: Brain: Generalized brain swelling. Focal low density at the frontoparietal vertices, left more than right could relate to venous infarctions. Focal low-density in the left occipital lobe could be subtle evidence of some early venous ischemic changes in that region. No intraparenchymal hemorrhage. No hydrocephalus or extra-axial collection. Vascular: Some areas of persistent hyperdensity in the venous sinuses consistent with the  known venous thrombotic disease. Skull: Negative Sinuses/Orbits: Clear/normal Other: None IMPRESSION: 1. Generalized brain swelling. Focal low density at the frontoparietal vertices, left more than right, could relate to venous infarctions. Focal low-density in the left  occipital lobe could be subtle evidence of some early venous ischemic changes in that region. No intraparenchymal hemorrhage. 2. Some areas of persistent hyperdensity in the venous sinuses consistent with the known venous thrombotic disease. Electronically Signed   By: Bettylou Brunner M.D.   On: 03/09/2024 14:10    Vitals:   03/10/24 0800 03/10/24 0900 03/10/24 0937 03/10/24 1000  BP: 121/76 131/80 131/80 131/81  Pulse: (!) 51 (!) 53  (!) 47  Resp: 16 20    Temp:      TempSrc:      SpO2: 99% 100%    Weight:      Height:         PHYSICAL EXAM General:  Alert, well-nourished, well-developed patient in no acute distress Psych:  Mood and affect appropriate for situation CV: Regular rate and rhythm on monitor Respiratory:  Regular, unlabored respirations on room air GI: Abdomen soft and nontender NEURO:  Pt is awake, alert, eyes open, smile to provider. No gaze palsy, tracking bilaterally, blinking to visual threat bilaterally, PERRL. No facial droop. Tongue midline. Bilateral UEs 5/5, no drift. Bilaterally LEs 5/5, no drift. b/l FTN intact, gait not tested.     ASSESSMENT/PLAN  Samuel Warren is a 33 y.o. 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 CT head with no acute process. 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 CTA and CTV obtained. Heparin  gtt initiated. NIH on Admission 25  Worsening mental status, improved Patient seems more drowsy sleepy, hard to arouse on 4/29 CT repeat 4/29 showed increased cerebral edema Transferred to ICU for 3% saline with bolus Discussed with IR Dr. Alvira Josephs S/p IR but not successful Mental status much improved 4/30 Continue 3% saline  On LTM EEG - no seizure   Persistent dural venous sinus thrombus:   Code Stroke CT head -  Unchanged small subacute left  frontal lobe infarct in the setting of known dural venous sinus thrombosis. ASPECTS of 10. CTA head & neck No LVO CT Venogram Mild progression of extensive thrombus in the superior sagittal sinus posteriorly. Mild improvement of subocclusive thrombus in the  transverse and sigmoid sinuses bilaterally. Persistent bilateral cavernous sinus thrombosis. MRI  Evolving subacute venous ischemic changes involving the paramedian frontal lobes and likely the left occipital lobe, not significantly changed as compared to previous. Mild associated petechial hemorrhage at the posterior left frontal region, stable from prior.  4/29- Stat Head CT generalized brain swelling.  On review, seems patient has more cerebral edema than 4/28 3/18- 2D Echo - EF 45-50% in 01/2024 EEG- negative for seizure, mild to moderate diffuse encephalopathy LDL 58 HgbA1c 5.7 VTE prophylaxis - Heparin  IV Eliquis  (apixaban ) daily prior to admission, now on heparin  IV  Therapy recommendations:  Pending Disposition:  ICU  Cerebral edema Transferred to ICU on 4/29 due to decreased LOC Hypertonic saline started at 75ml/hr goal sodium 150-155, hold if greater than 160 Serial neurochecks Q6 Na Na 144 --141--[id number] Add Na tab 2g tid  Hx of CVST Admitted 02/01/2024 for CVST.  Status post IR for thrombectomy and discharged on Eliquis . Readmitted 03/02/2024 for right-sided weakness.  CT head showed increased left frontal  venous infarct.  CTV showed recurrent occlusive and subocclusive thrombi within the SSS posteriorly, extending to the confluence of sinus.  Thrombus extending into the medial aspect of the right transverse sinus.  Clot burden within the right sigmoid sinus has decreased.  Patient was concerning for dehydration at the time, and received fluid resuscitation.  Discharged in good condition However, per wife, patient discharged on 4/25 Friday, at the night of that day, patient drowsy sleepy not talking.  Saturday some  improvement but not eating and drinking well able to take medication.  Sunday she did a poorly, drowsy sleepy not talking not eating, however wife was able to get medication down.  Monday yesterday, he was sent to ER for evaluation.  Hx of Stroke Recent viral meningitis January 27, 2024 right frontal 2-3 punctate infarcts, etiology: Likely due to infectious vasculitis vs. Small vessel disease in the setting of CNS infection  CSF at that time WBC 22, RBC 1330, protein 91, glucose 80, concerning for viral meningitis Completed course of Doxy No need LP repeat at this time given no sign of CNS infection and especially with cerebral edema and low-lying cerebellar tonsils extending up to 9 mm below the foreman magnum  Hyperlipidemia Home meds:  Atorvastatin  80 LDL in 01/2024 279 This admission LDL 58, goal < 70 Continue statin  Dysphagia SLP consulted On dys 3 and thin liquid Advance diet as tolerated  Other Stroke Risk Factors   Other Active Problems Hypokalemia K 3.2 - supplement  Hospital day # 2    Consuelo Denmark, MD PhD Stroke Neurology 03/10/2024 10:50 AM  This patient is critically ill due to persistent CVST, cerebral edema, needing thrombectomy and at significant risk of neurological worsening, death form brain herniation, worsening neuro status. This patient's care requires constant monitoring of vital signs, hemodynamics, respiratory and cardiac monitoring, review of multiple databases, neurological assessment, discussion with family, other specialists and medical decision making of high complexity. I spent 40 minutes of neurocritical care time in the care of this patient. I had long discussion with brother at bedside, updated pt current condition, treatment plan and potential prognosis, and answered all the questions. He expressed understanding and appreciation. I also discussed with CCM Dr. Waylan Haggard     To contact Stroke Continuity provider, please refer to [email or web address]. After  hours, contact General Neurology

## 2024-03-10 NOTE — Progress Notes (Signed)
 Speech Language Pathology Treatment: Dysphagia  Patient Details Name: Samuel Warren MRN: [id number] DOB: 05-13-1991 Today's Date: 03/10/2024 Time: 1330-1405 SLP Time Calculation (min) (ACUTE ONLY): 35 min  Assessment / Plan / Recommendation Clinical Impression  Patient seen by SLP for skilled treatment focused on dysphagia goals. His spouse was present in the room as well as live ASL interpreter and live [place] interpreter. Patient was awake, alert, and significantly more interactive than he was previous date. As compared to prior admission when this SLP worked with patient (01/28/24), patient is less interactive, more distracted and with significantly less attempts to communicate via ASL or gestures. He was able to feed self chocolate pudding with spoon but his grip on spoon was decreased. He drank thin liquids (water) via cup and via straw sip, exhibiting instances of delayed swallow initiation but with no overt s/s aspiration observed during or after PO intake. With graham crackers, he exhibited mildly prolonged mastication. SLP recommending to initiate PO diet of Dys 3 (mechanical soft) solids and thin liquids.  Of note, patient's spouse told SLP via interpreter that following previous stroke, patient's overall PO intake decreased. This was in part due to his mother telling him he shouldn't eat certain foods that she felt cause stroke. (Sugary foods and drinks, salty/fatty foods like pizza, etc) After eating graham crackers and chocolate pudding, patient indicated that he was full. Will need to monitor PO intake. SLP will continue to follow.   HPI HPI: Patient is a 33 y.o. male with PMH: deafness (no formal language system), GERD, CVA, venous sinus thrombosis, meningoencephalitis, recently discharged from [facility] on 03/05/24 for right sided weakness. He returned to the ED o n 03/08/24 after being found down with new onset right sided weakness. MRI brain which showed persistent findings of extensive dural  venous sinus thrombosis and Evolving subacute versus venous ischemic changes were also noted. He has been NPO awaiting SLP swallow evaluation.      SLP Plan  Continue with current plan of care      Recommendations for follow up therapy are one component of a multi-disciplinary discharge planning process, led by the attending physician.  Recommendations may be updated based on patient status, additional functional criteria and insurance authorization.    Recommendations  Diet recommendations: Dysphagia 3 (mechanical soft);Thin liquid Liquids provided via: Cup;Straw Medication Administration: Other (Comment) (whole with puree or liquids) Supervision: Patient able to self feed;Intermittent supervision to cue for compensatory strategies Compensations: Slow rate;Small sips/bites;Minimize environmental distractions Postural Changes and/or Swallow Maneuvers: Seated upright 90 degrees                  Oral care BID;Staff/trained caregiver to provide oral care   Intermittent Supervision/Assistance Dysphagia, unspecified (R13.10)     Continue with current plan of care    Jacqualine Mater, MA, CCC-SLP Speech Therapy

## 2024-03-10 NOTE — Evaluation (Signed)
 Physical Therapy Evaluation Patient Details Name: Samuel Warren MRN: [id number] DOB: Dec 17, 1990 Today's Date: 03/10/2024  History of Present Illness  Pt is a 33 y/o M presenting to ED on 4/29 after being found on the ground with R weakness, CT head with unchanged small subacute L frontal lobe infarct, CTA with worsening venous thrombus. RR called 4/29 and transferred to ICU for worsening encephalopathy. Recent admission for similar, found to be orthostatic and dehydrated 4/25. S/p bil common carotid angiograms and internal jugular venograms on 4/29, unsuccessful thrombectomy. 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 Pt needs skilled PT to maximize independence and safety. Pt with a decline in function since recent admission. Patient will benefit from intensive inpatient follow-up therapy, >3 hours/day.           If plan is discharge home, recommend the following: A lot of help with walking and/or transfers;A lot of help with bathing/dressing/bathroom;Assistance with cooking/housework;Assist for transportation;Help with stairs or ramp for entrance   Can travel by private vehicle        Equipment Recommendations Wheelchair (measurements PT);Wheelchair cushion (measurements PT)  Recommendations for Other Services       Functional Status Assessment Patient has had a recent decline in their functional status and demonstrates the ability to make significant improvements in function in a reasonable and predictable amount of time.     Precautions / Restrictions Precautions Precautions: Fall Recall of Precautions/Restrictions: Intact Restrictions Weight Bearing Restrictions Per Provider Order: No      Mobility  Bed Mobility Overal bed mobility: Needs Assistance Bed Mobility:  Sidelying to Sit, Sit to Supine   Sidelying to sit: Mod assist, +2 for physical assistance   Sit to supine: Max assist, +2 for physical assistance        Transfers                   General transfer comment: deferred 2/2 headache and fatigue    Ambulation/Gait                  Stairs            Wheelchair Mobility     Tilt Bed    Modified Rankin (Stroke Patients Only)       Balance Overall balance assessment: Needs assistance Sitting-balance support: No upper extremity supported, Feet unsupported Sitting balance-Leahy Scale: Poor Sitting balance - Comments: posterior and L lateral lean                                     Pertinent Vitals/Pain Pain Assessment Pain Assessment: Faces Faces Pain Scale: Hurts even more Pain Location: posterior headache Pain Descriptors / Indicators: Grimacing Pain Intervention(s): Monitored during session, Repositioned    Home Living Family/patient expects to be discharged to:: Private residence Living Arrangements: Parent;Other relatives Available Help at Discharge: Family;Available 24 hours/day Type of Home: House Home Access: Stairs to enter Entrance Stairs-Rails: Lawyer of Steps: 7   Home Layout: One level Home Equipment: Agricultural consultant (2 wheels)      Prior Function Prior Level of Function : Needs assist             Mobility Comments: RW ADLs Comments: working with OP OT PTA, supervision for ADLs per prior  therapy note, working at [employer] prior to previous 2 hospitalizations     Extremity/Trunk Assessment   Upper Extremity Assessment Upper Extremity Assessment: RUE deficits/detail RUE Deficits / Details: 3/5 lifts against gravity RUE Sensation: decreased light touch;decreased proprioception RUE Coordination: decreased fine motor;decreased gross motor    Lower Extremity Assessment Lower Extremity Assessment: Generalized weakness (Rt  weaker than lt)    Cervical / Trunk Assessment Cervical / Trunk Assessment: Normal  Communication   Communication Communication: Impaired Factors Affecting Communication: Hearing impaired;Other (comment) (sign lanaguage interpreter available but no formal language system at baseline)    Cognition Arousal: Alert Behavior During Therapy: WFL for tasks assessed/performed   PT - Cognitive impairments: Difficult to assess Difficult to assess due to: Hard of hearing/deaf, Non-English speaking                     PT - Cognition Comments: Pt is deaf and has no formal language system. ASL interpreter present who is familiar with pt and who has established some communication. In person interpreter also present for wife. Following commands: Intact Following commands impaired: Only follows one step commands consistently (with gestures/tactile cues)     Cueing Cueing Techniques: Gestural cues, Verbal cues     General Comments General comments (skin integrity, edema, etc.): VSS on RA    Exercises     Assessment/Plan    PT Assessment Patient needs continued PT services  PT Problem List Decreased strength;Decreased activity tolerance;Decreased balance;Decreased mobility       PT Treatment Interventions DME instruction;Gait training;Therapeutic activities;Functional mobility training;Therapeutic exercise;Balance training;Patient/family education;Neuromuscular re-education    PT Goals (Current goals can be found in the Care Plan section)  Acute Rehab PT Goals Patient Stated Goal: not stated PT Goal Formulation: Patient unable to participate in goal setting Time For Goal Achievement: 03/24/24 Potential to Achieve Goals: Fair    Frequency Min 2X/week     Co-evaluation PT/OT/SLP Co-Evaluation/Treatment: Yes Reason for Co-Treatment: For patient/therapist safety;Complexity of the patient's impairments (multi-system involvement) (scheduled interpreter) PT goals addressed during  session: Mobility/safety with mobility;Balance OT goals addressed during session: ADL's and self-care       AM-PAC PT "6 Clicks" Mobility  Outcome Measure Help needed turning from your back to your side while in a flat bed without using bedrails?: A Lot Help needed moving from lying on your back to sitting on the side of a flat bed without using bedrails?: Total Help needed moving to and from a bed to a chair (including a wheelchair)?: Total Help needed standing up from a chair using your arms (e.g., wheelchair or bedside chair)?: Total Help needed to walk in hospital room?: Total Help needed climbing 3-5 steps with a railing? : Total 6 Click Score: 7    End of Session Equipment Utilized During Treatment: Gait belt Activity Tolerance: Patient limited by pain Patient left: in bed;with call bell/phone within reach;with bed alarm set;with family/visitor present Nurse Communication: Mobility status PT Visit Diagnosis: Other abnormalities of gait and mobility (R26.89);Muscle weakness (generalized) (M62.81)    Time: 4098-1191 PT Time Calculation (min) (ACUTE ONLY): 19 min   Charges:   PT Evaluation $PT Eval Moderate Complexity: 1 Mod   PT General Charges $$ ACUTE PT VISIT: 1 Visit         [facility] PT Acute Rehabilitation Services Office [phone]   Pura Browns [facility] 03/10/2024, 4:21 PM

## 2024-03-10 NOTE — Progress Notes (Signed)
 LTM maint complete - no skin breakdown under:  FP2,GRND,C3,T7

## 2024-03-10 NOTE — Telephone Encounter (Signed)
 Patient church member Samuel Warren dropped off document FMLA, to be filled out by provider. Samuel Warren  requested pick up  within 7-days. Document is located in providers tray at front office.Please call Cammie Cellar  at [phone]

## 2024-03-10 NOTE — Progress Notes (Signed)
 Referring Physician(s): Dr. Janett Medin  Supervising Physician: Luellen Sages  Patient Status:  [facility] - In-pt  Chief Complaint: Dural venous sinus thrombosis  Subjective: Resting in bed.  Brother available to assist with interpretation.  No concerns.   Allergies: Patient has no known allergies.  Medications: Prior to Admission medications   Medication Sig Start Date End Date Taking? Authorizing Provider  apixaban  (ELIQUIS ) 5 MG TABS tablet Take 1 tablet (5 mg total) by mouth 2 (two) times daily. 02/11/24 03/12/24 Yes Modena Andes, MD  atorvastatin  (LIPITOR ) 80 MG tablet Take 1 tablet (80 mg total) by mouth daily. 02/10/24 03/11/24 Yes Modena Andes, MD  butalbital -acetaminophen -caffeine  (FIORICET ) 50-325-40 MG tablet Take 1 tablet by mouth every 6 (six) hours as needed (refractory HA.). 03/05/24  Yes Leona Rake, MD  ondansetron  (ZOFRAN ) 4 MG tablet Take 1 tablet (4 mg total) by mouth every 6 (six) hours as needed for nausea. 01/29/24  Yes Audria Leather, MD  pantoprazole  (PROTONIX ) 40 MG tablet Take 1 tablet (40 mg total) by mouth daily. Patient taking differently: Take 40 mg by mouth daily before breakfast. 03/05/24  Yes Leona Rake, MD  topiramate  (TOPAMAX ) 25 MG tablet Take 1 tablet (25 mg total) by mouth daily. 03/05/24  Yes Leona Rake, MD     Vital Signs: BP 116/81 (BP Location: Right Arm)   Pulse (!) 50   Temp 98.1 F (36.7 C) (Oral)   Resp 15   Ht 5\' 4"  (1.626 m)   Wt 143 lb 4.8 oz (65 kg)   SpO2 99%   BMI 24.60 kg/m   Physical Exam NAD, alert, occasional restlessness Groin: site intact.  No oozing or bleeding.  Small amount of dried blood on gauze.  No tenderness.  No evidence of hematoma or pseudoaneurysm.   Imaging: US  EKG SITE RITE Result Date: 03/10/2024 If Site Rite image not attached, placement could not be confirmed due to current cardiac rhythm.  Overnight EEG with video Result Date: 03/10/2024 Arleene Lack, MD     03/10/2024  6:03 AM  Patient Name: Ramsay Halloway MRN: [id number] Epilepsy Attending: Arleene Lack Referring Physician/Provider: Consuelo Denmark, MD Duration: 03/09/2024 2212 to 03/10/2024 0600  Patient history: 33yo M with new onset R sided weakness. EEG to evaluate for seizure  Level of alertness: Awake  AEDs during EEG study: TPM  Technical aspects: This EEG study was done with scalp electrodes positioned according to the 10-20 International system of electrode placement. Electrical activity was reviewed with band pass filter of 1-70Hz , sensitivity of 7 uV/mm, display speed of 46mm/sec with a 60Hz  notched filter applied as appropriate. EEG data were recorded continuously and digitally stored.  Video monitoring was available and reviewed as appropriate.  Description: The posterior dominant rhythm consists of 8 Hz activity of moderate voltage (25-35 uV) seen predominantly in posterior head regions, symmetric and reactive to eye opening and eye closing. EEG showed continuous generalized 3 to 6 Hz theta-delta slowing. Hyperventilation and photic stimulation were not performed.    ABNORMALITY - Continuous slow, generalized  IMPRESSION: This study is suggestive of moderate diffuse encephalopathy. No seizures or epileptiform discharges were seen throughout the recording.  Arleene Lack    DG CHEST PORT 1 VIEW Result Date: 03/09/2024 EXAM: 1 VIEW(S) XRAY OF THE CHEST 03/09/2024 10:49:20 AM COMPARISON: None available. CLINICAL HISTORY: [id number] Weakness [id number]. Stroke, weakness; rover Stroke, weakness; rover FINDINGS: LUNGS AND PLEURA: No consolidation. No pulmonary edema. No pleural effusion. No pneumothorax. HEART AND MEDIASTINUM: No acute abnormality  of the cardiac and mediastinal silhouettes. BONES AND SOFT TISSUES: No acute osseous abnormality. IMPRESSION: 1. No acute process. Electronically signed by: [email or web address] 03/09/2024 02:47 PM EDT RP Workstation: [id number]   CT HEAD WO CONTRAST ( ) Result Date: 03/09/2024 CLINICAL DATA:  Stroke.   Follow-up.  Dural venous sinus thrombosis. EXAM: CT HEAD WITHOUT CONTRAST TECHNIQUE: Contiguous axial images were obtained from the base of the skull through the vertex without intravenous contrast. RADIATION DOSE REDUCTION: This exam was performed according to the departmental dose-optimization program which includes automated exposure control, adjustment of the mA and/or kV according to patient size and/or use of iterative reconstruction technique. COMPARISON:  CT and MRI studies done yesterday. FINDINGS: Brain: Generalized brain swelling. Focal low density at the frontoparietal vertices, left more than right could relate to venous infarctions. Focal low-density in the left occipital lobe could be subtle evidence of some early venous ischemic changes in that region. No intraparenchymal hemorrhage. No hydrocephalus or extra-axial collection. Vascular: Some areas of persistent hyperdensity in the venous sinuses consistent with the known venous thrombotic disease. Skull: Negative Sinuses/Orbits: Clear/normal Other: None IMPRESSION: 1. Generalized brain swelling. Focal low density at the frontoparietal vertices, left more than right, could relate to venous infarctions. Focal low-density in the left occipital lobe could be subtle evidence of some early venous ischemic changes in that region. No intraparenchymal hemorrhage. 2. Some areas of persistent hyperdensity in the venous sinuses consistent with the known venous thrombotic disease. Electronically Signed   By: Bettylou Brunner M.D.   On: 03/09/2024 14:10   EEG adult Result Date: 03/09/2024 Arleene Lack, MD     03/09/2024  8:35 AM Patient Name: Shamarion Crupi MRN: [id number] Epilepsy Attending: Arleene Lack Referring Physician/Provider: Laymond Priestly, NP Date: 03/08/2024 Duration: 22.15 mins Patient history: 33yo M with new onset R sided weakness. EEG to evaluate for seizure Level of alertness: Awake AEDs during EEG study: TPM Technical aspects: This EEG study  was done with scalp electrodes positioned according to the 10-20 International system of electrode placement. Electrical activity was reviewed with band pass filter of 1-70Hz , sensitivity of 7 uV/mm, display speed of 27mm/sec with a 60Hz  notched filter applied as appropriate. EEG data were recorded continuously and digitally stored.  Video monitoring was available and reviewed as appropriate. Description: The posterior dominant rhythm consists of 8 Hz activity of moderate voltage (25-35 uV) seen predominantly in posterior head regions, asymmetric ( left<right) and reactive to eye opening and eye closing. EEG showed continuous polymorphic 3 to 6 Hz theta-delta slowing in left hemisphere as well as intermittent generalized 3-5hz  theta-delta slowing.Hyperventilation and photic stimulation were not performed.   ABNORMALITY - Continuous slow, left hemisphere - Intermittent slow, generalized - Background asymmetry, left<right IMPRESSION: This study is suggestive of cortical dysfunction arising from left hemisphere likely secondary to underlying structural abnormality. Additionally there is mild to moderate diffuse encephalopathy. No seizures or epileptiform discharges were seen throughout the recording. Arleene Lack   MR BRAIN W WO CONTRAST Result Date: 03/09/2024 CLINICAL DATA:  Follow-up examination for dural venous sinus thrombosis. EXAM: MRI HEAD WITHOUT AND WITH CONTRAST TECHNIQUE: Multiplanar, multiecho pulse sequences of the brain and surrounding structures were obtained without and with intravenous contrast. CONTRAST:  7mL GADAVIST  GADOBUTROL  1 MMOL/ML IV SOLN COMPARISON:  Comparison made with CT venogram performed on the same day as well as multiple previous studies. FINDINGS: Brain: Cerebral volume within normal limits for age. Residual mild diffusion signal abnormality involving the parasagittal perirolandic regions again seen (  series 5, images 96-88). Overall, appearance and distribution as compared to  previous MRI from 03/02/2024 is not significantly changed or progressed. Mild associated T2/FLAIR signal intensity without ADC correlate. 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 Mild associated petechial blood hemorrhage at the posterior left frontal region (series 12, image 51), stable from prior. No frank hemorrhagic transformation or significant mass effect. Additional subtle diffusion signal abnormality involving the left occipital region (series 5, image 73), also likely evolving subtle venous ischemic changes, not significantly changed in retrospect. No other new areas of acute or evolving ischemia. No other acute intracranial hemorrhage. 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 Preponderance of clot present within the posterior aspect of the superior sagittal sinus, with additional clot in the left transverse and sigmoid sinuses. Subocclusive clot within the right transverse sinus. Probable cavernous sinus thromboses better seen on prior exam. No mass lesion or midline shift. Stable ventricular size and morphology. No extra-axial fluid collection. Pituitary gland within normal limits. No other pathologic enhancement. Vascular: Persistent dural venous sinus thrombosis as above. Major intracranial arterial vascular flow voids are maintained. Skull and upper cervical spine: Cerebellar tonsils are low lying extending up to 9 mm below the foramen magnum, similar to prior. Bone marrow signal intensity within normal limits. No scalp soft tissue abnormality. Sinuses/Orbits: Subtle bulging of the optic discs at the posterior aspects of the globes, suggesting elevated intracranial pressures. Globes orbital soft tissues otherwise unremarkable. Mild mucosal thickening about the sphenoid ethmoidal sinuses. Paranasal sinuses are otherwise clear. Trace bilateral mastoid effusions, of doubtful significance.  Other: None. IMPRESSION: 1. 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 2. Evolving subacute venous ischemic changes involving the paramedian frontal lobes and likely the left occipital lobe, not significantly changed as compared to previous. Mild associated petechial hemorrhage at the posterior left frontal region, stable from prior. No frank hemorrhagic transformation or significant mass effect. No new or progressive areas of ischemia. 3. Low-lying cerebellar tonsils extending up to 9 mm below the foramen magnum, similar to prior. Electronically Signed   By: Virgia Griffins M.D.   On: 03/09/2024 03:15   CT VENOGRAM HEAD Result Date: 03/08/2024 CLINICAL DATA:  Dural venous sinus thrombosis. Right-sided weakness. EXAM: CT VENOGRAM HEAD TECHNIQUE: Venographic phase images of the brain were obtained following the administration of intravenous contrast. Multiplanar reformats and maximum intensity projections were generated. RADIATION DOSE REDUCTION: This exam was performed according to the departmental dose-optimization program which includes automated exposure control, adjustment of the mA and/or kV according to patient size and/or use of iterative reconstruction technique. CONTRAST:  OMNIPAQUE  IOHEXOL  350 MG/ML SOLN COMPARISON:  CT venogram 03/02/2024 FINDINGS: Extensive mixed occlusive and subocclusive thrombus in the posterior aspect of the superior sagittal sinus extending to the torcula has mildly progressed from the prior CT venogram. Subocclusive thrombus in the transverse and sigmoid sinuses bilaterally is overall mildly improved. 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 The straight sinus, inferior sagittal sinus, vein of Galen, and internal cerebral veins are patent. Extensive venous collaterals are again noted. These results were called by telephone at the time of interpretation on 03/08/2024 at 2:26  p.m. to Dr. Consuelo Denmark, who verbally acknowledged these results. IMPRESSION: 1. Mild progression of extensive thrombus in the superior sagittal sinus posteriorly. 2. Mild improvement of subocclusive thrombus in the transverse and sigmoid sinuses bilaterally. 3. Persistent bilateral cavernous sinus  thrombosis. Electronically Signed   By: Aundra Lee M.D.   On: 03/08/2024 15:59   CT ANGIO HEAD NECK W WO CM W PERF (CODE STROKE) Result Date: 03/08/2024 CLINICAL DATA:  Neuro deficit, acute, stroke suspected. Recent history of extensive dural venous sinus thrombosis. EXAM: CT ANGIOGRAPHY HEAD AND NECK CT PERFUSION BRAIN TECHNIQUE: Multidetector CT imaging of the head and neck was performed using the standard protocol during bolus administration of intravenous contrast. Multiplanar CT image reconstructions and MIPs were obtained to evaluate the vascular anatomy. Carotid stenosis measurements (when applicable) are obtained utilizing NASCET criteria, using the distal internal carotid diameter as the denominator. Multiphase CT imaging of the brain was performed following IV bolus contrast injection. Subsequent parametric perfusion maps were calculated using RAPID software. RADIATION DOSE REDUCTION: This exam was performed according to the departmental dose-optimization program which includes automated exposure control, adjustment of the mA and/or kV according to patient size and/or use of iterative reconstruction technique. CONTRAST:  OMNIPAQUE  IOHEXOL  350 MG/ML SOLN COMPARISON:  CTA head and neck 02/01/2024.  MRI head 03/02/2024. FINDINGS: CTA NECK FINDINGS Aortic arch: Standard branching. Wide patency of the brachiocephalic and subclavian arteries. Right carotid system: Patent and smooth without evidence of stenosis or dissection. Left carotid system: Patent and smooth without evidence of stenosis or dissection. Vertebral arteries: Patent and smooth without evidence of stenosis or dissection. Strongly dominant  left vertebral artery. Skeleton: No acute osseous abnormality or suspicious lesion. Other neck: No evidence of cervical lymphadenopathy or mass. Upper chest: No mass or consolidation in the included lung apices. Review of the MIP images confirms the above findings CTA HEAD FINDINGS Anterior circulation: The internal carotid arteries are widely patent from skull base to carotid termini. ACAs and MCAs are patent without evidence of a proximal branch occlusion or significant proximal stenosis. No aneurysm is identified. Posterior circulation: The intracranial vertebral arteries are patent with the left being strongly dominant. Patent PICA and SCA origins are visualized bilaterally. The basilar artery is widely patent. There are patent posterior communicating arteries bilaterally. Both PCAs are patent without evidence of a significant proximal stenosis. No aneurysm is identified. Venous sinuses: More fully evaluated on the separately reported dedicated CT venogram. Anatomic variants: None. Review of the MIP images confirms the above findings CT Brain Perfusion Findings: ASPECTS: 10 CBF (<30%) Volume: 0mL Perfusion (Tmax>6.0s) volume: 21mL, located in the parasagittal frontal lobes bilaterally and in the left cerebellar hemisphere Mismatch Volume: 21mL Infarction Location:No acute core infarct identified by CTP. IMPRESSION: 1. No emergent arterial occlusion or significant stenosis the head and neck. 2. 21 mL of prolonged Tmax in the parasagittal frontal lobes and left cerebellar hemisphere with subacute venous ischemia shown in the frontal lobes on recent MRI. No acute core infarct identified by CT perfusion. Electronically Signed   By: Aundra Lee M.D.   On: 03/08/2024 15:58   CT HEAD CODE STROKE WO CONTRAST Result Date: 03/08/2024 CLINICAL DATA:  Code stroke. Neuro deficit, acute, stroke suspected. Right-sided weakness. Recent history of extensive dural venous sinus thrombosis. EXAM: CT HEAD WITHOUT CONTRAST  TECHNIQUE: Contiguous axial images were obtained from the base of the skull through the vertex without intravenous contrast. RADIATION DOSE REDUCTION: This exam was performed according to the departmental dose-optimization program which includes automated exposure control, adjustment of the mA and/or kV according to patient size and/or use of iterative reconstruction technique. COMPARISON:  Head CT, CT venogram, and MRI 03/02/2024 FINDINGS: Brain: There is unchanged mild edema in the parasagittal posterior left  frontal lobe corresponding to a known subacute infarct. No definite new infarct, intracranial hemorrhage, mass, midline shift, or extra-axial fluid collection is identified. The ventricles are unchanged in size. 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 Vascular: Known extensive dural venous sinus thrombosis, including hyperdense appearance of the posterior aspect of the superior sagittal sinus extending to the torcula. Skull: No acute fracture or suspicious lesion. Sinuses/Orbits: Paranasal sinuses and mastoid air cells are clear. Unremarkable orbits. Other: None. ASPECTS (Alberta Stroke Program Early CT Score) - Ganglionic level infarction (caudate, lentiform nuclei, internal capsule, insula, M1-M3 cortex): 7 - Supraganglionic infarction (M4-M6 cortex): 3 Total score (0-10 with 10 being normal): 10 These results were communicated to Dr. Christiane Cowing at 2:24 pm on 03/08/2024 by text page via the [facility] messaging system. IMPRESSION: 1. 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 2. No definite new infarct or intracranial hemorrhage. 3. ASPECTS of 10. Electronically Signed   By: Aundra Lee M.D.   On: 03/08/2024 14:24    Labs:  CBC: Recent Labs    03/08/24 1829 03/09/24 0543 03/10/24 0452 03/10/24 1032  WBC 12.6* 10.3 9.0 9.3  HGB 14.6 13.8 11.7* 12.3*  HCT 44.4 41.7 35.8* 37.5*  PLT 230 245 229 224     COAGS: Recent Labs    02/01/24 1811 03/02/24 1611 03/08/24 1829 03/08/24 2117 03/09/24 0543 03/09/24 0947 03/10/24 0452  INR 1.0 1.4* 1.4*  --   --   --   --   APTT 23* 35 47* 56* 77* 74* 91*    BMP: Recent Labs    03/03/24 1057 03/04/24 0612 03/08/24 1403 03/08/24 1829 03/09/24 1330 03/09/24 2235 03/10/24 0116 03/10/24 0452 03/10/24 1300  NA 145 141 144 141   < > 146* 145 148* 149*  K 3.6 3.5 3.6 3.8  --   --   --  3.2*  --   CL 125* 111 115* 109  --   --   --  122*  --   CO2 14* 18*  --  18*  --   --   --  17*  --   GLUCOSE 95 103* 111* 120*  --   --   --  118*  --   BUN <5* <5* 7 8  --   --   --  8  --   CALCIUM  6.7* 9.2  --  9.3  --   --   --  8.8*  --   CREATININE 0.66 1.10 0.80 1.09  --   --   --  0.98  --   GFRNONAA >60 >60  --  >60  --   --   --  >60  --    < > = values in this interval not displayed.    LIVER FUNCTION TESTS: Recent Labs    02/01/24 1811 03/02/24 1611 03/08/24 1829 03/10/24 0452  BILITOT 1.3* 0.7 0.8 0.5  AST 20 30 25 25   ALT 76* 54* 42 41  ALKPHOS 44 51 66 52  PROT 7.6 6.9 7.3 6.1*  ALBUMIN 4.0 4.0 4.2 3.4*    Assessment and Plan: Dural venous sinus thrombosis.  S/p angiogram with intent to intervention, however thrombosis unsurpassable.  Patient resting comfortably in bed.  Wife at bedside. Brother available.   No questions at this time.  Groin site assess.  Intact.  No issues noted.  Further care per Neurology.   Electronically Signed: Bently Wyss Sue-Ellen Dreama Kuna,  PA 03/10/2024, 3:12 PM   I spent a total of 15 Minutes at the the patient's bedside AND on the patient's hospital floor or unit, greater than 50% of which was counseling/coordinating care for dural venous sinus thrombosis.

## 2024-03-10 NOTE — Progress Notes (Addendum)
 NAME:  Samuel Warren, MRN:  [id number], DOB:  05-Jul-1991, LOS: 2 ADMISSION DATE:  03/08/2024, CONSULTATION DATE:  2/29/2025 REFERRING MD:  EDP, CHIEF COMPLAINT:  Severe Headache    History of Present Illness:  This is a 49 year man with a history of meningioma history of meningeal encephalitis, venous sinus thrombosis on Eliquis  and deafness who presented to the ED on 4/28 as a code stroke and found to have a new onset of right sided weakness.  This patient has had previous CVA health issues in the past with multiple thrombectomies. He had his third thrombectomy with Dr  Luellen Sages, MD on 4/29.  He is been admitted to the ICU for high risk intubation .  Pertinent  Medical History   Past Medical History:  Diagnosis Date   Deaf      Significant Hospital Events: Including procedures, antibiotic start and stop dates in addition to other pertinent events   4/28 Admitted 4/28 Code stroke called on him 4/29 Radiology with anesthesia - IR thrombectomy veno mesh-  Unsuccessful    Interim History / Subjective:  Patient is laying in bed with family at beside. Non verbal  with Powell [facility] nous EEG leads in place.  Objective   Blood pressure 125/77, pulse (!) 47, temperature 97.8 F (36.6 C), temperature source Oral, resp. rate 17, height 5\' 4"  (1.626 m), weight 65 kg, SpO2 100%.        Intake/Output Summary (Last 24 hours) at 03/10/2024 0653 Last data filed at 03/10/2024 0600 Gross per 24 hour  Intake 2658.92 ml  Output 4270 ml  Net -1611.08 ml   Filed Weights   03/08/24 1400  Weight: 65 kg    Examination: General: Laying in bed, EEG leads in place  HENT: No thyroid masses, atraumatic head, normocephalic  Lungs: Clear lung bilaterally  Cardiovascular: RRR Abdomen: soft, non tender  Extremities: warm and dry  Neuro: Moving all extremities , non verbal  GU: NPO   Mg 1.8 Na 148 Potassium 3.2 L Resolved Hospital Problem list   None   Assessment & Plan:  #Acute  Encephalopathy  #Right sided weakness  - Multiple hospitalization with this - S/p attempt thrombectomy 4/29 - Neurology is following  - Continue EEG - Continue to monitor for air way compromise  - Keep Na 150-155 - Continue hypertonic solution   #Hypokalemia  - 3.2  - Replete   #Deaf and nonverbal  - Seem chronic . Kindly use brother if he is bedside to communicate with the patient   #DVTppx - Heparin  drip   Best Practice (right click and "Reselect all SmartList Selections" daily)   Diet/type: NPO DVT prophylaxis: not indicated GI prophylaxis: PPI Lines: Central line Foley:  N/A Code Status:  full code Last date of multidisciplinary goals of care discussion [N/A]  Labs   CBC: Recent Labs  Lab 03/08/24 1403 03/08/24 1829 03/09/24 0543 03/10/24 0452  WBC  --  12.6* 10.3 9.0  NEUTROABS  --  10.1*  --   --   HGB 13.6 14.6 13.8 11.7*  HCT 40.0 44.4 41.7 35.8*  MCV  --  81.3 80.5 81.7  PLT  --  230 245 229    Basic Metabolic Panel: Recent Labs  Lab 03/03/24 1057 03/04/24 0612 03/08/24 1403 03/08/24 1829 03/09/24 1330 03/09/24 2235 03/10/24 0116 03/10/24 0452  NA 145 141 144 141 136 146* 145 148*  K 3.6 3.5 3.6 3.8  --   --   --  3.2*  CL  125* 111 115* 109  --   --   --  122*  CO2 14* 18*  --  18*  --   --   --  17*  GLUCOSE 95 103* 111* 120*  --   --   --  118*  BUN <5* <5* 7 8  --   --   --  8  CREATININE 0.66 1.10 0.80 1.09  --   --   --  0.98  CALCIUM  6.7* 9.2  --  9.3  --   --   --  8.8*  MG 1.4* 2.3  --   --   --   --   --  1.8   GFR: Estimated Creatinine Clearance: 88.9 mL/min (by C-G formula based on SCr of 0.98 mg/dL). Recent Labs  Lab 03/08/24 1829 03/08/24 2117 03/09/24 0543 03/10/24 0452  PROCALCITON  --  <0.10  --   --   WBC 12.6*  --  10.3 9.0    Liver Function Tests: Recent Labs  Lab 03/08/24 1829 03/10/24 0452  AST 25 25  ALT 42 41  ALKPHOS 66 52  BILITOT 0.8 0.5  PROT 7.3 6.1*  ALBUMIN 4.2 3.4*   No results for  input(s): "LIPASE", "AMYLASE" in the last 168 hours. Recent Labs  Lab 03/09/24 0947  AMMONIA 26    ABG    Component Value Date/Time   PHART 7.44 03/09/2024 1250   PCO2ART 26 (L) 03/09/2024 1250   PO2ART 100 03/09/2024 1250   HCO3 17.7 (L) 03/09/2024 1250   TCO2 17 (L) 03/08/2024 1403   ACIDBASEDEF 4.8 (H) 03/09/2024 1250   O2SAT 99.5 03/09/2024 1250     Coagulation Profile: Recent Labs  Lab 03/08/24 1829  INR 1.4*    Cardiac Enzymes: Recent Labs  Lab 03/08/24 1829  CKTOTAL 262    HbA1C: Hgb A1c MFr Bld  Date/Time Value Ref Range Status  01/26/2024 08:47 PM 5.7 (H) 4.8 - 5.6 % Final    Comment:    (NOTE) Pre diabetes:          5.7%-6.4%  Diabetes:              >6.4%  Glycemic control for   <7.0% adults with diabetes     CBG: Recent Labs  Lab 03/09/24 1155 03/09/24 1355 03/09/24 1536 03/09/24 2338 03/10/24 0313  GLUCAP 160* 164* 167* 158* 124*    Review of Systems:   Unable to access   Past Medical History:  He,  has a past medical history of Deaf.   Surgical History:   Past Surgical History:  Procedure Laterality Date   IR ANGIO INTRA EXTRACRAN SEL COM CAROTID INNOMINATE BILAT MOD SED  02/02/2024   IR THROMBECT VENO MECH MOD SED  02/02/2024   IR VENO/JUGULAR LEFT  02/02/2024   RADIOLOGY WITH ANESTHESIA N/A 02/02/2024   Procedure: RADIOLOGY WITH ANESTHESIA;  Surgeon: Luellen Sages, MD;  Location: [facility] OR;  Service: Radiology;  Laterality: N/A;     Social History:   reports that he has never smoked. He has never used smokeless tobacco. He reports that he does not currently use alcohol. He reports that he does not currently use drugs.   Family History:  His family history is not on file.   Allergies No Known Allergies   Home Medications  Prior to Admission medications   Medication Sig Start Date End Date Taking? Authorizing Provider  apixaban  (ELIQUIS ) 5 MG TABS tablet Take 1 tablet (5 mg total) by mouth  2 (two) times daily. 02/11/24  03/12/24 Yes Pahwani, Martha Slack, MD  atorvastatin  (LIPITOR ) 80 MG tablet Take 1 tablet (80 mg total) by mouth daily. 02/10/24 03/11/24 Yes Modena Andes, MD  butalbital -acetaminophen -caffeine  (FIORICET ) 50-325-40 MG tablet Take 1 tablet by mouth every 6 (six) hours as needed (refractory HA.). 03/05/24  Yes Leona Rake, MD  ondansetron  (ZOFRAN ) 4 MG tablet Take 1 tablet (4 mg total) by mouth every 6 (six) hours as needed for nausea. 01/29/24  Yes Audria Leather, MD  pantoprazole  (PROTONIX ) 40 MG tablet Take 1 tablet (40 mg total) by mouth daily. Patient taking differently: Take 40 mg by mouth daily before breakfast. 03/05/24  Yes Adhikari, Corene Devonshire, MD  topiramate  (TOPAMAX ) 25 MG tablet Take 1 tablet (25 mg total) by mouth daily. 03/05/24  Yes Leona Rake, MD     Critical care time: 44      Fay Hoop MD [facility] Internal Medicine Program - PGY-1 03/10/2024, 6:53 AM Pager# [phone]

## 2024-03-10 NOTE — Progress Notes (Signed)
 PHARMACY - ANTICOAGULATION CONSULT NOTE  Pharmacy Consult for heparin  Indication:  Venous sinus thrombosis   No Known Allergies  Patient Measurements: Height: 5\' 4"  (162.6 cm) Weight: 65 kg (143 lb 4.8 oz) IBW/kg (Calculated) : 59.2 HEPARIN  DW (KG): 65  Vital Signs: Temp: 98.1 F (36.7 C) (04/30 0723) Temp Source: Oral (04/30 0723) BP: 121/76 (04/30 0800) Pulse Rate: 51 (04/30 0800)  Labs: Recent Labs    03/08/24 1403 03/08/24 1403 03/08/24 1829 03/08/24 2117 03/09/24 0543 03/09/24 0947 03/10/24 0452  HGB 13.6  --  14.6  --  13.8  --  11.7*  HCT 40.0  --  44.4  --  41.7  --  35.8*  PLT  --   --  230  --  245  --  229  APTT  --    < > 47* 56* 77* 74* 91*  LABPROT  --   --  17.1*  --   --   --   --   INR  --   --  1.4*  --   --   --   --   HEPARINUNFRC  --   --   --  >1.10* >1.10*  --  >1.10*  CREATININE 0.80  --  1.09  --   --   --  0.98  CKTOTAL  --   --  262  --   --   --   --    < > = values in this interval not displayed.    Estimated Creatinine Clearance: 88.9 mL/min (by C-G formula based on SCr of 0.98 mg/dL).   Medical History: Past Medical History:  Diagnosis Date   Deaf      Assessment: 54 yoM recently admitted with venous sinus thrombosis. Discharged home on Eliquis  (unknown last dose (pt is deaf and family is non-english speaking)), presenting to the ED with worsening right sided weakness. CT shows extension of the previous thrombus. Neurology has asked to start heparin  infusion without bolus.   aPTT 91s is supratherapeutic with heparin  running at 900 units/hr. Hgb (11.7)  has dropped slight, likely dilutional. PLTs (229) are stable. Heparin  level remains > 1.1 in the setting of recent Eliquis  use. Per RN, no report of pauses, issues with the line, or signs of bleeding.    Goal of Therapy:  Heparin  level 0.3-0.5 units/ml aPTT: 66-85 seconds Monitor platelets by anticoagulation protocol: Yes  Plan: Decrease IV heparin  to 750 units/hr Check 6  hour aPTT Monitor with aPTT until correlates with heparin  level Continue to monitor H&H and platelets   Keneth Pay, PharmD PGY1 Pharmacy Resident  Please check AMION for all [facility] phone numbers After 10:00 PM, call Main Pharmacy [phone] 03/10/2024 9:25 AM

## 2024-03-10 NOTE — Evaluation (Signed)
 Occupational Therapy Evaluation Patient Details Name: Samuel Warren MRN: [id number] DOB: September 18, 1991 Today's Date: 03/10/2024   History of Present Illness   Pt is a 32 y/o M presenting to ED on 4/29 after being found on the ground with R weakness, CT head with unchanged small subacute L frontal lobe infarct, CTA with worsening venous thrombus. RR called 4/29 and transferred to ICU for worsening encephalopathy. Recent admission for similar, found to be orthostatic and dehydrated 4/25. S/p bil common carotid angiograms and internal jugular venograms on 4/29, unsuccessful thrombectomy. PMH includes deafness, stroke/extensive venous thrombus on Eliquis , meningoencephalitis, HLD, GERD     Clinical Impressions Per chart review/family pt using RW for mobility, supervision for ADLs and was working prior to last hospitalization. Pt lives with family. Pt currently with R sided deficits, needs up to max A for ADLs, and mod-max +2 for bed mobility. Pt able to sit EOB x5 min for BUE/BLE ROM, noted to track to L/R to attend to therapists but noted R inattention as pt laying on RUE with bed mobility. Pt presenting with impairments listed below, will follow acutely. Patient will benefit from intensive inpatient follow-up therapy, >3 hours/day to maximize safety/ind with ADL/functional mobility.      If plan is discharge home, recommend the following:   Two people to help with walking and/or transfers;A lot of help with bathing/dressing/bathroom;Assistance with cooking/housework;Direct supervision/assist for financial management;Direct supervision/assist for medications management;Assist for transportation;Help with stairs or ramp for entrance     Functional Status Assessment   Patient has had a recent decline in their functional status and demonstrates the ability to make significant improvements in function in a reasonable and predictable amount of time.     Equipment Recommendations   Other (comment)  (defer)     Recommendations for Other Services   PT consult;Rehab consult     Precautions/Restrictions   Precautions Precautions: Fall Recall of Precautions/Restrictions: Intact Restrictions Weight Bearing Restrictions Per Provider Order: No     Mobility Bed Mobility Overal bed mobility: Needs Assistance Bed Mobility: Sidelying to Sit, Sit to Supine   Sidelying to sit: Mod assist, +2 for physical assistance   Sit to supine: Max assist, +2 for physical assistance        Transfers                   General transfer comment: deferred 2/2 headache and fatigue      Balance Overall balance assessment: Needs assistance Sitting-balance support: No upper extremity supported, Feet unsupported Sitting balance-Leahy Scale: Poor Sitting balance - Comments: posterior and L lateral lean                                   ADL either performed or assessed with clinical judgement   ADL Overall ADL's : Needs assistance/impaired Eating/Feeding: NPO   Grooming: Minimal assistance;Sitting   Upper Body Bathing: Moderate assistance;Sitting   Lower Body Bathing: Sitting/lateral leans;Maximal assistance   Upper Body Dressing : Moderate assistance;Sitting   Lower Body Dressing: Maximal assistance;Sitting/lateral leans   Toilet Transfer: Moderate assistance;Maximal assistance;+2 for physical assistance   Toileting- Clothing Manipulation and Hygiene: Maximal assistance       Functional mobility during ADLs: Moderate assistance;Maximal assistance;+2 for physical assistance       Vision   Additional Comments: does track R/L to look at therapist and family but noted to have R inattention, questionable neglect? will further assess  Perception Perception: Not tested       Praxis Praxis: Not tested       Pertinent Vitals/Pain Pain Assessment Pain Assessment: Faces Pain Score: 5  Faces Pain Scale: Hurts even more Pain Location: posterior  headache Pain Descriptors / Indicators: Grimacing     Extremity/Trunk Assessment Upper Extremity Assessment Upper Extremity Assessment: RUE deficits/detail RUE Deficits / Details: 3/5 lifts against gravity RUE Sensation: decreased light touch;decreased proprioception RUE Coordination: decreased fine motor;decreased gross motor   Lower Extremity Assessment Lower Extremity Assessment: Defer to PT evaluation   Cervical / Trunk Assessment Cervical / Trunk Assessment: Normal   Communication Communication Communication: Impaired Factors Affecting Communication: Hearing impaired;Non - English speaking, interpreter not available   Cognition Arousal: Alert Behavior During Therapy: WFL for tasks assessed/performed   Difficult to assess due to: Non-English speaking, Hard of hearing/deaf           OT - Cognition Comments: sign language interpreter gesturing to pt, pt following commands to perform BUE/BLE ROM                 Following commands: Intact Following commands impaired: Only follows one step commands consistently     Cueing  General Comments   Cueing Techniques: Gestural cues;Verbal cues  VSS on RA   Exercises     Shoulder Instructions      Home Living Family/patient expects to be discharged to:: Private residence Living Arrangements: Parent;Other relatives Available Help at Discharge: Family;Available 24 hours/day Type of Home: House Home Access: Stairs to enter [employer] of Steps: 7 Entrance Stairs-Rails: Left;Right Home Layout: One level     Bathroom Shower/Tub: Chief Strategy Officer: Standard     Home Equipment: None          Prior Functioning/Environment Prior Level of Function : Needs assist             Mobility Comments: RW ADLs Comments: working with OP OT PTA, supervision for ADLs per prior therapy note, working at [employer] Problem List: Impaired balance (sitting and/or  standing);Decreased safety awareness;Impaired sensation;Decreased strength;Decreased activity tolerance;Decreased range of motion;Decreased cognition;Decreased coordination;Impaired vision/perception   OT Treatment/Interventions: Self-care/ADL training;Therapeutic exercise;Neuromuscular education;DME and/or AE instruction;Therapeutic activities;Patient/family education;Balance training      OT Goals(Current goals can be found in the care plan section)   Acute Rehab OT Goals Patient Stated Goal: did not state OT Goal Formulation: With patient Time For Goal Achievement: 03/24/24 Potential to Achieve Goals: Good   OT Frequency:  Min 2X/week    Co-evaluation PT/OT/SLP Co-Evaluation/Treatment: Yes Reason for Co-Treatment: For patient/therapist safety (scheduled interpreter)   OT goals addressed during session: ADL's and self-care      AM-PAC OT "6 Clicks" Daily Activity     Outcome Measure Help from another person eating meals?: A Lot Help from another person taking care of personal grooming?: A Lot Help from another person toileting, which includes using toliet, bedpan, or urinal?: A Lot Help from another person bathing (including washing, rinsing, drying)?: A Lot Help from another person to put on and taking off regular upper body clothing?: A Lot Help from another person to put on and taking off regular lower body clothing?: A Lot 6 Click Score: 12   End of Session Equipment Utilized During Treatment: Gait belt Nurse Communication: Mobility status  Activity Tolerance: Patient limited by lethargy;Patient limited by fatigue Patient left: in bed;with call bell/phone within reach;with bed alarm set;with family/visitor present  OT Visit Diagnosis:  Unsteadiness on feet (R26.81);Repeated falls (R29.6);Low vision, both eyes (H54.2);Muscle weakness (generalized) (M62.81);Other symptoms and signs involving the nervous system (R29.898);Other symptoms and signs involving cognitive function                 Time: 1315-1335 OT Time Calculation (min): 20 min Charges:  OT General Charges $OT Visit: 1 Visit OT Evaluation $OT Eval Moderate Complexity: 1 Mod  Keairra Bardon K, OTD, OTR/L SecureChat Preferred Acute Rehab [phone]   Ilan Kahrs K Koonce 03/10/2024, 2:10 PM

## 2024-03-10 NOTE — Procedures (Addendum)
 Patient Name: Samuel Warren  MRN: [id number]  Epilepsy Attending: Arleene Lack  Referring Physician/Provider: Consuelo Denmark, MD  Duration: 03/09/2024 2212 to 03/10/2024 2212   Patient history: 33yo M with new onset R sided weakness. EEG to evaluate for seizure   Level of alertness: Awake, asleep   AEDs during EEG study: TPM   Technical aspects: This EEG study was done with scalp electrodes positioned according to the 10-20 International system of electrode placement. Electrical activity was reviewed with band pass filter of 1-70Hz , sensitivity of 7 uV/mm, display speed of 33mm/sec with a 60Hz  notched filter applied as appropriate. EEG data were recorded continuously and digitally stored.  Video monitoring was available and reviewed as appropriate.   Description: The posterior dominant rhythm consists of 8 Hz activity of moderate voltage (25-35 uV) seen predominantly in posterior head regions, symmetric and reactive to eye opening and eye closing. EEG showed continuous generalized and lateralized left hemisphere 3 to 6 Hz theta-delta slowing. Sleep was characterized by vertex waves, sleep spindles (12-14hz ), maximal fronto-central region. Hyperventilation and photic stimulation were not performed.      ABNORMALITY - Continuous slow, generalized  and lateralized left hemisphere   IMPRESSION: This study is suggestive of cortical dysfunction in left hemisphere likely secondary to underlying structural abnormality. Additionally there is moderate diffuse encephalopathy. No seizures or epileptiform discharges were seen throughout the recording.   [name] O Faolan [place]

## 2024-03-10 NOTE — Progress Notes (Signed)
 eLink Physician-Brief Progress Note Patient Name: Samuel Warren DOB: 24-Jun-1991 MRN: [id number]   Date of Service  03/10/2024  HPI/Events of Note  RN quiery, on 3% saline infusion at 75 ml/hr.  On oral sodium chloride  three times a day.  Neurology : goal sodium 150-155, hold if greater than 160   eICU Interventions  Ok to give oral sodium tablet. Follow sodium levels. Not reached target yet per last lab < 150.      Intervention Category Intermediate Interventions: Other:  Samuel Warren 03/10/2024, 8:26 PM

## 2024-03-10 NOTE — Plan of Care (Signed)
  Problem: Pain Managment: Goal: General experience of comfort will improve and/or be controlled Outcome: Progressing   Problem: Safety: Goal: Ability to remain free from injury will improve Outcome: Progressing   Problem: Skin Integrity: Goal: Risk for impaired skin integrity will decrease Outcome: Progressing   Problem: Activity: Goal: Ability to return to baseline activity level will improve Outcome: Progressing

## 2024-03-11 ENCOUNTER — Inpatient Hospital Stay (HOSPITAL_COMMUNITY)

## 2024-03-11 DIAGNOSIS — I676 Nonpyogenic thrombosis of intracranial venous system: Secondary | ICD-10-CM | POA: Diagnosis not present

## 2024-03-11 DIAGNOSIS — G08 Intracranial and intraspinal phlebitis and thrombophlebitis: Secondary | ICD-10-CM | POA: Diagnosis not present

## 2024-03-11 DIAGNOSIS — R1310 Dysphagia, unspecified: Secondary | ICD-10-CM | POA: Diagnosis not present

## 2024-03-11 DIAGNOSIS — G936 Cerebral edema: Secondary | ICD-10-CM | POA: Diagnosis not present

## 2024-03-11 DIAGNOSIS — E785 Hyperlipidemia, unspecified: Secondary | ICD-10-CM | POA: Diagnosis not present

## 2024-03-11 DIAGNOSIS — R569 Unspecified convulsions: Secondary | ICD-10-CM | POA: Diagnosis not present

## 2024-03-11 LAB — CBC
HCT: 36.2 % — ABNORMAL LOW (ref 39.0–52.0)
Hemoglobin: 11.9 g/dL — ABNORMAL LOW (ref 13.0–17.0)
MCH: 27 pg (ref 26.0–34.0)
MCHC: 32.9 g/dL (ref 30.0–36.0)
MCV: 82.1 fL (ref 80.0–100.0)
Platelets: 208 10*3/uL (ref 150–400)
RBC: 4.41 MIL/uL (ref 4.22–5.81)
RDW: 12.8 % (ref 11.5–15.5)
WBC: 7.5 10*3/uL (ref 4.0–10.5)
nRBC: 0 % (ref 0.0–0.2)

## 2024-03-11 LAB — BASIC METABOLIC PANEL WITH GFR
Anion gap: 8 (ref 5–15)
BUN: 5 mg/dL — ABNORMAL LOW (ref 6–20)
CO2: 18 mmol/L — ABNORMAL LOW (ref 22–32)
Calcium: 8.5 mg/dL — ABNORMAL LOW (ref 8.9–10.3)
Chloride: 120 mmol/L — ABNORMAL HIGH (ref 98–111)
Creatinine, Ser: 0.82 mg/dL (ref 0.61–1.24)
GFR, Estimated: >60 mL/min (ref >60)
Glucose, Bld: 111 mg/dL — ABNORMAL HIGH (ref 70–99)
Potassium: 2.9 mmol/L — ABNORMAL LOW (ref 3.5–5.1)
Sodium: 146 mmol/L — ABNORMAL HIGH (ref 135–145)

## 2024-03-11 LAB — GLUCOSE, CAPILLARY
Glucose-Capillary: 107 mg/dL — ABNORMAL HIGH (ref 70–99)
Glucose-Capillary: 108 mg/dL — ABNORMAL HIGH (ref 70–99)
Glucose-Capillary: 110 mg/dL — ABNORMAL HIGH (ref 70–99)
Glucose-Capillary: 110 mg/dL — ABNORMAL HIGH (ref 70–99)
Glucose-Capillary: 114 mg/dL — ABNORMAL HIGH (ref 70–99)
Glucose-Capillary: 121 mg/dL — ABNORMAL HIGH (ref 70–99)

## 2024-03-11 LAB — SODIUM
Sodium: 147 mmol/L — ABNORMAL HIGH (ref 135–145)
Sodium: 146 mmol/L — ABNORMAL HIGH (ref 135–145)
Sodium: 144 mmol/L (ref 135–145)

## 2024-03-11 LAB — MAGNESIUM: Magnesium: 1.9 mg/dL (ref 1.7–2.4)

## 2024-03-11 LAB — HEPARIN LEVEL (UNFRACTIONATED): Heparin Unfractionated: 0.72 [IU]/mL — ABNORMAL HIGH (ref 0.30–0.70)

## 2024-03-11 LAB — APTT: aPTT: 64 s — ABNORMAL HIGH (ref 24–36)

## 2024-03-11 MED ORDER — POTASSIUM CHLORIDE CRYS ER 20 MEQ PO TBCR
40.0000 meq | EXTENDED_RELEASE_TABLET | ORAL | Status: AC
  Administered 2024-03-11 (×3): 40 meq via ORAL
  Filled 2024-03-11 (×3): qty 2

## 2024-03-11 MED ORDER — APIXABAN 5 MG PO TABS
5.0000 mg | ORAL_TABLET | Freq: Two times a day (BID) | ORAL | Status: DC
  Administered 2024-03-11 – 2024-03-12 (×4): 5 mg via ORAL
  Filled 2024-03-11 (×5): qty 1

## 2024-03-11 MED ORDER — SENNA 8.6 MG PO TABS
1.0000 | ORAL_TABLET | Freq: Every day | ORAL | Status: DC
  Administered 2024-03-11 – 2024-03-13 (×3): 8.6 mg via ORAL
  Filled 2024-03-11 (×3): qty 1

## 2024-03-11 MED ORDER — MAGNESIUM SULFATE 2 GM/50ML IV SOLN
2.0000 g | Freq: Once | INTRAVENOUS | Status: AC
  Administered 2024-03-11: 2 g via INTRAVENOUS
  Filled 2024-03-11: qty 50

## 2024-03-11 MED ORDER — POLYETHYLENE GLYCOL 3350 17 G PO PACK
17.0000 g | PACK | Freq: Every day | ORAL | Status: DC
  Administered 2024-03-11 – 2024-03-14 (×3): 17 g via ORAL
  Filled 2024-03-11 (×4): qty 1

## 2024-03-11 MED ORDER — POTASSIUM CHLORIDE 20 MEQ PO PACK
40.0000 meq | PACK | ORAL | Status: DC
  Filled 2024-03-11: qty 2

## 2024-03-11 NOTE — Progress Notes (Signed)
 STROKE TEAM PROGRESS NOTE   INTERIM HISTORY/SUBJECTIVE Dr. Waylan Warren and CCM team are at the bedside. Pt continues to do well, awake alert tracking bilaterally and moving all extremities, seems symmetrical. Na 146 today. Will tape down 3% saline. D/c topamax  due to low bicarb. No seizure on LTM, will d/c. Switch heparin  IV back to eliquis .   OBJECTIVE  CBC    Component Value Date/Time   WBC 7.5 03/11/2024 0624   RBC 4.41 03/11/2024 0624   HGB 11.9 (L) 03/11/2024 0624   HCT 36.2 (L) 03/11/2024 0624   PLT 208 03/11/2024 0624   MCV 82.1 03/11/2024 0624   MCH 27.0 03/11/2024 0624   MCHC 32.9 03/11/2024 0624   RDW 12.8 03/11/2024 0624   LYMPHSABS 1.5 03/10/2024 1032   MONOABS 1.0 03/10/2024 1032   EOSABS 0.0 03/10/2024 1032   BASOSABS 0.0 03/10/2024 1032    BMET    Component Value Date/Time   NA 146 (H) 03/11/2024 0621   K 2.9 (L) 03/11/2024 0621   CL 120 (H) 03/11/2024 0621   CO2 18 (L) 03/11/2024 0621   GLUCOSE 111 (H) 03/11/2024 0621   BUN 5 (L) 03/11/2024 0621   CREATININE 0.82 03/11/2024 0621   CALCIUM  8.5 (L) 03/11/2024 0621   GFRNONAA >60 03/11/2024 7846    IMAGING past 24 hours No results found.   Vitals:   03/11/24 0600 03/11/24 0700 03/11/24 0754 03/11/24 0800  BP: 123/80 119/85  131/87  Pulse: (!) 39 (!) 39  (!) 53  Resp: 10 13  16   Temp:   97.7 F (36.5 C)   TempSrc:   Oral   SpO2: 99% 99%  100%  Weight:      Height:         PHYSICAL EXAM General:  Alert, well-nourished, well-developed patient in no acute distress Psych:  Mood and affect appropriate for situation CV: Regular rate and rhythm on monitor Respiratory:  Regular, unlabored respirations on room air GI: Abdomen soft and nontender NEURO:  Pt is awake, alert, eyes open, smile to provider. No gaze palsy, tracking bilaterally, blinking to visual threat bilaterally, PERRL. No facial droop. Tongue midline. Bilateral UEs 5/5, no drift. Bilaterally LEs 5/5, no drift. b/l FTN intact, gait not  tested.     ASSESSMENT/PLAN  [name] is a 33 y.o. 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 CT head with no acute process. 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 CTA and CTV obtained. Heparin  gtt initiated. NIH on Admission 25  Worsening mental status, improved Patient seems more drowsy sleepy, hard to arouse on 4/29 CT repeat 4/29 showed increased cerebral edema Transferred to ICU for 3% saline with bolus S/p IR but not successful with IR Dr. Alvira Warren Mental status much improved, back to basel ine now Tape down 3% saline  On LTM EEG - no seizure -> d/c  Persistent dural venous sinus thrombus:   Code Stroke CT head -  Unchanged small subacute left frontal lobe infarct in the setting of known dural venous sinus thrombosis. ASPECTS of 10. CTA head & neck No LVO CT Venogram Mild progression of extensive thrombus in the superior sagittal sinus posteriorly. Mild improvement of subocclusive thrombus in the  transverse and sigmoid sinuses bilaterally. Persistent bilateral cavernous sinus thrombosis. MRI  Evolving subacute venous ischemic changes involving the paramedian frontal lobes and likely the left occipital  lobe, not significantly changed as compared to previous. Mild associated petechial hemorrhage at the posterior left frontal region, stable from prior.  4/29- Stat Head CT generalized brain swelling.  On review, seems patient has more cerebral edema than 4/28 3/18- 2D Echo - EF 45-50% in 01/2024 EEG- negative for seizure, mild to moderate diffuse encephalopathy LDL 58 HgbA1c 5.7 VTE prophylaxis - Heparin  IV Eliquis  (apixaban ) daily prior to admission, now on heparin  IV. Switch back to eliquis  today Therapy recommendations:  CIR Disposition:  pending  Cerebral edema Transferred to ICU  on 4/29 due to decreased LOC Hypertonic saline started at 81ml/hr->40cc goal sodium 150-155 Serial neurochecks Q6 Na Na 144 --141--136-146-148-149-146 Add Na tab 2g tid  Hx of CVST Admitted 02/01/2024 for CVST.  Status post IR for thrombectomy and discharged on Eliquis . Readmitted 03/02/2024 for right-sided weakness.  CT head showed increased left frontal venous infarct.  CTV showed recurrent occlusive and subocclusive thrombi within the SSS posteriorly, extending to the confluence of sinus.  Thrombus extending into the medial aspect of the right transverse sinus.  Clot burden within the right sigmoid sinus has decreased.  Patient was concerning for dehydration at the time, and received fluid resuscitation.  Discharged in good condition However, per wife, patient discharged on 4/25 Friday, at the night of that day, patient drowsy sleepy not talking.  Saturday some improvement but not eating and drinking well able to take medication.  Sunday she did a poorly, drowsy sleepy not talking not eating, however wife was able to get medication down.  Monday yesterday, he was sent to ER for evaluation.  Hx of Stroke Recent viral meningitis January 27, 2024 right frontal 2-3 punctate infarcts, etiology: Likely due to infectious vasculitis vs. Small vessel disease in the setting of CNS infection  CSF at that time WBC 22, RBC 1330, protein 91, glucose 80, concerning for viral meningitis Completed course of Doxy No need LP repeat at this time given no sign of CNS infection and especially with cerebral edema and low-lying cerebellar tonsils extending up to 9 mm below the foreman magnum  Hyperlipidemia Home meds:  Atorvastatin  80 LDL in 01/2024 279 This admission LDL 58, goal < 70 Continue statin  Dysphagia SLP consulted On dys 3 and thin liquid Advance diet as tolerated  Other Stroke Risk Factors   Other Active Problems Hypokalemia K 3.2 - 2.9 - supplement  Hospital day # 3   Samuel Denmark, MD  PhD Stroke Neurology 03/11/2024 11:18 AM  This patient is critically ill due to persistent CVST, cerebral edema, needing thrombectomy and at significant risk of neurological worsening, death form brain herniation, worsening neuro status. This patient's care requires constant monitoring of vital signs, hemodynamics, respiratory and cardiac monitoring, review of multiple databases, neurological assessment, discussion with family, other specialists and medical decision making of high complexity. I spent 35 minutes of neurocritical care time in the care of this patient. I discussed with CCM Dr. Waylan Warren     To contact Stroke Continuity provider, please refer to [email or web address]. After hours, contact General Neurology

## 2024-03-11 NOTE — Progress Notes (Signed)
 vLTM maintenance  All impedances below 10k  No skin breakdown noted at   T3 T5  CZ  A1

## 2024-03-11 NOTE — Progress Notes (Signed)
 PHARMACY - ANTICOAGULATION  Pharmacy Consult for heparin  > Eliquis  Indication:  Venous sinus thrombosis  Brief A/P: Mental status much improved and no further plans for procedures. Will transition from heparin  to Eliquis .   No Known Allergies  Patient Measurements: Height: 5\' 4"  (162.6 cm) Weight: 65 kg (143 lb 4.8 oz) IBW/kg (Calculated) : 59.2 HEPARIN  DW (KG): 65  Vital Signs: Temp: 97.7 F (36.5 C) (05/01 0754) Temp Source: Oral (05/01 0754) BP: 131/87 (05/01 0800) Pulse Rate: 53 (05/01 0800)  Labs: Recent Labs    03/08/24 1829 03/08/24 2117 03/09/24 0543 03/09/24 0947 03/10/24 0452 03/10/24 1032 03/10/24 1745 03/11/24 0042 03/11/24 0621 03/11/24 0624  HGB 14.6  --  13.8  --  11.7* 12.3*  --   --   --  11.9*  HCT 44.4  --  41.7  --  35.8* 37.5*  --   --   --  36.2*  PLT 230  --  245  --  229 224  --   --   --  208  APTT 47*   < > 77*   < > 91*  --  57* 64*  --   --   LABPROT 17.1*  --   --   --   --   --   --   --   --   --   INR 1.4*  --   --   --   --   --   --   --   --   --   HEPARINUNFRC  --    < > >1.10*  --  >1.10*  --   --  0.72*  --   --   CREATININE 1.09  --   --   --  0.98  --   --   --  0.82  --   CKTOTAL 262  --   --   --   --   --   --   --   --   --    < > = values in this interval not displayed.    Estimated Creatinine Clearance: 106.3 mL/min (by C-G formula based on SCr of 0.82 mg/dL).   Assessment:  33 y.o. male with recurrent venous sinus thrombosis s/p attempted intervention in IR, Eliquis  on hold, for heparin .  Goal of Therapy:  Monitor platelets by anticoagulation protocol: Yes  Plan: Stop heparin  infusion Resume Eliquis  5 mg PO BID  Monitor CBC and s/sx of bleeding  Juleen Oakland, PharmD PGY1 Pharmacy Resident 03/11/2024 10:26 AM

## 2024-03-11 NOTE — Anesthesia Postprocedure Evaluation (Signed)
 Anesthesia Post Note  Patient: Samuel Warren  Procedure(s) Performed: RADIOLOGY WITH ANESTHESIA     Patient location during evaluation: PACU Anesthesia Type: General Level of consciousness: awake and alert Pain management: pain level controlled Vital Signs Assessment: post-procedure vital signs reviewed and stable Respiratory status: spontaneous breathing, nonlabored ventilation, respiratory function stable and patient connected to nasal cannula oxygen Cardiovascular status: blood pressure returned to baseline and stable Postop Assessment: no apparent nausea or vomiting Anesthetic complications: no   There were no known notable events for this encounter.  Last Vitals:  Vitals:   03/11/24 0754 03/11/24 0800  BP:  131/87  Pulse:  (!) 53  Resp:  16  Temp: 36.5 C   SpO2:  100%    Last Pain:  Vitals:   03/11/24 0800  TempSrc:   PainSc: 0-No pain                 Neeraj Housand S

## 2024-03-11 NOTE — TOC Progression Note (Signed)
 Transition of Care [facility]) - Progression Note    Patient Details  Name: Samuel Warren MRN: [id number] Date of Birth: 12-08-1990  [facility] of [facility]) CM/SW Contact  Juliane Och, LCSW Phone Number: 03/11/2024, 9:53 AM  Clinical Narrative:     9:54 AM Per chart review, patient is not at a level to tolerate CIR but CIR admissions will continue to follow for progress and will place Rehab Consult if appropriate.   Expected Discharge Plan: Home/Self Care Barriers to Discharge: Continued Medical Work up  Expected Discharge Plan and Services       Living arrangements for the past 2 months: Single Family Home                                       Social Determinants of Health (SDOH) Interventions SDOH Screenings   Food Insecurity: No Food Insecurity (03/08/2024)  Housing: Low Risk  (03/09/2024)  Transportation Needs: No Transportation Needs (03/08/2024)  Utilities: Not At Risk (03/08/2024)  Social Connections: Patient Declined (02/08/2024)  Tobacco Use: Low Risk  (03/09/2024)    Readmission Risk Interventions    01/29/2024   11:53 AM  Readmission Risk Prevention Plan  Post Dischage Appt Complete  Medication Screening Complete  Transportation Screening Complete

## 2024-03-11 NOTE — Progress Notes (Signed)
 NAME:  Samuel Warren, MRN:  [id number], DOB:  [birth year], LOS: 3 ADMISSION DATE:  03/08/2024, CONSULTATION DATE:  2/29/2025 REFERRING MD:  EDP, CHIEF COMPLAINT:  Severe Headache    History of Present Illness:  This is a 37 year man with a history of meningioma history of meningeal encephalitis, venous sinus thrombosis on Eliquis  and deafness who presented to the ED on 4/28 as a code stroke and found to have a new onset of right sided weakness.  This patient has had previous CVA health issues in the past with multiple thrombectomies. He had his third thrombectomy with Dr  Luellen Sages, MD on 4/29.  He is been admitted to the ICU for high risk intubation .  Pertinent  Medical History   Past Medical History:  Diagnosis Date   Deaf      Significant Hospital Events: Including procedures, antibiotic start and stop dates in addition to other pertinent events   4/28 Admitted 4/28 Code stroke called on him 4/29 Radiology with anesthesia - IR thrombectomy veno mesh-  Unsuccessful   4/30 Continuous EEG   Interim History / Subjective:  Sleeping comfortably in bed. Remains non verbal   Objective   Blood pressure 119/85, pulse (!) 39, temperature 97.7 F (36.5 C), temperature source Oral, resp. rate 13, height 5\' 4"  (1.626 m), weight 65 kg, SpO2 99%.        Intake/Output Summary (Last 24 hours) at 03/11/2024 0800 Last data filed at 03/11/2024 0700 Gross per 24 hour  Intake 2030.88 ml  Output 2550 ml  Net -519.12 ml   Filed Weights   03/08/24 1400  Weight: 65 kg    Examination: General: Laying in bed, EEG leads remain in place  HENT: No thyroid masses, atraumatic head, normocephalic  Lungs: Clear lung bilaterally  Cardiovascular: RRR Abdomen: soft, non tender  Extremities: warm and dry  Neuro: Moving all extremities , non verbal  GU: NPO   Mg 1.8 Na 146 Potassium 2.9 Resolved Hospital Problem list   None   Assessment & Plan:  #Acute Encephalopathy  #Right sided  weakness  - Multiple hospitalization with this - S/p attempt thrombectomy 4/29 - Neurology is following  - Continuing  EEG - Continue to monitor for air way compromise  - Keep Na 150-155 - Continue hypertonic solution   #Hypokalemia  - 3.2  - Replete   #Deaf and nonverbal  - Seem chronic . Kindly use brother if he is bedside to communicate with the patient   #DVTppx - Heparin  drip   Best Practice (right click and "Reselect all SmartList Selections" daily)   Diet/type: NPO DVT prophylaxis: not indicated GI prophylaxis: PPI Lines: Central line Foley:  N/A Code Status:  full code Last date of multidisciplinary goals of care discussion [N/A]  Labs   CBC: Recent Labs  Lab 03/08/24 1829 03/09/24 0543 03/10/24 0452 03/10/24 1032 03/11/24 0624  WBC 12.6* 10.3 9.0 9.3 7.5  NEUTROABS 10.1*  --   --  6.8  --   HGB 14.6 13.8 11.7* 12.3* 11.9*  HCT 44.4 41.7 35.8* 37.5* 36.2*  MCV 81.3 80.5 81.7 83.1 82.1  PLT 230 245 229 224 208    Basic Metabolic Panel: Recent Labs  Lab 03/08/24 1403 03/08/24 1829 03/09/24 1330 03/10/24 0452 03/10/24 1300 03/10/24 2021 03/11/24 0042 03/11/24 0621  NA 144 141   < > 148* 149* 146* 147* 146*  K 3.6 3.8  --  3.2*  --   --   --  2.9*  CL 115* 109  --  122*  --   --   --  120*  CO2  --  18*  --  17*  --   --   --  18*  GLUCOSE 111* 120*  --  118*  --   --   --  111*  BUN 7 8  --  8  --   --   --  5*  CREATININE 0.80 1.09  --  0.98  --   --   --  0.82  CALCIUM   --  9.3  --  8.8*  --   --   --  8.5*  MG  --   --   --  1.8  --   --   --   --    < > = values in this interval not displayed.   GFR: Estimated Creatinine Clearance: 106.3 mL/min (by C-G formula based on SCr of 0.82 mg/dL). Recent Labs  Lab 03/08/24 2117 03/09/24 0543 03/10/24 0452 03/10/24 1032 03/11/24 0624  PROCALCITON <0.10  --   --   --   --   WBC  --  10.3 9.0 9.3 7.5    Liver Function Tests: Recent Labs  Lab 03/08/24 1829 03/10/24 0452  AST 25 25   ALT 42 41  ALKPHOS 66 52  BILITOT 0.8 0.5  PROT 7.3 6.1*  ALBUMIN 4.2 3.4*   No results for input(s): "LIPASE", "AMYLASE" in the last 168 hours. Recent Labs  Lab 03/09/24 0947  AMMONIA 26    ABG    Component Value Date/Time   PHART 7.44 03/09/2024 1250   PCO2ART 26 (L) 03/09/2024 1250   PO2ART 100 03/09/2024 1250   HCO3 17.7 (L) 03/09/2024 1250   TCO2 17 (L) 03/08/2024 1403   ACIDBASEDEF 4.8 (H) 03/09/2024 1250   O2SAT 99.5 03/09/2024 1250     Coagulation Profile: Recent Labs  Lab 03/08/24 1829  INR 1.4*    Cardiac Enzymes: Recent Labs  Lab 03/08/24 1829  CKTOTAL 262    HbA1C: Hgb A1c MFr Bld  Date/Time Value Ref Range Status  01/26/2024 08:47 PM 5.7 (H) 4.8 - 5.6 % Final    Comment:    (NOTE) Pre diabetes:          5.7%-6.4%  Diabetes:              >6.4%  Glycemic control for   <7.0% adults with diabetes     CBG: Recent Labs  Lab 03/10/24 1615 03/10/24 1947 03/11/24 0005 03/11/24 0419 03/11/24 0753  GLUCAP 75 123* 121* 114* 108*    Review of Systems:   Unable to access   Past Medical History:  He,  has a past medical history of Deaf.   Surgical History:   Past Surgical History:  Procedure Laterality Date   IR ANGIO INTRA EXTRACRAN SEL COM CAROTID INNOMINATE BILAT MOD SED  02/02/2024   IR THROMBECT VENO MECH MOD SED  02/02/2024   IR VENO/JUGULAR LEFT  02/02/2024   RADIOLOGY WITH ANESTHESIA N/A 02/02/2024   Procedure: RADIOLOGY WITH ANESTHESIA;  Surgeon: Luellen Sages, MD;  Location: [facility] OR;  Service: Radiology;  Laterality: N/A;   RADIOLOGY WITH ANESTHESIA N/A 03/09/2024   Procedure: RADIOLOGY WITH ANESTHESIA;  Surgeon: Luellen Sages, MD;  Location: [facility] OR;  Service: Radiology;  Laterality: N/A;     Social History:   reports that he has never smoked. He has never used smokeless tobacco. He reports that he does not currently use alcohol.  He reports that he does not currently use drugs.   Family History:  His family history is  not on file.   Allergies No Known Allergies   Home Medications  Prior to Admission medications   Medication Sig Start Date End Date Taking? Authorizing Provider  apixaban  (ELIQUIS ) 5 MG TABS tablet Take 1 tablet (5 mg total) by mouth 2 (two) times daily. 02/11/24 03/12/24 Yes Modena Andes, MD  atorvastatin  (LIPITOR ) 80 MG tablet Take 1 tablet (80 mg total) by mouth daily. 02/10/24 03/11/24 Yes Modena Andes, MD  butalbital -acetaminophen -caffeine  (FIORICET ) 50-325-40 MG tablet Take 1 tablet by mouth every 6 (six) hours as needed (refractory HA.). 03/05/24  Yes Leona Rake, MD  ondansetron  (ZOFRAN ) 4 MG tablet Take 1 tablet (4 mg total) by mouth every 6 (six) hours as needed for nausea. 01/29/24  Yes Audria Leather, MD  pantoprazole  (PROTONIX ) 40 MG tablet Take 1 tablet (40 mg total) by mouth daily. Patient taking differently: Take 40 mg by mouth daily before breakfast. 03/05/24  Yes Adhikari, Corene Devonshire, MD  topiramate  (TOPAMAX ) 25 MG tablet Take 1 tablet (25 mg total) by mouth daily. 03/05/24  Yes Leona Rake, MD     Critical care time: 33      Fay Hoop MD [facility] Internal Medicine Program - PGY-1 03/11/2024, 8:00 AM Pager# [phone]

## 2024-03-11 NOTE — Plan of Care (Signed)
  Problem: Education: Goal: Knowledge of General Education information will improve Description: Including pain rating scale, medication(s)/side effects and non-pharmacologic comfort measures Outcome: Not Progressing   Problem: Health Behavior/Discharge Planning: Goal: Ability to manage health-related needs will improve Outcome: Not Progressing   Problem: Clinical Measurements: Goal: Ability to maintain clinical measurements within normal limits will improve Outcome: Not Progressing Goal: Will remain free from infection Outcome: Not Progressing Goal: Diagnostic test results will improve Outcome: Not Progressing Goal: Respiratory complications will improve Outcome: Not Progressing Goal: Cardiovascular complication will be avoided Outcome: Not Progressing   Problem: Activity: Goal: Risk for activity intolerance will decrease Outcome: Not Progressing   Problem: Nutrition: Goal: Adequate nutrition will be maintained Outcome: Not Progressing   Problem: Coping: Goal: Level of anxiety will decrease Outcome: Not Progressing   Problem: Elimination: Goal: Will not experience complications related to bowel motility Outcome: Not Progressing Goal: Will not experience complications related to urinary retention Outcome: Not Progressing   Problem: Pain Managment: Goal: General experience of comfort will improve and/or be controlled Outcome: Not Progressing   Problem: Safety: Goal: Ability to remain free from injury will improve Outcome: Not Progressing   Problem: Skin Integrity: Goal: Risk for impaired skin integrity will decrease Outcome: Not Progressing   Problem: Education: Goal: Understanding of CV disease, CV risk reduction, and recovery process will improve Outcome: Not Progressing Goal: Individualized Educational Video(s) Outcome: Not Progressing   Problem: Activity: Goal: Ability to return to baseline activity level will improve Outcome: Not Progressing   Problem:  Cardiovascular: Goal: Ability to achieve and maintain adequate cardiovascular perfusion will improve Outcome: Not Progressing Goal: Vascular access site(s) Level 0-1 will be maintained Outcome: Not Progressing   Problem: Health Behavior/Discharge Planning: Goal: Ability to safely manage health-related needs after discharge will improve Outcome: Not Progressing

## 2024-03-11 NOTE — Procedures (Addendum)
 Patient Name: Samuel Warren  MRN: [id number]  Epilepsy Attending: Arleene Lack  Referring Physician/Provider: Consuelo Denmark, MD  Duration: 03/10/2024 2212 to 03/11/2024 1053   Patient history: 33yo M with new onset R sided weakness. EEG to evaluate for seizure   Level of alertness: Awake, asleep   AEDs during EEG study: TPM   Technical aspects: This EEG study was done with scalp electrodes positioned according to the 10-20 International system of electrode placement. Electrical activity was reviewed with band pass filter of 1-70Hz , sensitivity of 7 uV/mm, display speed of 33mm/sec with a 60Hz  notched filter applied as appropriate. EEG data were recorded continuously and digitally stored.  Video monitoring was available and reviewed as appropriate.   Description: The posterior dominant rhythm consists of 8 Hz activity of moderate voltage (25-35 uV) seen predominantly in posterior head regions, symmetric and reactive to eye opening and eye closing. Sleep was characterized by vertex waves, sleep spindles (12-14hz ), maximal fronto-central region. EEG showed intermittent generalized and lateralized left hemisphere 3 to 6 Hz theta-delta slowing. Hyperventilation and photic stimulation were not performed.      ABNORMALITY -Intermittent slow, generalized  and lateralized left hemisphere   IMPRESSION: This study is suggestive of cortical dysfunction in left hemisphere likely secondary to underlying structural abnormality. Additionally there is mild diffuse encephalopathy. No seizures or epileptiform discharges were seen throughout the recording.  EEG appears to be improving compared to previous day.   Samuel Warren

## 2024-03-11 NOTE — Telephone Encounter (Signed)
 Noted.

## 2024-03-11 NOTE — Progress Notes (Addendum)
 [facility] ADULT ICU REPLACEMENT PROTOCOL FOR AM LAB REPLACEMENT ONLY  The patient does apply for the [facility] Adult ICU Electrolyte Replacment Protocol based on the criteria listed below:   1. Is GFR >/= 40 ml/min? Yes.    Patient's GFR today is 106.3 2. Is urine output >/= 0.5 ml/kg/hr for the last 6 hours? Yes.   Patient's UOP is 1.63 ml/kg/hr 3. Is BUN < 60 mg/dL? Yes.    Patient's BUN today is 5 4. Abnormal electrolyte(s): K (2.9), Mg (1.9) 5. Ordered repletion with: Kcl 40 mEq PO x 3 doses, magnesium  sulfate 2 grams IV x 1 dose 6. If a panic level lab has been reported, has the CCM MD in charge been notified? No.  Physician:  N/A  Juleen Oakland, PharmD PGY1 Pharmacy Resident 03/11/2024 7:54 AM

## 2024-03-11 NOTE — Progress Notes (Signed)
 PHARMACY - ANTICOAGULATION  Pharmacy Consult for heparin  Indication:  Venous sinus thrombosis  Brief A/P: aPTT subtherapeutic Increase Heparin  rate  No Known Allergies  Patient Measurements: Height: 5\' 4"  (162.6 cm) Weight: 65 kg (143 lb 4.8 oz) IBW/kg (Calculated) : 59.2 HEPARIN  DW (KG): 65  Vital Signs: Temp: 98.1 F (36.7 C) (05/01 0007) Temp Source: Oral (05/01 0007) BP: 118/78 (05/01 0000) Pulse Rate: 52 (05/01 0100)  Labs: Recent Labs    03/08/24 1403 03/08/24 1403 03/08/24 1829 03/08/24 2117 03/09/24 0543 03/09/24 0947 03/10/24 0452 03/10/24 1032 03/10/24 1745 03/11/24 0042  HGB 13.6  --  14.6  --  13.8  --  11.7* 12.3*  --   --   HCT 40.0  --  44.4  --  41.7  --  35.8* 37.5*  --   --   PLT  --    < > 230  --  245  --  229 224  --   --   APTT  --   --  47*   < > 77*   < > 91*  --  57* 64*  LABPROT  --   --  17.1*  --   --   --   --   --   --   --   INR  --   --  1.4*  --   --   --   --   --   --   --   HEPARINUNFRC  --   --   --    < > >1.10*  --  >1.10*  --   --  0.72*  CREATININE 0.80  --  1.09  --   --   --  0.98  --   --   --   CKTOTAL  --   --  262  --   --   --   --   --   --   --    < > = values in this interval not displayed.    Estimated Creatinine Clearance: 88.9 mL/min (by C-G formula based on SCr of 0.98 mg/dL).   Assessment:  33 y.o. male with recurrent venous sinus thrombosis s/p attempted intervention in IR, Eliquis  on hold, for heparin   Goal of Therapy:  Heparin  level 0.3-0.5 units/ml aPTT: 66-85 seconds Monitor platelets by anticoagulation protocol: Yes  Plan: Increase Heparin  950 units/hr Check aPTT in 8 hours   Claudine Cullens, PharmD, BCPS  03/11/2024  1:33 AM

## 2024-03-11 NOTE — Progress Notes (Signed)
 vLTM discontinued  No skin breakdown noted at all skin sites  [facility] notified

## 2024-03-12 ENCOUNTER — Ambulatory Visit: Admitting: Physical Therapy

## 2024-03-12 ENCOUNTER — Encounter: Admitting: Occupational Therapy

## 2024-03-12 DIAGNOSIS — E785 Hyperlipidemia, unspecified: Secondary | ICD-10-CM | POA: Diagnosis not present

## 2024-03-12 DIAGNOSIS — G9340 Encephalopathy, unspecified: Secondary | ICD-10-CM | POA: Diagnosis not present

## 2024-03-12 DIAGNOSIS — I676 Nonpyogenic thrombosis of intracranial venous system: Secondary | ICD-10-CM | POA: Diagnosis not present

## 2024-03-12 DIAGNOSIS — G08 Intracranial and intraspinal phlebitis and thrombophlebitis: Secondary | ICD-10-CM | POA: Diagnosis not present

## 2024-03-12 DIAGNOSIS — R1310 Dysphagia, unspecified: Secondary | ICD-10-CM | POA: Diagnosis not present

## 2024-03-12 DIAGNOSIS — G936 Cerebral edema: Secondary | ICD-10-CM | POA: Diagnosis not present

## 2024-03-12 LAB — GLUCOSE, CAPILLARY
Glucose-Capillary: 108 mg/dL — ABNORMAL HIGH (ref 70–99)
Glucose-Capillary: 113 mg/dL — ABNORMAL HIGH (ref 70–99)
Glucose-Capillary: 115 mg/dL — ABNORMAL HIGH (ref 70–99)
Glucose-Capillary: 122 mg/dL — ABNORMAL HIGH (ref 70–99)
Glucose-Capillary: 122 mg/dL — ABNORMAL HIGH (ref 70–99)
Glucose-Capillary: 147 mg/dL — ABNORMAL HIGH (ref 70–99)

## 2024-03-12 LAB — BASIC METABOLIC PANEL WITH GFR
Anion gap: 8 (ref 5–15)
BUN: <5 mg/dL — ABNORMAL LOW (ref 6–20)
CO2: 18 mmol/L — ABNORMAL LOW (ref 22–32)
Calcium: 9.2 mg/dL (ref 8.9–10.3)
Chloride: 117 mmol/L — ABNORMAL HIGH (ref 98–111)
Creatinine, Ser: 0.76 mg/dL (ref 0.61–1.24)
GFR, Estimated: >60 mL/min (ref >60)
Glucose, Bld: 111 mg/dL — ABNORMAL HIGH (ref 70–99)
Potassium: 3.7 mmol/L (ref 3.5–5.1)
Sodium: 143 mmol/L (ref 135–145)

## 2024-03-12 LAB — SODIUM
Sodium: 143 mmol/L (ref 135–145)
Sodium: 143 mmol/L (ref 135–145)
Sodium: 141 mmol/L (ref 135–145)

## 2024-03-12 LAB — CBC
HCT: 41.1 % (ref 39.0–52.0)
Hemoglobin: 13.2 g/dL (ref 13.0–17.0)
MCH: 26.3 pg (ref 26.0–34.0)
MCHC: 32.1 g/dL (ref 30.0–36.0)
MCV: 81.9 fL (ref 80.0–100.0)
Platelets: 239 10*3/uL (ref 150–400)
RBC: 5.02 MIL/uL (ref 4.22–5.81)
RDW: 12.7 % (ref 11.5–15.5)
WBC: 7.2 10*3/uL (ref 4.0–10.5)
nRBC: 0 % (ref 0.0–0.2)

## 2024-03-12 LAB — MAGNESIUM: Magnesium: 2.2 mg/dL (ref 1.7–2.4)

## 2024-03-12 LAB — PHOSPHORUS: Phosphorus: 3.3 mg/dL (ref 2.5–4.6)

## 2024-03-12 MED ORDER — SODIUM CHLORIDE 3 % IV SOLN
INTRAVENOUS | Status: AC
  Filled 2024-03-12: qty 500

## 2024-03-12 MED ORDER — ACETAZOLAMIDE 250 MG PO TABS
500.0000 mg | ORAL_TABLET | Freq: Two times a day (BID) | ORAL | Status: AC
  Administered 2024-03-12 (×2): 500 mg via ORAL
  Filled 2024-03-12 (×2): qty 2

## 2024-03-12 MED ORDER — BUTALBITAL-APAP-CAFFEINE 50-325-40 MG PO TABS
1.0000 | ORAL_TABLET | Freq: Three times a day (TID) | ORAL | Status: DC | PRN
  Administered 2024-03-12 – 2024-03-18 (×4): 1 via ORAL
  Filled 2024-03-12 (×4): qty 1

## 2024-03-12 NOTE — Progress Notes (Addendum)
 STROKE TEAM PROGRESS NOTE   INTERIM HISTORY/SUBJECTIVE Taper 3%. Still having headaches, will order diamox  to see if this helps with headaches.   OBJECTIVE  CBC    Component Value Date/Time   WBC 7.2 03/12/2024 0631   RBC 5.02 03/12/2024 0631   HGB 13.2 03/12/2024 0631   HCT 41.1 03/12/2024 0631   PLT 239 03/12/2024 0631   MCV 81.9 03/12/2024 0631   MCH 26.3 03/12/2024 0631   MCHC 32.1 03/12/2024 0631   RDW 12.7 03/12/2024 0631   LYMPHSABS 1.5 03/10/2024 1032   MONOABS 1.0 03/10/2024 1032   EOSABS 0.0 03/10/2024 1032   BASOSABS 0.0 03/10/2024 1032    BMET    Component Value Date/Time   NA 143 03/12/2024 0631   K 3.7 03/12/2024 0631   CL 117 (H) 03/12/2024 0631   CO2 18 (L) 03/12/2024 0631   GLUCOSE 111 (H) 03/12/2024 0631   BUN <5 (L) 03/12/2024 0631   CREATININE 0.76 03/12/2024 0631   CALCIUM  9.2 03/12/2024 0631   GFRNONAA >60 03/12/2024 0631    IMAGING past 24 hours No results found.   Vitals:   03/12/24 0500 03/12/24 0600 03/12/24 0700 03/12/24 0728  BP: 122/78 114/79 118/72   Pulse: (!) 44 (!) 43 (!) 44   Resp: 13 14 13    Temp:    97.6 F (36.4 C)  TempSrc:    Oral  SpO2: 98% 99% 98%   Weight:      Height:         PHYSICAL EXAM General:  Alert, well-nourished, well-developed patient in no acute distress Psych:  Mood and affect appropriate for situation CV: Regular rate and rhythm on monitor Respiratory:  Regular, unlabored respirations on room air GI: Abdomen soft and nontender NEURO:  Pt is awake, alert, eyes open, smile to provider. No gaze palsy, tracking bilaterally, blinking to visual threat bilaterally, PERRL. No facial droop. Tongue midline. Bilateral UEs 5/5, no drift. Bilaterally LEs 5/5, no drift. b/l FTN intact, gait not tested.     ASSESSMENT/PLAN  [name] is a 33 y.o. male with history of hx of deafness, stroke/extensive venous thrombosis on Eliquis  (discharged 02/10/2024), prior history of meningoencephalitis,  hyperlipidemia and GERD. CT head with no acute process. 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 CTA and CTV obtained. Heparin  gtt initiated. NIH on Admission 25  Worsening mental status, improved Patient seems more drowsy sleepy, hard to arouse on 4/29 CT repeat 4/29 showed increased cerebral edema Transferred to ICU for 3% saline with bolus S/p IR but not successful with IR Dr. Alvira Josephs Mental status much improved, back to basel ine now Tape down 3% saline  On LTM EEG - no seizure -> d/c  Persistent dural venous sinus thrombus:   Code Stroke CT head -  Unchanged small subacute left frontal lobe infarct in the setting of known dural venous sinus thrombosis. ASPECTS of 10. CTA head & neck No LVO CT Venogram Mild progression of extensive thrombus in the superior sagittal sinus posteriorly. Mild improvement of subocclusive thrombus in the  transverse and sigmoid sinuses bilaterally. Persistent bilateral cavernous sinus thrombosis. MRI  Evolving subacute venous ischemic changes involving the paramedian frontal lobes and likely the left occipital lobe, not significantly changed as compared to previous. Mild associated petechial hemorrhage at the posterior left frontal region, stable from prior.  4/29- Stat Head CT generalized brain swelling.  On personal review, seems more cerebral edema than 4/28 3/18-  2D Echo - EF 45-50% in 01/2024 EEG- negative for seizure, mild to moderate diffuse encephalopathy LDL 58 HgbA1c 5.7 VTE prophylaxis - Heparin  IV Eliquis  (apixaban ) daily prior to admission, now on eliquis   Therapy recommendations:  CIR Disposition:  pending  Cerebral edema Transferred to ICU on 4/29 due to decreased LOC Hypertonic saline started at 67ml/hr->40cc -> 20cc for 12 hours and then d/c goal sodium 150-155 Serial neurochecks Q6 Na Na 144 --141--136-146-148-149-146 Add Na tab 2g tid Headaches- likely due to cerebral  edema - topamax  stopped 5/1 due to low bicarb.  Put on Diamox  500mg  bid x2 and BMP tomorrow if still low bicarb will need to consider alternatives  Hx of CVST Admitted 02/01/2024 for CVST.  Status post IR for thrombectomy and discharged on Eliquis . Readmitted 03/02/2024 for right-sided weakness.  CT head showed increased left frontal venous infarct.  CTV showed recurrent occlusive and subocclusive thrombi within the SSS posteriorly, extending to the confluence of sinus.  Thrombus extending into the medial aspect of the right transverse sinus.  Clot burden within the right sigmoid sinus has decreased.  Patient was concerning for dehydration at the time, and received fluid resuscitation.  Discharged in good condition However, per wife, patient discharged on 4/25 Friday, at the night of that day, patient drowsy sleepy not talking.  Saturday some improvement but not eating and drinking well able to take medication.  Sunday she did a poorly, drowsy sleepy not talking not eating, however wife was able to get medication down.  Monday yesterday, he was sent to ER for evaluation.  Hx of Stroke Recent viral meningitis January 27, 2024 right frontal 2-3 punctate infarcts, etiology: Likely due to infectious vasculitis vs. Small vessel disease in the setting of CNS infection  CSF at that time WBC 22, RBC 1330, protein 91, glucose 80, concerning for viral meningitis Completed course of Doxy No need LP repeat at this time given no sign of CNS infection and especially with cerebral edema and low-lying cerebellar tonsils extending up to 9 mm below the foreman magnum  Hyperlipidemia Home meds:  Atorvastatin  80, resumed LDL in 01/2024 279 This admission LDL 58, goal < 70 Continue statin on discharge  Dysphagia SLP consulted On dys 3 and thin liquid Advance diet as tolerated  Other Active Problems Hypokalemia K 3.2 - 2.9 - supplement -> 3.7  Hospital day # 4  Patient seen and examined by NP/APP with MD. MD to  update note as needed.   Imogene Mana, DNP, FNP-BC [facility] Pager: [phone]  ATTENDING NOTE: I reviewed above note and agree with the assessment and plan. Pt was seen and examined.   Wife and RN are at the bedside. Pt lying in bed, awake alert, tracking, with body language and pain chart, he pointed to 8/10 HA. Moving all extremities, no gaze palsy or neglect. Wife is feeding him breakfast. Na 143, will taper off 3% saline, continue Na tab. Put on diamox  500mg  bid to reduce CSF production to help for cerebral edema and HA. Will check BMP in am. Continue eliquis  and statin.   For detailed assessment and plan, please refer to above as I have made changes wherever appropriate.   Consuelo Denmark, MD PhD Stroke Neurology 03/12/2024 12:44 PM  This patient is critically ill due to persistent CVST, cerebral edema, needing thrombectomy and at significant risk of neurological worsening, death form brain herniation, worsening neuro status. This patient's care requires constant monitoring of vital signs, hemodynamics, respiratory and cardiac monitoring, review  of multiple databases, neurological assessment, discussion with family, other specialists and medical decision making of high complexity. I spent 35 minutes of neurocritical care time in the care of this patient.    To contact Stroke Continuity provider, please refer to [email or web address]. After hours, contact General Neurology

## 2024-03-12 NOTE — Plan of Care (Signed)
  Problem: Education: Goal: Knowledge of General Education information will improve Description: Including pain rating scale, medication(s)/side effects and non-pharmacologic comfort measures Outcome: Not Progressing   Problem: Health Behavior/Discharge Planning: Goal: Ability to manage health-related needs will improve Outcome: Not Progressing   Problem: Clinical Measurements: Goal: Ability to maintain clinical measurements within normal limits will improve Outcome: Not Progressing Goal: Will remain free from infection Outcome: Not Progressing Goal: Diagnostic test results will improve Outcome: Not Progressing Goal: Respiratory complications will improve Outcome: Not Progressing Goal: Cardiovascular complication will be avoided Outcome: Not Progressing   Problem: Activity: Goal: Risk for activity intolerance will decrease Outcome: Not Progressing   Problem: Nutrition: Goal: Adequate nutrition will be maintained Outcome: Not Progressing   Problem: Coping: Goal: Level of anxiety will decrease Outcome: Not Progressing   Problem: Elimination: Goal: Will not experience complications related to bowel motility Outcome: Not Progressing Goal: Will not experience complications related to urinary retention Outcome: Not Progressing   Problem: Pain Managment: Goal: General experience of comfort will improve and/or be controlled Outcome: Not Progressing   Problem: Safety: Goal: Ability to remain free from injury will improve Outcome: Not Progressing   Problem: Skin Integrity: Goal: Risk for impaired skin integrity will decrease Outcome: Not Progressing   Problem: Education: Goal: Understanding of CV disease, CV risk reduction, and recovery process will improve Outcome: Not Progressing Goal: Individualized Educational Video(s) Outcome: Not Progressing   Problem: Activity: Goal: Ability to return to baseline activity level will improve Outcome: Not Progressing   Problem:  Cardiovascular: Goal: Ability to achieve and maintain adequate cardiovascular perfusion will improve Outcome: Not Progressing Goal: Vascular access site(s) Level 0-1 will be maintained Outcome: Not Progressing   Problem: Health Behavior/Discharge Planning: Goal: Ability to safely manage health-related needs after discharge will improve Outcome: Not Progressing

## 2024-03-12 NOTE — Progress Notes (Addendum)
 NAMEJefery Warren, MRN:  [id number], DOB:  [birth year], LOS: 4 ADMISSION DATE:  03/08/2024, CONSULTATION DATE:  2/29/2025 REFERRING MD:  EDP, CHIEF COMPLAINT:  Severe Headache    History of Present Illness:  This is a 54 year man with a history of meningioma history of meningeal encephalitis, venous sinus thrombosis on Eliquis  and deafness who presented to the ED on 4/28 as a code stroke and found to have a new onset of right sided weakness.  This patient has had previous CVA health issues in the past with multiple thrombectomies. He had his third thrombectomy with Dr  Luellen Sages, MD on 4/29.  He is been admitted to the ICU for high risk intubation .  Pertinent  Medical History   Past Medical History:  Diagnosis Date   Deaf      Significant Hospital Events: Including procedures, antibiotic start and stop dates in addition to other pertinent events   4/28 Admitted 4/28 Code stroke called on him 4/29 Radiology with anesthesia - IR thrombectomy veno mesh-  Unsuccessful   4/30 Continuous EEG   Interim History / Subjective:   No acute issues overnight  Objective   Blood pressure 118/72, pulse (!) 44, temperature 97.6 F (36.4 C), temperature source Oral, resp. rate 13, height 5\' 4"  (1.626 m), weight 65 kg, SpO2 98%.        Intake/Output Summary (Last 24 hours) at 03/12/2024 0732 Last data filed at 03/12/2024 0700 Gross per 24 hour  Intake 1174.32 ml  Output 1500 ml  Net -325.68 ml   Filed Weights   03/08/24 1400  Weight: 65 kg    Examination: General: Laying in bed, no acute distress HENT: No thyroid masses, atraumatic head, normocephalic  Lungs: Clear lung bilaterally  Cardiovascular: RRR Abdomen: soft, non tender  Extremities: warm and dry  Neuro: Moving all extremities , non verbal, strength in tact GU: n/a  Resolved Hospital Problem list   None   Assessment & Plan:  Chronic dural sinus thrombosis Cerebral edema Acute Encephalopathy headaches - S/p  attempt thrombectomy 4/29 - Neurology is following  - Keep Na 150-155 - Continue hypertonic solution  - started on 2g sodium tabs - diamox  ordered today  Best Practice (right click and "Reselect all SmartList Selections" daily)   Diet/type: NPO DVT prophylaxis: not indicated GI prophylaxis: PPI Lines: Central line Foley:  N/A Code Status:  full code Last date of multidisciplinary goals of care discussion [N/A]  Labs   CBC: Recent Labs  Lab 03/08/24 1829 03/09/24 0543 03/10/24 0452 03/10/24 1032 03/11/24 0624 03/12/24 0631  WBC 12.6* 10.3 9.0 9.3 7.5 7.2  NEUTROABS 10.1*  --   --  6.8  --   --   HGB 14.6 13.8 11.7* 12.3* 11.9* 13.2  HCT 44.4 41.7 35.8* 37.5* 36.2* 41.1  MCV 81.3 80.5 81.7 83.1 82.1 81.9  PLT 230 245 229 224 208 239    Basic Metabolic Panel: Recent Labs  Lab 03/08/24 1403 03/08/24 1829 03/09/24 1330 03/10/24 0452 03/10/24 1300 03/11/24 0042 03/11/24 0600 03/11/24 0621 03/11/24 1202 03/11/24 1826 03/11/24 2340  NA 144 141   < > 148*   < > 147*  --  146* 146* 144 143  K 3.6 3.8  --  3.2*  --   --   --  2.9*  --   --   --   CL 115* 109  --  122*  --   --   --  120*  --   --   --  CO2  --  18*  --  17*  --   --   --  18*  --   --   --   GLUCOSE 111* 120*  --  118*  --   --   --  111*  --   --   --   BUN 7 8  --  8  --   --   --  5*  --   --   --   CREATININE 0.80 1.09  --  0.98  --   --   --  0.82  --   --   --   CALCIUM   --  9.3  --  8.8*  --   --   --  8.5*  --   --   --   MG  --   --   --  1.8  --   --  1.9  --   --   --   --    < > = values in this interval not displayed.   GFR: Estimated Creatinine Clearance: 106.3 mL/min (by C-G formula based on SCr of 0.82 mg/dL). Recent Labs  Lab 03/08/24 2117 03/09/24 0543 03/10/24 0452 03/10/24 1032 03/11/24 0624 03/12/24 0631  PROCALCITON <0.10  --   --   --   --   --   WBC  --    < > 9.0 9.3 7.5 7.2   < > = values in this interval not displayed.    Liver Function Tests: Recent Labs   Lab 03/08/24 1829 03/10/24 0452  AST 25 25  ALT 42 41  ALKPHOS 66 52  BILITOT 0.8 0.5  PROT 7.3 6.1*  ALBUMIN 4.2 3.4*   No results for input(s): "LIPASE", "AMYLASE" in the last 168 hours. Recent Labs  Lab 03/09/24 0947  AMMONIA 26    ABG    Component Value Date/Time   PHART 7.44 03/09/2024 1250   PCO2ART 26 (L) 03/09/2024 1250   PO2ART 100 03/09/2024 1250   HCO3 17.7 (L) 03/09/2024 1250   TCO2 17 (L) 03/08/2024 1403   ACIDBASEDEF 4.8 (H) 03/09/2024 1250   O2SAT 99.5 03/09/2024 1250     Coagulation Profile: Recent Labs  Lab 03/08/24 1829  INR 1.4*    Cardiac Enzymes: Recent Labs  Lab 03/08/24 1829  CKTOTAL 262    HbA1C: Hgb A1c MFr Bld  Date/Time Value Ref Range Status  01/26/2024 08:47 PM 5.7 (H) 4.8 - 5.6 % Final    Comment:    (NOTE) Pre diabetes:          5.7%-6.4%  Diabetes:              >6.4%  Glycemic control for   <7.0% adults with diabetes     CBG: Recent Labs  Lab 03/11/24 1511 03/11/24 2010 03/12/24 0005 03/12/24 0345 03/12/24 0727  GLUCAP 110* 110* 108* 113* 115*    Critical care time: 31 minutes     Duaine German, MD [facility] Office: [phone]   See Amion for personal pager PCCM on call pager [phone] until 7pm. Please call Elink 7p-7a. [phone]

## 2024-03-12 NOTE — Telephone Encounter (Signed)
 Noted! Ppw placed in front office filing cabinet. Left detailed message advising [name] of update.

## 2024-03-12 NOTE — Progress Notes (Signed)
   Inpatient Rehabilitation Admissions Coordinator   I will place rehab consult for possible CIR admit.  Jeannetta Millman, RN, MSN Rehab Admissions Coordinator [phone] 03/12/2024 10:30 PM

## 2024-03-12 NOTE — Progress Notes (Signed)
 eLink Physician-Brief Progress Note Patient Name: Samuel Warren DOB: 03/04/1991 MRN: [id number]   Date of Service  03/12/2024  HPI/Events of Note  49 year man with a history of meningioma history of meningeal encephalitis, venous sinus thrombosis on Eliquis  and deafness who presented to the ED on 4/28 as a code stroke.   Patient is complaining of a headache but the patient is also refusing all medications.  Unclear whether the patient understands the risks, benefits, and alternatives to therapy thus far  eICU Interventions  Patient seems to have found some relief with administration of fentanyl , may need additional dosing.  PM meds are still due.     Intervention Category Intermediate Interventions: Pain - evaluation and management  Alzada Brazee 03/12/2024, 9:06 PM

## 2024-03-12 NOTE — Progress Notes (Signed)
 Occupational Therapy Treatment Patient Details Name: Samuel Warren MRN: [id number] DOB: February 05, 1991 Today's Date: 03/12/2024   History of present illness Pt is a 33 y/o M presenting to ED on 4/29 after being found on the ground with R weakness, CT head with unchanged small subacute L frontal lobe infarct, CTA with worsening venous thrombus. RR called 4/29 and transferred to ICU for worsening encephalopathy. Recent admission for similar, found to be orthostatic and dehydrated 4/25. S/p bil common carotid angiograms and internal jugular venograms on 4/29, unsuccessful thrombectomy. PMH includes deafness, stroke/extensive venous thrombus on Eliquis , meningoencephalitis, HLD, GERD   OT comments  Patient with good progress toward patient focused goals.  Patient able to advance OOB, take a few steps with Mod to Max A of two and transfer to the recliner.  OT to continue efforts in the acute setting to address deficits, and Patient will benefit from intensive inpatient follow-up therapy, >3 hours/day.  Patient would benefit from more intensive rehab to maximize his functional status prior to returning home.        If plan is discharge home, recommend the following:  Two people to help with walking and/or transfers;A lot of help with bathing/dressing/bathroom;Assistance with cooking/housework;Direct supervision/assist for financial management;Direct supervision/assist for medications management;Assist for transportation;Help with stairs or ramp for entrance   Equipment Recommendations  None recommended by OT    Recommendations for Other Services Rehab consult    Precautions / Restrictions Precautions Precautions: Fall Recall of Precautions/Restrictions: Intact Precaution/Restrictions Comments: unable to fully assess due to communication barriers Restrictions Weight Bearing Restrictions Per Provider Order: No       Mobility Bed Mobility Overal bed mobility: Needs Assistance Bed Mobility: Supine to  Sit     Supine to sit: Mod assist          Transfers Overall transfer level: Needs assistance Equipment used: 2 person hand held assist Transfers: Bed to chair/wheelchair/BSC, Sit to/from Stand Sit to Stand: Mod assist, +2 physical assistance     Step pivot transfers: Max assist, +2 physical assistance           Balance Overall balance assessment: Needs assistance Sitting-balance support: No upper extremity supported, Feet unsupported Sitting balance-Leahy Scale: Poor   Postural control: Right lateral lean, Left lateral lean, Posterior lean Standing balance support: Bilateral upper extremity supported Standing balance-Leahy Scale: Poor                             ADL either performed or assessed with clinical judgement   ADL       Grooming: Minimal assistance;Sitting   Upper Body Bathing: Moderate assistance;Sitting   Lower Body Bathing: Sitting/lateral leans;Maximal assistance   Upper Body Dressing : Moderate assistance;Sitting   Lower Body Dressing: Maximal assistance;Sitting/lateral leans;Sit to/from stand   Toilet Transfer: Moderate assistance;Maximal assistance;+2 for physical assistance                  Extremity/Trunk Assessment Upper Extremity Assessment RUE Deficits / Details: 3/5 lifts against gravity, grip is improving RUE Sensation: decreased light touch;decreased proprioception RUE Coordination: decreased fine motor;decreased gross motor   Lower Extremity Assessment Lower Extremity Assessment: Defer to PT evaluation   Cervical / Trunk Assessment Cervical / Trunk Assessment: Kyphotic    Vision   Vision Assessment?: No apparent visual deficits Additional Comments: ? R inattention   Perception Perception Perception: Impaired Preception Impairment Details: Inattention/Neglect   Praxis Praxis Praxis: WFL   Communication Communication Communication: Impaired  Factors Affecting Communication: Hearing impaired;Other  (comment)   Cognition Arousal: Alert Behavior During Therapy: WFL for tasks assessed/performed Cognition: No apparent impairments Difficult to assess due to: Non-English speaking, Hard of hearing/deaf                             Following commands: Intact Following commands impaired: Only follows one step commands consistently      Cueing   Cueing Techniques: Gestural cues, Tactile cues, Visual cues  Exercises      Shoulder Instructions       General Comments      Pertinent Vitals/ Pain       Pain Assessment Pain Assessment: Faces Faces Pain Scale: Hurts a little bit Pain Location: posterior headache Pain Descriptors / Indicators: Grimacing, Headache Pain Intervention(s): Monitored during session  Home Living                                          Prior Functioning/Environment              Frequency  Min 2X/week        Progress Toward Goals  OT Goals(current goals can now be found in the care plan section)  Progress towards OT goals: Progressing toward goals  Acute Rehab OT Goals OT Goal Formulation: With patient Time For Goal Achievement: 03/24/24 Potential to Achieve Goals: Good  Plan      Co-evaluation    PT/OT/SLP Co-Evaluation/Treatment: Yes Reason for Co-Treatment: For patient/therapist safety;Complexity of the patient's impairments (multi-system involvement)   OT goals addressed during session: ADL's and self-care      AM-PAC OT "6 Clicks" Daily Activity     Outcome Measure   Help from another person eating meals?: A Lot Help from another person taking care of personal grooming?: A Lot Help from another person toileting, which includes using toliet, bedpan, or urinal?: A Lot Help from another person bathing (including washing, rinsing, drying)?: A Lot Help from another person to put on and taking off regular upper body clothing?: A Lot Help from another person to put on and taking off regular lower body  clothing?: A Lot 6 Click Score: 12    End of Session Equipment Utilized During Treatment: Gait belt;Rolling walker (2 wheels)  OT Visit Diagnosis: Unsteadiness on feet (R26.81);Repeated falls (R29.6);Low vision, both eyes (H54.2);Muscle weakness (generalized) (M62.81);Other symptoms and signs involving the nervous system (R29.898);Other symptoms and signs involving cognitive function   Activity Tolerance Patient tolerated treatment well   Patient Left in chair;with call bell/phone within reach;with chair alarm set   Nurse Communication Mobility status        Time: 1610-9604 OT Time Calculation (min): 23 min  Charges: OT General Charges $OT Visit: 1 Visit OT Treatments $Self Care/Home Management : 8-22 mins  03/12/2024  RP, OTR/L  Acute Rehabilitation Services  Office:  [phone]   Benjamen Brand 03/12/2024, 3:16 PM

## 2024-03-12 NOTE — Progress Notes (Addendum)
 Physical Therapy Treatment Patient Details Name: Samuel Warren MRN: [id number] DOB: January 16, 1991 Today's Date: 03/12/2024   History of Present Illness 33 y.o. male admitted 03/09/24 with R-side weakness. Head CT with unchanged small subacute L frontal infarct. CTA with worsening dural venous sinus thrombus. S/p bilateral common carotid angiograms, internal jugular venograms and unsuccessful thrombectomy on 4/29. EEG 4/30 without seizures. 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   PT Comments  Pt progressing with mobility. Today's session focused on initiating standing transfers and gait training with mod-maxA+2. Pt limited by weakness, decreased activity tolerance, impaired balance strategies/postural reactions and R-side inattention. Continue to recommend intensive AIR-level therapies (>3 hrs/day) to maximize functional mobility and independence prior to return home.     If plan is discharge home, recommend the following: A lot of help with walking and/or transfers;A lot of help with bathing/dressing/bathroom;Assistance with cooking/housework;Assist for transportation;Help with stairs or ramp for entrance   Can travel by private vehicle      No  Equipment Recommendations   (defer to next venue; potential w/c)    Recommendations for Other Services       Precautions / Restrictions Precautions Precautions: Fall;Other (comment) Precaution/Restrictions Comments: R-side inattention(?) Restrictions Weight Bearing Restrictions Per Provider Order: No     Mobility  Bed Mobility Overal bed mobility: Needs Assistance Bed Mobility: Supine to Sit     Supine to sit: Mod assist, HOB elevated     General bed mobility comments: modA for trunk elevation and scooting hips to EOB    Transfers Overall transfer level: Needs assistance Equipment used: 2 person hand held assist, Rolling walker (2 wheels) Transfers: Sit to/from  Stand, Bed to chair/wheelchair/BSC Sit to Stand: Mod assist, +2 physical assistance   Step pivot transfers: Mod assist, +2 physical assistance       General transfer comment: initial sit>stand with minA+2 and step pivot transfer from bed>recliner with modA+2 for bilateral HHA and stability. additional sit<>stand from recliner to RW with modA+2, noted decreased movement initiation RUE/RLE    Ambulation/Gait Ambulation/Gait assistance: Mod assist, Max assist, +2 physical assistance Gait Distance (Feet): 4 Feet Assistive device: Rolling walker (2 wheels) Gait Pattern/deviations: Step-to pattern, Decreased stride length, Decreased dorsiflexion - right, Decreased step length - right Gait velocity: Decreased     General Gait Details: pt walking ~4' with RW and mod-maxA+2, chair follow. pt with R lateral lean though improved with forward weight translation, initially taking complete steps bilaterally though requiring increased cues to step with RLE; seated rest due to fatigue   Stairs             Wheelchair Mobility     Tilt Bed    Modified Rankin (Stroke Patients Only)       Balance Overall balance assessment: Needs assistance Sitting-balance support: No upper extremity supported, Feet unsupported Sitting balance-Leahy Scale: Poor Sitting balance - Comments: R lateral lean this session with difficulty self-correcting     Standing balance-Leahy Scale: Poor Standing balance comment: reliant on UE support and external assist for static standing                            Communication Communication Communication: Impaired Factors Affecting Communication: Hearing impaired;Other (comment)  Cognition Arousal: Alert Behavior During Therapy: WFL for tasks assessed/performed   PT - Cognitive impairments: Difficult to assess Difficult to assess due to: Hard of hearing/deaf, Non-English speaking  PT - Cognition Comments: h/o deafness  baseline. pt following gestural commands appropriately. noted decreased initiation at times, especially with R-side (inattention?) Following commands: Intact Following commands impaired: Only follows one step commands consistently    Cueing Cueing Techniques: Gestural cues, Tactile cues, Visual cues  Exercises Other Exercises Other Exercises: seated R shoulder flex/abd, R hip flex, R knee ext    General Comments General comments (skin integrity, edema, etc.): BP 103/76, HR 80, SpO2 97% on RA      Pertinent Vitals/Pain Pain Assessment Pain Assessment: Faces Faces Pain Scale: Hurts a little bit Pain Location: headache Pain Descriptors / Indicators: Grimacing, Headache Pain Intervention(s): Monitored during session, Limited activity within patient's tolerance    Home Living                          Prior Function            PT Goals (current goals can now be found in the care plan section) Progress towards PT goals: Progressing toward goals    Frequency    Min 1X/week      PT Plan      Co-evaluation PT/OT/SLP Co-Evaluation/Treatment: Yes Reason for Co-Treatment: For patient/therapist safety;Complexity of the patient's impairments (multi-system involvement);To address functional/ADL transfers PT goals addressed during session: Mobility/safety with mobility;Balance OT goals addressed during session: ADL's and self-care      AM-PAC PT "6 Clicks" Mobility   Outcome Measure  Help needed turning from your back to your side while in a flat bed without using bedrails?: A Lot Help needed moving from lying on your back to sitting on the side of a flat bed without using bedrails?: A Lot Help needed moving to and from a bed to a chair (including a wheelchair)?: A Lot Help needed standing up from a chair using your arms (e.g., wheelchair or bedside chair)?: A Lot Help needed to walk in hospital room?: Total Help needed climbing 3-5 steps with a railing? : Total 6  Click Score: 10    End of Session Equipment Utilized During Treatment: Gait belt Activity Tolerance: Patient tolerated treatment well Patient left: in chair;with call bell/phone within reach;with chair alarm set;with family/visitor present Nurse Communication: Mobility status PT Visit Diagnosis: Other abnormalities of gait and mobility (R26.89);Muscle weakness (generalized) (M62.81)     Time: 0454-0981 PT Time Calculation (min) (ACUTE ONLY): 24 min  Charges:    $Therapeutic Activity: 8-22 mins PT General Charges $$ ACUTE PT VISIT: 1 Visit                     Blase Bur, PT, DPT Acute Rehabilitation Services  Personal: Secure Chat Rehab Office: [phone]  Albino Hum 03/12/2024, 3:43 PM

## 2024-03-13 DIAGNOSIS — G936 Cerebral edema: Secondary | ICD-10-CM | POA: Diagnosis not present

## 2024-03-13 DIAGNOSIS — E785 Hyperlipidemia, unspecified: Secondary | ICD-10-CM | POA: Diagnosis not present

## 2024-03-13 DIAGNOSIS — I676 Nonpyogenic thrombosis of intracranial venous system: Secondary | ICD-10-CM | POA: Diagnosis not present

## 2024-03-13 DIAGNOSIS — G08 Intracranial and intraspinal phlebitis and thrombophlebitis: Secondary | ICD-10-CM | POA: Diagnosis not present

## 2024-03-13 DIAGNOSIS — G9340 Encephalopathy, unspecified: Secondary | ICD-10-CM | POA: Diagnosis not present

## 2024-03-13 DIAGNOSIS — R1310 Dysphagia, unspecified: Secondary | ICD-10-CM | POA: Diagnosis not present

## 2024-03-13 LAB — GLUCOSE, CAPILLARY
Glucose-Capillary: 126 mg/dL — ABNORMAL HIGH (ref 70–99)
Glucose-Capillary: 133 mg/dL — ABNORMAL HIGH (ref 70–99)
Glucose-Capillary: 112 mg/dL — ABNORMAL HIGH (ref 70–99)

## 2024-03-13 LAB — SODIUM: Sodium: 139 mmol/L (ref 135–145)

## 2024-03-13 LAB — BASIC METABOLIC PANEL WITH GFR
Anion gap: 7 (ref 5–15)
BUN: 6 mg/dL (ref 6–20)
CO2: 15 mmol/L — ABNORMAL LOW (ref 22–32)
Calcium: 9.5 mg/dL (ref 8.9–10.3)
Chloride: 117 mmol/L — ABNORMAL HIGH (ref 98–111)
Creatinine, Ser: 0.95 mg/dL (ref 0.61–1.24)
GFR, Estimated: >60 mL/min (ref >60)
Glucose, Bld: 112 mg/dL — ABNORMAL HIGH (ref 70–99)
Potassium: 3.3 mmol/L — ABNORMAL LOW (ref 3.5–5.1)
Sodium: 139 mmol/L (ref 135–145)

## 2024-03-13 LAB — CBC
HCT: 42.1 % (ref 39.0–52.0)
Hemoglobin: 13.6 g/dL (ref 13.0–17.0)
MCH: 26.7 pg (ref 26.0–34.0)
MCHC: 32.3 g/dL (ref 30.0–36.0)
MCV: 82.5 fL (ref 80.0–100.0)
Platelets: 241 10*3/uL (ref 150–400)
RBC: 5.1 MIL/uL (ref 4.22–5.81)
RDW: 12.6 % (ref 11.5–15.5)
WBC: 9.5 10*3/uL (ref 4.0–10.5)
nRBC: 0 % (ref 0.0–0.2)

## 2024-03-13 LAB — APTT: aPTT: 53 s — ABNORMAL HIGH (ref 24–36)

## 2024-03-13 LAB — PROTIME-INR
INR: 1.2 (ref 0.8–1.2)
Prothrombin Time: 15.2 s (ref 11.4–15.2)

## 2024-03-13 MED ORDER — VALPROATE SODIUM 100 MG/ML IV SOLN
500.0000 mg | Freq: Four times a day (QID) | INTRAVENOUS | Status: DC
  Administered 2024-03-13 – 2024-03-17 (×16): 500 mg via INTRAVENOUS
  Filled 2024-03-13 (×10): qty 5
  Filled 2024-03-13 (×2): qty 500
  Filled 2024-03-13 (×6): qty 5
  Filled 2024-03-13 (×2): qty 500

## 2024-03-13 MED ORDER — LACTATED RINGERS IV SOLN
INTRAVENOUS | Status: DC
Start: 2024-03-13 — End: 2024-03-13

## 2024-03-13 MED ORDER — SENNA 8.6 MG PO TABS
1.0000 | ORAL_TABLET | Freq: Two times a day (BID) | ORAL | Status: DC
  Administered 2024-03-13 – 2024-03-18 (×9): 8.6 mg via ORAL
  Filled 2024-03-13 (×9): qty 1

## 2024-03-13 MED ORDER — HEPARIN (PORCINE) 25000 UT/250ML-% IV SOLN
1050.0000 [IU]/h | INTRAVENOUS | Status: DC
  Administered 2024-03-13: 950 [IU]/h via INTRAVENOUS
  Administered 2024-03-14: 1050 [IU]/h via INTRAVENOUS
  Filled 2024-03-13 (×2): qty 250

## 2024-03-13 MED ORDER — DEXTROSE 5 % IV SOLN
1500.0000 mg | Freq: Once | INTRAVENOUS | Status: AC
  Administered 2024-03-13: 1500 mg via INTRAVENOUS
  Filled 2024-03-13: qty 15

## 2024-03-13 MED ORDER — POTASSIUM CHLORIDE 20 MEQ PO PACK
40.0000 meq | PACK | Freq: Once | ORAL | Status: DC
Start: 2024-03-13 — End: 2024-03-13

## 2024-03-13 MED ORDER — POTASSIUM CHLORIDE CRYS ER 20 MEQ PO TBCR
40.0000 meq | EXTENDED_RELEASE_TABLET | Freq: Once | ORAL | Status: AC
  Administered 2024-03-13: 40 meq via ORAL
  Filled 2024-03-13: qty 2

## 2024-03-13 MED ORDER — WARFARIN - PHARMACIST DOSING INPATIENT
Freq: Every day | Status: DC
  Administered 2024-03-15: 2.5

## 2024-03-13 MED ORDER — WARFARIN SODIUM 7.5 MG PO TABS
7.5000 mg | ORAL_TABLET | Freq: Once | ORAL | Status: AC
  Administered 2024-03-13: 7.5 mg via ORAL
  Filled 2024-03-13: qty 1

## 2024-03-13 MED ORDER — SODIUM CHLORIDE 0.9 % IV SOLN: INTRAVENOUS | Status: AC

## 2024-03-13 MED ORDER — WARFARIN SODIUM 7.5 MG PO TABS
7.5000 mg | ORAL_TABLET | Freq: Once | ORAL | Status: DC
Start: 2024-03-13 — End: 2024-03-13

## 2024-03-13 NOTE — Plan of Care (Signed)
  Problem: Education: Goal: Knowledge of General Education information will improve Description: Including pain rating scale, medication(s)/side effects and non-pharmacologic comfort measures Outcome: Not Progressing   Problem: Health Behavior/Discharge Planning: Goal: Ability to manage health-related needs will improve Outcome: Not Progressing   Problem: Clinical Measurements: Goal: Ability to maintain clinical measurements within normal limits will improve Outcome: Not Progressing Goal: Will remain free from infection Outcome: Not Progressing Goal: Diagnostic test results will improve Outcome: Not Progressing Goal: Respiratory complications will improve Outcome: Not Progressing Goal: Cardiovascular complication will be avoided Outcome: Not Progressing   Problem: Activity: Goal: Risk for activity intolerance will decrease Outcome: Not Progressing   Problem: Nutrition: Goal: Adequate nutrition will be maintained Outcome: Not Progressing   Problem: Coping: Goal: Level of anxiety will decrease Outcome: Not Progressing   Problem: Elimination: Goal: Will not experience complications related to bowel motility Outcome: Not Progressing Goal: Will not experience complications related to urinary retention Outcome: Not Progressing   Problem: Pain Managment: Goal: General experience of comfort will improve and/or be controlled Outcome: Not Progressing   Problem: Safety: Goal: Ability to remain free from injury will improve Outcome: Not Progressing   Problem: Skin Integrity: Goal: Risk for impaired skin integrity will decrease Outcome: Not Progressing   Problem: Education: Goal: Understanding of CV disease, CV risk reduction, and recovery process will improve Outcome: Not Progressing Goal: Individualized Educational Video(s) Outcome: Not Progressing   Problem: Activity: Goal: Ability to return to baseline activity level will improve Outcome: Not Progressing   Problem:  Cardiovascular: Goal: Ability to achieve and maintain adequate cardiovascular perfusion will improve Outcome: Not Progressing Goal: Vascular access site(s) Level 0-1 will be maintained Outcome: Not Progressing   Problem: Health Behavior/Discharge Planning: Goal: Ability to safely manage health-related needs after discharge will improve Outcome: Not Progressing

## 2024-03-13 NOTE — Progress Notes (Signed)
 NAMEKiron Warren, MRN:  [id number], DOB:  06-25-1991, LOS: 5 ADMISSION DATE:  03/08/2024, CONSULTATION DATE:  2/29/2025 REFERRING MD:  EDP, CHIEF COMPLAINT:  Severe Headache    History of Present Illness:  This is a 86 year man with a history of meningioma history of meningeal encephalitis, venous sinus thrombosis on Eliquis  and deafness who presented to the ED on 4/28 as a code stroke and found to have a new onset of right sided weakness.  This patient has had previous CVA health issues in the past with multiple thrombectomies. He had his third thrombectomy with Dr  Luellen Sages, MD on 4/29.  He is been admitted to the ICU for high risk intubation .  Pertinent  Medical History   Past Medical History:  Diagnosis Date   Deaf      Significant Hospital Events: Including procedures, antibiotic start and stop dates in addition to other pertinent events   4/28 Admitted 4/28 Code stroke called on him 4/29 Radiology with anesthesia - IR thrombectomy veno mesh-  Unsuccessful   4/30 Continuous EEG  5/2 hypertonic saline discontinued at 11pm  Interim History / Subjective:   No acute issues overnight Continue to have headache. Appears to have responded to IV fentanyl   Objective   Blood pressure 106/74, pulse (!) 54, temperature 98.1 F (36.7 C), temperature source Oral, resp. rate 13, height 5\' 4"  (1.626 m), weight 65 kg, SpO2 97%.        Intake/Output Summary (Last 24 hours) at 03/13/2024 0943 Last data filed at 03/13/2024 0800 Gross per 24 hour  Intake 941.93 ml  Output 1650 ml  Net -708.07 ml   Filed Weights   03/08/24 1400  Weight: 65 kg    Examination: General: Laying in bed, no acute distress HENT: moist mucous membranes, sclera anicteric  Lungs: Clear lung bilaterally  Cardiovascular: RRR Abdomen: soft, non tender  Extremities: warm and dry  Neuro: Moving all extremities , non verbal, strength in tact GU: n/a  Resolved Hospital Problem list   None    Assessment & Plan:  Chronic dural sinus thrombosis Cerebral edema Acute Encephalopathy headaches - S/p attempt thrombectomy 4/29 - Neurology is following  - Hypertonic saline discontinued 5/2, given diamox  5/2 - 2g sodium tabs  Hypokalemia - replete  Metabolic Acidosis - low serum bicarb in setting of diamox  adinistration - allow serum bicarb to normalize  Best Practice (right click and "Reselect all SmartList Selections" daily)   Diet/type: dysphagia diet (see orders) DVT prophylaxis: not indicated GI prophylaxis: PPI Lines: Central line and No longer needed.  Order written to d/c  Foley:  N/A Code Status:  full code Last date of multidisciplinary goals of care discussion [N/A]  Labs   CBC: Recent Labs  Lab 03/08/24 1829 03/09/24 0543 03/10/24 0452 03/10/24 1032 03/11/24 0624 03/12/24 0631 03/13/24 0602  WBC 12.6*   < > 9.0 9.3 7.5 7.2 9.5  NEUTROABS 10.1*  --   --  6.8  --   --   --   HGB 14.6   < > 11.7* 12.3* 11.9* 13.2 13.6  HCT 44.4   < > 35.8* 37.5* 36.2* 41.1 42.1  MCV 81.3   < > 81.7 83.1 82.1 81.9 82.5  PLT 230   < > 229 224 208 239 241   < > = values in this interval not displayed.    Basic Metabolic Panel: Recent Labs  Lab 03/08/24 1829 03/09/24 1330 03/10/24 0452 03/10/24 1300 03/11/24 0600 03/11/24 4098  03/11/24 1202 03/12/24 0631 03/12/24 1325 03/12/24 1809 03/12/24 2345 03/13/24 0602  NA 141   < > 148*   < >  --  146*   < > 143 143 141 139 139  K 3.8  --  3.2*  --   --  2.9*  --  3.7  --   --   --  3.3*  CL 109  --  122*  --   --  120*  --  117*  --   --   --  117*  CO2 18*  --  17*  --   --  18*  --  18*  --   --   --  15*  GLUCOSE 120*  --  118*  --   --  111*  --  111*  --   --   --  112*  BUN 8  --  8  --   --  5*  --  <5*  --   --   --  6  CREATININE 1.09  --  0.98  --   --  0.82  --  0.76  --   --   --  0.95  CALCIUM  9.3  --  8.8*  --   --  8.5*  --  9.2  --   --   --  9.5  MG  --   --  1.8  --  1.9  --   --  2.2  --   --    --   --   PHOS  --   --   --   --   --   --   --  3.3  --   --   --   --    < > = values in this interval not displayed.   GFR: Estimated Creatinine Clearance: 91.7 mL/min (by C-G formula based on SCr of 0.95 mg/dL). Recent Labs  Lab 03/08/24 2117 03/09/24 0543 03/10/24 1032 03/11/24 0624 03/12/24 0631 03/13/24 0602  PROCALCITON <0.10  --   --   --   --   --   WBC  --    < > 9.3 7.5 7.2 9.5   < > = values in this interval not displayed.    Liver Function Tests: Recent Labs  Lab 03/08/24 1829 03/10/24 0452  AST 25 25  ALT 42 41  ALKPHOS 66 52  BILITOT 0.8 0.5  PROT 7.3 6.1*  ALBUMIN 4.2 3.4*   No results for input(s): "LIPASE", "AMYLASE" in the last 168 hours. Recent Labs  Lab 03/09/24 0947  AMMONIA 26    ABG    Component Value Date/Time   PHART 7.44 03/09/2024 1250   PCO2ART 26 (L) 03/09/2024 1250   PO2ART 100 03/09/2024 1250   HCO3 17.7 (L) 03/09/2024 1250   TCO2 17 (L) 03/08/2024 1403   ACIDBASEDEF 4.8 (H) 03/09/2024 1250   O2SAT 99.5 03/09/2024 1250     Coagulation Profile: Recent Labs  Lab 03/08/24 1829  INR 1.4*    Cardiac Enzymes: Recent Labs  Lab 03/08/24 1829  CKTOTAL 262    HbA1C: Hgb A1c MFr Bld  Date/Time Value Ref Range Status  01/26/2024 08:47 PM 5.7 (H) 4.8 - 5.6 % Final    Comment:    (NOTE) Pre diabetes:          5.7%-6.4%  Diabetes:              >6.4%  Glycemic control for   <  7.0% adults with diabetes     CBG: Recent Labs  Lab 03/12/24 0727 03/12/24 1128 03/12/24 1547 03/12/24 2216 03/13/24 0753  GLUCAP 115* 122* 147* 122* 126*    Critical care time: n/a     Samuel German, MD  Pulmonary & Critical Care Office: [phone]   See Amion for personal pager PCCM on call pager [phone] until 7pm. Please call Elink 7p-7a. [phone]

## 2024-03-13 NOTE — Progress Notes (Signed)
 STROKE TEAM PROGRESS NOTE   INTERIM HISTORY/SUBJECTIVE Taper 3%. Still having headaches, will order diamox  to see if this helps with headaches.   OBJECTIVE  CBC    Component Value Date/Time   WBC 9.5 03/13/2024 0602   RBC 5.10 03/13/2024 0602   HGB 13.6 03/13/2024 0602   HCT 42.1 03/13/2024 0602   PLT 241 03/13/2024 0602   MCV 82.5 03/13/2024 0602   MCH 26.7 03/13/2024 0602   MCHC 32.3 03/13/2024 0602   RDW 12.6 03/13/2024 0602   LYMPHSABS 1.5 03/10/2024 1032   MONOABS 1.0 03/10/2024 1032   EOSABS 0.0 03/10/2024 1032   BASOSABS 0.0 03/10/2024 1032    BMET    Component Value Date/Time   NA 139 03/13/2024 0602   K 3.3 (L) 03/13/2024 0602   CL 117 (H) 03/13/2024 0602   CO2 15 (L) 03/13/2024 0602   GLUCOSE 112 (H) 03/13/2024 0602   BUN 6 03/13/2024 0602   CREATININE 0.95 03/13/2024 0602   CALCIUM  9.5 03/13/2024 0602   GFRNONAA >60 03/13/2024 0602    IMAGING past 24 hours No results found.   Vitals:   03/13/24 0700 03/13/24 0754 03/13/24 0800 03/13/24 1111  BP: 112/80  106/74   Pulse: 60  (!) 54   Resp: (!) 27  13   Temp:  98.1 F (36.7 C)  (!) 96.2 F (35.7 C)  TempSrc:  Oral  Axillary  SpO2: 97%  97%   Weight:      Height:         PHYSICAL EXAM General:  Alert, well-nourished, well-developed patient in no acute distress Psych:  Mood and affect appropriate for situation CV: Regular rate and rhythm on monitor Respiratory:  Regular, unlabored respirations on room air GI: Abdomen soft and nontender NEURO:  Pt is awake, alert, eyes open, smile to provider. No gaze palsy, tracking bilaterally, blinking to visual threat bilaterally, PERRL. No facial droop. Tongue midline. Bilateral UEs 5/5, no drift. Bilaterally LEs 5/5, no drift. b/l FTN intact, gait not tested.     ASSESSMENT/PLAN  [name] is a 33 y.o. male with history of hx of deafness, stroke/extensive venous thrombosis on Eliquis  (discharged 02/10/2024), prior history of meningoencephalitis,  hyperlipidemia and GERD. CT head with no acute process. 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 CTA and CTV obtained. Heparin  gtt initiated. NIH on Admission 25  Worsening mental status, improved Patient seems more drowsy sleepy, hard to arouse on 4/29 CT repeat 4/29 showed increased cerebral edema Transferred to ICU for 3% saline with bolus S/p IR but not successful with IR Dr. Alvira Warren Mental status much improved, back to basel ine now Tape down 3% saline  On LTM EEG - no seizure -> d/c  Persistent dural venous sinus thrombus:   Code Stroke CT head -  Unchanged small subacute left frontal lobe infarct in the setting of known dural venous sinus thrombosis. ASPECTS of 10. CTA head & neck No LVO CT Venogram Mild progression of extensive thrombus in the superior sagittal sinus posteriorly. Mild improvement of subocclusive thrombus in the  transverse and sigmoid sinuses bilaterally. Persistent bilateral cavernous sinus thrombosis. MRI  Evolving subacute venous ischemic changes involving the paramedian frontal lobes and likely the left occipital lobe, not significantly changed as compared to previous. Mild associated petechial hemorrhage at the posterior left frontal region, stable from prior.  4/29- Stat Head CT generalized brain swelling.  On personal review, seems more cerebral edema  than 4/28 3/18- 2D Echo - EF 45-50% in 01/2024 EEG- negative for seizure, mild to moderate diffuse encephalopathy LDL 58 HgbA1c 5.7 VTE prophylaxis - Heparin  IV Eliquis  (apixaban ) daily prior to admission, now on eliquis   Therapy recommendations:  CIR Disposition:  pending  Cerebral edema Transferred to ICU on 4/29 due to decreased LOC Hypertonic saline started at 29ml/hr->40cc -> 20cc for 12 hours and then d/c goal sodium 150-155 Serial neurochecks Q6 Na Na 144 --141--136-146-148-149-146 Add Na tab 2g tid Headaches- likely due to cerebral  edema - topamax  stopped 5/1 due to low bicarb.  Put on Diamox  500mg  bid x2 and BMP tomorrow if still low bicarb will need to consider alternatives  Hx of CVST Admitted 02/01/2024 for CVST.  Status post IR for thrombectomy and discharged on Eliquis . Readmitted 03/02/2024 for right-sided weakness.  CT head showed increased left frontal venous infarct.  CTV showed recurrent occlusive and subocclusive thrombi within the SSS posteriorly, extending to the confluence of sinus.  Thrombus extending into the medial aspect of the right transverse sinus.  Clot burden within the right sigmoid sinus has decreased.  Patient was concerning for dehydration at the time, and received fluid resuscitation.  Discharged in good condition However, per wife, patient discharged on 4/25 Friday, at the night of that day, patient drowsy sleepy not talking.  Saturday some improvement but not eating and drinking well able to take medication.  Sunday she did a poorly, drowsy sleepy not talking not eating, however wife was able to get medication down.  Monday yesterday, he was sent to ER for evaluation.  Hx of Stroke Recent viral meningitis January 27, 2024 right frontal 2-3 punctate infarcts, etiology: Likely due to infectious vasculitis vs. Small vessel disease in the setting of CNS infection  CSF at that time WBC 22, RBC 1330, protein 91, glucose 80, concerning for viral meningitis Completed course of Doxy No need LP repeat at this time given no sign of CNS infection and especially with cerebral edema and low-lying cerebellar tonsils extending up to 9 mm below the foreman magnum  Hyperlipidemia Home meds:  Atorvastatin  80, resumed LDL in 01/2024 279 This admission LDL 58, goal < 70 Continue statin on discharge  Dysphagia SLP consulted On dys 3 and thin liquid Advance diet as tolerated  Other Active Problems Hypokalemia K 3.2 - 2.9 - supplement -> 3.7  Hospital day # 5     There is a lady family member at the bedside.  Pt lying in bed, sleepy complaining of headache tracking, with body language and pain chart,  . Moving all extremities, no gaze palsy or neglect.  Plan to dc daimox due to hyperchloremia. IV hydration and start depacon for headache. Switch eliquis  to heparin  bridge and warafrin. Hopefully transfer to floor and rehab in few days. D/w Dr Diania Fortes PCCM MD   This patient is critically ill and at significant risk of neurological worsening, death and care requires constant monitoring of vital signs, hemodynamics,respiratory and cardiac monitoring, extensive review of multiple databases, frequent neurological assessment, discussion with family, other specialists and medical decision making of high complexity.I have made any additions or clarifications directly to the above note.This critical care time does not reflect procedure time, or teaching time or supervisory time of PA/NP/Med Resident etc but could involve care discussion time.  I spent 30 minutes of neurocritical care time  in the care of  this patient.    Ardella Beaver, MD  To contact Stroke Continuity provider, please refer to [email or web address].  After hours, contact General Neurology

## 2024-03-13 NOTE — Progress Notes (Signed)
 PHARMACY - ANTICOAGULATION CONSULT NOTE  Pharmacy Consult:  Eliquis  >> Heparin  / Coumadin Indication: CVTS  No Known Allergies  Patient Measurements: Height: 5\' 4"  (162.6 cm) Weight: 65 kg (143 lb 4.8 oz) IBW/kg (Calculated) : 59.2 HEPARIN  DW (KG): 65  Vital Signs: Temp: 98.1 F (36.7 C) (05/03 0754) Temp Source: Oral (05/03 0754) BP: 106/74 (05/03 0800) Pulse Rate: 54 (05/03 0800)  Labs: Recent Labs    03/10/24 1745 03/11/24 0042 03/11/24 1610 03/11/24 9604 03/11/24 0624 03/12/24 0631 03/13/24 0602  HGB  --   --   --  11.9*   < > 13.2 13.6  HCT  --   --   --  36.2*  --  41.1 42.1  PLT  --   --   --  208  --  239 241  APTT 57* 64*  --   --   --   --   --   HEPARINUNFRC  --  0.72*  --   --   --   --   --   CREATININE  --   --  0.82  --   --  0.76 0.95   < > = values in this interval not displayed.    Estimated Creatinine Clearance: 91.7 mL/min (by C-G formula based on SCr of 0.95 mg/dL).   Assessment: 77 YOM with history of sinus thrombosis on Eliquis  PTA. He returned with new onset of R-sided weakness and cerebral edema.  Patient was initially on IV heparin , then transitioned back to PTA Eliquis .  Pharmacy consulted to transition patient to IV heparin  bridge to Coumadin.    Last Eliquis  dose was on 5/2 at 2118. Patient was previously on IV heparin  at 950 units/hr. INR was 1.4 on admission and could be falsely elevated now due to Eliquis . CBC stable; no bleeding reported. Will use aPTT to guide heparin  dosing until heparin  level and PTT correlate. Eating ~25% of meals. Noted DDI with VPA.  Goal of Therapy:  INR 2-3 Heparin  level 0.3-0.5 units/ml aPTT 66-85 seconds Monitor platelets by anticoagulation protocol: Yes   Plan:   No bolus, start IV heparin  at 950 units/hr Coumadin 7.5mg  PO today Check 6 hr aPTT Daily heparin  level, aPTT, PT / INR and CBC  Graviel Payeur D. Marikay Show, PharmD, BCPS, BCCCP 03/13/2024, 10:54 AM

## 2024-03-13 NOTE — Progress Notes (Signed)
 PHARMACY - ANTICOAGULATION CONSULT NOTE  Pharmacy Consult:  Eliquis  >> Heparin  / Coumadin Indication: CVTS  No Known Allergies  Patient Measurements: Height: 5\' 4"  (162.6 cm) Weight: 65 kg (143 lb 4.8 oz) IBW/kg (Calculated) : 59.2 HEPARIN  DW (KG): 65  Vital Signs: Temp: 98.3 F (36.8 C) (05/03 2002) Temp Source: Oral (05/03 2002) BP: 108/74 (05/03 1800) Pulse Rate: 62 (05/03 1800)  Labs: Recent Labs    03/11/24 0042 03/11/24 1610 03/11/24 9604 03/11/24 0624 03/12/24 0631 03/13/24 0602 03/13/24 1753  HGB  --   --  11.9*   < > 13.2 13.6  --   HCT  --   --  36.2*  --  41.1 42.1  --   PLT  --   --  208  --  239 241  --   APTT 64*  --   --   --   --   --  53*  LABPROT  --   --   --   --   --   --  15.2  INR  --   --   --   --   --   --  1.2  HEPARINUNFRC 0.72*  --   --   --   --   --   --   CREATININE  --  0.82  --   --  0.76 0.95  --    < > = values in this interval not displayed.    Estimated Creatinine Clearance: 91.7 mL/min (by C-G formula based on SCr of 0.95 mg/dL).   Assessment: 46 YOM with history of sinus thrombosis on Eliquis  PTA. He returned with new onset of R-sided weakness and cerebral edema.  Patient was initially on IV heparin , then transitioned back to PTA Eliquis .  Pharmacy consulted to transition patient to IV heparin  bridge to Coumadin.    Last Eliquis  dose was on 5/2 at 2118. Patient was previously on IV heparin  at 950 units/hr. INR was 1.4 on admission and could be falsely elevated now due to Eliquis . CBC stable; no bleeding reported. Will use aPTT to guide heparin  dosing until heparin  level and PTT correlate. Eating ~25% of meals. Noted DDI with VPA.  5/3 PM: aPTT 53 below goal on 950 units/hr, no issues with infusion or overt s/sx of bleeding reported.   Goal of Therapy:  INR 2-3 Heparin  level 0.3-0.5 units/ml aPTT 66-85 seconds Monitor platelets by anticoagulation protocol: Yes   Plan:   Increase IV heparin  to 1050  units/hr Coumadin 7.5mg  PO today Check 6 hr aPTT Daily heparin  level, aPTT, PT / INR and CBC  Mohammed Andrew, PharmD Clinical Pharmacist 03/13/2024 8:59 PM Please check AMION for all [facility] numbers

## 2024-03-13 NOTE — Progress Notes (Addendum)
 Patient refusing multiple oral meds today because of his headache.  Attempted to educate on importance of medications but with barriers in communication very difficult.  Mom has also attempted to help get him to take without success.    Seems Headaches are worse this afternoon compared to yesterday.  Has only eaten a couple bites today.  Has attempted to sleep all day and refusing to reposition.

## 2024-03-13 NOTE — Progress Notes (Signed)
 There was a consult for placing a PIV access due to D/C CVC. Assessed with US  on Lt. Lower arm- very small veins, only cephalic vein is suitable, but one PIV access on cephalic vein at [facility] area as well as non compressive vein at [facility] area. Informed patient's RN regarding this finding. Rt. Side was weakness when patient was admitted. MD was okay to put in the IV access on Rt. Side. Assessed Rt. Arm with US  - very small veins. Informed patient's RN regarding this matter. HS [employer]

## 2024-03-14 DIAGNOSIS — R1310 Dysphagia, unspecified: Secondary | ICD-10-CM | POA: Diagnosis not present

## 2024-03-14 DIAGNOSIS — G936 Cerebral edema: Secondary | ICD-10-CM | POA: Diagnosis not present

## 2024-03-14 DIAGNOSIS — E785 Hyperlipidemia, unspecified: Secondary | ICD-10-CM | POA: Diagnosis not present

## 2024-03-14 DIAGNOSIS — G08 Intracranial and intraspinal phlebitis and thrombophlebitis: Secondary | ICD-10-CM | POA: Diagnosis not present

## 2024-03-14 LAB — GLUCOSE, CAPILLARY
Glucose-Capillary: 102 mg/dL — ABNORMAL HIGH (ref 70–99)
Glucose-Capillary: 111 mg/dL — ABNORMAL HIGH (ref 70–99)
Glucose-Capillary: 118 mg/dL — ABNORMAL HIGH (ref 70–99)
Glucose-Capillary: 129 mg/dL — ABNORMAL HIGH (ref 70–99)

## 2024-03-14 LAB — BASIC METABOLIC PANEL WITH GFR
Anion gap: 8 (ref 5–15)
BUN: 9 mg/dL (ref 6–20)
CO2: 16 mmol/L — ABNORMAL LOW (ref 22–32)
Calcium: 9.1 mg/dL (ref 8.9–10.3)
Chloride: 114 mmol/L — ABNORMAL HIGH (ref 98–111)
Creatinine, Ser: 1.01 mg/dL (ref 0.61–1.24)
GFR, Estimated: >60 mL/min (ref >60)
Glucose, Bld: 133 mg/dL — ABNORMAL HIGH (ref 70–99)
Potassium: 3.4 mmol/L — ABNORMAL LOW (ref 3.5–5.1)
Sodium: 138 mmol/L (ref 135–145)

## 2024-03-14 LAB — APTT: aPTT: 80 s — ABNORMAL HIGH (ref 24–36)

## 2024-03-14 LAB — CBC
HCT: 40.8 % (ref 39.0–52.0)
Hemoglobin: 13.2 g/dL (ref 13.0–17.0)
MCH: 26.5 pg (ref 26.0–34.0)
MCHC: 32.4 g/dL (ref 30.0–36.0)
MCV: 81.9 fL (ref 80.0–100.0)
Platelets: 248 10*3/uL (ref 150–400)
RBC: 4.98 MIL/uL (ref 4.22–5.81)
RDW: 12.4 % (ref 11.5–15.5)
WBC: 9 10*3/uL (ref 4.0–10.5)
nRBC: 0 % (ref 0.0–0.2)

## 2024-03-14 LAB — PROTIME-INR
INR: 1.2 (ref 0.8–1.2)
Prothrombin Time: 15 s (ref 11.4–15.2)

## 2024-03-14 LAB — HEPARIN LEVEL (UNFRACTIONATED): Heparin Unfractionated: >1.1 [IU]/mL — ABNORMAL HIGH (ref 0.30–0.70)

## 2024-03-14 MED ORDER — METHYLPREDNISOLONE 4 MG PO TBPK: 4.0000 mg | ORAL_TABLET | ORAL | Status: DC

## 2024-03-14 MED ORDER — METHYLPREDNISOLONE 4 MG PO TBPK
8.0000 mg | ORAL_TABLET | Freq: Every evening | ORAL | Status: AC
  Administered 2024-03-15: 8 mg via ORAL

## 2024-03-14 MED ORDER — POTASSIUM CHLORIDE 10 MEQ/50ML IV SOLN
10.0000 meq | INTRAVENOUS | Status: AC
  Administered 2024-03-14 (×6): 10 meq via INTRAVENOUS
  Filled 2024-03-14 (×6): qty 50

## 2024-03-14 MED ORDER — METHYLPREDNISOLONE 4 MG PO TBPK: 8.0000 mg | ORAL_TABLET | Freq: Every evening | ORAL | Status: DC

## 2024-03-14 MED ORDER — POLYETHYLENE GLYCOL 3350 17 G PO PACK
17.0000 g | PACK | Freq: Two times a day (BID) | ORAL | Status: DC
  Administered 2024-03-15 – 2024-03-17 (×5): 17 g via ORAL
  Filled 2024-03-14 (×5): qty 1

## 2024-03-14 MED ORDER — WARFARIN SODIUM 7.5 MG PO TABS
7.5000 mg | ORAL_TABLET | Freq: Once | ORAL | Status: AC
  Administered 2024-03-14: 7.5 mg via ORAL
  Filled 2024-03-14: qty 1

## 2024-03-14 MED ORDER — POTASSIUM CHLORIDE CRYS ER 20 MEQ PO TBCR
40.0000 meq | EXTENDED_RELEASE_TABLET | Freq: Once | ORAL | Status: DC
  Filled 2024-03-14: qty 2

## 2024-03-14 MED ORDER — METHYLPREDNISOLONE 4 MG PO TBPK
4.0000 mg | ORAL_TABLET | Freq: Three times a day (TID) | ORAL | Status: AC
  Administered 2024-03-15 (×3): 4 mg via ORAL

## 2024-03-14 MED ORDER — METHYLPREDNISOLONE 4 MG PO TBPK
4.0000 mg | ORAL_TABLET | Freq: Four times a day (QID) | ORAL | Status: DC
  Administered 2024-03-16 – 2024-03-18 (×8): 4 mg via ORAL

## 2024-03-14 MED ORDER — METHYLPREDNISOLONE 4 MG PO TBPK: 8.0000 mg | ORAL_TABLET | Freq: Every morning | ORAL | Status: DC

## 2024-03-14 MED ORDER — METHYLPREDNISOLONE 4 MG PO TBPK
4.0000 mg | ORAL_TABLET | ORAL | Status: AC
  Administered 2024-03-14: 4 mg via ORAL

## 2024-03-14 MED ORDER — METHYLPREDNISOLONE 4 MG PO TBPK: 4.0000 mg | ORAL_TABLET | Freq: Four times a day (QID) | ORAL | Status: DC

## 2024-03-14 MED ORDER — PEG 3350-KCL-NA BICARB-NACL 420 G PO SOLR
4000.0000 mL | Freq: Once | ORAL | Status: AC
  Administered 2024-03-14: 4000 mL via ORAL
  Filled 2024-03-14: qty 4000

## 2024-03-14 MED ORDER — METHYLPREDNISOLONE 4 MG PO TBPK: 4.0000 mg | ORAL_TABLET | Freq: Three times a day (TID) | ORAL | Status: DC

## 2024-03-14 MED ORDER — METHYLPREDNISOLONE 4 MG PO TBPK
8.0000 mg | ORAL_TABLET | Freq: Every morning | ORAL | Status: AC
  Administered 2024-03-14: 8 mg via ORAL
  Filled 2024-03-14: qty 21

## 2024-03-14 NOTE — Plan of Care (Signed)
  Problem: Education: Goal: Knowledge of General Education information will improve Description: Including pain rating scale, medication(s)/side effects and non-pharmacologic comfort measures Outcome: Not Progressing   Problem: Health Behavior/Discharge Planning: Goal: Ability to manage health-related needs will improve Outcome: Not Progressing   Problem: Clinical Measurements: Goal: Ability to maintain clinical measurements within normal limits will improve Outcome: Not Progressing Goal: Will remain free from infection Outcome: Not Progressing Goal: Diagnostic test results will improve Outcome: Not Progressing Goal: Respiratory complications will improve Outcome: Not Progressing Goal: Cardiovascular complication will be avoided Outcome: Not Progressing   Problem: Activity: Goal: Risk for activity intolerance will decrease Outcome: Not Progressing   Problem: Nutrition: Goal: Adequate nutrition will be maintained Outcome: Not Progressing   Problem: Coping: Goal: Level of anxiety will decrease Outcome: Not Progressing   Problem: Elimination: Goal: Will not experience complications related to bowel motility Outcome: Not Progressing Goal: Will not experience complications related to urinary retention Outcome: Not Progressing   Problem: Pain Managment: Goal: General experience of comfort will improve and/or be controlled Outcome: Not Progressing   Problem: Safety: Goal: Ability to remain free from injury will improve Outcome: Not Progressing   Problem: Skin Integrity: Goal: Risk for impaired skin integrity will decrease Outcome: Not Progressing   Problem: Education: Goal: Understanding of CV disease, CV risk reduction, and recovery process will improve Outcome: Not Progressing Goal: Individualized Educational Video(s) Outcome: Not Progressing   Problem: Activity: Goal: Ability to return to baseline activity level will improve Outcome: Not Progressing   Problem:  Cardiovascular: Goal: Ability to achieve and maintain adequate cardiovascular perfusion will improve Outcome: Not Progressing Goal: Vascular access site(s) Level 0-1 will be maintained Outcome: Not Progressing   Problem: Health Behavior/Discharge Planning: Goal: Ability to safely manage health-related needs after discharge will improve Outcome: Not Progressing

## 2024-03-14 NOTE — Progress Notes (Signed)
 [facility] ADULT ICU REPLACEMENT PROTOCOL   The patient does apply for the [facility] Adult ICU Electrolyte Replacment Protocol based on the criteria listed below:   1.Exclusion criteria: TCTS, ECMO, Dialysis, and Myasthenia Gravis patients 2. Is GFR >/= 30 ml/min? Yes.    Patient's GFR today is >60 3. Is SCr </= 2? Yes.   Patient's SCr is 1.01 mg/dL 4. Did SCr increase >/= 0.5 in 24 hours? No. 5.Pt's weight >40kg  Yes.   6. Abnormal electrolyte(s): k+ 3.4  7. Electrolytes replaced per protocol 8.  Call MD STAT for K+ </= 2.5, Phos </= 1, or Mag </= 1 Physician: Dr. Silva Drone, Doloris Freund 03/14/2024 5:48 AM

## 2024-03-14 NOTE — Progress Notes (Signed)
 STROKE TEAM PROGRESS NOTE   INTERIM HISTORY/SUBJECTIVE His wife is at the bedside.  [place] language with no interpreter was used.  Patient is still having headaches, and scores 10/10 in severity but appears quite comfortable.  He was started on IV Depacon yesterday.  Plan to add Medrol Dosepak today.  Transfer out of ICU to the floor later if bed available  OBJECTIVE  CBC    Component Value Date/Time   WBC 9.0 03/14/2024 0415   RBC 4.98 03/14/2024 0415   HGB 13.2 03/14/2024 0415   HCT 40.8 03/14/2024 0415   PLT 248 03/14/2024 0415   MCV 81.9 03/14/2024 0415   MCH 26.5 03/14/2024 0415   MCHC 32.4 03/14/2024 0415   RDW 12.4 03/14/2024 0415   LYMPHSABS 1.5 03/10/2024 1032   MONOABS 1.0 03/10/2024 1032   EOSABS 0.0 03/10/2024 1032   BASOSABS 0.0 03/10/2024 1032    BMET    Component Value Date/Time   NA 138 03/14/2024 0415   K 3.4 (L) 03/14/2024 0415   CL 114 (H) 03/14/2024 0415   CO2 16 (L) 03/14/2024 0415   GLUCOSE 133 (H) 03/14/2024 0415   BUN 9 03/14/2024 0415   CREATININE 1.01 03/14/2024 0415   CALCIUM  9.1 03/14/2024 0415   GFRNONAA >60 03/14/2024 0415    IMAGING past 24 hours No results found.   Vitals:   03/14/24 0749 03/14/24 1000 03/14/24 1100 03/14/24 1123  BP:  121/84 105/82   Pulse:  63 71   Resp:  15 15   Temp: 98.3 F (36.8 C)   98.3 F (36.8 C)  TempSrc: Oral   Oral  SpO2:  95% 94%   Weight:      Height:         PHYSICAL EXAM General:  Alert, well-nourished, well-developed patient in no acute distress Psych:  Mood and affect appropriate for situation CV: Regular rate and rhythm on monitor Respiratory:  Regular, unlabored respirations on room air GI: Abdomen soft and nontender NEURO:  Pt is awake, alert, eyes open, smile to provider. No gaze palsy, tracking bilaterally, blinking to visual threat bilaterally, PERRL. No facial droop. Tongue midline. Bilateral UEs 5/5, no drift. Bilaterally LEs 5/5, no drift. b/l FTN intact, gait not tested.      ASSESSMENT/PLAN  Samuel Warren is a 33 y.o. 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 CT head with no acute process. 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 CTA and CTV obtained. Heparin  gtt initiated. NIH on Admission 25  Worsening mental status, improved Patient seems more drowsy sleepy, hard to arouse on 4/29 CT repeat 4/29 showed increased cerebral edema Transferred to ICU for 3% saline with bolus S/p IR but not successful with IR Dr. Alvira Josephs Mental status much improved, back to basel ine now Tape down 3% saline  On LTM EEG - no seizure -> d/c  Persistent dural venous sinus thrombus:   Code Stroke CT head -  Unchanged small subacute left frontal lobe infarct in the setting of known dural venous sinus thrombosis. ASPECTS of 10. CTA head & neck No LVO CT Venogram Mild progression of extensive thrombus in the superior sagittal sinus posteriorly. Mild improvement of subocclusive thrombus in the  transverse and sigmoid sinuses bilaterally. Persistent bilateral cavernous sinus thrombosis. MRI  Evolving subacute venous ischemic changes involving the paramedian frontal lobes and likely the left occipital  lobe, not significantly changed as compared to previous. Mild associated petechial hemorrhage at the posterior left frontal region, stable from prior.  4/29- Stat Head CT generalized brain swelling.  On personal review, seems more cerebral edema than 4/28 3/18- 2D Echo - EF 45-50% in 01/2024 EEG- negative for seizure, mild to moderate diffuse encephalopathy LDL 58 HgbA1c 5.7 VTE prophylaxis - Heparin  IV Eliquis  (apixaban ) daily prior to admission, now on eliquis   Therapy recommendations:  CIR Disposition:  pending  Cerebral edema Transferred to ICU on 4/29 due to decreased LOC Hypertonic  saline started at 77ml/hr->40cc -> 20cc for 12 hours and then d/c goal sodium 150-155 Serial neurochecks Q6 Na Na 144 --141--[id number] Add Na tab 2g tid Headaches- likely due to cerebral edema - topamax  stopped 5/1 due to low bicarb.  Put on Diamox  500mg  bid x2 and BMP tomorrow if still low bicarb will need to consider alternatives  Hx of CVST Admitted 02/01/2024 for CVST.  Status post IR for thrombectomy and discharged on Eliquis . Readmitted 03/02/2024 for right-sided weakness.  CT head showed increased left frontal venous infarct.  CTV showed recurrent occlusive and subocclusive thrombi within the SSS posteriorly, extending to the confluence of sinus.  Thrombus extending into the medial aspect of the right transverse sinus.  Clot burden within the right sigmoid sinus has decreased.  Patient was concerning for dehydration at the time, and received fluid resuscitation.  Discharged in good condition However, per wife, patient discharged on 4/25 Friday, at the night of that day, patient drowsy sleepy not talking.  Saturday some improvement but not eating and drinking well able to take medication.  Sunday she did a poorly, drowsy sleepy not talking not eating, however wife was able to get medication down.  Monday yesterday, he was sent to ER for evaluation.  Hx of Stroke Recent viral meningitis January 27, 2024 right frontal 2-3 punctate infarcts, etiology: Likely due to infectious vasculitis vs. Small vessel disease in the setting of CNS infection  CSF at that time WBC 22, RBC 1330, protein 91, glucose 80, concerning for viral meningitis Completed course of Doxy No need LP repeat at this time given no sign of CNS infection and especially with cerebral edema and low-lying cerebellar tonsils extending up to 9 mm below the foreman magnum  Hyperlipidemia Home meds:  Atorvastatin  80, resumed LDL in 01/2024 279 This admission LDL 58, goal < 70 Continue statin on discharge  Dysphagia SLP  consulted On dys 3 and thin liquid Advance diet as tolerated  Other Active Problems Hypokalemia K 3.2 - 2.9 - supplement -> 3.7  Hospital day # 6     His wife is at the bedside.. Pt lying in bed, sleepy complaining of headache tracking, with body language and pain chart,  . Moving all extremities, no gaze palsy or neglect.  Plan to add Medrol Dosepak for refractory headache..  Can.  IV hydration and  depacon for headache.  Have switched eliquis  to heparin  bridge and warafrin. Hopefully transfer to floor and rehab in few days. D/w Dr Diania Fortes PCCM MD and Dr. Rudine Cos.  Greater than 50% time during this 50-minute visit was spent in counseling and coordination of care about his refractory headache and cerebral venous sinus thrombosis and discussion with patient and wife and care team and answering questions.   Ardella Beaver, MD  To contact Stroke Continuity provider, please refer to [email or web address]. After hours, contact General Neurology

## 2024-03-14 NOTE — Progress Notes (Signed)
 PROGRESS NOTE    Samuel Warren  UJW:[id number] DOB: 07-07-1991 DOA: 03/08/2024 PCP: Alto Atta, NP   Brief Narrative:  Samuel Warren is a 33 y.o. male with medical history significant of deafness, venous sinus thrombosis on eliquis , and meningoencephalitis presented to ED on 4/28 as a code stroke after being found down w/ new onset R sided weakness.  Patient has known cardiovascular disease with multiple CVA and thrombectomies.   Assessment & Plan:   Principal Problem:   Dural venous sinus thrombosis Active Problems:   Acute cerebral venous sinus thrombosis   Right hemiplegia (HCC)  4/28 admitted, code stroke called on him 4/29 Radiology with anesthesia - IR thrombectomy veno mesh-  Unsuccessful   4/30 Continuous EEG unremarkable 5/2 hypertonic saline discontinued at 11pm 5/4 transition to hospitalist medicine team   Chronic dural sinus thrombosis Cerebral edema Acute Encephalopathy Acute intractable headaches - S/p attempt thrombectomy 4/29 without success - transitioning from heparin  gtt to warfarin - Neurology is following  - Hypertonic saline discontinued 5/2, given diamox  5/2 - 2g sodium tabs - Medrol dose pack started 5/4 - Depakote IV ongoing   Hypokalemia - replete as necessary (patient refusing PO - transition to IV)   Metabolic Acidosis - low serum bicarb in setting of diamox  adinistration - Continue IV fluids with normal saline   DVT prophylaxis: warfarin (COUMADIN) tablet 7.5 mg heparin  drip Code Status:   Code Status: Full Code Family Communication: Wife at bedside, [place] translator used  Status is: Inpatient  Dispo: The patient is from: Home              Anticipated d/c is to: To be determined              Anticipated d/c date is: To be determined              Patient currently not medically stable for discharge  Consultants:  PCCM, neurology, interventional radiology  Antimicrobials:  None  Subjective: No acute issues or events  overnight, review of systems markedly limited by patient's inability to communicate  Objective: Vitals:   03/13/24 1700 03/13/24 1800 03/13/24 2002 03/14/24 0334  BP: 112/82 108/74    Pulse: 70 62    Resp: 18 16    Temp:   98.3 F (36.8 C) 98.2 F (36.8 C)  TempSrc:   Oral Oral  SpO2: 97% 98%    Weight:      Height:        Intake/Output Summary (Last 24 hours) at 03/14/2024 0735 Last data filed at 03/13/2024 1700 Gross per 24 hour  Intake 743.68 ml  Output 1300 ml  Net -556.32 ml   Filed Weights   03/08/24 1400  Weight: 65 kg    Examination:  General:  Pleasantly resting in bed, No acute distress.  Poorly interactive but does not appear in pain HEENT:  Normocephalic atraumatic.  Sclerae nonicteric, noninjected.  Extraocular movements intact bilaterally. Neck:  Without mass or deformity.  Trachea is midline. Lungs:  Clear to auscultate bilaterally without rhonchi, wheeze, or rales. Heart:  Regular rate and rhythm.  Without murmurs, rubs, or gallops. Abdomen:  Soft, nontender, nondistended.  Without guarding or rebound. Extremities: Without cyanosis, clubbing, edema, or obvious deformity.  Data Reviewed: I have personally reviewed following labs and imaging studies  CBC: Recent Labs  Lab 03/08/24 1829 03/09/24 0543 03/10/24 1032 03/11/24 0624 03/12/24 0631 03/13/24 0602 03/14/24 0415  WBC 12.6*   < > 9.3 7.5 7.2 9.5 9.0  NEUTROABS 10.1*  --  6.8  --   --   --   --   HGB 14.6   < > 12.3* 11.9* 13.2 13.6 13.2  HCT 44.4   < > 37.5* 36.2* 41.1 42.1 40.8  MCV 81.3   < > 83.1 82.1 81.9 82.5 81.9  PLT 230   < > 224 208 239 241 248   < > = values in this interval not displayed.   Basic Metabolic Panel: Recent Labs  Lab 03/10/24 0452 03/10/24 1300 03/11/24 0600 03/11/24 0621 03/11/24 1202 03/12/24 0631 03/12/24 1325 03/12/24 1809 03/12/24 2345 03/13/24 0602 03/14/24 0415  NA 148*   < >  --  146*   < > 143 143 141 139 139 138  K 3.2*  --   --  2.9*  --  3.7   --   --   --  3.3* 3.4*  CL 122*  --   --  120*  --  117*  --   --   --  117* 114*  CO2 17*  --   --  18*  --  18*  --   --   --  15* 16*  GLUCOSE 118*  --   --  111*  --  111*  --   --   --  112* 133*  BUN 8  --   --  5*  --  <5*  --   --   --  6 9  CREATININE 0.98  --   --  0.82  --  0.76  --   --   --  0.95 1.01  CALCIUM  8.8*  --   --  8.5*  --  9.2  --   --   --  9.5 9.1  MG 1.8  --  1.9  --   --  2.2  --   --   --   --   --   PHOS  --   --   --   --   --  3.3  --   --   --   --   --    < > = values in this interval not displayed.   GFR: Estimated Creatinine Clearance: 86.3 mL/min (by C-G formula based on SCr of 1.01 mg/dL). Liver Function Tests: Recent Labs  Lab 03/08/24 1829 03/10/24 0452  AST 25 25  ALT 42 41  ALKPHOS 66 52  BILITOT 0.8 0.5  PROT 7.3 6.1*  ALBUMIN 4.2 3.4*   No results for input(s): "LIPASE", "AMYLASE" in the last 168 hours. Recent Labs  Lab 03/09/24 0947  AMMONIA 26   Coagulation Profile: Recent Labs  Lab 03/08/24 1829 03/13/24 1753 03/14/24 0415  INR 1.4* 1.2 1.2   Cardiac Enzymes: Recent Labs  Lab 03/08/24 1829  CKTOTAL 262   BNP (last 3 results) No results for input(s): "PROBNP" in the last 8760 hours. HbA1C: No results for input(s): "HGBA1C" in the last 72 hours. CBG: Recent Labs  Lab 03/12/24 2216 03/13/24 0753 03/13/24 1109 03/13/24 1547 03/14/24 0001  GLUCAP 122* 126* 133* 112* 129*   Lipid Profile: No results for input(s): "CHOL", "HDL", "LDLCALC", "TRIG", "CHOLHDL", "LDLDIRECT" in the last 72 hours. Thyroid Function Tests: No results for input(s): "TSH", "T4TOTAL", "FREET4", "T3FREE", "THYROIDAB" in the last 72 hours. Anemia Panel: No results for input(s): "VITAMINB12", "FOLATE", "FERRITIN", "TIBC", "IRON", "RETICCTPCT" in the last 72 hours. Sepsis Labs: Recent Labs  Lab 03/08/24 2117  PROCALCITON <0.10    Recent Results (from the past  240 hours)  Culture, blood (Routine X 2) w Reflex to ID Panel     Status:  None (Preliminary result)   Collection Time: 03/08/24  6:45 PM   Specimen: BLOOD LEFT HAND  Result Value Ref Range Status   Specimen Description BLOOD LEFT HAND  Final   Special Requests   Final    BOTTLES DRAWN AEROBIC ONLY Blood Culture results may not be optimal due to an inadequate volume of blood received in culture bottles   Culture  Setup Time   Final    GRAM POSITIVE RODS AEROBIC BOTTLE ONLY CRITICAL RESULT CALLED TO, READ BACK BY AND VERIFIED WITH: PHARMD ELIZABETH MARTIN ON 03/10/24 @ 1336 BY DRT    Culture   Final    GRAM POSITIVE RODS CULTURE REINCUBATED FOR BETTER GROWTH Performed at [facility], [address], [place], [place] [address]    Report Status PENDING  Incomplete  Culture, blood (Routine X 2) w Reflex to ID Panel     Status: None (Preliminary result)   Collection Time: 03/08/24  6:50 PM   Specimen: BLOOD LEFT HAND  Result Value Ref Range Status   Specimen Description BLOOD LEFT HAND  Final   Special Requests   Final    BOTTLES DRAWN AEROBIC ONLY Blood Culture results may not be optimal due to an inadequate volume of blood received in culture bottles   Culture  Setup Time   Final    GRAM POSITIVE RODS AEROBIC BOTTLE ONLY CRITICAL VALUE NOTED.  VALUE IS CONSISTENT WITH PREVIOUSLY REPORTED AND CALLED VALUE.    Culture   Final    GRAM POSITIVE RODS SENT TO [facility] FOR ID Performed at [facility], [address], [place], [place] [address]    Report Status PENDING  Incomplete  MRSA Next Gen by PCR, Nasal     Status: None   Collection Time: 03/09/24  2:12 PM   Specimen: Nasal Mucosa; Nasal Swab  Result Value Ref Range Status   MRSA by PCR Next Gen NOT DETECTED NOT DETECTED Final    Comment: (NOTE) The GeneXpert MRSA Assay (FDA approved for NASAL specimens only), is one component of a comprehensive MRSA colonization surveillance program. It is not intended to diagnose MRSA infection nor to guide or monitor treatment for MRSA  infections. Test performance is not FDA approved in patients less than 35 years old. Performed at [facility], [address] [place], [place] [address]          Radiology Studies: No results found.  Scheduled Meds:  atorvastatin   80 mg Oral Daily   Chlorhexidine  Gluconate Cloth  6 each Topical Q0600   pantoprazole   40 mg Oral Daily   polyethylene glycol  17 g Oral Daily   potassium chloride   40 mEq Oral Once   senna  1 tablet Oral BID   sodium chloride   2 g Oral TID WC   warfarin  7.5 mg Oral ONCE-1600   Warfarin - Pharmacist Dosing Inpatient   Does not apply q1600   Continuous Infusions:  sodium chloride  100 mL/hr at 03/13/24 2341   heparin  1,050 Units/hr (03/13/24 2104)   valproate sodium 500 mg (03/14/24 0516)     LOS: 6 days   Time spent:  Haydee Lipa, DO [facility]  If 7PM-7AM, please contact night-coverage [email or web address]  03/14/2024, 7:35 AM

## 2024-03-14 NOTE — Plan of Care (Signed)

## 2024-03-14 NOTE — Progress Notes (Signed)
 PHARMACY - ANTICOAGULATION CONSULT NOTE  Pharmacy Consult:  Heparin  / Coumadin Indication: CVTS  No Known Allergies  Patient Measurements: Height: 5\' 4"  (162.6 cm) Weight: 65 kg (143 lb 4.8 oz) IBW/kg (Calculated) : 59.2 HEPARIN  DW (KG): 65  Vital Signs: Temp: 98.2 F (36.8 C) (05/04 0334) Temp Source: Oral (05/04 0334)  Labs: Recent Labs    03/12/24 0631 03/13/24 0602 03/13/24 1753 03/14/24 0415  HGB 13.2 13.6  --  13.2  HCT 41.1 42.1  --  40.8  PLT 239 241  --  248  APTT  --   --  53* 80*  LABPROT  --   --  15.2 15.0  INR  --   --  1.2 1.2  HEPARINUNFRC  --   --   --  >1.10*  CREATININE 0.76 0.95  --  1.01    Estimated Creatinine Clearance: 86.3 mL/min (by C-G formula based on SCr of 1.01 mg/dL).   Assessment: 48 YOM with history of sinus thrombosis on Eliquis  PTA. He returned with new onset of R-sided weakness and cerebral edema.  Patient was initially on IV heparin , then transitioned back to PTA Eliquis .  Pharmacy consulted to transition patient to IV heparin  bridge to Coumadin starting 5/3.    Last Eliquis  dose was on 5/2 at 2118, so will use aPTT to guide heparin  dosing until heparin  level and PTT correlate.  aPTT therapeutic and INR remains sub-therapeutic as expected.  Not eating well.  CBC stable; no bleeding reported.  Goal of Therapy:  INR 2-3 Heparin  level 0.3-0.5 units/ml aPTT 66-85 seconds Monitor platelets by anticoagulation protocol: Yes   Plan:   Continue IV heparin  at 1050 units/hr Repeat Coumadin 7.5mg  PO today Daily heparin  level, aPTT, PT / INR and CBC  Annaclaire Walsworth D. Marikay Show, PharmD, BCPS, BCCCP 03/14/2024, 7:32 AM

## 2024-03-15 ENCOUNTER — Inpatient Hospital Stay (HOSPITAL_COMMUNITY)

## 2024-03-15 DIAGNOSIS — E785 Hyperlipidemia, unspecified: Secondary | ICD-10-CM | POA: Diagnosis not present

## 2024-03-15 DIAGNOSIS — R1310 Dysphagia, unspecified: Secondary | ICD-10-CM | POA: Diagnosis not present

## 2024-03-15 DIAGNOSIS — G08 Intracranial and intraspinal phlebitis and thrombophlebitis: Secondary | ICD-10-CM | POA: Diagnosis not present

## 2024-03-15 DIAGNOSIS — G936 Cerebral edema: Secondary | ICD-10-CM | POA: Diagnosis not present

## 2024-03-15 LAB — BASIC METABOLIC PANEL WITH GFR
Anion gap: 8 (ref 5–15)
BUN: 6 mg/dL (ref 6–20)
CO2: 18 mmol/L — ABNORMAL LOW (ref 22–32)
Calcium: 9.2 mg/dL (ref 8.9–10.3)
Chloride: 113 mmol/L — ABNORMAL HIGH (ref 98–111)
Creatinine, Ser: 0.77 mg/dL (ref 0.61–1.24)
GFR, Estimated: >60 mL/min (ref >60)
Glucose, Bld: 117 mg/dL — ABNORMAL HIGH (ref 70–99)
Potassium: 3.5 mmol/L (ref 3.5–5.1)
Sodium: 139 mmol/L (ref 135–145)

## 2024-03-15 LAB — CBC
HCT: 40 % (ref 39.0–52.0)
Hemoglobin: 13.5 g/dL (ref 13.0–17.0)
MCH: 27.1 pg (ref 26.0–34.0)
MCHC: 33.8 g/dL (ref 30.0–36.0)
MCV: 80.2 fL (ref 80.0–100.0)
Platelets: 255 10*3/uL (ref 150–400)
RBC: 4.99 MIL/uL (ref 4.22–5.81)
RDW: 12.7 % (ref 11.5–15.5)
WBC: 12 10*3/uL — ABNORMAL HIGH (ref 4.0–10.5)
nRBC: 0 % (ref 0.0–0.2)

## 2024-03-15 LAB — GLUCOSE, CAPILLARY
Glucose-Capillary: 120 mg/dL — ABNORMAL HIGH (ref 70–99)
Glucose-Capillary: 122 mg/dL — ABNORMAL HIGH (ref 70–99)
Glucose-Capillary: 129 mg/dL — ABNORMAL HIGH (ref 70–99)
Glucose-Capillary: 116 mg/dL — ABNORMAL HIGH (ref 70–99)
Glucose-Capillary: 115 mg/dL — ABNORMAL HIGH (ref 70–99)

## 2024-03-15 LAB — HEPARIN LEVEL (UNFRACTIONATED): Heparin Unfractionated: >1.1 [IU]/mL — ABNORMAL HIGH (ref 0.30–0.70)

## 2024-03-15 LAB — APTT
aPTT: 156 s — ABNORMAL HIGH (ref 24–36)
aPTT: 161 s (ref 24–36)
aPTT: 98 s — ABNORMAL HIGH (ref 24–36)

## 2024-03-15 LAB — PROTIME-INR
INR: 2 — ABNORMAL HIGH (ref 0.8–1.2)
Prothrombin Time: 22.6 s — ABNORMAL HIGH (ref 11.4–15.2)

## 2024-03-15 LAB — POCT ACTIVATED CLOTTING TIME: Activated Clotting Time: 233 s

## 2024-03-15 MED ORDER — WARFARIN SODIUM 4 MG PO TABS: 4.0000 mg | ORAL_TABLET | Freq: Once | ORAL | Status: DC

## 2024-03-15 MED ORDER — WARFARIN SODIUM 2.5 MG PO TABS
2.5000 mg | ORAL_TABLET | Freq: Once | ORAL | Status: AC
  Administered 2024-03-15: 2.5 mg via ORAL
  Filled 2024-03-15: qty 1

## 2024-03-15 MED ORDER — ENSURE ENLIVE PO LIQD
237.0000 mL | Freq: Two times a day (BID) | ORAL | Status: DC
  Administered 2024-03-16 – 2024-03-17 (×2): 237 mL via ORAL

## 2024-03-15 MED ORDER — HEPARIN (PORCINE) 25000 UT/250ML-% IV SOLN
750.0000 [IU]/h | INTRAVENOUS | Status: DC
  Administered 2024-03-15: 900 [IU]/h via INTRAVENOUS
  Filled 2024-03-15: qty 250

## 2024-03-15 NOTE — IPAL (Signed)
  PROGRESS NOTE    Samuel Warren  AOZ:[id number] DOB: 1991-07-23 DOA: 03/08/2024 PCP: Alto Atta, NP   Brief Narrative:  Lengthy goals of care discussion in regards to patient's poor p.o. intake.  Wife discussed via translator understands that if he does not improve his intake he will require feeding tube or he will continue to worsen.  Via sign language interpreter he appears to understand that if he does not eat he would need a feeding tube which he agrees with at this time.  Plan for family meeting later this week to discuss short-term and long-term goals for Samuel Warren.  *Should further discussion move forward and patient or family declines PEG tube placement would likely need to involve palliative care to discuss transitioning towards palliative which would likely include discharging home and CODE STATUS change.  Currently patient remains full code and tentative plan is for PEG tube placement later this week if his p.o. status continues to deteriorate.  Assessment & Plan:   Principal Problem:   Dural venous sinus thrombosis Active Problems:   Acute cerebral venous sinus thrombosis   Right hemiplegia (HCC)   LOS: 7 days    Time spent:  Haydee Lipa, DO [facility]  03/15/2024, 1:01 PM

## 2024-03-15 NOTE — Plan of Care (Signed)
 Problem: Education: Goal: Knowledge of General Education information will improve Description: Including pain rating scale, medication(s)/side effects and non-pharmacologic comfort measures 03/15/2024 2333 by Alejos Husband, RN Outcome: Progressing 03/15/2024 2229 by Alejos Husband, RN Outcome: Progressing   Problem: Health Behavior/Discharge Planning: Goal: Ability to manage health-related needs will improve 03/15/2024 2333 by Alejos Husband, RN Outcome: Progressing 03/15/2024 2229 by Alejos Husband, RN Outcome: Progressing   Problem: Clinical Measurements: Goal: Ability to maintain clinical measurements within normal limits will improve 03/15/2024 2333 by Alejos Husband, RN Outcome: Progressing 03/15/2024 2229 by Alejos Husband, RN Outcome: Progressing Goal: Will remain free from infection 03/15/2024 2333 by Alejos Husband, RN Outcome: Progressing 03/15/2024 2229 by Alejos Husband, RN Outcome: Progressing Goal: Diagnostic test results will improve 03/15/2024 2333 by Alejos Husband, RN Outcome: Progressing 03/15/2024 2229 by Alejos Husband, RN Outcome: Progressing Goal: Respiratory complications will improve 03/15/2024 2333 by Alejos Husband, RN Outcome: Progressing 03/15/2024 2229 by Alejos Husband, RN Outcome: Progressing Goal: Cardiovascular complication will be avoided 03/15/2024 2333 by Alejos Husband, RN Outcome: Progressing 03/15/2024 2229 by Alejos Husband, RN Outcome: Progressing   Problem: Activity: Goal: Risk for activity intolerance will decrease 03/15/2024 2333 by Alejos Husband, RN Outcome: Progressing 03/15/2024 2229 by Alejos Husband, RN Outcome: Progressing   Problem: Nutrition: Goal: Adequate nutrition will be maintained 03/15/2024 2333 by Alejos Husband, RN Outcome: Progressing 03/15/2024 2229 by Alejos Husband, RN Outcome: Progressing   Problem: Coping: Goal: Level of anxiety will decrease 03/15/2024  2333 by Alejos Husband, RN Outcome: Progressing 03/15/2024 2229 by Alejos Husband, RN Outcome: Progressing   Problem: Elimination: Goal: Will not experience complications related to bowel motility 03/15/2024 2333 by Alejos Husband, RN Outcome: Progressing 03/15/2024 2229 by Alejos Husband, RN Outcome: Progressing Goal: Will not experience complications related to urinary retention 03/15/2024 2333 by Alejos Husband, RN Outcome: Progressing 03/15/2024 2229 by Alejos Husband, RN Outcome: Progressing   Problem: Pain Managment: Goal: General experience of comfort will improve and/or be controlled 03/15/2024 2333 by Alejos Husband, RN Outcome: Progressing 03/15/2024 2229 by Alejos Husband, RN Outcome: Progressing   Problem: Safety: Goal: Ability to remain free from injury will improve 03/15/2024 2333 by Alejos Husband, RN Outcome: Progressing 03/15/2024 2229 by Alejos Husband, RN Outcome: Progressing   Problem: Skin Integrity: Goal: Risk for impaired skin integrity will decrease 03/15/2024 2333 by Alejos Husband, RN Outcome: Progressing 03/15/2024 2229 by Alejos Husband, RN Outcome: Progressing   Problem: Education: Goal: Understanding of CV disease, CV risk reduction, and recovery process will improve 03/15/2024 2333 by Alejos Husband, RN Outcome: Progressing 03/15/2024 2229 by Alejos Husband, RN Outcome: Progressing Goal: Individualized Educational Video(s) 03/15/2024 2333 by Alejos Husband, RN Outcome: Progressing 03/15/2024 2229 by Alejos Husband, RN Outcome: Progressing   Problem: Activity: Goal: Ability to return to baseline activity level will improve 03/15/2024 2333 by Alejos Husband, RN Outcome: Progressing 03/15/2024 2229 by Alejos Husband, RN Outcome: Progressing   Problem: Cardiovascular: Goal: Ability to achieve and maintain adequate cardiovascular perfusion will improve 03/15/2024 2333 by Alejos Husband,  RN Outcome: Progressing 03/15/2024 2229 by Alejos Husband, RN Outcome: Progressing Goal: Vascular access site(s) Level 0-1 will be maintained 03/15/2024 2333 by Alejos Husband, RN Outcome: Progressing 03/15/2024 2229 by Alejos Husband, RN Outcome: Progressing   Problem: Health Behavior/Discharge Planning: Goal: Ability to safely manage  health-related needs after discharge will improve 03/15/2024 2333 by Alejos Husband, RN Outcome: Progressing 03/15/2024 2229 by Alejos Husband, RN Outcome: Progressing

## 2024-03-15 NOTE — Progress Notes (Signed)
 Physical Therapy Treatment Patient Details Name: Samuel Warren MRN: [id number] DOB: 09-Feb-1991 Today's Date: 03/15/2024   History of Present Illness 33 y.o. male admitted 03/09/24 with R-side weakness. Head CT with unchanged small subacute L frontal infarct. CTA with worsening dural venous sinus thrombus. S/p bilateral common carotid angiograms, internal jugular venograms and unsuccessful thrombectomy on 4/29. EEG 4/30 without seizures. 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    PT Comments  Pt received in supine with noted R cervical rotation and R lateral flexion, pt and spouse and ASL interpreter present in room and RN clearance for therapies to work with him. Pt with good effort for all tasks, emphasis on transfer to L EOB, seated balance and functional reaching activity as well as transfer training. Pt limited due to R hemibody weakness and inattention (tending to use L HB to reach when cued to use his R, but able to use R with additional cues) and posterior and R truncal lean in sitting and standing. Pt needing up to +2 modA for transfer to EOB and up to +2 maxA for attempting weight shift/lateral steps in standing posture. BP 124/82 (95) taken resting prior to OOB, and BP 127/91 taken after return to bed with bed in upright chair posture. HR ~40's-50's bpm resting and increased with activity although not recorded due to need for +2 physical assist when pt standing. SpO2 appears WFL throughout on RA. Patient will benefit from intensive inpatient follow-up therapy, >3 hours/day and appears to have extensive multidisciplinary therapy needs at this time.   If plan is discharge home, recommend the following: A lot of help with walking and/or transfers;A lot of help with bathing/dressing/bathroom;Assistance with cooking/housework;Assist for transportation;Help with stairs or ramp for entrance   Can travel by private vehicle         Equipment Recommendations  Wheelchair (measurements PT);Wheelchair cushion (measurements PT);Other (comment) (may need specialtyor motorized wheelchair with high back and cervical/occipital support pending progress)    Recommendations for Other Services       Precautions / Restrictions Precautions Precautions: Fall;Other (comment) Recall of Precautions/Restrictions: Intact Precaution/Restrictions Comments: R cervical rotation and some R inattention Restrictions Weight Bearing Restrictions Per Provider Order: No     Mobility  Bed Mobility Overal bed mobility: Needs Assistance Bed Mobility: Supine to Sit   Sidelying to sit: Mod assist, +2 for physical assistance, HOB elevated   Sit to supine: Max assist, +2 for physical assistance, HOB elevated   General bed mobility comments: Increased assist returning to supine due to pt fatigue    Transfers Overall transfer level: Needs assistance Equipment used: 2 person hand held assist, Rolling walker (2 wheels) Transfers: Sit to/from Stand, Bed to chair/wheelchair/BSC Sit to Stand: +2 physical assistance, Max assist           General transfer comment: posterior lean throughout and R knee blocked while pt stepping with LLE due to RLE weakness/buckling. Manual assist to weight shift for offloading pressure when side stepping with opposite leg.    Ambulation/Gait             Pre-gait activities: a few shuffled steps toward his L side with +2 maxA see transfers section     Stairs             Wheelchair Mobility     Tilt Bed    Modified Rankin (Stroke Patients Only) Modified Rankin (Stroke Patients Only) Pre-Morbid Rankin Score: No significant disability Modified Rankin: Severe disability  Balance Overall balance assessment: Needs assistance Sitting-balance support: No upper extremity supported, Feet unsupported Sitting balance-Leahy Scale: Poor Sitting balance - Comments: R lateral and posterior lean,  only briefly improves with seated anterior reaching activity Postural control: Right lateral lean, Left lateral lean, Posterior lean Standing balance support: Bilateral upper extremity supported Standing balance-Leahy Scale: Zero Standing balance comment: +2 modA static standing, progressing to +2 maxA for dynamic standing/weight shifting tasks                            Communication Communication Communication: Impaired Factors Affecting Communication: Hearing impaired;Other (comment) (sign language interpreter present who is familiar with his communication style)  Cognition Arousal: Alert Behavior During Therapy: WFL for tasks assessed/performed   PT - Cognitive impairments: Difficult to assess Difficult to assess due to: Hard of hearing/deaf, Non-English speaking                     PT - Cognition Comments: h/o deafness baseline. pt following gestural commands appropriately. noted decreased initiation at times, especially with R-side. Pt does have R cervical rotation but gazes well to his L when cued and decreased RUE/LE initiation. Following commands: Impaired Following commands impaired: Only follows one step commands consistently (follows commands better with L hemibody but does follow motor commands on R hemibody with visual/tactile cues)    Cueing Cueing Techniques: Gestural cues, Tactile cues, Visual cues  Exercises Other Exercises Other Exercises: seated functional reach (to L/R and anterior directions) RUE x10 and LUE x5 Other Exercises: L cervical rotation x5 reps (with rest breaks in between) and myofascial gentle stretch to upper and mid-traps while pt attempting to rotate neck to L side Other Exercises: supine L and R finger and thumb opposition (pt able to perform on each hand with visual demo to initiate, similar performance to each side). Per ASL interpreter, pt is familiar with this exercise from OP therapies. Other Exercises: static standing ~2 mins  for BLE strengthening    General Comments General comments (skin integrity, edema, etc.): VSS      Pertinent Vitals/Pain Pain Assessment Pain Assessment: Faces Faces Pain Scale: Hurts whole lot Pain Location: R neck Pain Descriptors / Indicators: Grimacing, Headache, Guarding Pain Intervention(s): Limited activity within patient's tolerance, Monitored during session, Repositioned, Other (comment) (does not appear that any pain meds have been given today)    Home Living                          Prior Function            PT Goals (current goals can now be found in the care plan section) Acute Rehab PT Goals Patient Stated Goal: not stated PT Goal Formulation: Patient unable to participate in goal setting Time For Goal Achievement: 03/24/24 Progress towards PT goals: Progressing toward goals    Frequency    Min 2X/week      PT Plan      Co-evaluation PT/OT/SLP Co-Evaluation/Treatment: Yes Reason for Co-Treatment: For patient/therapist safety;Complexity of the patient's impairments (multi-system involvement);To address functional/ADL transfers;Necessary to address cognition/behavior during functional activity PT goals addressed during session: Mobility/safety with mobility;Balance;Strengthening/ROM OT goals addressed during session: ADL's and self-care;Other (comment) (coordinating sign language interpreter)      AM-PAC PT "6 Clicks" Mobility   Outcome Measure  Help needed turning from your back to your side while in a flat bed without using bedrails?: A Lot  Help needed moving from lying on your back to sitting on the side of a flat bed without using bedrails?: A Lot Help needed moving to and from a bed to a chair (including a wheelchair)?: Total Help needed standing up from a chair using your arms (e.g., wheelchair or bedside chair)?: Total Help needed to walk in hospital room?: Total Help needed climbing 3-5 steps with a railing? : Total 6 Click Score:  8    End of Session Equipment Utilized During Treatment: Gait belt Activity Tolerance: Patient tolerated treatment well Patient left: in bed;with call bell/phone within reach;with bed alarm set;Other (comment) (bed in chair egress posture, elevated HOB to ~60 degrees, pt states too much discomfort when HOB elevated >60 degrees; pillows behind R side of pillow/head to promote more neutral cervical posture) Nurse Communication: Mobility status;Other (comment) (pain score; neuro MD present during session to see his functional status with standing  trial and seated balance) PT Visit Diagnosis: Other abnormalities of gait and mobility (R26.89);Muscle weakness (generalized) (M62.81)     Time: 1237-1300 PT Time Calculation (min) (ACUTE ONLY): 23 min  Charges:    $Therapeutic Exercise: 8-22 mins PT General Charges $$ ACUTE PT VISIT: 1 Visit                    Giovonni Poirier P., PTA Acute Rehabilitation Services Secure Chat Preferred 9a-5:30pm Office: [phone]    Mariel Shope [facility] 03/15/2024, 3:38 PM

## 2024-03-15 NOTE — Progress Notes (Signed)
 PROGRESS NOTE    [name]  KVQ:[id number] DOB: Dec 31, 1990 DOA: 03/08/2024 PCP: Alto Atta, NP   Brief Narrative:  Samuel Warren is a 33 y.o. male with medical history significant of deafness, venous sinus thrombosis on eliquis , and meningoencephalitis presented to ED on 4/28 as a code stroke after being found down w/ new onset R sided weakness.  Patient has known cardiovascular disease with multiple CVA and thrombectomies.   Assessment & Plan:   Principal Problem:   Dural venous sinus thrombosis Active Problems:   Acute cerebral venous sinus thrombosis   Right hemiplegia (HCC)  4/28 admitted, code stroke called on him 4/29 Radiology with anesthesia - IR thrombectomy veno mesh-  Unsuccessful   4/30 Continuous EEG unremarkable 5/2 hypertonic saline discontinued at 11pm 5/4 transition to hospitalist medicine team 5/5 Lengthy goals of care discussion in regards to patient's poor p.o. intake.  Wife discussed via translator understands that if he does not improve his intake he will require feeding tube or he will continue to worsen.  Via sign language interpreter he appears to understand that if he does not eat he would need a feeding tube which he agrees with at this time.  Plan for family meeting later this week to discuss short-term and long-term goals for Samuel Warren.  Goals of care  Lengthy goals of care discussion as above in regards to poor p.o. intake, discussed possible need for PEG tube wife and patient appeared to be agreeable if necessary.  Discussed case with IR, will need to be able to lie supine for case.  If patient needs PEG placed will also need noncontrast CT abdomen to evaluate anatomical structures.  Chronic dural sinus thrombosis Cerebral edema Acute Encephalopathy Acute intractable headaches - S/p attempt thrombectomy 4/29 without success - transitioning from heparin  gtt to warfarin - Neurology is following  - Hypertonic saline discontinued 5/2, given diamox   5/2 - 2g sodium tabs - Medrol dose pack started 5/4 - Depakote IV ongoing   Hypokalemia - replete as necessary (patient refusing PO - transition to IV)   Metabolic Acidosis - low serum bicarb in setting of diamox  adinistration - Continue IV fluids with normal saline   DVT prophylaxis: heparin  drip Code Status:   Code Status: Full Code Family Communication: Wife at bedside, [place] translator used  Status is: Inpatient  Dispo: The patient is from: Home              Anticipated d/c is to: To be determined              Anticipated d/c date is: To be determined              Patient currently not medically stable for discharge  Consultants:  PCCM, neurology, interventional radiology  Antimicrobials:  None  Subjective: No acute issues or events overnight, review of systems markedly limited by patient's inability to communicate  Objective: Vitals:   03/15/24 0350 03/15/24 0400 03/15/24 0500 03/15/24 0600  BP:  (!) 122/92 119/87 116/77  Pulse:      Resp:  16 15 17   Temp: 97.6 F (36.4 C)     TempSrc: Axillary     SpO2:      Weight:      Height:        Intake/Output Summary (Last 24 hours) at 03/15/2024 0703 Last data filed at 03/14/2024 1800 Gross per 24 hour  Intake 3371.69 ml  Output 2125 ml  Net 1246.69 ml   Filed Weights   03/08/24  1400  Weight: 65 kg    Examination:  General:  Pleasantly resting in bed, No acute distress.  Poorly interactive but does not appear in pain HEENT:  Normocephalic atraumatic.  Sclerae nonicteric, noninjected.  Extraocular movements intact bilaterally. Neck:  Without mass or deformity.  Trachea is midline. Lungs:  Clear to auscultate bilaterally without rhonchi, wheeze, or rales. Heart:  Regular rate and rhythm.  Without murmurs, rubs, or gallops. Abdomen:  Soft, nontender, nondistended.  Without guarding or rebound. Extremities: Without cyanosis, clubbing, edema, or obvious deformity.  Data Reviewed: I have personally  reviewed following labs and imaging studies  CBC: Recent Labs  Lab 03/08/24 1829 03/09/24 0543 03/10/24 1032 03/11/24 9604 03/12/24 0631 03/13/24 0602 03/14/24 0415 03/15/24 0500  WBC 12.6*   < > 9.3 7.5 7.2 9.5 9.0 12.0*  NEUTROABS 10.1*  --  6.8  --   --   --   --   --   HGB 14.6   < > 12.3* 11.9* 13.2 13.6 13.2 13.5  HCT 44.4   < > 37.5* 36.2* 41.1 42.1 40.8 40.0  MCV 81.3   < > 83.1 82.1 81.9 82.5 81.9 80.2  PLT 230   < > 224 208 239 241 248 255   < > = values in this interval not displayed.   Basic Metabolic Panel: Recent Labs  Lab 03/10/24 0452 03/10/24 1300 03/11/24 0600 03/11/24 0621 03/11/24 1202 03/12/24 0631 03/12/24 1325 03/12/24 1809 03/12/24 2345 03/13/24 0602 03/14/24 0415 03/15/24 0500  NA 148*   < >  --  146*   < > 143   < > 141 139 139 138 139  K 3.2*  --   --  2.9*  --  3.7  --   --   --  3.3* 3.4* 3.5  CL 122*  --   --  120*  --  117*  --   --   --  117* 114* 113*  CO2 17*  --   --  18*  --  18*  --   --   --  15* 16* 18*  GLUCOSE 118*  --   --  111*  --  111*  --   --   --  112* 133* 117*  BUN 8  --   --  5*  --  <5*  --   --   --  6 9 6   CREATININE 0.98  --   --  0.82  --  0.76  --   --   --  0.95 1.01 0.77  CALCIUM  8.8*  --   --  8.5*  --  9.2  --   --   --  9.5 9.1 9.2  MG 1.8  --  1.9  --   --  2.2  --   --   --   --   --   --   PHOS  --   --   --   --   --  3.3  --   --   --   --   --   --    < > = values in this interval not displayed.   GFR: Estimated Creatinine Clearance: 108.9 mL/min (by C-G formula based on SCr of 0.77 mg/dL). Liver Function Tests: Recent Labs  Lab 03/08/24 1829 03/10/24 0452  AST 25 25  ALT 42 41  ALKPHOS 66 52  BILITOT 0.8 0.5  PROT 7.3 6.1*  ALBUMIN 4.2 3.4*   No results  for input(s): "LIPASE", "AMYLASE" in the last 168 hours. Recent Labs  Lab 03/09/24 0947  AMMONIA 26   Coagulation Profile: Recent Labs  Lab 03/08/24 1829 03/13/24 1753 03/14/24 0415 03/15/24 0500  INR 1.4* 1.2 1.2 2.0*    Cardiac Enzymes: Recent Labs  Lab 03/08/24 1829  CKTOTAL 262   BNP (last 3 results) No results for input(s): "PROBNP" in the last 8760 hours. HbA1C: No results for input(s): "HGBA1C" in the last 72 hours. CBG: Recent Labs  Lab 03/14/24 0001 03/14/24 0748 03/14/24 1122 03/14/24 1658 03/14/24 2129  GLUCAP 129* 120* 118* 111* 102*   Lipid Profile: No results for input(s): "CHOL", "HDL", "LDLCALC", "TRIG", "CHOLHDL", "LDLDIRECT" in the last 72 hours. Thyroid Function Tests: No results for input(s): "TSH", "T4TOTAL", "FREET4", "T3FREE", "THYROIDAB" in the last 72 hours. Anemia Panel: No results for input(s): "VITAMINB12", "FOLATE", "FERRITIN", "TIBC", "IRON", "RETICCTPCT" in the last 72 hours. Sepsis Labs: Recent Labs  Lab 03/08/24 2117  PROCALCITON <0.10    Recent Results (from the past 240 hours)  Culture, blood (Routine X 2) w Reflex to ID Panel     Status: None (Preliminary result)   Collection Time: 03/08/24  6:45 PM   Specimen: BLOOD LEFT HAND  Result Value Ref Range Status   Specimen Description BLOOD LEFT HAND  Final   Special Requests   Final    BOTTLES DRAWN AEROBIC ONLY Blood Culture results may not be optimal due to an inadequate volume of blood received in culture bottles   Culture  Setup Time   Final    GRAM POSITIVE RODS AEROBIC BOTTLE ONLY CRITICAL RESULT CALLED TO, READ BACK BY AND VERIFIED WITH: PHARMD ELIZABETH MARTIN ON 03/10/24 @ 1336 BY DRT    Culture   Final    GRAM POSITIVE RODS CULTURE REINCUBATED FOR BETTER GROWTH Performed at [facility], [address], [place], [place] [address]    Report Status PENDING  Incomplete  Culture, blood (Routine X 2) w Reflex to ID Panel     Status: None (Preliminary result)   Collection Time: 03/08/24  6:50 PM   Specimen: BLOOD LEFT HAND  Result Value Ref Range Status   Specimen Description BLOOD LEFT HAND  Final   Special Requests   Final    BOTTLES DRAWN AEROBIC ONLY Blood Culture results may  not be optimal due to an inadequate volume of blood received in culture bottles   Culture  Setup Time   Final    GRAM POSITIVE RODS AEROBIC BOTTLE ONLY CRITICAL VALUE NOTED.  VALUE IS CONSISTENT WITH PREVIOUSLY REPORTED AND CALLED VALUE.    Culture   Final    GRAM POSITIVE RODS SENT TO [facility] FOR ID Performed at [facility], [address] [place], [place] [address]    Report Status PENDING  Incomplete  MRSA Next Gen by PCR, Nasal     Status: None   Collection Time: 03/09/24  2:12 PM   Specimen: Nasal Mucosa; Nasal Swab  Result Value Ref Range Status   MRSA by PCR Next Gen NOT DETECTED NOT DETECTED Final    Comment: (NOTE) The GeneXpert MRSA Assay (FDA approved for NASAL specimens only), is one component of a comprehensive MRSA colonization surveillance program. It is not intended to diagnose MRSA infection nor to guide or monitor treatment for MRSA infections. Test performance is not FDA approved in patients less than 20 years old. Performed at [facility], [address] [place], [place] [address]  Radiology Studies: No results found.  Scheduled Meds:  atorvastatin   80 mg Oral Daily   Chlorhexidine  Gluconate Cloth  6 each Topical Q0600   methylPREDNISolone  4 mg Oral 3 x daily with food   [START ON 03/16/2024] methylPREDNISolone  4 mg Oral 4X daily taper   methylPREDNISolone  8 mg Oral Nightly   pantoprazole   40 mg Oral Daily   polyethylene glycol  17 g Oral BID   senna  1 tablet Oral BID   sodium chloride   2 g Oral TID WC   Warfarin - Pharmacist Dosing Inpatient   Does not apply q1600   Continuous Infusions:  sodium chloride  100 mL/hr at 03/14/24 1800   heparin  1,050 Units/hr (03/14/24 1800)   valproate sodium 500 mg (03/15/24 0523)     LOS: 7 days   Time spent:  Haydee Lipa, DO [facility]  If 7PM-7AM, please contact night-coverage [email or web address]  03/15/2024, 7:03 AM

## 2024-03-15 NOTE — Progress Notes (Addendum)
 Occupational Therapy Treatment Patient Details Name: Samuel Warren MRN: [id number] DOB: 1990/12/03 Today's Date: 03/15/2024   History of present illness 33 y.o. male admitted 03/09/24 with R-side weakness. Head CT with unchanged small subacute L frontal infarct. CTA with worsening dural venous sinus thrombus. S/p bilateral common carotid angiograms, internal jugular venograms and unsuccessful thrombectomy on 4/29. EEG 4/30 without seizures. 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   OT comments  Pt progressing toward goals this session, able to sit EOB for ~10 mins for seated ADL and functional reach task with RUE. Continues to need mod-max+2 for bed mobility and standing, pt with R lateral and posterior lean. Pt with neck pain, able to tolerate cervical stretching to bring head to midline, towel roll placed under pillow on R side of head at end of session. Pt presenting with impairments listed below, will follow acutely. Patient will benefit from intensive inpatient follow-up therapy, >3 hours/day to maximize safety/ind with ADL/functional mobility.       If plan is discharge home, recommend the following:  Two people to help with walking and/or transfers;A lot of help with bathing/dressing/bathroom;Assistance with cooking/housework;Direct supervision/assist for financial management;Direct supervision/assist for medications management;Assist for transportation;Help with stairs or ramp for entrance   Equipment Recommendations  Other (comment) (defer)    Recommendations for Other Services Rehab consult    Precautions / Restrictions Precautions Precautions: Fall;Other (comment) Recall of Precautions/Restrictions: Intact Precaution/Restrictions Comments: R inattention Restrictions Weight Bearing Restrictions Per Provider Order: No       Mobility Bed Mobility Overal bed mobility: Needs Assistance Bed Mobility: Supine  to Sit   Sidelying to sit: Mod assist, +2 for physical assistance Supine to sit: Mod assist, HOB elevated          Transfers Overall transfer level: Needs assistance Equipment used: 2 person hand held assist, Rolling walker (2 wheels) Transfers: Sit to/from Stand, Bed to chair/wheelchair/BSC Sit to Stand: Mod assist, +2 physical assistance           General transfer comment: pt takes side steps to L     Balance Overall balance assessment: Needs assistance Sitting-balance support: No upper extremity supported, Feet unsupported Sitting balance-Leahy Scale: Poor Sitting balance - Comments: R lateral and posterior lean   Standing balance support: Bilateral upper extremity supported Standing balance-Leahy Scale: Poor Standing balance comment: reliant on UE support and external assist for static standing                           ADL either performed or assessed with clinical judgement   ADL Overall ADL's : Needs assistance/impaired Eating/Feeding: Minimal assistance;Sitting   Grooming: Wash/dry face;Set up;Sitting   Upper Body Bathing: Moderate assistance;Sitting;Bed level   Lower Body Bathing: Maximal assistance;Sitting/lateral leans;Bed level   Upper Body Dressing : Moderate assistance;Sitting;Bed level   Lower Body Dressing: Maximal assistance;Sitting/lateral leans;Sit to/from stand   Toilet Transfer: Maximal assistance;+2 for physical assistance;Moderate assistance   Toileting- Clothing Manipulation and Hygiene: Maximal assistance       Functional mobility during ADLs: Moderate assistance;Maximal assistance;+2 for physical assistance      Extremity/Trunk Assessment Upper Extremity Assessment Upper Extremity Assessment: RUE deficits/detail RUE Deficits / Details: 3/5 lifts against gravity, grip is improving RUE Sensation: decreased light touch;decreased proprioception RUE Coordination: decreased fine motor;decreased gross motor   Lower Extremity  Assessment Lower Extremity Assessment: Defer to PT evaluation        Vision   Vision Assessment?:  Vision impaired- to be further tested in functional context Additional Comments: decr tracking to R but with R head turn ?neck pain? due to IJ   Perception Perception Perception: Impaired Preception Impairment Details: Inattention/Neglect Perception-Other Comments: R inattention   Praxis Praxis Praxis: WFL   Communication Communication Communication: Impaired Factors Affecting Communication: Hearing impaired;Other (comment) (sign language interpreter present)   Cognition Arousal: Alert Behavior During Therapy: WFL for tasks assessed/performed Cognition: No apparent impairments Difficult to assess due to: Non-English speaking, Hard of hearing/deaf           OT - Cognition Comments: sign language interpreter gesturing to pt, pt following commands to perform BUE/BLE ROM                 Following commands: Intact Following commands impaired: Only follows one step commands consistently      Cueing   Cueing Techniques: Gestural cues, Tactile cues, Visual cues  Exercises Other Exercises Other Exercises: seated functional reach RUE x10    Shoulder Instructions       General Comments VSS    Pertinent Vitals/ Pain       Pain Assessment Pain Assessment: Faces Pain Score: 8  Faces Pain Scale: Hurts whole lot Pain Location: R neck Pain Descriptors / Indicators: Grimacing, Headache Pain Intervention(s): Limited activity within patient's tolerance, Monitored during session, Repositioned  Home Living                                          Prior Functioning/Environment              Frequency  Min 2X/week        Progress Toward Goals  OT Goals(current goals can now be found in the care plan section)  Progress towards OT goals: Progressing toward goals  Acute Rehab OT Goals Patient Stated Goal: none stated OT Goal Formulation:  Patient unable to participate in goal setting Time For Goal Achievement: 03/24/24 Potential to Achieve Goals: Good ADL Goals Pt Will Perform Upper Body Dressing: sitting;with min assist Pt Will Perform Lower Body Dressing: with min assist;sitting/lateral leans Pt Will Transfer to Toilet: with +2 assist;squat pivot transfer;stand pivot transfer;bedside commode;with min assist Pt/caregiver will Perform Home Exercise Program: Right Upper extremity;Both right and left upper extremity  Plan      Co-evaluation    PT/OT/SLP Co-Evaluation/Treatment: Yes Reason for Co-Treatment: For patient/therapist safety;Complexity of the patient's impairments (multi-system involvement);To address functional/ADL transfers;Necessary to address cognition/behavior during functional activity PT goals addressed during session: Mobility/safety with mobility;Balance;Strengthening/ROM OT goals addressed during session: ADL's and self-care;Other (comment) (coordinating sign language interpreter)      AM-PAC OT "6 Clicks" Daily Activity     Outcome Measure   Help from another person eating meals?: A Lot Help from another person taking care of personal grooming?: A Lot Help from another person toileting, which includes using toliet, bedpan, or urinal?: A Lot Help from another person bathing (including washing, rinsing, drying)?: A Lot Help from another person to put on and taking off regular upper body clothing?: A Lot Help from another person to put on and taking off regular lower body clothing?: A Lot 6 Click Score: 12    End of Session    OT Visit Diagnosis: Unsteadiness on feet (R26.81);Repeated falls (R29.6);Low vision, both eyes (H54.2);Muscle weakness (generalized) (M62.81);Other symptoms and signs involving the nervous system (R29.898);Other symptoms and signs involving cognitive function  Activity Tolerance Patient tolerated treatment well   Patient Left in bed;with call bell/phone within reach;with  bed alarm set   Nurse Communication Mobility status        Time: 1237-1300 OT Time Calculation (min): 23 min  Charges: OT General Charges $OT Visit: 1 Visit OT Treatments $Self Care/Home Management : 8-22 mins  Kymorah Korf K, OTD, OTR/L SecureChat Preferred Acute Rehab [phone]   Benedict Brain Koonce 03/15/2024, 3:05 PM

## 2024-03-15 NOTE — Progress Notes (Signed)
 Patient arrived to 4N-PCU with family (mom, dad, brother, wife) at bedside. Adjusted heparin  to 750units/hr per order. Changed IV tubing lines. Skin assessment with Maryla Snooks. HR bradycardia in the upper 40s initially but eventually maintained in the lower 50s. Other VSS. Dr. Rudine Cos notified, no new orders. Initially it appeared that no family members would be able to speak English so a [place] language interpreter was consulted to help introduce the family to the unit and help assess patient. Interpreter arrived just as the patient's brother started to speak some [place]. Interpreter services were used and interpreter stayed to do bedside shift report with night nurse. Sherline Distel interpreter said that for this patient we may need to use a [place] interpreter and a sign language interpreter.

## 2024-03-15 NOTE — Plan of Care (Signed)
  Problem: Education: Goal: Knowledge of General Education information will improve Description: Including pain rating scale, medication(s)/side effects and non-pharmacologic comfort measures Outcome: Progressing   Problem: Clinical Measurements: Goal: Will remain free from infection Outcome: Progressing Goal: Diagnostic test results will improve Outcome: Progressing Goal: Cardiovascular complication will be avoided Outcome: Progressing   

## 2024-03-15 NOTE — Progress Notes (Signed)
 STROKE TEAM PROGRESS NOTE   INTERIM HISTORY/SUBJECTIVE His wife and sign language interpreter are at the bedside.    Patient is still having headaches, and scores 8/10 in severity but appears quite comfortable.  He was started on Medrol Dosepak yesterday  .  He is not transferred to a stepdown bed.  He is working with therapist at the bedside but appears to have significant truncal ataxia and leans to 1 side.  OBJECTIVE  CBC    Component Value Date/Time   WBC 12.0 (H) 03/15/2024 0500   RBC 4.99 03/15/2024 0500   HGB 13.5 03/15/2024 0500   HCT 40.0 03/15/2024 0500   PLT 255 03/15/2024 0500   MCV 80.2 03/15/2024 0500   MCH 27.1 03/15/2024 0500   MCHC 33.8 03/15/2024 0500   RDW 12.7 03/15/2024 0500   LYMPHSABS 1.5 03/10/2024 1032   MONOABS 1.0 03/10/2024 1032   EOSABS 0.0 03/10/2024 1032   BASOSABS 0.0 03/10/2024 1032    BMET    Component Value Date/Time   NA 139 03/15/2024 0500   K 3.5 03/15/2024 0500   CL 113 (H) 03/15/2024 0500   CO2 18 (L) 03/15/2024 0500   GLUCOSE 117 (H) 03/15/2024 0500   BUN 6 03/15/2024 0500   CREATININE 0.77 03/15/2024 0500   CALCIUM  9.2 03/15/2024 0500   GFRNONAA >60 03/15/2024 0500    IMAGING past 24 hours No results found.   Vitals:   03/15/24 1100 03/15/24 1102 03/15/24 1200 03/15/24 1300  BP: 118/86  124/82 (!) 127/91  Pulse: (!) 49  (!) 57 (!) 145  Resp: 12  15 15   Temp:  98.2 F (36.8 C)    TempSrc:  Oral    SpO2: 98%  99% 92%  Weight:      Height:         PHYSICAL EXAM General:  Alert, well-nourished, well-developed patient in no acute distress Psych:  Mood and affect appropriate for situation CV: Regular rate and rhythm on monitor Respiratory:  Regular, unlabored respirations on room air GI: Abdomen soft and nontender NEURO:  Pt is awake, alert, eyes open, smile to provider. No gaze palsy, tracking bilaterally, blinking to visual threat bilaterally, PERRL. No facial droop. Tongue midline. Bilateral UEs 5/5, no drift.  Bilaterally LEs 5/5, no drift. b/l FTN intact, gait not tested.     ASSESSMENT/PLAN  Mr. Ado Gorelik is a 33 y.o. 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 CT head with no acute process. 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 CTA and CTV obtained. Heparin  gtt initiated. NIH on Admission 25  Worsening mental status, improved Patient seems more drowsy sleepy, hard to arouse on 4/29 CT repeat 4/29 showed increased cerebral edema Transferred to ICU for 3% saline with bolus S/p IR but not successful with IR Dr. Alvira Josephs Mental status much improved, back to basel ine now Tape down 3% saline  On LTM EEG - no seizure -> d/c  Persistent dural venous sinus thrombus:   Code Stroke CT head -  Unchanged small subacute left frontal lobe infarct in the setting of known dural venous sinus thrombosis. ASPECTS of 10. CTA head & neck No LVO CT Venogram Mild progression of extensive thrombus in the superior sagittal sinus posteriorly. Mild improvement of subocclusive thrombus in the  transverse and sigmoid sinuses bilaterally. Persistent bilateral cavernous sinus thrombosis. MRI  Evolving subacute venous  ischemic changes involving the paramedian frontal lobes and likely the left occipital lobe, not significantly changed as compared to previous. Mild associated petechial hemorrhage at the posterior left frontal region, stable from prior.  4/29- Stat Head CT generalized brain swelling.  On personal review, seems more cerebral edema than 4/28 3/18- 2D Echo - EF 45-50% in 01/2024 EEG- negative for seizure, mild to moderate diffuse encephalopathy LDL 58 HgbA1c 5.7 VTE prophylaxis - Heparin  IV Eliquis  (apixaban ) daily prior to admission, now on eliquis   Therapy recommendations:  CIR Disposition:  pending  Cerebral  edema Transferred to ICU on 4/29 due to decreased LOC Hypertonic saline started at 110ml/hr->40cc -> 20cc for 12 hours and then d/c goal sodium 150-155 Serial neurochecks Q6 Na Na 144 --141--[id number] Add Na tab 2g tid Headaches- likely due to cerebral edema - topamax  stopped 5/1 due to low bicarb.  Put on Diamox  500mg  bid x2 and BMP tomorrow if still low bicarb will need to consider alternatives  Hx of CVST Admitted 02/01/2024 for CVST.  Status post IR for thrombectomy and discharged on Eliquis . Readmitted 03/02/2024 for right-sided weakness.  CT head showed increased left frontal venous infarct.  CTV showed recurrent occlusive and subocclusive thrombi within the SSS posteriorly, extending to the confluence of sinus.  Thrombus extending into the medial aspect of the right transverse sinus.  Clot burden within the right sigmoid sinus has decreased.  Patient was concerning for dehydration at the time, and received fluid resuscitation.  Discharged in good condition However, per wife, patient discharged on 4/25 Friday, at the night of that day, patient drowsy sleepy not talking.  Saturday some improvement but not eating and drinking well able to take medication.  Sunday she did a poorly, drowsy sleepy not talking not eating, however wife was able to get medication down.  Monday yesterday, he was sent to ER for evaluation.  Hx of Stroke Recent viral meningitis January 27, 2024 right frontal 2-3 punctate infarcts, etiology: Likely due to infectious vasculitis vs. Small vessel disease in the setting of CNS infection  CSF at that time WBC 22, RBC 1330, protein 91, glucose 80, concerning for viral meningitis Completed course of Doxy No need LP repeat at this time given no sign of CNS infection and especially with cerebral edema and low-lying cerebellar tonsils extending up to 9 mm below the foreman magnum  Hyperlipidemia Home meds:  Atorvastatin  80, resumed LDL in 01/2024 279 This admission LDL  58, goal < 70 Continue statin on discharge  Dysphagia SLP consulted On dys 3 and thin liquid Advance diet as tolerated  Other Active Problems Hypokalemia K 3.2 - 2.9 - supplement -> 3.7  Hospital day # 7     His wife is at the bedside.. Pt is working with therapist at the bedside.  He noticed slight improvement in his headache .he is tracking, with body language and pain chart,  . Moving all extremities, no gaze palsy or neglect.  Have added Medrol Dosepak for refractory headache..  Continue IV hydration and  depacon for headache.  INR is optimal today at 2.0..  Plan to check CT head today as patient seems quite ataxic and is leaning to one side.  Hopefully transfer to floor and rehab in few days. D/w  Dr. Rudine Cos.  Greater than 50% time during this 50-minute visit was spent in counseling and coordination of care about his refractory headache and cerebral venous sinus thrombosis and discussion with patient and wife and care team and answering questions.  Ardella Beaver, MD  To contact Stroke Continuity provider, please refer to [email or web address]. After hours, contact General Neurology

## 2024-03-15 NOTE — TOC Progression Note (Signed)
 Transition of Care [facility]) - Progression Note    Patient Details  Name: Samuel Warren MRN: [id number] Date of Birth: [birth year]  Transition [facility]) CM/SW Contact  Tom-Johnson, Francene Mcerlean Daphne, RN Phone Number: 03/15/2024, 1:58 PM  Clinical Narrative:     Patient continues with poor appetite, plan for GOC meeting with family later this week to discuss short-term and long-term goals for patient per MD and also, plan is for PEG Tube placement later this week if his p.o. status continues to deteriorate. Currently on Heparin  gtt, Methylprednisolone and Warfarin. CIR following for possible admission.   Patient not Medically ready for discharge.  CM will continue to follow as patient progresses with care towards discharge.         Expected Discharge Plan: Home/Self Care Barriers to Discharge: Continued Medical Work up  Expected Discharge Plan and Services       Living arrangements for the past 2 months: Single Family Home                                       Social Determinants of Health (SDOH) Interventions SDOH Screenings   Food Insecurity: No Food Insecurity (03/08/2024)  Housing: Low Risk  (03/09/2024)  Transportation Needs: No Transportation Needs (03/08/2024)  Utilities: Not At Risk (03/08/2024)  Social Connections: Patient Declined (02/08/2024)  Tobacco Use: Low Risk  (03/09/2024)    Readmission Risk Interventions    01/29/2024   11:53 AM  Readmission Risk Prevention Plan  Post Dischage Appt Complete  Medication Screening Complete  Transportation Screening Complete

## 2024-03-15 NOTE — Progress Notes (Signed)
 PHARMACY - ANTICOAGULATION CONSULT NOTE  Pharmacy Consult:  Heparin  / Coumadin Indication: Persistent dural venous sinus thrombus  No Known Allergies  Patient Measurements: Height: 5\' 4"  (162.6 cm) Weight: 65 kg (143 lb 4.8 oz) IBW/kg (Calculated) : 59.2 HEPARIN  DW (KG): 65  Vital Signs: Temp: 97.6 F (36.4 C) (05/05 0350) Temp Source: Axillary (05/05 0350) BP: 116/77 (05/05 0600) Pulse Rate: 48 (05/05 0000)  Labs: Recent Labs    03/13/24 0602 03/13/24 1753 03/14/24 0415 03/15/24 0500  HGB 13.6  --  13.2 13.5  HCT 42.1  --  40.8 40.0  PLT 241  --  248 255  APTT  --  53* 80* 156*  LABPROT  --  15.2 15.0 22.6*  INR  --  1.2 1.2 2.0*  HEPARINUNFRC  --   --  >1.10* >1.10*  CREATININE 0.95  --  1.01 0.77    Estimated Creatinine Clearance: 108.9 mL/min (by C-G formula based on SCr of 0.77 mg/dL).   Assessment: 3 YOM with history of sinus thrombosis on Eliquis  PTA. He returned with new onset of R-sided weakness and cerebral edema.  Patient was initially on IV heparin , then transitioned back to PTA Eliquis .  Pharmacy consulted to transition patient to IV heparin  bridge to Coumadin starting 5/3.   Last Eliquis  dose was on 5/2 at 2118, so will use aPTT to guide heparin  dosing until heparin  level and PTT correlate.   Not eating well.  CBC stable; no bleeding reported.  aPTT supratherapeutic twice this AM (156, 161 on repeat) and INR now therapeutic.  Patient will need to be on both heparin  and warfarin until INR therapeutic for at least 24 hours.  Of note, patient has drug interaction with valproic acid which may enhance the effects of warfarin.   Goal of Therapy:  INR 2-3 Heparin  level 0.3-0.5 units/ml aPTT 66-85 seconds Monitor platelets by anticoagulation protocol: Yes   Plan:   Hold heparin  infusion for 30 minutes and reduce rate to 900 units/hr Reduce warfarin to 2.5 mg PO today x1 Recheck aPTT in 6 hours  Daily heparin  level, aPTT, PT / INR and CBC  Patience Bonito, PharmD, BCPS, BCCCP Clinical Pharmacist

## 2024-03-15 NOTE — Progress Notes (Signed)
 PHARMACY - ANTICOAGULATION CONSULT NOTE  Pharmacy Consult:  Heparin  / Coumadin Indication: Persistent dural venous sinus thrombus  No Known Allergies  Patient Measurements: Height: 5\' 4"  (162.6 cm) Weight: 65 kg (143 lb 4.8 oz) IBW/kg (Calculated) : 59.2 HEPARIN  DW (KG): 65  Vital Signs: Temp: 97.9 F (36.6 C) (05/05 1536) Temp Source: Oral (05/05 1536) BP: 122/87 (05/05 1700) Pulse Rate: 59 (05/05 1500)  Labs: Recent Labs    03/13/24 0602 03/13/24 0602 03/13/24 1753 03/14/24 0415 03/15/24 0500 03/15/24 0730 03/15/24 1710  HGB 13.6  --   --  13.2 13.5  --   --   HCT 42.1  --   --  40.8 40.0  --   --   PLT 241  --   --  248 255  --   --   APTT  --    < > 53* 80* 156* 161* 98*  LABPROT  --   --  15.2 15.0 22.6*  --   --   INR  --   --  1.2 1.2 2.0*  --   --   HEPARINUNFRC  --   --   --  >1.10* >1.10*  --   --   CREATININE 0.95  --   --  1.01 0.77  --   --    < > = values in this interval not displayed.    Estimated Creatinine Clearance: 108.9 mL/min (by C-G formula based on SCr of 0.77 mg/dL).   Assessment: 20 YOM with history of sinus thrombosis on Eliquis  PTA. He returned with new onset of R-sided weakness and cerebral edema.  Patient was initially on IV heparin , then transitioned back to PTA Eliquis .  Pharmacy consulted to transition patient to IV heparin  bridge to Coumadin starting 5/3.   Last Eliquis  dose was on 5/2 at 2118, so will use aPTT to guide heparin  dosing until heparin  level and PTT correlate.   Not eating well.  CBC stable; no bleeding reported.  Repeat aPTT is therapeutic but above lower goal range, no bleeding concerns so far this shift. Planning to stop IV heparin  after INR therapeutic x24h.  Goal of Therapy:  INR 2-3 Heparin  level 0.3-0.5 units/ml aPTT 66-85 seconds Monitor platelets by anticoagulation protocol: Yes   Plan:   Reduce heparin  to 750 units/h Repeat coags in am  Levin Reamer, PharmD, BCPS, [facility] Clinical  Pharmacist [phone] Please check AMION for all [facility] Pharmacy numbers 03/15/2024

## 2024-03-16 ENCOUNTER — Encounter: Admitting: Occupational Therapy

## 2024-03-16 ENCOUNTER — Ambulatory Visit: Admitting: Physical Therapy

## 2024-03-16 DIAGNOSIS — Z7901 Long term (current) use of anticoagulants: Secondary | ICD-10-CM

## 2024-03-16 DIAGNOSIS — G936 Cerebral edema: Secondary | ICD-10-CM | POA: Diagnosis not present

## 2024-03-16 DIAGNOSIS — R1310 Dysphagia, unspecified: Secondary | ICD-10-CM | POA: Diagnosis not present

## 2024-03-16 DIAGNOSIS — G08 Intracranial and intraspinal phlebitis and thrombophlebitis: Secondary | ICD-10-CM | POA: Diagnosis not present

## 2024-03-16 DIAGNOSIS — E785 Hyperlipidemia, unspecified: Secondary | ICD-10-CM | POA: Diagnosis not present

## 2024-03-16 LAB — BASIC METABOLIC PANEL WITH GFR
Anion gap: 8 (ref 5–15)
BUN: 11 mg/dL (ref 6–20)
CO2: 20 mmol/L — ABNORMAL LOW (ref 22–32)
Calcium: 9.5 mg/dL (ref 8.9–10.3)
Chloride: 111 mmol/L (ref 98–111)
Creatinine, Ser: 1.08 mg/dL (ref 0.61–1.24)
GFR, Estimated: >60 mL/min (ref >60)
Glucose, Bld: 117 mg/dL — ABNORMAL HIGH (ref 70–99)
Potassium: 3.1 mmol/L — ABNORMAL LOW (ref 3.5–5.1)
Sodium: 139 mmol/L (ref 135–145)

## 2024-03-16 LAB — PROTIME-INR
INR: 2.9 — ABNORMAL HIGH (ref 0.8–1.2)
Prothrombin Time: 30.8 s — ABNORMAL HIGH (ref 11.4–15.2)

## 2024-03-16 LAB — CBC
HCT: 39.8 % (ref 39.0–52.0)
Hemoglobin: 13.4 g/dL (ref 13.0–17.0)
MCH: 26.9 pg (ref 26.0–34.0)
MCHC: 33.7 g/dL (ref 30.0–36.0)
MCV: 79.9 fL — ABNORMAL LOW (ref 80.0–100.0)
Platelets: 244 10*3/uL (ref 150–400)
RBC: 4.98 MIL/uL (ref 4.22–5.81)
RDW: 12.6 % (ref 11.5–15.5)
WBC: 8.5 10*3/uL (ref 4.0–10.5)
nRBC: 0 % (ref 0.0–0.2)

## 2024-03-16 LAB — APTT: aPTT: 95 s — ABNORMAL HIGH (ref 24–36)

## 2024-03-16 LAB — GLUCOSE, CAPILLARY
Glucose-Capillary: 122 mg/dL — ABNORMAL HIGH (ref 70–99)
Glucose-Capillary: 141 mg/dL — ABNORMAL HIGH (ref 70–99)
Glucose-Capillary: 118 mg/dL — ABNORMAL HIGH (ref 70–99)
Glucose-Capillary: 142 mg/dL — ABNORMAL HIGH (ref 70–99)

## 2024-03-16 LAB — HEPARIN LEVEL (UNFRACTIONATED): Heparin Unfractionated: 0.64 [IU]/mL (ref 0.30–0.70)

## 2024-03-16 MED ORDER — POTASSIUM CHLORIDE 20 MEQ PO PACK
40.0000 meq | PACK | ORAL | Status: AC
  Administered 2024-03-16: 40 meq via ORAL
  Filled 2024-03-16: qty 2

## 2024-03-16 MED ORDER — ALUM & MAG HYDROXIDE-SIMETH 200-200-20 MG/5ML PO SUSP
30.0000 mL | ORAL | Status: DC | PRN
  Administered 2024-03-16: 30 mL via ORAL
  Filled 2024-03-16: qty 30

## 2024-03-16 MED ORDER — POTASSIUM CHLORIDE CRYS ER 10 MEQ PO TBCR
40.0000 meq | EXTENDED_RELEASE_TABLET | Freq: Once | ORAL | Status: AC
  Administered 2024-03-16: 40 meq via ORAL
  Filled 2024-03-16: qty 4

## 2024-03-16 NOTE — PMR Pre-admission (Signed)
 PMR Admission Coordinator Pre-Admission Assessment  Patient: Samuel Warren is an 33 y.o., male MRN: [id number] DOB: 04-12-1991 Height: 5\' 4"  (162.6 cm) Weight: 65 kg  Insurance Information HMO: ***    PPO: ***     PCP:      IPA:      80/20:      OTHER:  PRIMARY: [employer]       Policy#: [id number]       Subscriber: pt CM Name: ***      Phone#: ***     Fax#: *** Pre-Cert#: ***      Employer: *** Benefits:  Phone #: ***     Name: *** Venson Ginger. Date: ***     Deduct: ***      Out of Pocket Max: ***      Life Max: *** CIR: ***      SNF: *** Outpatient: ***     Co-Pay: *** Home Health: ***      Co-Pay: *** DME: ***     Co-Pay: *** Providers: *** SECONDARY:       Policy#:      Phone#:   Financial Counselor:       Phone#:   The "Data Collection Information Summary" for patients in Inpatient Rehabilitation Facilities with attached "Privacy Act Statement-Health Care Records" was provided and verbally reviewed with: Patient and Family  Emergency Contact Information Contact Information     Name Relation Home Work Coronita Brother   [phone]      Other Contacts     Name Relation Home Work Mobile   Royston Other   [phone]   HIU,EM Father   [phone]       Current Medical History  Patient Admitting Diagnosis: dural venous sinus thrombosis  History of Present Illness: ***  Complete NIHSS TOTAL: 6  Patient's medical record from [facility] has been reviewed by the rehabilitation admission coordinator and physician.  Past Medical History  Past Medical History:  Diagnosis Date   Deaf     Has the patient had major surgery during 100 days prior to admission? Yes  Family History   family history is not on file.  Current Medications  Current Facility-Administered Medications:    acetaminophen  (TYLENOL ) tablet 650 mg, 650 mg, Oral, Q4H PRN, 650 mg at 03/16/24 0903 **OR** acetaminophen  (TYLENOL ) 160 MG/5ML solution 650 mg, 650 mg, Per Tube, Q4H PRN **OR**  acetaminophen  (TYLENOL ) suppository 650 mg, 650 mg, Rectal, Q4H PRN, Deveshwar, Sanjeev, MD   alum & mag hydroxide-simeth (MAALOX/MYLANTA) 200-200-20 MG/5ML suspension 30 mL, 30 mL, Oral, Q4H PRN, Haydee Lipa, MD, 30 mL at 03/16/24 3295   atorvastatin  (LIPITOR ) tablet 80 mg, 80 mg, Oral, Daily, Arne Langdon, MD, 80 mg at 03/16/24 1000   butalbital -acetaminophen -caffeine  (FIORICET ) 50-325-40 MG per tablet 1 tablet, 1 tablet, Oral, Q8H PRN, Consuelo Denmark, MD, 1 tablet at 03/13/24 1022   Chlorhexidine  Gluconate Cloth 2 % PADS 6 each, 6 each, Topical, Q0600, Desai, Nikita S, MD, 6 each at 03/16/24 0617   feeding supplement (ENSURE ENLIVE / ENSURE PLUS) liquid 237 mL, 237 mL, Oral, BID BM, Haydee Lipa, MD, 237 mL at 03/16/24 1000   iohexol  (OMNIPAQUE ) 300 MG/ML solution 150 mL, 150 mL, Intra-arterial, Once PRN, Deveshwar, Sanjeev, MD   methylPREDNISolone (MEDROL DOSEPAK) tablet 4 mg, 4 mg, Oral, 4X daily taper, Dang, Thuy D, RPH, 4 mg at 03/16/24 0800   ondansetron  (ZOFRAN ) tablet 4 mg, 4 mg, Oral, Q6H PRN, Arne Langdon, MD   Oral  care mouth rinse, 15 mL, Mouth Rinse, PRN, Mannam, Praveen, MD   pantoprazole  (PROTONIX ) EC tablet 40 mg, 40 mg, Oral, Daily, Arne Langdon, MD, 40 mg at 03/16/24 1000   polyethylene glycol (MIRALAX  / GLYCOLAX ) packet 17 g, 17 g, Oral, BID, Haydee Lipa, MD, 17 g at 03/16/24 1124   senna (SENOKOT) tablet 8.6 mg, 1 tablet, Oral, BID, Sethi, Pramod S, MD, 8.6 mg at 03/16/24 1000   sodium chloride  tablet 2 g, 2 g, Oral, TID WC, Consuelo Denmark, MD, 2 g at 03/16/24 1130   valproate (DEPACON) 500 mg in dextrose  5 % 50 mL IVPB, 500 mg, Intravenous, Q6H, Imogene Mana, NP, Stopped at 03/16/24 1610   Warfarin - Pharmacist Dosing Inpatient, , Does not apply, q1600, Dang, Thuy D, RPH, 2.5 each at 03/15/24 1638  Patients Current Diet:  Diet Order             DIET DYS 3 Room service appropriate? Yes; Fluid consistency: Thin  Diet effective now                    Precautions / Restrictions Precautions Precautions: Fall, Other (comment) Precaution/Restrictions Comments: R cervical rotation and some R inattention Restrictions Weight Bearing Restrictions Per Provider Order: No   Has the patient had 2 or more falls or a fall with injury in the past year? Yes  Prior Activity Level Limited Community (1-2x/wk): was receiving outpatient therapy immediately prior to admit, but before recent hospitalizations was independent and working  Prior Functional Level Self Care: Did the patient need help bathing, dressing, using the toilet or eating? Needed some help  Indoor Mobility: Did the patient need assistance with walking from room to room (with or without device)? Needed some help  Stairs: Did the patient need assistance with internal or external stairs (with or without device)? Needed some help  Functional Cognition: Did the patient need help planning regular tasks such as shopping or remembering to take medications? Needed some help  Patient Information Are you of Hispanic, Latino/a,or Spanish origin?: A. No, not of Hispanic, Latino/a, or Spanish origin What is your race?: I. Vietnamese Do you need or want an interpreter to communicate with a doctor or health care staff?: 1. Yes Sherline Distel and sign language (call [phone] for interpreter who has worked with patient for multiple hospitalizations))  Patient's Response To:  Health Literacy and Transportation Is the patient able to respond to health literacy and transportation needs?: No  Home Assistive Devices / Equipment Home Equipment: Agricultural consultant (2 wheels)  Prior Device Use: Indicate devices/aids used by the patient prior to current illness, exacerbation or injury? None of the above  Current Functional Level Cognition  Orientation Level: (P) Other (comment) (nonverbal; deaf)    Extremity Assessment (includes Sensation/Coordination)  Upper Extremity Assessment: RUE  deficits/detail RUE Deficits / Details: 3/5 lifts against gravity, grip is improving RUE Sensation: decreased light touch, decreased proprioception RUE Coordination: decreased fine motor, decreased gross motor  Lower Extremity Assessment: Defer to PT evaluation    ADLs  Overall ADL's : Needs assistance/impaired Eating/Feeding: Minimal assistance, Sitting Grooming: Wash/dry face, Set up, Sitting Upper Body Bathing: Moderate assistance, Sitting, Bed level Lower Body Bathing: Maximal assistance, Sitting/lateral leans, Bed level Upper Body Dressing : Moderate assistance, Sitting, Bed level Lower Body Dressing: Maximal assistance, Sitting/lateral leans, Sit to/from stand Toilet Transfer: Maximal assistance, +2 for physical assistance, Moderate assistance Toileting- Clothing Manipulation and Hygiene: Maximal assistance Functional mobility during ADLs: Moderate assistance, Maximal assistance, +2 for  physical assistance    Mobility  Overal bed mobility: Needs Assistance Bed Mobility: Supine to Sit Sidelying to sit: Mod assist, +2 for physical assistance, HOB elevated Supine to sit: Mod assist, HOB elevated Sit to supine: Max assist, +2 for physical assistance, HOB elevated General bed mobility comments: Increased assist returning to supine due to pt fatigue    Transfers  Overall transfer level: Needs assistance Equipment used: 2 person hand held assist, Rolling walker (2 wheels) Transfers: Sit to/from Stand, Bed to chair/wheelchair/BSC Sit to Stand: +2 physical assistance, Max assist Bed to/from chair/wheelchair/BSC transfer type:: Step pivot Step pivot transfers: Mod assist, +2 physical assistance General transfer comment: posterior lean throughout and R knee blocked while pt stepping with LLE due to RLE weakness/buckling. Manual assist to weight shift for offloading pressure when side stepping with opposite leg.    Ambulation / Gait / Stairs / Wheelchair Mobility   Ambulation/Gait Ambulation/Gait assistance: Mod assist, Max assist, +2 physical assistance Gait Distance (Feet): 4 Feet Assistive device: Rolling walker (2 wheels) Gait Pattern/deviations: Step-to pattern, Decreased stride length, Decreased dorsiflexion - right, Decreased step length - right General Gait Details: pt walking ~4' with RW and mod-maxA+2, chair follow. pt with R lateral lean though improved with forward weight translation, initially taking complete steps bilaterally though requiring increased cues to step with RLE; seated rest due to fatigue Gait velocity: Decreased Pre-gait activities: a few shuffled steps toward his L side with +2 maxA see transfers section    Posture / Balance Dynamic Sitting Balance Sitting balance - Comments: R lateral and posterior lean, only briefly improves with seated anterior reaching activity Balance Overall balance assessment: Needs assistance Sitting-balance support: No upper extremity supported, Feet unsupported Sitting balance-Leahy Scale: Poor Sitting balance - Comments: R lateral and posterior lean, only briefly improves with seated anterior reaching activity Postural control: Right lateral lean, Left lateral lean, Posterior lean Standing balance support: Bilateral upper extremity supported Standing balance-Leahy Scale: Zero Standing balance comment: +2 modA static standing, progressing to +2 maxA for dynamic standing/weight shifting tasks    Special needs/care consideration Special service needs interpreters Sherline Distel and Sign Language)   Previous Home Environment (from acute therapy documentation) Living Arrangements: Parent, Other relatives Available Help at Discharge: Family, Available 24 hours/day Type of Home: House Home Layout: One level Home Access: Stairs to enter Entrance Stairs-Rails: Left, Right Entrance Stairs-Number of Steps: 7 Bathroom Shower/Tub: Engineer, manufacturing systems: Standard  Discharge Living Setting Plans for  Discharge Living Setting: Patient's home, Lives with (comment) (8 family members (parents, spouse, siblings)) Type of Home at Discharge: House Discharge Home Layout: One level Discharge Home Access: Stairs to enter Entrance Stairs-Rails: Left, Right Entrance Stairs-Number of Steps: 7 Discharge Bathroom Shower/Tub: Tub/shower unit Discharge Bathroom Toilet: Standard Discharge Bathroom Accessibility: No Does the patient have any problems obtaining your medications?: No  Social/Family/Support Systems Patient Roles: Parent Contact Information: 1 y/o daughter Anticipated Caregiver: spouse and other family members Anticipated Industrial/product designer Information: spouse usually at bedside (needs [place] interpreter), brother Ola Berger speaks English for detailed convos  [phone] Ability/Limitations of Caregiver: none stated, good family support Caregiver Availability: 24/7 Discharge Plan Discussed with Primary Caregiver: Yes Is Caregiver In Agreement with Plan?: Yes Does Caregiver/Family have Issues with Lodging/Transportation while Pt is in Rehab?: No  Goals Patient/Family Goal for Rehab: PT/OT min assist, SLP min assist Expected length of stay: 18-21 days Cultural Considerations: pt is from [place] community [place]) and deaf, has his own sign language he uses with his family Additional  Information: Discharge plan: home with family 24/7, brother is primary contact for detailed communication, spouse is usually at bedside and needs interpreter Pt/Family Agrees to Admission and willing to participate: Yes Program Orientation Provided & Reviewed with Pt/Caregiver Including Roles  & Responsibilities: Yes  Decrease burden of Care through IP rehab admission: n/a  Possible need for SNF placement upon discharge:  Not anticipated.  Plan for discharge home with 24/7 support from multiple family members  Patient Condition: I have reviewed medical records from [facility], spoken with CM, and patient,  spouse, and family member. I met with patient at the bedside and discussed via phone for inpatient rehabilitation assessment.  Patient will benefit from ongoing PT, OT, and SLP, can actively participate in 3 hours of therapy a day 5 days of the week, and can make measurable gains during the admission.  Patient will also benefit from the coordinated team approach during an Inpatient Acute Rehabilitation admission.  The patient will receive intensive therapy as well as Rehabilitation physician, nursing, social worker, and care management interventions.  Due to safety, skin/wound care, disease management, medication administration, pain management, and patient education the patient requires 24 hour a day rehabilitation nursing.  The patient is currently max +2 with mobility and basic ADLs.  Discharge setting and therapy post discharge at home with home health is anticipated.  Patient has agreed to participate in the Acute Inpatient Rehabilitation Program and will admit ***.  Preadmission Screen Completed By:  Mickey Alar, PT, DPT 03/16/2024 11:51 AM ______________________________________________________________________   Discussed status with Dr. Aaron Aas on *** at *** and received approval for admission today.  Admission Coordinator:  Caitlin E Warren, PT, DPT time Aaron AasAlanna Hu ***   Assessment/Plan: Diagnosis: *** Does the need for close, 24 hr/day Medical supervision in concert with the patient's rehab needs make it unreasonable for this patient to be served in a less intensive setting? {yes_no_potentially:3041433} Co-Morbidities requiring supervision/potential complications: *** Due to {due NG:2952841}, does the patient require 24 hr/day rehab nursing? {yes_no_potentially:3041433} Does the patient require coordinated care of a physician, rehab nurse, PT, OT, and SLP to address physical and functional deficits in the context of the above medical diagnosis(es)? {yes_no_potentially:3041433} Addressing deficits  in the following areas: {deficits:3041436} Can the patient actively participate in an intensive therapy program of at least 3 hrs of therapy 5 days a week? {yes_no_potentially:3041433} The potential for patient to make measurable gains while on inpatient rehab is {potential:3041437} Anticipated functional outcomes upon discharge from inpatient rehab: {functional outcomes:304600100} PT, {functional outcomes:304600100} OT, {functional outcomes:304600100} SLP Estimated rehab length of stay to reach the above functional goals is: *** Anticipated discharge destination: {anticipated dc setting:21604} 10. Overall Rehab/Functional Prognosis: {potential:3041437}   MD Signature: ***

## 2024-03-16 NOTE — Progress Notes (Signed)
 Physical Therapy Treatment Patient Details Name: Samuel Warren MRN: [id number] DOB: [birth year] Today's Date: 03/16/2024   History of Present Illness 33 y.o. male admitted 03/09/24 with R-side weakness. Head CT with unchanged small subacute L frontal infarct. CTA with worsening dural venous sinus thrombus. S/p bilateral common carotid angiograms, internal jugular venograms and unsuccessful thrombectomy on 4/29. EEG 4/30 without seizures. 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    PT Comments  Pt tolerates treatment well despite reports of lower abdominal pain. ASL interpreter present for patient and Sherline Distel interpreter present for pt's spouse. Pt appears to demonstrate a less significant posterior and R lateral lean when sitting, however a posterior lean is persistent with multiple standing attempts. PT provides multiple cues to attempt to increase anterior weight shift when standing. Patient will benefit from intensive inpatient follow-up therapy, >3 hours/day.    If plan is discharge home, recommend the following: A lot of help with walking and/or transfers;A lot of help with bathing/dressing/bathroom;Assistance with cooking/housework;Assist for transportation;Help with stairs or ramp for entrance   Can travel by private vehicle        Equipment Recommendations  Wheelchair (measurements PT);Wheelchair cushion (measurements PT);Other (comment) (would benefit from a custom wheelchair currently but hopeful sitting balance continues to improve)    Recommendations for Other Services       Precautions / Restrictions Precautions Precautions: Fall;Other (comment) Recall of Precautions/Restrictions: Intact Precaution/Restrictions Comments: R cervical rotation and some R inattention Restrictions Weight Bearing Restrictions Per Provider Order: No     Mobility  Bed Mobility Overal bed mobility: Needs Assistance Bed Mobility:  Supine to Sit, Sit to Supine     Supine to sit: Mod assist, HOB elevated Sit to supine: Max assist        Transfers Overall transfer level: Needs assistance Equipment used: 1 person hand held assist Transfers: Sit to/from Stand Sit to Stand: Mod assist           General transfer comment: pt stands 3 times, posterior lean present for all 3 attempts and rightward lean present for 2. PT provides cues via ASL interpreter for foot placement and increased anterior weight shift. One standing attempt with RW with slight improvement in posterior lean    Ambulation/Gait             Pre-gait activities: maxA to side step to left side for 2'     Stairs             Wheelchair Mobility     Tilt Bed    Modified Rankin (Stroke Patients Only)       Balance Overall balance assessment: Needs assistance Sitting-balance support: Single extremity supported, Feet supported Sitting balance-Leahy Scale: Poor Sitting balance - Comments: intermittent minA due to posterior lean Postural control: Posterior lean Standing balance support: Bilateral upper extremity supported Standing balance-Leahy Scale: Poor Standing balance comment: modA, posterior and right lateral lean                            Communication Communication Communication: Impaired Factors Affecting Communication: Hearing impaired;Other (comment) (ASL interpreter present)  Cognition Arousal: Alert Behavior During Therapy: WFL for tasks assessed/performed   PT - Cognitive impairments: Difficult to assess Difficult to assess due to: Hard of hearing/deaf, Non-English speaking                     PT - Cognition Comments: pt follows commands with increased  time and use of ASL interpreter. Difficult to assess pt's full understanding of situation as the pt does not utilize formal ASL. Following commands: Impaired Following commands impaired: Follows one step commands with increased time     Cueing Cueing Techniques: Gestural cues, Tactile cues, Visual cues  Exercises      General Comments General comments (skin integrity, edema, etc.): VSS on RA      Pertinent Vitals/Pain Pain Assessment Pain Assessment: Faces Faces Pain Scale: Hurts even more Pain Location: lower abdomen Pain Descriptors / Indicators: Cramping Pain Intervention(s): Monitored during session    Home Living                          Prior Function            PT Goals (current goals can now be found in the care plan section) Acute Rehab PT Goals Patient Stated Goal: not stated Progress towards PT goals: Progressing toward goals    Frequency    Min 3X/week      PT Plan      Co-evaluation              AM-PAC PT "6 Clicks" Mobility   Outcome Measure  Help needed turning from your back to your side while in a flat bed without using bedrails?: A Lot Help needed moving from lying on your back to sitting on the side of a flat bed without using bedrails?: A Lot Help needed moving to and from a bed to a chair (including a wheelchair)?: A Lot Help needed standing up from a chair using your arms (e.g., wheelchair or bedside chair)?: A Lot Help needed to walk in hospital room?: Total Help needed climbing 3-5 steps with a railing? : Total 6 Click Score: 10    End of Session Equipment Utilized During Treatment: Gait belt Activity Tolerance: Patient tolerated treatment well Patient left: in bed;with call bell/phone within reach;with bed alarm set Nurse Communication: Mobility status;Need for lift equipment PT Visit Diagnosis: Other abnormalities of gait and mobility (R26.89);Muscle weakness (generalized) (M62.81)     Time: 1610-9604 PT Time Calculation (min) (ACUTE ONLY): 27 min  Charges:    $Therapeutic Activity: 23-37 mins PT General Charges $$ ACUTE PT VISIT: 1 Visit                     Rexie Catena, PT, DPT Acute Rehabilitation Office [phone]    Rexie Catena 03/16/2024, 3:58 PM

## 2024-03-16 NOTE — Progress Notes (Signed)
 Speech Language Pathology Treatment: Dysphagia  Patient Details Name: Samuel Warren MRN: [id number] DOB: Oct 09, 1991 Today's Date: 03/16/2024 Time: 3664-4034 SLP Time Calculation (min) (ACUTE ONLY): 8 min  Assessment / Plan / Recommendation Clinical Impression  Observed pt feed himself trials of thin liquids and solids with some delayed coughing noted initially. This did not recur when challenged with large consecutive straw sips of water and RN denies observing this throughout the day. Mastication is prolonged but thorough and he eventually achieves complete oral clearance. Recommend upgrading diet to regular solids and continuing thin liquids. Discussed with RN, who assisted with [place] interpreting with pt's wife who assisted with ASL interpreting to pt. Encouraged pt's wife to bring in desired foods from home to encourage improved intake. SLP will continue to follow.    HPI HPI: Patient is a 33 y.o. male with PMH: deafness (no formal language system), GERD, CVA, venous sinus thrombosis, meningoencephalitis, recently discharged from [facility] on 03/05/24 for right sided weakness. He returned to the ED o n 03/08/24 after being found down with new onset right sided weakness. MRI brain which showed persistent findings of extensive dural venous sinus thrombosis and Evolving subacute versus venous ischemic changes were also noted. He has been NPO awaiting SLP swallow evaluation.      SLP Plan  Continue with current plan of care      Recommendations for follow up therapy are one component of a multi-disciplinary discharge planning process, led by the attending physician.  Recommendations may be updated based on patient status, additional functional criteria and insurance authorization.    Recommendations  Diet recommendations: Regular;Thin liquid Liquids provided via: Cup;Straw Medication Administration: Whole meds with liquid Supervision: Patient able to self feed;Intermittent supervision to cue for  compensatory strategies Compensations: Slow rate;Small sips/bites;Minimize environmental distractions Postural Changes and/or Swallow Maneuvers: Seated upright 90 degrees                  Oral care BID;Staff/trained caregiver to provide oral care   Intermittent Supervision/Assistance Dysphagia, unspecified (R13.10)     Continue with current plan of care     Amil Kale, M.A., CCC-SLP Speech Language Pathology, Acute Rehabilitation Services  Secure Chat preferred [phone]   03/16/2024, 4:18 PM

## 2024-03-16 NOTE — Progress Notes (Addendum)
 Inpatient Rehab Coordinator Note:  I met with patient and his spouse at bedside (RN interpreting) with brother over the phone to discuss CIR recommendations and goals/expectations of CIR stay.  We reviewed 3 hrs/day of therapy, physician follow up, and average length of stay 2 weeks (dependent upon progress) with goals of 24/7 physical assist.  Spouse confirms she is home and available to provide expected level of care.  Brother also supportive.  We reviewed insurance auth process and I will start that request today.   Loye Rumble, PT, DPT Admissions Coordinator [phone] 03/16/24  11:42 AM

## 2024-03-16 NOTE — Progress Notes (Addendum)
 STROKE TEAM PROGRESS NOTE   INTERIM HISTORY/SUBJECTIVE His wife is at the bedside.    Patient is still having headaches, but improving and appears quite comfortable.  He is on on Medrol Dosepak and Depacon.Aaron Aas  He is not transferred to a stepdown bed.   INR is optimal at 2.9 and IV heparin  has been discontinued. CT head yesterday showed stable appearance without significant hemorrhage or worsening OBJECTIVE  CBC    Component Value Date/Time   WBC 8.5 03/16/2024 0528   RBC 4.98 03/16/2024 0528   HGB 13.4 03/16/2024 0528   HCT 39.8 03/16/2024 0528   PLT 244 03/16/2024 0528   MCV 79.9 (L) 03/16/2024 0528   MCH 26.9 03/16/2024 0528   MCHC 33.7 03/16/2024 0528   RDW 12.6 03/16/2024 0528   LYMPHSABS 1.5 03/10/2024 1032   MONOABS 1.0 03/10/2024 1032   EOSABS 0.0 03/10/2024 1032   BASOSABS 0.0 03/10/2024 1032    BMET    Component Value Date/Time   NA 139 03/16/2024 0528   K 3.1 (L) 03/16/2024 0528   CL 111 03/16/2024 0528   CO2 20 (L) 03/16/2024 0528   GLUCOSE 117 (H) 03/16/2024 0528   BUN 11 03/16/2024 0528   CREATININE 1.08 03/16/2024 0528   CALCIUM  9.5 03/16/2024 0528   GFRNONAA >60 03/16/2024 0528    IMAGING past 24 hours CT HEAD POST STROKE FOLLOWUP/TIMED/STAT READ Result Date: 03/15/2024 CLINICAL DATA:  Code stroke. Neuro deficit, acute, stroke suspected. EXAM: CT HEAD WITHOUT CONTRAST TECHNIQUE: Contiguous axial images were obtained from the base of the skull through the vertex without intravenous contrast. RADIATION DOSE REDUCTION: This exam was performed according to the departmental dose-optimization program which includes automated exposure control, adjustment of the mA and/or kV according to patient size and/or use of iterative reconstruction technique. COMPARISON:  Prior head CT examinations 03/09/2024 and earlier. CT venogram head 03/08/2024. Prior brain MRI examinations 03/08/2024 and earlier. FINDINGS: Brain: Known subacute foci of venous parenchymal ischemia within the  medial frontal lobes (left greater than right), not appreciably changed in extent from the prior brain MRI of 03/08/2024. A small focus of subacute venous parenchymal ischemia within the left occipital lobe was better appreciated on the prior MRI of 03/08/2024. No evidence of frank hemorrhagic conversion. Known cerebellar tonsillar ectopia, incompletely imaged and incompletely assessed. No interval acute infarct is identified. No extra-axial fluid collection. No evidence of an intracranial mass. No midline shift. Vascular: No hyperdense arterial vessel. Some areas of persistent hyperdensity in the venous sinuses consistent with known venous thrombotic disease. Skull: No calvarial fracture or aggressive osseous lesion. Sinuses/Orbits: No mass or acute finding within the imaged orbits. Mild mucosal thickening scattered within the paranasal sinuses at the imaged levels. IMPRESSION: 1. No significant change as compared to the non-contrast head CT of 03/09/2024. 2. Known foci of subacute venous parenchymal ischemia within the medial frontal lobes and left occipital lobe, some of which were better appreciated on the prior MRI of 03/08/2024. No evidence of frank hemorrhagic conversion. 3. Some areas of persistent hyperdensity in the venous sinuses consistent with known venous thrombotic disease. 4. Known cerebellar tonsillar ectopia, incompletely imaged and incompletely assessed. 5. Mild paranasal sinus mucosal thickening at the imaged levels. Electronically Signed   By: Bascom Lily D.O.   On: 03/15/2024 16:33     Vitals:   03/15/24 1941 03/15/24 2311 03/16/24 0450 03/16/24 0854  BP: 122/89 110/85 125/85 134/85  Pulse: (!) 49 (!) 52 (!) 57 (!) 54  Resp: 10 13 18  14  Temp: 98.2 F (36.8 C) 98.6 F (37 C) 98 F (36.7 C) 98 F (36.7 C)  TempSrc: Oral Oral Oral Oral  SpO2: 99% 97% 97% 98%  Weight:      Height:         PHYSICAL EXAM General:  Alert, well-nourished, well-developed patient in no acute  distress Psych:  Mood and affect appropriate for situation CV: Regular rate and rhythm on monitor Respiratory:  Regular, unlabored respirations on room air GI: Abdomen soft and nontender NEURO:  Pt is awake, alert, eyes open, smile to provider. No gaze palsy, tracking bilaterally, blinking to visual threat bilaterally, PERRL. No facial droop. Tongue midline. Bilateral UEs 5/5, no drift. Bilaterally LEs 5/5, no drift. b/l FTN intact, gait not tested.     ASSESSMENT/PLAN  Mr. Sylas Abila is a 34 y.o. 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 CT head with no acute process. 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 CTA and CTV obtained. Heparin  gtt initiated. NIH on Admission 25  Worsening mental status, improved Patient seems more drowsy sleepy, hard to arouse on 4/29 CT repeat 4/29 showed increased cerebral edema Transferred to ICU for 3% saline with bolus S/p IR but not successful with IR Dr. Alvira Josephs Mental status much improved, back to basel ine now Tape down 3% saline  On LTM EEG - no seizure -> d/c  Persistent dural venous sinus thrombus:   Code Stroke CT head -  Unchanged small subacute left frontal lobe infarct in the setting of known dural venous sinus thrombosis. ASPECTS of 10. CTA head & neck No LVO CT Venogram Mild progression of extensive thrombus in the superior sagittal sinus posteriorly. Mild improvement of subocclusive thrombus in the  transverse and sigmoid sinuses bilaterally. Persistent bilateral cavernous sinus thrombosis. MRI  Evolving subacute venous ischemic changes involving the paramedian frontal lobes and likely the left occipital lobe, not significantly changed as compared to previous. Mild associated petechial hemorrhage at the posterior left frontal region, stable from  prior.  4/29- Stat Head CT generalized brain swelling.  On personal review, seems more cerebral edema than 4/28 CT head 03/15/2024-no significant change compared with CT 03/09/2024 3/18- 2D Echo - EF 45-50% in 01/2024-EEG- negative for seizure, mild to moderate diffuse encephalopathy LDL 58 HgbA1c 5.7 VTE prophylaxis - Heparin  IV Eliquis  (apixaban ) daily prior to admission, now on eliquis   Therapy recommendations:  CIR Disposition:  pending  Cerebral edema Transferred to ICU on 4/29 due to decreased LOC Hypertonic saline started at 31ml/hr->40cc -> 20cc for 12 hours and then d/c goal sodium 150-155 Serial neurochecks Q6 Na Na 144 --141--136-146-148-149-146 Add Na tab 2g tid Headaches- likely due to cerebral edema - topamax  stopped 5/1 due to low bicarb.  Put on Diamox  500mg  bid x2 and BMP tomorrow if still low bicarb will need to consider alternatives  Hx of CVST Admitted 02/01/2024 for CVST.  Status post IR for thrombectomy and discharged on Eliquis . Readmitted 03/02/2024 for right-sided weakness.  CT head showed increased left frontal venous infarct.  CTV showed recurrent occlusive and subocclusive thrombi within the SSS posteriorly, extending to the confluence of sinus.  Thrombus extending into the medial aspect of the right transverse sinus.  Clot burden within the right sigmoid sinus has decreased.  Patient was concerning for dehydration at the time, and received fluid resuscitation.  Discharged in good condition  However, per wife, patient discharged on 4/25 Friday, at the night of that day, patient drowsy sleepy not talking.  Saturday some improvement but not eating and drinking well able to take medication.  Sunday she did a poorly, drowsy sleepy not talking not eating, however wife was able to get medication down.  Monday yesterday, he was sent to ER for evaluation.  Hx of Stroke Recent viral meningitis January 27, 2024 right frontal 2-3 punctate infarcts, etiology: Likely due to  infectious vasculitis vs. Small vessel disease in the setting of CNS infection  CSF at that time WBC 22, RBC 1330, protein 91, glucose 80, concerning for viral meningitis Completed course of Doxy No need LP repeat at this time given no sign of CNS infection and especially with cerebral edema and low-lying cerebellar tonsils extending up to 9 mm below the foreman magnum  Hyperlipidemia Home meds:  Atorvastatin  80, resumed LDL in 01/2024 279 This admission LDL 58, goal < 70 Continue statin on discharge  Dysphagia SLP consulted On dys 3 and thin liquid Advance diet as tolerated  Other Active Problems Hypokalemia K 3.2 - 2.9 - supplement -> 3.7  Hospital day # 8     His wife is at the bedside..  .  He continues to have slight improvement in his headache .he is tracking, with body language and pain chart,  . Moving all extremities, no gaze palsy or neglect.  Have added Medrol Dosepak for refractory headache..  Continue IV hydration and  depacon for headache.  INR is optimal today at 2.9..   IV heparin  has been discontinued.  Hopefully transfer to floor and rehab in few days. D/w  Dr. Rudine Cos.  Greater than 50% time during this 35-minute visit was spent in counseling and coordination of care about his refractory headache and cerebral venous sinus thrombosis and discussion with patient and wife and care team and answering questions.   Ardella Beaver, MD  To contact Stroke Continuity provider, please refer to [email or web address]. After hours, contact General Neurology

## 2024-03-16 NOTE — Progress Notes (Signed)
 PHARMACY - ANTICOAGULATION CONSULT NOTE  Pharmacy Consult:  Warfarin (Coumadin) Indication: Persistent dural venous sinus thrombus  No Known Allergies  Patient Measurements: Height: 5\' 4"  (162.6 cm) Weight: 65 kg (143 lb 4.8 oz) IBW/kg (Calculated) : 59.2 HEPARIN  DW (KG): 65  Vital Signs: Temp: 98 F (36.7 C) (05/06 0854) Temp Source: Oral (05/06 0854) BP: 134/85 (05/06 0854) Pulse Rate: 54 (05/06 0854)  Labs: Recent Labs    03/14/24 0415 03/15/24 0500 03/15/24 0730 03/15/24 1710 03/16/24 0528  HGB 13.2 13.5  --   --  13.4  HCT 40.8 40.0  --   --  39.8  PLT 248 255  --   --  244  APTT 80* 156* 161* 98* 95*  LABPROT 15.0 22.6*  --   --  30.8*  INR 1.2 2.0*  --   --  2.9*  HEPARINUNFRC >1.10* >1.10*  --   --  0.64  CREATININE 1.01 0.77  --   --  1.08    Estimated Creatinine Clearance: 80.7 mL/min (by C-G formula based on SCr of 1.08 mg/dL).   Assessment: 44 YOM with history of sinus thrombosis on Eliquis  PTA. He returned with new onset of R-sided weakness and cerebral edema.  Patient was initially on IV heparin , then transitioned back to PTA Eliquis .  Pharmacy consulted to transition patient to IV heparin  bridge to Coumadin starting 5/3.   Last Eliquis  dose was on 5/2 at 2118, so will use aPTT to guide heparin  dosing. INR therapeutic x24 hours so heparin  gtt was discontinued. Pharmacy will continue to follow for warfarin dosing.   aPTT 95 - supratherapeutic / HL 0.64 - supratherapeutic  INR 2.9 - therapeutic  CBC WNL    Goal of Therapy:  INR 2-3 Heparin  level 0.3-0.5 units/ml aPTT 66-85 seconds Monitor platelets by anticoagulation protocol: Yes   Plan:   Stop heparin  infusion and aPTT and HL checks Omit warfarin dose today x1 day given INR 2.9 today Daily INR, CBC  Oralee Billow, PharmD, BCCCP Clinical Pharmacist 03/16/2024 10:51 AM

## 2024-03-16 NOTE — Progress Notes (Signed)
 PROGRESS NOTE    Samuel Warren  OZH:[id number] DOB: 08-Aug-1991 DOA: 03/08/2024 PCP: Alto Atta, NP   Brief Narrative:  Samuel Warren is a 33 y.o. male with medical history significant of deafness, venous sinus thrombosis on eliquis , and meningoencephalitis presented to ED on 4/28 as a code stroke after being found down w/ new onset R sided weakness.  Patient has known cardiovascular disease with multiple CVA and thrombectomies.   Assessment & Plan:   Principal Problem:   Dural venous sinus thrombosis Active Problems:   Acute cerebral venous sinus thrombosis   Right hemiplegia (HCC)  4/28 admitted, code stroke called on him 4/29 Radiology with anesthesia - IR thrombectomy veno mesh-  Unsuccessful   4/30 Continuous EEG unremarkable 5/2 hypertonic saline discontinued at 11pm 5/4 transition to hospitalist medicine team 5/5 Lengthy goals of care discussion in regards to patient's poor p.o. intake.  Wife discussed via translator understands that if he does not improve his intake he will require feeding tube or he will continue to worsen.  Via sign language interpreter he appears to understand that if he does not eat he would need a feeding tube which he agrees with at this time.  Plan for family meeting later this week to discuss short-term and long-term goals for Mr. Cohenour.  Goals of care  Lengthy goals of care discussion as above in regards to poor p.o. intake, discussed possible need for PEG tube wife and patient appeared to be agreeable if necessary.  Discussed case with IR, will need to be able to lie supine for case.  If patient needs PEG placed will also need noncontrast CT abdomen to evaluate anatomical structures.  Chronic dural sinus thrombosis Cerebral edema Acute Encephalopathy Acute intractable headaches - S/p attempt thrombectomy 4/29 without success - transitioning from heparin  gtt to warfarin - Neurology is following  - Hypertonic saline discontinued 5/2, given diamox   5/2 - 2g sodium tabs - Medrol dose pack started 5/4 - Depakote IV ongoing - internal jugular CVC discontinued given appropriate IV access   Protein caloric malnutrition - New onset since admission given refusal of PO intake - Strict I/O - calorie count over the next few days to ensure appropriate intake - If unable to keep up with PO intake will place PEG per discussion with wife(POA) and patient 5/5 as above  Hypokalemia - Secondary to poor PO intake - Replete as necessary (patient refusing PO - transition to IV)   Metabolic Acidosis - low serum bicarb in setting of diamox  adinistration - IV fluids held - increase PO intake as tolerated (I/O and Calorie count as above)  Constipation - Only 2 small BM noted since intake (overnight 4th) - continue current regimen until improvement (Brother noted 2 episodes of diarrhea overnight but this is not documented anywhere/unconfirmed).  DVT prophylaxis: heparin  drip Code Status:   Code Status: Full Code Family Communication: Wife at bedside, [place] translator used  Status is: Inpatient  Dispo: The patient is from: Home              Anticipated d/c is to: To be determined              Anticipated d/c date is: To be determined              Patient currently not medically stable for discharge  Consultants:  PCCM, neurology, interventional radiology  Antimicrobials:  None  Subjective: No acute issues or events overnight, review of systems markedly limited by patient's inability to communicate but  points to stomach and does "thumbs down" consistent with pain, notes thumbs up in regards to head/headache indicating improvement.  Objective: Vitals:   03/15/24 1800 03/15/24 1941 03/15/24 2311 03/16/24 0450  BP: 113/84 122/89 110/85 125/85  Pulse: (!) 51 (!) 49 (!) 52 (!) 57  Resp: 11 10 13 18   Temp:  98.2 F (36.8 C) 98.6 F (37 C) 98 F (36.7 C)  TempSrc:  Oral Oral Oral  SpO2: 98% 99% 97% 97%  Weight:      Height:         Intake/Output Summary (Last 24 hours) at 03/16/2024 0718 Last data filed at 03/16/2024 0400 Gross per 24 hour  Intake 2592.23 ml  Output 3350 ml  Net -757.77 ml   Filed Weights   03/08/24 1400  Weight: 65 kg    Examination:  General:  Pleasantly resting in bed, No acute distress.  Poorly interactive but pointing to stomach and giving a 'thumbs down' HEENT:  Normocephalic atraumatic.  Sclerae nonicteric, noninjected.  Extraocular movements intact bilaterally. Neck:  Without mass or deformity.  Trachea is midline. Lungs:  Clear to auscultate bilaterally without rhonchi, wheeze, or rales. Heart:  Regular rate and rhythm.  Without murmurs, rubs, or gallops. Abdomen:  Soft, nontender, nondistended.  Without guarding or rebound. Extremities: Without cyanosis, clubbing, edema, or obvious deformity.  Data Reviewed: I have personally reviewed following labs and imaging studies  CBC: Recent Labs  Lab 03/10/24 1032 03/11/24 0624 03/12/24 0631 03/13/24 0602 03/14/24 0415 03/15/24 0500 03/16/24 0528  WBC 9.3   < > 7.2 9.5 9.0 12.0* 8.5  NEUTROABS 6.8  --   --   --   --   --   --   HGB 12.3*   < > 13.2 13.6 13.2 13.5 13.4  HCT 37.5*   < > 41.1 42.1 40.8 40.0 39.8  MCV 83.1   < > 81.9 82.5 81.9 80.2 79.9*  PLT 224   < > 239 241 248 255 244   < > = values in this interval not displayed.   Basic Metabolic Panel: Recent Labs  Lab 03/10/24 0452 03/10/24 1300 03/11/24 0600 03/11/24 0621 03/12/24 0631 03/12/24 1325 03/12/24 2345 03/13/24 0602 03/14/24 0415 03/15/24 0500 03/16/24 0528  NA 148*   < >  --    < > 143   < > 139 139 138 139 139  K 3.2*  --   --    < > 3.7  --   --  3.3* 3.4* 3.5 3.1*  CL 122*  --   --    < > 117*  --   --  117* 114* 113* 111  CO2 17*  --   --    < > 18*  --   --  15* 16* 18* 20*  GLUCOSE 118*  --   --    < > 111*  --   --  112* 133* 117* 117*  BUN 8  --   --    < > <5*  --   --  6 9 6 11   CREATININE 0.98  --   --    < > 0.76  --   --  0.95 1.01  0.77 1.08  CALCIUM  8.8*  --   --    < > 9.2  --   --  9.5 9.1 9.2 9.5  MG 1.8  --  1.9  --  2.2  --   --   --   --   --   --  PHOS  --   --   --   --  3.3  --   --   --   --   --   --    < > = values in this interval not displayed.   GFR: Estimated Creatinine Clearance: 80.7 mL/min (by C-G formula based on SCr of 1.08 mg/dL).  Liver Function Tests: Recent Labs  Lab 03/10/24 0452  AST 25  ALT 41  ALKPHOS 52  BILITOT 0.5  PROT 6.1*  ALBUMIN 3.4*   Recent Labs  Lab 03/09/24 0947  AMMONIA 26   Coagulation Profile: Recent Labs  Lab 03/13/24 1753 03/14/24 0415 03/15/24 0500 03/16/24 0528  INR 1.2 1.2 2.0* 2.9*   CBG: Recent Labs  Lab 03/14/24 2129 03/15/24 0744 03/15/24 1101 03/15/24 1535 03/15/24 2139  GLUCAP 102* 122* 129* 116* 115*   Recent Results (from the past 240 hours)  Culture, blood (Routine X 2) w Reflex to ID Panel     Status: None (Preliminary result)   Collection Time: 03/08/24  6:45 PM   Specimen: BLOOD LEFT HAND  Result Value Ref Range Status   Specimen Description BLOOD LEFT HAND  Final   Special Requests   Final    BOTTLES DRAWN AEROBIC ONLY Blood Culture results may not be optimal due to an inadequate volume of blood received in culture bottles   Culture  Setup Time   Final    GRAM POSITIVE RODS AEROBIC BOTTLE ONLY CRITICAL RESULT CALLED TO, READ BACK BY AND VERIFIED WITH: PHARMD ELIZABETH MARTIN ON 03/10/24 @ 1336 BY DRT    Culture   Final    GRAM POSITIVE RODS SENT TO [facility] FOR IDENTIFICATION Performed at [facility], [address], [place], [place] [address]    Report Status PENDING  Incomplete  Culture, blood (Routine X 2) w Reflex to ID Panel     Status: None (Preliminary result)   Collection Time: 03/08/24  6:50 PM   Specimen: BLOOD LEFT HAND  Result Value Ref Range Status   Specimen Description BLOOD LEFT HAND  Final   Special Requests   Final    BOTTLES DRAWN AEROBIC ONLY Blood Culture results may not be optimal due  to an inadequate volume of blood received in culture bottles   Culture  Setup Time   Final    GRAM POSITIVE RODS AEROBIC BOTTLE ONLY CRITICAL VALUE NOTED.  VALUE IS CONSISTENT WITH PREVIOUSLY REPORTED AND CALLED VALUE.    Culture   Final    GRAM POSITIVE RODS SENT TO [facility] FOR ID Performed at [facility], [address] [place], [place] [address]    Report Status PENDING  Incomplete  MRSA Next Gen by PCR, Nasal     Status: None   Collection Time: 03/09/24  2:12 PM   Specimen: Nasal Mucosa; Nasal Swab  Result Value Ref Range Status   MRSA by PCR Next Gen NOT DETECTED NOT DETECTED Final    Comment: (NOTE) The GeneXpert MRSA Assay (FDA approved for NASAL specimens only), is one component of a comprehensive MRSA colonization surveillance program. It is not intended to diagnose MRSA infection nor to guide or monitor treatment for MRSA infections. Test performance is not FDA approved in patients less than 13 years old. Performed at [facility], [address], [place], [place] [address]          Radiology Studies: CT HEAD POST STROKE FOLLOWUP/TIMED/STAT READ Result Date: 03/15/2024 CLINICAL DATA:  Code stroke.  Neuro deficit, acute, stroke suspected. EXAM: CT HEAD WITHOUT CONTRAST TECHNIQUE: Contiguous axial images were obtained from the base of the skull through the vertex without intravenous contrast. RADIATION DOSE REDUCTION: This exam was performed according to the departmental dose-optimization program which includes automated exposure control, adjustment of the mA and/or kV according to patient size and/or use of iterative reconstruction technique. COMPARISON:  Prior head CT examinations 03/09/2024 and earlier. CT venogram head 03/08/2024. Prior brain MRI examinations 03/08/2024 and earlier. FINDINGS: Brain: Known subacute foci of venous parenchymal ischemia within the medial frontal lobes (left greater than right), not appreciably changed in extent from the prior  brain MRI of 03/08/2024. A small focus of subacute venous parenchymal ischemia within the left occipital lobe was better appreciated on the prior MRI of 03/08/2024. No evidence of frank hemorrhagic conversion. Known cerebellar tonsillar ectopia, incompletely imaged and incompletely assessed. No interval acute infarct is identified. No extra-axial fluid collection. No evidence of an intracranial mass. No midline shift. Vascular: No hyperdense arterial vessel. Some areas of persistent hyperdensity in the venous sinuses consistent with known venous thrombotic disease. Skull: No calvarial fracture or aggressive osseous lesion. Sinuses/Orbits: No mass or acute finding within the imaged orbits. Mild mucosal thickening scattered within the paranasal sinuses at the imaged levels. IMPRESSION: 1. No significant change as compared to the non-contrast head CT of 03/09/2024. 2. Known foci of subacute venous parenchymal ischemia within the medial frontal lobes and left occipital lobe, some of which were better appreciated on the prior MRI of 03/08/2024. No evidence of frank hemorrhagic conversion. 3. Some areas of persistent hyperdensity in the venous sinuses consistent with known venous thrombotic disease. 4. Known cerebellar tonsillar ectopia, incompletely imaged and incompletely assessed. 5. Mild paranasal sinus mucosal thickening at the imaged levels. Electronically Signed   By: Bascom Lily D.O.   On: 03/15/2024 16:33    Scheduled Meds:  atorvastatin   80 mg Oral Daily   Chlorhexidine  Gluconate Cloth  6 each Topical Q0600   feeding supplement  237 mL Oral BID BM   methylPREDNISolone  4 mg Oral 4X daily taper   pantoprazole   40 mg Oral Daily   polyethylene glycol  17 g Oral BID   senna  1 tablet Oral BID   sodium chloride   2 g Oral TID WC   Warfarin - Pharmacist Dosing Inpatient   Does not apply q1600   Continuous Infusions:  heparin  750 Units/hr (03/15/24 1800)   valproate sodium 500 mg (03/16/24 0615)      LOS: 8 days   Time spent:  Haydee Lipa, DO [facility]  If 7PM-7AM, please contact night-coverage [email or web address]  03/16/2024, 7:18 AM

## 2024-03-16 NOTE — Plan of Care (Signed)
  Problem: Education: Goal: Knowledge of General Education information will improve Description: Including pain rating scale, medication(s)/side effects and non-pharmacologic comfort measures Outcome: Progressing   Problem: Health Behavior/Discharge Planning: Goal: Ability to manage health-related needs will improve Outcome: Progressing   Problem: Clinical Measurements: Goal: Ability to maintain clinical measurements within normal limits will improve Outcome: Progressing Goal: Will remain free from infection Outcome: Progressing Goal: Diagnostic test results will improve Outcome: Progressing Goal: Respiratory complications will improve Outcome: Progressing Goal: Cardiovascular complication will be avoided Outcome: Progressing   Problem: Activity: Goal: Risk for activity intolerance will decrease Outcome: Progressing   Problem: Nutrition: Goal: Adequate nutrition will be maintained Outcome: Progressing   Problem: Nutrition: Goal: Adequate nutrition will be maintained Outcome: Progressing   Problem: Coping: Goal: Level of anxiety will decrease Outcome: Progressing

## 2024-03-16 NOTE — Progress Notes (Signed)
 Right internal jugular central line removed per MD's order. Process explained to patient as well as his spouse at the bedside in our native [place] language. Held pressure for 20 minutes and site is dressed in vasoline gauze and dry gauze, pressure dressing applied. No bleeding noted. Patient is bedrest and flat in bed.

## 2024-03-17 DIAGNOSIS — R1310 Dysphagia, unspecified: Secondary | ICD-10-CM | POA: Diagnosis not present

## 2024-03-17 DIAGNOSIS — G08 Intracranial and intraspinal phlebitis and thrombophlebitis: Secondary | ICD-10-CM | POA: Diagnosis not present

## 2024-03-17 DIAGNOSIS — E785 Hyperlipidemia, unspecified: Secondary | ICD-10-CM | POA: Diagnosis not present

## 2024-03-17 DIAGNOSIS — G936 Cerebral edema: Secondary | ICD-10-CM | POA: Diagnosis not present

## 2024-03-17 LAB — CBC
HCT: 39.1 % (ref 39.0–52.0)
Hemoglobin: 13.2 g/dL (ref 13.0–17.0)
MCH: 26.9 pg (ref 26.0–34.0)
MCHC: 33.8 g/dL (ref 30.0–36.0)
MCV: 79.8 fL — ABNORMAL LOW (ref 80.0–100.0)
Platelets: 232 10*3/uL (ref 150–400)
RBC: 4.9 MIL/uL (ref 4.22–5.81)
RDW: 12.9 % (ref 11.5–15.5)
WBC: 14 10*3/uL — ABNORMAL HIGH (ref 4.0–10.5)
nRBC: 0 % (ref 0.0–0.2)

## 2024-03-17 LAB — PROTIME-INR
INR: 2.6 — ABNORMAL HIGH (ref 0.8–1.2)
Prothrombin Time: 27.7 s — ABNORMAL HIGH (ref 11.4–15.2)

## 2024-03-17 LAB — GLUCOSE, CAPILLARY
Glucose-Capillary: 116 mg/dL — ABNORMAL HIGH (ref 70–99)
Glucose-Capillary: 185 mg/dL — ABNORMAL HIGH (ref 70–99)
Glucose-Capillary: 164 mg/dL — ABNORMAL HIGH (ref 70–99)
Glucose-Capillary: 139 mg/dL — ABNORMAL HIGH (ref 70–99)

## 2024-03-17 LAB — APTT: aPTT: 40 s — ABNORMAL HIGH (ref 24–36)

## 2024-03-17 MED ORDER — ENSURE ENLIVE PO LIQD
237.0000 mL | Freq: Three times a day (TID) | ORAL | Status: DC
  Administered 2024-03-18: 237 mL via ORAL

## 2024-03-17 MED ORDER — ADULT MULTIVITAMIN W/MINERALS CH
1.0000 | ORAL_TABLET | Freq: Every day | ORAL | Status: DC
  Administered 2024-03-17 – 2024-03-18 (×2): 1 via ORAL
  Filled 2024-03-17 (×2): qty 1

## 2024-03-17 MED ORDER — WARFARIN SODIUM 2.5 MG PO TABS
2.5000 mg | ORAL_TABLET | Freq: Once | ORAL | Status: AC
  Administered 2024-03-17: 2.5 mg via ORAL
  Filled 2024-03-17: qty 1

## 2024-03-17 MED ORDER — CHLORPROMAZINE HCL 10 MG PO TABS
10.0000 mg | ORAL_TABLET | Freq: Three times a day (TID) | ORAL | Status: DC | PRN
  Administered 2024-03-17 – 2024-03-18 (×4): 10 mg via ORAL
  Filled 2024-03-17 (×5): qty 1

## 2024-03-17 MED ORDER — DIVALPROEX SODIUM 500 MG PO DR TAB
500.0000 mg | DELAYED_RELEASE_TABLET | Freq: Two times a day (BID) | ORAL | Status: DC
  Administered 2024-03-17 – 2024-03-18 (×2): 500 mg via ORAL
  Filled 2024-03-17 (×3): qty 1

## 2024-03-17 NOTE — H&P (Signed)
 Physical Medicine and Rehabilitation Admission H&P    Samuel Complaint  Patient presents with   Code Stroke  : HPI: Samuel Warren is a 33 year old right-handed male [place] speaking with history significant for deafness and uses some sign language, hyperlipidemia and GERD as well as CVST status post IR for thrombectomy 02/01/2024 as well as recent viral meningitis/history of CVA completing course of Doxy.  Per chart review patient lives with & family.  1 level home 7 steps to entry.  Independent prior to admission working at [employer] and he has been receiving outpatient therapies.  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  He was admitted to tab by neurology services for workup and discharged to home on Eliquis .  Presented 03/08/2024 after a witnessed fall and remained on the floor until EMS was dispatched with noted right side weakness left gaze preference.  Reportedly patient had 2 apneic spells with EMS and required transient BVM.  CT/MRI showed persistent findings of extensive dural venous sinus thrombosis, better characterized on CT venogram performed the same day.  Evolving subacute venous ischemic changes involving the paramedian frontal lobes and likely the left occipital lobe, not significantly changed as compared to previous.  Mild associated petechial hemorrhage at the posterior left frontal region, stable from prior.  No frank hemorrhagic transformation or significant mass effect.  EEG negative for seizure.  Most recent echocardiogram with ejection fraction of 45 to 50% the left ventricle demonstrating regional wall motion abnormality.  Admission chemistries unremarkable except WBC 12,600, glucose 120, total CK of 262.  Underwent bilateral common carotid arteriograms and internal  jugular venograms left CFA approach and right common femoral vein approach that showed absent flow noted no posterior one third the superior sagittal sinus, the transverse sinuses of the sigmoid sinuses bilaterally including the internal jugular veins proximally.  Multiple attempts at revascularization unsuccessful.  No need for LP at this time given no sign of CNS infection especially with cerebral edema and low-lying cerebellar tonsils extending up to 9 mm below the foramen magnum.  Neurology follow-up initially placed on IV heparin  as well as 3% saline with bolus and has been transitioned back to Eliquis  and changed to Coumadin 03/17/2024 for persistent dural venous sinus thrombus.  Hospital course persistent bouts of headaches initially on Topamax  stopped due to low bicarb and transition to Medrol Dosepak and Depacon.  He is on a regular diet.  Therapy evaluations completed due to patient's decreased functional mobility was admitted for a comprehensive rehab program.  Review of Systems  Constitutional:  Negative for chills and fever.  HENT:         Deafness  Eyes:  Negative for blurred vision and double vision.  Respiratory:  Negative for cough, shortness of breath and wheezing.   Cardiovascular:  Negative for chest pain, palpitations and leg swelling.  Gastrointestinal:  Positive for nausea.  Genitourinary:  Negative for dysuria, flank pain and hematuria.  Musculoskeletal:  Positive for myalgias.  Neurological:  Positive for headaches.  All other systems reviewed and are negative.  Past Medical History:  Diagnosis Date   Deaf    Past Surgical History:  Procedure Laterality Date   IR ANGIO INTRA EXTRACRAN SEL COM CAROTID INNOMINATE BILAT MOD SED  02/02/2024   IR ANGIO INTRA EXTRACRAN SEL COM CAROTID INNOMINATE BILAT MOD SED  03/09/2024  IR CT HEAD LTD  03/09/2024   IR THROMBECT VENO MECH MOD SED  02/02/2024   IR THROMBECT VENO MECH MOD SED  03/09/2024   IR VENO SAGITTAL SINUS  03/09/2024   IR  VENO/JUGULAR LEFT  02/02/2024   RADIOLOGY WITH ANESTHESIA N/A 02/02/2024   Procedure: RADIOLOGY WITH ANESTHESIA;  Surgeon: Luellen Sages, MD;  Location: [facility] OR;  Service: Radiology;  Laterality: N/A;   RADIOLOGY WITH ANESTHESIA N/A 03/09/2024   Procedure: RADIOLOGY WITH ANESTHESIA;  Surgeon: Luellen Sages, MD;  Location: [facility] OR;  Service: Radiology;  Laterality: N/A;   History reviewed. No pertinent family history. Social History:  reports that he has never smoked. He has never used smokeless tobacco. He reports that he does not currently use alcohol. He reports that he does not currently use drugs. Allergies: No Known Allergies Medications Prior to Admission  Medication Sig Dispense Refill   apixaban  (ELIQUIS ) 5 MG TABS tablet Take 1 tablet (5 mg total) by mouth 2 (two) times daily. 60 tablet 0   atorvastatin  (LIPITOR ) 80 MG tablet Take 1 tablet (80 mg total) by mouth daily. 30 tablet 0   butalbital -acetaminophen -caffeine  (FIORICET ) 50-325-40 MG tablet Take 1 tablet by mouth every 6 (six) hours as needed (refractory HA.). 14 tablet 0   ondansetron  (ZOFRAN ) 4 MG tablet Take 1 tablet (4 mg total) by mouth every 6 (six) hours as needed for nausea. 20 tablet 0   pantoprazole  (PROTONIX ) 40 MG tablet Take 1 tablet (40 mg total) by mouth daily. (Patient taking differently: Take 40 mg by mouth daily before breakfast.) 30 tablet 0   topiramate  (TOPAMAX ) 25 MG tablet Take 1 tablet (25 mg total) by mouth daily. 30 tablet 0      Home: Home Living Family/patient expects to be discharged to:: Private residence Living Arrangements: Parent, Other relatives Available Help at Discharge: Family, Available 24 hours/day Type of Home: House Home Access: Stairs to enter [employer] of Steps: 7 Entrance Stairs-Rails: Left, Right Home Layout: One level Bathroom Shower/Tub: Engineer, manufacturing systems: Standard Home Equipment: Agricultural consultant (2 wheels)   Functional History: Prior  Function Prior Level of Function : Needs assist Mobility Comments: RW ADLs Comments: working with OP OT PTA, supervision for ADLs per prior therapy note, working at [employer] prior to previous 2 hospitalizations  Functional Status:  Mobility: Bed Mobility Overal bed mobility: Needs Assistance Bed Mobility: Supine to Sit, Sit to Supine Sidelying to sit: Mod assist, +2 for physical assistance, HOB elevated Supine to sit: Mod assist, HOB elevated Sit to supine: Max assist General bed mobility comments: Increased assist returning to supine due to pt fatigue Transfers Overall transfer level: Needs assistance Equipment used: 1 person hand held assist Transfers: Sit to/from Stand Sit to Stand: Mod assist Bed to/from chair/wheelchair/BSC transfer type:: Step pivot Step pivot transfers: Mod assist, +2 physical assistance General transfer comment: pt stands 3 times, posterior lean present for all 3 attempts and rightward lean present for 2. PT provides cues via ASL interpreter for foot placement and increased anterior weight shift. One standing attempt with RW with slight improvement in posterior lean Ambulation/Gait Ambulation/Gait assistance: Mod assist, Max assist, +2 physical assistance Gait Distance (Feet): 4 Feet Assistive device: Rolling walker (2 wheels) Gait Pattern/deviations: Step-to pattern, Decreased stride length, Decreased dorsiflexion - right, Decreased step length - right General Gait Details: pt walking ~4' with RW and mod-maxA+2, chair follow. pt with R lateral lean though improved with forward weight translation, initially taking complete steps bilaterally  though requiring increased cues to step with RLE; seated rest due to fatigue Gait velocity: Decreased Pre-gait activities: maxA to side step to left side for 2'    ADL: ADL Overall ADL's : Needs assistance/impaired Eating/Feeding: Minimal assistance, Sitting Grooming: Wash/dry face, Set up, Sitting Upper  Body Bathing: Moderate assistance, Sitting, Bed level Lower Body Bathing: Maximal assistance, Sitting/lateral leans, Bed level Upper Body Dressing : Moderate assistance, Sitting, Bed level Lower Body Dressing: Maximal assistance, Sitting/lateral leans, Sit to/from stand Toilet Transfer: Maximal assistance, +2 for physical assistance, Moderate assistance Toileting- Clothing Manipulation and Hygiene: Maximal assistance Functional mobility during ADLs: Moderate assistance, Maximal assistance, +2 for physical assistance  Cognition: Cognition Orientation Level: Other (comment) (nonverbal/mute; deaf) Cognition Arousal: Alert Behavior During Therapy: WFL for tasks assessed/performed  Physical Exam: Blood pressure 109/77, pulse 65, temperature (!) 97.5 F (36.4 C), temperature source Axillary, resp. rate 12, height 5\' 4"  (1.626 m), weight 65 kg, SpO2 97%. Physical Exam Neurological:     Comments: [name] makes eye contact with examiner.  Exam limited by language barrier.  Follows simple demonstrated commands.  His brother who was in the room does help to translate.     Results for orders placed or performed during the hospital encounter of 03/08/24 (from the past 48 hours)  Glucose, capillary     Status: Abnormal   Collection Time: 03/15/24  3:35 PM  Result Value Ref Range   Glucose-Capillary 116 (H) 70 - 99 mg/dL    Comment: Glucose reference range applies only to samples taken after fasting for at least 8 hours.  APTT     Status: Abnormal   Collection Time: 03/15/24  5:10 PM  Result Value Ref Range   aPTT 98 (H) 24 - 36 seconds    Comment:        IF BASELINE aPTT IS ELEVATED, SUGGEST PATIENT RISK ASSESSMENT BE USED TO DETERMINE APPROPRIATE ANTICOAGULANT THERAPY. Performed at [facility], [address] [place], [place] [address]   Glucose, capillary     Status: Abnormal   Collection Time: 03/15/24  9:39 PM  Result Value Ref Range   Glucose-Capillary 115 (H) 70 -  99 mg/dL    Comment: Glucose reference range applies only to samples taken after fasting for at least 8 hours.  Basic metabolic panel     Status: Abnormal   Collection Time: 03/16/24  5:28 AM  Result Value Ref Range   Sodium 139 135 - 145 mmol/L   Potassium 3.1 (L) 3.5 - 5.1 mmol/L   Chloride 111 98 - 111 mmol/L   CO2 20 (L) 22 - 32 mmol/L   Glucose, Bld 117 (H) 70 - 99 mg/dL    Comment: Glucose reference range applies only to samples taken after fasting for at least 8 hours.   BUN 11 6 - 20 mg/dL   Creatinine, Ser 6.04 0.61 - 1.24 mg/dL   Calcium  9.5 8.9 - 10.3 mg/dL   GFR, Estimated >54 >09 mL/min    Comment: (NOTE) Calculated using the CKD-EPI Creatinine Equation (2021)    Anion gap 8 5 - 15    Comment: Performed at [facility], [address] [place], [place] [address]  Protime-INR     Status: Abnormal   Collection Time: 03/16/24  5:28 AM  Result Value Ref Range   Prothrombin Time 30.8 (H) 11.4 - 15.2 seconds   INR 2.9 (H) 0.8 - 1.2    Comment: (NOTE) INR goal varies based on device  and disease states. Performed at [facility], [address] [place], [place] [address]   APTT     Status: Abnormal   Collection Time: 03/16/24  5:28 AM  Result Value Ref Range   aPTT 95 (H) 24 - 36 seconds    Comment:        IF BASELINE aPTT IS ELEVATED, SUGGEST PATIENT RISK ASSESSMENT BE USED TO DETERMINE APPROPRIATE ANTICOAGULANT THERAPY. Performed at [facility], [address], [place], [place] [address]   Heparin  level (unfractionated)     Status: None   Collection Time: 03/16/24  5:28 AM  Result Value Ref Range   Heparin  Unfractionated 0.64 0.30 - 0.70 IU/mL    Comment: (NOTE) The clinical reportable range upper limit is being lowered to >1.10 to align with the FDA approved guidance for the current laboratory assay.  If heparin  results are below expected values, and patient dosage has  been confirmed, suggest follow up testing of antithrombin III  levels. Performed at [facility], [address], [place], [place] [address]   CBC     Status: Abnormal   Collection Time: 03/16/24  5:28 AM  Result Value Ref Range   WBC 8.5 4.0 - 10.5 K/uL   RBC 4.98 4.22 - 5.81 MIL/uL   Hemoglobin 13.4 13.0 - 17.0 g/dL   HCT 13.2 44.0 - 10.2 %   MCV 79.9 (L) 80.0 - 100.0 fL   MCH 26.9 26.0 - 34.0 pg   MCHC 33.7 30.0 - 36.0 g/dL   RDW 72.5 36.6 - 44.0 %   Platelets 244 150 - 400 K/uL   nRBC 0.0 0.0 - 0.2 %    Comment: Performed at [facility], [address], [place], [place] [address]  Glucose, capillary     Status: Abnormal   Collection Time: 03/16/24  7:58 AM  Result Value Ref Range   Glucose-Capillary 122 (H) 70 - 99 mg/dL    Comment: Glucose reference range applies only to samples taken after fasting for at least 8 hours.  Glucose, capillary     Status: Abnormal   Collection Time: 03/16/24 11:46 AM  Result Value Ref Range   Glucose-Capillary 141 (H) 70 - 99 mg/dL    Comment: Glucose reference range applies only to samples taken after fasting for at least 8 hours.  Glucose, capillary     Status: Abnormal   Collection Time: 03/16/24  4:32 PM  Result Value Ref Range   Glucose-Capillary 118 (H) 70 - 99 mg/dL    Comment: Glucose reference range applies only to samples taken after fasting for at least 8 hours.  Glucose, capillary     Status: Abnormal   Collection Time: 03/16/24  9:06 PM  Result Value Ref Range   Glucose-Capillary 142 (H) 70 - 99 mg/dL    Comment: Glucose reference range applies only to samples taken after fasting for at least 8 hours.  Protime-INR     Status: Abnormal   Collection Time: 03/17/24  4:59 AM  Result Value Ref Range   Prothrombin Time 27.7 (H) 11.4 - 15.2 seconds   INR 2.6 (H) 0.8 - 1.2    Comment: (NOTE) INR goal varies based on device and disease states. Performed at [facility], [address] [place], [place] [address]   APTT     Status: Abnormal   Collection Time: 03/17/24  4:59  AM  Result Value Ref Range   aPTT 40 (H) 24 -  36 seconds    Comment:        IF BASELINE aPTT IS ELEVATED, SUGGEST PATIENT RISK ASSESSMENT BE USED TO DETERMINE APPROPRIATE ANTICOAGULANT THERAPY. Performed at [facility], [address], [place], [place] [address]   CBC     Status: Abnormal   Collection Time: 03/17/24  4:59 AM  Result Value Ref Range   WBC 14.0 (H) 4.0 - 10.5 K/uL   RBC 4.90 4.22 - 5.81 MIL/uL   Hemoglobin 13.2 13.0 - 17.0 g/dL   HCT 86.5 78.4 - 69.6 %   MCV 79.8 (L) 80.0 - 100.0 fL   MCH 26.9 26.0 - 34.0 pg   MCHC 33.8 30.0 - 36.0 g/dL   RDW 29.5 28.4 - 13.2 %   Platelets 232 150 - 400 K/uL   nRBC 0.0 0.0 - 0.2 %    Comment: Performed at [facility], [address], [place], [place] [address]  Glucose, capillary     Status: Abnormal   Collection Time: 03/17/24  7:34 AM  Result Value Ref Range   Glucose-Capillary 116 (H) 70 - 99 mg/dL    Comment: Glucose reference range applies only to samples taken after fasting for at least 8 hours.  Glucose, capillary     Status: Abnormal   Collection Time: 03/17/24 11:55 AM  Result Value Ref Range   Glucose-Capillary 185 (H) 70 - 99 mg/dL    Comment: Glucose reference range applies only to samples taken after fasting for at least 8 hours.   CT HEAD POST STROKE FOLLOWUP/TIMED/STAT READ Result Date: 03/15/2024 CLINICAL DATA:  Code stroke. Neuro deficit, acute, stroke suspected. EXAM: CT HEAD WITHOUT CONTRAST TECHNIQUE: Contiguous axial images were obtained from the base of the skull through the vertex without intravenous contrast. RADIATION DOSE REDUCTION: This exam was performed according to the departmental dose-optimization program which includes automated exposure control, adjustment of the mA and/or kV according to patient size and/or use of iterative reconstruction technique. COMPARISON:  Prior head CT examinations 03/09/2024 and earlier. CT venogram head 03/08/2024. Prior brain MRI examinations 03/08/2024 and  earlier. FINDINGS: Brain: Known subacute foci of venous parenchymal ischemia within the medial frontal lobes (left greater than right), not appreciably changed in extent from the prior brain MRI of 03/08/2024. A small focus of subacute venous parenchymal ischemia within the left occipital lobe was better appreciated on the prior MRI of 03/08/2024. No evidence of frank hemorrhagic conversion. Known cerebellar tonsillar ectopia, incompletely imaged and incompletely assessed. No interval acute infarct is identified. No extra-axial fluid collection. No evidence of an intracranial mass. No midline shift. Vascular: No hyperdense arterial vessel. Some areas of persistent hyperdensity in the venous sinuses consistent with known venous thrombotic disease. Skull: No calvarial fracture or aggressive osseous lesion. Sinuses/Orbits: No mass or acute finding within the imaged orbits. Mild mucosal thickening scattered within the paranasal sinuses at the imaged levels. IMPRESSION: 1. No significant change as compared to the non-contrast head CT of 03/09/2024. 2. Known foci of subacute venous parenchymal ischemia within the medial frontal lobes and left occipital lobe, some of which were better appreciated on the prior MRI of 03/08/2024. No evidence of frank hemorrhagic conversion. 3. Some areas of persistent hyperdensity in the venous sinuses consistent with known venous thrombotic disease. 4. Known cerebellar tonsillar ectopia, incompletely imaged and incompletely assessed. 5. Mild paranasal sinus mucosal thickening at the imaged levels. Electronically Signed   By: Bascom Lily D.O.   On: 03/15/2024 16:33  Blood pressure 109/77, pulse 65, temperature (!) 97.5 F (36.4 C), temperature source Axillary, resp. rate 12, height 5\' 4"  (1.626 m), weight 65 kg, SpO2 97%.  Medical Problem List and Plan: 1. Functional deficits secondary to persistent dural venous sinus thrombosis  -patient may *** shower  -ELOS/Goals: *** 2.   Antithrombotics: -DVT/anticoagulation:  Pharmaceutical: Coumadin  -antiplatelet therapy: N/A 3. Pain Management: Fioricet  as needed for refractory headaches, Tylenol  as needed 4. Mood/Behavior/Sleep: Provide emotional support  -antipsychotic agents: N/A 5. Neuropsych/cognition: This patient is capable of making decisions on his own behalf. 6. Skin/Wound Care: Routine skin checks 7. Fluids/Electrolytes/Nutrition: Routine in and outs with follow-up chemistries 8.  Cerebral edema.  Hypertonic saline discontinued 9.  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  Status post IR for thrombectomy was discharged on Eliquis  10.  History of stroke/recent viral meningitis.  Completed course of Doxy 11.  Refractory headaches.  Medrol Dosepak.  Topamax  discontinued due to low bicarb 12.  Hyperlipidemia.  Lipitor  13.  Deafness.  Patient does use some sign language 14.  GERD.  Protonix    Sterling Eisenmenger, PA-C 03/17/2024

## 2024-03-17 NOTE — Plan of Care (Signed)

## 2024-03-17 NOTE — Progress Notes (Signed)
 [facility] ROUNDING NOTE Samuel Warren JWJ:[id number]  DOB: [birth year]  DOA: 03/08/2024  PCP: Alto Atta, NP  03/17/2024,2:48 PM  LOS: 9 days    Code Status: Full   From:  home   Current Dispo: CIR?   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 treated with doxycycline  at discharge-CSF cultures overall negative blood cultures negative felt to possibly be secondary to tick bite after while [place] hunting trip Rehospitalized 3/23 through 4/1 with severe headache CT in ED showed multiple venous thrombi without hypodensity-left common carotid artery showed extensive dural venous sinus thrombus in posterior aspect of superior sagittal sinus-echo 45-50%-on 3/24 revascularization recommended and performed-was told to continue Eliquis  for 6 to 9 months Readmitted 4/22 with right-sided weakness-venous infarct spanning 2.6 cm was admitted for stroke workup and discharged Readmit 4/28 being found down on the ground CT head unchanged small subacute left frontal lobe infarct in setting of known venous sinus thrombosis CTA CTV obtained heparin  drip was started Critical care consulted because of apneic 4/29 IR thrombectomy vena mesh unsuccessful 4/30 continuous EEG unremarkable 5/2 hypertonic saline discontinued 5/4 transferred to hospitalist service   Plan  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% Seems euvolemic and compensated-only use Lipitor  for now  Hiccups Started this hospitalization will prescribe chlorpromazine tablets 10 3 times daily as needed pick up  Congenitally deaf Communicates with interpreter  DVT prophylaxis: Therapeutic on Coumadin  Status is: Inpatient Remains inpatient appropriate because:   Await plan for rehab   Subjective: Plan discussed with patient in sign language as well as with relatives in dialect [place] States headache is better he is having some hiccups no chest pain no fever  Objective + exam Vitals:   03/16/24 2258 03/17/24 0320 03/17/24 0737 03/17/24 1105  BP: 130/89 121/89 109/77   Pulse: 62 63 65   Resp: 12 14 12 20   Temp: 98.5 F (36.9 C) 98.4 F (36.9 C) (!) 97.5 F (36.4 C)   TempSrc: Oral Oral Axillary   SpO2: 96% 98% 97%   Weight:      Height:       Filed Weights   03/08/24 1400  Weight: 65 kg    Examination: EOMI NCAT no focal deficit does not follow with eyes Power 5/5 able to raise arms Finger-nose-finger not tested S1-S2 no murmur Soft no rebound no guarding Flat affect  Data Reviewed: reviewed   CBC    Component Value Date/Time   WBC 14.0 (H) 03/17/2024 0459   RBC 4.90 03/17/2024 0459   HGB 13.2 03/17/2024 0459   HCT 39.1 03/17/2024 0459   PLT 232 03/17/2024 0459   MCV 79.8 (L) 03/17/2024 0459   MCH 26.9 03/17/2024 0459   MCHC 33.8 03/17/2024 0459   RDW 12.9 03/17/2024 0459   LYMPHSABS 1.5 03/10/2024 1032   MONOABS 1.0 03/10/2024 1032   EOSABS 0.0 03/10/2024 1032   BASOSABS  0.0 03/10/2024 1032      Latest Ref Rng & Units 03/16/2024    5:28 AM 03/15/2024    5:00 AM 03/14/2024    4:15 AM  CMP  Glucose 70 - 99 mg/dL 161  096  045   BUN 6 - 20 mg/dL 11  6  9    Creatinine 0.61 - 1.24 mg/dL 4.09  8.11  9.14   Sodium 135 - 145 mmol/L 139  139  138   Potassium 3.5 - 5.1 mmol/L 3.1  3.5  3.4   Chloride 98 - 111 mmol/L  111  113  114   CO2 22 - 32 mmol/L 20  18  16    Calcium  8.9 - 10.3 mg/dL 9.5  9.2  9.1     Scheduled Meds:  atorvastatin   80 mg Oral Daily   Chlorhexidine  Gluconate Cloth  6 each Topical Q0600   feeding supplement  237 mL Oral TID BM   methylPREDNISolone  4 mg Oral 4X daily taper   multivitamin with minerals  1 tablet Oral Daily   pantoprazole   40 mg Oral Daily   polyethylene glycol  17 g Oral BID   senna  1 tablet Oral BID   sodium chloride   2 g Oral TID WC   warfarin  2.5 mg Oral ONCE-1600   Warfarin - Pharmacist Dosing Inpatient   Does not apply q1600   Continuous Infusions:  valproate sodium 500 mg (03/17/24 1252)    Time  33  Verlie Glisson, MD  [facility]

## 2024-03-17 NOTE — Progress Notes (Signed)
 Patient sat in chair since this morning; assisted back to bed at 1300pm.

## 2024-03-17 NOTE — Progress Notes (Signed)
 Physical Therapy Treatment Patient Details Name: Samuel Warren MRN: [id number] DOB: 01/06/1991 Today's Date: 03/17/2024   History of Present Illness 33 y.o. male admitted 03/09/24 with R-side weakness. Head CT with unchanged small subacute L frontal infarct. CTA with worsening dural venous sinus thrombus. S/p bilateral common carotid angiograms, internal jugular venograms and unsuccessful thrombectomy on 4/29. EEG 4/30 without seizures. 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    PT Comments  In-person interpreters utilized for this session, x1 to provide ASL interpretation to pt and x1 to provide [employer] interpretation to the family. Focused session on increasing pt's independence with functional mobility and on improving midline alignment in order to eventually progress OOB mobility with improved ease and safety. The pt required only minA to transition supine to sit EOB today and progressed from needing modA to only needing minA with subsequent sit <> stand reps using the stedy today. The pt did appear to benefit from being provided visual targets to reach with different body parts to correct his R lateral and posterior lean, like bringing his abdomen to the center of the stedy bar, resulting in him progressing from needing modA to being able to stand statically with brief moments of only CGA for safety. When trying to utilize the mirror in the bathroom for further visual input, he demonstrated some difficulty understanding how to utilize it to cue himself to lean one direction or the other, often preferring to look back down at his abdomen and the stedy bar. Will continue to follow acutely.     If plan is discharge home, recommend the following: A lot of help with bathing/dressing/bathroom;Assistance with cooking/housework;Assist for transportation;Help with stairs or ramp for entrance;Two people to help with walking  and/or transfers   Can travel by private Theme park manager (measurements PT);Wheelchair cushion (measurements PT);Other (comment);BSC/3in1;Hospital bed;Hoyer lift;Rolling walker (2 wheels) (would benefit from a custom wheelchair currently but hopeful sitting balance continues to improve)    Recommendations for Other Services       Precautions / Restrictions Precautions Precautions: Fall;Other (comment) Recall of Precautions/Restrictions: Intact Precaution/Restrictions Comments: R cervical rotation and some R inattention Restrictions Weight Bearing Restrictions Per Provider Order: No     Mobility  Bed Mobility Overal bed mobility: Needs Assistance Bed Mobility: Supine to Sit     Supine to sit: HOB elevated, Min assist, Used rails     General bed mobility comments: Verbal, visual, and tactile cues provided to bring R leg off L EOB and to bring bil UEs to L bedrail to pull to sit up. Pt able to initiate pulling up to sit but would often get weak > half way up and lay back down on his L side, needing minA to complete the transition to ascend his trunk to sit up.    Transfers Overall transfer level: Needs assistance Equipment used: Ambulation equipment used Transfers: Sit to/from Stand, Bed to chair/wheelchair/BSC Sit to Stand: Mod assist, Min assist           General transfer comment: Pt demonstrated less posterior lean but continued R lateral lean with hands on stedy bars. He required modA to transfer to stand using the stedy the first x2 reps but progressed to minA the final x3 reps as the pt began to use visual targets to get his abdomen to the center of the stedy bar to reduce his posterior and R lateral lean. Stedy utilized to transfer pt  OOB to the recliner. Transfer via Lift Equipment: Stedy  Ambulation/Gait               General Gait Details: deferred today to focus on improving his midline balance   Stairs              Wheelchair Mobility     Tilt Bed    Modified Rankin (Stroke Patients Only) Modified Rankin (Stroke Patients Only) Pre-Morbid Rankin Score: No significant disability Modified Rankin: Severe disability     Balance Overall balance assessment: Needs assistance Sitting-balance support: Single extremity supported, Feet supported, Bilateral upper extremity supported Sitting balance-Leahy Scale: Poor Sitting balance - Comments: Pt leans posteriorly and laterally to the R, intermittently propping back on bil hands on bed to sit or leaning his elbows to his thighs to sit with CGA for safety, but often needing min-modA to maintain his midline alignment. Postural control: Posterior lean, Right lateral lean Standing balance support: Bilateral upper extremity supported, Reliant on assistive device for balance Standing balance-Leahy Scale: Poor Standing balance comment: Pt leaning posteriorly and to the R, needing modA initially for standing balance. Progressed to minA with intermittent short bouts of CGA when pt looked down at the stedy bar and focused on bringing his abdomen to the center of the stedy bar as a target. Attempted to utilize the mirror in the bathroom to provide further visual input but pt often preferred looking down at the bar instead.                            Communication Communication Communication: Impaired Factors Affecting Communication: Hearing impaired;Other (comment) (ASL interpreter present; [place] [employer] interpreter utilized for family during session also)  Cognition Arousal: Alert Behavior During Therapy: WFL for tasks assessed/performed   PT - Cognitive impairments: Difficult to assess Difficult to assess due to: Hard of hearing/deaf, Non-English speaking                     PT - Cognition Comments: pt follows commands with increased time and use of ASL interpreter. Difficult to assess pt's full understanding of situation as the pt does  not utilize formal ASL. He needed repeated multi-modal cues to recognize his directional lean and correct it, heavily relying on his vision and targets to reach to correct it. Following commands: Impaired Following commands impaired: Follows one step commands with increased time    Cueing Cueing Techniques: Gestural cues, Tactile cues, Visual cues  Exercises      General Comments        Pertinent Vitals/Pain Pain Assessment Pain Assessment: Faces Faces Pain Scale: Hurts little more Pain Location: head Pain Descriptors / Indicators: Headache Pain Intervention(s): Limited activity within patient's tolerance, Monitored during session, Repositioned    Home Living                          Prior Function            PT Goals (current goals can now be found in the care plan section) Acute Rehab PT Goals Patient Stated Goal: to walk per family, pt did not state PT Goal Formulation: With patient/family Time For Goal Achievement: 03/24/24 Potential to Achieve Goals: Fair Progress towards PT goals: Progressing toward goals    Frequency    Min 3X/week      PT Plan      Co-evaluation  AM-PAC PT "6 Clicks" Mobility   Outcome Measure  Help needed turning from your back to your side while in a flat bed without using bedrails?: A Little Help needed moving from lying on your back to sitting on the side of a flat bed without using bedrails?: A Little Help needed moving to and from a bed to a chair (including a wheelchair)?: A Lot Help needed standing up from a chair using your arms (e.g., wheelchair or bedside chair)?: A Lot Help needed to walk in hospital room?: Total Help needed climbing 3-5 steps with a railing? : Total 6 Click Score: 12    End of Session Equipment Utilized During Treatment: Gait belt Activity Tolerance: Patient tolerated treatment well Patient left: with call bell/phone within reach;in chair;with chair alarm set Nurse  Communication: Mobility status;Need for lift equipment PT Visit Diagnosis: Muscle weakness (generalized) (M62.81);Unsteadiness on feet (R26.81);Other abnormalities of gait and mobility (R26.89);Difficulty in walking, not elsewhere classified (R26.2);Other symptoms and signs involving the nervous system (R29.898)     Time: 4098-1191 PT Time Calculation (min) (ACUTE ONLY): 33 min  Charges:    $Therapeutic Activity: 8-22 mins $Neuromuscular Re-education: 8-22 mins PT General Charges $$ ACUTE PT VISIT: 1 Visit                     Vernida Goodie, PT, DPT Acute Rehabilitation Services  Office: [phone]    Ellyn Hack 03/17/2024, 3:31 PM

## 2024-03-17 NOTE — Progress Notes (Signed)
 Inpatient Rehab Admissions Coordinator:   I did received approval from [facility] for CIR admission, but I do not have a bed for this patient to admit today.  I will follow for potential admit in the next 1-2 days pending bed availability.    Loye Rumble, PT, DPT Admissions Coordinator [phone] 03/17/24  10:57 AM

## 2024-03-17 NOTE — Progress Notes (Signed)
 PHARMACY - ANTICOAGULATION CONSULT NOTE  Pharmacy Consult:  Warfarin (Coumadin) Indication: Persistent dural venous sinus thrombus  No Known Allergies  Patient Measurements: Height: 5\' 4"  (162.6 cm) Weight: 65 kg (143 lb 4.8 oz) IBW/kg (Calculated) : 59.2 HEPARIN  DW (KG): 65  Vital Signs: Temp: 97.5 F (36.4 C) (05/07 0737) Temp Source: Axillary (05/07 0737) BP: 109/77 (05/07 0737) Pulse Rate: 65 (05/07 0737)  Labs: Recent Labs    03/15/24 0500 03/15/24 0730 03/15/24 1710 03/16/24 0528 03/17/24 0459  HGB 13.5  --   --  13.4 13.2  HCT 40.0  --   --  39.8 39.1  PLT 255  --   --  244 232  APTT 156*   < > 98* 95* 40*  LABPROT 22.6*  --   --  30.8* 27.7*  INR 2.0*  --   --  2.9* 2.6*  HEPARINUNFRC >1.10*  --   --  0.64  --   CREATININE 0.77  --   --  1.08  --    < > = values in this interval not displayed.    Estimated Creatinine Clearance: 80.7 mL/min (by C-G formula based on SCr of 1.08 mg/dL).   Assessment: 12 YOM with history of sinus thrombosis on Eliquis  PTA. He returned with new onset of R-sided weakness and cerebral edema.  Patient was initially on IV heparin , then transitioned back to PTA Eliquis .  Pharmacy consulted to transition patient to IV heparin  bridge to Coumadin starting 5/3.   Last Eliquis  dose was on 5/2 at 2118, so will use aPTT to guide heparin  dosing. INR therapeutic x24 hours so heparin  gtt was discontinued. Pharmacy will continue to follow for warfarin dosing.  INR 2.6 - therapeutic  CBC WNL    Goal of Therapy:  INR 2-3 Heparin  level 0.3-0.5 units/ml aPTT 66-85 seconds Monitor platelets by anticoagulation protocol: Yes   Plan:   Warfarin 2.5mg  PO x1 today  Daily INR, CBC  Oralee Billow, PharmD, BCCCP Clinical Pharmacist 03/17/2024 8:19 AM

## 2024-03-17 NOTE — Plan of Care (Signed)
 Problem: Education: Goal: Knowledge of General Education information will improve Description: Including pain rating scale, medication(s)/side effects and non-pharmacologic comfort measures 03/17/2024 0453 by Alejos Husband, RN Outcome: Progressing 03/16/2024 2100 by Alejos Husband, RN Outcome: Progressing   Problem: Health Behavior/Discharge Planning: Goal: Ability to manage health-related needs will improve 03/17/2024 0453 by Alejos Husband, RN Outcome: Progressing 03/16/2024 2100 by Alejos Husband, RN Outcome: Progressing   Problem: Clinical Measurements: Goal: Ability to maintain clinical measurements within normal limits will improve 03/17/2024 0453 by Alejos Husband, RN Outcome: Progressing 03/16/2024 2100 by Alejos Husband, RN Outcome: Progressing Goal: Will remain free from infection 03/17/2024 0453 by Alejos Husband, RN Outcome: Progressing 03/16/2024 2100 by Alejos Husband, RN Outcome: Progressing Goal: Diagnostic test results will improve 03/17/2024 0453 by Alejos Husband, RN Outcome: Progressing 03/16/2024 2100 by Alejos Husband, RN Outcome: Progressing Goal: Respiratory complications will improve 03/17/2024 0453 by Alejos Husband, RN Outcome: Progressing 03/16/2024 2100 by Alejos Husband, RN Outcome: Progressing Goal: Cardiovascular complication will be avoided 03/17/2024 0453 by Alejos Husband, RN Outcome: Progressing 03/16/2024 2100 by Alejos Husband, RN Outcome: Progressing   Problem: Activity: Goal: Risk for activity intolerance will decrease 03/17/2024 0453 by Alejos Husband, RN Outcome: Progressing 03/16/2024 2100 by Alejos Husband, RN Outcome: Progressing   Problem: Nutrition: Goal: Adequate nutrition will be maintained 03/17/2024 0453 by Alejos Husband, RN Outcome: Progressing 03/16/2024 2100 by Alejos Husband, RN Outcome: Progressing   Problem: Coping: Goal: Level of anxiety will decrease 03/17/2024  0453 by Alejos Husband, RN Outcome: Progressing 03/16/2024 2100 by Alejos Husband, RN Outcome: Progressing   Problem: Elimination: Goal: Will not experience complications related to bowel motility 03/17/2024 0453 by Alejos Husband, RN Outcome: Progressing 03/16/2024 2100 by Alejos Husband, RN Outcome: Progressing Goal: Will not experience complications related to urinary retention 03/17/2024 0453 by Alejos Husband, RN Outcome: Progressing 03/16/2024 2100 by Alejos Husband, RN Outcome: Progressing   Problem: Pain Managment: Goal: General experience of comfort will improve and/or be controlled 03/17/2024 0453 by Alejos Husband, RN Outcome: Progressing 03/16/2024 2100 by Alejos Husband, RN Outcome: Progressing   Problem: Safety: Goal: Ability to remain free from injury will improve 03/17/2024 0453 by Alejos Husband, RN Outcome: Progressing 03/16/2024 2100 by Alejos Husband, RN Outcome: Progressing   Problem: Skin Integrity: Goal: Risk for impaired skin integrity will decrease 03/17/2024 0453 by Alejos Husband, RN Outcome: Progressing 03/16/2024 2100 by Alejos Husband, RN Outcome: Progressing   Problem: Education: Goal: Understanding of CV disease, CV risk reduction, and recovery process will improve 03/17/2024 0453 by Alejos Husband, RN Outcome: Progressing 03/16/2024 2100 by Alejos Husband, RN Outcome: Progressing Goal: Individualized Educational Video(s) 03/17/2024 0453 by Alejos Husband, RN Outcome: Progressing 03/16/2024 2100 by Alejos Husband, RN Outcome: Progressing   Problem: Activity: Goal: Ability to return to baseline activity level will improve 03/17/2024 0453 by Alejos Husband, RN Outcome: Progressing 03/16/2024 2100 by Alejos Husband, RN Outcome: Progressing   Problem: Cardiovascular: Goal: Ability to achieve and maintain adequate cardiovascular perfusion will improve 03/17/2024 0453 by Alejos Husband,  RN Outcome: Progressing 03/16/2024 2100 by Alejos Husband, RN Outcome: Progressing Goal: Vascular access site(s) Level 0-1 will be maintained 03/17/2024 0453 by Alejos Husband, RN Outcome: Progressing 03/16/2024 2100 by Alejos Husband, RN Outcome: Progressing   Problem: Health Behavior/Discharge Planning: Goal: Ability to safely manage  health-related needs after discharge will improve 03/17/2024 0453 by Alejos Husband, RN Outcome: Progressing 03/16/2024 2100 by Alejos Husband, RN Outcome: Progressing

## 2024-03-17 NOTE — Progress Notes (Signed)
 STROKE TEAM PROGRESS NOTE   INTERIM HISTORY/SUBJECTIVE His wife is at the bedside.    Patient headache appears to be improving and appears quite comfortable.   .   INR is optimal at 2.6 and is medically stable to be transferred to rehab when bed available.  Vital signs stable.  Neurological exam unchanged.   OBJECTIVE  CBC    Component Value Date/Time   WBC 14.0 (H) 03/17/2024 0459   RBC 4.90 03/17/2024 0459   HGB 13.2 03/17/2024 0459   HCT 39.1 03/17/2024 0459   PLT 232 03/17/2024 0459   MCV 79.8 (L) 03/17/2024 0459   MCH 26.9 03/17/2024 0459   MCHC 33.8 03/17/2024 0459   RDW 12.9 03/17/2024 0459   LYMPHSABS 1.5 03/10/2024 1032   MONOABS 1.0 03/10/2024 1032   EOSABS 0.0 03/10/2024 1032   BASOSABS 0.0 03/10/2024 1032    BMET    Component Value Date/Time   NA 139 03/16/2024 0528   K 3.1 (L) 03/16/2024 0528   CL 111 03/16/2024 0528   CO2 20 (L) 03/16/2024 0528   GLUCOSE 117 (H) 03/16/2024 0528   BUN 11 03/16/2024 0528   CREATININE 1.08 03/16/2024 0528   CALCIUM  9.5 03/16/2024 0528   GFRNONAA >60 03/16/2024 0528    IMAGING past 24 hours No results found.    Vitals:   03/16/24 1930 03/16/24 2258 03/17/24 0320 03/17/24 0737  BP: 122/83 130/89 121/89 109/77  Pulse: 66 62 63 65  Resp: 15 12 14 12   Temp: 99.8 F (37.7 C) 98.5 F (36.9 C) 98.4 F (36.9 C) (!) 97.5 F (36.4 C)  TempSrc: Oral Oral Oral Axillary  SpO2: 97% 96% 98% 97%  Weight:      Height:         PHYSICAL EXAM General:  Alert, well-nourished, well-developed patient in no acute distress Psych:  Mood and affect appropriate for situation CV: Regular rate and rhythm on monitor Respiratory:  Regular, unlabored respirations on room air GI: Abdomen soft and nontender NEURO:  Pt is awake, alert, eyes open, smile to provider. No gaze palsy, tracking bilaterally, blinking to visual threat bilaterally, PERRL. No facial droop. Tongue midline. Bilateral UEs 5/5, no drift. Bilaterally LEs 5/5, no drift. b/l  FTN intact, gait not tested.     ASSESSMENT/PLAN  [name] is a 33 y.o. 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 CT head with no acute process. 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 CTA and CTV obtained. Heparin  gtt initiated. NIH on Admission 25  Worsening mental status, improved Patient seems more drowsy sleepy, hard to arouse on 4/29 CT repeat 4/29 showed increased cerebral edema Transferred to ICU for 3% saline with bolus S/p IR but not successful with IR Dr. Alvira Josephs Mental status much improved, back to basel ine now Tape down 3% saline  On LTM EEG - no seizure -> d/c  Persistent dural venous sinus thrombus:   Code Stroke CT head -  Unchanged small subacute left frontal lobe infarct in the setting of known dural venous sinus thrombosis. ASPECTS of 10. CTA head & neck No LVO CT Venogram Mild progression of extensive thrombus in the superior sagittal sinus posteriorly. Mild improvement of subocclusive thrombus in the  transverse and sigmoid sinuses bilaterally. Persistent bilateral cavernous sinus thrombosis. MRI  Evolving subacute venous ischemic changes involving the paramedian frontal lobes and likely  the left occipital lobe, not significantly changed as compared to previous. Mild associated petechial hemorrhage at the posterior left frontal region, stable from prior.  4/29- Stat Head CT generalized brain swelling.  On personal review, seems more cerebral edema than 4/28 CT head 03/15/2024-no significant change compared with CT 03/09/2024 3/18- 2D Echo - EF 45-50% in 01/2024-EEG- negative for seizure, mild to moderate diffuse encephalopathy LDL 58 HgbA1c 5.7 VTE prophylaxis - Heparin  IV Eliquis  (apixaban ) daily prior to admission, now on eliquis   Therapy recommendations:   CIR Disposition:  pending  Cerebral edema Transferred to ICU on 4/29 due to decreased LOC Hypertonic saline started at 37ml/hr->40cc -> 20cc for 12 hours and then d/c goal sodium 150-155 Serial neurochecks Q6 Na Na 144 --141--136-146-148-149-146 Add Na tab 2g tid Headaches- likely due to cerebral edema - topamax  stopped 5/1 due to low bicarb.  Put on Diamox  500mg  bid x2 and BMP tomorrow if still low bicarb will need to consider alternatives  Hx of CVST Admitted 02/01/2024 for CVST.  Status post IR for thrombectomy and discharged on Eliquis . Readmitted 03/02/2024 for right-sided weakness.  CT head showed increased left frontal venous infarct.  CTV showed recurrent occlusive and subocclusive thrombi within the SSS posteriorly, extending to the confluence of sinus.  Thrombus extending into the medial aspect of the right transverse sinus.  Clot burden within the right sigmoid sinus has decreased.  Patient was concerning for dehydration at the time, and received fluid resuscitation.  Discharged in good condition However, per wife, patient discharged on 4/25 Friday, at the night of that day, patient drowsy sleepy not talking.  Saturday some improvement but not eating and drinking well able to take medication.  Sunday she did a poorly, drowsy sleepy not talking not eating, however wife was able to get medication down.  Monday yesterday, he was sent to ER for evaluation.  Hx of Stroke Recent viral meningitis January 27, 2024 right frontal 2-3 punctate infarcts, etiology: Likely due to infectious vasculitis vs. Small vessel disease in the setting of CNS infection  CSF at that time WBC 22, RBC 1330, protein 91, glucose 80, concerning for viral meningitis Completed course of Doxy No need LP repeat at this time given no sign of CNS infection and especially with cerebral edema and low-lying cerebellar tonsils extending up to 9 mm below the foreman magnum  Hyperlipidemia Home meds:  Atorvastatin  80,  resumed LDL in 01/2024 279 This admission LDL 58, goal < 70 Continue statin on discharge  Dysphagia SLP consulted On dys 3 and thin liquid Advance diet as tolerated  Other Active Problems Hypokalemia K 3.2 - 2.9 - supplement -> 3.7  Hospital day # 9     His wife is at the bedside..  .  He continues to have slight improvement in his headache patient encouraged to eat well and maintain good hydration.  Continue warfarin.  Hopefully transfer to floor and rehab in few days.    Ardella Beaver, MD  To contact Stroke Continuity provider, please refer to [email or web address]. After hours, contact General Neurology

## 2024-03-17 NOTE — Progress Notes (Signed)
 Initial Nutrition Assessment  DOCUMENTATION CODES:   Not applicable  INTERVENTION:  Obtain new weight  Increase Ensure Enlive to TID, each supplement provides 350 kcal and 20 grams of protein. Multivitamin w/ minerals daily Encourage good PO intake  Allow family to bring outside food to help increase PO intake  Calorie Count per MD  NUTRITION DIAGNOSIS:   Inadequate oral intake related to decreased appetite as evidenced by meal completion < 25%.  GOAL:   Patient will meet greater than or equal to 90% of their needs  MONITOR:   PO intake, Supplement acceptance, Weight trends, I & O's  REASON FOR ASSESSMENT:   Consult Calorie Count  ASSESSMENT:   33 y.o. male presented to the ED with new onset R side weakness. PMH includes deaf, GERD, meningoencephalitis, and hyperlipidemia. Pt admitted with worsening mental status and persistent dural venous sinus thrombus.   4/28 - Admitted 4/29 - SLP evaluation, recommend NPO; s/p bilateral common carotid arteriograms & internal jugular venograms 4/30 - diet advanced to Dysphagia 3/thin liquids 5/05 - transferred to floor 5/06 - diet advanced to regular   RN and family at bedside at time of RD visit. Pt currently with a headache and RN providing care and preparing to get pt up. RN provides partial history over today and yesterday. No nausea or vomiting. Pt has not received an Ensure yet today, however half an Ensure complete from yesterday on bedside table.   Calorie Count Results from 5/06 Breakfast: 170 calories, 5 gm protein Lunch: No documentation Dinner: 135 calories, 8 gm protein  Supplements: 175 calories, 10 gm protein  Total intake: 480 kcal (25% of minimum estimated needs)  23 protein (26% of minimum estimated needs)  Meal Intakes 5/02: 10-25% x 2 meals 5/04: 0-10% x 2 meals 5/05: 15-20% x 2 meals  5/06: 25% x 2 meals  Per MD notes, has discussed need for feeding tube if PO intake remains poor.   No new weight  in chart since admission, recommend obtaining new weight to assess trends while admitted. RD to place order.   Medications reviewed and include: Protonix , Miralax , Senokot, Sodium Chloride  tablet, Warfarin, IV antibiotics  Labs reviewed. CBG: 115-142 x 24 hrs   UOP: 1600 mL x 24 hrs  NUTRITION - FOCUSED PHYSICAL EXAM: Deferred to follow-up due to RN providing care.   Diet Order:   Diet Order             Diet regular Room service appropriate? Yes with Assist; Fluid consistency: Thin  Diet effective now                   EDUCATION NEEDS:   No education needs have been identified at this time  Skin:  Skin Assessment: Reviewed RN Assessment  Last BM:  5/4  Height:  Ht Readings from Last 1 Encounters:  03/08/24 5\' 4"  (1.626 m)   Weight:  Wt Readings from Last 1 Encounters:  03/08/24 65 kg   Ideal Body Weight:  59.1 kg  BMI:  Body mass index is 24.6 kg/m.  Estimated Nutritional Needs:  Kcal:  1950-2150 Protein:  90-110 grams Fluid:  >/= 1.9 L   Doneta Furbish RD, LDN Clinical Dietitian

## 2024-03-18 ENCOUNTER — Other Ambulatory Visit: Payer: Self-pay

## 2024-03-18 ENCOUNTER — Encounter: Admitting: Occupational Therapy

## 2024-03-18 ENCOUNTER — Inpatient Hospital Stay (HOSPITAL_COMMUNITY)
Admission: AD | Admit: 2024-03-18 | Discharge: 2024-04-07 | DRG: 056 | Disposition: A | Discharge status: Home Health | Source: Intra-hospital | Attending: Physical Medicine and Rehabilitation | Admitting: Physical Medicine and Rehabilitation

## 2024-03-18 ENCOUNTER — Encounter (HOSPITAL_COMMUNITY): Payer: Self-pay | Admitting: Physical Medicine and Rehabilitation

## 2024-03-18 ENCOUNTER — Ambulatory Visit: Admitting: Physical Therapy

## 2024-03-18 ENCOUNTER — Inpatient Hospital Stay (HOSPITAL_COMMUNITY)

## 2024-03-18 DIAGNOSIS — Z79899 Other long term (current) drug therapy: Secondary | ICD-10-CM | POA: Diagnosis not present

## 2024-03-18 DIAGNOSIS — Z603 Acculturation difficulty: Secondary | ICD-10-CM | POA: Diagnosis present

## 2024-03-18 DIAGNOSIS — K219 Gastro-esophageal reflux disease without esophagitis: Secondary | ICD-10-CM | POA: Diagnosis present

## 2024-03-18 DIAGNOSIS — E876 Hypokalemia: Secondary | ICD-10-CM | POA: Diagnosis present

## 2024-03-18 DIAGNOSIS — R1310 Dysphagia, unspecified: Secondary | ICD-10-CM | POA: Diagnosis not present

## 2024-03-18 DIAGNOSIS — Z7901 Long term (current) use of anticoagulants: Secondary | ICD-10-CM | POA: Diagnosis not present

## 2024-03-18 DIAGNOSIS — H905 Unspecified sensorineural hearing loss: Secondary | ICD-10-CM | POA: Diagnosis present

## 2024-03-18 DIAGNOSIS — I959 Hypotension, unspecified: Secondary | ICD-10-CM | POA: Diagnosis not present

## 2024-03-18 DIAGNOSIS — I639 Cerebral infarction, unspecified: Secondary | ICD-10-CM | POA: Diagnosis not present

## 2024-03-18 DIAGNOSIS — I69393 Ataxia following cerebral infarction: Secondary | ICD-10-CM | POA: Diagnosis not present

## 2024-03-18 DIAGNOSIS — I636 Cerebral infarction due to cerebral venous thrombosis, nonpyogenic: Principal | ICD-10-CM

## 2024-03-18 DIAGNOSIS — I69351 Hemiplegia and hemiparesis following cerebral infarction affecting right dominant side: Principal | ICD-10-CM

## 2024-03-18 DIAGNOSIS — K5900 Constipation, unspecified: Secondary | ICD-10-CM

## 2024-03-18 DIAGNOSIS — G08 Intracranial and intraspinal phlebitis and thrombophlebitis: Secondary | ICD-10-CM | POA: Diagnosis present

## 2024-03-18 DIAGNOSIS — R569 Unspecified convulsions: Secondary | ICD-10-CM | POA: Diagnosis not present

## 2024-03-18 DIAGNOSIS — R109 Unspecified abdominal pain: Secondary | ICD-10-CM | POA: Diagnosis present

## 2024-03-18 DIAGNOSIS — D649 Anemia, unspecified: Secondary | ICD-10-CM | POA: Diagnosis present

## 2024-03-18 DIAGNOSIS — E785 Hyperlipidemia, unspecified: Secondary | ICD-10-CM | POA: Diagnosis present

## 2024-03-18 DIAGNOSIS — R531 Weakness: Secondary | ICD-10-CM | POA: Diagnosis not present

## 2024-03-18 DIAGNOSIS — I951 Orthostatic hypotension: Secondary | ICD-10-CM

## 2024-03-18 DIAGNOSIS — G936 Cerebral edema: Secondary | ICD-10-CM | POA: Diagnosis present

## 2024-03-18 DIAGNOSIS — R519 Headache, unspecified: Secondary | ICD-10-CM | POA: Diagnosis not present

## 2024-03-18 DIAGNOSIS — G9389 Other specified disorders of brain: Secondary | ICD-10-CM | POA: Diagnosis not present

## 2024-03-18 DIAGNOSIS — M436 Torticollis: Secondary | ICD-10-CM | POA: Diagnosis present

## 2024-03-18 LAB — CBC
HCT: 37.1 % — ABNORMAL LOW (ref 39.0–52.0)
Hemoglobin: 12.2 g/dL — ABNORMAL LOW (ref 13.0–17.0)
MCH: 26.7 pg (ref 26.0–34.0)
MCHC: 32.9 g/dL (ref 30.0–36.0)
MCV: 81.2 fL (ref 80.0–100.0)
Platelets: 228 10*3/uL (ref 150–400)
RBC: 4.57 MIL/uL (ref 4.22–5.81)
RDW: 13.1 % (ref 11.5–15.5)
WBC: 8.2 10*3/uL (ref 4.0–10.5)
nRBC: 0 % (ref 0.0–0.2)

## 2024-03-18 LAB — GLUCOSE, CAPILLARY
Glucose-Capillary: 110 mg/dL — ABNORMAL HIGH (ref 70–99)
Glucose-Capillary: 180 mg/dL — ABNORMAL HIGH (ref 70–99)
Glucose-Capillary: 89 mg/dL (ref 70–99)
Glucose-Capillary: 91 mg/dL (ref 70–99)
Glucose-Capillary: 122 mg/dL — ABNORMAL HIGH (ref 70–99)

## 2024-03-18 LAB — PROTIME-INR
INR: 2.3 — ABNORMAL HIGH (ref 0.8–1.2)
Prothrombin Time: 25.6 s — ABNORMAL HIGH (ref 11.4–15.2)

## 2024-03-18 LAB — APTT: aPTT: 41 s — ABNORMAL HIGH (ref 24–36)

## 2024-03-18 MED ORDER — POLYETHYLENE GLYCOL 3350 17 G PO PACK
17.0000 g | PACK | Freq: Two times a day (BID) | ORAL | Status: DC
  Administered 2024-03-18 – 2024-04-07 (×37): 17 g via ORAL
  Filled 2024-03-18 (×38): qty 1

## 2024-03-18 MED ORDER — ATORVASTATIN CALCIUM 80 MG PO TABS
80.0000 mg | ORAL_TABLET | Freq: Every day | ORAL | Status: DC
  Administered 2024-03-19 – 2024-04-07 (×20): 80 mg via ORAL
  Filled 2024-03-18 (×20): qty 1

## 2024-03-18 MED ORDER — DIVALPROEX SODIUM 500 MG PO DR TAB
500.0000 mg | DELAYED_RELEASE_TABLET | Freq: Two times a day (BID) | ORAL | Status: DC
  Administered 2024-03-18 – 2024-03-24 (×12): 500 mg via ORAL
  Filled 2024-03-18 (×12): qty 1

## 2024-03-18 MED ORDER — BOOST / RESOURCE BREEZE PO LIQD CUSTOM
1.0000 | Freq: Three times a day (TID) | ORAL | Status: DC
  Administered 2024-03-18 – 2024-03-30 (×19): 1 via ORAL

## 2024-03-18 MED ORDER — WARFARIN SODIUM 4 MG PO TABS: 4.0000 mg | ORAL_TABLET | Freq: Once | ORAL | Status: DC

## 2024-03-18 MED ORDER — ALUM & MAG HYDROXIDE-SIMETH 200-200-20 MG/5ML PO SUSP: 30.0000 mL | ORAL | Status: DC | PRN

## 2024-03-18 MED ORDER — BUTALBITAL-APAP-CAFFEINE 50-325-40 MG PO TABS
1.0000 | ORAL_TABLET | Freq: Three times a day (TID) | ORAL | Status: DC | PRN
  Administered 2024-03-19 (×2): 1 via ORAL
  Filled 2024-03-18 (×2): qty 1

## 2024-03-18 MED ORDER — SODIUM CHLORIDE 1 G PO TABS: 2.0000 g | ORAL_TABLET | Freq: Three times a day (TID) | ORAL | Status: DC

## 2024-03-18 MED ORDER — ACETAMINOPHEN 160 MG/5ML PO SOLN: 650.0000 mg | ORAL | Status: DC | PRN

## 2024-03-18 MED ORDER — ONDANSETRON HCL 4 MG PO TABS
4.0000 mg | ORAL_TABLET | Freq: Four times a day (QID) | ORAL | Status: DC | PRN
  Administered 2024-03-27: 4 mg via ORAL
  Filled 2024-03-18: qty 1

## 2024-03-18 MED ORDER — WARFARIN SODIUM 4 MG PO TABS
4.0000 mg | ORAL_TABLET | Freq: Once | ORAL | Status: AC
  Administered 2024-03-18: 4 mg via ORAL
  Filled 2024-03-18: qty 1

## 2024-03-18 MED ORDER — ACETAMINOPHEN 325 MG PO TABS
650.0000 mg | ORAL_TABLET | ORAL | Status: DC | PRN
  Administered 2024-03-20 – 2024-04-07 (×34): 650 mg via ORAL
  Filled 2024-03-18 (×35): qty 2

## 2024-03-18 MED ORDER — ACETAMINOPHEN 650 MG RE SUPP
650.0000 mg | RECTAL | Status: DC | PRN
  Filled 2024-03-18: qty 1

## 2024-03-18 MED ORDER — WARFARIN - PHARMACIST DOSING INPATIENT: Freq: Every day | Status: DC

## 2024-03-18 MED ORDER — ACETAMINOPHEN 325 MG PO TABS: 650.0000 mg | ORAL_TABLET | ORAL | Status: DC | PRN

## 2024-03-18 MED ORDER — CHLORPROMAZINE HCL 10 MG PO TABS: 10.0000 mg | ORAL_TABLET | Freq: Three times a day (TID) | ORAL | Status: DC | PRN

## 2024-03-18 MED ORDER — SODIUM CHLORIDE 1 G PO TABS
2.0000 g | ORAL_TABLET | Freq: Three times a day (TID) | ORAL | Status: DC
  Administered 2024-03-19 – 2024-04-07 (×56): 2 g via ORAL
  Filled 2024-03-18 (×58): qty 2

## 2024-03-18 MED ORDER — BOOST / RESOURCE BREEZE PO LIQD CUSTOM
1.0000 | Freq: Three times a day (TID) | ORAL | Status: DC
  Administered 2024-03-18: 1 via ORAL

## 2024-03-18 MED ORDER — ADULT MULTIVITAMIN W/MINERALS CH
1.0000 | ORAL_TABLET | Freq: Every day | ORAL | Status: DC
  Administered 2024-03-19 – 2024-04-07 (×20): 1 via ORAL
  Filled 2024-03-18 (×20): qty 1

## 2024-03-18 MED ORDER — DIVALPROEX SODIUM 500 MG PO DR TAB: 500.0000 mg | DELAYED_RELEASE_TABLET | Freq: Two times a day (BID) | ORAL | Status: DC

## 2024-03-18 MED ORDER — PANTOPRAZOLE SODIUM 40 MG PO TBEC
40.0000 mg | DELAYED_RELEASE_TABLET | Freq: Every day | ORAL | Status: DC
  Administered 2024-03-19 – 2024-04-07 (×20): 40 mg via ORAL
  Filled 2024-03-18 (×19): qty 1

## 2024-03-18 MED ORDER — METHYLPREDNISOLONE 4 MG PO TBPK
4.0000 mg | ORAL_TABLET | Freq: Four times a day (QID) | ORAL | Status: AC
  Administered 2024-03-18: 4 mg via ORAL

## 2024-03-18 MED ORDER — SENNA 8.6 MG PO TABS
1.0000 | ORAL_TABLET | Freq: Two times a day (BID) | ORAL | Status: DC
  Administered 2024-03-18 – 2024-04-07 (×40): 8.6 mg via ORAL
  Filled 2024-03-18 (×40): qty 1

## 2024-03-18 MED ORDER — ORAL CARE MOUTH RINSE: 15.0000 mL | OROMUCOSAL | Status: DC | PRN

## 2024-03-18 NOTE — Progress Notes (Addendum)
 Inpatient Rehab Admissions Coordinator:   I have insurance approval and a bed for this patient to admit to CIR today.  Plan to meet at bedside with pt/family, Dr. [name], and interpreters at 1300 today to confirm.   1342: Met with pt and family and interpreters at bedside and all are in agreement to proceed with admit today if cleared medically.    Loye Rumble, PT, DPT Admissions Coordinator [phone] 03/18/24  10:54 AM

## 2024-03-18 NOTE — Discharge Instructions (Addendum)
 Inpatient Rehab Discharge Instructions  Samuel [facility] Discharge date and time: No discharge date for patient encounter.   Activities/Precautions/ Functional Status: Activity: As tolerated Diet: Regular Wound Care: Routine skin checks Functional status:  ___ No restrictions     ___ Walk up steps independently ___ 24/7 supervision/assistance   ___ Walk up steps with assistance ___ Intermittent supervision/assistance  ___ Bathe/dress independently ___ Walk with walker     _x__ Bathe/dress with assistance ___ Walk Independently    ___ Shower independently ___ Walk with assistance    ___ Shower with assistance ___ No alcohol     ___ Return to work/school ________  Special Instructions: No driving smoking or alcohol  Follow-up office [facility] for Coumadin  therapy weekly checks of INR [phone] fax [phone]  COMMUNITY REFERRALS UPON DISCHARGE:    Home Health:   PT   &   OT PT can draw INR                  Agency:[facility] Phone:[phone]    Medical Equipment/Items Ordered:HOSPITAL BED AND HOYER LIFT                                                 Agency/Supplier:[facility]    [phone]   My questions have been answered and I understand these instructions. I will adhere to these goals and the provided educational materials after my discharge from the hospital.  Patient/Caregiver Signature _______________________________ Date __________  Clinician Signature _______________________________________ Date __________  Please bring this form and your medication list with you to all your follow-up doctor's appointments.    __________________________________________________________________________________________________________________________ Information on my medicine - Coumadin    (Warfarin)  This medication education was reviewed with me or my healthcare representative as part of my discharge preparation.    Why was Coumadin  prescribed for  you? Coumadin  was prescribed for you because you have a blood clot or a medical condition that can cause an increased risk of forming blood clots. Blood clots can cause serious health problems by blocking the flow of blood to the heart, lung, or brain. Coumadin  can prevent harmful blood clots from forming. As a reminder your indication for Coumadin  is:  History of sinus thrombosis (blood clot).  What test will check on my response to Coumadin ? While on Coumadin  (warfarin) you will need to have an INR test regularly to ensure that your dose is keeping you in the desired range. The INR (international normalized ratio) number is calculated from the result of the laboratory test called prothrombin time (PT).  If an INR APPOINTMENT HAS NOT ALREADY BEEN MADE FOR YOU please schedule an appointment to have this lab work done by your health care provider within 7 days. Your INR goal is usually a number between:  2 to 3 or your provider may give you a more narrow range like 2-2.5.  Ask your health care provider during an office visit what your goal INR is.  What  do you need to  know  About  COUMADIN ? Take Coumadin  (warfarin) exactly as prescribed by your healthcare provider about the same time each day.  DO NOT stop taking without talking to the doctor who prescribed the medication.  Stopping without other blood clot prevention medication to take the place of Coumadin  may increase your risk of developing a new clot or stroke.  Get refills before you run out.  What do you do if you miss a dose? If you miss a dose, take it as soon as you remember on the same day then continue your regularly scheduled regimen the next day.  Do not take two doses of Coumadin  at the same time.  Important Safety Information A possible side effect of Coumadin  (Warfarin) is an increased risk of bleeding. You should call your healthcare provider right away if you experience any of the following: Bleeding from an injury or your nose  that does not stop. Unusual colored urine (red or dark brown) or unusual colored stools (red or black). Unusual bruising for unknown reasons. A serious fall or if you hit your head (even if there is no bleeding).  Some foods or medicines interact with Coumadin  (warfarin) and might alter your response to warfarin. To help avoid this: Eat a balanced diet, maintaining a consistent amount of Vitamin K. Notify your provider about major diet changes you plan to make. Avoid alcohol or limit your intake to 1 drink for women and 2 drinks for men per day. (1 drink is 5 oz. wine, 12 oz. beer, or 1.5 oz. liquor.)  Make sure that ANY health care provider who prescribes medication for you knows that you are taking Coumadin  (warfarin).  Also make sure the healthcare provider who is monitoring your Coumadin  knows when you have started a new medication including herbals and non-prescription products.  Coumadin  (Warfarin)  Major Drug Interactions  Increased Warfarin Effect Decreased Warfarin Effect  Alcohol (large quantities) Antibiotics (esp. Septra /Bactrim , Flagyl, Cipro) Amiodarone (Cordarone) Aspirin  (ASA) Cimetidine (Tagamet) Megestrol (Megace) NSAIDs (ibuprofen , naproxen, etc.) Piroxicam (Feldene) Propafenone (Rythmol SR) Propranolol (Inderal) Isoniazid (INH) Posaconazole (Noxafil) Barbiturates (Phenobarbital) Carbamazepine (Tegretol) Chlordiazepoxide (Librium) Cholestyramine (Questran) Griseofulvin Oral Contraceptives Rifampin Sucralfate (Carafate) Vitamin K   Coumadin  (Warfarin) Major Herbal Interactions  Increased Warfarin Effect Decreased Warfarin Effect  Garlic Ginseng Ginkgo biloba Coenzyme Q10 Green tea St. John's wort    Coumadin  (Warfarin) FOOD Interactions  Eat a consistent number of servings per week of foods HIGH in Vitamin K (1 serving =  cup)  Collards (cooked, or boiled & drained) Kale (cooked, or boiled & drained) Mustard greens (cooked, or boiled &  drained) Parsley *serving size only =  cup Spinach (cooked, or boiled & drained) Swiss chard (cooked, or boiled & drained) Turnip greens (cooked, or boiled & drained)  Eat a consistent number of servings per week of foods MEDIUM-HIGH in Vitamin K (1 serving = 1 cup)  Asparagus (cooked, or boiled & drained) Broccoli (cooked, boiled & drained, or raw & chopped) Brussel sprouts (cooked, or boiled & drained) *serving size only =  cup Lettuce, raw (green leaf, endive, romaine) Spinach, raw Turnip greens, raw & chopped   These websites have more information on Coumadin  (warfarin):  www.coumadin .com; www.ahrq.gov/consumer/coumadin .htm;

## 2024-03-18 NOTE — Progress Notes (Signed)
 Transferred patient safely to 4MW13, via wheelchair. All personal belongings taken to new room by patient's spouse. Patient settled in the bed, call bell next to him, bed alarm on. Receiving staff aware that patient is in the room. Report given to receiving RN. The rest of patient's family made aware of the room number and RN escorted them to new room.

## 2024-03-18 NOTE — H&P (Signed)
 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  Per chart review patient lives with & family.  1 level home 7 steps to entry.  Independent prior to admission working at [employer] and he has been receiving outpatient therapies.  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  He was admitted to tab by neurology services for workup and discharged to home on Eliquis .  Presented 03/08/2024 after a witnessed fall and remained on the floor until EMS was dispatched with noted right side weakness left gaze preference.  Reportedly patient had 2 apneic spells with EMS and required transient BVM.  CT/MRI showed persistent findings of extensive dural venous sinus thrombosis, better characterized on CT venogram performed the same day.  Evolving subacute venous ischemic changes involving the paramedian frontal lobes and likely the left occipital lobe, not significantly changed as compared to previous.  Mild associated petechial hemorrhage at the posterior left frontal region, stable from prior.  No frank hemorrhagic transformation or significant mass effect.  EEG negative for seizure.  Most recent echocardiogram with ejection fraction of 45 to 50% the left ventricle demonstrating regional wall motion abnormality.  Admission chemistries unremarkable except WBC 12,600, glucose 120, total CK of 262.  Underwent bilateral common carotid arteriograms and  internal jugular venograms left CFA approach and right common femoral vein approach that showed absent flow noted no posterior one third the superior sagittal sinus, the transverse sinuses of the sigmoid sinuses bilaterally including the internal jugular veins proximally.  Multiple attempts at revascularization unsuccessful.  No need for LP at this time given no sign of CNS infection especially with cerebral edema and low-lying cerebellar tonsils extending up to 9 mm below the foramen magnum.  Neurology follow-up initially placed on IV heparin  as well as 3% saline with bolus and has been transitioned back to Eliquis  and changed to Coumadin 03/17/2024 for persistent dural venous sinus thrombus.  Hospital course persistent bouts of headaches initially on Topamax  stopped due to low bicarb and transition to Medrol Dosepak and Depacon.  He is on a regular diet now.   Therapy evaluations completed due to patient's decreased functional mobility was admitted for a comprehensive rehab program.     Pt reports having mild to moderate Abdominal pain as well as a HA- which is 6/10- down from 7-8/10.  Using purewick for voiding and LBM yesterday Denies any other Sx's other than weakness, ataxia, and HA's   Review of Systems  Constitutional:  Negative for chills and fever.  HENT:         Deafness  Eyes:  Negative for blurred vision and double vision.  Respiratory:  Negative for cough, shortness of breath and wheezing.   Cardiovascular:  Negative for chest pain, palpitations and leg swelling.  Gastrointestinal:  Positive for nausea.  Genitourinary:  Negative for dysuria, flank pain and hematuria.  Musculoskeletal:  Positive for myalgias.  Neurological:  Positive for headaches.  All other systems reviewed and are negative.  Past Medical History:  Diagnosis Date   Deaf               Past Surgical History:  Procedure Laterality Date   IR ANGIO INTRA EXTRACRAN SEL COM CAROTID INNOMINATE BILAT MOD SED    02/02/2024   IR ANGIO INTRA EXTRACRAN SEL COM CAROTID INNOMINATE BILAT MOD SED   03/09/2024   IR CT HEAD LTD   03/09/2024   IR THROMBECT VENO MECH MOD SED   02/02/2024   IR THROMBECT VENO MECH MOD SED   03/09/2024   IR VENO SAGITTAL SINUS   03/09/2024   IR VENO/JUGULAR LEFT   02/02/2024   RADIOLOGY WITH ANESTHESIA N/A 02/02/2024    Procedure: RADIOLOGY WITH ANESTHESIA;  Surgeon: Luellen Sages, MD;  Location: [facility] OR;  Service: Radiology;  Laterality: N/A;   RADIOLOGY WITH ANESTHESIA N/A 03/09/2024    Procedure: RADIOLOGY WITH ANESTHESIA;  Surgeon: Luellen Sages, MD;  Location: [facility] OR;  Service: Radiology;  Laterality: N/A;        History reviewed. No pertinent family history.     Social History:  reports that he has never smoked. He has never used smokeless tobacco. He reports that he does not currently use alcohol. He reports that he does not currently use drugs. Allergies:  Allergies  No Known Allergies         Medications Prior to Admission  Medication Sig Dispense Refill   apixaban  (ELIQUIS ) 5 MG TABS tablet Take 1 tablet (5 mg total) by mouth 2 (two) times daily. 60 tablet 0   atorvastatin  (LIPITOR ) 80 MG tablet Take 1 tablet (80 mg total) by mouth daily. 30 tablet 0   butalbital -acetaminophen -caffeine  (FIORICET ) 50-325-40 MG tablet Take 1 tablet by mouth every 6 (six) hours as needed (refractory HA.). 14 tablet 0   ondansetron  (ZOFRAN ) 4 MG tablet Take 1 tablet (4 mg total) by mouth every 6 (six) hours as needed for nausea. 20 tablet 0   pantoprazole  (PROTONIX ) 40 MG tablet Take 1 tablet (40 mg total) by mouth daily. (Patient taking differently: Take 40 mg by mouth daily before breakfast.) 30 tablet 0   topiramate  (TOPAMAX ) 25 MG tablet Take 1 tablet (25 mg total) by mouth daily. 30 tablet 0              Home: Home Living Family/patient expects to be discharged to:: Private residence Living Arrangements: Parent, Other relatives Available Help at Discharge: Family,  Available 24 hours/day Type of Home: House Home Access: Stairs to enter [employer] of Steps: 7 Entrance Stairs-Rails: Left, Right Home Layout: One level Bathroom Shower/Tub: Engineer, manufacturing systems: Standard Home Equipment: Agricultural consultant (2 wheels)   Functional History: Prior Function Prior Level of Function : Needs assist Mobility Comments: RW ADLs Comments: working with OP OT PTA, supervision for ADLs per prior therapy note, working at [employer] prior to previous 2 hospitalizations   Functional Status:  Mobility: Bed Mobility Overal bed mobility: Needs Assistance Bed Mobility: Supine to Sit Sidelying to sit: Mod assist, +2 for physical assistance, HOB elevated Supine to sit: HOB elevated, Min assist, Used rails Sit to supine: Max assist General bed mobility comments: Verbal, visual, and tactile cues provided to bring R leg off L EOB and to bring bil UEs to L bedrail to pull to sit up. Pt able to initiate pulling up to sit but would often get weak > half way up and lay back down on his L side, needing minA to complete the transition  to ascend his trunk to sit up. Transfers Overall transfer level: Needs assistance Equipment used: Ambulation equipment used Transfers: Sit to/from Stand, Bed to chair/wheelchair/BSC Sit to Stand: Mod assist, Min assist Bed to/from chair/wheelchair/BSC transfer type:: Via Lift equipment Step pivot transfers: Mod assist, +2 physical assistance Transfer via Lift Equipment: Stedy General transfer comment: Pt demonstrated less posterior lean but continued R lateral lean with hands on stedy bars. He required modA to transfer to stand using the stedy the first x2 reps but progressed to minA the final x3 reps as the pt began to use visual targets to get his abdomen to the center of the stedy bar to reduce his posterior and R lateral lean. Stedy utilized to transfer pt OOB to the recliner. Ambulation/Gait Ambulation/Gait  assistance: Mod assist, Max assist, +2 physical assistance Gait Distance (Feet): 4 Feet Assistive device: Rolling walker (2 wheels) Gait Pattern/deviations: Step-to pattern, Decreased stride length, Decreased dorsiflexion - right, Decreased step length - right General Gait Details: deferred today to focus on improving his midline balance Gait velocity: Decreased Pre-gait activities: maxA to side step to left side for 2'   ADL: ADL Overall ADL's : Needs assistance/impaired Eating/Feeding: Minimal assistance, Sitting Grooming: Wash/dry face, Set up, Sitting Upper Body Bathing: Moderate assistance, Sitting, Bed level Lower Body Bathing: Maximal assistance, Sitting/lateral leans, Bed level Upper Body Dressing : Moderate assistance, Sitting, Bed level Lower Body Dressing: Maximal assistance, Sitting/lateral leans, Sit to/from stand Toilet Transfer: Maximal assistance, +2 for physical assistance, Moderate assistance Toileting- Clothing Manipulation and Hygiene: Maximal assistance Functional mobility during ADLs: Moderate assistance, Maximal assistance, +2 for physical assistance   Cognition: Cognition Orientation Level: Other (comment) Cognition Arousal: Alert Behavior During Therapy: WFL for tasks assessed/performed   Physical Exam: Blood pressure 93/72, pulse 61, temperature 97.8 F (36.6 C), temperature source Axillary, resp. rate 13, height 5\' 4"  (1.626 m), weight 65 kg, SpO2 98%. Physical Exam Vitals and nursing note reviewed. Exam conducted with a chaperone present.  Constitutional:      Appearance: Normal appearance. He is normal weight.     Comments: Pt, wife, and 2 interpretors are in room- wife doesn't speak English, nor pt; pt is deaf, awake, alert, NAD  HENT:     Head: Normocephalic and atraumatic.     Comments: Facial sensation intact to light touch B/L  Tongue midline- smile appears equal    Right Ear: External ear normal.     Left Ear: External ear normal.     Nose:  Nose normal.     Mouth/Throat:     Mouth: Mucous membranes are dry.     Pharynx: Oropharynx is clear. No oropharyngeal exudate.  Eyes:     General:        Right eye: No discharge.        Left eye: No discharge.     Extraocular Movements: Extraocular movements intact.     Comments: No nystagmus  Cardiovascular:     Rate and Rhythm: Normal rate and regular rhythm.     Heart sounds: Normal heart sounds. No murmur heard.    No gallop.  Pulmonary:     Effort: Pulmonary effort is normal. No respiratory distress.     Breath sounds: Normal breath sounds. No wheezing, rhonchi or rales.  Abdominal:     General: Bowel sounds are normal. There is no distension.     Palpations: Abdomen is soft.     Tenderness: There is abdominal tenderness. There is no guarding.     Comments:  Slightly hypoacitve  Genitourinary:    Comments: Purewick with medium amber urine in cannister Musculoskeletal:        General: Normal range of motion.     Cervical back: Neck supple. No tenderness.     Comments:  Basically 5-5/5 in all 4 extremities- but hard to test all muscles due to deafness and language barrier  Skin:    General: Skin is warm and dry.     Comments:  IV out of neck is gone- wounds healing  Neurological:     Mental Status: He is alert. He is disoriented.     Comments: [name] makes eye contact with examiner.  Exam limited by language barrier.  Follows simple demonstrated commands.  His brother who was in the room does help to translate. Mild ataxia Mild R inattention Mild ot moderate searching pattern on B/L finger to nose  Psychiatric:        Mood and Affect: Mood normal.        Behavior: Behavior normal.        Lab Results Last 48 Hours        Results for orders placed or performed during the hospital encounter of 03/08/24 (from the past 48 hours)  Basic metabolic panel     Status: Abnormal    Collection Time: 03/16/24  5:28 AM  Result Value Ref Range    Sodium 139 135 - 145  mmol/L    Potassium 3.1 (L) 3.5 - 5.1 mmol/L    Chloride 111 98 - 111 mmol/L    CO2 20 (L) 22 - 32 mmol/L    Glucose, Bld 117 (H) 70 - 99 mg/dL      Comment: Glucose reference range applies only to samples taken after fasting for at least 8 hours.    BUN 11 6 - 20 mg/dL    Creatinine, Ser 1.61 0.61 - 1.24 mg/dL    Calcium  9.5 8.9 - 10.3 mg/dL    GFR, Estimated >09 >60 mL/min      Comment: (NOTE) Calculated using the CKD-EPI Creatinine Equation (2021)      Anion gap 8 5 - 15      Comment: Performed at [facility], [address], [place], [place] [address]  Protime-INR     Status: Abnormal    Collection Time: 03/16/24  5:28 AM  Result Value Ref Range    Prothrombin Time 30.8 (H) 11.4 - 15.2 seconds    INR 2.9 (H) 0.8 - 1.2      Comment: (NOTE) INR goal varies based on device and disease states. Performed at [facility], [address], [place], [place] [address]    APTT     Status: Abnormal    Collection Time: 03/16/24  5:28 AM  Result Value Ref Range    aPTT 95 (H) 24 - 36 seconds      Comment:        IF BASELINE aPTT IS ELEVATED, SUGGEST PATIENT RISK ASSESSMENT BE USED TO DETERMINE APPROPRIATE ANTICOAGULANT THERAPY. Performed at [facility], [address] [place], [place] [address]    Heparin  level (unfractionated)     Status: None    Collection Time: 03/16/24  5:28 AM  Result Value Ref Range    Heparin  Unfractionated 0.64 0.30 - 0.70 IU/mL      Comment: (NOTE) The clinical reportable range upper limit is being lowered to >1.10 to align with the FDA approved guidance for the current laboratory assay.  If heparin  results are below expected values, and patient dosage has  been confirmed, suggest follow up testing of antithrombin III levels. Performed at [facility], [address] [place], [place] [address]    CBC     Status: Abnormal    Collection Time: 03/16/24  5:28 AM  Result Value Ref Range    WBC 8.5 4.0 - 10.5 K/uL    RBC  4.98 4.22 - 5.81 MIL/uL    Hemoglobin 13.4 13.0 - 17.0 g/dL    HCT 60.4 54.0 - 98.1 %    MCV 79.9 (L) 80.0 - 100.0 fL    MCH 26.9 26.0 - 34.0 pg    MCHC 33.7 30.0 - 36.0 g/dL    RDW 19.1 47.8 - 29.5 %    Platelets 244 150 - 400 K/uL    nRBC 0.0 0.0 - 0.2 %      Comment: Performed at [facility], [address] [place], [place] [address]  Glucose, capillary     Status: Abnormal    Collection Time: 03/16/24  7:58 AM  Result Value Ref Range    Glucose-Capillary 122 (H) 70 - 99 mg/dL      Comment: Glucose reference range applies only to samples taken after fasting for at least 8 hours.  Glucose, capillary     Status: Abnormal    Collection Time: 03/16/24 11:46 AM  Result Value Ref Range    Glucose-Capillary 141 (H) 70 - 99 mg/dL      Comment: Glucose reference range applies only to samples taken after fasting for at least 8 hours.  Glucose, capillary     Status: Abnormal    Collection Time: 03/16/24  4:32 PM  Result Value Ref Range    Glucose-Capillary 118 (H) 70 - 99 mg/dL      Comment: Glucose reference range applies only to samples taken after fasting for at least 8 hours.  Glucose, capillary     Status: Abnormal    Collection Time: 03/16/24  9:06 PM  Result Value Ref Range    Glucose-Capillary 142 (H) 70 - 99 mg/dL      Comment: Glucose reference range applies only to samples taken after fasting for at least 8 hours.  Protime-INR     Status: Abnormal    Collection Time: 03/17/24  4:59 AM  Result Value Ref Range    Prothrombin Time 27.7 (H) 11.4 - 15.2 seconds    INR 2.6 (H) 0.8 - 1.2      Comment: (NOTE) INR goal varies based on device and disease states. Performed at [facility], [address], [place], [place] [address]    APTT     Status: Abnormal    Collection Time: 03/17/24  4:59 AM  Result Value Ref Range    aPTT 40 (H) 24 - 36 seconds      Comment:        IF BASELINE aPTT IS ELEVATED, SUGGEST PATIENT RISK ASSESSMENT BE USED TO DETERMINE  APPROPRIATE ANTICOAGULANT THERAPY. Performed at [facility], [address], [place], [place] [address]    CBC     Status: Abnormal    Collection Time: 03/17/24  4:59 AM  Result Value Ref Range    WBC 14.0 (H) 4.0 - 10.5 K/uL    RBC 4.90 4.22 - 5.81 MIL/uL    Hemoglobin 13.2 13.0 - 17.0 g/dL    HCT 95.2 84.1 - 32.4 %    MCV 79.8 (L)  80.0 - 100.0 fL    MCH 26.9 26.0 - 34.0 pg    MCHC 33.8 30.0 - 36.0 g/dL    RDW 16.1 09.6 - 04.5 %    Platelets 232 150 - 400 K/uL    nRBC 0.0 0.0 - 0.2 %      Comment: Performed at [facility], [address], [place], [place] [address]  Glucose, capillary     Status: Abnormal    Collection Time: 03/17/24  7:34 AM  Result Value Ref Range    Glucose-Capillary 116 (H) 70 - 99 mg/dL      Comment: Glucose reference range applies only to samples taken after fasting for at least 8 hours.  Glucose, capillary     Status: Abnormal    Collection Time: 03/17/24 11:55 AM  Result Value Ref Range    Glucose-Capillary 185 (H) 70 - 99 mg/dL      Comment: Glucose reference range applies only to samples taken after fasting for at least 8 hours.  Glucose, capillary     Status: Abnormal    Collection Time: 03/17/24  4:48 PM  Result Value Ref Range    Glucose-Capillary 164 (H) 70 - 99 mg/dL      Comment: Glucose reference range applies only to samples taken after fasting for at least 8 hours.  Glucose, capillary     Status: Abnormal    Collection Time: 03/17/24  9:14 PM  Result Value Ref Range    Glucose-Capillary 139 (H) 70 - 99 mg/dL      Comment: Glucose reference range applies only to samples taken after fasting for at least 8 hours.      Imaging Results (Last 48 hours)  No results found.           Blood pressure 93/72, pulse 61, temperature 97.8 F (36.6 C), temperature source Axillary, resp. rate 13, height 5\' 4"  (1.626 m), weight 65 kg, SpO2 98%.   Medical Problem List and Plan: 1. Functional deficits secondary to persistent dural  venous sinus thrombosis             -patient may  shower             -ELOS/Goals: min A- 18-21 days             Admit to CIR 2.  Antithrombotics: -DVT/anticoagulation:  Pharmaceutical: Coumadin             -antiplatelet therapy: N/A 3. Pain Management: Fioricet  as needed for refractory headaches, Tylenol  as needed 4. Mood/Behavior/Sleep: Provide emotional support             -antipsychotic agents: N/A 5. Neuropsych/cognition: This patient is capable of making decisions on his own behalf. 6. Skin/Wound Care: Routine skin checks 7. Fluids/Electrolytes/Nutrition: Routine in and outs with follow-up chemistries 8.  Cerebral edema.  Hypertonic saline discontinued 9.  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  Status post IR for thrombectomy was discharged on Eliquis  10.  History of stroke/recent viral meningitis.  Completed course of Doxy 11.  Refractory headaches.  Medrol Dosepak.  Topamax  discontinued due to low bicarb- might need to start Elavil, since HA's still 6/10 12.  Hyperlipidemia.  Lipitor  13.  Congenital Deafness.  Patient does use some sign language- but mainly gestures per ASL interpretor 14.  GERD.  Protonix      [name] [name], PA-C 03/18/2024   I have personally performed a face to face diagnostic evaluation of this patient and formulated the key components  of the plan.  Additionally, I have personally reviewed laboratory data, imaging studies, as well as relevant notes and concur with the physician assistant's documentation above.   The patient's status has not changed from the original H&P.  Any changes in documentation from the acute care chart have been noted above.

## 2024-03-18 NOTE — Plan of Care (Signed)

## 2024-03-18 NOTE — Discharge Instructions (Signed)

## 2024-03-18 NOTE — Discharge Summary (Signed)
 Physician Discharge Summary  Patient ID: Samuel Warren MRN: [id number] DOB/AGE: 1991-03-05 33 y.o.  Admit date: 03/18/2024 Discharge date: 04/07/2024  Discharge Diagnoses:  Principal Problem:   Dural venous sinus thrombosis Active Problems:   Orthostatic hypotension   Constipation DVT prophylaxis Cerebral edema History of CVST History of CVA/recent viral meningitis Refractory headaches Hyperlipidemia Congenital deafness GERD  Discharged Condition: Stable  Significant Diagnostic Studies: MR BRAIN WO CONTRAST Result Date: 03/24/2024 CLINICAL DATA:  Dural venous sinus thrombosis EXAM: MRI HEAD WITHOUT CONTRAST TECHNIQUE: Multiplanar, multiecho pulse sequences of the brain and surrounding structures were obtained without intravenous contrast. COMPARISON:  CTA head neck 03/23/2024 Brain MRI 03/08/2024 FINDINGS: Brain: No acute infarct. Susceptibility weighted imaging shows diffuse cortical venous congestion. Unchanged region of gliosis within the superior paramedian left frontal lobe. Cerebellar tonsils extend 7 mm below the foramen magnum. Vascular: Abnormal flow voids of the bilateral sigmoid and transverse sinuses in the superior sagittal sinus. Arterial flow voids are normal. Skull and upper cervical spine: Normal calvarium and skull base. Visualized upper cervical spine and soft tissues are normal. Sinuses/Orbits:No paranasal sinus fluid levels or advanced mucosal thickening. No mastoid or middle ear effusion. Normal orbits. IMPRESSION: 1. No acute infarct. 2. Abnormal flow voids of the bilateral sigmoid and transverse sinuses and the superior sagittal sinus, consistent with known dural venous sinus thrombosis. 3. Unchanged region of gliosis within the superior paramedian left frontal lobe. 4. 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 Electronically Signed   By: Juanetta Nordmann M.D.   On: 03/24/2024 21:11   Overnight EEG with video Result  Date: 03/24/2024 Arleene Lack, MD     03/24/2024  8:15 AM Patient Name: Samuel Warren MRN: [id number] Epilepsy Attending: Arleene Lack Referring Physician/Provider: Augustin Leber, MD Duration: 03/23/2024 2231 to 03/24/2024 0813 Patient history: 33 y.o. male with right sided motor fluctuations in the setting of severe venous sinus thrombosis. EEG to evaluate for seizure Level of alertness: Awake, asleep AEDs during EEG study: VPA Technical aspects: This EEG study was done with scalp electrodes positioned according to the 10-20 International system of electrode placement. Electrical activity was reviewed with band pass filter of 1-70Hz , sensitivity of 7 uV/mm, display speed of 62mm/sec with a 60Hz  notched filter applied as appropriate. EEG data were recorded continuously and digitally stored.  Video monitoring was available and reviewed as appropriate. Description: The posterior dominant rhythm consists of 8 Hz activity of moderate voltage (25-35 uV) seen predominantly in posterior head regions, symmetric and reactive to eye opening and eye closing. Sleep was characterized by vertex waves, sleep spindles (12 to 14 Hz), maximal frontocentral region. EEG initially showed continuous 3 to 6 Hz theta-delta slowing in left hemisphere, maximal left fronto-temporal region which gradually improved and was noted intermittently. Hyperventilation and photic stimulation were not performed.   ABNORMALITY - Continuous slow, left hemisphere, maximal left fronto-temporal region IMPRESSION: This study is suggestive of cortical dysfunction arising from left hemisphere, maximal left fronto-temporal region likely secondary to underlying structural abnormality. No seizures or epileptiform discharges were seen throughout the recording. Priyanka Suzanne Erps   CT ANGIO HEAD NECK W WO CM (CODE STROKE) Result Date: 03/23/2024 CLINICAL DATA:  Acute neurologic deficit. Dural venous sinus thrombosis. EXAM: CT ANGIOGRAPHY HEAD AND NECK  WITH AND WITHOUT CONTRAST TECHNIQUE: Multidetector CT imaging of the head and neck was performed using the standard protocol during bolus administration of intravenous contrast. Multiplanar CT image reconstructions and MIPs were obtained to  evaluate the vascular anatomy. Carotid stenosis measurements (when applicable) are obtained utilizing NASCET criteria, using the distal internal carotid diameter as the denominator. RADIATION DOSE REDUCTION: This exam was performed according to the departmental dose-optimization program which includes automated exposure control, adjustment of the mA and/or kV according to patient size and/or use of iterative reconstruction technique. CONTRAST:  75mL OMNIPAQUE  IOHEXOL  350 MG/ML SOLN COMPARISON:  03/23/2024, 03/20/2024, 03/08/2024 FINDINGS: CTA NECK FINDINGS Skeleton: No acute abnormality or high grade bony spinal canal stenosis. Other neck: Normal pharynx, larynx and major salivary glands. No cervical lymphadenopathy. Unremarkable thyroid gland. Upper chest: No pneumothorax or pleural effusion. No nodules or masses. Aortic arch: There is no calcific atherosclerosis of the aortic arch. Conventional 3 vessel aortic branching pattern. RIGHT carotid system: Normal without aneurysm, dissection or stenosis. LEFT carotid system: Normal without aneurysm, dissection or stenosis. Vertebral arteries: Left dominant configuration. There is no dissection, occlusion or flow-limiting stenosis to the skull base (V1-V3 segments). CTA HEAD FINDINGS POSTERIOR CIRCULATION: Right vertebral artery terminates in PICA. Normal left V4 segment. No proximal occlusion of the anterior or inferior cerebellar arteries. Basilar artery is normal. Superior cerebellar arteries are normal. Posterior cerebral arteries are normal. ANTERIOR CIRCULATION: Intracranial internal carotid arteries are normal. Anterior cerebral arteries are normal. Middle cerebral arteries are normal. Venous sinuses: There are multiple dilated  cortical veins, particularly at the left middle cranial fossa. Redemonstration of extensive dural venous sinus thrombosis with mixed density clot. There is limited flow in the transverse and sigmoid sinuses. Approximately 50% of the length of the superior sagittal sinus is occluded. 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 Straight sinus, vein of Galen and internal cerebral veins are patent. Anatomic variants: None Review of the MIP images confirms the above findings. IMPRESSION: 1. No emergent large vessel occlusion or high-grade stenosis of the intracranial arteries. 2. Redemonstration of extensive dural venous sinus thrombosis with limited flow in the transverse and sigmoid sinuses. Approximately 50% of the length of the superior sagittal sinus is occluded. 3. Persistent cavernous sinus thrombosis. Electronically Signed   By: Juanetta Nordmann M.D.   On: 03/23/2024 21:32   CT HEAD CODE STROKE WO CONTRAST Result Date: 03/23/2024 CLINICAL DATA:  Code stroke.  Acute neurologic deficit EXAM: CT HEAD WITHOUT CONTRAST TECHNIQUE: Contiguous axial images were obtained from the base of the skull through the vertex without intravenous contrast. RADIATION DOSE REDUCTION: This exam was performed according to the departmental dose-optimization program which includes automated exposure control, adjustment of the mA and/or kV according to patient size and/or use of iterative reconstruction technique. COMPARISON:  None Available. FINDINGS: Brain: There is no mass, hemorrhage or extra-axial collection. Low lying cerebellar tonsils and crowding of the basal cisterns are unchanged. The brain parenchyma is normal, without evidence of acute or chronic infarction. Vascular: Hyperdensity within the superior sagittal sinus is consistent with known areas of dural venous sinus thrombosis. Skull: The visualized skull base, calvarium and extracranial soft tissues are normal. Sinuses/Orbits: No fluid levels or advanced mucosal  thickening of the visualized paranasal sinuses. No mastoid or middle ear effusion. The orbits are normal. ASPECTS [facility] Stroke Program Early CT Score) - Ganglionic level infarction (caudate, lentiform nuclei, internal capsule, insula, M1-M3 cortex): 7 - Supraganglionic infarction (M4-M6 cortex): 3 Total score (0-10 with 10 being normal): 10 IMPRESSION: 1. No acute intracranial hemorrhage. 2. ASPECTS is 10. 3. Hyperdensity within the superior sagittal sinus is consistent with known areas of dural venous sinus thrombosis. 4. Low lying cerebellar tonsils, unchanged. These results were  communicated to Dr. Ann Keto at 9:08 pm on 03/23/2024 by text page via the [facility] messaging system. Electronically Signed   By: Juanetta Nordmann M.D.   On: 03/23/2024 21:12   CT HEAD WO CONTRAST ( ) Result Date: 03/20/2024 CLINICAL DATA:  Dural venous sinus thrombosis suspected has dural sinus thrombosis but HA's have spiked- up to 10/10- so cocnerned there s achange in thrombosis EXAM: CT HEAD WITHOUT CONTRAST TECHNIQUE: Contiguous axial images were obtained from the base of the skull through the vertex without intravenous contrast. RADIATION DOSE REDUCTION: This exam was performed according to the departmental dose-optimization program which includes automated exposure control, adjustment of the mA and/or kV according to patient size and/or use of iterative reconstruction technique. COMPARISON:  None Available. FINDINGS: Brain: Known areas of subacute infarct were better characterized on prior MRI. No evidence of new/interval acute large vascular territory infarct or acute hemorrhage. No evidence of hydrocephalus or mass lesion. Similar low lying tonsils. Partially empty sella. Vascular: Redemonstrated areas of hyperdensity within the dural venous sinuses, compatible with known dural venous sinus thrombosis. Skull: No acute fracture. Sinuses/Orbits: Clear sinuses.  No acute orbital findings. IMPRESSION: 1. No significant  change in comparison to May 5 CT head. Known areas of subacute infarct were better characterized on MRI. No evidence of new/interval acute large vascular territory infarct or acute hemorrhage. 2. Redemonstrated areas of hyperdensity within the dural venous sinuses, compatible with known dural venous sinus thrombosis that was better characterized on prior CTV and MRI. 3. Similar low lying cerebellar tonsils. Electronically Signed   By: Stevenson Elbe M.D.   On: 03/20/2024 11:38   CT ABDOMEN PELVIS W WO CONTRAST Result Date: 03/20/2024 CLINICAL DATA:  Constant abdominal pain. Previous dural venous thrombosis. EXAM: CT ABDOMEN AND PELVIS WITHOUT AND WITH CONTRAST TECHNIQUE: Multidetector CT imaging of the abdomen and pelvis was performed following the standard protocol before and following the bolus administration of intravenous contrast. RADIATION DOSE REDUCTION: This exam was performed according to the departmental dose-optimization program which includes automated exposure control, adjustment of the mA and/or kV according to patient size and/or use of iterative reconstruction technique. CONTRAST:  75mL OMNIPAQUE  IOHEXOL  350 MG/ML SOLN COMPARISON:  X-ray 03/18/2024. FINDINGS: Lower chest: Mild ground-glass along the dependent lungs with some atelectasis. Trace pleural fluid. Hepatobiliary: Small area of focal fat deposition seen liver adjacent to the falciform ligament. Segment 4. No other space-occupying liver lesion identified at this time. Gallbladder is nondilated. Patent portal vein. Pancreas: Unremarkable. No pancreatic ductal dilatation or surrounding inflammatory changes. Spleen: Normal in size without focal abnormality. Adrenals/Urinary Tract: Adrenal glands are unremarkable. Kidneys are normal, without renal calculi, focal lesion, or hydronephrosis. Bladder has a preserved contour. There is a small area of slight bladder wall thickening anterior in the midline uncertain etiology and significance.  Stomach/Bowel: Large bowel has a normal course and caliber. Scattered air-fluid levels identified along a: Particularly the transverse colon and some in the descending colon. Normal appendix in the right lower quadrant posteroinferior to the cecum. Mild debris and fluid in the stomach. Small bowel is nondilated. Vascular/Lymphatic: No significant vascular findings are present. No enlarged abdominal or pelvic lymph nodes. Reproductive: Prostate is unremarkable. Other: No free air or free fluid identified. Motion throughout the examination limiting evaluation. Musculoskeletal: Scattered Schmorl's nodes. Mild endplate osteophytes in the lumbar spine. IMPRESSION: No bowel obstruction, free air or free fluid. There are a few loops of colon with some air-fluid levels, nonspecific. Normal appendix. Small area of mild bladder wall  thickening along the anterior aspect of bladder in the midline of uncertain etiology and significance. Correlate with symptoms. Motion Electronically Signed   By: Adrianna Horde M.D.   On: 03/20/2024 10:15   DG Abd 1 View Result Date: 03/18/2024 CLINICAL DATA:  Nausea EXAM: ABDOMEN - 1 VIEW COMPARISON:  None Available. FINDINGS: Scattered large and small bowel gas is noted. No free air is seen. No obstructive changes are noted. No bony abnormality is seen. IMPRESSION: No acute abnormality noted. Electronically Signed   By: [name]   On: 03/18/2024 21:14   CT HEAD POST STROKE FOLLOWUP/TIMED/STAT READ Result Date: 03/15/2024 CLINICAL DATA:  Code stroke. Neuro deficit, acute, stroke suspected. EXAM: CT HEAD WITHOUT CONTRAST TECHNIQUE: Contiguous axial images were obtained from the base of the skull through the vertex without intravenous contrast. RADIATION DOSE REDUCTION: This exam was performed according to the departmental dose-optimization program which includes automated exposure control, adjustment of the mA and/or kV according to patient size and/or use of iterative reconstruction  technique. COMPARISON:  Prior head CT examinations 03/09/2024 and earlier. CT venogram head 03/08/2024. Prior brain MRI examinations 03/08/2024 and earlier. FINDINGS: Brain: Known subacute foci of venous parenchymal ischemia within the medial frontal lobes (left greater than right), not appreciably changed in extent from the prior brain MRI of 03/08/2024. A small focus of subacute venous parenchymal ischemia within the left occipital lobe was better appreciated on the prior MRI of 03/08/2024. No evidence of frank hemorrhagic conversion. Known cerebellar tonsillar ectopia, incompletely imaged and incompletely assessed. No interval acute infarct is identified. No extra-axial fluid collection. No evidence of an intracranial mass. No midline shift. Vascular: No hyperdense arterial vessel. Some areas of persistent hyperdensity in the venous sinuses consistent with known venous thrombotic disease. Skull: No calvarial fracture or aggressive osseous lesion. Sinuses/Orbits: No mass or acute finding within the imaged orbits. Mild mucosal thickening scattered within the paranasal sinuses at the imaged levels. IMPRESSION: 1. No significant change as compared to the non-contrast head CT of 03/09/2024. 2. Known foci of subacute venous parenchymal ischemia within the medial frontal lobes and left occipital lobe, some of which were better appreciated on the prior MRI of 03/08/2024. No evidence of frank hemorrhagic conversion. 3. Some areas of persistent hyperdensity in the venous sinuses consistent with known venous thrombotic disease. 4. Known cerebellar tonsillar ectopia, incompletely imaged and incompletely assessed. 5. Mild paranasal sinus mucosal thickening at the imaged levels. Electronically Signed   By: Bascom Lily D.O.   On: 03/15/2024 16:33   IR Veno Sagittal Sinus Result Date: 03/12/2024 CLINICAL DATA:  Decreased level of consciousness. Somnolent. Progression of dural venous thrombosis on CT venogram of the head.  EXAM: BILATERAL COMMON CAROTID AND INNOMINATE ANGIOGRAPHY COMPARISON:  CT venogram and CT of the brain of 03/09/2024. MEDICATIONS: Heparin  2000 units IV; no antibiotic was administered within 1 hour of the procedure. ANESTHESIA/SEDATION: General anesthesia. CONTRAST:  Omnipaque  300 approximately 85 mL. FLUOROSCOPY TIME:  Fluoroscopy Time: 96 minutes 18 seconds (1478 mGy). COMPLICATIONS: None immediate. TECHNIQUE: Informed written consent was obtained from the patient after a thorough discussion of the procedural risks, benefits and alternatives. All questions were addressed. Maximal Sterile Barrier Technique was utilized including caps, mask, sterile gowns, sterile gloves, sterile drape, hand hygiene and skin antiseptic. A timeout was performed prior to the initiation of the procedure. The right groin was prepped and draped in the usual sterile fashion. Thereafter using modified Seldinger technique, transfemoral access into the left common femoral artery was obtained  without difficulty. Over an 0.035 inch guidewire, a 5 French Pinnacle sheath was inserted. Through this, and also over an 0.035 inch guidewire, a 5 [place] JB 1 catheter was advanced to the aortic arch region and selectively positioned in the right common carotid artery and the left common carotid artery. Using a similar technique, access into the right common femoral vein was obtained over an 0.035 inch guidewire and an 8 French short Pinnacle sheath. Over the 035 inch guidewire, a 5 [place] JB 1 catheter was then advanced through the inferior vena cava via the right atrium, and into the right internal jugular vein initially, and then into the left internal jugular vein. On the right side, a 088 80 cm Neuron Max sheath was advanced and positioned in the proximal right internal jugular vein. The guidewire was removed. Good aspiration obtained from the hub of the 8 [place] Neuron Max sheath which was then connected to continuous heparinized saline infusion.  FINDINGS: The right common carotid arteriogram demonstrates the right external carotid artery and its major branches to be widely patent. The right internal carotid artery at the bulb to the cranial skull base is widely patent. The petrous, the cavernous and the supraclinoid right ICA are widely patent. Right posterior communicating artery is present. The right middle and the right anterior cerebral artery opacify into the capillary and venous phases. Cross filling with the anterior communicating artery of the left anterior cerebral artery A2 segment and distally is evident. Venous phase again demonstrates opacification of the proximal 2/3 of the superior sagittal sinus. The posterior half of the superior sagittal sinus demonstrates no evidence of opacification. Also no opacification is seen of the transverse sinuses or of the sigmoid sinuses bilaterally. Delayed hemodynamic flow was present in the tertiary branches of the anterior and the middle cerebral artery distribution with a delayed opacification of the anterior half of the superior sagittal sinus. The left common carotid arteriogram demonstrates the left external carotid artery and its major branches to be widely patent. The left internal carotid artery at the bulb to the cranial skull base is widely patent. The petrous, the cavernous and the supraclinoid left ICA are widely patent. Left middle cerebral artery and the left anterior cerebral artery opacify into the capillary and venous phases. Slow hemodynamic flow was seen into the venous phase and the capillary phase. Retrograde opacification is seen within the cortical veins of the cerebral convexity. Retrograde opacification is seen of the inferior sagittal sinus, the internal cerebral veins, and the straight sinus into the torcula. Attempted endovascular revascularization of the occluded internal jugular veins, the sigmoid sinuses bilaterally, and of the transverse sinuses bilaterally. Through the 088  Neuron Max sheath in the proximal right internal jugular vein, a combination of a 105 cm 071 Benchmark guide catheter with an 021 150 cm microcatheter was advanced over an 018 inch Aristotle micro guidewire with a moderate J configuration to the proximal right internal jugular vein. Attempts were then made to access the micro guidewire with the microcatheter, and of the intermediate catheter through the occluded jugular bulb and the proximal right sigmoid sinus without success. The 088 Neuron Max sheath was then advanced as described above and positioned in the mid to the proximal left internal jugular vein. Different combinations using the 105 Benchmark intermediate catheters and 4 French glide sheaths with different 035 inch guidewire, and 018 inch micro guidewires and 021 microcatheter were used to access the occluded proximal internal jugular vein and the sigmoid sinus and transverse  sinuses without success. After multiple unsuccessful attempts, the procedure was stopped. A final control arteriogram performed through the 5 French catheter in the left common carotid artery demonstrated no evidence of intraluminal filling defects, or of occlusions on the arterial side. The egress of the venous blood flow remained unchanged compared to the initial left-sided common carotid arteriogram. An 8 French ExoSeal closure device was deployed for hemostasis at the left groin puncture site. Manual compression was held at the right common femoral venous puncture site for hemostasis. The patient's general anesthesia was reversed and the patient was extubated. Upon recovery, the patient was able to open and close his eyes. Has pupils were 2-3 mm equal bilaterally and sluggish reactive. Flat panel CT of the brain demonstrated venous congestion in both cerebral hemispheres. No evidence of intraparenchymal hemorrhage was evident. Patient was then returned to the floor in stable condition. IMPRESSION: Status post unsuccessful  revascularization of occluded internal jugular veins proximally, and of the sigmoid and transverse sinuses. No change in the occluded posterior half of the superior sagittal sinus. Prominent venous outflow through the inferior sagittal sinus internal cerebral veins, and the straight sinus to the torcula. PLAN: As per referring MD. Electronically Signed   By: Luellen Sages M.D.   On: 03/12/2024 07:55   IR ANGIO INTRA EXTRACRAN SEL COM CAROTID INNOMINATE BILAT MOD SED Result Date: 03/12/2024 CLINICAL DATA:  Decreased level of consciousness. Somnolent. Progression of dural venous thrombosis on CT venogram of the head. EXAM: BILATERAL COMMON CAROTID AND INNOMINATE ANGIOGRAPHY COMPARISON:  CT venogram and CT of the brain of 03/09/2024. MEDICATIONS: Heparin  2000 units IV; no antibiotic was administered within 1 hour of the procedure. ANESTHESIA/SEDATION: General anesthesia. CONTRAST:  Omnipaque  300 approximately 85 mL. FLUOROSCOPY TIME:  Fluoroscopy Time: 96 minutes 18 seconds (1478 mGy). COMPLICATIONS: None immediate. TECHNIQUE: Informed written consent was obtained from the patient after a thorough discussion of the procedural risks, benefits and alternatives. All questions were addressed. Maximal Sterile Barrier Technique was utilized including caps, mask, sterile gowns, sterile gloves, sterile drape, hand hygiene and skin antiseptic. A timeout was performed prior to the initiation of the procedure. The right groin was prepped and draped in the usual sterile fashion. Thereafter using modified Seldinger technique, transfemoral access into the left common femoral artery was obtained without difficulty. Over an 0.035 inch guidewire, a 5 French Pinnacle sheath was inserted. Through this, and also over an 0.035 inch guidewire, a 5 [place] JB 1 catheter was advanced to the aortic arch region and selectively positioned in the right common carotid artery and the left common carotid artery. Using a similar technique,  access into the right common femoral vein was obtained over an 0.035 inch guidewire and an 8 French short Pinnacle sheath. Over the 035 inch guidewire, a 5 [place] JB 1 catheter was then advanced through the inferior vena cava via the right atrium, and into the right internal jugular vein initially, and then into the left internal jugular vein. On the right side, a 088 80 cm Neuron Max sheath was advanced and positioned in the proximal right internal jugular vein. The guidewire was removed. Good aspiration obtained from the hub of the 8 [place] Neuron Max sheath which was then connected to continuous heparinized saline infusion. FINDINGS: The right common carotid arteriogram demonstrates the right external carotid artery and its major branches to be widely patent. The right internal carotid artery at the bulb to the cranial skull base is widely patent. The petrous, the cavernous and the supraclinoid  right ICA are widely patent. Right posterior communicating artery is present. The right middle and the right anterior cerebral artery opacify into the capillary and venous phases. Cross filling with the anterior communicating artery of the left anterior cerebral artery A2 segment and distally is evident. Venous phase again demonstrates opacification of the proximal 2/3 of the superior sagittal sinus. The posterior half of the superior sagittal sinus demonstrates no evidence of opacification. Also no opacification is seen of the transverse sinuses or of the sigmoid sinuses bilaterally. Delayed hemodynamic flow was present in the tertiary branches of the anterior and the middle cerebral artery distribution with a delayed opacification of the anterior half of the superior sagittal sinus. The left common carotid arteriogram demonstrates the left external carotid artery and its major branches to be widely patent. The left internal carotid artery at the bulb to the cranial skull base is widely patent. The petrous, the cavernous  and the supraclinoid left ICA are widely patent. Left middle cerebral artery and the left anterior cerebral artery opacify into the capillary and venous phases. Slow hemodynamic flow was seen into the venous phase and the capillary phase. Retrograde opacification is seen within the cortical veins of the cerebral convexity. Retrograde opacification is seen of the inferior sagittal sinus, the internal cerebral veins, and the straight sinus into the torcula. Attempted endovascular revascularization of the occluded internal jugular veins, the sigmoid sinuses bilaterally, and of the transverse sinuses bilaterally. Through the 088 Neuron Max sheath in the proximal right internal jugular vein, a combination of a 105 cm 071 Benchmark guide catheter with an 021 150 cm microcatheter was advanced over an 018 inch Aristotle micro guidewire with a moderate J configuration to the proximal right internal jugular vein. Attempts were then made to access the micro guidewire with the microcatheter, and of the intermediate catheter through the occluded jugular bulb and the proximal right sigmoid sinus without success. The 088 Neuron Max sheath was then advanced as described above and positioned in the mid to the proximal left internal jugular vein. Different combinations using the 105 Benchmark intermediate catheters and 4 French glide sheaths with different 035 inch guidewire, and 018 inch micro guidewires and 021 microcatheter were used to access the occluded proximal internal jugular vein and the sigmoid sinus and transverse sinuses without success. After multiple unsuccessful attempts, the procedure was stopped. A final control arteriogram performed through the 5 French catheter in the left common carotid artery demonstrated no evidence of intraluminal filling defects, or of occlusions on the arterial side. The egress of the venous blood flow remained unchanged compared to the initial left-sided common carotid arteriogram. An 8  French ExoSeal closure device was deployed for hemostasis at the left groin puncture site. Manual compression was held at the right common femoral venous puncture site for hemostasis. The patient's general anesthesia was reversed and the patient was extubated. Upon recovery, the patient was able to open and close his eyes. Has pupils were 2-3 mm equal bilaterally and sluggish reactive. Flat panel CT of the brain demonstrated venous congestion in both cerebral hemispheres. No evidence of intraparenchymal hemorrhage was evident. Patient was then returned to the floor in stable condition. IMPRESSION: Status post unsuccessful revascularization of occluded internal jugular veins proximally, and of the sigmoid and transverse sinuses. No change in the occluded posterior half of the superior sagittal sinus. Prominent venous outflow through the inferior sagittal sinus internal cerebral veins, and the straight sinus to the torcula. PLAN: As per referring MD. Electronically Signed  By: Sanjeev  Deveshwar M.D.   On: 03/12/2024 07:55   IR THROMBECT VENO [facility] MOD SED Result Date: 03/12/2024 CLINICAL DATA:  Decreased level of consciousness. Somnolent. Progression of dural venous thrombosis on CT venogram of the head. EXAM: BILATERAL COMMON CAROTID AND INNOMINATE ANGIOGRAPHY COMPARISON:  CT venogram and CT of the brain of 03/09/2024. MEDICATIONS: Heparin  2000 units IV; no antibiotic was administered within 1 hour of the procedure. ANESTHESIA/SEDATION: General anesthesia. CONTRAST:  Omnipaque  300 approximately 85 mL. FLUOROSCOPY TIME:  Fluoroscopy Time: 96 minutes 18 seconds (1478 mGy). COMPLICATIONS: None immediate. TECHNIQUE: Informed written consent was obtained from the patient after a thorough discussion of the procedural risks, benefits and alternatives. All questions were addressed. Maximal Sterile Barrier Technique was utilized including caps, mask, sterile gowns, sterile gloves, sterile drape, hand hygiene and skin  antiseptic. A timeout was performed prior to the initiation of the procedure. The right groin was prepped and draped in the usual sterile fashion. Thereafter using modified Seldinger technique, transfemoral access into the left common femoral artery was obtained without difficulty. Over an 0.035 inch guidewire, a 5 French Pinnacle sheath was inserted. Through this, and also over an 0.035 inch guidewire, a 5 [place] JB 1 catheter was advanced to the aortic arch region and selectively positioned in the right common carotid artery and the left common carotid artery. Using a similar technique, access into the right common femoral vein was obtained over an 0.035 inch guidewire and an 8 French short Pinnacle sheath. Over the 035 inch guidewire, a 5 [place] JB 1 catheter was then advanced through the inferior vena cava via the right atrium, and into the right internal jugular vein initially, and then into the left internal jugular vein. On the right side, a 088 80 cm Neuron Max sheath was advanced and positioned in the proximal right internal jugular vein. The guidewire was removed. Good aspiration obtained from the hub of the 8 [place] Neuron Max sheath which was then connected to continuous heparinized saline infusion. FINDINGS: The right common carotid arteriogram demonstrates the right external carotid artery and its major branches to be widely patent. The right internal carotid artery at the bulb to the cranial skull base is widely patent. The petrous, the cavernous and the supraclinoid right ICA are widely patent. Right posterior communicating artery is present. The right middle and the right anterior cerebral artery opacify into the capillary and venous phases. Cross filling with the anterior communicating artery of the left anterior cerebral artery A2 segment and distally is evident. Venous phase again demonstrates opacification of the proximal 2/3 of the superior sagittal sinus. The posterior half of the superior  sagittal sinus demonstrates no evidence of opacification. Also no opacification is seen of the transverse sinuses or of the sigmoid sinuses bilaterally. Delayed hemodynamic flow was present in the tertiary branches of the anterior and the middle cerebral artery distribution with a delayed opacification of the anterior half of the superior sagittal sinus. The left common carotid arteriogram demonstrates the left external carotid artery and its major branches to be widely patent. The left internal carotid artery at the bulb to the cranial skull base is widely patent. The petrous, the cavernous and the supraclinoid left ICA are widely patent. Left middle cerebral artery and the left anterior cerebral artery opacify into the capillary and venous phases. Slow hemodynamic flow was seen into the venous phase and the capillary phase. Retrograde opacification is seen within the cortical veins of the cerebral convexity. Retrograde opacification is seen of  the inferior sagittal sinus, the internal cerebral veins, and the straight sinus into the torcula. Attempted endovascular revascularization of the occluded internal jugular veins, the sigmoid sinuses bilaterally, and of the transverse sinuses bilaterally. Through the 088 Neuron Max sheath in the proximal right internal jugular vein, a combination of a 105 cm 071 Benchmark guide catheter with an 021 150 cm microcatheter was advanced over an 018 inch Aristotle micro guidewire with a moderate J configuration to the proximal right internal jugular vein. Attempts were then made to access the micro guidewire with the microcatheter, and of the intermediate catheter through the occluded jugular bulb and the proximal right sigmoid sinus without success. The 088 Neuron Max sheath was then advanced as described above and positioned in the mid to the proximal left internal jugular vein. Different combinations using the 105 Benchmark intermediate catheters and 4 French glide sheaths with  different 035 inch guidewire, and 018 inch micro guidewires and 021 microcatheter were used to access the occluded proximal internal jugular vein and the sigmoid sinus and transverse sinuses without success. After multiple unsuccessful attempts, the procedure was stopped. A final control arteriogram performed through the 5 French catheter in the left common carotid artery demonstrated no evidence of intraluminal filling defects, or of occlusions on the arterial side. The egress of the venous blood flow remained unchanged compared to the initial left-sided common carotid arteriogram. An 8 French ExoSeal closure device was deployed for hemostasis at the left groin puncture site. Manual compression was held at the right common femoral venous puncture site for hemostasis. The patient's general anesthesia was reversed and the patient was extubated. Upon recovery, the patient was able to open and close his eyes. Has pupils were 2-3 mm equal bilaterally and sluggish reactive. Flat panel CT of the brain demonstrated venous congestion in both cerebral hemispheres. No evidence of intraparenchymal hemorrhage was evident. Patient was then returned to the floor in stable condition. IMPRESSION: Status post unsuccessful revascularization of occluded internal jugular veins proximally, and of the sigmoid and transverse sinuses. No change in the occluded posterior half of the superior sagittal sinus. Prominent venous outflow through the inferior sagittal sinus internal cerebral veins, and the straight sinus to the torcula. PLAN: As per referring MD. Electronically Signed   By: Luellen Sages M.D.   On: 03/12/2024 07:55   IR CT Head Ltd Result Date: 03/12/2024 CLINICAL DATA:  Decreased level of consciousness. Somnolent. Progression of dural venous thrombosis on CT venogram of the head. EXAM: BILATERAL COMMON CAROTID AND INNOMINATE ANGIOGRAPHY COMPARISON:  CT venogram and CT of the brain of 03/09/2024. MEDICATIONS: Heparin  2000  units IV; no antibiotic was administered within 1 hour of the procedure. ANESTHESIA/SEDATION: General anesthesia. CONTRAST:  Omnipaque  300 approximately 85 mL. FLUOROSCOPY TIME:  Fluoroscopy Time: 96 minutes 18 seconds (1478 mGy). COMPLICATIONS: None immediate. TECHNIQUE: Informed written consent was obtained from the patient after a thorough discussion of the procedural risks, benefits and alternatives. All questions were addressed. Maximal Sterile Barrier Technique was utilized including caps, mask, sterile gowns, sterile gloves, sterile drape, hand hygiene and skin antiseptic. A timeout was performed prior to the initiation of the procedure. The right groin was prepped and draped in the usual sterile fashion. Thereafter using modified Seldinger technique, transfemoral access into the left common femoral artery was obtained without difficulty. Over an 0.035 inch guidewire, a 5 French Pinnacle sheath was inserted. Through this, and also over an 0.035 inch guidewire, a 5 [place] JB 1 catheter was advanced to the aortic  arch region and selectively positioned in the right common carotid artery and the left common carotid artery. Using a similar technique, access into the right common femoral vein was obtained over an 0.035 inch guidewire and an 8 French short Pinnacle sheath. Over the 035 inch guidewire, a 5 [place] JB 1 catheter was then advanced through the inferior vena cava via the right atrium, and into the right internal jugular vein initially, and then into the left internal jugular vein. On the right side, a 088 80 cm Neuron Max sheath was advanced and positioned in the proximal right internal jugular vein. The guidewire was removed. Good aspiration obtained from the hub of the 8 [place] Neuron Max sheath which was then connected to continuous heparinized saline infusion. FINDINGS: The right common carotid arteriogram demonstrates the right external carotid artery and its major branches to be widely patent. The  right internal carotid artery at the bulb to the cranial skull base is widely patent. The petrous, the cavernous and the supraclinoid right ICA are widely patent. Right posterior communicating artery is present. The right middle and the right anterior cerebral artery opacify into the capillary and venous phases. Cross filling with the anterior communicating artery of the left anterior cerebral artery A2 segment and distally is evident. Venous phase again demonstrates opacification of the proximal 2/3 of the superior sagittal sinus. The posterior half of the superior sagittal sinus demonstrates no evidence of opacification. Also no opacification is seen of the transverse sinuses or of the sigmoid sinuses bilaterally. Delayed hemodynamic flow was present in the tertiary branches of the anterior and the middle cerebral artery distribution with a delayed opacification of the anterior half of the superior sagittal sinus. The left common carotid arteriogram demonstrates the left external carotid artery and its major branches to be widely patent. The left internal carotid artery at the bulb to the cranial skull base is widely patent. The petrous, the cavernous and the supraclinoid left ICA are widely patent. Left middle cerebral artery and the left anterior cerebral artery opacify into the capillary and venous phases. Slow hemodynamic flow was seen into the venous phase and the capillary phase. Retrograde opacification is seen within the cortical veins of the cerebral convexity. Retrograde opacification is seen of the inferior sagittal sinus, the internal cerebral veins, and the straight sinus into the torcula. Attempted endovascular revascularization of the occluded internal jugular veins, the sigmoid sinuses bilaterally, and of the transverse sinuses bilaterally. Through the 088 Neuron Max sheath in the proximal right internal jugular vein, a combination of a 105 cm 071 Benchmark guide catheter with an 021 150 cm  microcatheter was advanced over an 018 inch Aristotle micro guidewire with a moderate J configuration to the proximal right internal jugular vein. Attempts were then made to access the micro guidewire with the microcatheter, and of the intermediate catheter through the occluded jugular bulb and the proximal right sigmoid sinus without success. The 088 Neuron Max sheath was then advanced as described above and positioned in the mid to the proximal left internal jugular vein. Different combinations using the 105 Benchmark intermediate catheters and 4 French glide sheaths with different 035 inch guidewire, and 018 inch micro guidewires and 021 microcatheter were used to access the occluded proximal internal jugular vein and the sigmoid sinus and transverse sinuses without success. After multiple unsuccessful attempts, the procedure was stopped. A final control arteriogram performed through the 5 French catheter in the left common carotid artery demonstrated no evidence of intraluminal filling defects,  or of occlusions on the arterial side. The egress of the venous blood flow remained unchanged compared to the initial left-sided common carotid arteriogram. An 8 French ExoSeal closure device was deployed for hemostasis at the left groin puncture site. Manual compression was held at the right common femoral venous puncture site for hemostasis. The patient's general anesthesia was reversed and the patient was extubated. Upon recovery, the patient was able to open and close his eyes. Has pupils were 2-3 mm equal bilaterally and sluggish reactive. Flat panel CT of the brain demonstrated venous congestion in both cerebral hemispheres. No evidence of intraparenchymal hemorrhage was evident. Patient was then returned to the floor in stable condition. IMPRESSION: Status post unsuccessful revascularization of occluded internal jugular veins proximally, and of the sigmoid and transverse sinuses. No change in the occluded posterior  half of the superior sagittal sinus. Prominent venous outflow through the inferior sagittal sinus internal cerebral veins, and the straight sinus to the torcula. PLAN: As per referring MD. Electronically Signed   By: Luellen Sages M.D.   On: 03/12/2024 07:55   US  EKG SITE RITE Result Date: 03/10/2024 If Site Rite image not attached, placement could not be confirmed due to current cardiac rhythm.  Overnight EEG with video Result Date: 03/10/2024 Arleene Lack, MD     03/11/2024  9:09 AM Patient Name: Brand Siever MRN: [id number] Epilepsy Attending: Arleene Lack Referring Physician/Provider: Consuelo Denmark, MD Duration: 03/09/2024 2212 to 03/10/2024 2212  Patient history: 33yo M with new onset R sided weakness. EEG to evaluate for seizure  Level of alertness: Awake, asleep  AEDs during EEG study: TPM  Technical aspects: This EEG study was done with scalp electrodes positioned according to the 10-20 International system of electrode placement. Electrical activity was reviewed with band pass filter of 1-70Hz , sensitivity of 7 uV/mm, display speed of 34mm/sec with a 60Hz  notched filter applied as appropriate. EEG data were recorded continuously and digitally stored.  Video monitoring was available and reviewed as appropriate.  Description: The posterior dominant rhythm consists of 8 Hz activity of moderate voltage (25-35 uV) seen predominantly in posterior head regions, symmetric and reactive to eye opening and eye closing. EEG showed continuous generalized and lateralized left hemisphere 3 to 6 Hz theta-delta slowing. Sleep was characterized by vertex waves, sleep spindles (12-14hz ), maximal fronto-central region. Hyperventilation and photic stimulation were not performed.    ABNORMALITY - Continuous slow, generalized  and lateralized left hemisphere  IMPRESSION: This study is suggestive of cortical dysfunction in left hemisphere likely secondary to underlying structural abnormality. Additionally there is  moderate diffuse encephalopathy. No seizures or epileptiform discharges were seen throughout the recording.  Arleene Lack    DG CHEST PORT 1 VIEW Result Date: 03/09/2024 EXAM: 1 VIEW(S) XRAY OF THE CHEST 03/09/2024 10:49:20 AM COMPARISON: None available. CLINICAL HISTORY: [id number] Weakness [id number]. Stroke, weakness; rover Stroke, weakness; rover FINDINGS: LUNGS AND PLEURA: No consolidation. No pulmonary edema. No pleural effusion. No pneumothorax. HEART AND MEDIASTINUM: No acute abnormality of the cardiac and mediastinal silhouettes. BONES AND SOFT TISSUES: No acute osseous abnormality. IMPRESSION: 1. No acute process. Electronically signed by: Karlyn Overman MD 03/09/2024 02:47 PM EDT RP Workstation: [id number]   CT HEAD WO CONTRAST ( ) Result Date: 03/09/2024 CLINICAL DATA:  Stroke.  Follow-up.  Dural venous sinus thrombosis. EXAM: CT HEAD WITHOUT CONTRAST TECHNIQUE: Contiguous axial images were obtained from the base of the skull through the vertex without intravenous contrast. RADIATION DOSE REDUCTION: This exam was performed according to  the departmental dose-optimization program which includes automated exposure control, adjustment of the mA and/or kV according to patient size and/or use of iterative reconstruction technique. COMPARISON:  CT and MRI studies done yesterday. FINDINGS: Brain: Generalized brain swelling. Focal low density at the frontoparietal vertices, left more than right could relate to venous infarctions. Focal low-density in the left occipital lobe could be subtle evidence of some early venous ischemic changes in that region. No intraparenchymal hemorrhage. No hydrocephalus or extra-axial collection. Vascular: Some areas of persistent hyperdensity in the venous sinuses consistent with the known venous thrombotic disease. Skull: Negative Sinuses/Orbits: Clear/normal Other: None IMPRESSION: 1. Generalized brain swelling. Focal low density at the frontoparietal vertices, left more than  right, could relate to venous infarctions. Focal low-density in the left occipital lobe could be subtle evidence of some early venous ischemic changes in that region. No intraparenchymal hemorrhage. 2. Some areas of persistent hyperdensity in the venous sinuses consistent with the known venous thrombotic disease. Electronically Signed   By: Bettylou Brunner M.D.   On: 03/09/2024 14:10   EEG adult Result Date: 03/09/2024 Arleene Lack, MD     03/09/2024  8:35 AM Patient Name: Edrian Melucci MRN: [id number] Epilepsy Attending: Arleene Lack Referring Physician/Provider: Laymond Priestly, NP Date: 03/08/2024 Duration: 22.15 mins Patient history: 33yo M with new onset R sided weakness. EEG to evaluate for seizure Level of alertness: Awake AEDs during EEG study: TPM Technical aspects: This EEG study was done with scalp electrodes positioned according to the 10-20 International system of electrode placement. Electrical activity was reviewed with band pass filter of 1-70Hz , sensitivity of 7 uV/mm, display speed of 89mm/sec with a 60Hz  notched filter applied as appropriate. EEG data were recorded continuously and digitally stored.  Video monitoring was available and reviewed as appropriate. Description: The posterior dominant rhythm consists of 8 Hz activity of moderate voltage (25-35 uV) seen predominantly in posterior head regions, asymmetric ( left<right) and reactive to eye opening and eye closing. EEG showed continuous polymorphic 3 to 6 Hz theta-delta slowing in left hemisphere as well as intermittent generalized 3-5hz  theta-delta slowing.Hyperventilation and photic stimulation were not performed.   ABNORMALITY - Continuous slow, left hemisphere - Intermittent slow, generalized - Background asymmetry, left<right IMPRESSION: This study is suggestive of cortical dysfunction arising from left hemisphere likely secondary to underlying structural abnormality. Additionally there is mild to moderate diffuse encephalopathy.  No seizures or epileptiform discharges were seen throughout the recording. Arleene Lack   MR BRAIN W WO CONTRAST Result Date: 03/09/2024 CLINICAL DATA:  Follow-up examination for dural venous sinus thrombosis. EXAM: MRI HEAD WITHOUT AND WITH CONTRAST TECHNIQUE: Multiplanar, multiecho pulse sequences of the brain and surrounding structures were obtained without and with intravenous contrast. CONTRAST:  7mL GADAVIST  GADOBUTROL  1 MMOL/ML IV SOLN COMPARISON:  Comparison made with CT venogram performed on the same day as well as multiple previous studies. FINDINGS: Brain: Cerebral volume within normal limits for age. Residual mild diffusion signal abnormality involving the parasagittal perirolandic regions again seen (series 5, images [id number]). Overall, appearance and distribution as compared to previous MRI from 03/02/2024 is not significantly changed or progressed. Mild associated T2/FLAIR signal intensity without ADC correlate. 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 Mild associated petechial blood hemorrhage at the posterior left frontal region (series 12, image 51), stable from prior. No frank hemorrhagic transformation or significant mass effect. Additional subtle diffusion signal abnormality involving the left occipital region (series 5, image 73), also likely evolving subtle venous ischemic changes,  not significantly changed in retrospect. No other new areas of acute or evolving ischemia. No other acute intracranial hemorrhage. 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 Preponderance of clot present within the posterior aspect of the superior sagittal sinus, with additional clot in the left transverse and sigmoid sinuses. Subocclusive clot within the right transverse sinus. Probable cavernous sinus thromboses better seen on prior exam. No mass lesion or midline shift. Stable ventricular size and  morphology. No extra-axial fluid collection. Pituitary gland within normal limits. No other pathologic enhancement. Vascular: Persistent dural venous sinus thrombosis as above. Major intracranial arterial vascular flow voids are maintained. Skull and upper cervical spine: Cerebellar tonsils are low lying extending up to 9 mm below the foramen magnum, similar to prior. Bone marrow signal intensity within normal limits. No scalp soft tissue abnormality. Sinuses/Orbits: Subtle bulging of the optic discs at the posterior aspects of the globes, suggesting elevated intracranial pressures. Globes orbital soft tissues otherwise unremarkable. Mild mucosal thickening about the sphenoid ethmoidal sinuses. Paranasal sinuses are otherwise clear. Trace bilateral mastoid effusions, of doubtful significance. Other: None. IMPRESSION: 1. 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 2. Evolving subacute venous ischemic changes involving the paramedian frontal lobes and likely the left occipital lobe, not significantly changed as compared to previous. Mild associated petechial hemorrhage at the posterior left frontal region, stable from prior. No frank hemorrhagic transformation or significant mass effect. No new or progressive areas of ischemia. 3. Low-lying cerebellar tonsils extending up to 9 mm below the foramen magnum, similar to prior. Electronically Signed   By: Virgia Griffins M.D.   On: 03/09/2024 03:15   CT VENOGRAM HEAD Result Date: 03/08/2024 CLINICAL DATA:  Dural venous sinus thrombosis. Right-sided weakness. EXAM: CT VENOGRAM HEAD TECHNIQUE: Venographic phase images of the brain were obtained following the administration of intravenous contrast. Multiplanar reformats and maximum intensity projections were generated. RADIATION DOSE REDUCTION: This exam was performed according to the departmental dose-optimization program which includes  automated exposure control, adjustment of the mA and/or kV according to patient size and/or use of iterative reconstruction technique. CONTRAST:  OMNIPAQUE  IOHEXOL  350 MG/ML SOLN COMPARISON:  CT venogram 03/02/2024 FINDINGS: Extensive mixed occlusive and subocclusive thrombus in the posterior aspect of the superior sagittal sinus extending to the torcula has mildly progressed from the prior CT venogram. Subocclusive thrombus in the transverse and sigmoid sinuses bilaterally is overall mildly improved. 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 The straight sinus, inferior sagittal sinus, vein of Galen, and internal cerebral veins are patent. Extensive venous collaterals are again noted. These results were called by telephone at the time of interpretation on 03/08/2024 at 2:26 p.m. to Dr. Consuelo Denmark, who verbally acknowledged these results. IMPRESSION: 1. Mild progression of extensive thrombus in the superior sagittal sinus posteriorly. 2. Mild improvement of subocclusive thrombus in the transverse and sigmoid sinuses bilaterally. 3. Persistent bilateral cavernous sinus thrombosis. Electronically Signed   By: Aundra Lee M.D.   On: 03/08/2024 15:59   CT ANGIO HEAD NECK W WO CM W PERF (CODE STROKE) Result Date: 03/08/2024 CLINICAL DATA:  Neuro deficit, acute, stroke suspected. Recent history of extensive dural venous sinus thrombosis. EXAM: CT ANGIOGRAPHY HEAD AND NECK CT PERFUSION BRAIN TECHNIQUE: Multidetector CT imaging of the head and neck was performed using the standard protocol during bolus administration of intravenous contrast. Multiplanar CT image reconstructions and MIPs were  obtained to evaluate the vascular anatomy. Carotid stenosis measurements (when applicable) are obtained utilizing NASCET criteria, using the distal internal carotid diameter as the denominator. Multiphase CT imaging of the brain was performed following IV bolus contrast injection. Subsequent parametric perfusion  maps were calculated using RAPID software. RADIATION DOSE REDUCTION: This exam was performed according to the departmental dose-optimization program which includes automated exposure control, adjustment of the mA and/or kV according to patient size and/or use of iterative reconstruction technique. CONTRAST:  OMNIPAQUE  IOHEXOL  350 MG/ML SOLN COMPARISON:  CTA head and neck 02/01/2024.  MRI head 03/02/2024. FINDINGS: CTA NECK FINDINGS Aortic arch: Standard branching. Wide patency of the brachiocephalic and subclavian arteries. Right carotid system: Patent and smooth without evidence of stenosis or dissection. Left carotid system: Patent and smooth without evidence of stenosis or dissection. Vertebral arteries: Patent and smooth without evidence of stenosis or dissection. Strongly dominant left vertebral artery. Skeleton: No acute osseous abnormality or suspicious lesion. Other neck: No evidence of cervical lymphadenopathy or mass. Upper chest: No mass or consolidation in the included lung apices. Review of the MIP images confirms the above findings CTA HEAD FINDINGS Anterior circulation: The internal carotid arteries are widely patent from skull base to carotid termini. ACAs and MCAs are patent without evidence of a proximal branch occlusion or significant proximal stenosis. No aneurysm is identified. Posterior circulation: The intracranial vertebral arteries are patent with the left being strongly dominant. Patent PICA and SCA origins are visualized bilaterally. The basilar artery is widely patent. There are patent posterior communicating arteries bilaterally. Both PCAs are patent without evidence of a significant proximal stenosis. No aneurysm is identified. Venous sinuses: More fully evaluated on the separately reported dedicated CT venogram. Anatomic variants: None. Review of the MIP images confirms the above findings CT Brain Perfusion Findings: ASPECTS: 10 CBF (<30%) Volume: 0mL Perfusion (Tmax>6.0s)  volume: 21mL, located in the parasagittal frontal lobes bilaterally and in the left cerebellar hemisphere Mismatch Volume: 21mL Infarction Location:No acute core infarct identified by CTP. IMPRESSION: 1. No emergent arterial occlusion or significant stenosis the head and neck. 2. 21 mL of prolonged Tmax in the parasagittal frontal lobes and left cerebellar hemisphere with subacute venous ischemia shown in the frontal lobes on recent MRI. No acute core infarct identified by CT perfusion. Electronically Signed   By: Aundra Lee M.D.   On: 03/08/2024 15:58   CT HEAD CODE STROKE WO CONTRAST Result Date: 03/08/2024 CLINICAL DATA:  Code stroke. Neuro deficit, acute, stroke suspected. Right-sided weakness. Recent history of extensive dural venous sinus thrombosis. EXAM: CT HEAD WITHOUT CONTRAST TECHNIQUE: Contiguous axial images were obtained from the base of the skull through the vertex without intravenous contrast. RADIATION DOSE REDUCTION: This exam was performed according to the departmental dose-optimization program which includes automated exposure control, adjustment of the mA and/or kV according to patient size and/or use of iterative reconstruction technique. COMPARISON:  Head CT, CT venogram, and MRI 03/02/2024 FINDINGS: Brain: There is unchanged mild edema in the parasagittal posterior left frontal lobe corresponding to a known subacute infarct. No definite new infarct, intracranial hemorrhage, mass, midline shift, or extra-axial fluid collection is identified. The ventricles are unchanged in size. 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 Vascular: Known extensive dural venous sinus thrombosis, including hyperdense appearance of the posterior aspect of the superior sagittal sinus extending to the torcula. Skull: No acute fracture or suspicious lesion. Sinuses/Orbits: Paranasal sinuses and mastoid air cells are clear. Unremarkable orbits. Other: None. ASPECTS (  [place] Stroke  Program Early CT Score) - Ganglionic level infarction (caudate, lentiform nuclei, internal capsule, insula, M1-M3 cortex): 7 - Supraganglionic infarction (M4-M6 cortex): 3 Total score (0-10 with 10 being normal): 10 These results were communicated to Dr. Christiane Cowing at 2:24 pm on 03/08/2024 by text page via the [facility] messaging system. IMPRESSION: 1. 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 2. No definite new infarct or intracranial hemorrhage. 3. ASPECTS of 10. Electronically Signed   By: Aundra Lee M.D.   On: 03/08/2024 14:24   MR Brain W and Wo Contrast Result Date: 03/02/2024 CLINICAL DATA:  Acute neurologic deficit EXAM: MRI HEAD WITHOUT AND WITH CONTRAST TECHNIQUE: Multiplanar, multiecho pulse sequences of the brain and surrounding structures were obtained without and with intravenous contrast. CONTRAST:  6mL GADAVIST  GADOBUTROL  1 MMOL/ML IV SOLN COMPARISON:  02/01/2024 FINDINGS: Brain: There small areas of abnormal diffusion weighted intensity within the high bilateral paramedian frontal lobes. No definite ADC correlate. Location is similar to findings from 02/01/2024. Areas of signal loss on susceptibility weighted imaging likely reflect engorged cortical veins. There is multifocal hyperintense T2-weighted signal within the white matter. Parenchymal volume and CSF spaces are normal. Vascular: Widespread dural venous sinus thrombosis is better characterized on the concomitant CT venogram. There is mixed occlusive and nonocclusive thrombosis of the superior sagittal sinus, left transverse sinus, left sigmoid sinus and right transverse sinus. 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 Arterial flow voids are normal. Skull and upper cervical spine: Normal calvarium and skull base. Visualized upper cervical spine and soft tissues are normal. Sinuses/Orbits:No paranasal sinus fluid levels or advanced mucosal thickening. No mastoid or middle ear  effusion. Normal orbits. IMPRESSION: 1. Small areas of abnormal diffusion weighted intensity within the high bilateral paramedian frontal lobes, similar to findings from 02/01/2024, consistent with subacute venous ischemia. 2. Widespread mixed occlusive and nonocclusive dural venous sinus thrombosis is better characterized on the concomitant CT venogram. Electronically Signed   By: Juanetta Nordmann M.D.   On: 03/02/2024 20:39   CT HEAD CODE STROKE WO CONTRAST Result Date: 03/02/2024 CLINICAL DATA:  Code stroke. Neuro deficit, acute, stroke suspected. Right-sided facial droop. Right-sided weakness. EXAM: CT VENOGRAM HEAD TECHNIQUE: Venographic phase images of the brain were obtained following the administration of intravenous contrast. Multiplanar reformats and maximum intensity projections were generated. RADIATION DOSE REDUCTION: This exam was performed according to the departmental dose-optimization program which includes automated exposure control, adjustment of the mA and/or kV according to patient size and/or use of iterative reconstruction technique. CONTRAST:  75mL OMNIPAQUE  IOHEXOL  350 MG/ML SOLN COMPARISON:  Prior head CT examinations 02/02/2024 and earlier. Brain MRI 02/01/2024. CT angiogram head/neck 02/01/2024. CT venogram head 02/01/2024. FINDINGS: NON-CONTRAST HEAD CT FINDINGS: Brain: No age-advanced or lobar predominant cerebral atrophy. 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 Known subacute infarcts elsewhere within the bilateral medial frontal lobes, occult by CT and better appreciated on the prior brain MRI of 02/01/2024. Partially empty sella turcica. 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 There is no acute intracranial hemorrhage. No extra-axial fluid collection. No evidence of an  intracranial mass. No midline shift. Vascular: No hyperdense arterial vessel. Venous findings reported below. Skull: No calvarial fracture or aggressive osseous lesion. Sinuses/Orbits: No mass or acute finding  within the imaged orbits. No significant paranasal sinus disease at the imaged levels. CT VENOGRAM HEAD FINDINGS: Residual/recurrent occlusive and subocclusive thrombi within the superior sagittal sinus posteriorly, extending to the confluence of sinuses. Thrombus extends into the medial aspect of the right transverse dural venous sinus. New from the CT venogram head of 02/01/2024, there is some reconstitution of enhancement within the right transverse dural venous sinus more laterally. Clot burden within the right sigmoid sinus has also decreased. Residual/recurrent subocclusive thrombus within the left transverse and sigmoid dural venous sinuses. 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 The straight sinus, inferior sagittal sinus and vein of Galen are patent. The internal cerebral veins are patent. (Series 4, image 128). Extensive venous collaterals again noted. These results were called by telephone at the time of interpretation on 03/02/2024 at 4:59 pm to provider Dr. Doretta Gant, who verbally acknowledged these results. IMPRESSION: Non-contrast head CT: 1. 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 Consider a brain MRI to assess for acute components. 2. Known subacute infarcts elsewhere within the medial frontal lobes bilaterally, occult by CT and better appreciated on the prior brain MRI of 02/01/2024. 3. Partially empty sella turcica. This finding can reflect incidental anatomic variation, or alternatively, it can be associated with chronic idiopathic intracranial hypertension (pseudotumor cerebri). 4. 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 CT venogram head: 1.  Residual/recurrent occlusive and subocclusive thrombi within the superior sagittal sinus posteriorly, extending to the confluence of sinuses. 2. Thrombus extends into the medial aspect of the right transverse dural venous sinus. 3. New from the CT venogram head of 02/01/2024, there is some reconstitution of enhancement within the right transverse dural venous sinus more laterally. Clot burden within the right sigmoid sinus has also decreased. 4. Residual/recurrent subocclusive thrombus within the left transverse and sigmoid dural venous sinuses. 5. 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 6. The straight sinus, inferior sagittal sinus and vein of Galen are now patent. 7. Extensive venous collaterals again noted. Electronically Signed   By: Bascom Lily D.O.   On: 03/02/2024 17:00   CT VENOGRAM HEAD Result Date: 03/02/2024 CLINICAL DATA:  Code stroke. Neuro deficit, acute, stroke suspected. Right-sided facial droop. Right-sided weakness. EXAM: CT VENOGRAM HEAD TECHNIQUE: Venographic phase images of the brain were obtained following the administration of intravenous contrast. Multiplanar reformats and maximum intensity projections were generated. RADIATION DOSE REDUCTION: This exam was performed according to the departmental dose-optimization program which includes automated exposure control, adjustment of the mA and/or kV according to patient size and/or use of iterative reconstruction technique. CONTRAST:  75mL OMNIPAQUE  IOHEXOL  350 MG/ML SOLN COMPARISON:  Prior head CT examinations 02/02/2024 and earlier. Brain MRI 02/01/2024. CT angiogram head/neck 02/01/2024. CT venogram head 02/01/2024. FINDINGS: NON-CONTRAST HEAD CT FINDINGS: Brain: No age-advanced or lobar predominant cerebral atrophy. 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 Known subacute infarcts  elsewhere within the bilateral medial frontal lobes, occult by CT and better appreciated on the prior brain MRI of 02/01/2024. Partially empty sella turcica. 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 There is no acute intracranial hemorrhage. No extra-axial fluid collection. No evidence of an intracranial mass. No midline shift. Vascular: No hyperdense arterial vessel. Venous findings  reported below. Skull: No calvarial fracture or aggressive osseous lesion. Sinuses/Orbits: No mass or acute finding within the imaged orbits. No significant paranasal sinus disease at the imaged levels. CT VENOGRAM HEAD FINDINGS: Residual/recurrent occlusive and subocclusive thrombi within the superior sagittal sinus posteriorly, extending to the confluence of sinuses. Thrombus extends into the medial aspect of the right transverse dural venous sinus. New from the CT venogram head of 02/01/2024, there is some reconstitution of enhancement within the right transverse dural venous sinus more laterally. Clot burden within the right sigmoid sinus has also decreased. Residual/recurrent subocclusive thrombus within the left transverse and sigmoid dural venous sinuses. 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 The straight sinus, inferior sagittal sinus and vein of Galen are patent. The internal cerebral veins are patent. (Series 4, image 128). Extensive venous collaterals again noted. These results were called by telephone at the time of interpretation on 03/02/2024 at 4:59 pm to provider Dr. Doretta Gant, who verbally acknowledged these results. IMPRESSION: Non-contrast head CT: 1. 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 Consider a brain MRI to assess for acute components. 2. Known subacute infarcts elsewhere within the medial frontal lobes bilaterally, occult by CT  and better appreciated on the prior brain MRI of 02/01/2024. 3. Partially empty sella turcica. This finding can reflect incidental anatomic variation, or alternatively, it can be associated with chronic idiopathic intracranial hypertension (pseudotumor cerebri). 4. 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 CT venogram head: 1. Residual/recurrent occlusive and subocclusive thrombi within the superior sagittal sinus posteriorly, extending to the confluence of sinuses. 2. Thrombus extends into the medial aspect of the right transverse dural venous sinus. 3. New from the CT venogram head of 02/01/2024, there is some reconstitution of enhancement within the right transverse dural venous sinus more laterally. Clot burden within the right sigmoid sinus has also decreased. 4. Residual/recurrent subocclusive thrombus within the left transverse and sigmoid dural venous sinuses. 5. 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 6. The straight sinus, inferior sagittal sinus and vein of Galen are now patent. 7. Extensive venous collaterals again noted. Electronically Signed   By: Bascom Lily D.O.   On: 03/02/2024 17:00    Labs:  Basic Metabolic Panel: Recent Labs  Lab 03/24/24 1125 03/25/24 0555 03/26/24 0954 03/27/24 0553 03/28/24 1058 03/29/24 0647  NA 142 140 139 143 139 138  K 3.2* 4.1 3.5 3.8 3.9 3.5  CL 116* 112* 105 105 106 105  CO2 21* 20* 25 23 22 25   GLUCOSE 131* 97 113* 143* 123* 136*  BUN 5* 5* 6 7 9 8   CREATININE 0.81 0.86 0.89 0.83 0.93 0.92  CALCIUM  8.0* 8.9 9.6 10.0 9.5 9.7    CBC: Recent Labs  Lab 03/27/24 0553 03/28/24 1058 03/29/24 0647  WBC 6.6 5.3 7.2  HGB 12.9* 12.6* 13.1  HCT 40.0 40.5 40.6  MCV 81.8 85.1 82.4  PLT 280 260 286    CBG: Recent Labs  Lab 03/23/24 2052  GLUCAP 105*    Brief HPI:   Samuel Warren is a 33 y.o. right-handed male [place] speaking with history significant for deafness and uses some sign language  hyperlipidemia and GERD as well as CVST status post IR for thrombectomy 02/01/2024 as well as recent viral meningitis history of CVA completing course of doxycycline  in the past.  Per chart review patient lives with family.  Independent prior to admission.  He had been  receiving some outpatient therapies.  Recent admission 03/02/2024 - 03/05/2024 for venous infarct within the medial left frontal lobe and MRI consistent with subacute venous ischemia bilateral paramedian frontal lobes widespread mixed occlusive and nonocclusive dural venous sinus.  He was admitted by neurology service for workup and discharged to home on Eliquis .  Presented 03/08/2024 after a witnessed fall and remained on the floor until EMS was dispatched with noted right side weakness left gaze preference.  Reportedly had 2 apneic spells with EMS and required transient BVM.  CT/MRI showed persistent findings of extensive dural venous sinus thrombosis, better characterized on CT venogram performed same day.  Evolving subacute venous ischemic changes involving the paramedian frontal lobes and likely the left occipital lobe, not significantly changes compared to previous.  Mild associated petechial hemorrhage at the posterior left frontal region stable from prior.  No frank hemorrhagic transformation or significant mass effect.  EEG negative for seizure.  Most recent echocardiogram with ejection fraction of 45 to 50% the left ventricle demonstrating regional wall motion abnormality.  Admission chemistries unremarkable except total CK of 262.  Underwent bilateral common carotid arteriograms and internal jugular venograms left CFA approach and right common femoral vein approach that showed absent flow noted no posterior one third the superior sagittal sinus, transverse sinus of the sigmoid sinuses bilaterally including the internal jugular veins proximally.  Multiple attempts at revascularization unsuccessful.  No need for LP at this time given no signs of  CNS infection especially with cerebral edema and low-lying cerebellar tonsils extending up to 9 mm below the foramen magnum.  Neurology follow-up initially placed on IV heparin  as well as 3% saline bolus and transition back to Eliquis  and changed to Coumadin  03/17/2024 for persistent dural venous sinus thrombosis.  Hospital course persistent bouts of headache initially on Topamax  stopped due to low bicarb and transition to Medrol  dose pack.  He was on a regular diet.  Therapy evaluations completed due to patient's decreased functional mobility was admitted for a comprehensive rehab program   Hospital Course: Samuel Warren was admitted to rehab 03/18/2024 for inpatient therapies to consist of PT, ST and OT at least three hours five days a week. Past admission physiatrist, therapy team and rehab RN have worked together to provide customized collaborative inpatient rehab.  Pertaining to patient's dural venous sinus thrombosis remained stable followed by neurology services maintained on Coumadin  with goal INR 2.5-3.5.  Fioricet  as needed for refractory headaches as well as Medrol  Dosepak with little relief.  His headaches are felt to be possibly positional with Zanaflex  added 03/28/2024 and continued on Depakote  250 mg every 12 hours along with Elavil  50 mg nightly as well as Robaxin  as needed.  CT of the head 03/20/2024 showed no significant change in comparison to 03/15/2024.  Cerebral edema weaned from hypertonic saline.  History of CVST admitted 02/01/2024 for CVST status post IR for thrombectomy initially on Eliquis  in the past now transition to Coumadin .  Neurology reconsulted 03/23/2024 for increasing right arm weakness and CTA head and neck showed no emergent large vessel occlusion or high-grade stenosis.  MRI of the brain 03/24/2024 no acute infarct.  EEG follow-up negative for seizure.  History of recent viral meningitis completed course of doxycycline  remained afebrile.  Patient did have bouts of orthostatic  hypotension improved with ProAmatine .  He was initially maintained on some IV fluids.  Hyperlipidemia Lipitor  as advised.  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  Protonix  ongoing for  GERD.  Bouts of constipation abdominal discomfort CT of abdomen 03/19/2024 showed no bowel obstruction free air or free fluid.   Blood pressures were monitored on TID basis and remained controlled and monitored     Rehab course: During patient's stay in rehab weekly team conferences were held to monitor patient's progress, set goals and discuss barriers to discharge. At admission, patient required min mod assist sit to stand mod assist step pivot transfers  Physical exam.  Blood pressure 93/72 pulse 61 temperature 97.8 respirations 18 oxygen saturation 98% room air Constitutional.  No acute distress.  Patient's wife and 2 interpreters in the room HEENT Head.  Normocephalic and atraumatic Comments.  Facial sensation intact to light touch bilaterally tongue midline Eyes.  Pupils round and reactive to light no discharge without nystagmus Neck.  Supple nontender no JVD without thyromegaly Cardiac regular rate and rhythm without any extra sounds or murmur heard Abdomen.  Soft nontender positive bowel sounds without rebound Respiratory effort normal no respiratory distress without wheeze Skin.  Warm and dry Neurologic.  [name] makes eye contact with examiner.  Exam limited by language barrier.  Follows simple commands. Mild ataxia Mild right inattention Mild to moderate searching pattern of bilateral finger-to-nose  He/She  has had improvement in activity tolerance, balance, postural control as well as ability to compensate for deficits. He/She has had improvement in functional use RUE/LUE  and RLE/LLE as well as improvement in awareness.  Patient progressed contact-guard for sitting balance and ambulation hand-held assist and minimal assist.   Communication of been a barrier for patient due to being deaf and non-English-speaking.  OT providing skilled intervention for core strength, static sitting balance and activity tolerance.  OT assisted patient with supine-edge of bed at max assist for sitting edge of bed.  Patient did have a severe anterior lean.       Disposition:  There are no questions and answers to display.         Diet: Regular  Special Instructions: No driving smoking or alcohol  Follow-up office [facility] for Coumadin  therapy weekly checks of INR [phone] fax [phone]  Medications at discharge 1.  Tylenol  as needed 2.  Lipitor  80 mg p.o. daily 3.  MiraLAX  daily hold for loose stools 4.  Multivitamin daily 5.  Protonix  40 mg p.o. daily 6.  Coumadin             with goal INR 2.5-3.5 7.  Elavil  50 mg nightly 8.  Depakote  250 mg every 12 hours 9.  Lidoderm  patch changes directed 10.  Robaxin -750 milligrams every 6 hours as needed muscle spasms 11.  ProAmatine  7.5 mg 3 times daily    30-35 minutes were spent completing discharge summary and discharge planning  Discharge Instructions     Ambulatory referral to Neurology   Complete by: As directed    An appointment is requested in approximately: 4 weeks dural venous thrombosis        Follow-up Information     Lovorn, Jacqlyn Matas, MD Follow up.   Specialty: Physical Medicine and Rehabilitation Why: Office to call for appointment Contact information: [address] [place] [address] [phone]         Lisabeth Rider, MD. Go on 05/05/2024.   Specialties: Neurology, Radiology Why: stroke clinic Contact information: [address] [place] [address] [phone]                 Signed: [name] Rumeal [place]  03/30/2024, 5:12 AM

## 2024-03-18 NOTE — Progress Notes (Signed)
 [name], MD  Physician Physical Medicine and Rehabilitation   PMR Pre-admission    Signed   Date of Service: 03/18/2024  1:43 PM  Related encounter: ED to Hosp-Admission (Current) from 03/08/2024 in [place] [facility]   Signed     Expand All Collapse All  PMR Admission Coordinator Pre-Admission Assessment   Patient: Samuel Warren is an 33 y.o., male MRN: [id number] DOB: 09/18/1991 Height: 5\' 4"  (162.6 cm) Weight: 65 kg   Insurance Information HMO:     PPO:      PCP:      IPA:      80/20:      OTHER:  PRIMARY: UHC       Policy#: [id number]       Subscriber: pt CM Name: Edwina Gram      Phone#: not provided     Fax#: [phone] Pre-Cert#: [id number] auth for CIR from [place] with [facility] with updates due to [place] (phone [phone]) at fax listed above on 5/13.       Employer:  Benefits:  Phone #: [phone]     Name:  Eff. Date: 02/10/24     Deduct: $7500 (met [phone])      Out of Pocket Max: $7500 [phone])      Life Max: n/a CIR: 100% after deductible       SNF: 100% Outpatient: 100%     Co-Pay:  Home Health:  100%      Co-Pay:  DME: 100%     Co-Pay:  Providers:  SECONDARY:       Policy#:      Phone#:    Artist:       Phone#:    The "Data Collection Information Summary" for patients in Inpatient Rehabilitation Facilities with attached "Privacy Act Statement-Health Care Records" was provided and verbally reviewed with: Patient and Family   Emergency Contact Information Contact Information       Name Relation Home Work Mobile    [place] Brother     [phone]         Other Contacts       Name Relation Home Work Mobile    [place] Other     [phone]    [place] Father     [phone]           Current Medical History  Patient Admitting Diagnosis: dural venous sinus thrombosis   History of Present Illness:  Pt is a 33 year old right-handed male with PMH significant for deafness (uses some sign language-more  gestures- -not ASL), hyperlipidemia and GERD as well 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  He was admitted at that time by neurology services for workup and discharged to home on Eliquis .  Presented 03/08/2024 after a witnessed fall and remained on the floor until EMS was dispatched with noted right side weakness left gaze preference.  Reportedly patient had 2 apneic spells with EMS and required transient BVM.  CT/MRI showed persistent findings of extensive dural venous sinus thrombosis, better characterized on CT venogram performed the same day.  Evolving subacute venous ischemic changes involving the paramedian frontal lobes and likely the left occipital lobe, not significantly changed as compared to previous.  Mild associated  petechial hemorrhage at the posterior left frontal region, stable from prior.  No frank hemorrhagic transformation or significant mass effect.  EEG negative for seizure.  Most recent echocardiogram with ejection fraction of 45 to 50% the left ventricle demonstrating regional wall motion abnormality.  Admission chemistries unremarkable except WBC 12,600, glucose 120, total CK of 262.  Underwent bilateral common carotid arteriograms and internal jugular venograms left CFA approach and right common femoral vein approach that showed absent flow noted no posterior one third the superior sagittal sinus, the transverse sinuses of the sigmoid sinuses bilaterally including the internal jugular veins proximally.  Multiple attempts at revascularization unsuccessful.  No need for LP at this time given no sign of CNS infection especially with cerebral edema and low-lying  cerebellar tonsils extending up to 9 mm below the foramen magnum.  Neurology follow-up initially placed on IV heparin  as well as 3% saline with bolus and has been transitioned back to Eliquis  and changed to Coumadin 03/17/2024 for persistent dural venous sinus thrombus.  Hospital course persistent bouts of headaches initially on Topamax  stopped due to low bicarb and transition to Medrol Dosepak and Depacon.  He is on a regular diet.  Therapy evaluations completed and pt was recommended for a comprehensive rehab program.     Complete NIHSS TOTAL: 6   Patient's medical record from [facility] has been reviewed by the rehabilitation admission coordinator and physician.   Past Medical History      Past Medical History:  Diagnosis Date   Deaf            Has the patient had major surgery during 100 days prior to admission? Yes   Family History   family history is not on file.   Current Medications  Current Medications    Current Facility-Administered Medications:    acetaminophen  (TYLENOL ) tablet 650 mg, 650 mg, Oral, Q4H PRN, 650 mg at 03/16/24 0903 **OR** acetaminophen  (TYLENOL ) 160 MG/5ML solution 650 mg, 650 mg, Per Tube, Q4H PRN **OR** acetaminophen  (TYLENOL ) suppository 650 mg, 650 mg, Rectal, Q4H PRN, Deveshwar, Sanjeev, MD   alum & mag hydroxide-simeth (MAALOX/MYLANTA) 200-200-20 MG/5ML suspension 30 mL, 30 mL, Oral, Q4H PRN, Haydee Lipa, MD, 30 mL at 03/16/24 1610   atorvastatin  (LIPITOR ) tablet 80 mg, 80 mg, Oral, Daily, Arne Langdon, MD, 80 mg at 03/16/24 1000   butalbital -acetaminophen -caffeine  (FIORICET ) 50-325-40 MG per tablet 1 tablet, 1 tablet, Oral, Q8H PRN, Consuelo Denmark, MD, 1 tablet at 03/13/24 1022   Chlorhexidine  Gluconate Cloth 2 % PADS 6 each, 6 each, Topical, Q0600, Desai, Nikita S, MD, 6 each at 03/16/24 0617   feeding supplement (ENSURE ENLIVE / ENSURE PLUS) liquid 237 mL, 237 mL, Oral, BID BM, Haydee Lipa, MD, 237 mL at 03/16/24 1000   iohexol   (OMNIPAQUE ) 300 MG/ML solution 150 mL, 150 mL, Intra-arterial, Once PRN, Deveshwar, Sanjeev, MD   methylPREDNISolone (MEDROL DOSEPAK) tablet 4 mg, 4 mg, Oral, 4X daily taper, Dang, Thuy D, RPH, 4 mg at 03/16/24 0800   ondansetron  (ZOFRAN ) tablet 4 mg, 4 mg, Oral, Q6H PRN, Arne Langdon, MD   Oral care mouth rinse, 15 mL, Mouth Rinse, PRN, Mannam, Praveen, MD   pantoprazole  (PROTONIX ) EC tablet 40 mg, 40 mg, Oral, Daily, Arne Langdon, MD, 40 mg at 03/16/24 1000   polyethylene glycol (MIRALAX  / GLYCOLAX ) packet 17 g, 17 g, Oral, BID, Haydee Lipa, MD, 17 g at 03/16/24 1124   senna (SENOKOT) tablet 8.6 mg, 1 tablet, Oral, BID, Sethi, Pramod S,  MD, 8.6 mg at 03/16/24 1000   sodium chloride  tablet 2 g, 2 g, Oral, TID WC, Consuelo Denmark, MD, 2 g at 03/16/24 1130   valproate (DEPACON) 500 mg in dextrose  5 % 50 mL IVPB, 500 mg, Intravenous, Q6H, Imogene Mana, NP, Stopped at 03/16/24 1610   Warfarin - Pharmacist Dosing Inpatient, , Does not apply, q1600, Dang, Thuy D, RPH, 2.5 each at 03/15/24 1638     Patients Current Diet:  Diet Order                  DIET DYS 3 Room service appropriate? Yes; Fluid consistency: Thin  Diet effective now                         Precautions / Restrictions Precautions Precautions: Fall, Other (comment) Precaution/Restrictions Comments: R cervical rotation and some R inattention Restrictions Weight Bearing Restrictions Per Provider Order: No    Has the patient had 2 or more falls or a fall with injury in the past year? Yes   Prior Activity Level Limited Community (1-2x/wk): was receiving outpatient therapy immediately prior to admit, but before recent hospitalizations was independent and working   Prior Functional Level Self Care: Did the patient need help bathing, dressing, using the toilet or eating? Needed some help   Indoor Mobility: Did the patient need assistance with walking from room to room (with or without device)? Needed some help    Stairs: Did the patient need assistance with internal or external stairs (with or without device)? Needed some help   Functional Cognition: Did the patient need help planning regular tasks such as shopping or remembering to take medications? Needed some help   Patient Information Are you of Hispanic, Latino/a,or Spanish origin?: A. No, not of Hispanic, Latino/a, or Spanish origin What is your race?: I. Vietnamese Do you need or want an interpreter to communicate with a doctor or health care staff?: 1. Yes Sherline Distel and sign language (call [phone] for interpreter who has worked with patient for multiple hospitalizations))   Patient's Response To:  Health Literacy and Transportation Is the patient able to respond to health literacy and transportation needs?: No   Home Assistive Devices / Equipment Home Equipment: Agricultural consultant (2 wheels)   Prior Device Use: Indicate devices/aids used by the patient prior to current illness, exacerbation or injury? None of the above   Current Functional Level Cognition   Orientation Level: (P) Other (comment) (nonverbal; deaf)    Extremity Assessment (includes Sensation/Coordination)   Upper Extremity Assessment: RUE deficits/detail RUE Deficits / Details: 3/5 lifts against gravity, grip is improving RUE Sensation: decreased light touch, decreased proprioception RUE Coordination: decreased fine motor, decreased gross motor  Lower Extremity Assessment: Defer to PT evaluation     ADLs   Overall ADL's : Needs assistance/impaired Eating/Feeding: Minimal assistance, Sitting Grooming: Wash/dry face, Set up, Sitting Upper Body Bathing: Moderate assistance, Sitting, Bed level Lower Body Bathing: Maximal assistance, Sitting/lateral leans, Bed level Upper Body Dressing : Moderate assistance, Sitting, Bed level Lower Body Dressing: Maximal assistance, Sitting/lateral leans, Sit to/from stand Toilet Transfer: Maximal assistance, +2 for physical  assistance, Moderate assistance Toileting- Clothing Manipulation and Hygiene: Maximal assistance Functional mobility during ADLs: Moderate assistance, Maximal assistance, +2 for physical assistance     Mobility   Overal bed mobility: Needs Assistance Bed Mobility: Supine to Sit Sidelying to sit: Mod assist, +2 for physical assistance, HOB elevated Supine to sit: Mod assist, HOB elevated Sit to  supine: Max assist, +2 for physical assistance, HOB elevated General bed mobility comments: Increased assist returning to supine due to pt fatigue     Transfers   Overall transfer level: Needs assistance Equipment used: 2 person hand held assist, Rolling walker (2 wheels) Transfers: Sit to/from Stand, Bed to chair/wheelchair/BSC Sit to Stand: +2 physical assistance, Max assist Bed to/from chair/wheelchair/BSC transfer type:: Step pivot Step pivot transfers: Mod assist, +2 physical assistance General transfer comment: posterior lean throughout and R knee blocked while pt stepping with LLE due to RLE weakness/buckling. Manual assist to weight shift for offloading pressure when side stepping with opposite leg.     Ambulation / Gait / Stairs / Wheelchair Mobility   Ambulation/Gait Ambulation/Gait assistance: Mod assist, Max assist, +2 physical assistance Gait Distance (Feet): 4 Feet Assistive device: Rolling walker (2 wheels) Gait Pattern/deviations: Step-to pattern, Decreased stride length, Decreased dorsiflexion - right, Decreased step length - right General Gait Details: pt walking ~4' with RW and mod-maxA+2, chair follow. pt with R lateral lean though improved with forward weight translation, initially taking complete steps bilaterally though requiring increased cues to step with RLE; seated rest due to fatigue Gait velocity: Decreased Pre-gait activities: a few shuffled steps toward his L side with +2 maxA see transfers section     Posture / Balance Dynamic Sitting Balance Sitting balance -  Comments: R lateral and posterior lean, only briefly improves with seated anterior reaching activity Balance Overall balance assessment: Needs assistance Sitting-balance support: No upper extremity supported, Feet unsupported Sitting balance-Leahy Scale: Poor Sitting balance - Comments: R lateral and posterior lean, only briefly improves with seated anterior reaching activity Postural control: Right lateral lean, Left lateral lean, Posterior lean Standing balance support: Bilateral upper extremity supported Standing balance-Leahy Scale: Zero Standing balance comment: +2 modA static standing, progressing to +2 maxA for dynamic standing/weight shifting tasks     Special needs/care consideration Special service needs interpreters Sherline Distel and Sign Language)    Previous Home Environment (from acute therapy documentation) Living Arrangements: Parent, Other relatives Available Help at Discharge: Family, Available 24 hours/day Type of Home: House Home Layout: One level Home Access: Stairs to enter Entrance Stairs-Rails: Left, Right Entrance Stairs-Number of Steps: 7 Bathroom Shower/Tub: Engineer, manufacturing systems: Standard   Discharge Living Setting Plans for Discharge Living Setting: Patient's home, Lives with (comment) (8 family members (parents, spouse, siblings)) Type of Home at Discharge: House Discharge Home Layout: One level Discharge Home Access: Stairs to enter Entrance Stairs-Rails: Left, Right Entrance Stairs-Number of Steps: 7 Discharge Bathroom Shower/Tub: Tub/shower unit Discharge Bathroom Toilet: Standard Discharge Bathroom Accessibility: No Does the patient have any problems obtaining your medications?: No   Social/Family/Support Systems Patient Roles: Parent Contact Information: 1 y/o daughter Anticipated Caregiver: spouse and other family members Anticipated Industrial/product designer Information: spouse usually at bedside (needs [place] interpreter), brother Ola Berger  speaks English for detailed convos  [phone] Ability/Limitations of Caregiver: none stated, good family support Caregiver Availability: 24/7 Discharge Plan Discussed with Primary Caregiver: Yes Is Caregiver In Agreement with Plan?: Yes Does Caregiver/Family have Issues with Lodging/Transportation while Pt is in Rehab?: No   Goals Patient/Family Goal for Rehab: PT/OT min assist, SLP min assist Expected length of stay: 18-21 days Cultural Considerations: pt is from [place] community [place]) and deaf, has his own sign language he uses with his family Additional Information: Discharge plan: home with family 24/7, brother is primary contact for detailed communication, spouse is usually at bedside and needs interpreter Pt/Family Agrees to Admission and  willing to participate: Yes Program Orientation Provided & Reviewed with Pt/Caregiver Including Roles  & Responsibilities: Yes   Decrease burden of Care through IP rehab admission: n/a   Possible need for SNF placement upon discharge:  Not anticipated.  Plan for discharge home with 24/7 support from multiple family members   Patient Condition: I have reviewed [facility] records from [facility], spoken with CM, and patient, spouse, and family member. I met with patient at the bedside and discussed via phone for inpatient rehabilitation assessment.  Patient will benefit from ongoing PT, OT, and SLP, can actively participate in 3 hours of therapy a day 5 days of the week, and can make measurable gains during the admission.  Patient will also benefit from the coordinated team approach during an Inpatient Acute Rehabilitation admission.  The patient will receive intensive therapy as well as Rehabilitation physician, nursing, social worker, and care management interventions.  Due to safety, skin/wound care, disease management, medication administration, pain management, and patient education the patient requires 24 hour a day rehabilitation nursing.  The  patient is currently max +2 with mobility and basic ADLs.  Discharge setting and therapy post discharge at home with home health is anticipated.  Patient has agreed to participate in the Acute Inpatient Rehabilitation Program and will admit today.   Preadmission Screen Completed By:  Mickey Alar, PT, DPT 03/16/2024 11:51 AM ______________________________________________________________________   Discussed status with Dr. Lovorn on 03/18/24  at 9:47 AM  and received approval for admission today.   Admission Coordinator:  Dakarai Mcglocklin E Maurie Musco, PT, DPT time 9:47 AM Alanna Hu 03/18/24      Assessment/Plan: Diagnosis: CSVT- dural venous sinus thrombosis Does the need for close, 24 hr/day Medical supervision in concert with the patient's rehab needs make it unreasonable for this patient to be served in a less intensive setting? Yes Co-Morbidities requiring supervision/potential complications:  On Coumadin- INR 2.3; congenital deafness; recent stroke L frontal lobe; rcent encephalitis- HA's-  abd pain-  Due to bladder management, bowel management, safety, skin/wound care, disease management, medication administration, pain management, and patient education, does the patient require 24 hr/day rehab nursing? Yes Does the patient require coordinated care of a physician, rehab nurse, PT, OT, and SLP to address physical and functional deficits in the context of the above medical diagnosis(es)? Yes Addressing deficits in the following areas: balance, endurance, locomotion, strength, transferring, bowel/bladder control, bathing, dressing, feeding, grooming, toileting, cognition, speech, and language Can the patient actively participate in an intensive therapy program of at least 3 hrs of therapy 5 days a week? Yes The potential for patient to make measurable gains while on inpatient rehab is good Anticipated functional outcomes upon discharge from inpatient rehab: min assist PT, min assist OT, min assist  SLP Estimated rehab length of stay to reach the above functional goals is: 18-21 days Anticipated discharge destination: Home 10. Overall Rehab/Functional Prognosis: good     MD Signature:           Revision History

## 2024-03-18 NOTE — Progress Notes (Signed)
 Nutrition Follow-up  DOCUMENTATION CODES:   Not applicable  INTERVENTION:   Encourage good PO intake  Obtain new weight  Trial Boost Breeze po TID, each supplement provides 250 kcal and 9 grams of protein Magic cup TID with meals, each supplement provides 290 kcal and 9 grams of protein Continue Multivitamin w/ minerals daily Allow family to bring outside food to help increase PO intake  Discontinue calorie count and Ensure Enlive  NUTRITION DIAGNOSIS:   Inadequate oral intake related to decreased appetite as evidenced by meal completion < 25%.  - Progressing, had 90-100% of meals yesterday   GOAL:   Patient will meet greater than or equal to 90% of their needs  - Progressing   MONITOR:   PO intake, Supplement acceptance, Weight trends, I & O's  REASON FOR ASSESSMENT:   Consult Calorie Count  ASSESSMENT:   33 y.o. male presented to the ED with new onset R side weakness. PMH includes deaf, GERD, meningoencephalitis, and hyperlipidemia. Pt admitted with worsening mental status and persistent dural venous sinus thrombus.   Patient lying in fetal position on visit. No family bedside.  Unable to perform NFPE, deferred to follow up. Breakfast tray noted at bedside tray table. Patient had 100% yogurt and 2 pieces of bacon for BF. Per RN patient has been eating better,consumed 90-100% of meals yesterday. Not drinking Ensures, will try Boost Breeze. Family has been bringing in meals as well. Needs new weight, discussed with RN. RN to provide updated weight. CIR following.   Calorie Count Results 5/06  48 hour calorie count ordered.  Breakfast: 170 calories, 5 gm protein Lunch: No documentation Dinner: 135 calories, 8 gm protein  Supplements: 175 calories, 10 gm protein  Total intake: 480 kcal (25% of minimum estimated needs)  23 protein (26% of minimum estimated needs)   Calorie Count Results 5/07:   Breakfast: 260 kcal, 28 gm protein  Lunch: 300 kcal, 28 gm protein   Dinner: 415 kcal, 33 gm protein  Supplements: 0 Ensures   Total intake: 975 kcal (25% of minimum estimated needs)  89 gm  protein (26% of minimum estimated needs)  Patient was being considered for PEG placement due to poor PO intake. Pt now with improving appetite. RD will try to to optimize nutrition with ONS.   Admit weight: 65 kg  Current weight: 65 kg    Average Meal Intake: 5/4: 10% x 1 meals documented  5/5:  18% x 2 meals documented  5/6:  25% x 2 meals documented  5/7: 96% x 2 meals documented   Nutritionally Relevant Medications: Scheduled Meds:  atorvastatin   80 mg Oral Daily   divalproex  500 mg Oral Q12H   feeding supplement  237 mL Oral TID BM   methylPREDNISolone  4 mg Oral 4X daily taper   multivitamin with minerals  1 tablet Oral Daily   pantoprazole   40 mg Oral Daily   polyethylene glycol  17 g Oral BID   senna  1 tablet Oral BID   sodium chloride   2 g Oral TID WC   warfarin  4 mg Oral ONCE-1600   Warfarin - Pharmacist Dosing Inpatient   Does not apply q1600   Labs Reviewed: Potassium 3.1 CBG ranges from 110-185 mg/dL over the last 24 hours HgbA1c 5.7  NUTRITION - FOCUSED PHYSICAL EXAM:  - Deferred to follow up. Patient lying in fetal position..   Diet Order:   Diet Order  Diet - low sodium heart healthy           Diet regular Room service appropriate? Yes with Assist; Fluid consistency: Thin  Diet effective now                   EDUCATION NEEDS:   No education needs have been identified at this time  Skin:  Skin Assessment: Reviewed RN Assessment  Last BM:  03/17/24 - type 7  Height:   Ht Readings from Last 1 Encounters:  03/08/24 5\' 4"  (1.626 m)    Weight:   Wt Readings from Last 1 Encounters:  03/08/24 65 kg    Ideal Body Weight:  59.1 kg  BMI:  Body mass index is 24.6 kg/m.  Estimated Nutritional Needs:   Kcal:  1950-2150  Protein:  90-110 grams  Fluid:  >/= 1.9 L   Frederik Jansky, RD Registered  Dietitian  See Amion for more information

## 2024-03-18 NOTE — Progress Notes (Signed)
 STROKE TEAM PROGRESS NOTE   INTERIM HISTORY/SUBJECTIVE His nurse is at the bedside.  He is in good spirit.  Sitting up in bed.  Eating his meal.  His appetite is improving..   INR is optimal at 2.3 and is medically stable to be transferred to rehab when bed available.  Vital signs stable.  Neurological exam unchanged.   OBJECTIVE  CBC    Component Value Date/Time   WBC 8.2 03/18/2024 0551   RBC 4.57 03/18/2024 0551   HGB 12.2 (L) 03/18/2024 0551   HCT 37.1 (L) 03/18/2024 0551   PLT 228 03/18/2024 0551   MCV 81.2 03/18/2024 0551   MCH 26.7 03/18/2024 0551   MCHC 32.9 03/18/2024 0551   RDW 13.1 03/18/2024 0551   LYMPHSABS 1.5 03/10/2024 1032   MONOABS 1.0 03/10/2024 1032   EOSABS 0.0 03/10/2024 1032   BASOSABS 0.0 03/10/2024 1032    BMET    Component Value Date/Time   NA 139 03/16/2024 0528   K 3.1 (L) 03/16/2024 0528   CL 111 03/16/2024 0528   CO2 20 (L) 03/16/2024 0528   GLUCOSE 117 (H) 03/16/2024 0528   BUN 11 03/16/2024 0528   CREATININE 1.08 03/16/2024 0528   CALCIUM  9.5 03/16/2024 0528   GFRNONAA >60 03/16/2024 0528    IMAGING past 24 hours No results found.    Vitals:   03/17/24 1940 03/17/24 2259 03/18/24 0309 03/18/24 0744  BP: 102/77 122/81 93/72 104/68  Pulse: 62 (!) 57 61 61  Resp: 15 15 13 12   Temp: 98.5 F (36.9 C) 97.9 F (36.6 C) 97.8 F (36.6 C) 98.4 F (36.9 C)  TempSrc: Oral Oral Axillary Oral  SpO2: 98% 98% 98% 96%  Weight:      Height:         PHYSICAL EXAM General:  Alert, well-nourished, well-developed patient in no acute distress Psych:  Mood and affect appropriate for situation CV: Regular rate and rhythm on monitor Respiratory:  Regular, unlabored respirations on room air GI: Abdomen soft and nontender NEURO:  Pt is awake, alert, eyes open, smile to provider. No gaze palsy, tracking bilaterally, blinking to visual threat bilaterally, PERRL. No facial droop. Tongue midline. Bilateral UEs 5/5, no drift. Bilaterally LEs 5/5, no  drift. b/l FTN intact, gait not tested.     ASSESSMENT/PLAN  [name] is a 33 y.o. 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 CT head with no acute process. 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 CTA and CTV obtained. Heparin  gtt initiated. NIH on Admission 25  Worsening mental status, improved Patient seems more drowsy sleepy, hard to arouse on 4/29 CT repeat 4/29 showed increased cerebral edema Transferred to ICU for 3% saline with bolus S/p IR but not successful with IR Dr. Alvira Warren Mental status much improved, back to basel ine now Tape down 3% saline  On LTM EEG - no seizure -> d/c  Persistent dural venous sinus thrombus:   Code Stroke CT head -  Unchanged small subacute left frontal lobe infarct in the setting of known dural venous sinus thrombosis. ASPECTS of 10. CTA head & neck No LVO CT Venogram Mild progression of extensive thrombus in the superior sagittal sinus posteriorly. Mild improvement of subocclusive thrombus in the  transverse and sigmoid sinuses bilaterally. Persistent bilateral cavernous sinus thrombosis. MRI  Evolving subacute venous ischemic changes involving the paramedian  frontal lobes and likely the left occipital lobe, not significantly changed as compared to previous. Mild associated petechial hemorrhage at the posterior left frontal region, stable from prior.  4/29- Stat Head CT generalized brain swelling.  On personal review, seems more cerebral edema than 4/28 CT head 03/15/2024-no significant change compared with CT 03/09/2024 3/18- 2D Echo - EF 45-50% in 01/2024-EEG- negative for seizure, mild to moderate diffuse encephalopathy LDL 58 HgbA1c 5.7 VTE prophylaxis - Heparin  IV Eliquis  (apixaban ) daily prior to admission, now on eliquis   Therapy recommendations:   CIR Disposition:  pending  Cerebral edema Transferred to ICU on 4/29 due to decreased LOC Hypertonic saline started at 66ml/hr->40cc -> 20cc for 12 hours and then d/c goal sodium 150-155 Serial neurochecks Q6 Na Na 144 --141--[id number] Add Na tab 2g tid Headaches- likely due to cerebral edema - topamax  stopped 5/1 due to low bicarb.  Put on Diamox  500mg  bid x2 and BMP tomorrow if still low bicarb will need to consider alternatives  Hx of CVST Admitted 02/01/2024 for CVST.  Status post IR for thrombectomy and discharged on Eliquis . Readmitted 03/02/2024 for right-sided weakness.  CT head showed increased left frontal venous infarct.  CTV showed recurrent occlusive and subocclusive thrombi within the SSS posteriorly, extending to the confluence of sinus.  Thrombus extending into the medial aspect of the right transverse sinus.  Clot burden within the right sigmoid sinus has decreased.  Patient was concerning for dehydration at the time, and received fluid resuscitation.  Discharged in good condition However, per wife, patient discharged on 4/25 Friday, at the night of that day, patient drowsy sleepy not talking.  Saturday some improvement but not eating and drinking well able to take medication.  Sunday she did a poorly, drowsy sleepy not talking not eating, however wife was able to get medication down.  Monday yesterday, he was sent to ER for evaluation.  Hx of Stroke Recent viral meningitis January 27, 2024 right frontal 2-3 punctate infarcts, etiology: Likely due to infectious vasculitis vs. Small vessel disease in the setting of CNS infection  CSF at that time WBC 22, RBC 1330, protein 91, glucose 80, concerning for viral meningitis Completed course of Doxy No need LP repeat at this time given no sign of CNS infection and especially with cerebral edema and low-lying cerebellar tonsils extending up to 9 mm below the foreman magnum  Hyperlipidemia Home meds:  Atorvastatin  80,  resumed LDL in 01/2024 279 This admission LDL 58, goal < 70 Continue statin on discharge  Dysphagia SLP consulted On dys 3 and thin liquid Advance diet as tolerated  Other Active Problems Hypokalemia K 3.2 - 2.9 - supplement -> 3.7  Hospital day # 10     His RN is at the bedside..  Patient seems to be in good spirits today.  He states he has been eating well.  INR is optimal   continue warfarin.  Hopefully transfer to floor and rehab in few days.    Ardella Beaver, MD  To contact Stroke Continuity provider, please refer to [email or web address]. After hours, contact General Neurology

## 2024-03-18 NOTE — Discharge Summary (Addendum)
 Physician Discharge Summary  Samuel Warren VWU:[id number] DOB: [birth year] DOA: 03/08/2024  PCP: Alto Atta, NP  Admit date: 03/08/2024 Discharge date: 03/18/2024  Time spent: 23 minutes  Recommendations for Outpatient Follow-up:  Requires Coumadin goal INR between 2 and 3 Use sodium tablets for the next 3 to 4 days for fluid edema and then can discontinue if sodium remains above 140 Going to rehab for further management  Discharge Diagnoses:  MAIN problem for hospitalization   Cerebral edema with headaches Recent chronic venous sinus thrombosis  Please see below for itemized issues addressed in HOpsital- refer to other progress notes for clarity if needed  Discharge Condition: Improved  Diet recommendation: Regular  Filed Weights   03/08/24 1400  Weight: 65 kg    History of present illness:  32 year old male 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 treated with doxycycline  at discharge-CSF cultures overall negative blood cultures negative felt to possibly be secondary to tick bite after while [place] hunting trip Rehospitalized 3/23 through 4/1 with severe headache CT in ED showed multiple venous thrombi without hypodensity-left common carotid artery showed extensive dural venous sinus thrombus in posterior aspect of superior sagittal sinus-echo 45-50%-on 3/24 revascularization recommended and performed-was told to continue Eliquis  for 6 to 9 months Readmitted 4/22 with right-sided weakness-venous infarct spanning 2.6 cm was admitted for stroke workup and discharged Readmit 4/28 being found down on the ground CT head unchanged small subacute left frontal lobe infarct in setting of known venous sinus thrombosis CTA CTV obtained heparin  drip was started Critical care consulted because of apneic 4/29 IR thrombectomy vena mesh unsuccessful 4/30 continuous EEG unremarkable 5/2 hypertonic saline discontinued 5/4 transferred to hospitalist  service  Hospital Course:  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% Seems euvolemic and compensated-only use Lipitor  for now   Hiccups Started this hospitalization will prescribe chlorpromazine tablets 10 3 times daily as needed \  Congenitally deaf Communicates with interpreter    Discharge Exam: Vitals:   03/18/24 0744 03/18/24 1218  BP: 104/68 109/69  Pulse: 61 86  Resp: 12 13  Temp: 98.4 F (36.9 C) 98.5 F (36.9 C)  SpO2: 96% 97%    Subj on day of d/c   Looks fair  HEadache about 6/10 no fever overall eating no cp   General Exam on discharge  Eomi nca tno focal defcicit Abd sfot nt nd no rebound no guard Power 5/5 S1 s2 no mr/g  Discharge Instructions   Discharge Instructions     Diet - low sodium heart healthy   Complete by: As  directed    Increase activity slowly   Complete by: As directed       Allergies as of 03/18/2024   No Known Allergies      Medication List      STOP taking these medications    Eliquis  5 MG Tabs tablet Generic drug: apixaban    ondansetron  4 MG tablet Commonly known as: ZOFRAN    topiramate  25 MG tablet Commonly known as: TOPAMAX        TAKE these medications    acetaminophen  325 MG tablet Commonly known as: TYLENOL  Take 2 tablets (650 mg total) by mouth every 4 (four) hours as needed for mild pain (pain score 1-3) (or temp > 37.5 C (99.5 F)).   atorvastatin  80 MG tablet Commonly known as: LIPITOR  Take 1 tablet (80 mg total) by mouth daily.   butalbital -acetaminophen -caffeine  50-325-40 MG tablet Commonly known as: FIORICET  Take 1 tablet by mouth every 6 (six) hours as needed (refractory HA.).   chlorproMAZINE 10 MG tablet Commonly known as: THORAZINE Take 1 tablet (10 mg total) by mouth 3 (three) times daily as needed for hiccoughs.   divalproex 500 MG DR tablet Commonly known as: DEPAKOTE Take 1 tablet (500 mg total) by mouth every 12 (twelve) hours.   pantoprazole  40 MG tablet Commonly known as: PROTONIX  Take 1 tablet (40 mg total) by mouth daily. What changed: when to take this   warfarin 4 MG tablet Commonly known as: COUMADIN Take 1 tablet (4 mg total) by mouth one time only at 4 PM.       No Known Allergies    The results of significant diagnostics from this hospitalization (including imaging, microbiology, ancillary and laboratory) are listed below for reference.    Significant Diagnostic Studies: CT HEAD POST STROKE FOLLOWUP/TIMED/STAT READ Result Date: 03/15/2024 CLINICAL DATA:  Code stroke. Neuro deficit, acute, stroke suspected. EXAM: CT HEAD WITHOUT CONTRAST TECHNIQUE: Contiguous axial images were obtained from the base of the skull through the vertex without intravenous contrast. RADIATION DOSE REDUCTION: This exam was performed according to the departmental dose-optimization program which includes automated exposure control, adjustment of the mA and/or kV according to patient size and/or  use of iterative reconstruction technique. COMPARISON:  Prior head CT examinations 03/09/2024 and earlier. CT venogram head 03/08/2024. Prior brain MRI examinations 03/08/2024 and earlier. FINDINGS: Brain: Known subacute foci of venous parenchymal ischemia within the medial frontal lobes (left greater than right), not appreciably changed in extent from the prior brain MRI of 03/08/2024. A small focus of subacute venous parenchymal ischemia within the left occipital lobe was better appreciated on the prior MRI of 03/08/2024. No evidence of frank hemorrhagic conversion. Known cerebellar tonsillar ectopia, incompletely imaged and incompletely assessed. No interval acute infarct is identified. No extra-axial fluid collection. No evidence of an intracranial mass. No midline shift. Vascular: No hyperdense arterial vessel. Some areas of persistent hyperdensity in the venous sinuses consistent with known venous thrombotic disease. Skull: No calvarial fracture or aggressive osseous lesion. Sinuses/Orbits: No mass or acute finding within the imaged orbits. Mild mucosal thickening scattered within the paranasal sinuses at the imaged levels. IMPRESSION: 1. No significant change as compared to the non-contrast head CT of 03/09/2024. 2. Known foci of subacute venous parenchymal ischemia within the medial frontal lobes and left occipital lobe, some of which were better appreciated on the prior MRI of 03/08/2024. No evidence of frank hemorrhagic conversion. 3. Some areas of persistent hyperdensity in the venous sinuses consistent with known venous thrombotic disease. 4. Known cerebellar  tonsillar ectopia, incompletely imaged and incompletely assessed. 5. Mild paranasal sinus mucosal thickening at the imaged levels. Electronically Signed   By: Bascom Lily D.O.   On: 03/15/2024 16:33   IR Veno Sagittal Sinus Result Date: 03/12/2024 CLINICAL DATA:  Decreased level of consciousness. Somnolent. Progression of dural venous thrombosis  on CT venogram of the head. EXAM: BILATERAL COMMON CAROTID AND INNOMINATE ANGIOGRAPHY COMPARISON:  CT venogram and CT of the brain of 03/09/2024. MEDICATIONS: Heparin  2000 units IV; no antibiotic was administered within 1 hour of the procedure. ANESTHESIA/SEDATION: General anesthesia. CONTRAST:  Omnipaque  300 approximately 85 mL. FLUOROSCOPY TIME:  Fluoroscopy Time: 96 minutes 18 seconds (1478 mGy). COMPLICATIONS: None immediate. TECHNIQUE: Informed written consent was obtained from the patient after a thorough discussion of the procedural risks, benefits and alternatives. All questions were addressed. Maximal Sterile Barrier Technique was utilized including caps, mask, sterile gowns, sterile gloves, sterile drape, hand hygiene and skin antiseptic. A timeout was performed prior to the initiation of the procedure. The right groin was prepped and draped in the usual sterile fashion. Thereafter using modified Seldinger technique, transfemoral access into the left common femoral artery was obtained without difficulty. Over an 0.035 inch guidewire, a 5 French Pinnacle sheath was inserted. Through this, and also over an 0.035 inch guidewire, a 5 [place] JB 1 catheter was advanced to the aortic arch region and selectively positioned in the right common carotid artery and the left common carotid artery. Using a similar technique, access into the right common femoral vein was obtained over an 0.035 inch guidewire and an 8 French short Pinnacle sheath. Over the 035 inch guidewire, a 5 [place] JB 1 catheter was then advanced through the inferior vena cava via the right atrium, and into the right internal jugular vein initially, and then into the left internal jugular vein. On the right side, a 088 80 cm Neuron Max sheath was advanced and positioned in the proximal right internal jugular vein. The guidewire was removed. Good aspiration obtained from the hub of the 8 [place] Neuron Max sheath which was then connected to continuous  heparinized saline infusion. FINDINGS: The right common carotid arteriogram demonstrates the right external carotid artery and its major branches to be widely patent. The right internal carotid artery at the bulb to the cranial skull base is widely patent. The petrous, the cavernous and the supraclinoid right ICA are widely patent. Right posterior communicating artery is present. The right middle and the right anterior cerebral artery opacify into the capillary and venous phases. Cross filling with the anterior communicating artery of the left anterior cerebral artery A2 segment and distally is evident. Venous phase again demonstrates opacification of the proximal 2/3 of the superior sagittal sinus. The posterior half of the superior sagittal sinus demonstrates no evidence of opacification. Also no opacification is seen of the transverse sinuses or of the sigmoid sinuses bilaterally. Delayed hemodynamic flow was present in the tertiary branches of the anterior and the middle cerebral artery distribution with a delayed opacification of the anterior half of the superior sagittal sinus. The left common carotid arteriogram demonstrates the left external carotid artery and its major branches to be widely patent. The left internal carotid artery at the bulb to the cranial skull base is widely patent. The petrous, the cavernous and the supraclinoid left ICA are widely patent. Left middle cerebral artery and the left anterior cerebral artery opacify into the capillary and venous phases. Slow hemodynamic flow was seen into the venous phase and the  capillary phase. Retrograde opacification is seen within the cortical veins of the cerebral convexity. Retrograde opacification is seen of the inferior sagittal sinus, the internal cerebral veins, and the straight sinus into the torcula. Attempted endovascular revascularization of the occluded internal jugular veins, the sigmoid sinuses bilaterally, and of the transverse sinuses  bilaterally. Through the 088 Neuron Max sheath in the proximal right internal jugular vein, a combination of a 105 cm 071 Benchmark guide catheter with an 021 150 cm microcatheter was advanced over an 018 inch Aristotle micro guidewire with a moderate J configuration to the proximal right internal jugular vein. Attempts were then made to access the micro guidewire with the microcatheter, and of the intermediate catheter through the occluded jugular bulb and the proximal right sigmoid sinus without success. The 088 Neuron Max sheath was then advanced as described above and positioned in the mid to the proximal left internal jugular vein. Different combinations using the 105 Benchmark intermediate catheters and 4 French glide sheaths with different 035 inch guidewire, and 018 inch micro guidewires and 021 microcatheter were used to access the occluded proximal internal jugular vein and the sigmoid sinus and transverse sinuses without success. After multiple unsuccessful attempts, the procedure was stopped. A final control arteriogram performed through the 5 French catheter in the left common carotid artery demonstrated no evidence of intraluminal filling defects, or of occlusions on the arterial side. The egress of the venous blood flow remained unchanged compared to the initial left-sided common carotid arteriogram. An 8 French ExoSeal closure device was deployed for hemostasis at the left groin puncture site. Manual compression was held at the right common femoral venous puncture site for hemostasis. The patient's general anesthesia was reversed and the patient was extubated. Upon recovery, the patient was able to open and close his eyes. Has pupils were 2-3 mm equal bilaterally and sluggish reactive. Flat panel CT of the brain demonstrated venous congestion in both cerebral hemispheres. No evidence of intraparenchymal hemorrhage was evident. Patient was then returned to the floor in stable condition. IMPRESSION:  Status post unsuccessful revascularization of occluded internal jugular veins proximally, and of the sigmoid and transverse sinuses. No change in the occluded posterior half of the superior sagittal sinus. Prominent venous outflow through the inferior sagittal sinus internal cerebral veins, and the straight sinus to the torcula. PLAN: As per referring MD. Electronically Signed   By: Luellen Sages M.D.   On: 03/12/2024 07:55   IR ANGIO INTRA EXTRACRAN SEL COM CAROTID INNOMINATE BILAT MOD SED Result Date: 03/12/2024 CLINICAL DATA:  Decreased level of consciousness. Somnolent. Progression of dural venous thrombosis on CT venogram of the head. EXAM: BILATERAL COMMON CAROTID AND INNOMINATE ANGIOGRAPHY COMPARISON:  CT venogram and CT of the brain of 03/09/2024. MEDICATIONS: Heparin  2000 units IV; no antibiotic was administered within 1 hour of the procedure. ANESTHESIA/SEDATION: General anesthesia. CONTRAST:  Omnipaque  300 approximately 85 mL. FLUOROSCOPY TIME:  Fluoroscopy Time: 96 minutes 18 seconds (1478 mGy). COMPLICATIONS: None immediate. TECHNIQUE: Informed written consent was obtained from the patient after a thorough discussion of the procedural risks, benefits and alternatives. All questions were addressed. Maximal Sterile Barrier Technique was utilized including caps, mask, sterile gowns, sterile gloves, sterile drape, hand hygiene and skin antiseptic. A timeout was performed prior to the initiation of the procedure. The right groin was prepped and draped in the usual sterile fashion. Thereafter using modified Seldinger technique, transfemoral access into the left common femoral artery was obtained without difficulty. Over an 0.035 inch guidewire, a  5 French Pinnacle sheath was inserted. Through this, and also over an 0.035 inch guidewire, a 5 [place] JB 1 catheter was advanced to the aortic arch region and selectively positioned in the right common carotid artery and the left common carotid artery. Using  a similar technique, access into the right common femoral vein was obtained over an 0.035 inch guidewire and an 8 French short Pinnacle sheath. Over the 035 inch guidewire, a 5 [place] JB 1 catheter was then advanced through the inferior vena cava via the right atrium, and into the right internal jugular vein initially, and then into the left internal jugular vein. On the right side, a 088 80 cm Neuron Max sheath was advanced and positioned in the proximal right internal jugular vein. The guidewire was removed. Good aspiration obtained from the hub of the 8 [place] Neuron Max sheath which was then connected to continuous heparinized saline infusion. FINDINGS: The right common carotid arteriogram demonstrates the right external carotid artery and its major branches to be widely patent. The right internal carotid artery at the bulb to the cranial skull base is widely patent. The petrous, the cavernous and the supraclinoid right ICA are widely patent. Right posterior communicating artery is present. The right middle and the right anterior cerebral artery opacify into the capillary and venous phases. Cross filling with the anterior communicating artery of the left anterior cerebral artery A2 segment and distally is evident. Venous phase again demonstrates opacification of the proximal 2/3 of the superior sagittal sinus. The posterior half of the superior sagittal sinus demonstrates no evidence of opacification. Also no opacification is seen of the transverse sinuses or of the sigmoid sinuses bilaterally. Delayed hemodynamic flow was present in the tertiary branches of the anterior and the middle cerebral artery distribution with a delayed opacification of the anterior half of the superior sagittal sinus. The left common carotid arteriogram demonstrates the left external carotid artery and its major branches to be widely patent. The left internal carotid artery at the bulb to the cranial skull base is widely patent. The  petrous, the cavernous and the supraclinoid left ICA are widely patent. Left middle cerebral artery and the left anterior cerebral artery opacify into the capillary and venous phases. Slow hemodynamic flow was seen into the venous phase and the capillary phase. Retrograde opacification is seen within the cortical veins of the cerebral convexity. Retrograde opacification is seen of the inferior sagittal sinus, the internal cerebral veins, and the straight sinus into the torcula. Attempted endovascular revascularization of the occluded internal jugular veins, the sigmoid sinuses bilaterally, and of the transverse sinuses bilaterally. Through the 088 Neuron Max sheath in the proximal right internal jugular vein, a combination of a 105 cm 071 Benchmark guide catheter with an 021 150 cm microcatheter was advanced over an 018 inch Aristotle micro guidewire with a moderate J configuration to the proximal right internal jugular vein. Attempts were then made to access the micro guidewire with the microcatheter, and of the intermediate catheter through the occluded jugular bulb and the proximal right sigmoid sinus without success. The 088 Neuron Max sheath was then advanced as described above and positioned in the mid to the proximal left internal jugular vein. Different combinations using the 105 Benchmark intermediate catheters and 4 French glide sheaths with different 035 inch guidewire, and 018 inch micro guidewires and 021 microcatheter were used to access the occluded proximal internal jugular vein and the sigmoid sinus and transverse sinuses without success. After multiple unsuccessful attempts, the  procedure was stopped. A final control arteriogram performed through the 5 French catheter in the left common carotid artery demonstrated no evidence of intraluminal filling defects, or of occlusions on the arterial side. The egress of the venous blood flow remained unchanged compared to the initial left-sided common  carotid arteriogram. An 8 French ExoSeal closure device was deployed for hemostasis at the left groin puncture site. Manual compression was held at the right common femoral venous puncture site for hemostasis. The patient's general anesthesia was reversed and the patient was extubated. Upon recovery, the patient was able to open and close his eyes. Has pupils were 2-3 mm equal bilaterally and sluggish reactive. Flat panel CT of the brain demonstrated venous congestion in both cerebral hemispheres. No evidence of intraparenchymal hemorrhage was evident. Patient was then returned to the floor in stable condition. IMPRESSION: Status post unsuccessful revascularization of occluded internal jugular veins proximally, and of the sigmoid and transverse sinuses. No change in the occluded posterior half of the superior sagittal sinus. Prominent venous outflow through the inferior sagittal sinus internal cerebral veins, and the straight sinus to the torcula. PLAN: As per referring MD. Electronically Signed   By: Luellen Sages M.D.   On: 03/12/2024 07:55   IR THROMBECT VENO [facility] MOD SED Result Date: 03/12/2024 CLINICAL DATA:  Decreased level of consciousness. Somnolent. Progression of dural venous thrombosis on CT venogram of the head. EXAM: BILATERAL COMMON CAROTID AND INNOMINATE ANGIOGRAPHY COMPARISON:  CT venogram and CT of the brain of 03/09/2024. MEDICATIONS: Heparin  2000 units IV; no antibiotic was administered within 1 hour of the procedure. ANESTHESIA/SEDATION: General anesthesia. CONTRAST:  Omnipaque  300 approximately 85 mL. FLUOROSCOPY TIME:  Fluoroscopy Time: 96 minutes 18 seconds (1478 mGy). COMPLICATIONS: None immediate. TECHNIQUE: Informed written consent was obtained from the patient after a thorough discussion of the procedural risks, benefits and alternatives. All questions were addressed. Maximal Sterile Barrier Technique was utilized including caps, mask, sterile gowns, sterile gloves, sterile drape,  hand hygiene and skin antiseptic. A timeout was performed prior to the initiation of the procedure. The right groin was prepped and draped in the usual sterile fashion. Thereafter using modified Seldinger technique, transfemoral access into the left common femoral artery was obtained without difficulty. Over an 0.035 inch guidewire, a 5 French Pinnacle sheath was inserted. Through this, and also over an 0.035 inch guidewire, a 5 [place] JB 1 catheter was advanced to the aortic arch region and selectively positioned in the right common carotid artery and the left common carotid artery. Using a similar technique, access into the right common femoral vein was obtained over an 0.035 inch guidewire and an 8 French short Pinnacle sheath. Over the 035 inch guidewire, a 5 [place] JB 1 catheter was then advanced through the inferior vena cava via the right atrium, and into the right internal jugular vein initially, and then into the left internal jugular vein. On the right side, a 088 80 cm Neuron Max sheath was advanced and positioned in the proximal right internal jugular vein. The guidewire was removed. Good aspiration obtained from the hub of the 8 [place] Neuron Max sheath which was then connected to continuous heparinized saline infusion. FINDINGS: The right common carotid arteriogram demonstrates the right external carotid artery and its major branches to be widely patent. The right internal carotid artery at the bulb to the cranial skull base is widely patent. The petrous, the cavernous and the supraclinoid right ICA are widely patent. Right posterior communicating artery is present. The right  middle and the right anterior cerebral artery opacify into the capillary and venous phases. Cross filling with the anterior communicating artery of the left anterior cerebral artery A2 segment and distally is evident. Venous phase again demonstrates opacification of the proximal 2/3 of the superior sagittal sinus. The posterior  half of the superior sagittal sinus demonstrates no evidence of opacification. Also no opacification is seen of the transverse sinuses or of the sigmoid sinuses bilaterally. Delayed hemodynamic flow was present in the tertiary branches of the anterior and the middle cerebral artery distribution with a delayed opacification of the anterior half of the superior sagittal sinus. The left common carotid arteriogram demonstrates the left external carotid artery and its major branches to be widely patent. The left internal carotid artery at the bulb to the cranial skull base is widely patent. The petrous, the cavernous and the supraclinoid left ICA are widely patent. Left middle cerebral artery and the left anterior cerebral artery opacify into the capillary and venous phases. Slow hemodynamic flow was seen into the venous phase and the capillary phase. Retrograde opacification is seen within the cortical veins of the cerebral convexity. Retrograde opacification is seen of the inferior sagittal sinus, the internal cerebral veins, and the straight sinus into the torcula. Attempted endovascular revascularization of the occluded internal jugular veins, the sigmoid sinuses bilaterally, and of the transverse sinuses bilaterally. Through the 088 Neuron Max sheath in the proximal right internal jugular vein, a combination of a 105 cm 071 Benchmark guide catheter with an 021 150 cm microcatheter was advanced over an 018 inch Aristotle micro guidewire with a moderate J configuration to the proximal right internal jugular vein. Attempts were then made to access the micro guidewire with the microcatheter, and of the intermediate catheter through the occluded jugular bulb and the proximal right sigmoid sinus without success. The 088 Neuron Max sheath was then advanced as described above and positioned in the mid to the proximal left internal jugular vein. Different combinations using the 105 Benchmark intermediate catheters and 4  French glide sheaths with different 035 inch guidewire, and 018 inch micro guidewires and 021 microcatheter were used to access the occluded proximal internal jugular vein and the sigmoid sinus and transverse sinuses without success. After multiple unsuccessful attempts, the procedure was stopped. A final control arteriogram performed through the 5 French catheter in the left common carotid artery demonstrated no evidence of intraluminal filling defects, or of occlusions on the arterial side. The egress of the venous blood flow remained unchanged compared to the initial left-sided common carotid arteriogram. An 8 French ExoSeal closure device was deployed for hemostasis at the left groin puncture site. Manual compression was held at the right common femoral venous puncture site for hemostasis. The patient's general anesthesia was reversed and the patient was extubated. Upon recovery, the patient was able to open and close his eyes. Has pupils were 2-3 mm equal bilaterally and sluggish reactive. Flat panel CT of the brain demonstrated venous congestion in both cerebral hemispheres. No evidence of intraparenchymal hemorrhage was evident. Patient was then returned to the floor in stable condition. IMPRESSION: Status post unsuccessful revascularization of occluded internal jugular veins proximally, and of the sigmoid and transverse sinuses. No change in the occluded posterior half of the superior sagittal sinus. Prominent venous outflow through the inferior sagittal sinus internal cerebral veins, and the straight sinus to the torcula. PLAN: As per referring MD. Electronically Signed   By: Luellen Sages M.D.   On: 03/12/2024 07:55  IR CT Head Ltd Result Date: 03/12/2024 CLINICAL DATA:  Decreased level of consciousness. Somnolent. Progression of dural venous thrombosis on CT venogram of the head. EXAM: BILATERAL COMMON CAROTID AND INNOMINATE ANGIOGRAPHY COMPARISON:  CT venogram and CT of the brain of 03/09/2024.  MEDICATIONS: Heparin  2000 units IV; no antibiotic was administered within 1 hour of the procedure. ANESTHESIA/SEDATION: General anesthesia. CONTRAST:  Omnipaque  300 approximately 85 mL. FLUOROSCOPY TIME:  Fluoroscopy Time: 96 minutes 18 seconds (1478 mGy). COMPLICATIONS: None immediate. TECHNIQUE: Informed written consent was obtained from the patient after a thorough discussion of the procedural risks, benefits and alternatives. All questions were addressed. Maximal Sterile Barrier Technique was utilized including caps, mask, sterile gowns, sterile gloves, sterile drape, hand hygiene and skin antiseptic. A timeout was performed prior to the initiation of the procedure. The right groin was prepped and draped in the usual sterile fashion. Thereafter using modified Seldinger technique, transfemoral access into the left common femoral artery was obtained without difficulty. Over an 0.035 inch guidewire, a 5 French Pinnacle sheath was inserted. Through this, and also over an 0.035 inch guidewire, a 5 [place] JB 1 catheter was advanced to the aortic arch region and selectively positioned in the right common carotid artery and the left common carotid artery. Using a similar technique, access into the right common femoral vein was obtained over an 0.035 inch guidewire and an 8 French short Pinnacle sheath. Over the 035 inch guidewire, a 5 [place] JB 1 catheter was then advanced through the inferior vena cava via the right atrium, and into the right internal jugular vein initially, and then into the left internal jugular vein. On the right side, a 088 80 cm Neuron Max sheath was advanced and positioned in the proximal right internal jugular vein. The guidewire was removed. Good aspiration obtained from the hub of the 8 [place] Neuron Max sheath which was then connected to continuous heparinized saline infusion. FINDINGS: The right common carotid arteriogram demonstrates the right external carotid artery and its major branches  to be widely patent. The right internal carotid artery at the bulb to the cranial skull base is widely patent. The petrous, the cavernous and the supraclinoid right ICA are widely patent. Right posterior communicating artery is present. The right middle and the right anterior cerebral artery opacify into the capillary and venous phases. Cross filling with the anterior communicating artery of the left anterior cerebral artery A2 segment and distally is evident. Venous phase again demonstrates opacification of the proximal 2/3 of the superior sagittal sinus. The posterior half of the superior sagittal sinus demonstrates no evidence of opacification. Also no opacification is seen of the transverse sinuses or of the sigmoid sinuses bilaterally. Delayed hemodynamic flow was present in the tertiary branches of the anterior and the middle cerebral artery distribution with a delayed opacification of the anterior half of the superior sagittal sinus. The left common carotid arteriogram demonstrates the left external carotid artery and its major branches to be widely patent. The left internal carotid artery at the bulb to the cranial skull base is widely patent. The petrous, the cavernous and the supraclinoid left ICA are widely patent. Left middle cerebral artery and the left anterior cerebral artery opacify into the capillary and venous phases. Slow hemodynamic flow was seen into the venous phase and the capillary phase. Retrograde opacification is seen within the cortical veins of the cerebral convexity. Retrograde opacification is seen of the inferior sagittal sinus, the internal cerebral veins, and the straight sinus into the  torcula. Attempted endovascular revascularization of the occluded internal jugular veins, the sigmoid sinuses bilaterally, and of the transverse sinuses bilaterally. Through the 088 Neuron Max sheath in the proximal right internal jugular vein, a combination of a 105 cm 071 Benchmark guide catheter  with an 021 150 cm microcatheter was advanced over an 018 inch Aristotle micro guidewire with a moderate J configuration to the proximal right internal jugular vein. Attempts were then made to access the micro guidewire with the microcatheter, and of the intermediate catheter through the occluded jugular bulb and the proximal right sigmoid sinus without success. The 088 Neuron Max sheath was then advanced as described above and positioned in the mid to the proximal left internal jugular vein. Different combinations using the 105 Benchmark intermediate catheters and 4 French glide sheaths with different 035 inch guidewire, and 018 inch micro guidewires and 021 microcatheter were used to access the occluded proximal internal jugular vein and the sigmoid sinus and transverse sinuses without success. After multiple unsuccessful attempts, the procedure was stopped. A final control arteriogram performed through the 5 French catheter in the left common carotid artery demonstrated no evidence of intraluminal filling defects, or of occlusions on the arterial side. The egress of the venous blood flow remained unchanged compared to the initial left-sided common carotid arteriogram. An 8 French ExoSeal closure device was deployed for hemostasis at the left groin puncture site. Manual compression was held at the right common femoral venous puncture site for hemostasis. The patient's general anesthesia was reversed and the patient was extubated. Upon recovery, the patient was able to open and close his eyes. Has pupils were 2-3 mm equal bilaterally and sluggish reactive. Flat panel CT of the brain demonstrated venous congestion in both cerebral hemispheres. No evidence of intraparenchymal hemorrhage was evident. Patient was then returned to the floor in stable condition. IMPRESSION: Status post unsuccessful revascularization of occluded internal jugular veins proximally, and of the sigmoid and transverse sinuses. No change in the  occluded posterior half of the superior sagittal sinus. Prominent venous outflow through the inferior sagittal sinus internal cerebral veins, and the straight sinus to the torcula. PLAN: As per referring MD. Electronically Signed   By: Luellen Sages M.D.   On: 03/12/2024 07:55   US  EKG SITE RITE Result Date: 03/10/2024 If Site Rite image not attached, placement could not be confirmed due to current cardiac rhythm.  Overnight EEG with video Result Date: 03/10/2024 Arleene Lack, MD     03/11/2024  9:09 AM Patient Name: Aedon Percell MRN: [id number] Epilepsy Attending: Arleene Lack Referring Physician/Provider: Consuelo Denmark, MD Duration: 03/09/2024 2212 to 03/10/2024 2212  Patient history: 33yo M with new onset R sided weakness. EEG to evaluate for seizure  Level of alertness: Awake, asleep  AEDs during EEG study: TPM  Technical aspects: This EEG study was done with scalp electrodes positioned according to the 10-20 International system of electrode placement. Electrical activity was reviewed with band pass filter of 1-70Hz , sensitivity of 7 uV/mm, display speed of 60mm/sec with a 60Hz  notched filter applied as appropriate. EEG data were recorded continuously and digitally stored.  Video monitoring was available and reviewed as appropriate.  Description: The posterior dominant rhythm consists of 8 Hz activity of moderate voltage (25-35 uV) seen predominantly in posterior head regions, symmetric and reactive to eye opening and eye closing. EEG showed continuous generalized and lateralized left hemisphere 3 to 6 Hz theta-delta slowing. Sleep was characterized by vertex waves, sleep spindles (12-14hz ), maximal fronto-central  region. Hyperventilation and photic stimulation were not performed.    ABNORMALITY - Continuous slow, generalized  and lateralized left hemisphere  IMPRESSION: This study is suggestive of cortical dysfunction in left hemisphere likely secondary to underlying structural abnormality.  Additionally there is moderate diffuse encephalopathy. No seizures or epileptiform discharges were seen throughout the recording.  Arleene Lack    DG CHEST PORT 1 VIEW Result Date: 03/09/2024 EXAM: 1 VIEW(S) XRAY OF THE CHEST 03/09/2024 10:49:20 AM COMPARISON: None available. CLINICAL HISTORY: [id number] Weakness [id number]. Stroke, weakness; rover Stroke, weakness; rover FINDINGS: LUNGS AND PLEURA: No consolidation. No pulmonary edema. No pleural effusion. No pneumothorax. HEART AND MEDIASTINUM: No acute abnormality of the cardiac and mediastinal silhouettes. BONES AND SOFT TISSUES: No acute osseous abnormality. IMPRESSION: 1. No acute process. Electronically signed by: Karlyn Overman MD 03/09/2024 02:47 PM EDT RP Workstation: [id number]   CT HEAD WO CONTRAST ( ) Result Date: 03/09/2024 CLINICAL DATA:  Stroke.  Follow-up.  Dural venous sinus thrombosis. EXAM: CT HEAD WITHOUT CONTRAST TECHNIQUE: Contiguous axial images were obtained from the base of the skull through the vertex without intravenous contrast. RADIATION DOSE REDUCTION: This exam was performed according to the departmental dose-optimization program which includes automated exposure control, adjustment of the mA and/or kV according to patient size and/or use of iterative reconstruction technique. COMPARISON:  CT and MRI studies done yesterday. FINDINGS: Brain: Generalized brain swelling. Focal low density at the frontoparietal vertices, left more than right could relate to venous infarctions. Focal low-density in the left occipital lobe could be subtle evidence of some early venous ischemic changes in that region. No intraparenchymal hemorrhage. No hydrocephalus or extra-axial collection. Vascular: Some areas of persistent hyperdensity in the venous sinuses consistent with the known venous thrombotic disease. Skull: Negative Sinuses/Orbits: Clear/normal Other: None IMPRESSION: 1. Generalized brain swelling. Focal low density at the frontoparietal  vertices, left more than right, could relate to venous infarctions. Focal low-density in the left occipital lobe could be subtle evidence of some early venous ischemic changes in that region. No intraparenchymal hemorrhage. 2. Some areas of persistent hyperdensity in the venous sinuses consistent with the known venous thrombotic disease. Electronically Signed   By: Bettylou Brunner M.D.   On: 03/09/2024 14:10   EEG adult Result Date: 03/09/2024 Arleene Lack, MD     03/09/2024  8:35 AM Patient Name: Ziya Vereb MRN: [id number] Epilepsy Attending: Arleene Lack Referring Physician/Provider: Laymond Priestly, NP Date: 03/08/2024 Duration: 22.15 mins Patient history: 33yo M with new onset R sided weakness. EEG to evaluate for seizure Level of alertness: Awake AEDs during EEG study: TPM Technical aspects: This EEG study was done with scalp electrodes positioned according to the 10-20 International system of electrode placement. Electrical activity was reviewed with band pass filter of 1-70Hz , sensitivity of 7 uV/mm, display speed of 68mm/sec with a 60Hz  notched filter applied as appropriate. EEG data were recorded continuously and digitally stored.  Video monitoring was available and reviewed as appropriate. Description: The posterior dominant rhythm consists of 8 Hz activity of moderate voltage (25-35 uV) seen predominantly in posterior head regions, asymmetric ( left<right) and reactive to eye opening and eye closing. EEG showed continuous polymorphic 3 to 6 Hz theta-delta slowing in left hemisphere as well as intermittent generalized 3-5hz  theta-delta slowing.Hyperventilation and photic stimulation were not performed.   ABNORMALITY - Continuous slow, left hemisphere - Intermittent slow, generalized - Background asymmetry, left<right IMPRESSION: This study is suggestive of cortical dysfunction arising from left hemisphere likely secondary to underlying  structural abnormality. Additionally there is mild to moderate  diffuse encephalopathy. No seizures or epileptiform discharges were seen throughout the recording. Arleene Lack   MR BRAIN W WO CONTRAST Result Date: 03/09/2024 CLINICAL DATA:  Follow-up examination for dural venous sinus thrombosis. EXAM: MRI HEAD WITHOUT AND WITH CONTRAST TECHNIQUE: Multiplanar, multiecho pulse sequences of the brain and surrounding structures were obtained without and with intravenous contrast. CONTRAST:  7mL GADAVIST  GADOBUTROL  1 MMOL/ML IV SOLN COMPARISON:  Comparison made with CT venogram performed on the same day as well as multiple previous studies. FINDINGS: Brain: Cerebral volume within normal limits for age. Residual mild diffusion signal abnormality involving the parasagittal perirolandic regions again seen (series 5, images 96-88). Overall, appearance and distribution as compared to previous MRI from 03/02/2024 is not significantly changed or progressed. Mild associated T2/FLAIR signal intensity without ADC correlate. 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 Mild associated petechial blood hemorrhage at the posterior left frontal region (series 12, image 51), stable from prior. No frank hemorrhagic transformation or significant mass effect. Additional subtle diffusion signal abnormality involving the left occipital region (series 5, image 73), also likely evolving subtle venous ischemic changes, not significantly changed in retrospect. No other new areas of acute or evolving ischemia. No other acute intracranial hemorrhage. 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 Preponderance of clot present within the posterior aspect of the superior sagittal sinus, with additional clot in the left transverse and sigmoid sinuses. Subocclusive clot within the right transverse sinus. Probable cavernous sinus thromboses better seen on prior exam. No mass lesion or midline shift.  Stable ventricular size and morphology. No extra-axial fluid collection. Pituitary gland within normal limits. No other pathologic enhancement. Vascular: Persistent dural venous sinus thrombosis as above. Major intracranial arterial vascular flow voids are maintained. Skull and upper cervical spine: Cerebellar tonsils are low lying extending up to 9 mm below the foramen magnum, similar to prior. Bone marrow signal intensity within normal limits. No scalp soft tissue abnormality. Sinuses/Orbits: Subtle bulging of the optic discs at the posterior aspects of the globes, suggesting elevated intracranial pressures. Globes orbital soft tissues otherwise unremarkable. Mild mucosal thickening about the sphenoid ethmoidal sinuses. Paranasal sinuses are otherwise clear. Trace bilateral mastoid effusions, of doubtful significance. Other: None. IMPRESSION: 1. 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 2. Evolving subacute venous ischemic changes involving the paramedian frontal lobes and likely the left occipital lobe, not significantly changed as compared to previous. Mild associated petechial hemorrhage at the posterior left frontal region, stable from prior. No frank hemorrhagic transformation or significant mass effect. No new or progressive areas of ischemia. 3. Low-lying cerebellar tonsils extending up to 9 mm below the foramen magnum, similar to prior. Electronically Signed   By: Virgia Griffins M.D.   On: 03/09/2024 03:15   CT VENOGRAM HEAD Result Date: 03/08/2024 CLINICAL DATA:  Dural venous sinus thrombosis. Right-sided weakness. EXAM: CT VENOGRAM HEAD TECHNIQUE: Venographic phase images of the brain were obtained following the administration of intravenous contrast. Multiplanar reformats and maximum intensity projections were generated. RADIATION DOSE REDUCTION: This exam was performed according to the departmental  dose-optimization program which includes automated exposure control, adjustment of the mA and/or kV according to patient size and/or use of iterative reconstruction technique. CONTRAST:  OMNIPAQUE  IOHEXOL  350 MG/ML SOLN COMPARISON:  CT venogram 03/02/2024 FINDINGS: Extensive mixed occlusive and subocclusive thrombus in the  posterior aspect of the superior sagittal sinus extending to the torcula has mildly progressed from the prior CT venogram. Subocclusive thrombus in the transverse and sigmoid sinuses bilaterally is overall mildly improved. 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 The straight sinus, inferior sagittal sinus, vein of Galen, and internal cerebral veins are patent. Extensive venous collaterals are again noted. These results were called by telephone at the time of interpretation on 03/08/2024 at 2:26 p.m. to Dr. Consuelo Denmark, who verbally acknowledged these results. IMPRESSION: 1. Mild progression of extensive thrombus in the superior sagittal sinus posteriorly. 2. Mild improvement of subocclusive thrombus in the transverse and sigmoid sinuses bilaterally. 3. Persistent bilateral cavernous sinus thrombosis. Electronically Signed   By: Aundra Lee M.D.   On: 03/08/2024 15:59   CT ANGIO HEAD NECK W WO CM W PERF (CODE STROKE) Result Date: 03/08/2024 CLINICAL DATA:  Neuro deficit, acute, stroke suspected. Recent history of extensive dural venous sinus thrombosis. EXAM: CT ANGIOGRAPHY HEAD AND NECK CT PERFUSION BRAIN TECHNIQUE: Multidetector CT imaging of the head and neck was performed using the standard protocol during bolus administration of intravenous contrast. Multiplanar CT image reconstructions and MIPs were obtained to evaluate the vascular anatomy. Carotid stenosis measurements (when applicable) are obtained utilizing NASCET criteria, using the distal internal carotid diameter as the denominator. Multiphase CT imaging of the brain was performed following IV bolus contrast  injection. Subsequent parametric perfusion maps were calculated using RAPID software. RADIATION DOSE REDUCTION: This exam was performed according to the departmental dose-optimization program which includes automated exposure control, adjustment of the mA and/or kV according to patient size and/or use of iterative reconstruction technique. CONTRAST:  OMNIPAQUE  IOHEXOL  350 MG/ML SOLN COMPARISON:  CTA head and neck 02/01/2024.  MRI head 03/02/2024. FINDINGS: CTA NECK FINDINGS Aortic arch: Standard branching. Wide patency of the brachiocephalic and subclavian arteries. Right carotid system: Patent and smooth without evidence of stenosis or dissection. Left carotid system: Patent and smooth without evidence of stenosis or dissection. Vertebral arteries: Patent and smooth without evidence of stenosis or dissection. Strongly dominant left vertebral artery. Skeleton: No acute osseous abnormality or suspicious lesion. Other neck: No evidence of cervical lymphadenopathy or mass. Upper chest: No mass or consolidation in the included lung apices. Review of the MIP images confirms the above findings CTA HEAD FINDINGS Anterior circulation: The internal carotid arteries are widely patent from skull base to carotid termini. ACAs and MCAs are patent without evidence of a proximal branch occlusion or significant proximal stenosis. No aneurysm is identified. Posterior circulation: The intracranial vertebral arteries are patent with the left being strongly dominant. Patent PICA and SCA origins are visualized bilaterally. The basilar artery is widely patent. There are patent posterior communicating arteries bilaterally. Both PCAs are patent without evidence of a significant proximal stenosis. No aneurysm is identified. Venous sinuses: More fully evaluated on the separately reported dedicated CT venogram. Anatomic variants: None. Review of the MIP images confirms the above findings CT Brain Perfusion Findings: ASPECTS: 10 CBF  (<30%) Volume: 0mL Perfusion (Tmax>6.0s) volume: 21mL, located in the parasagittal frontal lobes bilaterally and in the left cerebellar hemisphere Mismatch Volume: 21mL Infarction Location:No acute core infarct identified by CTP. IMPRESSION: 1. No emergent arterial occlusion or significant stenosis the head and neck. 2. 21 mL of prolonged Tmax in the parasagittal frontal lobes and left cerebellar hemisphere with subacute venous ischemia shown in the frontal lobes on recent MRI. No acute core infarct identified by CT perfusion. Electronically Signed   By: Leighton Punches  Laurie Poplar M.D.   On: 03/08/2024 15:58   CT HEAD CODE STROKE WO CONTRAST Result Date: 03/08/2024 CLINICAL DATA:  Code stroke. Neuro deficit, acute, stroke suspected. Right-sided weakness. Recent history of extensive dural venous sinus thrombosis. EXAM: CT HEAD WITHOUT CONTRAST TECHNIQUE: Contiguous axial images were obtained from the base of the skull through the vertex without intravenous contrast. RADIATION DOSE REDUCTION: This exam was performed according to the departmental dose-optimization program which includes automated exposure control, adjustment of the mA and/or kV according to patient size and/or use of iterative reconstruction technique. COMPARISON:  Head CT, CT venogram, and MRI 03/02/2024 FINDINGS: Brain: There is unchanged mild edema in the parasagittal posterior left frontal lobe corresponding to a known subacute infarct. No definite new infarct, intracranial hemorrhage, mass, midline shift, or extra-axial fluid collection is identified. The ventricles are unchanged in size. 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 Vascular: Known extensive dural venous sinus thrombosis, including hyperdense appearance of the posterior aspect of the superior sagittal sinus extending to the torcula. Skull: No acute fracture or suspicious lesion. Sinuses/Orbits: Paranasal sinuses and mastoid air cells are clear. Unremarkable  orbits. Other: None. ASPECTS (Alberta Stroke Program Early CT Score) - Ganglionic level infarction (caudate, lentiform nuclei, internal capsule, insula, M1-M3 cortex): 7 - Supraganglionic infarction (M4-M6 cortex): 3 Total score (0-10 with 10 being normal): 10 These results were communicated to Dr. Christiane Cowing at 2:24 pm on 03/08/2024 by text page via the [facility] messaging system. IMPRESSION: 1. 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 2. No definite new infarct or intracranial hemorrhage. 3. ASPECTS of 10. Electronically Signed   By: Aundra Lee M.D.   On: 03/08/2024 14:24   MR Brain W and Wo Contrast Result Date: 03/02/2024 CLINICAL DATA:  Acute neurologic deficit EXAM: MRI HEAD WITHOUT AND WITH CONTRAST TECHNIQUE: Multiplanar, multiecho pulse sequences of the brain and surrounding structures were obtained without and with intravenous contrast. CONTRAST:  6mL GADAVIST  GADOBUTROL  1 MMOL/ML IV SOLN COMPARISON:  02/01/2024 FINDINGS: Brain: There small areas of abnormal diffusion weighted intensity within the high bilateral paramedian frontal lobes. No definite ADC correlate. Location is similar to findings from 02/01/2024. Areas of signal loss on susceptibility weighted imaging likely reflect engorged cortical veins. There is multifocal hyperintense T2-weighted signal within the white matter. Parenchymal volume and CSF spaces are normal. Vascular: Widespread dural venous sinus thrombosis is better characterized on the concomitant CT venogram. There is mixed occlusive and nonocclusive thrombosis of the superior sagittal sinus, left transverse sinus, left sigmoid sinus and right transverse sinus. 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 Arterial flow voids are normal. Skull and upper cervical spine: Normal calvarium and skull base. Visualized upper cervical spine and soft tissues are normal. Sinuses/Orbits:No paranasal sinus fluid levels or advanced mucosal  thickening. No mastoid or middle ear effusion. Normal orbits. IMPRESSION: 1. Small areas of abnormal diffusion weighted intensity within the high bilateral paramedian frontal lobes, similar to findings from 02/01/2024, consistent with subacute venous ischemia. 2. Widespread mixed occlusive and nonocclusive dural venous sinus thrombosis is better characterized on the concomitant CT venogram. Electronically Signed   By: Juanetta Nordmann M.D.   On: 03/02/2024 20:39   CT HEAD CODE STROKE WO CONTRAST Result Date: 03/02/2024 CLINICAL DATA:  Code stroke. Neuro deficit, acute, stroke suspected. Right-sided facial droop. Right-sided weakness. EXAM: CT VENOGRAM HEAD TECHNIQUE: Venographic phase images of the brain were obtained following the administration of intravenous contrast. Multiplanar reformats and maximum  intensity projections were generated. RADIATION DOSE REDUCTION: This exam was performed according to the departmental dose-optimization program which includes automated exposure control, adjustment of the mA and/or kV according to patient size and/or use of iterative reconstruction technique. CONTRAST:  75mL OMNIPAQUE  IOHEXOL  350 MG/ML SOLN COMPARISON:  Prior head CT examinations 02/02/2024 and earlier. Brain MRI 02/01/2024. CT angiogram head/neck 02/01/2024. CT venogram head 02/01/2024. FINDINGS: NON-CONTRAST HEAD CT FINDINGS: Brain: No age-advanced or lobar predominant cerebral atrophy. 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 Known subacute infarcts elsewhere within the bilateral medial frontal lobes, occult by CT and better appreciated on the prior brain MRI of 02/01/2024. Partially empty sella turcica. 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 There is no acute intracranial hemorrhage. No extra-axial  fluid collection. No evidence of an intracranial mass. No midline shift. Vascular: No hyperdense arterial vessel. Venous findings reported below. Skull: No calvarial fracture or aggressive osseous lesion. Sinuses/Orbits: No mass or acute finding within the imaged orbits. No significant paranasal sinus disease at the imaged levels. CT VENOGRAM HEAD FINDINGS: Residual/recurrent occlusive and subocclusive thrombi within the superior sagittal sinus posteriorly, extending to the confluence of sinuses. Thrombus extends into the medial aspect of the right transverse dural venous sinus. New from the CT venogram head of 02/01/2024, there is some reconstitution of enhancement within the right transverse dural venous sinus more laterally. Clot burden within the right sigmoid sinus has also decreased. Residual/recurrent subocclusive thrombus within the left transverse and sigmoid dural venous sinuses. 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 The straight sinus, inferior sagittal sinus and vein of Galen are patent. The internal cerebral veins are patent. (Series 4, image 128). Extensive venous collaterals again noted. These results were called by telephone at the time of interpretation on 03/02/2024 at 4:59 pm to provider Dr. Doretta Gant, who verbally acknowledged these results. IMPRESSION: Non-contrast head CT: 1. 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 Consider a brain MRI to assess for acute components. 2. Known subacute infarcts elsewhere within the medial frontal lobes bilaterally, occult by CT and better appreciated on the prior brain MRI of 02/01/2024. 3. Partially empty sella turcica. This finding can reflect incidental anatomic variation, or alternatively, it can be associated with chronic idiopathic intracranial hypertension (pseudotumor cerebri). 4. 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 CT venogram head: 1. Residual/recurrent occlusive and subocclusive thrombi within the superior sagittal sinus posteriorly, extending to the confluence of sinuses. 2. Thrombus extends into the medial aspect of the right transverse dural venous sinus. 3. New from the CT venogram head of 02/01/2024, there is some reconstitution of enhancement within the right transverse dural venous sinus more laterally. Clot burden within the right sigmoid sinus has also decreased. 4. Residual/recurrent subocclusive thrombus within the left transverse and sigmoid dural venous sinuses. 5. 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 6. The straight sinus, inferior sagittal sinus and vein of Galen are now patent. 7. Extensive venous collaterals again noted. Electronically Signed   By: Bascom Lily D.O.   On: 03/02/2024 17:00   CT VENOGRAM HEAD Result Date: 03/02/2024 CLINICAL DATA:  Code stroke. Neuro deficit, acute, stroke suspected. Right-sided facial droop. Right-sided weakness. EXAM: CT VENOGRAM HEAD TECHNIQUE: Venographic phase  images of the brain were obtained following the administration of intravenous contrast. Multiplanar reformats and maximum intensity projections were generated. RADIATION DOSE REDUCTION: This exam was performed according to the departmental dose-optimization program which includes automated exposure control, adjustment of the mA and/or kV according to patient size and/or use of iterative reconstruction technique. CONTRAST:  75mL OMNIPAQUE  IOHEXOL  350 MG/ML SOLN COMPARISON:  Prior head CT examinations 02/02/2024 and earlier. Brain MRI 02/01/2024. CT angiogram head/neck 02/01/2024. CT venogram head 02/01/2024. FINDINGS: NON-CONTRAST HEAD CT FINDINGS: Brain: No age-advanced or lobar predominant cerebral atrophy. A known venous infarct within the medial left frontal lobe has increased in extent since the head CT of 02/02/2024, now spanning 2.6 cm (for instance as seen on series 6,  image 35). Known subacute infarcts elsewhere within the bilateral medial frontal lobes, occult by CT and better appreciated on the prior brain MRI of 02/01/2024. Partially empty sella turcica. 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 There is no acute intracranial hemorrhage. No extra-axial fluid collection. No evidence of an intracranial mass. No midline shift. Vascular: No hyperdense arterial vessel. Venous findings reported below. Skull: No calvarial fracture or aggressive osseous lesion. Sinuses/Orbits: No mass or acute finding within the imaged orbits. No significant paranasal sinus disease at the imaged levels. CT VENOGRAM HEAD FINDINGS: Residual/recurrent occlusive and subocclusive thrombi within the superior sagittal sinus posteriorly, extending to the confluence of sinuses. Thrombus extends into the medial aspect of the right transverse dural venous sinus. New from the CT venogram head of 02/01/2024, there is some reconstitution of enhancement within the right transverse dural venous sinus more laterally. Clot burden within the right sigmoid sinus has also decreased. Residual/recurrent subocclusive thrombus within the left transverse and sigmoid dural venous sinuses. 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 The straight sinus, inferior sagittal sinus and vein of Galen are patent. The internal cerebral veins are patent. (Series 4, image 128). Extensive venous collaterals again noted. These results were called by telephone at the time of interpretation on 03/02/2024 at 4:59 pm to provider Dr. Doretta Gant, who verbally acknowledged these results. IMPRESSION: Non-contrast head CT: 1. 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 Consider a brain MRI to assess for acute components. 2. Known subacute infarcts elsewhere within the medial  frontal lobes bilaterally, occult by CT and better appreciated on the prior brain MRI of 02/01/2024. 3. Partially empty sella turcica. This finding can reflect incidental anatomic variation, or alternatively, it can be associated with chronic idiopathic intracranial hypertension (pseudotumor cerebri). 4. 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 CT venogram head: 1. Residual/recurrent occlusive and subocclusive thrombi within the superior sagittal sinus posteriorly, extending to the confluence of sinuses. 2. Thrombus extends into the medial aspect of the right transverse dural venous sinus. 3. New from the CT venogram head of 02/01/2024, there is some reconstitution of enhancement within the right transverse dural venous sinus more laterally. Clot burden within the right sigmoid sinus has also decreased. 4. Residual/recurrent subocclusive thrombus within the left transverse and sigmoid dural venous sinuses. 5. 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 6. The straight sinus, inferior sagittal sinus and vein of Galen are now patent. 7. Extensive venous collaterals again noted. Electronically Signed   By: Bascom Lily D.O.   On: 03/02/2024 17:00    Microbiology: Recent Results (from the past 240 hours)  Culture, blood (  Routine X 2) w Reflex to ID Panel     Status: None (Preliminary result)   Collection Time: 03/08/24  6:45 PM   Specimen: BLOOD LEFT HAND  Result Value Ref Range Status   Specimen Description BLOOD LEFT HAND  Final   Special Requests   Final    BOTTLES DRAWN AEROBIC ONLY Blood Culture results may not be optimal due to an inadequate volume of blood received in culture bottles   Culture  Setup Time   Final    GRAM POSITIVE RODS AEROBIC BOTTLE ONLY CRITICAL RESULT CALLED TO, READ BACK BY AND VERIFIED WITH: PHARMD ELIZABETH MARTIN ON 03/10/24 @ 1336 BY DRT    Culture   Final    GRAM POSITIVE RODS SENT TO [facility] FOR IDENTIFICATION Performed at  [facility], [address] [place], [place] [address]    Report Status PENDING  Incomplete  Culture, blood (Routine X 2) w Reflex to ID Panel     Status: None (Preliminary result)   Collection Time: 03/08/24  6:50 PM   Specimen: BLOOD LEFT HAND  Result Value Ref Range Status   Specimen Description BLOOD LEFT HAND  Final   Special Requests   Final    BOTTLES DRAWN AEROBIC ONLY Blood Culture results may not be optimal due to an inadequate volume of blood received in culture bottles   Culture  Setup Time   Final    GRAM POSITIVE RODS AEROBIC BOTTLE ONLY CRITICAL VALUE NOTED.  VALUE IS CONSISTENT WITH PREVIOUSLY REPORTED AND CALLED VALUE.    Culture   Final    GRAM POSITIVE RODS SENT TO [facility] FOR ID Performed at [facility], [address], [place], [place] [address]    Report Status PENDING  Incomplete  MRSA Next Gen by PCR, Nasal     Status: None   Collection Time: 03/09/24  2:12 PM   Specimen: Nasal Mucosa; Nasal Swab  Result Value Ref Range Status   MRSA by PCR Next Gen NOT DETECTED NOT DETECTED Final    Comment: (NOTE) The GeneXpert MRSA Assay (FDA approved for NASAL specimens only), is one component of a comprehensive MRSA colonization surveillance program. It is not intended to diagnose MRSA infection nor to guide or monitor treatment for MRSA infections. Test performance is not FDA approved in patients less than 35 years old. Performed at [facility], [address] [place], [place] [address]      Labs: Basic Metabolic Panel: Recent Labs  Lab 03/12/24 0631 03/12/24 1325 03/12/24 2345 03/13/24 0602 03/14/24 0415 03/15/24 0500 03/16/24 0528  NA 143   < > 139 139 138 139 139  K 3.7  --   --  3.3* 3.4* 3.5 3.1*  CL 117*  --   --  117* 114* 113* 111  CO2 18*  --   --  15* 16* 18* 20*  GLUCOSE 111*  --   --  112* 133* 117* 117*  BUN <5*  --   --  6 9 6 11   CREATININE 0.76  --   --  0.95 1.01 0.77 1.08  CALCIUM  9.2  --   --  9.5 9.1 9.2  9.5  MG 2.2  --   --   --   --   --   --   PHOS 3.3  --   --   --   --   --   --    < > = values in this interval  not displayed.   Liver Function Tests: No results for input(s): "AST", "ALT", "ALKPHOS", "BILITOT", "PROT", "ALBUMIN" in the last 168 hours. No results for input(s): "LIPASE", "AMYLASE" in the last 168 hours. No results for input(s): "AMMONIA" in the last 168 hours. CBC: Recent Labs  Lab 03/14/24 0415 03/15/24 0500 03/16/24 0528 03/17/24 0459 03/18/24 0551  WBC 9.0 12.0* 8.5 14.0* 8.2  HGB 13.2 13.5 13.4 13.2 12.2*  HCT 40.8 40.0 39.8 39.1 37.1*  MCV 81.9 80.2 79.9* 79.8* 81.2  PLT 248 255 244 232 228   Cardiac Enzymes: No results for input(s): "CKTOTAL", "CKMB", "CKMBINDEX", "TROPONINI" in the last 168 hours. BNP: BNP (last 3 results) No results for input(s): "BNP" in the last 8760 hours.  ProBNP (last 3 results) No results for input(s): "PROBNP" in the last 8760 hours.  CBG: Recent Labs  Lab 03/17/24 1155 03/17/24 1648 03/17/24 2114 03/18/24 0741 03/18/24 1216  GLUCAP 185* 164* 139* 110* 180*    Signed:  Verlie Glisson MD   [facility] 03/18/2024, 2:02 PM

## 2024-03-18 NOTE — Progress Notes (Signed)
 Pt arrived to [id number] at 1815 vitals and pt is alert. Wife is bedside.

## 2024-03-18 NOTE — TOC Transition Note (Signed)
 Transition of Care (TOC) - Discharge Note Sherin Dingwall RN,BSN Transitions of Care Unit 4NP (Non Trauma)- RN Case Manager See Treatment Team for direct Phone #   Patient Details  Name: Samuel Warren MRN: [id number] Date of Birth: 04-09-1991  Transition of Care [facility]) CM/SW Contact:  Rox Cope, RN Phone Number: 03/18/2024, 2:52 PM   Clinical Narrative:    Per CIR liaison pt has insurance auth and bed available for admit to [facility] INPT rehab today.  MD has cleared pt to transition to INPT rehab, pt/family agreeable to CIR transition   No further TOC needs noted.    Final next level of care: IP Rehab Facility Barriers to Discharge: Barriers Resolved   Patient Goals and CMS Choice Patient states their goals for this hospitalization and ongoing recovery are:: rehab then return home CMS Medicare.gov Compare Post Acute Care list provided to:: Patient        Discharge Placement               [facility] INPT rehab        Discharge Plan and Services Additional resources added to the After Visit Summary for   In-house Referral: NA Discharge Planning Services: NA Post Acute Care Choice: IP Rehab          DME Arranged: N/A DME Agency: NA       HH Arranged: NA HH Agency: NA        Social Drivers of Health (SDOH) Interventions SDOH Screenings   Food Insecurity: No Food Insecurity (03/08/2024)  Housing: Low Risk  (03/09/2024)  Transportation Needs: No Transportation Needs (03/08/2024)  Utilities: Not At Risk (03/08/2024)  Social Connections: Patient Declined (02/08/2024)  Tobacco Use: Low Risk  (03/09/2024)     Readmission Risk Interventions    03/18/2024    2:52 PM 01/29/2024   11:53 AM  Readmission Risk Prevention Plan  Post Dischage Appt  Complete  Medication Screening  Complete  Transportation Screening Complete Complete  HRI or Home Care Consult Complete   Medication Review Oceanographer) Complete

## 2024-03-18 NOTE — Progress Notes (Signed)
 PHARMACY - ANTICOAGULATION CONSULT NOTE  Pharmacy Consult:  Warfarin Indication: Persistent dural venous sinus thrombus  No Known Allergies  Patient Measurements: Height: 5\' 4"  (162.6 cm) Weight: 65 kg (143 lb 4.8 oz) IBW/kg (Calculated) : 59.2 HEPARIN  DW (KG): 65  Vital Signs: Temp: 98.5 F (36.9 C) (05/08 1218) Temp Source: Oral (05/08 1218) BP: 109/69 (05/08 1218) Pulse Rate: 86 (05/08 1218)  Labs: Recent Labs    03/16/24 0528 03/17/24 0459 03/18/24 0551  HGB 13.4 13.2 12.2*  HCT 39.8 39.1 37.1*  PLT 244 232 228  APTT 95* 40* 41*  LABPROT 30.8* 27.7* 25.6*  INR 2.9* 2.6* 2.3*  HEPARINUNFRC 0.64  --   --   CREATININE 1.08  --   --     Estimated Creatinine Clearance: 80.7 mL/min (by C-G formula based on SCr of 1.08 mg/dL).   Assessment: 89 YOM with history of sinus thrombosis on Eliquis  PTA. He returned with new onset of R-sided weakness and cerebral edema.  Patient was initially on IV heparin , then transitioned back to PTA Eliquis .  Pharmacy consulted to transition patient to IV heparin  bridge to Coumadin starting 5/3.  Off IV heparin  with therapeutic INR and currently on Coumadin only.  INR therapeutic; CBC stable; no bleeding reported Eating better  Goal of Therapy:  INR 2-3 Monitor platelets by anticoagulation protocol: Yes   Plan:   Coumadin 4mg  PO today Daily PT / INR  Jimi Giza D. Marikay Show, PharmD, BCPS, BCCCP 03/18/2024, 1:26 PM

## 2024-03-18 NOTE — Evaluation (Signed)
 Occupational Therapy Assessment and Plan  Patient Details  Name: Samuel Warren MRN: [id number] Date of Birth: 22-Feb-1991  OT Diagnosis: ataxia, hemiplegia affecting dominant side, and muscle weakness (generalized) Rehab Potential: Rehab Potential (ACUTE ONLY): Excellent ELOS: 14-17 days   Today's Date: 03/19/2024 OT Individual Time: 0930-1030 OT Individual Time Calculation (min): 60 min     Hospital Problem: Principal Problem:   Dural venous sinus thrombosis   Past Medical History:  Past Medical History:  Diagnosis Date   Deaf    Past Surgical History:  Past Surgical History:  Procedure Laterality Date   IR ANGIO INTRA EXTRACRAN SEL COM CAROTID INNOMINATE BILAT MOD SED  02/02/2024   IR ANGIO INTRA EXTRACRAN SEL COM CAROTID INNOMINATE BILAT MOD SED  03/09/2024   IR CT HEAD LTD  03/09/2024   IR THROMBECT VENO MECH MOD SED  02/02/2024   IR THROMBECT VENO MECH MOD SED  03/09/2024   IR VENO SAGITTAL SINUS  03/09/2024   IR VENO/JUGULAR LEFT  02/02/2024   RADIOLOGY WITH ANESTHESIA N/A 02/02/2024   Procedure: RADIOLOGY WITH ANESTHESIA;  Surgeon: Luellen Sages, MD;  Location: [facility] OR;  Service: Radiology;  Laterality: N/A;   RADIOLOGY WITH ANESTHESIA N/A 03/09/2024   Procedure: RADIOLOGY WITH ANESTHESIA;  Surgeon: Luellen Sages, MD;  Location: [facility] OR;  Service: Radiology;  Laterality: N/A;    Assessment & Plan Clinical Impression: Patient is a 33 y.o. year old male who presented to the ED with new onset R side weakness. PMH includes deaf, GERD, meningoencephalitis, and hyperlipidemia. Pt admitted with worsening mental status and persistent dural venous sinus thrombus. .  Patient transferred to CIR on 03/18/2024 .    Patient currently requires mod with basic self-care skills secondary to muscle weakness, decreased cardiorespiratoy endurance, impaired timing and sequencing, unbalanced muscle activation, ataxia, and decreased coordination, and decreased postural control and hemiplegia.  Prior  to hospitalization, patient could complete BADL with independent .  Patient will benefit from skilled intervention to increase independence with basic self-care skills prior to discharge home with care partner.  Anticipate patient will require 24 hour supervision and follow up outpatient.  OT - End of Session Endurance Deficit: Yes Endurance Deficit Description: rest breaks within BADL tasks OT Assessment Rehab Potential (ACUTE ONLY): Excellent OT Patient demonstrates impairments in the following area(s): Endurance;Motor;Perception;Safety OT Basic ADL's Functional Problem(s): Grooming;Bathing;Dressing;Toileting;Eating OT Transfers Functional Problem(s): Toilet;Tub/Shower OT Additional Impairment(s): Fuctional Use of Upper Extremity OT Plan OT Intensity: Minimum of 1-2 x/day, 45 to 90 minutes OT Frequency: 5 out of 7 days OT Duration/Estimated Length of Stay: 14-17 days OT Treatment/Interventions: Balance/vestibular training;Cognitive remediation/compensation;Community reintegration;Discharge planning;Disease mangement/prevention;DME/adaptive equipment instruction;Functional electrical stimulation;Functional mobility training;Neuromuscular re-education;Pain management;Patient/family education;Psychosocial support;Self Care/advanced ADL retraining;Skin care/wound managment;Splinting/orthotics;Therapeutic Activities;Therapeutic Exercise;UE/LE Strength taining/ROM;UE/LE Coordination activities;Visual/perceptual remediation/compensation;Wheelchair propulsion/positioning OT Self Feeding Anticipated Outcome(s): Supervision OT Basic Self-Care Anticipated Outcome(s): Supervision OT Toileting Anticipated Outcome(s): Supervision OT Bathroom Transfers Anticipated Outcome(s): Supervision OT Recommendation Patient destination: Home Follow Up Recommendations: Outpatient OT Equipment Recommended: To be determined   OT Evaluation Precautions/Restrictions  Precautions Precautions: Fall;Other  (comment) Recall of Precautions/Restrictions: Intact Precaution/Restrictions Comments: R inattention Restrictions Weight Bearing Restrictions Per Provider Order: No General   Vital Signs   Pain   Home Living/Prior Functioning Home Living Available Help at Discharge: Family, Available 24 hours/day Type of Home: House Home Access: Stairs to enter [employer] of Steps: 7 Entrance Stairs-Rails: Left, Right Home Layout: One level Bathroom Shower/Tub: Engineer, manufacturing systems: Standard Additional Comments: lives with parents and siblings, 8 people total. Brother  states since he went home after last admit had to have HHA of 2 - Per previous hospital admission, was working with [employer] and useing RW  Lives With: Family IADL History Type of Occupation: Worked at [employer] per chart review Prior Function Level of Independence: Independent with homemaking with ambulation, Independent with basic ADLs (Prior to initial hospital visits in March/April, was independent) Vision Vision Assessment?: Vision impaired- to be further tested in functional context Additional Comments: functionally eye alignment WFL, tracks appropriately, Perception  Perception: Impaired Perception-Other Comments: R inattention Praxis Praxis: WFL Cognition Cognition Overall Cognitive Status: Difficult to assess Arousal/Alertness: Awake/alert Orientation Level: Person;Place;Situation Person: Oriented Place: Oriented Situation: Oriented Attention: Focused;Sustained Focused Attention: Appears intact Sustained Attention: Appears intact Problem Solving: Appears intact (simple) Brief Interview for Mental Status (BIMS) Repetition of Three Words (First Attempt): 3 Temporal Orientation: Year: Correct Temporal Orientation: Month: Accurate within 5 days Temporal Orientation: Day: Correct Recall: "Sock": No, could not recall Recall: "Blue": Yes, no cue required Recall: "Bed": Yes, no cue  required BIMS Summary Score: 13 Sensation Sensation Light Touch: Appears Intact Coordination Gross Motor Movements are Fluid and Coordinated: No Fine Motor Movements are Fluid and Coordinated: No Coordination and Movement Description: R side coordination deficits Heel Shin Test: R-61, L- 55 Motor  Motor Motor: Hemiplegia Motor - Skilled Clinical Observations: R hemiplegia  Trunk/Postural Assessment     Balance Balance Balance Assessed: Yes Dynamic Sitting Balance Dynamic Sitting - Level of Assistance: 4: Min assist Static Standing Balance Static Standing - Level of Assistance: 4: Min assist;3: Mod assist Dynamic Standing Balance Dynamic Standing - Level of Assistance: 3: Mod assist Extremity/Trunk Assessment RUE Assessment General Strength Comments: 4/5    Care Tool Care Tool Self Care Eating   Eating Assist Level: Minimal Assistance - Patient > 75%    Oral Care    Oral Care Assist Level: Minimal Assistance - Patient > 75%    Bathing   Body parts bathed by patient: Chest;Abdomen;Front perineal area;Buttocks;Right upper leg;Left upper leg;Right lower leg;Left lower leg;Face;Right arm;Left arm     Assist Level: Moderate Assistance - Patient 50 - 74%    Upper Body Dressing(including orthotics)   What is the patient wearing?: Pull over shirt   Assist Level: Minimal Assistance - Patient > 75%    Lower Body Dressing (excluding footwear)   What is the patient wearing?: Pants Assist for lower body dressing: Maximal Assistance - Patient 25 - 49%    Putting on/Taking off footwear   What is the patient wearing?: Non-skid slipper socks Assist for footwear: Maximal Assistance - Patient 25 - 49%       Care Tool Toileting Toileting activity   Assist for toileting: Maximal Assistance - Patient 25 - 49%     Care Tool Bed Mobility Roll left and right activity   Roll left and right assist level: Minimal Assistance - Patient > 75%    Sit to lying activity         Lying to sitting on side of bed activity         Care Tool Transfers Sit to stand transfer   Sit to stand assist level: Moderate Assistance - Patient 50 - 74%    Chair/bed transfer   Chair/bed transfer assist level: Moderate Assistance - Patient 50 - 74%     Toilet transfer   Assist Level: Moderate Assistance - Patient 50 - 74%     Care Tool Cognition  Expression of Ideas and Wants Expression of Ideas and  Wants: 3. Some difficulty - exhibits some difficulty with expressing needs and ideas (e.g, some words or finishing thoughts) or speech is not clear (ASL)  Understanding Verbal and Non-Verbal Content Understanding Verbal and Non-Verbal Content: 3. Usually understands - understands most conversations, but misses some part/intent of message. Requires cues at times to understand (learning ASL)   Memory/Recall Ability Memory/Recall Ability : None of the above were recalled   Refer to Care Plan for Long Term Goals  SHORT TERM GOAL WEEK 1 OT Short Term Goal 1 (Week 1): Patient will complete LB dressing with min A OT Short Term Goal 2 (Week 1): Patient will complete toileting tasks with min A OT Short Term Goal 3 (Week 1): Patient will maintain dynamic standing balance iwth min A  Recommendations for other services: None    Skilled Therapeutic Intervention OT eval completed addressing rehab process, OT purpose, POC, ELOS, and goals.  ASL interpreter preset throughout session and used gestures to help communicate. Stand-pivot transfers with mod A overall. Mod A for BADL tasks including sponge bath and dressing tasks.See below for further details regarding BADL performance.   ADL ADL Eating: Minimal assistance Grooming: Moderate assistance;Minimal assistance Upper Body Bathing: Minimal assistance Lower Body Bathing: Moderate assistance Upper Body Dressing: Minimal assistance Lower Body Dressing: Moderate assistance Toileting: Moderate assistance Toilet Transfer: Moderate  assistance Tub/Shower Transfer: Moderate assistance Mobility  Bed Mobility Bed Mobility: Supine to Sit;Sit to Supine Supine to Sit: Minimal Assistance - Patient > 75% Sit to Supine: Minimal Assistance - Patient > 75% Transfers Sit to Stand: Moderate Assistance - Patient 50-74% Stand to Sit: Moderate Assistance - Patient 50-74%   Discharge Criteria: Patient will be discharged from OT if patient refuses treatment 3 consecutive times without medical reason, if treatment goals not met, if there is a change in medical status, if patient makes no progress towards goals or if patient is discharged from hospital.  The above assessment, treatment plan, treatment alternatives and goals were discussed and mutually agreed upon: by patient  Lethia Raveling 03/19/2024, 12:31 PM

## 2024-03-19 ENCOUNTER — Inpatient Hospital Stay (HOSPITAL_COMMUNITY)

## 2024-03-19 DIAGNOSIS — G08 Intracranial and intraspinal phlebitis and thrombophlebitis: Secondary | ICD-10-CM | POA: Diagnosis not present

## 2024-03-19 LAB — COMPREHENSIVE METABOLIC PANEL WITH GFR
ALT: 47 U/L — ABNORMAL HIGH (ref 0–44)
AST: 27 U/L (ref 15–41)
Albumin: 3.6 g/dL (ref 3.5–5.0)
Alkaline Phosphatase: 44 U/L (ref 38–126)
Anion gap: 8 (ref 5–15)
BUN: 14 mg/dL (ref 6–20)
CO2: 22 mmol/L (ref 22–32)
Calcium: 9.2 mg/dL (ref 8.9–10.3)
Chloride: 112 mmol/L — ABNORMAL HIGH (ref 98–111)
Creatinine, Ser: 0.96 mg/dL (ref 0.61–1.24)
GFR, Estimated: >60 mL/min (ref >60)
Glucose, Bld: 104 mg/dL — ABNORMAL HIGH (ref 70–99)
Potassium: 3.5 mmol/L (ref 3.5–5.1)
Sodium: 142 mmol/L (ref 135–145)
Total Bilirubin: 0.5 mg/dL (ref 0.0–1.2)
Total Protein: 6.6 g/dL (ref 6.5–8.1)

## 2024-03-19 LAB — CBC WITH DIFFERENTIAL/PLATELET
Abs Immature Granulocytes: 0.05 10*3/uL (ref 0.00–0.07)
Basophils Absolute: 0 10*3/uL (ref 0.0–0.1)
Basophils Relative: 0 %
Eosinophils Absolute: 0.2 10*3/uL (ref 0.0–0.5)
Eosinophils Relative: 3 %
HCT: 36 % — ABNORMAL LOW (ref 39.0–52.0)
Hemoglobin: 11.9 g/dL — ABNORMAL LOW (ref 13.0–17.0)
Immature Granulocytes: 1 %
Lymphocytes Relative: 26 %
Lymphs Abs: 1.8 10*3/uL (ref 0.7–4.0)
MCH: 26.8 pg (ref 26.0–34.0)
MCHC: 33.1 g/dL (ref 30.0–36.0)
MCV: 81.1 fL (ref 80.0–100.0)
Monocytes Absolute: 0.6 10*3/uL (ref 0.1–1.0)
Monocytes Relative: 8 %
Neutro Abs: 4.2 10*3/uL (ref 1.7–7.7)
Neutrophils Relative %: 62 %
Platelets: 223 10*3/uL (ref 150–400)
RBC: 4.44 MIL/uL (ref 4.22–5.81)
RDW: 12.9 % (ref 11.5–15.5)
WBC: 6.8 10*3/uL (ref 4.0–10.5)
nRBC: 0 % (ref 0.0–0.2)

## 2024-03-19 LAB — PROTIME-INR
INR: 3 — ABNORMAL HIGH (ref 0.8–1.2)
Prothrombin Time: 31.2 s — ABNORMAL HIGH (ref 11.4–15.2)

## 2024-03-19 LAB — CULTURE, BLOOD (ROUTINE X 2)

## 2024-03-19 LAB — BACTERIAL ORGANISM REFLEX

## 2024-03-19 MED ORDER — WARFARIN 0.5 MG HALF TABLET
0.5000 mg | ORAL_TABLET | Freq: Once | ORAL | Status: AC
  Administered 2024-03-19: 0.5 mg via ORAL
  Filled 2024-03-19: qty 1

## 2024-03-19 MED ORDER — IOHEXOL 350 MG/ML SOLN
75.0000 mL | Freq: Once | INTRAVENOUS | Status: AC | PRN
  Administered 2024-03-19: 75 mL via INTRAVENOUS

## 2024-03-19 NOTE — Evaluation (Signed)
 Physical Therapy Assessment and Plan  Patient Details  Name: Samuel Warren MRN: [id number] Date of Birth: [birth year]  PT Diagnosis: Abnormal posture, Abnormality of gait, Coordination disorder, Difficulty walking, Hemiparesis dominant, Muscle weakness, and Pain in head Rehab Potential: Fair ELOS: 14-17 days   Today's Date: 03/19/2024 PT Individual Time: 1300-1350 PT Individual Time Calculation (min): 50 min    Hospital Problem: Principal Problem:   Dural venous sinus thrombosis   Past Medical History:  Past Medical History:  Diagnosis Date   Deaf    Past Surgical History:  Past Surgical History:  Procedure Laterality Date   IR ANGIO INTRA EXTRACRAN SEL COM CAROTID INNOMINATE BILAT MOD SED  02/02/2024   IR ANGIO INTRA EXTRACRAN SEL COM CAROTID INNOMINATE BILAT MOD SED  03/09/2024   IR CT HEAD LTD  03/09/2024   IR THROMBECT VENO MECH MOD SED  02/02/2024   IR THROMBECT VENO MECH MOD SED  03/09/2024   IR VENO SAGITTAL SINUS  03/09/2024   IR VENO/JUGULAR LEFT  02/02/2024   RADIOLOGY WITH ANESTHESIA N/A 02/02/2024   Procedure: RADIOLOGY WITH ANESTHESIA;  Surgeon: Luellen Sages, MD;  Location: [facility] OR;  Service: Radiology;  Laterality: N/A;   RADIOLOGY WITH ANESTHESIA N/A 03/09/2024   Procedure: RADIOLOGY WITH ANESTHESIA;  Surgeon: Luellen Sages, MD;  Location: [facility] OR;  Service: Radiology;  Laterality: N/A;    Assessment & Plan Clinical Impression: Samuel Warren is a 33 year old right-handed male [place] speaking with history significant for deafness and uses some sign language, hyperlipidemia and GERD as well as CVST status post IR for thrombectomy 02/01/2024 as well as recent viral meningitis/history of CVA completing course of Doxy. Per chart review patient lives with & family. 1 level home 7 steps to entry. Independent prior to admission working at [employer] and he has been receiving outpatient therapies. Recent admission 03/02/2024 - 03/05/2024 for venous infarct within the  medial left frontal lobe and MRI consistent with subacute venous ischemia bilateral paramedian frontal lobes, widespread mixed occlusive and nonocclusive dural venous sinus. He was admitted to tab by neurology services for workup and discharged to home on Eliquis . Presented 03/08/2024 after a witnessed fall and remained on the floor until EMS was dispatched with noted right side weakness left gaze preference. Reportedly patient had 2 apneic spells with EMS and required transient BVM. CT/MRI showed persistent findings of extensive dural venous sinus thrombosis, better characterized on CT venogram performed the same day. Evolving subacute venous ischemic changes involving the paramedian frontal lobes and likely the left occipital lobe, not significantly changed as compared to previous. Mild associated petechial hemorrhage at the posterior left frontal region, stable from prior. No frank hemorrhagic transformation or significant mass effect. EEG negative for seizure. Most recent echocardiogram with ejection fraction of 45 to 50% the left ventricle demonstrating regional wall motion abnormality. Admission chemistries unremarkable except WBC 12,600, glucose 120, total CK of 262. Underwent bilateral common carotid arteriograms and internal jugular venograms left CFA approach and right common femoral vein approach that showed absent flow noted no posterior one third the superior sagittal sinus, the transverse sinuses of the sigmoid sinuses bilaterally including the internal jugular veins proximally. Multiple attempts at revascularization unsuccessful. No need for LP at this time given no sign of CNS infection especially with cerebral edema and low-lying cerebellar tonsils extending up to 9 mm below the foramen magnum. Neurology follow-up initially placed on IV heparin  as well as 3% saline with bolus and has been transitioned back to Eliquis  and changed to  Coumadin  03/17/2024 for persistent dural venous sinus thrombus.  Hospital course persistent bouts of headaches initially on Topamax  stopped due to low bicarb and transition to Medrol  Dosepak and Depacon . He is on a regular diet now. Therapy evaluations completed due to patient's decreased functional mobility was admitted for a comprehensive rehab program.   Patient currently requires mod with mobility secondary to muscle weakness and decreased coordination and decreased motor planning.  Prior to hospitalization, patient was independent  with mobility and lived with Family in a House home.  Home access is 7Level entry (per pt through ASL interpreter.).  Patient will benefit from skilled PT intervention to maximize safe functional mobility, minimize fall risk, and decrease caregiver burden for planned discharge home with 24 hour supervision.  Anticipate patient will benefit from follow up HH at discharge.  PT - End of Session Activity Tolerance: Tolerates 10 - 20 min activity with multiple rests Endurance Deficit: Yes Endurance Deficit Description: rest breaks, increased pain, unable to continue participation w/ eval and treat.  Nursing to give pain meds. PT Assessment Rehab Potential (ACUTE/IP ONLY): Fair PT Patient demonstrates impairments in the following area(s): Balance;Safety;Endurance;Pain;Motor PT Transfers Functional Problem(s): Bed Mobility;Bed to Chair;Car;Furniture PT Locomotion Functional Problem(s): Ambulation;Wheelchair Mobility;Stairs PT Plan PT Intensity: Minimum of 1-2 x/day ,45 to 90 minutes PT Frequency: 5 out of 7 days PT Duration Estimated Length of Stay: 14-17 days PT Treatment/Interventions: Ambulation/gait training;Community reintegration;Neuromuscular re-education;Stair training;UE/LE Strength taining/ROM;Wheelchair propulsion/positioning;UE/LE Coordination activities;Therapeutic Activities;Pain management;Discharge planning;Balance/vestibular training;Functional mobility training;Patient/family education;Therapeutic Exercise PT  Transfers Anticipated Outcome(s): supervision PT Locomotion Anticipated Outcome(s): supervision w/ LRAD PT Recommendation Follow Up Recommendations: Home health PT Patient destination: Home Equipment Recommended: To be determined Equipment Details: has RW   PT Evaluation Precautions/Restrictions Precautions Precautions: Fall;Other (comment) Precaution/Restrictions Comments: R inattention Restrictions Weight Bearing Restrictions Per Provider Order: No General Chart Reviewed: Yes PT Amount of Missed Time (min): 25 Minutes PT Missed Treatment Reason: Patient unwilling to participate;Pain Family/Caregiver Present: No Vital SignsTherapy Vitals Temp: 97.9 F (36.6 C) Temp Source: Oral Pulse Rate: 61 Resp: 16 BP: 105/76 Patient Position (if appropriate): Lying Oxygen Therapy SpO2: 100 % O2 Device: Room Air Pain Pain Assessment Pain Scale: Faces Pain Score: 6  Pain Type: Acute pain Pain Location: Head Pain Orientation: Posterior;Anterior Pain Descriptors / Indicators: Constant;Aching Pain Interference Pain Interference Pain Effect on Sleep: 3. Frequently Pain Interference with Therapy Activities: 3. Frequently Pain Interference with Day-to-Day Activities: 3. 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/Perception  Therapist, sports Impairment Details: Inattention/Neglect Perception-Other Comments: R inattention  Cognition Overall Cognitive Status: Difficult to assess Safety/Judgment: Impaired Sensation Sensation Light Touch:  Appears Intact Coordination Gross Motor Movements are Fluid and Coordinated: No Coordination and Movement Description: R side coordination deficits Motor  Motor Motor: Hemiplegia Motor - Skilled Clinical Observations: R hemiplegia   Trunk/Postural Assessment  Cervical Assessment Cervical Assessment: Exceptions to [facility] (holds head flexed 2/2 pain/feeling of dizziness.) Thoracic Assessment Thoracic Assessment: Within Functional Limits Lumbar Assessment Lumbar Assessment: Exceptions to [facility] (posterior pelvic tilt.) Postural Control Postural Control: Deficits on evaluation Righting Reactions: delayed to absent. Protective Responses: absent.  Balance Balance Balance Assessed: Yes Dynamic Sitting Balance Dynamic Sitting - Balance Support: Feet supported (  cues for foot flat, hands on knees. Pt retropulses) Dynamic Sitting - Level of Assistance: 4: Min assist Static Standing Balance Static Standing - Level of Assistance: 4: Min assist;3: Mod assist Dynamic Standing Balance Dynamic Standing - Level of Assistance: 3: Mod assist Extremity Assessment      RLE Assessment RLE Assessment: Exceptions to [facility] General Strength Comments: Decreased but unable to perform MMT 2/2 language difficulties. LLE Assessment LLE Assessment: Within Functional Limits General Strength Comments: grossly WFL but unable to perform MMT.  Care Tool Care Tool Bed Mobility Roll left and right activity   Roll left and right assist level: Contact Guard/Touching assist    Sit to lying activity   Sit to lying assist level: Contact Guard/Touching assist    Lying to sitting on side of bed activity   Lying to sitting on side of bed assist level: the ability to move from lying on the back to sitting on the side of the bed with no back support.: Minimal Assistance - Patient > 75%     Care Tool Transfers Sit to stand transfer   Sit to stand assist level: Moderate Assistance - Patient 50 - 74%    Chair/bed  transfer   Chair/bed transfer assist level: Moderate Assistance - Patient 50 - 74%    Car transfer   Car transfer assist level: Minimal Assistance - Patient > 75%      Care Tool Locomotion Ambulation   Assist level: Moderate Assistance - Patient 50 - 74% Assistive device: Walker-rolling Max distance: 35  Walk 10 feet activity   Assist level: Moderate Assistance - Patient - 50 - 74% Assistive device: Walker-rolling   Walk 50 feet with 2 turns activity Walk 50 feet with 2 turns activity did not occur: Safety/medical concerns      Walk 150 feet activity Walk 150 feet activity did not occur: Safety/medical concerns      Walk 10 feet on uneven surfaces activity Walk 10 feet on uneven surfaces activity did not occur: Safety/medical concerns      Stairs Stair activity did not occur: Safety/medical concerns        Walk up/down 1 step activity Walk up/down 1 step or curb (drop down) activity did not occur: Safety/medical concerns      Walk up/down 4 steps activity Walk up/down 4 steps activity did not occur: Safety/medical concerns      Walk up/down 12 steps activity Walk up/down 12 steps activity did not occur: Safety/medical concerns      Pick up small objects from floor Pick up small object from the floor (from standing position) activity did not occur: Safety/medical concerns      Wheelchair Is the patient using a wheelchair?: Yes Type of Wheelchair: Manual   Wheelchair assist level: Minimal Assistance - Patient > 75% Max wheelchair distance: 25  Wheel 50 feet with 2 turns activity   Assist Level: Moderate Assistance - Patient 50 - 74%  Wheel 150 feet activity   Assist Level: Dependent - Patient 0%    Refer to Care Plan for Long Term Goals  SHORT TERM GOAL WEEK 1 PT Short Term Goal 1 (Week 1): Pt will transfer sup to sit w/ supervision consistently. PT Short Term Goal 2 (Week 1): Pt will improve sitting balance w/ supervision x 5' PT Short Term Goal 3 (Week 1): Pt  will transfer sit to stand w/ min A consistently. PT Short Term Goal 4 (Week 1): Pt will amb w/ min A x 50'  Recommendations  for other services: None   Skilled Therapeutic Intervention Evaluation completed (see details above and below) with education on PT POC and goals and individual treatment initiated with focus on transfers, gait, NMR, strengthening, balance.  Pt presents supine in bed and agreeable to therapy through interpreter.  Pt transfers sup to sit w/ CGA only for final 1/3 of transfer.  Pt sitting at EOB w/ almost constant retro and lean to R.  Pt cued for foot flat on floor and hands on knees.  Pt raises head and states "wobbly".  Pt transfers sit to stand w/ mod A 2/2 impulsive transfer before PT can educate on sequencing and safety.  Once pt standing and given cues for foot placement, decreases assist to min A.  Pt amb w/ RW and mod A initially for weight shift and managing RW, pt improves to min A for gait and still needs A for walker management.  Pt requires tactile cues for posture.  Pt transfers car at SUV height w/ min A, but even w/ cueing and acting out sequence, pt still lifts leg to get into car.  Pt amb to w/c and states increased pain anterior and posterior head and refuses further rx.  Pt returned to room and amb w/ RW w/ mod A to bed, CGA for sit to supine.  Bed alarm on and all needs in reach,  Nurse notified of pain med needs.   Mobility Bed Mobility Bed Mobility: Supine to Sit;Sit to Supine Supine to Sit: Contact Guard/Touching assist Sit to Supine: Contact Guard/Touching assist Transfers Transfers: Sit to Stand;Stand to Sit;Stand Pivot Transfers Sit to Stand: Moderate Assistance - Patient 50-74% Stand to Sit: Moderate Assistance - Patient 50-74% Stand Pivot Transfers: Moderate Assistance - Patient 50 - 74% Stand Pivot Transfer Details: Verbal cues for precautions/safety;Verbal cues for sequencing;Verbal cues for technique;Other (comment) Stand Pivot Transfer  Details (indicate cue type and reason): visual cues for hand placements. Transfer (Assistive device): None Locomotion  Gait Ambulation: Yes Gait Assistance: Moderate Assistance - Patient 50-74% Gait Distance (Feet): 35 Feet Assistive device: Rolling walker Gait Assistance Details: Manual facilitation for weight shifting;Other (comment);Tactile cues for posture;Tactile cues for weight shifting (manual A for RW management.) Gait Assistance Details: Pt initially requires facilitation for weight shift, but then progresses to performance. Gait Gait: Yes Gait Pattern: Decreased step length - right;Decreased step length - left;Trunk flexed;Poor foot clearance - right Gait velocity: Decreased Stairs / Additional Locomotion Stairs: No Wheelchair Mobility Wheelchair Mobility: Yes Wheelchair Assistance: Minimal assistance - Patient >75% Wheelchair Propulsion: Both upper extremities Wheelchair Parts Management: Needs assistance Distance: 25   Discharge Criteria: Patient will be discharged from PT if patient refuses treatment 3 consecutive times without medical reason, if treatment goals not met, if there is a change in medical status, if patient makes no progress towards goals or if patient is discharged from hospital.  The above assessment, treatment plan, treatment alternatives and goals were discussed and mutually agreed upon: by patient  Raychell Holcomb P Lynnix Schoneman 03/19/2024, 4:25 PM

## 2024-03-19 NOTE — Progress Notes (Signed)
 Met with patient and patient's brother. Brother does not speak much [place] but nods when asked. Patient is deaf and does sign language. MD was in room and said patient c/o headache with abdominal pain. MD to get CT of the abdomen.Nurse made aware of the headache. Continue to follow along to provide educational needs to facilitate preparation for discharge.

## 2024-03-19 NOTE — Progress Notes (Signed)
 Inpatient Rehabilitation  Patient information reviewed and entered into eRehab system by [facility]. Karen Kays., CCC/SLP, PPS Coordinator.  Information including medical coding, functional ability and quality indicators will be reviewed and updated through discharge.

## 2024-03-19 NOTE — Progress Notes (Signed)
 Inpatient Rehabilitation Admission Medication Review by a Pharmacist  A complete drug regimen review was completed for this patient to identify any potential clinically significant medication issues.  High Risk Drug Classes Is patient taking? Indication by Medication  Antipsychotic Yes PRN chlorpromazine  - hiccoughs  Anticoagulant Yes Warfarin - venous sinus thrombosis  Antibiotic No   Opioid No   Antiplatelet No   Hypoglycemics/insulin No   Vasoactive Medication No   Chemotherapy No   Other Yes Atorvastatin  - hyperlipidemia Divalproex  DR - headache prophylaxis Pantoprazole  - GERD Sodium chloride  tablets - hyponatremia Multivitamin - supplement Miralax , senokot - laxatives  PRNs: Acetaminophen  - mild pain or temp >99.34F Maalox - indigestion Fioricet  - refractory headaches Ondansetron  - nausea     Type of Medication Issue Identified Description of Issue Recommendation(s)  Drug Interaction(s) (clinically significant)     Duplicate Therapy     Allergy     No Medication Administration End Date     Incorrect Dose     Additional Drug Therapy Needed     Significant med changes from prior encounter (inform family/care partners about these prior to discharge). Apixaban  changed to Warfarin.  Topiramate  discontinued.  New Divalproex  DR, NaCl tablets, PRN chlorpromazine .  Communicate changes with patient/family prior to discharge.  Other       Clinically significant medication issues were identified that warrant physician communication and completion of prescribed/recommended actions by midnight of the next day:  No  Pharmacist comments:  - anticipate sodium chloride  tablets will stop if sodium levels remain normal   Time spent performing this drug regimen review (minutes):  [address]  Adolphus Akin, [place] 03/19/2024 11:51 AM

## 2024-03-19 NOTE — Progress Notes (Signed)
 Inpatient Rehabilitation Care Coordinator Assessment and Plan Patient Details  Name: Samuel Warren MRN: [id number] Date of Birth: 12/09/1990  Today's Date: 03/19/2024  Hospital Problems: Principal Problem:   Dural venous sinus thrombosis  Past Medical History:  Past Medical History:  Diagnosis Date   Deaf    Past Surgical History:  Past Surgical History:  Procedure Laterality Date   IR ANGIO INTRA EXTRACRAN SEL COM CAROTID INNOMINATE BILAT MOD SED  02/02/2024   IR ANGIO INTRA EXTRACRAN SEL COM CAROTID INNOMINATE BILAT MOD SED  03/09/2024   IR CT HEAD LTD  03/09/2024   IR THROMBECT VENO MECH MOD SED  02/02/2024   IR THROMBECT VENO MECH MOD SED  03/09/2024   IR VENO SAGITTAL SINUS  03/09/2024   IR VENO/JUGULAR LEFT  02/02/2024   RADIOLOGY WITH ANESTHESIA N/A 02/02/2024   Procedure: RADIOLOGY WITH ANESTHESIA;  Surgeon: Luellen Sages, MD;  Location: [facility] OR;  Service: Radiology;  Laterality: N/A;   RADIOLOGY WITH ANESTHESIA N/A 03/09/2024   Procedure: RADIOLOGY WITH ANESTHESIA;  Surgeon: Luellen Sages, MD;  Location: [facility] OR;  Service: Radiology;  Laterality: N/A;   Social History:  reports that he has never smoked. He has never used smokeless tobacco. He reports that he does not currently use alcohol. He reports that he does not currently use drugs.  Family / Support Systems Marital Status: Married Patient Roles: Spouse, Parent, Other (Comment) (sibling) Spouse/Significant Other: Hlinh she needs interpretor does not speak English-is home with Children: 1 yo child at home Other Supports: Thoaih-brother [phone] EM-Dad [phone] Mom Anticipated Caregiver: Wife and family members there are 8 family members who live together Ability/Limitations of Caregiver: Wife doesnt work and other family members in and out Caregiver Availability: 24/7 Family Dynamics: Close knit family who hav epulled together to assist pt, he has been back and forth to the hospital multiple times since March  2025  Social History Preferred language: Sign Language Religion:  Cultural Background: From [place] speaks [place] but also deaf and needs sign language interpreter Education: very little Health Literacy - How often do you need to have someone help you when you read instructions, pamphlets, or other written material from your doctor or pharmacy?: Always Writes: Yes Employment Status: Employed Name of Employer: [employer] been a month Return to Work Plans: Unsure would nee d to be recovered and not need a assistive device to use Legal History/Current Legal Issues: NA Guardian/Conservator: None-according to MD pt is capable of making his own decisions while here   Abuse/Neglect Abuse/Neglect Assessment Can Be Completed: Yes Physical Abuse: Denies Verbal Abuse: Denies Sexual Abuse: Denies Exploitation of patient/patient's resources: Denies Self-Neglect: Denies  Patient response to: Social Isolation - How often do you feel lonely or isolated from those around you?: Never  Emotional Status Pt's affect, behavior and adjustment status: Pt is hoping he progresses and gets back to independent level. He has a rw and bsc but did not use them prior to admission. His family is very supportive and involved and will assist at DC Recent Psychosocial Issues: health issues and multiple hospitalizations since 12/2023 Psychiatric History: No issues-with all going on would benefit from seeing neuro-psych while here Substance Abuse History: NA  Patient / Family Perceptions, Expectations & Goals Pt/Family understanding of illness & functional limitations: Pt and brother have a basic understanding of his issues and health conditions. Both do talk with the MD's involved and feel undertand plan moving forward. Premorbid pt/family roles/activities: husband, father, employee, sibling, son., friend etc Anticipated changes  in roles/activities/participation: resume Pt/family expectations/goals: Pt states: " I  hope to do good here."  Brother states: " We will help him."  [employer]: Other (Comment) (Going to OP [address]) Premorbid Home Care/DME Agencies: Other (Comment) (rw and bsc) Transportation available at discharge: family Is the patient able to respond to transportation needs?: Yes In the past 12 months, has lack of transportation kept you from medical appointments or from getting medications?: No In the past 12 months, has lack of transportation kept you from meetings, work, or from getting things needed for daily living?: No Resource referrals recommended: Neuropsychology  Discharge Planning Living Arrangements: Spouse/significant other, Children, Parent, Other relatives Support Systems: Spouse/significant other, Parent, Other relatives, Friends/neighbors Type of Residence: Private residence Insurance Resources: Media planner (specify) Education officer, museum) Financial Resources: Employment, Garment/textile technologist Screen Referred: No Living Expenses: Psychologist, sport and exercise Management: Patient, Family Does the patient have any problems obtaining your medications?: No Home Management: family Patient/Family Preliminary Plans: Return home with fmailh who he lives with along wiht wife and daughter. He may need 24/7 care at discharge and his wife can provide this. There is a language barrier and he signs to brother and he interpretors. Care Coordinator Barriers to Discharge: Insurance for SNF coverage, Other (comments) Care Coordinator Barriers to Discharge Comments: language barrier Care Coordinator Anticipated Follow Up Needs: HH/OP  Clinical Impression Family can provide assist and were prior to admission Pt has been back and forth to the hospital multiple times in the past several months. Will await team's evaluations and work on discharge needs.  Mardell Shade 03/19/2024, 1:57 PM

## 2024-03-19 NOTE — Plan of Care (Signed)
  Problem: RH Balance Goal: LTG: Patient will maintain dynamic sitting balance (OT) Description: LTG:  Patient will maintain dynamic sitting balance with assistance during activities of daily living (OT) Flowsheets (Taken 03/19/2024 1210) LTG: Pt will maintain dynamic sitting balance during ADLs with: Supervision/Verbal cueing Goal: LTG Patient will maintain dynamic standing with ADLs (OT) Description: LTG:  Patient will maintain dynamic standing balance with assist during activities of daily living (OT)  Flowsheets (Taken 03/19/2024 1210) LTG: Pt will maintain dynamic standing balance during ADLs with: Supervision/Verbal cueing   Problem: Sit to Stand Goal: LTG:  Patient will perform sit to stand in prep for activites of daily living with assistance level (OT) Description: LTG:  Patient will perform sit to stand in prep for activites of daily living with assistance level (OT) Flowsheets (Taken 03/19/2024 1210) LTG: PT will perform sit to stand in prep for activites of daily living with assistance level: Supervision/Verbal cueing   Problem: RH Grooming Goal: LTG Patient will perform grooming w/assist,cues/equip (OT) Description: LTG: Patient will perform grooming with assist, with/without cues using equipment (OT) Flowsheets (Taken 03/19/2024 1210) LTG: Pt will perform grooming with assistance level of: Supervision/Verbal cueing   Problem: RH Bathing Goal: LTG Patient will bathe all body parts with assist levels (OT) Description: LTG: Patient will bathe all body parts with assist levels (OT) Flowsheets (Taken 03/19/2024 1210) LTG: Pt will perform bathing with assistance level/cueing: Supervision/Verbal cueing   Problem: RH Dressing Goal: LTG Patient will perform upper body dressing (OT) Description: LTG Patient will perform upper body dressing with assist, with/without cues (OT). Flowsheets (Taken 03/19/2024 1210) LTG: Pt will perform upper body dressing with assistance level of:  Supervision/Verbal cueing Goal: LTG Patient will perform lower body dressing w/assist (OT) Description: LTG: Patient will perform lower body dressing with assist, with/without cues in positioning using equipment (OT) Flowsheets (Taken 03/19/2024 1210) LTG: Pt will perform lower body dressing with assistance level of: Supervision/Verbal cueing   Problem: RH Toileting Goal: LTG Patient will perform toileting task (3/3 steps) with assistance level (OT) Description: LTG: Patient will perform toileting task (3/3 steps) with assistance level (OT)  Flowsheets (Taken 03/19/2024 1210) LTG: Pt will perform toileting task (3/3 steps) with assistance level: Supervision/Verbal cueing   Problem: RH Toilet Transfers Goal: LTG Patient will perform toilet transfers w/assist (OT) Description: LTG: Patient will perform toilet transfers with assist, with/without cues using equipment (OT) Flowsheets (Taken 03/19/2024 1210) LTG: Pt will perform toilet transfers with assistance level of: Supervision/Verbal cueing   Problem: RH Tub/Shower Transfers Goal: LTG Patient will perform tub/shower transfers w/assist (OT) Description: LTG: Patient will perform tub/shower transfers with assist, with/without cues using equipment (OT) Flowsheets (Taken 03/19/2024 1210) LTG: Pt will perform tub/shower stall transfers with assistance level of: Supervision/Verbal cueing

## 2024-03-19 NOTE — Plan of Care (Signed)
  Problem: RH Expression Communication Goal: LTG Patient will express needs/wants via multi-modal(SLP) Description: LTG:  Patient will express needs/wants via multi-modal communication (gestures/written, etc) with cues (SLP) Flowsheets (Taken 03/19/2024 1151) LTG: Patient will express needs/wants via multimodal communication (gestures/written, etc) with cueing (SLP): Modified Independent

## 2024-03-19 NOTE — Progress Notes (Signed)
 PROGRESS NOTE   Subjective/Complaints:  Pt deaf and Speaks/understands Samuel Warren, however no interpretor in room this AM.   Is learning ASL- american sign language, so used ASL to communicate with pt.  Having significant Abd pain- constant- Having BM 5/7 hasn't helped- LBM large evening of 5/7  KUB done yesterday shows nothing, last CMP 4/30 was (-) for LFT elevation, etc, labs pending this AM.   Pt also c/o milder HA this AM- after Fioricet  last night.  Slept ok- fair.    Objective:   DG Abd 1 View Result Date: 03/18/2024 CLINICAL DATA:  Nausea EXAM: ABDOMEN - 1 VIEW COMPARISON:  None Available. FINDINGS: Scattered large and small bowel gas is noted. No free air is seen. No obstructive changes are noted. No bony abnormality is seen. IMPRESSION: No acute abnormality noted. Electronically Signed   By: [name]   On: 03/18/2024 21:14   Recent Labs    03/17/24 0459 03/18/24 0551  WBC 14.0* 8.2  HGB 13.2 12.2*  HCT 39.1 37.1*  PLT 232 228   No results for input(s): "NA", "K", "CL", "CO2", "GLUCOSE", "BUN", "CREATININE", "CALCIUM " in the last 72 hours.  Intake/Output Summary (Last 24 hours) at 03/19/2024 0814 Last data filed at 03/18/2024 2300 Gross per 24 hour  Intake --  Output 300 ml  Net -300 ml        Physical Exam: Vital Signs Blood pressure 102/72, pulse (!) 57, temperature 98.6 F (37 C), temperature source Oral, resp. rate 18, SpO2 98%.   General: awake, alert, appropriate, deaf, signing ASL some; NAD HENT: conjugate gaze; oropharynx moist CV: regular rhythm, and slightly bradycardic rate; no JVD Pulmonary: CTA B/L; no W/R/R- good air movement GI: soft, but TTP throughout- diffuse- no rebound it appears- no distension; slightly hypoactive BS Psychiatric: appropriate- slightly flat affect, but trying to interact Neurological: alert Musculoskeletal:        General: Normal range of motion.     Cervical  back: Neck supple. No tenderness.     Comments:  Basically 5-5/5 in all 4 extremities- but hard to test all muscles due to deafness and language barrier  Skin:    General: Skin is warm and dry.     Comments:  IV out of neck is gone- wounds healing  Neurological:     Mental Status: He is alert. He is disoriented.     Comments: [name] makes eye contact with examiner.  Exam limited by language barrier.  Follows simple demonstrated commands.  His brother who was in the room does help to translate. Mild ataxia Mild R inattention Mild ot moderate searching pattern on B/L finger to nose   Assessment/Plan: 1. Functional deficits which require 3+ hours per day of interdisciplinary therapy in a comprehensive inpatient rehab setting. Physiatrist is providing close team supervision and 24 hour management of active medical problems listed below. Physiatrist and rehab team continue to assess barriers to discharge/monitor patient progress toward functional and medical goals  Care Tool:  Bathing              Bathing assist       Upper Body Dressing/Undressing Upper body dressing  Upper body assist      Lower Body Dressing/Undressing Lower body dressing            Lower body assist       Toileting Toileting    Toileting assist       Transfers Chair/bed transfer  Transfers assist           Locomotion Ambulation   Ambulation assist              Walk 10 feet activity   Assist           Walk 50 feet activity   Assist           Walk 150 feet activity   Assist           Walk 10 feet on uneven surface  activity   Assist           Wheelchair     Assist               Wheelchair 50 feet with 2 turns activity    Assist            Wheelchair 150 feet activity     Assist          Blood pressure 102/72, pulse (!) 57, temperature 98.6 F (37 C), temperature source Oral, resp. rate 18, SpO2  98%.  Medical Problem List and Plan: 1. Functional deficits secondary to persistent dural venous sinus thrombosis             -patient may  shower             -ELOS/Goals: min A- 18-21 days             Admit to CIR  First day of evaluations- con't CIR PT, OT and SLP  Needs communication board- asked Nursing and SLP to address 2.  Antithrombotics: -DVT/anticoagulation:  Pharmaceutical: Coumadin  5/9- Last INR 2.3- managed by pharmacy             -antiplatelet therapy: N/A 3. Pain Management: Fioricet  as needed for refractory headaches, Tylenol  as needed  5/9- having HA's- they attempted Topamax - might need to try Elavil- if doesn't improve by tomorrow, will add Elavil 25 mg QHS 4. Mood/Behavior/Sleep: Provide emotional support             -antipsychotic agents: N/A 5. Neuropsych/cognition: This patient is capable of making decisions on his own behalf. 6. Skin/Wound Care: Routine skin checks 7. Fluids/Electrolytes/Nutrition: Routine in and outs with follow-up chemistries 8.  Cerebral edema.  Hypertonic saline discontinued 9.  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  Status post IR for thrombectomy was discharged on Eliquis - now changed to Coumadin  10.  History of stroke/recent viral meningitis.  Completed course of Doxy 11.  Refractory headaches.  Medrol  Dosepak.  Topamax  discontinued due to low bicarb- might need to start Elavil, since HA's still 6/10  5/9- still having constant HA's 12.  Hyperlipidemia.  Lipitor  13.  Congenital Deafness.  Patient does use some sign language- but mainly gestures per ASL interpretor 14.  GERD.  Protonix   15. Abd pain  5/9- pt clear having constant abd pain- will get CT of abd/pelvis with contrast to assess abd pain    I spent a total of 51   minutes on total care today- >50% coordination of care- due to  Review of chart, notes; labs when they came back- CT of abd/pelvis and prolonged time speaking with pt due to communication barrier- brother  does NOT speak much english- unfortunately- also d/w nursing and nursing coordinator about communication board and plan.     LOS: 1 days A FACE TO FACE EVALUATION WAS PERFORMED  [name] 03/19/2024, 8:14 AM

## 2024-03-19 NOTE — Evaluation (Signed)
 Speech Language Pathology Assessment and Plan  Patient Details  Name: Samuel Warren MRN: [id number] Date of Birth: 24-Dec-1990  SLP Diagnosis: Speech and Language deficits  Rehab Potential: Excellent ELOS: 18-21 days    Today's Date: 03/19/2024 SLP Individual Time: 1100-1130 SLP Individual Time Calculation (min): 30 min   Hospital Problem: Principal Problem:   Dural venous sinus thrombosis  Past Medical History:  Past Medical History:  Diagnosis Date   Deaf    Past Surgical History:  Past Surgical History:  Procedure Laterality Date   IR ANGIO INTRA EXTRACRAN SEL COM CAROTID INNOMINATE BILAT MOD SED  02/02/2024   IR ANGIO INTRA EXTRACRAN SEL COM CAROTID INNOMINATE BILAT MOD SED  03/09/2024   IR CT HEAD LTD  03/09/2024   IR THROMBECT VENO MECH MOD SED  02/02/2024   IR THROMBECT VENO MECH MOD SED  03/09/2024   IR VENO SAGITTAL SINUS  03/09/2024   IR VENO/JUGULAR LEFT  02/02/2024   RADIOLOGY WITH ANESTHESIA N/A 02/02/2024   Procedure: RADIOLOGY WITH ANESTHESIA;  Surgeon: Luellen Sages, MD;  Location: [facility] OR;  Service: Radiology;  Laterality: N/A;   RADIOLOGY WITH ANESTHESIA N/A 03/09/2024   Procedure: RADIOLOGY WITH ANESTHESIA;  Surgeon: Luellen Sages, MD;  Location: [facility] OR;  Service: Radiology;  Laterality: N/A;    Assessment / Plan / Recommendation Clinical Impression HPI: Pt is a 33 year old right-handed male with PMH significant for deafness (uses some sign language-more gestures- -not ASL), hyperlipidemia and GERD as well 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  He was admitted at that time by neurology services for workup and discharged to home on  Eliquis .  Presented 03/08/2024 after a witnessed fall and remained on the floor until EMS was dispatched with noted right side weakness left gaze preference.  Reportedly patient had 2 apneic spells with EMS and required transient BVM.  CT/MRI showed persistent findings of extensive dural venous sinus thrombosis, better characterized on CT venogram performed the same day.  Evolving subacute venous ischemic changes involving the paramedian frontal lobes and likely the left occipital lobe, not significantly changed as compared to previous.  Mild associated petechial hemorrhage at the posterior left frontal region, stable from prior.  No frank hemorrhagic transformation or significant mass effect.  EEG negative for seizure.  Most recent echocardiogram with ejection fraction of 45 to 50% the left ventricle demonstrating regional wall motion abnormality.  Admission chemistries unremarkable except WBC 12,600, glucose 120, total CK of 262.  Underwent bilateral common carotid arteriograms and internal jugular venograms left CFA approach and right common femoral vein approach that showed absent flow noted no posterior one third the superior sagittal sinus, the transverse sinuses of the sigmoid sinuses bilaterally including the internal jugular veins proximally.  Multiple attempts at revascularization unsuccessful.  No need for LP at this time given no sign of CNS infection especially with cerebral edema and low-lying cerebellar tonsils extending up to 9 mm below the foramen magnum.  Neurology follow-up initially placed on IV heparin  as well as 3% saline with bolus and has been transitioned back to Eliquis  and changed to Coumadin  03/17/2024 for persistent dural venous sinus thrombus.  Hospital course persistent bouts of headaches initially on Topamax  stopped due to low bicarb and transition  to Medrol  Dosepak and Depacon .  He is on a regular diet.  Therapy evaluations completed and pt was recommended for a comprehensive rehab  program.   Clinical Impression:  Bedside Swallow Evaluation: A bedside swallow evaluation was completed to assess for s/sx of oropharyngeal dysphagia. Oral mechanism exam WFL. POs administered included thin liquids via cup and straw, purees and solids. Patient with timely mastication and complete oral clearance. No s/sx of aspiration present. Recommend regular/thin diet with use of standardized precautions including sitting upright during PO and taking small bites/sips at a slow rate. No further ST needs regarding swallowing.  Communication: Patient demonstrates seemingly functional abilities to communicate using a mixture of gestures and picture communication board provided by SLP. Once SLP reviewed communication board, patient could identify specified objects given sign from ASL interpreter. Given communication board was just provided today, recommend SLP continues to follow patient to ensure he is able to communicate wants/needs to staff when no interpreter is present.  Cognition: Cognitive abilities were difficult to assess as patient is deaf and in the process of learning ASL (patients family speaks [place]). Patient was attentive throughout the entire evaluation and was able to recall how to contact nurse if needed (pressing call bell). Given ASL interpreter was only present for 30 minutes, recommend continued cognitive evaluation as communication abilities improve (patient learns more signs). Pt would benefit from skilled ST services to maximize communication in order to maximize functional independence at d/c.    Skilled Therapeutic Interventions          Patient evaluated using a non standardized cognitive linguistic assessment and bedside swallow evaluation to assess current cognitive, communicative and swallowing function. See above for details.    SLP Assessment  Patient will need skilled Speech Lanaguage Pathology Services during CIR admission    Recommendations  SLP Diet Recommendations:  Age appropriate regular solids;Thin Liquid Administration via: Cup;Straw Medication Administration: Whole meds with liquid Supervision: Patient able to self feed;Intermittent supervision to cue for compensatory strategies Compensations: Slow rate;Small sips/bites;Minimize environmental distractions Postural Changes and/or Swallow Maneuvers: Seated upright 90 degrees Oral Care Recommendations: Oral care BID Patient destination: Home Follow up Recommendations: None Equipment Recommended: None recommended by SLP    SLP Frequency 1 to 3 out of 7 days   SLP Duration  SLP Intensity  SLP Treatment/Interventions 18-21 days  Minumum of 1-2 x/day, 30 to 90 minutes  Cueing hierarchy;Functional tasks;Internal/external aids;Multimodal communication approach;Patient/family education;Speech/Language facilitation;Therapeutic Activities    Pain None reported   SLP Evaluation Cognition Overall Cognitive Status: Difficult to assess Arousal/Alertness: Awake/alert Orientation Level: Other (comment) (deaf, learning ASL) Attention: Focused;Sustained Focused Attention: Appears intact Sustained Attention: Appears intact Problem Solving: Appears intact (simple)  Comprehension Auditory Comprehension Overall Auditory Comprehension: Appears within functional limits for tasks assessed Commands: Within Functional Limits (with visual cues) Conversation: Simple Other Conversation Comments: appears functional with use of gestures/communication board Expression Expression Primary Mode of Expression: Nonverbal - gestures Verbal Expression Overall Verbal Expression:  (no verbal expression - deaf, learning ASL) Oral Motor Oral Motor/Sensory Function Overall Oral Motor/Sensory Function: Within functional limits Motor Speech Overall Motor Speech:  (no verbal output - deaf, learning ASL)  Care Tool Care Tool Cognition Ability to hear (with hearing aid or hearing appliances if normally used Ability to  hear (with hearing aid or hearing appliances if normally used): 3. Highly impaired - absence of useful hearing (deaf)   Expression of Ideas and Wants Expression of Ideas and Wants: 3. Some difficulty - exhibits some difficulty with  expressing needs and ideas (e.g, some words or finishing thoughts) or speech is not clear (ASL)   Understanding Verbal and Non-Verbal Content Understanding Verbal and Non-Verbal Content: 3. Usually understands - understands most conversations, but misses some part/intent of message. Requires cues at times to understand (learning ASL)  Memory/Recall Ability Memory/Recall Ability : None of the above were recalled    Bedside Swallowing Assessment General Diet Prior to this Study: Regular;Thin liquids (Level 0) Respiratory Status: Room air Behavior/Cognition: Alert;Cooperative;Pleasant mood Oral Cavity - Dentition: Adequate natural dentition Self-Feeding Abilities: Able to feed self Vision: Functional for self-feeding Patient Positioning: Upright in bed Volitional Swallow: Able to elicit  Ice Chips Ice chips: Not tested Thin Liquid Thin Liquid: Within functional limits Presentation: Cup;Self Fed;Straw Nectar Thick Nectar Thick Liquid: Not tested Honey Thick Honey Thick Liquid: Not tested Puree Puree: Within functional limits Presentation: Self Fed;Spoon Solid Solid: Within functional limits Presentation: Self Fed BSE Assessment Risk for Aspiration Impact on safety and function: Mild aspiration risk Other Related Risk Factors: History of dysphagia;History of GERD;Cognitive impairment;Previous CVA;Deconditioning  Short Term Goals: Week 1: SLP Short Term Goal 1 (Week 1): Patient will utilize picture communication board to communicate wants/needs to staff given mod i multimodal A  Refer to Care Plan for Long Term Goals  Recommendations for other services: None   Discharge Criteria: Patient will be discharged from SLP if patient refuses treatment 3  consecutive times without medical reason, if treatment goals not met, if there is a change in medical status, if patient makes no progress towards goals or if patient is discharged from hospital.  The above assessment, treatment plan, treatment alternatives and goals were discussed and mutually agreed upon: by patient  Killian Ress M.A., CCC-SLP 03/19/2024, 11:51 AM

## 2024-03-19 NOTE — Progress Notes (Signed)
 [facility]patient [facility] Individual Statement of Services  Patient Name:  Samuel Warren  Date:  03/19/2024  Welcome to the [facility]patient [facility].  Our goal is to provide you with an individualized program based on your diagnosis and situation, designed to meet your specific needs.  With this comprehensive rehabilitation program, you will be expected to participate in at least 3 hours of rehabilitation therapies Monday-Friday, with modified therapy programming on the weekends.  Your rehabilitation program will include the following services:  Physical Therapy (PT), Occupational Therapy (OT), Speech Therapy (ST), 24 hour per day rehabilitation nursing, Neuropsychology, Care Coordinator, Rehabilitation Medicine, Nutrition Services, and Pharmacy Services  Weekly team conferences will be held on Tuesday to discuss your progress.  Your Inpatient Rehabilitation Care Coordinator will talk with you frequently to get your input and to update you on team discussions.  Team conferences with you and your family in attendance may also be held.  Expected length of stay: 14-17 days  Overall anticipated outcome: supervision with cues  Depending on your progress and recovery, your program may change. Your Inpatient Rehabilitation Care Coordinator will coordinate services and will keep you informed of any changes. Your Inpatient Rehabilitation Care Coordinator's name and contact numbers are listed  below.  The following services may also be recommended but are not provided by the Inpatient Rehabilitation Center:   Home Health Rehabiltiation Services Outpatient Rehabilitation Services Vocational Rehabilitation   Arrangements will be made to provide these services after discharge if needed.  Arrangements include referral to agencies that provide these services.  Your insurance has been verified to be:  [employer] Your primary doctor is:  [facility]site geologist  Pertinent information will be shared with your  doctor and your insurance company.  Inpatient Rehabilitation Care Coordinator:  Adrianna Albee, [place] [phone] or Justine Oms  Information discussed with and copy given to patient by: Mardell Shade, 03/19/2024, 1:59 PM

## 2024-03-19 NOTE — Progress Notes (Signed)
 PHARMACY - ANTICOAGULATION CONSULT NOTE  Pharmacy Consult for Warfarin Indication:  central venous sinus thrombosis  No Known Allergies  Patient Measurements: Height: 5\' 4"  (162.6 cm) Weight: 65.3 kg (143 lb 15.4 oz) IBW/kg (Calculated) : 59.2 HEPARIN  DW (KG): 65.3  Vital Signs: Temp: 98.6 F (37 C) (05/09 0513) Temp Source: Oral (05/09 0513) BP: 102/72 (05/09 0513) Pulse Rate: 57 (05/09 0513)  Labs: Recent Labs    03/17/24 0459 03/18/24 0551 03/19/24 0740  HGB 13.2 12.2* 11.9*  HCT 39.1 37.1* 36.0*  PLT 232 228 223  APTT 40* 41*  --   LABPROT 27.7* 25.6* 31.2*  INR 2.6* 2.3* 3.0*  CREATININE  --   --  0.96    Estimated Creatinine Clearance: 90.8 mL/min (by C-G formula based on SCr of 0.96 mg/dL).  Assessment: 14 YOM with history of central venous sinus thrombosis on Eliquis  PTA. He returned with new onset of R-sided weakness and cerebral edema.  Patient was initially on IV heparin , then transitioned back to PTA Eliquis .  Pharmacy consulted to transition patient to IV heparin  bridge to Warfarin starting 5/3. IV heparin  stopped on 5/6 with therapeutic INR and currently on Warfarin only.   INR upper range therapeutic (3.0). Up from INR 2.3 on 5/8 and Warfarin 4 mg given. May trend up more tomorrow.  Warfarin dose held 5/6 when INR trended 2.0>2.9, then INR trended down 5/7 and 5/8.    Goal of Therapy:  INR 2-3 Monitor platelets by anticoagulation protocol: Yes   Plan:  Warfarin 0.5 mg x 1 today. Daily PT/INR Next CBC on 5/12 then intermittently. Monitor for signs/symptoms of bleeding.  Adolphus Akin, RPh 03/19/2024,12:12 PM

## 2024-03-20 ENCOUNTER — Inpatient Hospital Stay (HOSPITAL_COMMUNITY)

## 2024-03-20 DIAGNOSIS — G08 Intracranial and intraspinal phlebitis and thrombophlebitis: Secondary | ICD-10-CM | POA: Diagnosis not present

## 2024-03-20 LAB — PROTIME-INR
INR: 2.9 — ABNORMAL HIGH (ref 0.8–1.2)
Prothrombin Time: 30.3 s — ABNORMAL HIGH (ref 11.4–15.2)

## 2024-03-20 MED ORDER — METHOCARBAMOL 750 MG PO TABS
750.0000 mg | ORAL_TABLET | Freq: Four times a day (QID) | ORAL | Status: DC | PRN
  Administered 2024-03-20 – 2024-03-29 (×5): 750 mg via ORAL
  Filled 2024-03-20 (×6): qty 1

## 2024-03-20 MED ORDER — LIDOCAINE 5 % EX PTCH
2.0000 | MEDICATED_PATCH | CUTANEOUS | Status: DC
  Administered 2024-03-20 – 2024-04-06 (×17): 2 via TRANSDERMAL
  Filled 2024-03-20 (×19): qty 2

## 2024-03-20 MED ORDER — BUTALBITAL-APAP-CAFFEINE 50-325-40 MG PO TABS
1.0000 | ORAL_TABLET | ORAL | Status: DC | PRN
  Administered 2024-03-20 – 2024-03-24 (×6): 1 via ORAL
  Filled 2024-03-20 (×6): qty 1

## 2024-03-20 MED ORDER — WARFARIN 0.5 MG HALF TABLET
0.5000 mg | ORAL_TABLET | Freq: Once | ORAL | Status: AC
  Administered 2024-03-20: 0.5 mg via ORAL
  Filled 2024-03-20: qty 1

## 2024-03-20 MED ORDER — AMITRIPTYLINE HCL 25 MG PO TABS
25.0000 mg | ORAL_TABLET | Freq: Every day | ORAL | Status: DC
  Administered 2024-03-20 – 2024-03-25 (×6): 25 mg via ORAL
  Filled 2024-03-20 (×6): qty 1

## 2024-03-20 MED ORDER — SORBITOL 70 % SOLN: 30.0000 mL | Freq: Every day | Status: DC | PRN

## 2024-03-20 NOTE — Progress Notes (Signed)
 PHARMACY - ANTICOAGULATION CONSULT NOTE  Pharmacy Consult for Warfarin Indication:  central venous sinus thrombosis  No Known Allergies  Patient Measurements: Height: 5\' 4"  (162.6 cm) Weight: 65.3 kg (143 lb 15.4 oz) IBW/kg (Calculated) : 59.2 HEPARIN  DW (KG): 65.3  Vital Signs: Temp: 98.1 F (36.7 C) (05/10 0405) Temp Source: Oral (05/10 0405) BP: 113/71 (05/10 0405) Pulse Rate: 54 (05/10 0405)  Labs: Recent Labs    03/18/24 0551 03/19/24 0740  HGB 12.2* 11.9*  HCT 37.1* 36.0*  PLT 228 223  APTT 41*  --   LABPROT 25.6* 31.2*  INR 2.3* 3.0*  CREATININE  --  0.96    Estimated Creatinine Clearance: 90.8 mL/min (by C-G formula based on SCr of 0.96 mg/dL).  Assessment: 67 YOM with history of central venous sinus thrombosis on Eliquis  PTA. He returned with new onset of R-sided weakness and cerebral edema.  Patient was initially on IV heparin , then transitioned back to PTA Eliquis .  Pharmacy consulted to transition patient to IV heparin  bridge to Warfarin starting 5/3. IV heparin  stopped on 5/6 with therapeutic INR and currently on Warfarin only.   INR upper range therapeutic (3.0). Up from INR 2.3 on 5/8 and Warfarin 4 mg given. May trend up more tomorrow.  Warfarin dose held 5/6 when INR trended 2.0>2.9, then INR trended down 5/7 and 5/8.    5/10 AM update: INR 2.9  Goal of Therapy:  INR 2-3 Monitor platelets by anticoagulation protocol: Yes   Plan:  Warfarin 0.5 mg x 1 today. Daily PT/INR Monitor for signs/symptoms of bleeding.  Marleta Simmer BS, PharmD, BCPS Clinical Pharmacist 03/20/2024 9:21 AM  Contact: [phone] after 3 PM  "Be curious, not judgmental..." -Rumalda Counter

## 2024-03-20 NOTE — Progress Notes (Signed)
 Occupational Therapy  Note  Patient Details  Name: Samuel Warren MRN: [id number] Date of Birth: [birth year]  Today's Date: 03/20/2024 OT Missed Time: 60 Minutes Missed Time Reason: CT/MRI Nursing communicated with OT that pt was going for a stat CT. Pt unable to participate in scheduled OT session.     Carollee Circle, OTR/L,CBIS  Supplemental OT - [facility] Secure Chat Preferred   03/20/2024, 10:47 AM

## 2024-03-20 NOTE — Progress Notes (Signed)
 PROGRESS NOTE   Subjective/Complaints: Pt reports initially still has HA- at ~ 10 am, had 10/10 HA after Fioricet  earlier.    Keeps pointing to abd, but focuses mainly on HA. Ordered Sorbitol as needed for constipation.    Was up with family yesterday when wasn't supposed to be- per nursing.     Objective:   CT HEAD WO CONTRAST ( ) Result Date: 03/20/2024 CLINICAL DATA:  Dural venous sinus thrombosis suspected has dural sinus thrombosis but HA's have spiked- up to 10/10- so cocnerned there s achange in thrombosis EXAM: CT HEAD WITHOUT CONTRAST TECHNIQUE: Contiguous axial images were obtained from the base of the skull through the vertex without intravenous contrast. RADIATION DOSE REDUCTION: This exam was performed according to the departmental dose-optimization program which includes automated exposure control, adjustment of the mA and/or kV according to patient size and/or use of iterative reconstruction technique. COMPARISON:  None Available. FINDINGS: Brain: Known areas of subacute infarct were better characterized on prior MRI. No evidence of new/interval acute large vascular territory infarct or acute hemorrhage. No evidence of hydrocephalus or mass lesion. Similar low lying tonsils. Partially empty sella. Vascular: Redemonstrated areas of hyperdensity within the dural venous sinuses, compatible with known dural venous sinus thrombosis. Skull: No acute fracture. Sinuses/Orbits: Clear sinuses.  No acute orbital findings. IMPRESSION: 1. No significant change in comparison to May 5 CT head. Known areas of subacute infarct were better characterized on MRI. No evidence of new/interval acute large vascular territory infarct or acute hemorrhage. 2. Redemonstrated areas of hyperdensity within the dural venous sinuses, compatible with known dural venous sinus thrombosis that was better characterized on prior CTV and MRI. 3. Similar low lying  cerebellar tonsils. Electronically Signed   By: Stevenson Elbe M.D.   On: 03/20/2024 11:38   CT ABDOMEN PELVIS W WO CONTRAST Result Date: 03/20/2024 CLINICAL DATA:  Constant abdominal pain. Previous dural venous thrombosis. EXAM: CT ABDOMEN AND PELVIS WITHOUT AND WITH CONTRAST TECHNIQUE: Multidetector CT imaging of the abdomen and pelvis was performed following the standard protocol before and following the bolus administration of intravenous contrast. RADIATION DOSE REDUCTION: This exam was performed according to the departmental dose-optimization program which includes automated exposure control, adjustment of the mA and/or kV according to patient size and/or use of iterative reconstruction technique. CONTRAST:  75mL OMNIPAQUE  IOHEXOL  350 MG/ML SOLN COMPARISON:  X-ray 03/18/2024. FINDINGS: Lower chest: Mild ground-glass along the dependent lungs with some atelectasis. Trace pleural fluid. Hepatobiliary: Small area of focal fat deposition seen liver adjacent to the falciform ligament. Segment 4. No other space-occupying liver lesion identified at this time. Gallbladder is nondilated. Patent portal vein. Pancreas: Unremarkable. No pancreatic ductal dilatation or surrounding inflammatory changes. Spleen: Normal in size without focal abnormality. Adrenals/Urinary Tract: Adrenal glands are unremarkable. Kidneys are normal, without renal calculi, focal lesion, or hydronephrosis. Bladder has a preserved contour. There is a small area of slight bladder wall thickening anterior in the midline uncertain etiology and significance. Stomach/Bowel: Large bowel has a normal course and caliber. Scattered air-fluid levels identified along a: Particularly the transverse colon and some in the descending colon. Normal appendix in the right lower quadrant posteroinferior to the cecum. Mild debris  and fluid in the stomach. Small bowel is nondilated. Vascular/Lymphatic: No significant vascular findings are present. No enlarged  abdominal or pelvic lymph nodes. Reproductive: Prostate is unremarkable. Other: No free air or free fluid identified. Motion throughout the examination limiting evaluation. Musculoskeletal: Scattered Schmorl's nodes. Mild endplate osteophytes in the lumbar spine. IMPRESSION: No bowel obstruction, free air or free fluid. There are a few loops of colon with some air-fluid levels, nonspecific. Normal appendix. Small area of mild bladder wall thickening along the anterior aspect of bladder in the midline of uncertain etiology and significance. Correlate with symptoms. Motion Electronically Signed   By: Adrianna Horde M.D.   On: 03/20/2024 10:15   DG Abd 1 View Result Date: 03/18/2024 CLINICAL DATA:  Nausea EXAM: ABDOMEN - 1 VIEW COMPARISON:  None Available. FINDINGS: Scattered large and small bowel gas is noted. No free air is seen. No obstructive changes are noted. No bony abnormality is seen. IMPRESSION: No acute abnormality noted. Electronically Signed   By: [name]   On: 03/18/2024 21:14   Recent Labs    03/18/24 0551 03/19/24 0740  WBC 8.2 6.8  HGB 12.2* 11.9*  HCT 37.1* 36.0*  PLT 228 223   Recent Labs    03/19/24 0740  NA 142  K 3.5  CL 112*  CO2 22  GLUCOSE 104*  BUN 14  CREATININE 0.96  CALCIUM  9.2    Intake/Output Summary (Last 24 hours) at 03/20/2024 1533 Last data filed at 03/20/2024 0300 Gross per 24 hour  Intake 180 ml  Output 725 ml  Net -545 ml        Physical Exam: Vital Signs Blood pressure 112/82, pulse 66, temperature 98.1 F (36.7 C), temperature source Oral, resp. rate 20, height 5\' 4"  (1.626 m), weight 65.3 kg, SpO2 98%.    General: awake, alert, appropriate, deaf-c hronic- supine in bed; and also up in w/c; OT and ASL interpretor in room 3rd time; mother in room all 3 times; NAD HENT: conjugate gaze; oropharynx moist CV: regular rate and rhythm; no JVD Pulmonary: CTA B/L; no W/R/R- good air movement GI: soft, NT, ND, (+)BS Psychiatric:  appropriate- flat affect Neurological: alert- signing some MSK- neck tightness severe in upper traps, splenius capitus and levators and scalenes Musculoskeletal:        General: Normal range of motion.     Cervical back: Neck supple. No tenderness.     Comments:  Basically 5-5/5 in all 4 extremities- but hard to test all muscles due to deafness and language barrier  Skin:    General: Skin is warm and dry.     Comments:  IV out of neck is gone- wounds healing  Neurological:     Mental Status: He is alert. He is disoriented.     Comments: [name] makes eye contact with examiner.  Exam limited by language barrier.  Follows simple demonstrated commands.  His brother who was in the room does help to translate. Mild ataxia Mild R inattention Mild ot moderate searching pattern on B/L finger to nose   Assessment/Plan: 1. Functional deficits which require 3+ hours per day of interdisciplinary therapy in a comprehensive inpatient rehab setting. Physiatrist is providing close team supervision and 24 hour management of active medical problems listed below. Physiatrist and rehab team continue to assess barriers to discharge/monitor patient progress toward functional and medical goals  Care Tool:  Bathing    Body parts bathed by patient: Chest, Abdomen, Front perineal area, Buttocks, Right  upper leg, Left upper leg, Right lower leg, Left lower leg, Face, Right arm, Left arm         Bathing assist Assist Level: Moderate Assistance - Patient 50 - 74%     Upper Body Dressing/Undressing Upper body dressing   What is the patient wearing?: Pull over shirt    Upper body assist Assist Level: Minimal Assistance - Patient > 75%    Lower Body Dressing/Undressing Lower body dressing      What is the patient wearing?: Pants     Lower body assist Assist for lower body dressing: Maximal Assistance - Patient 25 - 49%     Toileting Toileting    Toileting assist Assist for toileting:  Maximal Assistance - Patient 25 - 49%     Transfers Chair/bed transfer  Transfers assist     Chair/bed transfer assist level: Moderate Assistance - Patient 50 - 74%     Locomotion Ambulation   Ambulation assist      Assist level: Moderate Assistance - Patient 50 - 74% Assistive device: Walker-rolling Max distance: 35   Walk 10 feet activity   Assist     Assist level: Moderate Assistance - Patient - 50 - 74% Assistive device: Walker-rolling   Walk 50 feet activity   Assist Walk 50 feet with 2 turns activity did not occur: Safety/medical concerns         Walk 150 feet activity   Assist Walk 150 feet activity did not occur: Safety/medical concerns         Walk 10 feet on uneven surface  activity   Assist Walk 10 feet on uneven surfaces activity did not occur: Safety/medical concerns         Wheelchair     Assist Is the patient using a wheelchair?: Yes Type of Wheelchair: Manual    Wheelchair assist level: Minimal Assistance - Patient > 75% Max wheelchair distance: 25    Wheelchair 50 feet with 2 turns activity    Assist        Assist Level: Moderate Assistance - Patient 50 - 74%   Wheelchair 150 feet activity     Assist      Assist Level: Dependent - Patient 0%   Blood pressure 112/82, pulse 66, temperature 98.1 F (36.7 C), temperature source Oral, resp. rate 20, height 5\' 4"  (1.626 m), weight 65.3 kg, SpO2 98%.  Medical Problem List and Plan: 1. Functional deficits secondary to persistent dural venous sinus thrombosis             -patient may  shower             -ELOS/Goals: min A- 18-21 days             Admit to CIR  Con't CIR PT, OT and SLP  Got head CT STAT today due to increasing HA- think HA is from neck tightness which is making it worse- let family know was getting head CT after saw him initially , then went back and went over results with interpretor 2.  Antithrombotics: -DVT/anticoagulation:   Pharmaceutical: Coumadin  5/9- Last INR 2.3- managed by pharmacy             -antiplatelet therapy: N/A 3. Pain Management: Fioricet  as needed for refractory headaches, Tylenol  as needed  5/9- having HA's- they attempted Topamax - might need to try Elavil- if doesn't improve by tomorrow, will add Elavil 25 mg at bedtime  5/10- started Elavil 25 mg at bedtime- and lidoderm  patches- 2 patches  8pm to 8am- increased Fioricet  to q4 hours prn- cannot have Topamax  due to low Bicarb levels 4. Mood/Behavior/Sleep: Provide emotional support             -antipsychotic agents: N/A 5. Neuropsych/cognition: This patient is capable of making decisions on his own behalf. 6. Skin/Wound Care: Routine skin checks 7. Fluids/Electrolytes/Nutrition: Routine in and outs with follow-up chemistries 8.  Cerebral edema.  Hypertonic saline discontinued 9.  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  Status post IR for thrombectomy was discharged on Eliquis - now changed to Coumadin  10.  History of stroke/recent viral meningitis.  Completed course of Doxy 11.  Refractory headaches.  Medrol  Dosepak.  Topamax  discontinued due to low bicarb- might need to start Elavil, since HA's still 6/10  5/9- still having constant HA's  5/10- pain up to 10/10 today- got head CT due to 10/10 HA- looks OK- no changes- reexamined pt's neck- very tight musculature- will order Robaxin  750 mg q6 hours prn for neck/posterior HA's- as well as lidocaine  patches nightly- added Elavil 25 mg at bedtime for prevention- and asked therapy to do myofascial release for pt.  12.  Hyperlipidemia.  Lipitor  13.  Congenital Deafness.  Patient does use some sign language- but mainly gestures per ASL interpretor 14.  GERD.  Protonix   15. Abd pain  5/9- pt clear having constant abd pain- will get CT of abd/pelvis with contrast to assess abd pain   5/10- Abd CT looks good  -LBM 3 days ago- ordered Sorbitol for constipation  I spent a total of  59   minutes on  total care today- >50% coordination of care- due to  D/w pt x3 times 2x with interpretor- to go over CT scan of abd and CT of head and plan  LOS: 2 days A FACE TO FACE EVALUATION WAS PERFORMED  Farida Mcreynolds 03/20/2024, 3:33 PM

## 2024-03-20 NOTE — Progress Notes (Signed)
 Physical Therapy Session Note  Patient Details  Name: Samuel Warren MRN: [id number] Date of Birth: 02-Feb-1991  Today's Date: 03/20/2024 PT Individual Time: 6295-2841 PT Individual Time Calculation (min): 84 min   Short Term Goals: Week 1:  PT Short Term Goal 1 (Week 1): Pt will transfer sup to sit w/ supervision consistently. PT Short Term Goal 2 (Week 1): Pt will improve sitting balance w/ supervision x 5' PT Short Term Goal 3 (Week 1): Pt will transfer sit to stand w/ min A consistently. PT Short Term Goal 4 (Week 1): Pt will amb w/ min A x 50'  Skilled Therapeutic Interventions/Progress Updates:   Received pt semi-reclined in bed reporting 10/10 headache (premedicated). ASL interpreter present as well as pt's mother (speaks Margart Shears but no interpreter present). Pt/mother with concerns regarding recent CT scan - RN and MD notified and MD present during session to review results with pt/family - called pt's brother on phone to interpret for mother.  Pt reported urge to void and transferred supine<>sitting R EOB from flat bed using bedrails and supervision. Noted posterior lean sitting EOB and required max cues to scoot to EOB to get feet on floor. Pt required CGA/mod A for static sitting balance due to posterior bias and cues for UE support on bedrails to support himself. Pt performed all transfers with RW and mod A throughout session - noted spasticity in RLE, posterior bias, and difficulty with motor planning/sequencing. Stood from EOB with RW and mod A and required mod A for clothing management. Pt unable to hold urine and soiled brief/pants. able to continue voiding while standing with mod A for balance. Transferred into WC via stand<>pivot with RW and mod A. Then stood and removed soiled clothing with max A and pt washed lower body standing with mod A for balance. Donned clean pants and brief with max A and doffed dirty shirt with min A and donned clean one with supervision.   Performed hand  hygiene seated at sink with supervision and cues for sequencing. MD requesting soft tissue massage to posterior cervical musculature - attempted, but pt unable to tolerate due to pain and hypersensitivity R>L. Pt requesting to return to bed without RW - mod A stand<>pivot WC<>bed with bedrail with mod cues for sequencing. Transitioned into supine with supervision. Per RN, pt has been attempting to get OOB on his own or family has been assisting pt OOB - called son (who speaks [place]) and informed family that pt is only to be assisted with staff and is at a high fall risk - son verbalized understanding and communicated this to mother while ASL interpreter explained this to pt - unsure if pt understood.   Increased time spent assisting pt with meal orders, anticipate pt has just been receiving random trays due to difficulty communicating. Created temporary meal chart with basic pictures of foods for pt to order meals while trying to determine from interpreter what foods pt likes (salmon, salads, baked potatoes) and relayed message to RN to order food. Concluded session with pt supine in bed, needs within reach, and bed alarm on.   Therapy Documentation Precautions:  Precautions Precautions: Fall, Other (comment) Recall of Precautions/Restrictions: Intact Precaution/Restrictions Comments: R inattention Restrictions Weight Bearing Restrictions Per Provider Order: No  Therapy/Group: Individual Therapy [name] Nena Bank PT, DPT 03/20/2024, 7:11 AM

## 2024-03-20 NOTE — Progress Notes (Signed)
 Patient c/o headache being the worse via interpreter. Patient was given Fioricet  by preceding shift. Patient was given tylenol  by nurse. Therapist spoke with MD and MD placed a new order for Fioricet  at every four hour intervals and placed order for Stat CT of the head. Patient was taken down to CT. Awaiting return and results. Jenetta Misty, RN

## 2024-03-20 NOTE — Progress Notes (Signed)
 Occupational Therapy Session Note  Patient Details  Name: Samuel Warren MRN: [id number] Date of Birth: 10-25-1991  Today's Date: 03/20/2024 OT Individual Time: 4098-1191 OT Individual Time Calculation (min): 39 min  OT Missed Time: 21 min d/t pain (headache)  Short Term Goals: Week 1:  OT Short Term Goal 1 (Week 1): Patient will complete LB dressing with min A OT Short Term Goal 2 (Week 1): Patient will complete toileting tasks with min A OT Short Term Goal 3 (Week 1): Patient will maintain dynamic standing balance iwth min A  Skilled Therapeutic Interventions/Progress Updates:     Pt received reclined in bed, dressed and ready for the day upon OT arrival, politely declining need for ADLs this session. ASL interpreter present throughout session to optimize communication. Pt presenting to be in good spirits receptive to skilled OT session reporting 10/10 pain 2/2 HA- OT offering intermittent rest breaks, repositioning, and therapeutic support to optimize participation in therapy session. Informed RN/MD of Pt's pain- Pt premedicated prior to OT session. Pt limited throughout session d/t HA, perseverating on pain and requiring increased time for rest breaks and to initiate participation. Focused this session on body awareness, R side attention, NMRE, and Pt education. Pt was able to transition to EOB with CGA +increased time and maximal effort using bed features. Sitting EOB, Pt initially required min A for sitting balance, however with increased cues for trunk alignment and  head position, Pt able to maintain sitting balance CGA-min A. Worked on sit <> stands and lateral weight shifting at beginning of session with mirror positioned anterior to Pt for improved visual feedback of body positioning- Pt able to complete with min A overall with max multimodal cues required for trunk positioning and weight shifting to decrease posterior pulsion. Engaged Pt in completing functional mobility visual scanning  task ambulating around therapy gym with HHA to locate color cones to increase attention to R visual field- Pt required min A for balance during mobility +consistent multimodal cues for head positioning and attention to R visual field. Pt reporting dizziness following and requesting to return to bed. Transported Pt back to room total A in wc. Stand pivot wc> EOB min A EOB > supine CGA. Assessed vitals sitting EOB PSO2 98% HR 57 BP 126/84. Pt requesting to end session early d/t HA. Informed medical team of Pt's status. Pt was left resting in bed with call bell in reach, bed alarm on, and all needs met.  Missed 21 min skilled OT treatment d/t head ache. Will attempt to make up time as Pt's status and schedule allow.   Therapy Documentation Precautions:  Precautions Precautions: Fall, Other (comment) Recall of Precautions/Restrictions: Intact Precaution/Restrictions Comments: R inattention Restrictions Weight Bearing Restrictions Per Provider Order: No  Therapy/Group: Individual Therapy  Geoffery Kiel 03/20/2024, 7:01 AM

## 2024-03-20 NOTE — Plan of Care (Signed)
  Problem: Consults Goal: RH STROKE PATIENT EDUCATION Description: See Patient Education module for education specifics  Outcome: Progressing   Problem: RH BOWEL ELIMINATION Goal: RH STG MANAGE BOWEL WITH ASSISTANCE Description: STG Manage Bowel with  minimal Assistance. Outcome: Progressing   Problem: RH BLADDER ELIMINATION Goal: RH STG MANAGE BLADDER WITH ASSISTANCE Description: STG Manage Bladder With  minimal Assistance Outcome: Progressing   Problem: RH SKIN INTEGRITY Goal: RH STG SKIN FREE OF INFECTION/BREAKDOWN Description: Manage skin free of infection/ breakdown with minimal assistance Outcome: Progressing   Problem: RH SAFETY Goal: RH STG ADHERE TO SAFETY PRECAUTIONS W/ASSISTANCE/DEVICE Description: STG Adhere to Safety Precautions With minimal  Assistance/Device. Outcome: Progressing   Problem: RH COGNITION-NURSING Goal: RH STG USES MEMORY AIDS/STRATEGIES W/ASSIST TO PROBLEM SOLVE Description: STG Uses Memory Aids/Strategies With minimal Assistance to Problem Solve. Outcome: Progressing   Problem: RH PAIN MANAGEMENT Goal: RH STG PAIN MANAGED AT OR BELOW PT'S PAIN GOAL Description: <4 w/ prns Outcome: Progressing   Problem: RH KNOWLEDGE DEFICIT Goal: RH STG INCREASE KNOWLEGDE OF HYPERLIPIDEMIA Description: Manage increase knowledge of hyperlipidemia with minimal assistance from spouse and family members using educational materials provided Outcome: Progressing Goal: RH STG INCREASE KNOWLEDGE OF STROKE PROPHYLAXIS Description: Manage increase knowledge of stroke prohylaxis with minimal assistance from spouse and family members using educational materials provided Outcome: Progressing

## 2024-03-21 DIAGNOSIS — G08 Intracranial and intraspinal phlebitis and thrombophlebitis: Secondary | ICD-10-CM | POA: Diagnosis not present

## 2024-03-21 LAB — PROTIME-INR
INR: 2.1 — ABNORMAL HIGH (ref 0.8–1.2)
Prothrombin Time: 24 s — ABNORMAL HIGH (ref 11.4–15.2)

## 2024-03-21 MED ORDER — WARFARIN SODIUM 3 MG PO TABS
3.0000 mg | ORAL_TABLET | Freq: Once | ORAL | Status: AC
  Administered 2024-03-21: 3 mg via ORAL
  Filled 2024-03-21: qty 1

## 2024-03-21 NOTE — Plan of Care (Signed)
  Problem: Consults Goal: RH STROKE PATIENT EDUCATION Description: See Patient Education module for education specifics  Outcome: Progressing   Problem: RH BOWEL ELIMINATION Goal: RH STG MANAGE BOWEL WITH ASSISTANCE Description: STG Manage Bowel with  minimal Assistance. Outcome: Progressing   Problem: RH BLADDER ELIMINATION Goal: RH STG MANAGE BLADDER WITH ASSISTANCE Description: STG Manage Bladder With  minimal Assistance Outcome: Progressing   Problem: RH SKIN INTEGRITY Goal: RH STG SKIN FREE OF INFECTION/BREAKDOWN Description: Manage skin free of infection/ breakdown with minimal assistance Outcome: Progressing   Problem: RH SAFETY Goal: RH STG ADHERE TO SAFETY PRECAUTIONS W/ASSISTANCE/DEVICE Description: STG Adhere to Safety Precautions With minimal  Assistance/Device. Outcome: Progressing   Problem: RH COGNITION-NURSING Goal: RH STG USES MEMORY AIDS/STRATEGIES W/ASSIST TO PROBLEM SOLVE Description: STG Uses Memory Aids/Strategies With minimal Assistance to Problem Solve. Outcome: Progressing   Problem: RH PAIN MANAGEMENT Goal: RH STG PAIN MANAGED AT OR BELOW PT'S PAIN GOAL Description: <4 w/ prns Outcome: Progressing   Problem: RH KNOWLEDGE DEFICIT Goal: RH STG INCREASE KNOWLEGDE OF HYPERLIPIDEMIA Description: Manage increase knowledge of hyperlipidemia with minimal assistance from spouse and family members using educational materials provided Outcome: Progressing Goal: RH STG INCREASE KNOWLEDGE OF STROKE PROPHYLAXIS Description: Manage increase knowledge of stroke prohylaxis with minimal assistance from spouse and family members using educational materials provided Outcome: Progressing

## 2024-03-21 NOTE — Progress Notes (Signed)
 PHARMACY - ANTICOAGULATION CONSULT NOTE  Pharmacy Consult for Warfarin Indication:  central venous sinus thrombosis  No Known Allergies  Patient Measurements: Height: 5\' 4"  (162.6 cm) Weight: 65.3 kg (143 lb 15.4 oz) IBW/kg (Calculated) : 59.2 HEPARIN  DW (KG): 65.3  Vital Signs: Temp: 98.6 F (37 C) (05/11 0351) BP: 98/65 (05/11 0351) Pulse Rate: 57 (05/11 0351)  Labs: Recent Labs    03/19/24 0740 03/20/24 1130 03/21/24 0855  HGB 11.9*  --   --   HCT 36.0*  --   --   PLT 223  --   --   LABPROT 31.2* 30.3* 24.0*  INR 3.0* 2.9* 2.1*  CREATININE 0.96  --   --     Estimated Creatinine Clearance: 90.8 mL/min (by C-G formula based on SCr of 0.96 mg/dL).  Assessment: 13 YOM with history of central venous sinus thrombosis on Eliquis  PTA. He returned with new onset of R-sided weakness and cerebral edema.  Patient was initially on IV heparin , then transitioned back to PTA Eliquis .  Pharmacy consulted to transition patient to IV heparin  bridge to Warfarin starting 5/3. IV heparin  stopped on 5/6 with therapeutic INR and currently on Warfarin only.   INR upper range therapeutic (3.0). Up from INR 2.3 on 5/8 and Warfarin 4 mg given. May trend up more tomorrow.  Warfarin dose held 5/6 when INR trended 2.0>2.9, then INR trended down 5/7 and 5/8.    5/11 AM update: INR 2.1 Patient is eating anywhere from 10-100% of meals with last charted meals on 5/9 at 100%  Goal of Therapy:  INR 2-3 Monitor platelets by anticoagulation protocol: Yes   Plan:  Warfarin 3 mg x 1 today. Daily PT/INR Monitor for signs/symptoms of bleeding.  Marleta Simmer BS, PharmD, BCPS Clinical Pharmacist 03/21/2024 9:51 AM  Contact: [phone] after 3 PM  "Be curious, not judgmental..." -Rumalda Counter

## 2024-03-21 NOTE — Plan of Care (Signed)
  Problem: RH BOWEL ELIMINATION Goal: RH STG MANAGE BOWEL WITH ASSISTANCE Description: STG Manage Bowel with  minimal Assistance. Outcome: Progressing   Problem: RH BLADDER ELIMINATION Goal: RH STG MANAGE BLADDER WITH ASSISTANCE Description: STG Manage Bladder With  minimal Assistance Outcome: Progressing   Problem: RH SAFETY Goal: RH STG ADHERE TO SAFETY PRECAUTIONS W/ASSISTANCE/DEVICE Description: STG Adhere to Safety Precautions With  minimal Assistance/Device. Outcome: Progressing

## 2024-03-21 NOTE — Progress Notes (Addendum)
 PROGRESS NOTE   Subjective/Complaints:  Pt reports pain 10/10 when points to communication board, but called pt's son- he reports pt's HA is doing somewhat better with changes yesterday.  Abd pain better- not reporting any abd pain.    ROS: Limited by pt's deafness  Objective:   CT HEAD WO CONTRAST ( ) Result Date: 03/20/2024 CLINICAL DATA:  Dural venous sinus thrombosis suspected has dural sinus thrombosis but HA's have spiked- up to 10/10- so cocnerned there s achange in thrombosis EXAM: CT HEAD WITHOUT CONTRAST TECHNIQUE: Contiguous axial images were obtained from the base of the skull through the vertex without intravenous contrast. RADIATION DOSE REDUCTION: This exam was performed according to the departmental dose-optimization program which includes automated exposure control, adjustment of the mA and/or kV according to patient size and/or use of iterative reconstruction technique. COMPARISON:  None Available. FINDINGS: Brain: Known areas of subacute infarct were better characterized on prior MRI. No evidence of new/interval acute large vascular territory infarct or acute hemorrhage. No evidence of hydrocephalus or mass lesion. Similar low lying tonsils. Partially empty sella. Vascular: Redemonstrated areas of hyperdensity within the dural venous sinuses, compatible with known dural venous sinus thrombosis. Skull: No acute fracture. Sinuses/Orbits: Clear sinuses.  No acute orbital findings. IMPRESSION: 1. No significant change in comparison to May 5 CT head. Known areas of subacute infarct were better characterized on MRI. No evidence of new/interval acute large vascular territory infarct or acute hemorrhage. 2. Redemonstrated areas of hyperdensity within the dural venous sinuses, compatible with known dural venous sinus thrombosis that was better characterized on prior CTV and MRI. 3. Similar low lying cerebellar tonsils. Electronically  Signed   By: Stevenson Elbe M.D.   On: 03/20/2024 11:38   CT ABDOMEN PELVIS W WO CONTRAST Result Date: 03/20/2024 CLINICAL DATA:  Constant abdominal pain. Previous dural venous thrombosis. EXAM: CT ABDOMEN AND PELVIS WITHOUT AND WITH CONTRAST TECHNIQUE: Multidetector CT imaging of the abdomen and pelvis was performed following the standard protocol before and following the bolus administration of intravenous contrast. RADIATION DOSE REDUCTION: This exam was performed according to the departmental dose-optimization program which includes automated exposure control, adjustment of the mA and/or kV according to patient size and/or use of iterative reconstruction technique. CONTRAST:  75mL OMNIPAQUE  IOHEXOL  350 MG/ML SOLN COMPARISON:  X-ray 03/18/2024. FINDINGS: Lower chest: Mild ground-glass along the dependent lungs with some atelectasis. Trace pleural fluid. Hepatobiliary: Small area of focal fat deposition seen liver adjacent to the falciform ligament. Segment 4. No other space-occupying liver lesion identified at this time. Gallbladder is nondilated. Patent portal vein. Pancreas: Unremarkable. No pancreatic ductal dilatation or surrounding inflammatory changes. Spleen: Normal in size without focal abnormality. Adrenals/Urinary Tract: Adrenal glands are unremarkable. Kidneys are normal, without renal calculi, focal lesion, or hydronephrosis. Bladder has a preserved contour. There is a small area of slight bladder wall thickening anterior in the midline uncertain etiology and significance. Stomach/Bowel: Large bowel has a normal course and caliber. Scattered air-fluid levels identified along a: Particularly the transverse colon and some in the descending colon. Normal appendix in the right lower quadrant posteroinferior to the cecum. Mild debris and fluid in the stomach. Small bowel is nondilated. Vascular/Lymphatic:  No significant vascular findings are present. No enlarged abdominal or pelvic lymph nodes.  Reproductive: Prostate is unremarkable. Other: No free air or free fluid identified. Motion throughout the examination limiting evaluation. Musculoskeletal: Scattered Schmorl's nodes. Mild endplate osteophytes in the lumbar spine. IMPRESSION: No bowel obstruction, free air or free fluid. There are a few loops of colon with some air-fluid levels, nonspecific. Normal appendix. Small area of mild bladder wall thickening along the anterior aspect of bladder in the midline of uncertain etiology and significance. Correlate with symptoms. Motion Electronically Signed   By: Adrianna Horde M.D.   On: 03/20/2024 10:15   Recent Labs    03/19/24 0740  WBC 6.8  HGB 11.9*  HCT 36.0*  PLT 223   Recent Labs    03/19/24 0740  NA 142  K 3.5  CL 112*  CO2 22  GLUCOSE 104*  BUN 14  CREATININE 0.96  CALCIUM  9.2   No intake or output data in the 24 hours ending 03/21/24 1213       Physical Exam: Vital Signs Blood pressure 98/65, pulse (!) 57, temperature 98.6 F (37 C), resp. rate 16, height 5\' 4"  (1.626 m), weight 65.3 kg, SpO2 97%.     General: awake, alert, appropriate, appears comfortable- mother at bedside; NAD HENT: conjugate gaze; oropharynx moist CV: regular rhythm, slightly bradycardic rate; no JVD Pulmonary: CTA B/L; no W/R/R- good air movement GI: soft, NT, ND, (+)BS- more normoactive Psychiatric: appropriate Neurological: deaf- speaks [place]- hard ot assess cognition MSK- neck tightness severe in upper traps, splenius capitus and levators and scalenes- much looser this AM Musculoskeletal:        General: Normal range of motion.     Cervical back: Neck supple. No tenderness.     Comments:  Basically 5-5/5 in all 4 extremities- but hard to test all muscles due to deafness and language barrier  Skin:    General: Skin is warm and dry.     Comments:  IV out of neck is gone- wounds healing  Neurological:     Mental Status: He is alert. He is disoriented.     Comments: Patient is  alert makes eye contact with examiner.  Exam limited by language barrier.  Follows simple demonstrated commands.  His brother who was in the room does help to translate. Mild ataxia Mild R inattention Mild ot moderate searching pattern on B/L finger to nose   Assessment/Plan: 1. Functional deficits which require 3+ hours per day of interdisciplinary therapy in a comprehensive inpatient rehab setting. Physiatrist is providing close team supervision and 24 hour management of active medical problems listed below. Physiatrist and rehab team continue to assess barriers to discharge/monitor patient progress toward functional and medical goals  Care Tool:  Bathing    Body parts bathed by patient: Chest, Abdomen, Front perineal area, Buttocks, Right upper leg, Left upper leg, Right lower leg, Left lower leg, Face, Right arm, Left arm         Bathing assist Assist Level: Moderate Assistance - Patient 50 - 74%     Upper Body Dressing/Undressing Upper body dressing   What is the patient wearing?: Pull over shirt    Upper body assist Assist Level: Minimal Assistance - Patient > 75%    Lower Body Dressing/Undressing Lower body dressing      What is the patient wearing?: Pants     Lower body assist Assist for lower body dressing: Maximal Assistance - Patient 25 - 49%  Toileting Toileting    Toileting assist Assist for toileting: Maximal Assistance - Patient 25 - 49%     Transfers Chair/bed transfer  Transfers assist     Chair/bed transfer assist level: Moderate Assistance - Patient 50 - 74%     Locomotion Ambulation   Ambulation assist      Assist level: Moderate Assistance - Patient 50 - 74% Assistive device: Walker-rolling Max distance: 35   Walk 10 feet activity   Assist     Assist level: Moderate Assistance - Patient - 50 - 74% Assistive device: Walker-rolling   Walk 50 feet activity   Assist Walk 50 feet with 2 turns activity did not occur:  Safety/medical concerns         Walk 150 feet activity   Assist Walk 150 feet activity did not occur: Safety/medical concerns         Walk 10 feet on uneven surface  activity   Assist Walk 10 feet on uneven surfaces activity did not occur: Safety/medical concerns         Wheelchair     Assist Is the patient using a wheelchair?: Yes Type of Wheelchair: Manual    Wheelchair assist level: Minimal Assistance - Patient > 75% Max wheelchair distance: 25    Wheelchair 50 feet with 2 turns activity    Assist        Assist Level: Moderate Assistance - Patient 50 - 74%   Wheelchair 150 feet activity     Assist      Assist Level: Dependent - Patient 0%   Blood pressure 98/65, pulse (!) 57, temperature 98.6 F (37 C), resp. rate 16, height 5\' 4"  (1.626 m), weight 65.3 kg, SpO2 97%.  Medical Problem List and Plan: 1. Functional deficits secondary to persistent dural venous sinus thrombosis             -patient may  shower             -ELOS/Goals: min A- 18-21 days             Admit to CIR  Con't CIR  Pt's HA getting better per son by phone 2.  Antithrombotics: -DVT/anticoagulation:  Pharmaceutical: Coumadin  5/9- Last INR 2.3- managed by pharmacy             -antiplatelet therapy: N/A 3. Pain Management: Fioricet  as needed for refractory headaches, Tylenol  as needed  5/9- having HA's- they attempted Topamax - might need to try Elavil- if doesn't improve by tomorrow, will add Elavil 25 mg at bedtime  5/10- started Elavil 25 mg at bedtime- and lidoderm  patches- 2 patches 8pm to 8am- increased Fioricet  to q4 hours prn- cannot have Topamax  due to low Bicarb levels  5/11- HA somewhat better this AM per son by phone- pt continues to point at 10/10- but muscles in neck much looser 4. Mood/Behavior/Sleep: Provide emotional support             -antipsychotic agents: N/A 5. Neuropsych/cognition: This patient is? capable of making decisions on his own behalf. 6.  Skin/Wound Care: Routine skin checks 7. Fluids/Electrolytes/Nutrition: Routine in and outs with follow-up chemistries 8.  Cerebral edema.  Hypertonic saline discontinued 9.  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  Status post IR for thrombectomy was discharged on Eliquis - now changed to Coumadin  10.  History of stroke/recent viral meningitis.  Completed course of Doxy 11.  Refractory headaches.  Medrol  Dosepak.  Topamax  discontinued due to low bicarb- might need to start Elavil,  since HA's still 6/10  5/9- still having constant HA's  5/10- pain up to 10/10 today- got head CT due to 10/10 HA- looks OK- no changes- reexamined pt's neck- very tight musculature- will order Robaxin  750 mg q6 hours prn for neck/posterior HA's- as well as lidocaine  patches nightly- added Elavil 25 mg at bedtime for prevention- and asked therapy to do myofascial release for pt.   5/11- muscles looser- HA appears to be less- appears more comfortable 12.  Hyperlipidemia.  Lipitor  13.  Congenital Deafness.  Patient does use some sign language- but mainly gestures per ASL interpretor 14.  GERD.  Protonix   15. Abd pain  5/9- pt clear having constant abd pain- will get CT of abd/pelvis with contrast to assess abd pain   5/10- Abd CT looks good  -LBM 3 days ago- ordered Sorbitol for constipation  5/11- LBM yesterday   I spent a total of 36   minutes on total care today- >50% coordination of care- due to  Did IPOC as wlel as talked pt pt, nursing, mother and son on phone LOS: 3 days A FACE TO FACE EVALUATION WAS PERFORMED  Tatjana Turcott 03/21/2024, 12:13 PM

## 2024-03-21 NOTE — IPOC Note (Signed)
 Overall Plan of Care [facility]) Patient Details Name: Samuel Warren MRN: [id number] DOB: Dec 21, 1990  Admitting Diagnosis: Dural venous sinus thrombosis  Hospital Problems: Principal Problem:   Dural venous sinus thrombosis     Functional Problem List: Nursing Bladder, Edema, Endurance, Medication Management, Nutrition, Safety  PT Balance, Safety, Endurance, Pain, Motor  OT Endurance, Motor, Perception, Safety  SLP Linguistic  TR         Basic ADL's: OT Grooming, Bathing, Dressing, Toileting, Eating     Advanced  ADL's: OT       Transfers: PT Bed Mobility, Bed to Chair, Car, Occupational psychologist, Research scientist (life sciences): PT Ambulation, Psychologist, prison and probation services, Stairs     Additional Impairments: OT Fuctional Use of Upper Extremity  SLP Communication expression    TR      Anticipated Outcomes Item Anticipated Outcome  Self Feeding Supervision  Swallowing      Basic self-care  Marketing executive Transfers Supervision  Bowel/Bladder  manage bowels with medications  Transfers  supervision  Locomotion  supervision w/ LRAD  Communication  mod i  Cognition     Pain  <4 w/ prns  Safety/Judgment  manage safety with minimal assistance   Therapy Plan: PT Intensity: Minimum of 1-2 x/day ,45 to 90 minutes PT Frequency: 5 out of 7 days PT Duration Estimated Length of Stay: 14-17 days OT Intensity: Minimum of 1-2 x/day, 45 to 90 minutes OT Frequency: 5 out of 7 days OT Duration/Estimated Length of Stay: 14-17 days SLP Intensity: Minumum of 1-2 x/day, 30 to 90 minutes SLP Frequency: 1 to 3 out of 7 days SLP Duration/Estimated Length of Stay: 18-21 days   Team Interventions: Nursing Interventions Patient/Family Education, Medication Management, Bladder Management, Bowel Management, Disease Management/Prevention, Discharge Planning  PT interventions Ambulation/gait training, Community reintegration, Neuromuscular re-education, Stair  training, UE/LE Strength taining/ROM, Wheelchair propulsion/positioning, UE/LE Coordination activities, Therapeutic Activities, Pain management, Discharge planning, Warden/ranger, Functional mobility training, Patient/family education, Therapeutic Exercise  OT Interventions Warden/ranger, Cognitive remediation/compensation, Community reintegration, Discharge planning, Disease mangement/prevention, DME/adaptive equipment instruction, Functional electrical stimulation, Functional mobility training, Neuromuscular re-education, Pain management, Patient/family education, Psychosocial support, Self Care/advanced ADL retraining, Skin care/wound managment, Splinting/orthotics, Therapeutic Activities, Therapeutic Exercise, UE/LE Strength taining/ROM, UE/LE Coordination activities, Visual/perceptual remediation/compensation, Wheelchair propulsion/positioning  SLP Interventions Cueing hierarchy, Functional tasks, Internal/external aids, Multimodal communication approach, Patient/family education, Speech/Language facilitation, Therapeutic Activities  TR Interventions    SW/CM Interventions Discharge Planning, Psychosocial Support, Patient/Family Education   Barriers to Discharge MD  Medical stability, Home enviroment access/loayout, Lack of/limited family support, Weight, Weight bearing restrictions, and pt is deaf  Nursing Decreased caregiver support, Home environment access/layout, Incontinence Discharge: House  Discharge Home Layout: One level  Discharge Home Access: Stairs to enter  Entrance Stairs-Rails: Left, Right  Entrance Stairs-Number of Steps: 7  PT      OT      SLP Other (comments) (methods of communication)    SW Community education officer for SNF coverage, Other (comments) language barrier   Team Discharge Planning: Destination: PT-Home ,OT- Home , SLP-Home Projected Follow-up: PT-Home health PT, OT-  Outpatient OT, SLP-None Projected Equipment Needs: PT-To be determined, OT- To be  determined, SLP-None recommended by SLP Equipment Details: PT-has RW, OT-  Patient/family involved in discharge planning: PT- Patient,  OT-Patient, SLP-Patient  MD ELOS: 14-17 days Medical Rehab Prognosis:  Good Assessment: The patient has been admitted for CIR therapies with the diagnosis of CSVT. The team will be addressing  functional mobility, strength, stamina, balance, safety, adaptive techniques and equipment, self-care, bowel and bladder mgt, patient and caregiver education, . Goals have been set at mod I to supervision. Anticipated discharge destination is home with family.        See Team Conference Notes for weekly updates to the plan of care

## 2024-03-22 DIAGNOSIS — K5900 Constipation, unspecified: Secondary | ICD-10-CM

## 2024-03-22 DIAGNOSIS — D649 Anemia, unspecified: Secondary | ICD-10-CM

## 2024-03-22 DIAGNOSIS — R519 Headache, unspecified: Secondary | ICD-10-CM

## 2024-03-22 LAB — CBC
HCT: 38.5 % — ABNORMAL LOW (ref 39.0–52.0)
Hemoglobin: 12.3 g/dL — ABNORMAL LOW (ref 13.0–17.0)
MCH: 26.7 pg (ref 26.0–34.0)
MCHC: 31.9 g/dL (ref 30.0–36.0)
MCV: 83.7 fL (ref 80.0–100.0)
Platelets: 270 10*3/uL (ref 150–400)
RBC: 4.6 MIL/uL (ref 4.22–5.81)
RDW: 13.1 % (ref 11.5–15.5)
WBC: 5.8 10*3/uL (ref 4.0–10.5)
nRBC: 0 % (ref 0.0–0.2)

## 2024-03-22 LAB — MISC LABCORP TEST (SEND OUT)
LabCorp test name: 8664
Labcorp test code: 8664

## 2024-03-22 LAB — PROTIME-INR
INR: 2.1 — ABNORMAL HIGH (ref 0.8–1.2)
Prothrombin Time: 23.9 s — ABNORMAL HIGH (ref 11.4–15.2)

## 2024-03-22 LAB — SUSCEPTIBILITY, GRAM POS RODS
Erythromycin MIC: >4 ug/mL
Gentamicin MIC: <=1 ug/mL
Penicillin MIC: 2 ug/mL
Rifampin MIC: <=1 ug/mL
Tetracycline MIC: <=1 ug/mL
Vancomycin MIC: <=2 ug/mL

## 2024-03-22 LAB — ORGANISM ID, BACTERIA

## 2024-03-22 MED ORDER — WARFARIN SODIUM 3 MG PO TABS
3.0000 mg | ORAL_TABLET | Freq: Once | ORAL | Status: AC
  Administered 2024-03-22: 3 mg via ORAL
  Filled 2024-03-22: qty 1

## 2024-03-22 MED ORDER — METHOCARBAMOL 500 MG PO TABS
500.0000 mg | ORAL_TABLET | Freq: Every day | ORAL | Status: DC
  Administered 2024-03-23 – 2024-04-07 (×16): 500 mg via ORAL
  Filled 2024-03-22 (×16): qty 1

## 2024-03-22 NOTE — Progress Notes (Signed)
 PHARMACY - ANTICOAGULATION CONSULT NOTE  Pharmacy Consult for Warfarin Indication:  central venous sinus thrombosis  No Known Allergies  Patient Measurements: Height: 5\' 4"  (162.6 cm) Weight: 65.3 kg (143 lb 15.4 oz) IBW/kg (Calculated) : 59.2 HEPARIN  DW (KG): 65.3  Vital Signs: Temp: 98.5 F (36.9 C) (05/12 0403) Temp Source: Oral (05/12 0403) BP: 104/80 (05/12 0403) Pulse Rate: 66 (05/12 0403)  Labs: Recent Labs    03/20/24 1130 03/21/24 0855 03/22/24 0621  HGB  --   --  12.3*  HCT  --   --  38.5*  PLT  --   --  270  LABPROT 30.3* 24.0* 23.9*  INR 2.9* 2.1* 2.1*    Estimated Creatinine Clearance: 90.8 mL/min (by C-G formula based on SCr of 0.96 mg/dL).  Assessment: 7 YOM with history of central venous sinus thrombosis on Eliquis  PTA. He returned with new onset of R-sided weakness and cerebral edema.  Patient was initially on IV heparin , then transitioned back to PTA Eliquis .  Pharmacy consulted to transition patient to IV heparin  bridge to Warfarin starting 5/3. IV heparin  stopped on 5/6 with therapeutic INR and currently on Warfarin only.   Warfarin dose held 5/6 when INR trended 2.0>2.9, then INR trended down 5/7 and 5/8.    5/12: Update: INR  is 2.1,  therapeutic.  Hgb 12.3 stable and pltc 270 wnl /stable.  No bleeding reported.   Based on warfarin dosing and INR results over past 9 days, I expect patient may need approximately 3mg  daily to keep INR 2-3.  We will continue to order warfarin each day for now, observing INR trend and stablility. Then determine a maintenance daily dose as soon as possible.   Patient is eating anywhere from 10-100% of meals with last charted meals on 5/9 at 100%   Potential drug interactions with warfarin are divalproex  (may increase warfarin effect) and butalbital  (in Fioricet ) which can decrease warfarin effect.   Goal of Therapy:  INR 2-3 Monitor platelets by anticoagulation protocol: Yes   Plan:  Warfarin 3 mg x 1  today. Daily PT/INR Monitor for signs/symptoms of bleeding. Follow CBC every Monday and Thursday for now and plan to reduce checks if stable.   Thank you for allowing pharmacy to be part of this patients care team. Alisa Irish, RPh Clinical Pharmacist [phone] until 3:30 PM then Please check AMION for all [facility] Pharmacy phone numbers After 10:0 PM, call Main Pharmacy [phone]  03/22/2024 10:15 AM

## 2024-03-22 NOTE — Plan of Care (Signed)
  Problem: RH Expression Communication Goal: LTG Patient will express needs/wants via multi-modal(SLP) Description: LTG:  Patient will express needs/wants via multi-modal communication (gestures/written, etc) with cues (SLP) Outcome: Completed/Met

## 2024-03-22 NOTE — Progress Notes (Signed)
 Physical Therapy Session Note  Patient Details  Name: Samuel Warren MRN: [id number] Date of Birth: [birth year]  Today's Date: 03/22/2024 PT Individual Time: 1300-1400 PT Individual Time Calculation (min): 60 min   Short Term Goals: Week 1:  PT Short Term Goal 1 (Week 1): Pt will transfer sup to sit w/ supervision consistently. PT Short Term Goal 2 (Week 1): Pt will improve sitting balance w/ supervision x 5' PT Short Term Goal 3 (Week 1): Pt will transfer sit to stand w/ min A consistently. PT Short Term Goal 4 (Week 1): Pt will amb w/ min A x 50'  Skilled Therapeutic Interventions/Progress Updates: Pt presents scooted down in bed and continuing to c/o 10/10 pain to neck and head.  Pt had received pain meds prior to session per nsg.  Through interpreter and gestures, pt sat EOB w/ CGA, but requires constant cues for scooting forward and maintaining feet on floor or will retropulse.  Raising head for interpreter causes retropulsion.  Pt transfers sit to stand w/ mod A mostly 2/2 impulsivity and retropulsion.  Once pt standing, min A required.  Pt amb x 60' w/ L HHA and min , facilitation for weight shift and improves as distance increases, cues for step length.  Pt amb x 6' w/ RW and min/mod A to Nu-step.  Pt performed Nu-step x 5' w/ tactile cues for maintaining neutral hip position.  Pt performed sit to stand w/ mod A and amb w/ HHA to w/c.  Pt sits w/ extreme post pelvic tilt and states pain from abd.  Pt returned to room and amb to bed, mod A for impulsivity when trying to sit before reaching bed.  Pt unable to receive instructions from ASL interpreter before attempting to sit.  Pt transfers sit to supine w/ CGA, tactile cues for lifting legs into bed, pulling on siderail.  Pt able to bridge to center of bed w/ cues.  Pt given HP to neck and head, nursing in to give muscle relaxer for neck pain.  Bed alarm on and all needs in reach.  Missed time of 15' 2/2 pain.     Therapy  Documentation Precautions:  Precautions Precautions: Fall, Other (comment) Recall of Precautions/Restrictions: Intact Precaution/Restrictions Comments: R inattention Restrictions Weight Bearing Restrictions Per Provider Order: No General: PT Amount of Missed Time (min): 15 Minutes PT Missed Treatment Reason: Patient unwilling to participate;Pain Vital Signs:   Pain:10/10 on scale Pain Assessment Pain Scale: Faces Faces Pain Scale: Hurts worst Pain Location: Head Pain Intervention(s): Medication (See eMAR)   Therapy/Group: Individual Therapy  Drevon Plog P Soumya Colson 03/22/2024, 2:04 PM

## 2024-03-22 NOTE — Progress Notes (Signed)
 Occupational Therapy Session Note  Patient Details  Name: Samuel Warren MRN: [id number] Date of Birth: 1991/01/07  Today's Date: 03/22/2024 OT Individual Time: 4098-1191 OT Individual Time Calculation (min): 17 min  and Today's Date: 03/22/2024 OT Missed Time: 43 Minutes Missed Time Reason: Pain;Patient fatigue   Short Term Goals: Week 1:  OT Short Term Goal 1 (Week 1): Patient will complete LB dressing with min A OT Short Term Goal 2 (Week 1): Patient will complete toileting tasks with min A OT Short Term Goal 3 (Week 1): Patient will maintain dynamic standing balance iwth min A  Skilled Therapeutic Interventions/Progress Updates:      Therapy Documentation Precautions:  Precautions Precautions: Fall, Other (comment) Recall of Precautions/Restrictions: Intact Precaution/Restrictions Comments: R inattention Restrictions Weight Bearing Restrictions Per Provider Order: No General: Pt supine in bed upon OT arrival, interpreter present  Pain: 10/10 pain reported in head, nsg made aware. OT provided positioning/modalities for pain management and therapeutic use of self for rapport building. OT provided heat pad for headache pre pt request. OT noted some potential light sensitivity for headache. OT, with assistance of interpreter, encouraging movement/participation in therapy. Pt declining further services this AM d/t pain in head. OT will attempt to make up missed time as able.    Pt supine in bed with bed alarm activated, 3 bed rails up, call light within reach and 4Ps assessed.   Therapy/Group: Individual Therapy  Nila Barth, OTD, OTR/L 03/22/2024, 10:28 AM

## 2024-03-22 NOTE — Progress Notes (Signed)
 PROGRESS NOTE   Subjective/Complaints:  Difficult to communicate  with pt this AM, no family in the room.  This afternoon family in the room and indicates he continues to have headache/neck pain but improved from a day or 2 ago.  No abdominal pain but he does have abdominal fullness.  Family says he has not had a bowel movement in about 3 days although nursing recorded large BM yesterday morning.   ROS: Limited by pt's deafness  Objective:   No results found.  Recent Labs    03/22/24 0621  WBC 5.8  HGB 12.3*  HCT 38.5*  PLT 270   No results for input(s): "NA", "K", "CL", "CO2", "GLUCOSE", "BUN", "CREATININE", "CALCIUM " in the last 72 hours.   Intake/Output Summary (Last 24 hours) at 03/22/2024 1812 Last data filed at 03/22/2024 1300 Gross per 24 hour  Intake 360 ml  Output --  Net 360 ml         Physical Exam: Vital Signs Blood pressure 98/68, pulse 78, temperature 98.3 F (36.8 C), temperature source Oral, resp. rate 18, height 5\' 4"  (1.626 m), weight 65.3 kg, SpO2 97%.     General: awake, alert, appropriate, appears comfortable-multiple family members in the room, NAD.  He is eating snacks  HENT: conjugate gaze; oropharynx moist CV: RRR Pulmonary: CTA B/L; no W/R/R, nonlabored GI: soft, NT, ND, (+)BS- more normoactive Psychiatric: appropriate Neurological: deaf- speaks [place]- hard to assess cognition MSK- neck tightness severe in upper traps, splenius capitus and levators and scalenes- much looser this AM Musculoskeletal:        General: Normal range of motion.     Cervical back: Neck supple. No tenderness.     Comments:  Basically 5-5/5 in all 4 extremities- but hard to test all muscles due to deafness and language barrier  Skin:    General: Skin is warm and dry.     Comments:  IV out of neck is gone- wounds healing  Neurological:     Mental Status: He is alert. He is disoriented.     Comments:  [name] makes eye contact with examiner.  Exam limited by language barrier.  Follows simple demonstrated commands.  His brother who was in the room does help to translate. Mild ataxia Mild R inattention Mild ot moderate searching pattern on B/L finger to nose   Assessment/Plan: 1. Functional deficits which require 3+ hours per day of interdisciplinary therapy in a comprehensive inpatient rehab setting. Physiatrist is providing close team supervision and 24 hour management of active medical problems listed below. Physiatrist and rehab team continue to assess barriers to discharge/monitor patient progress toward functional and medical goals  Care Tool:  Bathing    Body parts bathed by patient: Chest, Abdomen, Front perineal area, Buttocks, Right upper leg, Left upper leg, Right lower leg, Left lower leg, Face, Right arm, Left arm         Bathing assist Assist Level: Moderate Assistance - Patient 50 - 74%     Upper Body Dressing/Undressing Upper body dressing   What is the patient wearing?: Pull over shirt    Upper body assist Assist Level: Minimal Assistance - Patient > 75%  Lower Body Dressing/Undressing Lower body dressing      What is the patient wearing?: Pants     Lower body assist Assist for lower body dressing: Maximal Assistance - Patient 25 - 49%     Toileting Toileting    Toileting assist Assist for toileting: Maximal Assistance - Patient 25 - 49%     Transfers Chair/bed transfer  Transfers assist     Chair/bed transfer assist level: Moderate Assistance - Patient 50 - 74%     Locomotion Ambulation   Ambulation assist      Assist level: Moderate Assistance - Patient 50 - 74% Assistive device: Hand held assist Max distance: 60   Walk 10 feet activity   Assist     Assist level: Moderate Assistance - Patient - 50 - 74% Assistive device: Hand held assist   Walk 50 feet activity   Assist Walk 50 feet with 2 turns activity did  not occur: Safety/medical concerns  Assist level: Moderate Assistance - Patient - 50 - 74% Assistive device: Hand held assist    Walk 150 feet activity   Assist Walk 150 feet activity did not occur: Safety/medical concerns         Walk 10 feet on uneven surface  activity   Assist Walk 10 feet on uneven surfaces activity did not occur: Safety/medical concerns         Wheelchair     Assist Is the patient using a wheelchair?: Yes Type of Wheelchair: Manual    Wheelchair assist level: Minimal Assistance - Patient > 75% Max wheelchair distance: 25    Wheelchair 50 feet with 2 turns activity    Assist        Assist Level: Moderate Assistance - Patient 50 - 74%   Wheelchair 150 feet activity     Assist      Assist Level: Maximal Assistance - Patient 25 - 49%   Blood pressure 98/68, pulse 78, temperature 98.3 F (36.8 C), temperature source Oral, resp. rate 18, height 5\' 4"  (1.626 m), weight 65.3 kg, SpO2 97%.  Medical Problem List and Plan: 1. 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  Antithrombotics: -DVT/anticoagulation:  Pharmaceutical: Coumadin  5/9- Last INR 2.3- managed by pharmacy             -antiplatelet therapy: N/A 3. Pain Management: Fioricet  as needed for refractory headaches, Tylenol  as needed  5/9- having HA's- they attempted Topamax - might need to try Elavil- if doesn't improve by tomorrow, will add Elavil 25 mg at bedtime  5/10- started Elavil 25 mg at bedtime- and lidoderm  patches- 2 patches 8pm to 8am- increased Fioricet  to q4 hours prn- cannot have Topamax  due to low Bicarb levels  5/11- HA somewhat better this AM per son by phone- pt continues to point at 10/10- but muscles in neck much looser 4. Mood/Behavior/Sleep: Provide emotional support             -antipsychotic  agents: N/A 5. Neuropsych/cognition: This patient is? capable of making decisions on his own behalf. 6. Skin/Wound Care: Routine skin checks 7. Fluids/Electrolytes/Nutrition: Routine in and outs with follow-up chemistries 8.  Cerebral edema.  Hypertonic saline discontinued 9.  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  Status post IR for thrombectomy was discharged on Eliquis - now changed to Coumadin  10.  History of stroke/recent viral meningitis.  Completed course of Doxy 11.  Refractory headaches.  Medrol  Dosepak.  Topamax  discontinued due to low bicarb- might need to start Elavil, since HA's still 6/10  5/9- still having constant HA's  5/10- pain up to 10/10 today- got head CT due to 10/10 HA- looks OK- no changes- reexamined pt's neck- very tight musculature- will order Robaxin  750 mg q6 hours prn for neck/posterior HA's- as well as lidocaine  patches nightly- added Elavil 25 mg at bedtime for prevention- and asked therapy to do myofascial release for pt.   5/11- muscles looser- HA appears to be less- appears more comfortable  5/12 not using robaxin  frequently with schedule AM dose, family indicates headaches/neck pain overall improving 12.  Hyperlipidemia.  Lipitor  13.  Congenital Deafness.  Patient does use some sign language- but mainly gestures per ASL interpretor 14.  GERD.  Protonix   15. Abd pain  5/9- pt clear having constant abd pain- will get CT of abd/pelvis with contrast to assess abd pain   5/10- Abd CT looks good  -LBM 3 days ago- ordered Sorbitol for constipation  5/11- LBM yesterday  5/12 LBM documented yesterday 16. Anemia. Mild. -5/12 HGB 12.3, stable continue to monitor    LOS: 4 days A FACE TO FACE EVALUATION WAS PERFORMED  Lylia Sand 03/22/2024, 6:12 PM

## 2024-03-22 NOTE — Plan of Care (Signed)
  Problem: Consults Goal: RH STROKE PATIENT EDUCATION Description: See Patient Education module for education specifics  Outcome: Progressing   Problem: RH BOWEL ELIMINATION Goal: RH STG MANAGE BOWEL WITH ASSISTANCE Description: STG Manage Bowel with  minimal Assistance. Outcome: Progressing   Problem: RH BLADDER ELIMINATION Goal: RH STG MANAGE BLADDER WITH ASSISTANCE Description: STG Manage Bladder With  minimal Assistance Outcome: Progressing   Problem: RH SKIN INTEGRITY Goal: RH STG SKIN FREE OF INFECTION/BREAKDOWN Description: Manage skin free of infection/ breakdown with minimal assistance Outcome: Progressing   Problem: RH SAFETY Goal: RH STG ADHERE TO SAFETY PRECAUTIONS W/ASSISTANCE/DEVICE Description: STG Adhere to Safety Precautions With minimal  Assistance/Device. Outcome: Progressing   Problem: RH COGNITION-NURSING Goal: RH STG USES MEMORY AIDS/STRATEGIES W/ASSIST TO PROBLEM SOLVE Description: STG Uses Memory Aids/Strategies With minimal Assistance to Problem Solve. Outcome: Progressing   Problem: RH PAIN MANAGEMENT Goal: RH STG PAIN MANAGED AT OR BELOW PT'S PAIN GOAL Description: <4 w/ prns Outcome: Progressing   Problem: RH KNOWLEDGE DEFICIT Goal: RH STG INCREASE KNOWLEGDE OF HYPERLIPIDEMIA Description: Manage increase knowledge of hyperlipidemia with minimal assistance from spouse and family members using educational materials provided Outcome: Progressing Goal: RH STG INCREASE KNOWLEDGE OF STROKE PROPHYLAXIS Description: Manage increase knowledge of stroke prohylaxis with minimal assistance from spouse and family members using educational materials provided Outcome: Progressing

## 2024-03-22 NOTE — Progress Notes (Signed)
 Speech Language Pathology Discharge Summary  Patient Details  Name: Samuel Warren MRN: [id number] Date of Birth: [birth year]  Date of Discharge from SLP service:Mar 22, 2024  Today's Date: 03/22/2024 SLP Individual Time: 1100-1140 SLP Individual Time Calculation (min): 40 min   Skilled Therapeutic Interventions:   Skilled therapy session focused on communication goals. Patient utilized picture board to locate items signed by interpreter with mod I A (both L and R sides of board). Patient answered orientation questions regarding name and location as able due to language barriers. SLP continued to educate patient on use of call bell using signs and communication board. Patient located and pressed call bell with mod I A. Patient endorses 10/10 headache and therefore remaining 20 minutes of session missed due to pain. Patient left in bed with alarm set and call bell in reach.    Patient has met 1 of 1 long term goals.  Patient to discharge at overall Modified Independent level.  Reasons goals not met: n/a   Clinical Impression/Discharge Summary:  Pt has made functional gains and has met 1 of 1 LTG's this admission due to improved ability to communicate with staff via picture board if no interpreter is present. Pt is currently overall mod I for use of communication board and is able to identify photos given signs from interpreter.  Pt/family education complete and pt will discharge home with supervision from friends/family/etc. No f/u ST required as patients language abilities are a difference rather than a deficit. Recommend patient continue to learn ASL/gestures to increase ability to communicate.   Care Partner:  Caregiver Able to Provide Assistance: Yes  Type of Caregiver Assistance: Physical  Recommendation:  None      Equipment: n/a   Reasons for discharge: Treatment goals met   Patient/Family Agrees with Progress Made and Goals Achieved: Yes    Lauraine Crespo M.A.,  CCC-SLP 03/22/2024, 11:47 AM

## 2024-03-23 ENCOUNTER — Inpatient Hospital Stay (HOSPITAL_COMMUNITY)

## 2024-03-23 LAB — PROTIME-INR
INR: 2.1 — ABNORMAL HIGH (ref 0.8–1.2)
Prothrombin Time: 24.2 s — ABNORMAL HIGH (ref 11.4–15.2)

## 2024-03-23 LAB — GLUCOSE, CAPILLARY: Glucose-Capillary: 105 mg/dL — ABNORMAL HIGH (ref 70–99)

## 2024-03-23 MED ORDER — WARFARIN SODIUM 3 MG PO TABS
3.0000 mg | ORAL_TABLET | Freq: Once | ORAL | Status: AC
  Administered 2024-03-23: 3 mg via ORAL
  Filled 2024-03-23: qty 1

## 2024-03-23 MED ORDER — IOHEXOL 350 MG/ML SOLN
75.0000 mL | Freq: Once | INTRAVENOUS | Status: AC | PRN
  Administered 2024-03-23: 75 mL via INTRAVENOUS

## 2024-03-23 MED ORDER — SODIUM CHLORIDE 0.9 % IV BOLUS
1000.0000 mL | Freq: Once | INTRAVENOUS | Status: AC
  Administered 2024-03-23: 1000 mL via INTRAVENOUS

## 2024-03-23 NOTE — Progress Notes (Signed)
 Occupational Therapy Session Note  Patient Details  Name: Samuel Warren MRN: [id number] Date of Birth: [birth year]  Today's Date: 03/23/2024 OT Individual Time: 0900-1000 & 1300-1404 OT Individual Time Calculation (min): 60 min & 64 min    Short Term Goals: Week 1:  OT Short Term Goal 1 (Week 1): Patient will complete LB dressing with min A OT Short Term Goal 2 (Week 1): Patient will complete toileting tasks with min A OT Short Term Goal 3 (Week 1): Patient will maintain dynamic standing balance iwth min A  Skilled Therapeutic Interventions/Progress Updates:      Therapy Documentation Precautions:  Precautions Precautions: Fall, Other (comment) Recall of Precautions/Restrictions: Intact Precaution/Restrictions Comments: R inattention Restrictions Weight Bearing Restrictions Per Provider Order: No Session 1 General: Pt supine in bed upon OT arrival, nodding for agreeing to OT session. ASL interpreter present  Pain: unrated pain reported in neck, nsg medicated pt, activity, intermittent rest breaks, distractions provided for pain management, pt reports tolerable to proceed.   ADL: OT providing skilled intervention on ADL retraining in order to increase independence with tasks and increase activity tolerance. Pt completed the following tasks at the current level of assist: Bed mobility: Min A overall, CGA supine>EOB with VC to manage Rt side, Mod-Max A for static sitting balance at EOB, Min A managing getting back into bed  Grooming/oral hygiene: SBA, multimodal for sequencing task for toothbrushing, pt able to wash face when given warm rag without VC UB dressing: Mod A, requiring multimodal cues for sequencing d/t confusion with rote task LB dressing: attempted to change pants while seated EOB and standing to manage over waist, pt unable to sequence task and gaining frustration, deferring task Footwear: total A to change into clean socks bed level d/t poor trunk control Transfers:  Min A overall for stand pivot transfer to W/C without AD, multimodal cuing required for hand placement and safety  Pt supine in bed with bed alarm activated, 2 bed rails up, call light within reach and 4Ps assessed.   Session 2 General:  Pt supine in bed upon OT arrival, nodding, agreeing to OT session. ASL interpreter present  Pain:  unrated pain reported in neck and Lt forearm, heat, massage, activity, intermittent rest breaks, distractions provided for pain management, pt reports tolerable to proceed.   ADL: OT providing skilled intervention on ADL retraining in order to increase independence with tasks and increase activity tolerance. Pt completed the following tasks at the current level of assist: Bed mobility: Min A overall, CGA supine>EOB with VC to manage Rt side, Mod-Max A for static sitting balance at EOB, Min A managing getting back into bed   Balance: OT providing skilled intervention for transfer training in order to increase transfers/ global strength/endurance. OT attempting to complete sit to stands from W/C><RW, able to complete 2x but pt having increasing trouble sequencing task requiring max multimodal cues for directions and OT noting Rt side inattention d/t heavy multimodal cueing to correct Rt lean and manage RUE into W/C. OT grading task by taking pt to // Bars. Pt completing 3x sit to stand with UE support and multimodal cues for hand placement and safety d/t when pt sitting in W/C, pt unable to correct severe Rt lean and severe posterior pelvic tilt and max multimodal cues.   Other Treatments: OT applied KT tape to posterior cervical region in order to provide stability and decrease pain with use. Pt educated on risks and benefits of tape through interpreter.    Pt supine  in bed with bed alarm activated, 2 bed rails up, call light within reach and 4Ps assessed.   Therapy/Group: Individual Therapy  Nila Barth, OTD, OTR/L 03/23/2024, 4:25 PM

## 2024-03-23 NOTE — Consult Note (Signed)
 NEUROLOGY CONSULT NOTE   Date of service: Mar 23, 2024 Patient Name: Samuel Warren MRN:  [id number] DOB:  1991-05-17 Chief Complaint: "right arm weakness" Requesting Provider: [name], MD  History of Present Illness  Samuel Warren is a 33 y.o. 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  He was initially admitted to the hospital in March, and was found at that time to have venous sinus thrombosis.  He was discharged on Eliquis .  He represented to the hospital on April 22 with worsening symptoms.  He had recurrent symptoms, and actually had an attempted thrombectomy on 4/29, however due to progression of the changes, it was no longer accessible as it had changed to chronic type situation.  He was treated with heparin  during that hospitalization and transitioned to Coumadin  and has been therapeutic with an INR of 2.1 this morning.  This evening, a code stroke was activated secondary to abrupt change with worsening of the right side.  After discussion with his brother who is at bedside, he has had many episodes where his right side suddenly becomes plegic and last for about 5 minutes.  He states that when the patient lies down he typically improves, but has been having fewer of the spells as time has gone on.  With his current episode, he was already in a recumbent position when it started.  There was no twitching, and has never been any twitching associated with these episodes.  He was taken downstairs and evaluated emergently as a code stroke.  Initially he had severe weakness of the right side, but by the time of returning to his room his right-sided weakness had essentially resolved.  LKW: 3/16 IV Thrombolysis: No, anticoagulation EVT: No, no LVO NIH stroke scale: 12  Past History   Past Medical History:  Diagnosis Date   Deaf     Past Surgical History:  Procedure Laterality Date   IR ANGIO INTRA  EXTRACRAN SEL COM CAROTID INNOMINATE BILAT MOD SED  02/02/2024   IR ANGIO INTRA EXTRACRAN SEL COM CAROTID INNOMINATE BILAT MOD SED  03/09/2024   IR CT HEAD LTD  03/09/2024   IR THROMBECT VENO MECH MOD SED  02/02/2024   IR THROMBECT VENO MECH MOD SED  03/09/2024   IR VENO SAGITTAL SINUS  03/09/2024   IR VENO/JUGULAR LEFT  02/02/2024   RADIOLOGY WITH ANESTHESIA N/A 02/02/2024   Procedure: RADIOLOGY WITH ANESTHESIA;  Surgeon: Luellen Sages, MD;  Location: [facility] OR;  Service: Radiology;  Laterality: N/A;   RADIOLOGY WITH ANESTHESIA N/A 03/09/2024   Procedure: RADIOLOGY WITH ANESTHESIA;  Surgeon: Luellen Sages, MD;  Location: [facility] OR;  Service: Radiology;  Laterality: N/A;    Family History: History reviewed. No pertinent family history.  Social History  reports that he has never smoked. He has never used smokeless tobacco. He reports that he does not currently use alcohol. He reports that he does not currently use drugs.  No Known Allergies  Medications   Current Facility-Administered Medications:    acetaminophen  (TYLENOL ) tablet 650 mg, 650 mg, Oral, Q4H PRN, 650 mg at 03/20/24 1006 **OR** acetaminophen  (TYLENOL ) 160 MG/5ML solution 650 mg, 650 mg, Per Tube, Q4H PRN **OR** acetaminophen  (TYLENOL ) suppository 650 mg, 650 mg, Rectal, Q4H PRN, [name], Daniel J, PA-C   alum & mag hydroxide-simeth (MAALOX/MYLANTA) 200-200-20 MG/5ML suspension 30 mL, 30 mL, Oral, Q4H PRN, [name], Daniel J, PA-C   amitriptyline (ELAVIL) tablet 25 mg, 25 mg, Oral, QHS, Lovorn,  Megan, MD, 25 mg at 03/23/24 2021   atorvastatin  (LIPITOR ) tablet 80 mg, 80 mg, Oral, Daily, [name], Daniel J, PA-C, 80 mg at 03/23/24 1610   butalbital -acetaminophen -caffeine  (FIORICET ) 50-325-40 MG per tablet 1 tablet, 1 tablet, Oral, Q4H PRN, Lovorn, Megan, MD, 1 tablet at 03/23/24 1120   chlorproMAZINE  (THORAZINE ) tablet 10 mg, 10 mg, Oral, TID PRN, [name], Daniel J, PA-C   divalproex  (DEPAKOTE ) DR tablet 500 mg, 500 mg, Oral, Q12H,  [name], Daniel J, PA-C, 500 mg at 03/23/24 2021   feeding supplement (BOOST / RESOURCE BREEZE) liquid 1 Container, 1 Container, Oral, TID BM, [name], Daniel J, PA-C, 1 Container at 03/23/24 1303   lidocaine  (LIDODERM ) 5 % 2 patch, 2 patch, Transdermal, Q24H, Lovorn, Megan, MD, 2 patch at 03/23/24 2022   methocarbamol  (ROBAXIN ) tablet 500 mg, 500 mg, Oral, Daily, Lylia Sand, MD, 500 mg at 03/23/24 0940   methocarbamol  (ROBAXIN ) tablet 750 mg, 750 mg, Oral, Q6H PRN, Lovorn, Megan, MD, 750 mg at 03/22/24 1355   multivitamin with minerals tablet 1 tablet, 1 tablet, Oral, Daily, [name], Daniel J, PA-C, 1 tablet at 03/23/24 9604   ondansetron  (ZOFRAN ) tablet 4 mg, 4 mg, Oral, Q6H PRN, [name], Daniel J, PA-C   Oral care mouth rinse, 15 mL, Mouth Rinse, PRN, [name], Daniel J, PA-C   pantoprazole  (PROTONIX ) EC tablet 40 mg, 40 mg, Oral, Daily, [name], Daniel J, PA-C, 40 mg at 03/23/24 5409   polyethylene glycol (MIRALAX  / GLYCOLAX ) packet 17 g, 17 g, Oral, BID, [name], Daniel J, PA-C, 17 g at 03/23/24 8119   senna (SENOKOT) tablet 8.6 mg, 1 tablet, Oral, BID, [name], [name], PA-C, 8.6 mg at 03/23/24 2021   sodium chloride  tablet 2 g, 2 g, Oral, TID WC, [name], [name], PA-C, 2 g at 03/23/24 1705   sorbitol 70 % solution 30 mL, 30 mL, Oral, Daily PRN, Lovorn, Megan, MD   Warfarin - Pharmacist Dosing Inpatient, , Does not apply, q1600, Sterling Eisenmenger, PA-C, Given at 03/23/24 1705  Vitals   Vitals:   03/23/24 0422 03/23/24 1502 03/23/24 1938 03/23/24 2053  BP: 101/62 97/65 96/81  100/78  Pulse: (!) 56 71 94 71  Resp: 17 16 18 18   Temp: 97.9 F (36.6 C) 98.1 F (36.7 C) 98.7 F (37.1 C) 98 F (36.7 C)  TempSrc:  Oral    SpO2: 99% 98% 95% 99%  Weight:      Height:        Body mass index is 24.71 kg/m.  Physical Exam   Constitutional: Appears well-developed and well-nourished.   Neurologic Examination   There is a significant language barrier Neuro: Mental  Status: Patient's brother indicates that he is signing normally, and does not seem aphasic.  The patient is awake, engages, and can mimic commands easily. Cranial Nerves: II: Blinks to threat bilaterally. Pupils are equal, round, and reactive to light.   III,IV, VI: EOMI without ptosis or diploplia.  V: Facial sensation is symmetric to temperature VII: Facial movement with right facial weakness initially, resolved subsequently VIII: hearing is intact to voice X: Uvula elevates symmetrically XII: tongue is midline without atrophy or fasciculations.  Motor: Initially he was essentially plegic in the right arm and severely paretic in the right leg, but this improved over the course of the exam and by the time we returned to the room he had no drift in either. Sensory: He flinched less on the right than left Cerebellar: No clear ataxia on finger-nose-finger on the left  Labs/Imaging/Neurodiagnostic studies   CBC:  Recent Labs  Lab 04/04/2024 0740 03/22/24 0621  WBC 6.8 5.8  NEUTROABS 4.2  --   HGB 11.9* 12.3*  HCT 36.0* 38.5*  MCV 81.1 83.7  PLT 223 270   Basic Metabolic Panel:  Lab Results  Component Value Date   NA 142 04-04-2024   K 3.5 2024/04/04   CO2 22 04-04-2024   GLUCOSE 104 (H) Apr 04, 2024   BUN 14 2024-04-04   CREATININE 0.96 04/04/24   CALCIUM  9.2 04/04/2024   GFRNONAA >60 Apr 04, 2024   Lipid Panel:  Lab Results  Component Value Date   LDLCALC 58 03/08/2024   HgbA1c:  Lab Results  Component Value Date   HGBA1C 5.7 (H) 01/26/2024   Urine Drug Screen:     Component Value Date/Time   LABOPIA NONE DETECTED 02/02/2024 0852   COCAINSCRNUR NONE DETECTED 02/02/2024 0852   LABBENZ NONE DETECTED 02/02/2024 0852   AMPHETMU NONE DETECTED 02/02/2024 0852   THCU NONE DETECTED 02/02/2024 0852   LABBARB NONE DETECTED 02/02/2024 0852    Alcohol Level     Component Value Date/Time   [facility] <15 03/08/2024 1829   INR  Lab Results  Component Value Date    INR 2.1 (H) 03/23/2024   APTT  Lab Results  Component Value Date   APTT 41 (H) 03/18/2024   CT Head without contrast(Personally reviewed): Stable  CT angio Head and Neck with contrast(Personally reviewed): Stable   ASSESSMENT   Lunsford Benner is a 33 y.o. male with right sided motor fluctuations in the setting of severe venous sinus thrombosis.  Acute interventions such as transfer to the ICU for hypertonic saline, repeated attempts at thrombectomy, changes in anticoagulation, etc. were considered but at the current time with his improved symptoms my suspicion that any of these would help him is very low.  Possible etiologies include partial seizures without clonic component, or transient hypoperfusion of a specific area in the setting of his venous sinus thrombosis.  The description of the brother of orthostatic related right-sided weakness that has been ongoing and occurred many (he states many more than 10) times since his started would be suggestive of the latter.  I do think overnight EEG to look for seizures would be a reasonable next step.  His blood pressures were slightly on the softer side, and I did order a normal saline fluid bolus.  RECOMMENDATIONS  Normal saline 1 L x 1 CTA head and neck was originally recommended and is performed at the time of finalizing this note Overnight EEG Stroke team will follow ______________________________________________________________________    Signed, Ann Keto, MD [facility]

## 2024-03-23 NOTE — Progress Notes (Signed)
 Patient ID: Samuel Warren, male   DOB: 21-Jul-1991, 33 y.o.   MRN: [id number]  Spoke with brother-Samuel Warren via telephone to inform him of team conference goals of supervision level and discharge date of 5/28. He asked if he would need the lift we are currently using with him. Discussed hopefully no and will meet again next week to discuss progress and DME needs. Discussed family training prior to discharge and he will inform the family and pt when here later today. He did talk with the MD yesterday afternoon which he found helpful. Continue to work on discharge needs.

## 2024-03-23 NOTE — Progress Notes (Signed)
 Responded to code stroke. Rapid response/staff nurse confirm presence of IV access. IV team available for any further needs.

## 2024-03-23 NOTE — Progress Notes (Signed)
 Patient is having terrible pain in the back of his head. Patient will not lie on back so EEG can be placed. Asked RN for pain medication. She's going to see if she can get medication. EEG may have to wait until am shift.

## 2024-03-23 NOTE — Progress Notes (Signed)
 PROGRESS NOTE   Subjective/Complaints:  LBM 2 days ago- abd fullness, but not pain still.    Pt reports HA somewhat better- rates as ~5/10 not 10/10 anymore.    ROS: Limited by pt's deafness  Objective:   No results found.  Recent Labs    03/22/24 0621  WBC 5.8  HGB 12.3*  HCT 38.5*  PLT 270   No results for input(s): "NA", "K", "CL", "CO2", "GLUCOSE", "BUN", "CREATININE", "CALCIUM " in the last 72 hours.   Intake/Output Summary (Last 24 hours) at 03/23/2024 0849 Last data filed at 03/22/2024 1300 Gross per 24 hour  Intake 360 ml  Output --  Net 360 ml         Physical Exam: Vital Signs Blood pressure 101/62, pulse (!) 56, temperature 97.9 F (36.6 C), resp. rate 17, height 5\' 4"  (1.626 m), weight 65.3 kg, SpO2 99%.      General: awake, alert, appropriate, sitting up in bed; alone- eating breakfast; NAD HENT: conjugate gaze; oropharynx moist CV: regular rhythm with bradycardic rate; no JVD Pulmonary: CTA B/L; no W/R/R- good air movement GI: soft, NT, ND, (+)BS- slightly hypoactive Psychiatric: appropriate- flat affect Neurological: alert- eating breakfast  deaf- speaks [place]- hard to assess cognition MSK- neck tightness  in upper traps, splenius capitus and levators and scalenes- much looser this AM still  Musculoskeletal:        General: Normal range of motion.     Cervical back: Neck supple. No tenderness.     Comments:  Basically 5-5/5 in all 4 extremities- but hard to test all muscles due to deafness and language barrier  Skin:    General: Skin is warm and dry.     Comments:  IV out of neck is gone- wounds healing  Neurological:     Mental Status: He is alert.     Comments: [name] makes eye contact with examiner.  Exam limited by language barrier.  Follows simple demonstrated commands.  His brother who was in the room does help to translate. Mild ataxia Mild R inattention Mild ot  moderate searching pattern on B/L finger to nose   Assessment/Plan: 1. Functional deficits which require 3+ hours per day of interdisciplinary therapy in a comprehensive inpatient rehab setting. Physiatrist is providing close team supervision and 24 hour management of active medical problems listed below. Physiatrist and rehab team continue to assess barriers to discharge/monitor patient progress toward functional and medical goals  Care Tool:  Bathing    Body parts bathed by patient: Chest, Abdomen, Front perineal area, Buttocks, Right upper leg, Left upper leg, Right lower leg, Left lower leg, Face, Right arm, Left arm         Bathing assist Assist Level: Moderate Assistance - Patient 50 - 74%     Upper Body Dressing/Undressing Upper body dressing   What is the patient wearing?: Pull over shirt    Upper body assist Assist Level: Minimal Assistance - Patient > 75%    Lower Body Dressing/Undressing Lower body dressing      What is the patient wearing?: Pants     Lower body assist Assist for lower body dressing: Maximal Assistance - Patient  25 - 49%     Toileting Toileting    Toileting assist Assist for toileting: Maximal Assistance - Patient 25 - 49%     Transfers Chair/bed transfer  Transfers assist     Chair/bed transfer assist level: Moderate Assistance - Patient 50 - 74%     Locomotion Ambulation   Ambulation assist      Assist level: Moderate Assistance - Patient 50 - 74% Assistive device: Hand held assist Max distance: 60   Walk 10 feet activity   Assist     Assist level: Moderate Assistance - Patient - 50 - 74% Assistive device: Hand held assist   Walk 50 feet activity   Assist Walk 50 feet with 2 turns activity did not occur: Safety/medical concerns  Assist level: Moderate Assistance - Patient - 50 - 74% Assistive device: Hand held assist    Walk 150 feet activity   Assist Walk 150 feet activity did not occur: Safety/medical  concerns         Walk 10 feet on uneven surface  activity   Assist Walk 10 feet on uneven surfaces activity did not occur: Safety/medical concerns         Wheelchair     Assist Is the patient using a wheelchair?: Yes Type of Wheelchair: Manual    Wheelchair assist level: Minimal Assistance - Patient > 75% Max wheelchair distance: 25    Wheelchair 50 feet with 2 turns activity    Assist        Assist Level: Moderate Assistance - Patient 50 - 74%   Wheelchair 150 feet activity     Assist      Assist Level: Maximal Assistance - Patient 25 - 49%   Blood pressure 101/62, pulse (!) 56, temperature 97.9 F (36.6 C), resp. rate 17, height 5\' 4"  (1.626 m), weight 65.3 kg, SpO2 99%.  Medical Problem List and Plan: 1. Functional deficits secondary to persistent dural venous sinus thrombosis             -patient may  shower             -ELOS/Goals: min A- 18-21 days  Con't CIR PT, OT and SLP  HA down to 5/10 per pt  Team conference today to determine length of stay 2.  Antithrombotics: -DVT/anticoagulation:  Pharmaceutical: Coumadin  5/9- Last INR 2.3- managed by pharmacy 5/13- INR 2.1             -antiplatelet therapy: N/A 3. Pain Management: Fioricet  as needed for refractory headaches, Tylenol  as needed  5/9- having HA's- they attempted Topamax - might need to try Elavil- if doesn't improve by tomorrow, will add Elavil 25 mg at bedtime  5/10- started Elavil 25 mg at bedtime- and lidoderm  patches- 2 patches 8pm to 8am- increased Fioricet  to q4 hours prn- cannot have Topamax  due to low Bicarb levels  5/11- HA somewhat better this AM per son by phone- pt continues to point at 10/10- but muscles in neck much looser  5/13- HA 5/10 this AM- might benefit from TrP Injections but hard to assess consent? 4. Mood/Behavior/Sleep: Provide emotional support             -antipsychotic agents: N/A 5. Neuropsych/cognition: This patient is? capable of making decisions on  his own behalf. 6. Skin/Wound Care: Routine skin checks 7. Fluids/Electrolytes/Nutrition: Routine in and outs with follow-up chemistries 8.  Cerebral edema.  Hypertonic saline discontinued 9.  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  Status post IR for  thrombectomy was discharged on Eliquis - now changed to Coumadin  10.  History of stroke/recent viral meningitis.  Completed course of Doxy 11.  Refractory headaches.  Medrol  Dosepak.  Topamax  discontinued due to low bicarb- might need to start Elavil, since HA's still 6/10  5/9- still having constant HA's  5/10- pain up to 10/10 today- got head CT due to 10/10 HA- looks OK- no changes- reexamined pt's neck- very tight musculature- will order Robaxin  750 mg q6 hours prn for neck/posterior HA's- as well as lidocaine  patches nightly- added Elavil 25 mg at bedtime for prevention- and asked therapy to do myofascial release for pt.   5/11- muscles looser- HA appears to be less- appears more comfortable  5/12 not using robaxin  frequently, will schedule AM dose. family indicates headaches/neck pain overall improving  5/13- HA down to 5/10 this AM 12.  Hyperlipidemia.  Lipitor  13.  Congenital Deafness.  Patient does use some sign language- but mainly gestures per ASL interpretor 14.  GERD.  Protonix   15. Abd pain  5/9- pt clear having constant abd pain- will get CT of abd/pelvis with contrast to assess abd pain   5/10- Abd CT looks good  -LBM 3 days ago- ordered Sorbitol for constipation  5/11- LBM yesterday  5/12 LBM documented yesterday  5/13- LBM 2 days ago- if no BM by tomorrow, will intervene 16. Anemia. Mild. -5/12 HGB 12.3, stable continue to monitor    I spent a total of  35  minutes on total care today- >50% coordination of care- due to  D/w pt and nursing about pt overnight- also team conference to determine length of stay  LOS: 5 days A FACE TO FACE EVALUATION WAS PERFORMED  Brittni Hult 03/23/2024, 8:49 AM

## 2024-03-23 NOTE — Progress Notes (Signed)
 Physical Therapy Session Note  Patient Details  Name: Samuel Warren MRN: [id number] Date of Birth: 08-Feb-1991  Today's Date: 03/23/2024 PT Individual Time: 4098-1191 PT Individual Time Calculation (min): 24 min  Today's Date: 03/23/2024 PT Missed Time: 36 Minutes Missed Time Reason: Pain  Short Term Goals: Week 1:  PT Short Term Goal 1 (Week 1): Pt will transfer sup to sit w/ supervision consistently. PT Short Term Goal 2 (Week 1): Pt will improve sitting balance w/ supervision x 5' PT Short Term Goal 3 (Week 1): Pt will transfer sit to stand w/ min A consistently. PT Short Term Goal 4 (Week 1): Pt will amb w/ min A x 50'  Skilled Therapeutic Interventions/Progress Updates:   Received pt sidelying in bed asleep with wife at bedside (does not speak [place]). Upon wakening, pt indicating severe head and abdominal pain - RN notified and present to administer medication. Offered and placed heat packs along posterior neck for relief and assisted with repositioning in bed for comfort. Suggested getting OOB, however pt refused due to pain.   Offered crackers but pt refused and increased time spent attempting to determine if pt ate breakfast this morning. Discussed with supervisor ordering visual food chart for ease communicating when ordering meals in the future. Offered again getting OOB, but pt continued to shake head "no", pointed to head, and grimaced. Concluded session with pt sidelying in bed, needs within reach, and bed alarm on. Increased time spent during session communicating and offering various suggestions to encourage OOB mobility. Per interpreter, pt does not have full understanding of ASL and tends to communicate mostly through gestures. 36 minutes missed of skilled physical therapy due to pain.   Therapy Documentation Precautions:  Precautions Precautions: Fall, Other (comment) Recall of Precautions/Restrictions: Intact Precaution/Restrictions Comments: R  inattention Restrictions Weight Bearing Restrictions Per Provider Order: No  Therapy/Group: Individual Therapy [name] Nena Bank PT, DPT 03/23/2024, 7:12 AM

## 2024-03-23 NOTE — Progress Notes (Signed)
 LTM EEG hooked up and running - no initial skin breakdown - push button tested - [facility] monitoring.

## 2024-03-23 NOTE — Code Documentation (Signed)
 Responded to Code Stroke called on pt for R arm paralysis, R leg weakness, and facial droop. Per pt's brother, pt was talking on the phone using his R arm when pt laid phone down and then could not move his R arm. [id number]. NIH-12, SBP-100. Exam complicated because pt speaks a different language and is also deaf. Pt taken to CT for CT head/CTA/CTP-negative for hemorrhage/no LVO. 1L NS started while in CT. Once back in pt's room, deficits resolved. Plan: finish 1L NS bolus, Cont overnight EEG, stroke swallow screen prior to reordering diet.

## 2024-03-23 NOTE — Progress Notes (Signed)
 PHARMACY - ANTICOAGULATION CONSULT NOTE  Pharmacy Consult for Warfarin Indication:  central venous sinus thrombosis  No Known Allergies  Patient Measurements: Height: 5\' 4"  (162.6 cm) Weight: 65.3 kg (143 lb 15.4 oz) IBW/kg (Calculated) : 59.2 HEPARIN  DW (KG): 65.3  Vital Signs: Temp: 97.9 F (36.6 C) (05/13 0422) BP: 101/62 (05/13 0422) Pulse Rate: 56 (05/13 0422)  Labs: Recent Labs    03/21/24 0855 03/22/24 0621 03/23/24 0532  HGB  --  12.3*  --   HCT  --  38.5*  --   PLT  --  270  --   LABPROT 24.0* 23.9* 24.2*  INR 2.1* 2.1* 2.1*    Estimated Creatinine Clearance: 90.8 mL/min (by C-G formula based on SCr of 0.96 mg/dL).  Assessment: 30 YOM with history of central venous sinus thrombosis on Eliquis  PTA. He returned with new onset of R-sided weakness and cerebral edema.  Patient was initially on IV heparin , then transitioned back to PTA Eliquis .  Pharmacy consulted to transition patient to IV heparin  bridge to Warfarin starting 5/3. IV heparin  stopped on 5/6 with therapeutic INR and currently on Warfarin only.   Warfarin dose held 5/6 when INR trended 2.0>2.9, then INR trended down 5/7 and 5/8.    5/13: Update: INR = 2.1 remains therapeutic.  On 5/12 CBC:  Hgb 12.3 stable and pltc 270 wnl /stable.  No bleeding reported.   Based on warfarin dosing and INR results over past 10 days, I expect patient may need approximately 3mg  daily to keep INR 2-3.  We will continue to order warfarin each day for now, observing INR trend and stablility. Then determine a maintenance daily dose as soon as possible.   Patient is eating anywhere from 10-100% of meals with last charted meals on 5/9 at 100%   Potential drug interactions with warfarin are divalproex  (may increase warfarin effect) and butalbital  (in Fioricet ) which can decrease warfarin effect.   Goal of Therapy:  INR 2-3 Monitor platelets by anticoagulation protocol: Yes   Plan:  Warfarin 3 mg x 1 today. Daily  PT/INR Monitor for signs/symptoms of bleeding. Follow CBC every Monday and Thursday for now and plan to reduce checks if stable.   Thank you for allowing pharmacy to be part of this patients care team. Alisa Irish, RPh Clinical Pharmacist [phone] until 3:30 PM then Please check AMION for all [facility] Pharmacy phone numbers After 10:0 PM, call Main Pharmacy [phone]  03/23/2024 1:54 PM

## 2024-03-23 NOTE — Progress Notes (Signed)
 Lenon Radar LPN called, reporting: At 7:30 patients was assisted to bedside commode with two assist, no distress noted. At 8:20 patients brother told Lenon Radar patient stated he couldn't move his right arm. Vitals B/P 96/81 P:94: Code [place] activated.  Lenon Radar will speak with patient with LAS line and call provider with update. She verbalizes understanding.

## 2024-03-24 ENCOUNTER — Inpatient Hospital Stay (HOSPITAL_COMMUNITY)

## 2024-03-24 DIAGNOSIS — I959 Hypotension, unspecified: Secondary | ICD-10-CM

## 2024-03-24 DIAGNOSIS — R569 Unspecified convulsions: Secondary | ICD-10-CM

## 2024-03-24 DIAGNOSIS — E785 Hyperlipidemia, unspecified: Secondary | ICD-10-CM

## 2024-03-24 DIAGNOSIS — R531 Weakness: Secondary | ICD-10-CM

## 2024-03-24 LAB — COMPREHENSIVE METABOLIC PANEL WITH GFR
ALT: 44 U/L (ref 0–44)
AST: 20 U/L (ref 15–41)
Albumin: 3.1 g/dL — ABNORMAL LOW (ref 3.5–5.0)
Alkaline Phosphatase: 40 U/L (ref 38–126)
Anion gap: 5 (ref 5–15)
BUN: 5 mg/dL — ABNORMAL LOW (ref 6–20)
CO2: 21 mmol/L — ABNORMAL LOW (ref 22–32)
Calcium: 8 mg/dL — ABNORMAL LOW (ref 8.9–10.3)
Chloride: 116 mmol/L — ABNORMAL HIGH (ref 98–111)
Creatinine, Ser: 0.81 mg/dL (ref 0.61–1.24)
GFR, Estimated: >60 mL/min (ref >60)
Glucose, Bld: 131 mg/dL — ABNORMAL HIGH (ref 70–99)
Potassium: 3.2 mmol/L — ABNORMAL LOW (ref 3.5–5.1)
Sodium: 142 mmol/L (ref 135–145)
Total Bilirubin: 0.5 mg/dL (ref 0.0–1.2)
Total Protein: 6 g/dL — ABNORMAL LOW (ref 6.5–8.1)

## 2024-03-24 LAB — CBC
HCT: 35 % — ABNORMAL LOW (ref 39.0–52.0)
Hemoglobin: 10.9 g/dL — ABNORMAL LOW (ref 13.0–17.0)
MCH: 26.3 pg (ref 26.0–34.0)
MCHC: 31.1 g/dL (ref 30.0–36.0)
MCV: 84.5 fL (ref 80.0–100.0)
Platelets: 232 10*3/uL (ref 150–400)
RBC: 4.14 MIL/uL — ABNORMAL LOW (ref 4.22–5.81)
RDW: 12.9 % (ref 11.5–15.5)
WBC: 4.3 10*3/uL (ref 4.0–10.5)
nRBC: 0 % (ref 0.0–0.2)

## 2024-03-24 LAB — VALPROIC ACID LEVEL: Valproic Acid Lvl: 26 ug/mL — ABNORMAL LOW (ref 50–100)

## 2024-03-24 LAB — PROTIME-INR
INR: 2.2 — ABNORMAL HIGH (ref 0.8–1.2)
Prothrombin Time: 24.7 s — ABNORMAL HIGH (ref 11.4–15.2)

## 2024-03-24 MED ORDER — WARFARIN SODIUM 3 MG PO TABS
3.0000 mg | ORAL_TABLET | Freq: Once | ORAL | Status: AC
  Administered 2024-03-24: 3 mg via ORAL
  Filled 2024-03-24: qty 1

## 2024-03-24 MED ORDER — SODIUM CHLORIDE 0.9 % IV SOLN: INTRAVENOUS | Status: AC

## 2024-03-24 MED ORDER — DIVALPROEX SODIUM 500 MG PO DR TAB
750.0000 mg | DELAYED_RELEASE_TABLET | Freq: Two times a day (BID) | ORAL | Status: DC
  Administered 2024-03-24 – 2024-03-26 (×5): 750 mg via ORAL
  Filled 2024-03-24 (×6): qty 1

## 2024-03-24 MED ORDER — MIDODRINE HCL 5 MG PO TABS
5.0000 mg | ORAL_TABLET | Freq: Three times a day (TID) | ORAL | Status: DC
  Administered 2024-03-24 – 2024-03-25 (×3): 5 mg via ORAL
  Filled 2024-03-24 (×3): qty 1

## 2024-03-24 MED ORDER — POTASSIUM CHLORIDE CRYS ER 20 MEQ PO TBCR
40.0000 meq | EXTENDED_RELEASE_TABLET | ORAL | Status: AC
  Administered 2024-03-24 (×2): 40 meq via ORAL
  Filled 2024-03-24 (×2): qty 2

## 2024-03-24 NOTE — Progress Notes (Signed)
 PHARMACY - ANTICOAGULATION CONSULT NOTE  Pharmacy Consult for Warfarin Indication:  central venous sinus thrombosis  No Known Allergies  Patient Measurements: Height: 5\' 4"  (162.6 cm) Weight: 65.3 kg (143 lb 15.4 oz) IBW/kg (Calculated) : 59.2 HEPARIN  DW (KG): 65.3  Vital Signs: Temp: 98 F (36.7 C) (05/14 0454) Temp Source: Oral (05/14 0454) BP: 107/71 (05/14 0454) Pulse Rate: 78 (05/14 0454)  Labs: Recent Labs    03/22/24 0621 03/23/24 0532 03/24/24 0516  HGB 12.3*  --   --   HCT 38.5*  --   --   PLT 270  --   --   LABPROT 23.9* 24.2* 24.7*  INR 2.1* 2.1* 2.2*    Estimated Creatinine Clearance: 90.8 mL/min (by C-G formula based on SCr of 0.96 mg/dL).  Assessment: 6 YOM with history of central venous sinus thrombosis on Eliquis  PTA. He returned with new onset of R-sided weakness and cerebral edema.  Patient was initially on IV heparin , then transitioned back to PTA Eliquis .  Pharmacy consulted to transition patient to IV heparin  bridge to Warfarin starting 5/3. IV heparin  stopped on 5/6 with therapeutic INR and currently on Warfarin only.     5/14: Update: INR = 2.2 remains therapeutic.  On 5/12 CBC:  Hgb 12.3 stable and pltc 270 wnl /stable.  No bleeding reported.   Based on warfarin dosing and INR results, I expect patient may need approximately 3mg  daily to keep INR 2-3.  We will continue to order warfarin each day for now, observing INR trend and stablility. Then determine a maintenance daily dose.   Patient is eating 85-100% of meals with last charted meals on 5/14.    Potential drug interactions with warfarin are divalproex  (may increase warfarin effect) and butalbital  (in Fioricet ) which can decrease warfarin effect.    On 5/13 PM, Code Stroke called on pt for R arm paralysis, R leg weakness, and facial droop. CT head/CTA/CTP-negative for  hemorrhage/no LVO  Neurologist is increasing divalproex  dose to 750mg  q12h (5/14).   Goal of Therapy:  INR  2-3 Monitor platelets by anticoagulation protocol: Yes   Plan:  Warfarin 3 mg x 1 today. Daily PT/INR Monitor for signs/symptoms of bleeding. Follow CBC every Monday and Thursday for now and plan to reduce checks if stable.   Thank you for allowing pharmacy to be part of this patients care team. Alisa Irish, RPh Clinical Pharmacist [phone] until 3:30 PM then Please check AMION for all [facility] phone numbers After 10:0 PM, call Main Pharmacy [phone]  03/24/2024 11:20 AM

## 2024-03-24 NOTE — Progress Notes (Signed)
 Upon entering patient's room approximately at 2020 patients brother states that the patient cannot move his right arm. Patient assessed. Patient not showing any signs of discomfort or distress. Patient unable to move his right arm and his right leg. Vital signs and POC glucose are within normal parameters. On-call NP made aware. Code stroke called. Code stroke RN at bedside.

## 2024-03-24 NOTE — Procedures (Addendum)
 Patient Name: Samuel Warren  MRN: [id number]  Epilepsy Attending: Arleene Lack  Referring Physician/Provider: Augustin Leber, MD  Duration: 03/23/2024 2231 to 03/24/2024 0813  Patient history: 33 y.o. male with right sided motor fluctuations in the setting of severe venous sinus thrombosis. EEG to evaluate for seizure  Level of alertness: Awake, asleep  AEDs during EEG study: VPA  Technical aspects: This EEG study was done with scalp electrodes positioned according to the 10-20 International system of electrode placement. Electrical activity was reviewed with band pass filter of 1-70Hz , sensitivity of 7 uV/mm, display speed of 75mm/sec with a 60Hz  notched filter applied as appropriate. EEG data were recorded continuously and digitally stored.  Video monitoring was available and reviewed as appropriate.  Description: The posterior dominant rhythm consists of 8 Hz activity of moderate voltage (25-35 uV) seen predominantly in posterior head regions, symmetric and reactive to eye opening and eye closing. Sleep was characterized by vertex waves, sleep spindles (12 to 14 Hz), maximal frontocentral region. EEG initially showed continuous 3 to 6 Hz theta-delta slowing in left hemisphere, maximal left fronto-temporal region which gradually improved and was noted intermittently. Hyperventilation and photic stimulation were not performed.     ABNORMALITY - Continuous slow, left hemisphere, maximal left fronto-temporal region   IMPRESSION: This study is suggestive of cortical dysfunction arising from left hemisphere, maximal left fronto-temporal region likely secondary to underlying structural abnormality. No seizures or epileptiform discharges were seen throughout the recording.  Tharun Cappella O Deckard Stuber

## 2024-03-24 NOTE — Progress Notes (Signed)
 Attempted orthostatic vitals. Pt was unable to tolerate. Pt sat on EOB with max assist +2  Lying BP was 98/77 and sitting on EOB BP was 109/79. All pt extremities did not have enough strength to come to a standing position. Grips/Flexions were present but weak. Once back to lying position pt was able to move extremities with a moderate strength.

## 2024-03-24 NOTE — Progress Notes (Signed)
 Occupational Therapy Weekly Progress Note  Patient Details  Name: Samuel Warren MRN: [id number] Date of Birth: 1991-07-10  Beginning of progress report period: Mar 19, 2024 End of progress report period: Mar 25, 2024  Patient has met 0 of 3 short term goals. Pt has had limited progress in therapy time d/t medical complications, increased level of assist, increased fatigue and pain. Therapy barriers also including language barrier. Pt still presenting with Rt side inattention, poor trunk control, global weakness and poor endurance/activity tolerance.  Patient continues to demonstrate the following deficits: muscle weakness, decreased cardiorespiratoy endurance, and decreased sitting balance, decreased standing balance, decreased postural control, hemiplegia, and decreased balance strategies and therefore will continue to benefit from skilled OT intervention to enhance overall performance with BADL and Reduce care partner burden.  Patient progressing toward long term goals.  Continue plan of care.  OT Short Term Goals Week 1:  OT Short Term Goal 1 (Week 1): Patient will complete LB dressing with min A OT Short Term Goal 1 - Progress (Week 1): Revised due to lack of progress OT Short Term Goal 2 (Week 1): Patient will complete toileting tasks with min A OT Short Term Goal 2 - Progress (Week 1): Revised due to lack of progress OT Short Term Goal 3 (Week 1): Patient will maintain dynamic standing balance iwth min A OT Short Term Goal 3 - Progress (Week 1): Revised due to lack of progress Week 2:  OT Short Term Goal 1 (Week 2): Patient will complete LB dressing with mod A OT Short Term Goal 2 (Week 2): Patient will maintain static sitting balance with min A OT Short Term Goal 3 (Week 2): Patient will complete toileting tasks with mod A  Skilled Therapeutic Interventions/Progress Updates:      Therapy Documentation Precautions:  Precautions Precautions: Fall, Other (comment) Recall of  Precautions/Restrictions: Intact Precaution/Restrictions Comments: R inattention Restrictions Weight Bearing Restrictions Per Provider Order: No  Nila Barth, OTD, OTR/L 03/25/2024, 9:48 AM

## 2024-03-24 NOTE — Progress Notes (Signed)
 LTM EEG discontinued - no skin breakdown at [facility]. Performed by [facility] [employer]

## 2024-03-24 NOTE — Progress Notes (Signed)
 Occupational Therapy Session Note  Patient Details  Name: Samuel Warren MRN: [id number] Date of Birth: 1991-01-13  Today's Date: 03/24/2024 OT Individual Time: 1300-1400 OT Individual Time Calculation (min): 60 min  and Today's Date: 03/24/2024 OT Missed Time: 60 min;15 Minutes Missed Time Reason: see flowsheets   Short Term Goals: Week 1:  OT Short Term Goal 1 (Week 1): Patient will complete LB dressing with min A OT Short Term Goal 1 - Progress (Week 1): Revised due to lack of progress OT Short Term Goal 2 (Week 1): Patient will complete toileting tasks with min A OT Short Term Goal 2 - Progress (Week 1): Revised due to lack of progress OT Short Term Goal 3 (Week 1): Patient will maintain dynamic standing balance iwth min A OT Short Term Goal 3 - Progress (Week 1): Revised due to lack of progress  Skilled Therapeutic Interventions/Progress Updates:      Therapy Documentation Precautions:  Precautions Precautions: Fall, Other (comment) Recall of Precautions/Restrictions: Intact Precaution/Restrictions Comments: R inattention Restrictions Weight Bearing Restrictions Per Provider Order: No Session 1: Missed session d/t extreme fatigue, unable to wake even with wife's assistance, will attempt to make up missed minutes as able   Session 2 General:  Pt supine in bed upon OT arrival, nodding, agreeing to OT session. Wife and daughter present during session. OT messaging MD before session for okay to keep therapies, MD verified okay to complete therapies.  Vital Signs: OT and nsg assessing orthostatic vitals, assessed supine and seated EOB, not standing d/t safety. Vitals as follows: Supine:98/77 EOB: 109/74 Supine:100/74  Pain: pt expressing slight pain in neck through gesturing   ADL: OT providing skilled intervention on ADL retraining in order to increase independence with tasks and increase activity tolerance. Pt completed the following tasks at the current level of  assist: Bed mobility: Max A to supine> EOB to maintain sitting for vitals, total A+2 for managing back into bed, Max A rolling Lt and Rt Grooming: SBA supine in bed   LB dressing: total A bed level, pt attempting to assist to pull up, OT attempted to teach pt to bridge to manage pants over waist, unable to clear buttocks off of bed, pt rolling Lt and Rt at Max A to don pants over waist  Exercises: Pt completed the following exercise circuit in order to improve functional activity, strength and endurance to prepare for ADLs such as bathing. Pt completed the following exercises in supine position with no noted LOB/SOB and 3x10 repetitions on each exercise and heavy multimodal cueing for direction and form and PRN rest breaks: -bicep curls with 4# dowel rod -triceps extensions with red theraband -shoulder abduction with red theraband -forward punches with red theraband -chest press with 4# dowel rod, OT assisting with AAROM for form and d/t weakness  -shoulder flexion with red theraband  Pt signed "finished" after exercises d/t fatigue.    Pt supine in bed with bed alarm activated, 2 bed rails up, call light within reach and 4Ps assessed.   Therapy/Group: Individual Therapy  Nila Barth, OTD, OTR/L 03/24/2024, 4:04 PM

## 2024-03-24 NOTE — Progress Notes (Signed)
 Patient now able to move all 4 extremities. Vitals are within normal parameters. Patient not showing any signs of discomfort or distress. On-call NP made aware.

## 2024-03-24 NOTE — Progress Notes (Signed)
 PROGRESS NOTE   Subjective/Complaints:  Per brother and nursing, pt had episode last night for 5 minutes where RUE/RLE flaccid- no movement- however improved by the time Neuro had finished examining him- CT  and angio was (-) for anything progressing.  Also getting EEG this AM- delayed last night since back of head really hurting.  Per chart review.   Pt reports strength back to normal, but brother notes he has this occur frequently- up to 10+ times has occurred, but usually sitting up when it occurs.     ROS: Limited by pt being deaf- doesn't speak english/sign a lot of ASL  Objective:   CT ANGIO HEAD NECK W WO CM (CODE STROKE) Result Date: 03/23/2024 CLINICAL DATA:  Acute neurologic deficit. Dural venous sinus thrombosis. EXAM: CT ANGIOGRAPHY HEAD AND NECK WITH AND WITHOUT CONTRAST TECHNIQUE: Multidetector CT imaging of the head and neck was performed using the standard protocol during bolus administration of intravenous contrast. Multiplanar CT image reconstructions and MIPs were obtained to evaluate the vascular anatomy. Carotid stenosis measurements (when applicable) are obtained utilizing NASCET criteria, using the distal internal carotid diameter as the denominator. RADIATION DOSE REDUCTION: This exam was performed according to the departmental dose-optimization program which includes automated exposure control, adjustment of the mA and/or kV according to patient size and/or use of iterative reconstruction technique. CONTRAST:  75mL OMNIPAQUE  IOHEXOL  350 MG/ML SOLN COMPARISON:  03/23/2024, 03/20/2024, 03/08/2024 FINDINGS: CTA NECK FINDINGS Skeleton: No acute abnormality or high grade bony spinal canal stenosis. Other neck: Normal pharynx, larynx and major salivary glands. No cervical lymphadenopathy. Unremarkable thyroid gland. Upper chest: No pneumothorax or pleural effusion. No nodules or masses. Aortic arch: There is no calcific  atherosclerosis of the aortic arch. Conventional 3 vessel aortic branching pattern. RIGHT carotid system: Normal without aneurysm, dissection or stenosis. LEFT carotid system: Normal without aneurysm, dissection or stenosis. Vertebral arteries: Left dominant configuration. There is no dissection, occlusion or flow-limiting stenosis to the skull base (V1-V3 segments). CTA HEAD FINDINGS POSTERIOR CIRCULATION: Right vertebral artery terminates in PICA. Normal left V4 segment. No proximal occlusion of the anterior or inferior cerebellar arteries. Basilar artery is normal. Superior cerebellar arteries are normal. Posterior cerebral arteries are normal. ANTERIOR CIRCULATION: Intracranial internal carotid arteries are normal. Anterior cerebral arteries are normal. Middle cerebral arteries are normal. Venous sinuses: There are multiple dilated cortical veins, particularly at the left middle cranial fossa. Redemonstration of extensive dural venous sinus thrombosis with mixed density clot. There is limited flow in the transverse and sigmoid sinuses. Approximately 50% of the length of the superior sagittal sinus is occluded. 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 Straight sinus, vein of Galen and internal cerebral veins are patent. Anatomic variants: None Review of the MIP images confirms the above findings. IMPRESSION: 1. No emergent large vessel occlusion or high-grade stenosis of the intracranial arteries. 2. Redemonstration of extensive dural venous sinus thrombosis with limited flow in the transverse and sigmoid sinuses. Approximately 50% of the length of the superior sagittal sinus is occluded. 3. Persistent cavernous sinus thrombosis. Electronically Signed   By: Juanetta Nordmann M.D.   On: 03/23/2024 21:32   CT HEAD CODE STROKE WO CONTRAST Result Date:  03/23/2024 CLINICAL DATA:  Code stroke.  Acute neurologic deficit EXAM: CT HEAD WITHOUT CONTRAST TECHNIQUE: Contiguous axial images were obtained from the base  of the skull through the vertex without intravenous contrast. RADIATION DOSE REDUCTION: This exam was performed according to the departmental dose-optimization program which includes automated exposure control, adjustment of the mA and/or kV according to patient size and/or use of iterative reconstruction technique. COMPARISON:  None Available. FINDINGS: Brain: There is no mass, hemorrhage or extra-axial collection. Low lying cerebellar tonsils and crowding of the basal cisterns are unchanged. The brain parenchyma is normal, without evidence of acute or chronic infarction. Vascular: Hyperdensity within the superior sagittal sinus is consistent with known areas of dural venous sinus thrombosis. Skull: The visualized skull base, calvarium and extracranial soft tissues are normal. Sinuses/Orbits: No fluid levels or advanced mucosal thickening of the visualized paranasal sinuses. No mastoid or middle ear effusion. The orbits are normal. ASPECTS [facility] Stroke Program Early CT Score) - Ganglionic level infarction (caudate, lentiform nuclei, internal capsule, insula, M1-M3 cortex): 7 - Supraganglionic infarction (M4-M6 cortex): 3 Total score (0-10 with 10 being normal): 10 IMPRESSION: 1. No acute intracranial hemorrhage. 2. ASPECTS is 10. 3. Hyperdensity within the superior sagittal sinus is consistent with known areas of dural venous sinus thrombosis. 4. Low lying cerebellar tonsils, unchanged. These results were communicated to Dr. Ann Keto at 9:08 pm on 03/23/2024 by text page via the [facility] messaging system. Electronically Signed   By: Juanetta Nordmann M.D.   On: 03/23/2024 21:12    Recent Labs    03/22/24 0621  WBC 5.8  HGB 12.3*  HCT 38.5*  PLT 270   No results for input(s): "NA", "K", "CL", "CO2", "GLUCOSE", "BUN", "CREATININE", "CALCIUM " in the last 72 hours.   Intake/Output Summary (Last 24 hours) at 03/24/2024 0811 Last data filed at 03/23/2024 1843 Gross per 24 hour  Intake 960 ml  Output  250 ml  Net 710 ml         Physical Exam: Vital Signs Blood pressure 107/71, pulse 78, temperature 98 F (36.7 C), temperature source Oral, resp. rate 18, height 5\' 4"  (1.626 m), weight 65.3 kg, SpO2 99%.      General: awake, alert, appropriate, NAD HENT: conjugate gaze; oropharynx moist CV: regular rate; no JVD Pulmonary: CTA B/L; no W/R/R- good air movement GI: soft, NT, ND, (+)BS Psychiatric: appropriate Neurological: hard to assess cognition UE and LE's- B/L 4+/5 throughout- tested Biceps, triceps, grip and FA; as well as HF DF and PF-  MSK- neck tightness  in upper traps, splenius capitus and levators and scalenes- much looser this AM still  Musculoskeletal:        General: Normal range of motion.     Cervical back: Neck supple. No tenderness.     Comments:  Basically 5-5/5 in all 4 extremities- but hard to test all muscles due to deafness and language barrier  Skin:    General: Skin is warm and dry.     Comments:  IV out of neck is gone- wounds healing  Neurological:     Mental Status: He is alert.     Comments: [name] makes eye contact with examiner.  Exam limited by language barrier.  Follows simple demonstrated commands.  His brother who was in the room does help to translate. Mild ataxia Mild R inattention Mild ot moderate searching pattern on B/L finger to nose   Assessment/Plan: 1. Functional deficits which require 3+ hours per day of interdisciplinary therapy  in a comprehensive inpatient rehab setting. Physiatrist is providing close team supervision and 24 hour management of active medical problems listed below. Physiatrist and rehab team continue to assess barriers to discharge/monitor patient progress toward functional and medical goals  Care Tool:  Bathing    Body parts bathed by patient: Chest, Abdomen, Front perineal area, Buttocks, Right upper leg, Left upper leg, Right lower leg, Left lower leg, Face, Right arm, Left arm          Bathing assist Assist Level: Moderate Assistance - Patient 50 - 74%     Upper Body Dressing/Undressing Upper body dressing   What is the patient wearing?: Pull over shirt    Upper body assist Assist Level: Minimal Assistance - Patient > 75%    Lower Body Dressing/Undressing Lower body dressing      What is the patient wearing?: Pants     Lower body assist Assist for lower body dressing: Maximal Assistance - Patient 25 - 49%     Toileting Toileting    Toileting assist Assist for toileting: Maximal Assistance - Patient 25 - 49%     Transfers Chair/bed transfer  Transfers assist     Chair/bed transfer assist level: Moderate Assistance - Patient 50 - 74%     Locomotion Ambulation   Ambulation assist      Assist level: Moderate Assistance - Patient 50 - 74% Assistive device: Hand held assist Max distance: 60   Walk 10 feet activity   Assist     Assist level: Moderate Assistance - Patient - 50 - 74% Assistive device: Hand held assist   Walk 50 feet activity   Assist Walk 50 feet with 2 turns activity did not occur: Safety/medical concerns  Assist level: Moderate Assistance - Patient - 50 - 74% Assistive device: Hand held assist    Walk 150 feet activity   Assist Walk 150 feet activity did not occur: Safety/medical concerns         Walk 10 feet on uneven surface  activity   Assist Walk 10 feet on uneven surfaces activity did not occur: Safety/medical concerns         Wheelchair     Assist Is the patient using a wheelchair?: Yes Type of Wheelchair: Manual    Wheelchair assist level: Minimal Assistance - Patient > 75% Max wheelchair distance: 25    Wheelchair 50 feet with 2 turns activity    Assist        Assist Level: Moderate Assistance - Patient 50 - 74%   Wheelchair 150 feet activity     Assist      Assist Level: Maximal Assistance - Patient 25 - 49%   Blood pressure 107/71, pulse 78, temperature 98 F  (36.7 C), temperature source Oral, resp. rate 18, height 5\' 4"  (1.626 m), weight 65.3 kg, SpO2 99%.  Medical Problem List and Plan: 1. Functional deficits secondary to persistent dural venous sinus thrombosis             -patient may  shower             -ELOS/Goals: min A- 18-21 days  D/c 5/28  Con't CIR PT and OT and SLP 2.  Antithrombotics: -DVT/anticoagulation:  Pharmaceutical: Coumadin  5/9- Last INR 2.3- managed by pharmacy 5/13- INR 2.1             -antiplatelet therapy: N/A 3. Pain Management: Fioricet  as needed for refractory headaches, Tylenol  as needed  5/9- having HA's- they attempted Topamax - might need to  try Elavil- if doesn't improve by tomorrow, will add Elavil 25 mg at bedtime  5/10- started Elavil 25 mg at bedtime- and lidoderm  patches- 2 patches 8pm to 8am- increased Fioricet  to q4 hours prn- cannot have Topamax  due to low Bicarb levels  5/11- HA somewhat better this AM per son by phone- pt continues to point at 10/10- but muscles in neck much looser  5/13- HA 5/10 this AM- might benefit from TrP Injections but hard to assess consent?  5/14- will d/w family and pt tomorrow 4. Mood/Behavior/Sleep: Provide emotional support             -antipsychotic agents: N/A 5. Neuropsych/cognition: This patient is? capable of making decisions on his own behalf. 6. Skin/Wound Care: Routine skin checks 7. Fluids/Electrolytes/Nutrition: Routine in and outs with follow-up chemistries 8.  Cerebral edema.  Hypertonic saline discontinued 9.  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  Status post IR for thrombectomy was discharged on Eliquis - now changed to Coumadin  10.  History of stroke/recent viral meningitis.  Completed course of Doxy 11.  Refractory headaches.  Medrol  Dosepak.  Topamax  discontinued due to low bicarb- might need to start Elavil, since HA's still 6/10  5/9- still having constant HA's  5/10- pain up to 10/10 today- got head CT due to 10/10 HA- looks OK- no changes-  reexamined pt's neck- very tight musculature- will order Robaxin  750 mg q6 hours prn for neck/posterior HA's- as well as lidocaine  patches nightly- added Elavil 25 mg at bedtime for prevention- and asked therapy to do myofascial release for pt.   5/11- muscles looser- HA appears to be less- appears more comfortable  5/12 not using robaxin  frequently, will schedule AM dose. family indicates headaches/neck pain overall improving  5/13- HA down to 5/10 this AM 12.  Hyperlipidemia.  Lipitor  13.  Congenital Deafness.  Patient does use some sign language- but mainly gestures per ASL interpretor 14.  GERD.  Protonix   15. Abd pain/Constipation  5/9- pt clear having constant abd pain- will get CT of abd/pelvis with contrast to assess abd pain   5/10- Abd CT looks good  -LBM 3 days ago- ordered Sorbitol for constipation  5/11- LBM yesterday  5/12 LBM documented yesterday  5/13- LBM 2 days ago- if no BM by tomorrow, will intervene  5/14- LBM yesterday 16. Anemia. Mild. -5/12 HGB 12.3, stable continue to monitor  17. Code stroke  5/14- Was (-)- but being assessed for seizures.     I spent a total of  46  minutes on total care today- >50% coordination of care- due to  D/w PA , charge nurse as well as nursing and brother/pt.  Reviewed all of chart notes and CT/Angio   LOS: 6 days A FACE TO FACE EVALUATION WAS PERFORMED  Rahmir Beever 03/24/2024, 8:11 AM

## 2024-03-24 NOTE — Progress Notes (Signed)
 STROKE TEAM PROGRESS NOTE   INTERIM HISTORY/SUBJECTIVE Wife at the bedside. Interpretor on ipad used. Pt initially sleepy but arousable and able to maintain wakefulness. However, he is lethargic comparing with 2 weeks ago when I saw him and still has mild right sided weakness which was not present 2 weeks ago. Per therapist, pt needed moderate assist to work with PT/OT yesterday and seemed even worse today. EEG overnight no seizure.   OBJECTIVE  CBC    Component Value Date/Time   WBC 4.3 03/24/2024 1125   RBC 4.14 (L) 03/24/2024 1125   HGB 10.9 (L) 03/24/2024 1125   HCT 35.0 (L) 03/24/2024 1125   PLT 232 03/24/2024 1125   MCV 84.5 03/24/2024 1125   MCH 26.3 03/24/2024 1125   MCHC 31.1 03/24/2024 1125   RDW 12.9 03/24/2024 1125   LYMPHSABS 1.8 03/19/2024 0740   MONOABS 0.6 03/19/2024 0740   EOSABS 0.2 03/19/2024 0740   BASOSABS 0.0 03/19/2024 0740    BMET    Component Value Date/Time   NA 142 03/24/2024 1125   K 3.2 (L) 03/24/2024 1125   CL 116 (H) 03/24/2024 1125   CO2 21 (L) 03/24/2024 1125   GLUCOSE 131 (H) 03/24/2024 1125   BUN 5 (L) 03/24/2024 1125   CREATININE 0.81 03/24/2024 1125   CALCIUM  8.0 (L) 03/24/2024 1125   GFRNONAA >60 03/24/2024 1125    IMAGING past 24 hours Overnight EEG with video Result Date: 03/24/2024 Arleene Lack, MD     03/24/2024  8:15 AM Patient Name: Samuel Warren MRN: [id number] Epilepsy Attending: Arleene Lack Referring Physician/Provider: Augustin Leber, MD Duration: 03/23/2024 2231 to 03/24/2024 0813 Patient history: 33 y.o. male with right sided motor fluctuations in the setting of severe venous sinus thrombosis. EEG to evaluate for seizure Level of alertness: Awake, asleep AEDs during EEG study: VPA Technical aspects: This EEG study was done with scalp electrodes positioned according to the 10-20 International system of electrode placement. Electrical activity was reviewed with band pass filter of 1-70Hz , sensitivity of 7 uV/mm,  display speed of 16mm/sec with a 60Hz  notched filter applied as appropriate. EEG data were recorded continuously and digitally stored.  Video monitoring was available and reviewed as appropriate. Description: The posterior dominant rhythm consists of 8 Hz activity of moderate voltage (25-35 uV) seen predominantly in posterior head regions, symmetric and reactive to eye opening and eye closing. Sleep was characterized by vertex waves, sleep spindles (12 to 14 Hz), maximal frontocentral region. EEG initially showed continuous 3 to 6 Hz theta-delta slowing in left hemisphere, maximal left fronto-temporal region which gradually improved and was noted intermittently. Hyperventilation and photic stimulation were not performed.   ABNORMALITY - Continuous slow, left hemisphere, maximal left fronto-temporal region IMPRESSION: This study is suggestive of cortical dysfunction arising from left hemisphere, maximal left fronto-temporal region likely secondary to underlying structural abnormality. No seizures or epileptiform discharges were seen throughout the recording. Priyanka Suzanne Erps   CT ANGIO HEAD NECK W WO CM (CODE STROKE) Result Date: 03/23/2024 CLINICAL DATA:  Acute neurologic deficit. Dural venous sinus thrombosis. EXAM: CT ANGIOGRAPHY HEAD AND NECK WITH AND WITHOUT CONTRAST TECHNIQUE: Multidetector CT imaging of the head and neck was performed using the standard protocol during bolus administration of intravenous contrast. Multiplanar CT image reconstructions and MIPs were obtained to evaluate the vascular anatomy. Carotid stenosis measurements (when applicable) are obtained utilizing NASCET criteria, using the distal internal carotid diameter as the denominator. RADIATION DOSE REDUCTION: This exam was performed according to  the departmental dose-optimization program which includes automated exposure control, adjustment of the mA and/or kV according to patient size and/or use of iterative reconstruction technique.  CONTRAST:  75mL OMNIPAQUE  IOHEXOL  350 MG/ML SOLN COMPARISON:  03/23/2024, 03/20/2024, 03/08/2024 FINDINGS: CTA NECK FINDINGS Skeleton: No acute abnormality or high grade bony spinal canal stenosis. Other neck: Normal pharynx, larynx and major salivary glands. No cervical lymphadenopathy. Unremarkable thyroid gland. Upper chest: No pneumothorax or pleural effusion. No nodules or masses. Aortic arch: There is no calcific atherosclerosis of the aortic arch. Conventional 3 vessel aortic branching pattern. RIGHT carotid system: Normal without aneurysm, dissection or stenosis. LEFT carotid system: Normal without aneurysm, dissection or stenosis. Vertebral arteries: Left dominant configuration. There is no dissection, occlusion or flow-limiting stenosis to the skull base (V1-V3 segments). CTA HEAD FINDINGS POSTERIOR CIRCULATION: Right vertebral artery terminates in PICA. Normal left V4 segment. No proximal occlusion of the anterior or inferior cerebellar arteries. Basilar artery is normal. Superior cerebellar arteries are normal. Posterior cerebral arteries are normal. ANTERIOR CIRCULATION: Intracranial internal carotid arteries are normal. Anterior cerebral arteries are normal. Middle cerebral arteries are normal. Venous sinuses: There are multiple dilated cortical veins, particularly at the left middle cranial fossa. Redemonstration of extensive dural venous sinus thrombosis with mixed density clot. There is limited flow in the transverse and sigmoid sinuses. Approximately 50% of the length of the superior sagittal sinus is occluded. 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 Straight sinus, vein of Galen and internal cerebral veins are patent. Anatomic variants: None Review of the MIP images confirms the above findings. IMPRESSION: 1. No emergent large vessel occlusion or high-grade stenosis of the intracranial arteries. 2. Redemonstration of extensive dural venous sinus thrombosis with limited flow in the  transverse and sigmoid sinuses. Approximately 50% of the length of the superior sagittal sinus is occluded. 3. Persistent cavernous sinus thrombosis. Electronically Signed   By: Juanetta Nordmann M.D.   On: 03/23/2024 21:32   CT HEAD CODE STROKE WO CONTRAST Result Date: 03/23/2024 CLINICAL DATA:  Code stroke.  Acute neurologic deficit EXAM: CT HEAD WITHOUT CONTRAST TECHNIQUE: Contiguous axial images were obtained from the base of the skull through the vertex without intravenous contrast. RADIATION DOSE REDUCTION: This exam was performed according to the departmental dose-optimization program which includes automated exposure control, adjustment of the mA and/or kV according to patient size and/or use of iterative reconstruction technique. COMPARISON:  None Available. FINDINGS: Brain: There is no mass, hemorrhage or extra-axial collection. Low lying cerebellar tonsils and crowding of the basal cisterns are unchanged. The brain parenchyma is normal, without evidence of acute or chronic infarction. Vascular: Hyperdensity within the superior sagittal sinus is consistent with known areas of dural venous sinus thrombosis. Skull: The visualized skull base, calvarium and extracranial soft tissues are normal. Sinuses/Orbits: No fluid levels or advanced mucosal thickening of the visualized paranasal sinuses. No mastoid or middle ear effusion. The orbits are normal. ASPECTS [facility] Stroke Program Early CT Score) - Ganglionic level infarction (caudate, lentiform nuclei, internal capsule, insula, M1-M3 cortex): 7 - Supraganglionic infarction (M4-M6 cortex): 3 Total score (0-10 with 10 being normal): 10 IMPRESSION: 1. No acute intracranial hemorrhage. 2. ASPECTS is 10. 3. Hyperdensity within the superior sagittal sinus is consistent with known areas of dural venous sinus thrombosis. 4. Low lying cerebellar tonsils, unchanged. These results were communicated to Dr. Ann Keto at 9:08 pm on 03/23/2024 by text page via the  [facility] messaging system. Electronically Signed   By: Juanetta Nordmann M.D.   On: 03/23/2024  21:12     Vitals:   03/24/24 0454 03/24/24 1251 03/24/24 1312 03/24/24 1324  BP: 107/71 94/69 98/77  100/74  Pulse: 78 82    Resp: 18 16    Temp: 98 F (36.7 C) 97.8 F (36.6 C)    TempSrc: Oral Oral    SpO2:  99%    Weight:      Height:         PHYSICAL EXAM General:  drowsy and lethargic, well-developed patient in no acute distress CV: Regular rate and rhythm on monitor Respiratory:  Regular, unlabored respirations on room air GI: Abdomen soft and nontender  Neuro - lethargic, initially sleeping, but easily arousable, then became awake, alert, eyes open, baseline nonverbal and deafness. No gaze palsy, tracking bilaterally, blinking to visual threat bilatearlly, PERRL. No facial droop. Tongue midline. LUE and LLE no drift, RUE and RLE slow drift but distally symmetrical. Sensation not cooperative, b/l FTN intact although slightly slow on the right, gait not tested.    ASSESSMENT/PLAN  Mr. Jaxton Hunsaker is a 33 y.o. 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 CT head with no acute process. 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 CTA and CTV obtained. Heparin  gtt initiated. NIH on Admission 25  Mild R sided weakness, etiology unclear, could be from subacute venous infarct, and encephalopathy with HA, anemia and hypotension, DDx including Sz Patient seems more lethargic than before Per report, had gradual mild R sided weakness for at least last several days On Na tab, with stable Na 140s Overnight EEG - no seizure -> d/c CT and CTA no significant change Episode of severe R weakness overnight but improved fast Anemia Hb 13.4--12.2--12.3--10.9 Iron panel pending, B12 and FA pending Hypotension BP 90s to  100s, no significant orthostatic component On IVF s/p IV bolus, also put on midodrine 5mg  tid Repeat MRI Continue PT and OT  Hx of CVST Admitted 02/01/2024 for CVST.  Status post IR for thrombectomy and discharged on Eliquis . EF 45-50%.  Readmitted 03/02/2024 for right-sided weakness.  CT head showed increased left frontal venous infarct.  CTV showed recurrent occlusive and subocclusive thrombi within the SSS posteriorly, extending to the confluence of sinus.  Thrombus extending into the medial aspect of the right transverse sinus.  Clot burden within the right sigmoid sinus has decreased.  Patient was concerning for dehydration at the time, and received fluid resuscitation.  Discharged in good condition Readmitted 03/08/2024 for left gaze, R weakness and neglect. CT Venogram Mild progression of extensive thrombus in the superior sagittal sinus posteriorly. Mild improvement of subocclusive thrombus in the  transverse and sigmoid sinuses bilaterally. Persistent bilateral cavernous sinus thrombosis. MRI  Evolving subacute venous ischemic changes involving the paramedian frontal lobes and likely the left occipital lobe, not significantly changed as compared to previous.  4/29 Head CT generalized brain swelling.  On personal review, seems more cerebral edema than 4/28. Put on 3% saline, although Na never real high, his symptoms essentially resolved the day after. LDL 58 and A1C 5.7. EEG LTM no seizure. Was initially resumed eliquis  but later changed to coumadin  with INR goal 2-3. Pt discharged to CIR  Hx of Stroke and viral meningitis January 27, 2024 right frontal 2-3 punctate infarcts, etiology: Likely due to infectious vasculitis vs. Small vessel disease in the setting of CNS infection  CSF at that time WBC 22,  RBC 1330, protein 91, glucose 80, concerning for viral meningitis Completed course of Doxy No future LP recommended given no sign of CNS infection and low-lying cerebellar tonsils extending up to 9 mm  below the foreman magnum  HA On amitriptyline  D/c fioricet  Increase depakote  to 750mg  Q8h Check depakote  level 26->pending  Hypotension Pt BP low recently Orthostatic vitals no obvious orthostatic hypotension S/p IV bolus On IVF On midodrine low dose  Hyperlipidemia Home meds:  Atorvastatin  80  LDL in 01/2024 279 In 02/2024 LDL 58, goal < 70 Continue statin   Other Active Problems Hypokalemia K 3.1 - 3.5 - 3.2 - supplement deafness  Hospital day # 6  Patient condition gradually worsened within the last several days, more lethargic and mild right side weakness. I discussed with Dr. Raynaldo Call and rehab team. I spent 50 minutes in total face-to-face time with the patient, talking to his wife, discuss with primary team, ordered labs and MRI brain, reviewed all his labs and images, answered wife questions. This patient's care requiresreview of multiple databases, neurological assessment, discussion with family, other specialists and medical decision making of high complexity.  Consuelo Denmark, MD PhD Stroke Neurology 03/24/2024 1:42 PM   To contact Stroke Continuity provider, please refer to [email or web address]. After hours, contact General Neurology

## 2024-03-24 NOTE — Progress Notes (Signed)
 Physical Therapy Session Note  Patient Details  Name: Samuel Warren MRN: [id number] Date of Birth: Mar 20, 1991  Today's Date: 03/24/2024 PT Individual Time:  -      Short Term Goals: Week 1:  PT Short Term Goal 1 (Week 1): Pt will transfer sup to sit w/ supervision consistently. PT Short Term Goal 2 (Week 1): Pt will improve sitting balance w/ supervision x 5' PT Short Term Goal 3 (Week 1): Pt will transfer sit to stand w/ min A consistently. PT Short Term Goal 4 (Week 1): Pt will amb w/ min A x 50'  Skilled Therapeutic Interventions/Progress Updates: Pt presented in bed in NAD. Session focused on sitting balance and attempted functional mobility. Pt's wife present and through interpreter advised will work on sitting balance as has been cleared for therapies due to completion of EEG. Pt completed supine to sit with modA. Despite maximum hand gestures pt required maxA for anterior scooting. Pt noted to initially require maxA for sitting balance then would improve intermittently to modA for sitting balance. PTA had pt hold onto footboard with LUE to initially assist with sitting balance with pt having difficulty with core control notably leaning forward. With modA for sitting balance, pt was able to perform small reaching tasks with RUE however with limited reach. Pt was also able to follow tasks regarding LAQ but limited to x 5 reps, pt was unable to complete seated hip flexion (was able to mirror task with LLE). With gestures attempted to perform R lateral scoots to [facility] with pt unable to sequence. PTA then assisted with pt leaning anteriorly to PTA however required maxA and limited initiation for x 2 lateral scoots. Pt completed sit to supine with heavy modA and max cues. In bed pt required heavy modA to roll to L and minA to roll to R to adjust pad. Pt then completed x 5 reps ea of SLR, AA hip abd/add, manually resisted leg press and DF. Pt noted to have limited endurance as by 4-5th rep significant  increased difficulty completing task for all activities. With B feet support pt attempted bridges however was only able to complete x 2 when pt gestured that he was unable to do anymore. Pt repositioned to comfort and left in bed with call bell within reach and needs met.  MD notified in secure chat after session regarding pt's current functional change due to pt CGA for sitting balance and was ambulatory on Mon 5/12.      Therapy Documentation Precautions:  Precautions Precautions: Fall, Other (comment) Recall of Precautions/Restrictions: Intact Precaution/Restrictions Comments: R inattention Restrictions Weight Bearing Restrictions Per Provider Order: No General:   Vital Signs: Therapy Vitals Temp: 98 F (36.7 C) Temp Source: Oral Pulse Rate: 78 Resp: 18 BP: 107/71 Patient Position (if appropriate): Lying Pain: Pain Assessment Pain Scale: Faces Pain Score: Asleep Faces Pain Scale: No hurt Pain Location: Head Pain Intervention(s): Medication (See eMAR) PAINAD (Pain Assessment in Advanced Dementia) Breathing: normal Negative Vocalization: none Facial Expression: smiling or inexpressive Body Language: relaxed Consolability: no need to console PAINAD Score: 0 Mobility:   Locomotion :    Trunk/Postural Assessment :    Balance:   Exercises:   Other Treatments:      Therapy/Group: Individual Therapy  Encarnacion Bole 03/24/2024, 8:02 AM

## 2024-03-24 NOTE — Patient Care Conference (Addendum)
 Inpatient RehabilitationTeam Conference and Plan of Care Update Date: 03/23/2024   Time:  1146 am   Patient Name: Samuel Warren      Medical Record Number: [id number]  Date of Birth: 1991/01/29 Sex: Male         Room/Bed: 4M13C/4M13C-01 Payor Info: Payor: Advertising copywriter / Plan: [employer] OTHER / Product Type: *No Product type* /    Admit Date/Time:  03/18/2024  6:21 PM  Primary Diagnosis:  Dural venous sinus thrombosis  Hospital Problems: Principal Problem:   Dural venous sinus thrombosis    Expected Discharge Date: Expected Discharge Date: 04/07/24  Team Members Present: Physician leading conference: Dr. [name] Social Worker Present: Adrianna Albee, LCSW Nurse Present: Jerene Monks, RN PT Present: Aundria Leech, PT OT Present: Nila Barth, OT     Current Status/Progress Goal Weekly Team Focus  Bowel/Bladder   Pt is continent of bowel/bladder   Pt will remain continent of bowel/bladder   Will assess qshift and PRN    Swallow/Nutrition/ Hydration               ADL's   Max LB dressing, Min UB dressing, Mod-Max A static sitting balance on EOB, Mod A transfers, limited by pain, did not complete full bathing yet d/t weakness   SBA overall   global strength/endurance, transfers, ADL retraining    Mobility   min-mod a for short distance gait and transfers with RW, limited by retropulsion and impulsivity   mod i bed mobility, supervision tranfers, gait, stairs  participation, gait, balance, NMR    Communication                Safety/Cognition/ Behavioral Observations               Pain   Pt's faces pain scale is 0   Pt will remain pain free   Will assess qshift and PRN    Skin   Pt's skin is intact   Pt's skin will remain intact  Will assess qshift and PRN      Discharge Planning:  Home with multiple family members between can provide 24/7 care. Difficult to communicate with due to language and deaf barrier.    Team  Discussion: Patient was admitted post persistent dural venous sinus thrombosis.patient has congenital deafness.  Patient has refractory headaches:medication adjusted by MD. Patient limited by abdominal pain, language barrier, impulsitivity and poor safety awareness.  Patient on target to meet rehab goals: yes,  Patient requires minimal assistance with upper body dressing. Patient requires max assist with lower body dressing. Patient requires mod assist with transfers. Patient is able to ambulate short distances min-mod assist using a rolling walker. Overall goals at discharge are set for supervision-mod I assistance.   *See Care Plan and progress notes for long and short-term goals.   Revisions to Treatment Plan:  Interpreter KUB Abdominal CT Scan Communication Board  Teaching Needs: Safety, medications, transfers, toileting, etc   Current Barriers to Discharge: Decreased caregiver support  Possible Resolutions to Barriers: Family Education      Medical Summary Current Status: usually using communication board- is deaf- usually continent-  Barriers to Discharge: Self-care education;Behavior/Mood;Other (comments);Uncontrolled Pain;Medical stability  Barriers to Discharge Comments: deaf- speaks [place]- Severe HA's-  constipation- min-max A with ADLs- impulsive; poor safety awareness- very weak -generalized- goals supervision a lot of retropulsion- Possible Resolutions to [employer] Focus: goal to add Elavil for HA's- cannot do topamax -  d/c 5/28   Continued Need for Acute Rehabilitation Level of  Care: The patient requires daily medical management by a physician with specialized training in physical medicine and rehabilitation for the following reasons: Direction of a multidisciplinary physical rehabilitation program to maximize functional independence : Yes Medical management of patient stability for increased activity during participation in an intensive rehabilitation regime.:  Yes Analysis of laboratory values and/or radiology reports with any subsequent need for medication adjustment and/or medical intervention. : Yes   I attest that I was present, lead the team conference, and concur with the assessment and plan of the team.   Brier Reid Gayo 03/23/2024, 1146 am

## 2024-03-25 DIAGNOSIS — I639 Cerebral infarction, unspecified: Secondary | ICD-10-CM

## 2024-03-25 DIAGNOSIS — G9389 Other specified disorders of brain: Secondary | ICD-10-CM

## 2024-03-25 LAB — BASIC METABOLIC PANEL WITH GFR
Anion gap: 8 (ref 5–15)
BUN: 5 mg/dL — ABNORMAL LOW (ref 6–20)
CO2: 20 mmol/L — ABNORMAL LOW (ref 22–32)
Calcium: 8.9 mg/dL (ref 8.9–10.3)
Chloride: 112 mmol/L — ABNORMAL HIGH (ref 98–111)
Creatinine, Ser: 0.86 mg/dL (ref 0.61–1.24)
GFR, Estimated: >60 mL/min (ref >60)
Glucose, Bld: 97 mg/dL (ref 70–99)
Potassium: 4.1 mmol/L (ref 3.5–5.1)
Sodium: 140 mmol/L (ref 135–145)

## 2024-03-25 LAB — PROTIME-INR
INR: 2.5 — ABNORMAL HIGH (ref 0.8–1.2)
Prothrombin Time: 27 s — ABNORMAL HIGH (ref 11.4–15.2)

## 2024-03-25 LAB — CBC
HCT: 36.2 % — ABNORMAL LOW (ref 39.0–52.0)
Hemoglobin: 11.6 g/dL — ABNORMAL LOW (ref 13.0–17.0)
MCH: 26.9 pg (ref 26.0–34.0)
MCHC: 32 g/dL (ref 30.0–36.0)
MCV: 83.8 fL (ref 80.0–100.0)
Platelets: 246 10*3/uL (ref 150–400)
RBC: 4.32 MIL/uL (ref 4.22–5.81)
RDW: 12.9 % (ref 11.5–15.5)
WBC: 4.9 10*3/uL (ref 4.0–10.5)
nRBC: 0 % (ref 0.0–0.2)

## 2024-03-25 LAB — FOLATE: Folate: 9.9 ng/mL (ref >5.9)

## 2024-03-25 LAB — IRON AND TIBC
Iron: 67 ug/dL (ref 45–182)
Saturation Ratios: 28 % (ref 17.9–39.5)
TIBC: 242 ug/dL — ABNORMAL LOW (ref 250–450)
UIBC: 175 ug/dL

## 2024-03-25 LAB — VALPROIC ACID LEVEL: Valproic Acid Lvl: 41 ug/mL — ABNORMAL LOW (ref 50–100)

## 2024-03-25 LAB — VITAMIN B12: Vitamin B-12: 1142 pg/mL — ABNORMAL HIGH (ref 180–914)

## 2024-03-25 LAB — FERRITIN: Ferritin: 150 ng/mL (ref 24–336)

## 2024-03-25 LAB — AMMONIA: Ammonia: <13 umol/L (ref 9–35)

## 2024-03-25 MED ORDER — WARFARIN SODIUM 1 MG PO TABS
1.5000 mg | ORAL_TABLET | Freq: Once | ORAL | Status: AC
  Administered 2024-03-25: 1.5 mg via ORAL
  Filled 2024-03-25: qty 1

## 2024-03-25 MED ORDER — WARFARIN SODIUM 1 MG PO TABS
1.0000 mg | ORAL_TABLET | Freq: Once | ORAL | Status: DC
  Filled 2024-03-25: qty 1

## 2024-03-25 MED ORDER — MIDODRINE HCL 5 MG PO TABS
7.5000 mg | ORAL_TABLET | Freq: Three times a day (TID) | ORAL | Status: DC
  Administered 2024-03-25 – 2024-04-01 (×21): 7.5 mg via ORAL
  Filled 2024-03-25 (×20): qty 2

## 2024-03-25 NOTE — Progress Notes (Signed)
 PROGRESS NOTE   Subjective/Complaints:  Pt brother reports that pt had severe HA's yesterday- only has Tylenol - explained Fioricet  can cause rebound HA's.  But brother said "tylenol  helped".   On IVF's 75cc/hour- but stopped this AM.   BP running 90s/50's.     ROS: Limited by sleepiness- and deaf  Objective:   MR BRAIN WO CONTRAST Result Date: 03/24/2024 CLINICAL DATA:  Dural venous sinus thrombosis EXAM: MRI HEAD WITHOUT CONTRAST TECHNIQUE: Multiplanar, multiecho pulse sequences of the brain and surrounding structures were obtained without intravenous contrast. COMPARISON:  CTA head neck 03/23/2024 Brain MRI 03/08/2024 FINDINGS: Brain: No acute infarct. Susceptibility weighted imaging shows diffuse cortical venous congestion. Unchanged region of gliosis within the superior paramedian left frontal lobe. Cerebellar tonsils extend 7 mm below the foramen magnum. Vascular: Abnormal flow voids of the bilateral sigmoid and transverse sinuses in the superior sagittal sinus. Arterial flow voids are normal. Skull and upper cervical spine: Normal calvarium and skull base. Visualized upper cervical spine and soft tissues are normal. Sinuses/Orbits:No paranasal sinus fluid levels or advanced mucosal thickening. No mastoid or middle ear effusion. Normal orbits. IMPRESSION: 1. No acute infarct. 2. Abnormal flow voids of the bilateral sigmoid and transverse sinuses and the superior sagittal sinus, consistent with known dural venous sinus thrombosis. 3. Unchanged region of gliosis within the superior paramedian left frontal lobe. 4. 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 Electronically Signed   By: Samuel Warren M.D.   On: 03/24/2024 21:11   Overnight EEG with video Result Date: 03/24/2024 Samuel Lack, MD     03/24/2024  8:15 AM Patient Name: Samuel Warren MRN: [id number] Epilepsy Attending:  Arleene Warren Referring Physician/Provider: Augustin Leber, MD Duration: 03/23/2024 2231 to 03/24/2024 0813 Patient history: 33 y.o. male with right sided motor fluctuations in the setting of severe venous sinus thrombosis. EEG to evaluate for seizure Level of alertness: Awake, asleep AEDs during EEG study: VPA Technical aspects: This EEG study was done with scalp electrodes positioned according to the 10-20 International system of electrode placement. Electrical activity was reviewed with band pass filter of 1-70Hz , sensitivity of 7 uV/mm, display speed of 28mm/sec with a 60Hz  notched filter applied as appropriate. EEG data were recorded continuously and digitally stored.  Video monitoring was available and reviewed as appropriate. Description: The posterior dominant rhythm consists of 8 Hz activity of moderate voltage (25-35 uV) seen predominantly in posterior head regions, symmetric and reactive to eye opening and eye closing. Sleep was characterized by vertex waves, sleep spindles (12 to 14 Hz), maximal frontocentral region. EEG initially showed continuous 3 to 6 Hz theta-delta slowing in left hemisphere, maximal left fronto-temporal region which gradually improved and was noted intermittently. Hyperventilation and photic stimulation were not performed.   ABNORMALITY - Continuous slow, left hemisphere, maximal left fronto-temporal region IMPRESSION: This study is suggestive of cortical dysfunction arising from left hemisphere, maximal left fronto-temporal region likely secondary to underlying structural abnormality. No seizures or epileptiform discharges were seen throughout the recording. Samuel Warren   CT ANGIO HEAD NECK W WO CM (CODE STROKE) Result Date: 03/23/2024 CLINICAL DATA:  Acute neurologic deficit.  Dural venous sinus thrombosis. EXAM: CT ANGIOGRAPHY HEAD AND NECK WITH AND WITHOUT CONTRAST TECHNIQUE: Multidetector CT imaging of the head and neck was performed using the standard protocol  during bolus administration of intravenous contrast. Multiplanar CT image reconstructions and MIPs were obtained to evaluate the vascular anatomy. Carotid stenosis measurements (when applicable) are obtained utilizing NASCET criteria, using the distal internal carotid diameter as the denominator. RADIATION DOSE REDUCTION: This exam was performed according to the departmental dose-optimization program which includes automated exposure control, adjustment of the mA and/or kV according to patient size and/or use of iterative reconstruction technique. CONTRAST:  75mL OMNIPAQUE  IOHEXOL  350 MG/ML SOLN COMPARISON:  03/23/2024, 03/20/2024, 03/08/2024 FINDINGS: CTA NECK FINDINGS Skeleton: No acute abnormality or high grade bony spinal canal stenosis. Other neck: Normal pharynx, larynx and major salivary glands. No cervical lymphadenopathy. Unremarkable thyroid gland. Upper chest: No pneumothorax or pleural effusion. No nodules or masses. Aortic arch: There is no calcific atherosclerosis of the aortic arch. Conventional 3 vessel aortic branching pattern. RIGHT carotid system: Normal without aneurysm, dissection or stenosis. LEFT carotid system: Normal without aneurysm, dissection or stenosis. Vertebral arteries: Left dominant configuration. There is no dissection, occlusion or flow-limiting stenosis to the skull base (V1-V3 segments). CTA HEAD FINDINGS POSTERIOR CIRCULATION: Right vertebral artery terminates in PICA. Normal left V4 segment. No proximal occlusion of the anterior or inferior cerebellar arteries. Basilar artery is normal. Superior cerebellar arteries are normal. Posterior cerebral arteries are normal. ANTERIOR CIRCULATION: Intracranial internal carotid arteries are normal. Anterior cerebral arteries are normal. Middle cerebral arteries are normal. Venous sinuses: There are multiple dilated cortical veins, particularly at the left middle cranial fossa. Redemonstration of extensive dural venous sinus thrombosis  with mixed density clot. There is limited flow in the transverse and sigmoid sinuses. Approximately 50% of the length of the superior sagittal sinus is occluded. 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 Straight sinus, vein of Galen and internal cerebral veins are patent. Anatomic variants: None Review of the MIP images confirms the above findings. IMPRESSION: 1. No emergent large vessel occlusion or high-grade stenosis of the intracranial arteries. 2. Redemonstration of extensive dural venous sinus thrombosis with limited flow in the transverse and sigmoid sinuses. Approximately 50% of the length of the superior sagittal sinus is occluded. 3. Persistent cavernous sinus thrombosis. Electronically Signed   By: Samuel Warren M.D.   On: 03/23/2024 21:32   CT HEAD CODE STROKE WO CONTRAST Result Date: 03/23/2024 CLINICAL DATA:  Code stroke.  Acute neurologic deficit EXAM: CT HEAD WITHOUT CONTRAST TECHNIQUE: Contiguous axial images were obtained from the base of the skull through the vertex without intravenous contrast. RADIATION DOSE REDUCTION: This exam was performed according to the departmental dose-optimization program which includes automated exposure control, adjustment of the mA and/or kV according to patient size and/or use of iterative reconstruction technique. COMPARISON:  None Available. FINDINGS: Brain: There is no mass, hemorrhage or extra-axial collection. Low lying cerebellar tonsils and crowding of the basal cisterns are unchanged. The brain parenchyma is normal, without evidence of acute or chronic infarction. Vascular: Hyperdensity within the superior sagittal sinus is consistent with known areas of dural venous sinus thrombosis. Skull: The visualized skull base, calvarium and extracranial soft tissues are normal. Sinuses/Orbits: No fluid levels or advanced mucosal thickening of the visualized paranasal sinuses. No mastoid or middle ear effusion. The orbits are normal. ASPECTS [facility] Stroke  Program Early CT Score) - Ganglionic level infarction (caudate, lentiform nuclei, internal capsule, insula, M1-M3 cortex): 7 - Supraganglionic infarction (M4-M6 cortex):  3 Total score (0-10 with 10 being normal): 10 IMPRESSION: 1. No acute intracranial hemorrhage. 2. ASPECTS is 10. 3. Hyperdensity within the superior sagittal sinus is consistent with known areas of dural venous sinus thrombosis. 4. Low lying cerebellar tonsils, unchanged. These results were communicated to Dr. Ann Keto at 9:08 pm on 03/23/2024 by text page via the [facility] messaging system. Electronically Signed   By: Samuel Warren M.D.   On: 03/23/2024 21:12    Recent Labs    03/24/24 1125 03/25/24 0555  WBC 4.3 4.9  HGB 10.9* 11.6*  HCT 35.0* 36.2*  PLT 232 246   Recent Labs    03/24/24 1125 03/25/24 0555  NA 142 140  K 3.2* 4.1  CL 116* 112*  CO2 21* 20*  GLUCOSE 131* 97  BUN 5* 5*  CREATININE 0.81 0.86  CALCIUM  8.0* 8.9     Intake/Output Summary (Last 24 hours) at 03/25/2024 4098 Last data filed at 03/25/2024 0835 Gross per 24 hour  Intake 960 ml  Output 1100 ml  Net -140 ml         Physical Exam: Vital Signs Blood pressure 95/73, pulse (!) 55, temperature 98 F (36.7 C), resp. rate 18, height 5\' 4"  (1.626 m), weight 65.3 kg, SpO2 97%.       General: asleep- signalled that wants to sleep; brother at bedside; NAD HENT: conjugate gaze; oropharynx moist CV: regular rhythm- and bradycardic rate; no JVD Pulmonary: CTA B/L; no W/R/R- good air movement GI: soft, NT, ND, (+)BS- normoactive Psychiatric: asleep Neurological: asleep- woke briefly  UE and LE's- B/L 4+/5 throughout on L and 4/5 on R- - tested Biceps, triceps, grip and FA; as well as HF DF and PF-5/14- pt asleep so oculdn't recheck today  MSK- neck tightness  in upper traps, splenius capitus and levators and scalenes- much looser this AM still  Musculoskeletal:        General: Normal range of motion.     Cervical back: Neck  supple. No tenderness.     Comments:  Basically 5-5/5 in all 4 extremities- but hard to test all muscles due to deafness and language barrier  Skin:    General: Skin is warm and dry.     Comments:  IV out of neck is gone- wounds healing  Neurological:     Mental Status: He is alert.     Comments: [name] makes eye contact with examiner.  Exam limited by language barrier.  Follows simple demonstrated commands.  His brother who was in the room does help to translate. Mild ataxia Mild R inattention Mild ot moderate searching pattern on B/L finger to nose   Assessment/Plan: 1. Functional deficits which require 3+ hours per day of interdisciplinary therapy in a comprehensive inpatient rehab setting. Physiatrist is providing close team supervision and 24 hour management of active medical problems listed below. Physiatrist and rehab team continue to assess barriers to discharge/monitor patient progress toward functional and medical goals  Care Tool:  Bathing    Body parts bathed by patient: Chest, Abdomen, Front perineal area, Buttocks, Right upper leg, Left upper leg, Right lower leg, Left lower leg, Face, Right arm, Left arm         Bathing assist Assist Level: Moderate Assistance - Patient 50 - 74%     Upper Body Dressing/Undressing Upper body dressing   What is the patient wearing?: Pull over shirt    Upper body assist Assist Level: Moderate Assistance - Patient 50 - 74%  Lower Body Dressing/Undressing Lower body dressing      What is the patient wearing?: Pants     Lower body assist Assist for lower body dressing: Maximal Assistance - Patient 25 - 49%     Toileting Toileting    Toileting assist Assist for toileting: Maximal Assistance - Patient 25 - 49%     Transfers Chair/bed transfer  Transfers assist     Chair/bed transfer assist level: Moderate Assistance - Patient 50 - 74%     Locomotion Ambulation   Ambulation assist      Assist  level: Moderate Assistance - Patient 50 - 74% Assistive device: Hand held assist Max distance: 60   Walk 10 feet activity   Assist     Assist level: Moderate Assistance - Patient - 50 - 74% Assistive device: Hand held assist   Walk 50 feet activity   Assist Walk 50 feet with 2 turns activity did not occur: Safety/medical concerns  Assist level: Moderate Assistance - Patient - 50 - 74% Assistive device: Hand held assist    Walk 150 feet activity   Assist Walk 150 feet activity did not occur: Safety/medical concerns         Walk 10 feet on uneven surface  activity   Assist Walk 10 feet on uneven surfaces activity did not occur: Safety/medical concerns         Wheelchair     Assist Is the patient using a wheelchair?: Yes Type of Wheelchair: Manual    Wheelchair assist level: Minimal Assistance - Patient > 75% Max wheelchair distance: 25    Wheelchair 50 feet with 2 turns activity    Assist        Assist Level: Moderate Assistance - Patient 50 - 74%   Wheelchair 150 feet activity     Assist      Assist Level: Maximal Assistance - Patient 25 - 49%   Blood pressure 95/73, pulse (!) 55, temperature 98 F (36.7 C), resp. rate 18, height 5\' 4"  (1.626 m), weight 65.3 kg, SpO2 97%.  Medical Problem List and Plan: 1. Functional deficits secondary to persistent dural venous sinus thrombosis             -patient may  shower             -ELOS/Goals: min A- 18-21 days  D/c 5/28  Pt not making a lot of gains and has gone backwards in function  Con't CIR PT, OT and SLP 2.  Antithrombotics: -DVT/anticoagulation:  Pharmaceutical: Coumadin  5/9- Last INR 2.3- managed by pharmacy 5/13- INR 2.1 5/15- INR 2.5             -antiplatelet therapy: N/A 3. Pain Management: Fioricet  as needed for refractory headaches, Tylenol  as needed  5/9- having HA's- they attempted Topamax - might need to try Elavil- if doesn't improve by tomorrow, will add Elavil 25 mg  at bedtime  5/10- started Elavil 25 mg at bedtime- and lidoderm  patches- 2 patches 8pm to 8am- increased Fioricet  to q4 hours prn- cannot have Topamax  due to low Bicarb levels  5/11- HA somewhat better this AM per son by phone- pt continues to point at 10/10- but muscles in neck much looser  5/13- HA 5/10 this AM- might benefit from TrP Injections but hard to assess consent?  5/14- will d/w family and pt tomorrow  5/15- pt's Brother- told me had severe HA yesterday- Fioricet  taken off by Neurology but they added/increased Depakote  for HA control and possible seizures coverage-  4. Mood/Behavior/Sleep: Provide emotional support             -antipsychotic agents: N/A 5. Neuropsych/cognition: This patient is? capable of making decisions on his own behalf. 6. Skin/Wound Care: Routine skin checks 7. Fluids/Electrolytes/Nutrition: Routine in and outs with follow-up chemistries 8.  Cerebral edema.  Hypertonic saline discontinued 9.  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  Status post IR for thrombectomy was discharged on Eliquis - now changed to Coumadin  10.  History of stroke/recent viral meningitis.  Completed course of Doxy 11.  Refractory headaches.  Medrol  Dosepak.  Topamax  discontinued due to low bicarb- might need to start Elavil, since HA's still 6/10  5/9- still having constant HA's  5/10- pain up to 10/10 today- got head CT due to 10/10 HA- looks OK- no changes- reexamined pt's neck- very tight musculature- will order Robaxin  750 mg q6 hours prn for neck/posterior HA's- as well as lidocaine  patches nightly- added Elavil 25 mg at bedtime for prevention- and asked therapy to do myofascial release for pt.   5/11- muscles looser- HA appears to be less- appears more comfortable  5/12 not using robaxin  frequently, will schedule AM dose. family indicates headaches/neck pain overall improving  5/13- HA down to 5/10 this AM 12.  Hyperlipidemia.  Lipitor  13.  Congenital Deafness.  Patient does  use some sign language- but mainly gestures per ASL interpretor 14.  GERD.  Protonix   15. Abd pain/Constipation  5/9- pt clear having constant abd pain- will get CT of abd/pelvis with contrast to assess abd pain   5/10- Abd CT looks good  -LBM 3 days ago- ordered Sorbitol for constipation  5/11- LBM yesterday  5/12 LBM documented yesterday  5/13- LBM 2 days ago- if no BM by tomorrow, will intervene  5/15- LBM yesterday-so abd pain is better 16. Anemia. Mild. -5/12 HGB 12.3, stable continue to monitor  17. Code stroke  5/14- Was (-)- but being assessed for seizures.  5/15- still has mild R sided weakness-  EEG showed cortical dysfunction on L side- but no seizures seen- but MRI looked stable- will wait for Neuro note today- had another episode yesterday it appears based on nursing notes, that was weaker standing/sitting than laying down-it appears that this occurs almost every time up with therapy- much weaker than when laying down.   18. Orthostatic hypotension  5/15- On Midodrine 5 mg TID and on IVF's- 90s systolic- laying down on chart- he's not dry based on BUN/Cr ratio- Off IVF's but if BP stays low, will restart IVF's.  - will increase Midodrine to 7.5 mg TID  I spent a total of 51   minutes on total care today- >50% coordination of care- due to  Reviewed MRI results-shows stable dural thrombosis as well as Chiari Malformation- which was on prior MRI's.  as well as Dr Mollie Anger note- EEG (-) for Seizures- Changed BP meds-   LOS: 7 days A FACE TO FACE EVALUATION WAS PERFORMED  Lakie Mclouth 03/25/2024, 9:23 AM

## 2024-03-25 NOTE — Progress Notes (Signed)
 Physical Therapy Session Note  Patient Details  Name: Samuel Warren MRN: [id number] Date of Birth: 09/04/1991  Today's Date: 03/25/2024 PT Individual Time: 0915-1000 and 1300-1410 PT Individual Time Calculation (min): 45 min and 70 min  Short Term Goals: Week 1:  PT Short Term Goal 1 (Week 1): Pt will transfer sup to sit w/ supervision consistently. PT Short Term Goal 2 (Week 1): Pt will improve sitting balance w/ supervision x 5' PT Short Term Goal 3 (Week 1): Pt will transfer sit to stand w/ min A consistently. PT Short Term Goal 4 (Week 1): Pt will amb w/ min A x 50'  Skilled Therapeutic Interventions/Progress Updates: Tx1: Pt presented in bed with brother present completing breakfast. PTA allowed time to allow pt to complete breakfast, upon this therapist return brother preparing to leave and pt agreeable to therapy. Pt appears more alert this session as compared to yesterday. Pt able to complete supine to sit with modA with attempting to use BUE at bed rail but unable to complete push to full sit. Pt continues to demosntrate decreased trunk control requiring modA initially fading to minA with brief bouts of CGA however returned to modA with fatigue most notable with heavy lean to R. Pt returned to supine with modA and PTA obtained Stedy. Returned to sitting in same manner as prior and set up in [place]. Pt required heavy modA to stand but was able to grasp the crossbar with both rails. While standing on perch pt was able to complete x 5 stands with min/modA with fatigue increased R lateral lean. Pt then began gesturing at head 2/2 pain therefore returned to supine. BP checked upon positioning in supine 108/81 (90) HR 86. Pt was able to assist pt with use of LLE and arm pull self towards HOB. Pt began to motion at mouth, therapist pointed at water and food with pt shaking head no. PTA then showed toothbrish with pt nodding head yes. PTA set up [facility] and tray to allow pt to brush teeth with pt completing  task using BUE with supervision and increased time. Pt was also able to wipe face with washcloth. Once completed pt able to perform SLR x 5, hip abd/add x 5, and SAQ x 5 with cues. Pt repositioned to comfort and left in bed at end of session with bed alarm on, call bell within reach and needs met.   Tx2: Pt presented in bed agreeable to therapy, interpreter present throughout session. Pt appears more alert than am session. Pt agreeable to OOB activity. Pt provided with pants and PTA threaded total A with pt able to perform bridge to pull pants over hips. Pt completed supine to sit on L with modA and noted improved sitting balance from am session. Pt instructed to perform lateral scoot towards w/c to R and was able to perform with minA. Pt completed lateral scoot to w/c on R with minA. Pt doffed gown with minA and donned scrub top with CGA and increased time. Pt transported to main gym and in parallel bars performed Sit to stand x 2 with minA. Pt then attempted ambulation in parallel bars. Pt able to ambulate ~39ft with min/modA and max multimodal cues for improving erect posture as pt tended to forward flex. Pt also required extended rest break due to fatigue but was able to ambulate an additional 76ft forward/backward with modA. Pt noted to step with narrow BOS and increased R lean. BP checked after ambulation 96/72 (80) HR 91. Pt did not  indicate any dizziness/lightheadedness. Due to soft BP pt transported back to room. Pt completed stand pivot transfer with maxA to bed and required modA for sit to supine. BP checked once in supine 102/74 (83) HR 67. Pt repositioned to comfort and left in bed at end of session with bed alarm on, call bell within reach and needs met.      Therapy Documentation Precautions:  Precautions Precautions: Fall, Other (comment) Recall of Precautions/Restrictions: Intact Precaution/Restrictions Comments: R inattention Restrictions Weight Bearing Restrictions Per Provider Order:  No General: PT Amount of Missed Time (min): 15 Minutes PT Missed Treatment Reason: Other (Comment) (completing breakfast) Vital Signs: Therapy Vitals Pulse Rate: 70 BP: 99/74 Patient Position (if appropriate): Lying    Therapy/Group: Individual Therapy  Ryenn Howeth 03/25/2024, 12:55 PM

## 2024-03-25 NOTE — Progress Notes (Addendum)
 STROKE TEAM PROGRESS NOTE   INTERIM HISTORY/SUBJECTIVE Dr. Lovorn and therapist as well as sign language interpreter are at the bedside. Per therapist, pt seems weaker on the right for the last several days, more decline from rehab standpoint. Pt still complaining of HA. 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   OBJECTIVE  CBC    Component Value Date/Time   WBC 4.9 03/25/2024 0555   RBC 4.32 03/25/2024 0555   HGB 11.6 (L) 03/25/2024 0555   HCT 36.2 (L) 03/25/2024 0555   PLT 246 03/25/2024 0555   MCV 83.8 03/25/2024 0555   MCH 26.9 03/25/2024 0555   MCHC 32.0 03/25/2024 0555   RDW 12.9 03/25/2024 0555   LYMPHSABS 1.8 03/19/2024 0740   MONOABS 0.6 03/19/2024 0740   EOSABS 0.2 03/19/2024 0740   BASOSABS 0.0 03/19/2024 0740    BMET    Component Value Date/Time   NA 140 03/25/2024 0555   K 4.1 03/25/2024 0555   CL 112 (H) 03/25/2024 0555   CO2 20 (L) 03/25/2024 0555   GLUCOSE 97 03/25/2024 0555   BUN 5 (L) 03/25/2024 0555   CREATININE 0.86 03/25/2024 0555   CALCIUM  8.9 03/25/2024 0555   GFRNONAA >60 03/25/2024 0555    IMAGING past 24 hours MR BRAIN WO CONTRAST Result Date: 03/24/2024 CLINICAL DATA:  Dural venous sinus thrombosis EXAM: MRI HEAD WITHOUT CONTRAST TECHNIQUE: Multiplanar, multiecho pulse sequences of the brain and surrounding structures were obtained without intravenous contrast. COMPARISON:  CTA head neck 03/23/2024 Brain MRI 03/08/2024 FINDINGS: Brain: No acute infarct. Susceptibility weighted imaging shows diffuse cortical venous congestion. Unchanged region of gliosis within the superior paramedian left frontal lobe. Cerebellar tonsils extend 7 mm below the foramen magnum. Vascular: Abnormal flow voids of the bilateral sigmoid and transverse sinuses in the superior sagittal sinus. Arterial flow voids are normal. Skull and upper cervical spine: Normal calvarium and skull base. Visualized upper cervical spine and soft  tissues are normal. Sinuses/Orbits:No paranasal sinus fluid levels or advanced mucosal thickening. No mastoid or middle ear effusion. Normal orbits. IMPRESSION: 1. No acute infarct. 2. Abnormal flow voids of the bilateral sigmoid and transverse sinuses and the superior sagittal sinus, consistent with known dural venous sinus thrombosis. 3. Unchanged region of gliosis within the superior paramedian left frontal lobe. 4. 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 Electronically Signed   By: Juanetta Nordmann M.D.   On: 03/24/2024 21:11     Vitals:   03/24/24 1312 03/24/24 1324 03/24/24 1802 03/25/24 0400  BP: 98/77 100/74 111/83 95/73  Pulse:   70 (!) 55  Resp:   16 18  Temp:   98 F (36.7 C) 98 F (36.7 C)  TempSrc:   Oral   SpO2:   99% 97%  Weight:      Height:         PHYSICAL EXAM General:  lethargic, well-developed patient in no acute distress CV: Regular rate and rhythm on monitor Respiratory:  Regular, unlabored respirations on room air GI: Abdomen soft and nontender  Neuro - lethargic, initially sleeping, but easily arousable, then became awake, alert, eyes open, baseline nonverbal and deafness. No gaze palsy, tracking bilaterally, blinking to visual threat bilatearlly, PERRL. No facial droop. Tongue midline. LUE and LLE no drift, RUE and RLE slow drift but distally symmetrical. Sensation not cooperative, b/l FTN intact although slightly slow on the right, gait not tested.    ASSESSMENT/PLAN  Samuel Warren is a 33 y.o. 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 CT head with no acute process. 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 CTA and CTV obtained. Heparin  gtt initiated. NIH on Admission 25  Persistent CVST and venous infarct Patient  seems more lethargic than before with mild right sided weakness, therapist also confirmed decline in rehab for the last 2-3 days Seems right sided weakness more so in standing or sitting position  Orthostatic vital neg On Na tab, with stable Na 142->140 Overnight EEG - no seizure -> d/c CT and CTA no significant change. However, reviewed with Dr. Del Favia neuroradiologist seems like there was new venous branch thrombosis at serial 5 images 31 and 32.  Repeat MRI reported left frontal evolving venous infarct with gliosis. However on review with Dr. Del Favia neuroradiologist, seems like there was new venous infarct at left frontal rolandic area.  Episode of severe R weakness overnight but improved fast Anemia Hb 13.4--12.2--12.3--10.9--11.6 Iron panel, B12 and FA unremarkable Hypotension BP 90s to 100s, no significant orthostatic component On IVF s/p IV bolus, also put on midodrine 5->10mg  tid Continue PT and OT Now on coumadin  with INR goal 2-3. He did have INR 2.1-2.2 for the last 4 days, today 2.5. Recommend new INR goal 2.5-3.5. discussed with pharmacy.   Hx of CVST Admitted 02/01/2024 for CVST.  Status post IR for thrombectomy and discharged on Eliquis . EF 45-50%.  Readmitted 03/02/2024 for right-sided weakness.  CT head showed increased left frontal venous infarct.  CTV showed recurrent occlusive and subocclusive thrombi within the SSS posteriorly, extending to the confluence of sinus.  Thrombus extending into the medial aspect of the right transverse sinus.  Clot burden within the right sigmoid sinus has decreased.  Patient was concerning for dehydration at the time, and received fluid resuscitation.  Discharged in good condition Readmitted 03/08/2024 for left gaze, R weakness and neglect. CT Venogram Mild progression of extensive thrombus in the superior sagittal sinus posteriorly. Mild improvement of subocclusive thrombus in the  transverse and sigmoid sinuses bilaterally. Persistent bilateral  cavernous sinus thrombosis. MRI  Evolving subacute venous ischemic changes involving the paramedian frontal lobes and likely the left occipital lobe, not significantly changed as compared to previous.  4/29 Head CT generalized brain swelling.  On personal review, seems more cerebral edema than 4/28. Put on 3% saline, although Na never real high, his symptoms essentially resolved the day after. LDL 58 and A1C 5.7. EEG LTM no seizure. Was initially resumed eliquis  but later changed to coumadin  with INR goal 2-3. Pt discharged to CIR Pt will follow up with stroke clinic Dr. Janett Medin on 05/05/24.   Hx of Stroke and viral meningitis January 27, 2024 right frontal 2-3 punctate infarcts, etiology: Likely due to infectious vasculitis vs. Small vessel disease in the setting of CNS infection  CSF at that time WBC 22, RBC 1330, protein 91, glucose 80, concerning for viral meningitis Completed course of Doxy No future LP recommended given no sign of CNS infection and low-lying cerebellar tonsils extending up to 9 mm below the foreman magnum  HA On amitriptyline 25 D/c fioricet  Increase depakote  to 750mg  Q8h Check depakote  level 26-> 41 Discussed with pharmacist, will continue to monitor VPA level, goal 75-100  Hypotension Pt BP low recently Orthostatic vitals no obvious orthostatic hypotension S/p IV bolus Off IVF, encourage po intake  On midodrine 5->10mg  tid  Hyperlipidemia Home meds:  Atorvastatin  80  LDL in 01/2024 279 In 02/2024 LDL 58, goal < 70 Continue statin   Other Active Problems Hypokalemia K 3.1 - 3.5 - 3.2 - 4.1 deafness  Hospital day # 7  Patient condition worsened within the last 2-3 days, and I have ordered CTA and MRI. I discussed with Dr. Raynaldo Call CIR and Dr. Del Favia radiology. I also coordinated with pharmacy for his coumadin  and INR goal. I spent 50 min total face-to-face time with the patient, reviewing lab results, reviewing images and medication, and discussing the diagnosis,  treatment plan with other providers. This patient's care requiresreview of multiple databases, neurological assessment, discussion with family, other specialists and medical decision making of high complexity.      Consuelo Denmark, MD PhD Stroke Neurology 03/25/2024 11:47 AM   To contact Stroke Continuity provider, please refer to [email or web address]. After hours, contact General Neurology

## 2024-03-25 NOTE — Progress Notes (Signed)
 Patient is holding head and seems like headache is worse. Family is at bedside and wanted an update from doctor. Sent MD message. No new orders at this time.

## 2024-03-25 NOTE — Progress Notes (Signed)
 Occupational Therapy Session Note  Patient Details  Name: Samuel Warren MRN: [id number] Date of Birth: [birth year]  Today's Date: 03/25/2024 OT Individual Time: 1100-1200 OT Individual Time Calculation (min): 60 min    Short Term Goals: Week 2:  OT Short Term Goal 1 (Week 2): Patient will complete LB dressing with mod A OT Short Term Goal 2 (Week 2): Patient will maintain static sitting balance with min A OT Short Term Goal 3 (Week 2): Patient will complete toileting tasks with mod A  Skilled Therapeutic Interventions/Progress Updates:      Therapy Documentation Precautions:  Precautions Precautions: Fall, Other (comment) Recall of Precautions/Restrictions: Intact Precaution/Restrictions Comments: R inattention Restrictions Weight Bearing Restrictions Per Provider Order: No General: Pt supine in bed upon OT arrival, pt nodding, agreeable to OT session. Pt presenting with increased arousal and mood this date with less fatigue, even some smiling.  Pain: no pain reported, pt reporting through interpreter that cervical taping has helped  ADL: OT providing skilled intervention on ADL retraining in order to increase independence with tasks and increase activity tolerance. Pt completed the following tasks at the current level of assist: Bed mobility: Mod A  supine><EOB with multimodal cueing to advance BLE off of bed, cueing to prop onto elbow to manage UE/trunk with increased success, pt also able to scoot to EOB with Min A and sit EOB Min A Transfers:Mod A squat pivot transfer from bed><W/C with multimodal cueing to facilitate  Exercises: OT providing skilled intervention by facilitating the following exercise circuit in order to improve functional activity, strength and endurance to prepare for ADLs such as bathing. Pt completed the following exercises in seated position with no noted LOB/SOB and various repetitions on each exercise and 4# dumbbells: -bicep curls -triceps  extensions -shoulder press -forward punches -shoulder flexion -modified sit ups   Pt supine in bed with bed alarm activated, 2 bed rails up, call light within reach and 4Ps assessed.   Therapy/Group: Individual Therapy  Nila Barth, OTD, OTR/L 03/25/2024, 12:42 PM

## 2024-03-25 NOTE — Plan of Care (Signed)
  Problem: Consults Goal: RH STROKE PATIENT EDUCATION Description: See Patient Education module for education specifics  Outcome: Progressing   Problem: RH BOWEL ELIMINATION Goal: RH STG MANAGE BOWEL WITH ASSISTANCE Description: STG Manage Bowel with  minimal Assistance. Outcome: Progressing   Problem: RH BLADDER ELIMINATION Goal: RH STG MANAGE BLADDER WITH ASSISTANCE Description: STG Manage Bladder With  minimal Assistance Outcome: Progressing   Problem: RH SKIN INTEGRITY Goal: RH STG SKIN FREE OF INFECTION/BREAKDOWN Description: Manage skin free of infection/ breakdown with minimal assistance Outcome: Progressing   Problem: RH SAFETY Goal: RH STG ADHERE TO SAFETY PRECAUTIONS W/ASSISTANCE/DEVICE Description: STG Adhere to Safety Precautions With minimal  Assistance/Device. Outcome: Progressing   Problem: RH COGNITION-NURSING Goal: RH STG USES MEMORY AIDS/STRATEGIES W/ASSIST TO PROBLEM SOLVE Description: STG Uses Memory Aids/Strategies With minimal Assistance to Problem Solve. Outcome: Progressing   Problem: RH PAIN MANAGEMENT Goal: RH STG PAIN MANAGED AT OR BELOW PT'S PAIN GOAL Description: <4 w/ prns Outcome: Progressing   Problem: RH KNOWLEDGE DEFICIT Goal: RH STG INCREASE KNOWLEGDE OF HYPERLIPIDEMIA Description: Manage increase knowledge of hyperlipidemia with minimal assistance from spouse and family members using educational materials provided Outcome: Progressing Goal: RH STG INCREASE KNOWLEDGE OF STROKE PROPHYLAXIS Description: Manage increase knowledge of stroke prohylaxis with minimal assistance from spouse and family members using educational materials provided Outcome: Progressing

## 2024-03-25 NOTE — Progress Notes (Signed)
 Found that PIV was slightly dislodged upon on assessment, with very minor swelling, pt had NS infusing that was stopped. IV removed. Pharmacy notified. Instructed to apply ice, and mark area if possible. Dr Raynaldo Call notified. No further instructions.

## 2024-03-25 NOTE — Progress Notes (Addendum)
 PHARMACY - ANTICOAGULATION CONSULT NOTE  Pharmacy Consult for Warfarin Indication:  central venous sinus thrombosis  No Known Allergies  Patient Measurements: Height: 5\' 4"  (162.6 cm) Weight: 65.3 kg (143 lb 15.4 oz) IBW/kg (Calculated) : 59.2 HEPARIN  DW (KG): 65.3  Vital Signs: Temp: 98 F (36.7 C) (05/15 0400) BP: 95/73 (05/15 0400) Pulse Rate: 55 (05/15 0400)  Labs: Recent Labs    03/23/24 0532 03/24/24 0516 03/24/24 1125 03/25/24 0555  HGB  --   --  10.9* 11.6*  HCT  --   --  35.0* 36.2*  PLT  --   --  232 246  LABPROT 24.2* 24.7*  --  27.0*  INR 2.1* 2.2*  --  2.5*  CREATININE  --   --  0.81 0.86    Estimated Creatinine Clearance: 101.3 mL/min (by C-G formula based on SCr of 0.86 mg/dL).  Assessment: 57 YOM with history of central venous sinus thrombosis on Eliquis  PTA. He returned with new onset of R-sided weakness and cerebral edema.  Patient was initially on IV heparin , then transitioned back to PTA Eliquis .  Pharmacy consulted to transition patient to IV heparin  bridge to Warfarin starting 5/3. IV heparin  stopped on 5/6 with therapeutic INR and currently on Warfarin only.     5/15: Update: INR = 2.5 therapeutic.   Hgb 11.6 stable and pltcwnl /stable. No bleeding reported.  Patient is eating 85-100% of meals.    Potential drug interactions with warfarin are divalproex  (may increase warfarin effect).  Neuro increased divalproex  dose to 750mg  po q12h on 5/14.   On 5/13 PM, Code Stroke called on pt for R arm paralysis, R leg weakness, and facial droop. CT head/CTA/CTP-negative for  hemorrhage/no LVO   Goal of Therapy:  INR 2-3 Monitor platelets by anticoagulation protocol: Yes   Plan:  Warfarin 1.5 mg x 1 today (half of 3mg ;  may need 3mg  daily except 1.5mg  on 1 or 2 days per week) Daily PT/INR Monitor for signs/symptoms of bleeding. Follow CBC every Monday and Thursday    Thank you for allowing pharmacy to be part of this patients care team. Alisa Irish, RPh Clinical Pharmacist [phone] until 3:30 PM then Please check AMION for all [facility] phone numbers After 10:0 PM, call Main Pharmacy [phone]  03/25/2024 12:02 PM

## 2024-03-26 LAB — BASIC METABOLIC PANEL WITH GFR
Anion gap: 9 (ref 5–15)
BUN: 6 mg/dL (ref 6–20)
CO2: 25 mmol/L (ref 22–32)
Calcium: 9.6 mg/dL (ref 8.9–10.3)
Chloride: 105 mmol/L (ref 98–111)
Creatinine, Ser: 0.89 mg/dL (ref 0.61–1.24)
GFR, Estimated: >60 mL/min (ref >60)
Glucose, Bld: 113 mg/dL — ABNORMAL HIGH (ref 70–99)
Potassium: 3.5 mmol/L (ref 3.5–5.1)
Sodium: 139 mmol/L (ref 135–145)

## 2024-03-26 LAB — CBC
HCT: 38.8 % — ABNORMAL LOW (ref 39.0–52.0)
Hemoglobin: 12.4 g/dL — ABNORMAL LOW (ref 13.0–17.0)
MCH: 26.6 pg (ref 26.0–34.0)
MCHC: 32 g/dL (ref 30.0–36.0)
MCV: 83.1 fL (ref 80.0–100.0)
Platelets: 253 10*3/uL (ref 150–400)
RBC: 4.67 MIL/uL (ref 4.22–5.81)
RDW: 13 % (ref 11.5–15.5)
WBC: 5.7 10*3/uL (ref 4.0–10.5)
nRBC: 0 % (ref 0.0–0.2)

## 2024-03-26 LAB — PROTIME-INR
INR: 2.1 — ABNORMAL HIGH (ref 0.8–1.2)
Prothrombin Time: 23.4 s — ABNORMAL HIGH (ref 11.4–15.2)

## 2024-03-26 MED ORDER — WARFARIN SODIUM 5 MG PO TABS
5.0000 mg | ORAL_TABLET | Freq: Once | ORAL | Status: AC
  Administered 2024-03-26: 5 mg via ORAL
  Filled 2024-03-26: qty 1

## 2024-03-26 MED ORDER — AMITRIPTYLINE HCL 25 MG PO TABS
50.0000 mg | ORAL_TABLET | Freq: Every day | ORAL | Status: DC
  Administered 2024-03-26 – 2024-03-29 (×4): 50 mg via ORAL
  Filled 2024-03-26 (×4): qty 2

## 2024-03-26 NOTE — Progress Notes (Signed)
 Unable to elicit participation with OT this am due to severe pain. Patient with interpreter and family. Given flash cards to assist patient with order food. Patient family received education well and will assist patient in their use when his is able. Continue with skilled OT POC.

## 2024-03-26 NOTE — Progress Notes (Signed)
 Physical Therapy Weekly Progress Note  Patient Details  Name: Samuel Warren MRN: [id number] Date of Birth: 1991-05-03  Beginning of progress report period: Mar 19, 2024 End of progress report period: Mar 26, 2024  Today's Date: 03/26/2024   Patient has met 0 of 4 short term goals.  Pt has made limited progression towards goals this week due to pt's fluctuating functional status of unknown etiology. Communication has also been a barrier for pt due to pt being deaf and non-english speaking. Pt continues to c/o severe HA limiting participation. Pt has progressed as well as CGA for sitting balance and ambulation with HHA and minA. Tues 5/ 13 a code Stroke was called however no change in imaging. Post code stroke pt required mod A for sitting balance and modA for ambulation in parallel bars. Today pt has limited participation due to increasing HA although was able to participate in some bed level activities but limited by fatigue/lethargy.    Patient continues to demonstrate the following deficits muscle weakness and muscle paralysis and impaired timing and sequencing, abnormal tone, unbalanced muscle activation, motor apraxia, decreased coordination, and decreased motor planning and therefore will continue to benefit from skilled PT intervention to increase functional independence with mobility.  Patient has had inconsistent participation and performance during therapy sessions 2/2 HA pain.    Continue plan of care.  PT Short Term Goals Week 2:     Skilled Therapeutic Interventions/Progress Updates:      Therapy Documentation Precautions:  Precautions Precautions: Fall, Other (comment) Recall of Precautions/Restrictions: Intact Precaution/Restrictions Comments: R inattention Restrictions Weight Bearing Restrictions Per Provider Order: No General:   Vital Signs: Therapy Vitals Pulse Rate: 77 BP: 116/72 Patient Position (if appropriate): Lying Oxygen Therapy SpO2: 100 % O2 Device:  Room Air Pain:   Vision/Perception     Mobility:   Locomotion :    Trunk/Postural Assessment :    Balance:   Exercises:   Other Treatments:     Therapy/Group: Individual Therapy  Rosita DeChalus 03/26/2024, 3:27 PM

## 2024-03-26 NOTE — Progress Notes (Signed)
 Physical Therapy Session Note  Patient Details  Name: Samuel Warren MRN: [id number] Date of Birth: [birth year]  Today's Date: 03/26/2024 PT Individual Time: 0102-7253 and 6644-0347 PT Individual Time Calculation (min): 15 min and  Short Term Goals: Week 1:  PT Short Term Goal 1 (Week 1): Pt will transfer sup to sit w/ supervision consistently. PT Short Term Goal 2 (Week 1): Pt will improve sitting balance w/ supervision x 5' PT Short Term Goal 3 (Week 1): Pt will transfer sit to stand w/ min A consistently. PT Short Term Goal 4 (Week 1): Pt will amb w/ min A x 50'  Skilled Therapeutic Interventions/Progress Updates: Pt presented in bed with interpreter present. Pt brings hand to head and gestures "no". Of note pt with untouched breakfast tray. Through interpreter pt able to communicate nausea as well. Pt refusing EOB activities. Pt was able to move x 4 extremities with continued tremors noted in RUE. Pt was able to boost self up in bed using bridge technique. Pt missed 45 skilled PT due to refusal 2/2 pain. Will continue efforts as schedule permits.   Tx2: Pt presented in bed with wife present. Pt initially appears agreeable to try to sit EOB. Pt was able to come to sidelying the noted with effort pt would grimace then stop activity. Pt returned to supine and was able to perform x 5 resp SLR (with increased effort with each attempt and barely lifting RLE on last attempted). Pt was able to perform similarly with hip abd/add, and heel slides. PTA obtained .5lb small weighted ball and pt was able to perform "chest press" with BUE however required max multimodal cues to incorporate use of LLE. PTA attempted to have pt perfrom biceps curl with RUE however was only able to complete with [facility] assist. PTA also provided pt with green sponge for grip strength to help assist with bed mobility. Pt was able to squeeze sponge 1-2x before stopping and turning head away. Pt turned to R sidelying with minA with pt  able to move BLE off bed with minA however when pt attempted to initiate pushing trunk to upright position pt would grimace and return to bed. Pt ultimately went back to supine and turned head away from PTA. Pt left in bed at end of session with bed alarm on and in NAD.      Therapy Documentation Precautions:  Precautions Precautions: Fall, Other (comment) Recall of Precautions/Restrictions: Intact Precaution/Restrictions Comments: R inattention Restrictions Weight Bearing Restrictions Per Provider Order: No General: PT Amount of Missed Time (min): 45 Minutes PT Missed Treatment Reason: Patient unwilling to participate;Pain Vital Signs:   Pain: Pain Assessment Pain Scale: 0-10 Pain Score: 6  Pain Location: Head Pain Intervention(s): Medication (See eMAR);Pain med given for lower pain score than stated, per patient request    Therapy/Group: Individual Therapy  Dalya Maselli 03/26/2024, 9:44 AM

## 2024-03-26 NOTE — Progress Notes (Signed)
 STROKE TEAM PROGRESS NOTE   INTERIM HISTORY/SUBJECTIVE Sign language interpreter and wife as well as other family members are at bedside.  They reported that patient this morning again when sitting up complaining of severe headache and neck pain, had to lay down.  Per family, patient has significant headache on sitting up or standing up initially mild at home but then gradually getting worse and currently not able to function in CIR with positional changes.  He did have lumbar puncture in 01/2024, questionable whether mild CSF leak now gradually getting worse.  OBJECTIVE  CBC    Component Value Date/Time   WBC 5.7 03/26/2024 0954   RBC 4.67 03/26/2024 0954   HGB 12.4 (L) 03/26/2024 0954   HCT 38.8 (L) 03/26/2024 0954   PLT 253 03/26/2024 0954   MCV 83.1 03/26/2024 0954   MCH 26.6 03/26/2024 0954   MCHC 32.0 03/26/2024 0954   RDW 13.0 03/26/2024 0954   LYMPHSABS 1.8 03/19/2024 0740   MONOABS 0.6 03/19/2024 0740   EOSABS 0.2 03/19/2024 0740   BASOSABS 0.0 03/19/2024 0740    BMET    Component Value Date/Time   NA 139 03/26/2024 0954   K 3.5 03/26/2024 0954   CL 105 03/26/2024 0954   CO2 25 03/26/2024 0954   GLUCOSE 113 (H) 03/26/2024 0954   BUN 6 03/26/2024 0954   CREATININE 0.89 03/26/2024 0954   CALCIUM  9.6 03/26/2024 0954   GFRNONAA >60 03/26/2024 0954    IMAGING past 24 hours No results found.    Vitals:   03/25/24 1304 03/25/24 1815 03/26/24 0534 03/26/24 1224  BP: 106/74 (!) 130/94 107/74 116/72  Pulse: 92 (!) 58 73 77  Resp: 16 16 18    Temp:  (!) 97.3 F (36.3 C) 98 F (36.7 C)   TempSrc:  Oral    SpO2: 98% 100% 99% 100%  Weight:      Height:         PHYSICAL EXAM General:  lethargic, well-developed patient in no acute distress CV: Regular rate and rhythm on monitor Respiratory:  Regular, unlabored respirations on room air GI: Abdomen soft and nontender  Neuro - lethargic, initially sleeping, but easily arousable, then became awake, alert, eyes  open, baseline nonverbal and deafness. No gaze palsy, tracking bilaterally, blinking to visual threat bilatearlly, PERRL. No facial droop. Tongue midline. LUE and LLE no drift, RUE and RLE slow drift but distally symmetrical. Sensation not cooperative, b/l FTN intact although slightly slow on the right, gait not tested.    ASSESSMENT/PLAN  [name] is a 33 y.o. 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 CT head with no acute process. 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 CTA and CTV obtained. Heparin  gtt initiated. NIH on Admission 25  Persistent CVST and venous infarct Patient seems more lethargic than before with mild right sided weakness, therapist also confirmed decline in rehab for the last 2-3 days Seems right sided weakness more so in standing or sitting position  Orthostatic vital neg On Na tab, with stable Na 142->140 Overnight EEG - no seizure -> d/c CT and CTA no significant change. However, reviewed with Dr. Del Favia neuroradiologist seems like there was new venous branch thrombosis at serial 5 images 31 and 32.  Repeat MRI reported left frontal evolving venous infarct with gliosis. However on review with Dr. Del Favia neuroradiologist, seems like there  was new venous infarct at left frontal rolandic area.  Episode of severe R weakness overnight but improved fast Anemia Hb 13.4--12.2--12.3--10.9--11.6 Iron panel, B12 and FA unremarkable Hypotension BP 90s to 100s, no significant orthostatic component On IVF s/p IV bolus, also put on midodrine 5->10mg  tid Continue PT and OT Now on coumadin  with INR goal 2-3. He did have INR 2.1-2.2 for the last 4 days, today 2.5. Recommend new INR goal 2.5-3.5. discussed with pharmacy.   Hx of CVST Admitted 02/01/2024 for CVST.  Status post IR for thrombectomy  and discharged on Eliquis . EF 45-50%.  Readmitted 03/02/2024 for right-sided weakness.  CT head showed increased left frontal venous infarct.  CTV showed recurrent occlusive and subocclusive thrombi within the SSS posteriorly, extending to the confluence of sinus.  Thrombus extending into the medial aspect of the right transverse sinus.  Clot burden within the right sigmoid sinus has decreased.  Patient was concerning for dehydration at the time, and received fluid resuscitation.  Discharged in good condition Readmitted 03/08/2024 for left gaze, R weakness and neglect. CT Venogram Mild progression of extensive thrombus in the superior sagittal sinus posteriorly. Mild improvement of subocclusive thrombus in the  transverse and sigmoid sinuses bilaterally. Persistent bilateral cavernous sinus thrombosis. MRI  Evolving subacute venous ischemic changes involving the paramedian frontal lobes and likely the left occipital lobe, not significantly changed as compared to previous.  4/29 Head CT generalized brain swelling.  On personal review, seems more cerebral edema than 4/28. Put on 3% saline, although Na never real high, his symptoms essentially resolved the day after. LDL 58 and A1C 5.7. EEG LTM no seizure. Was initially resumed eliquis  but later changed to coumadin  with INR goal 2-3. Pt discharged to CIR Pt will follow up with stroke clinic Dr. Janett Medin on 05/05/24.   Hx of Stroke and viral meningitis January 27, 2024 right frontal 2-3 punctate infarcts, etiology: Likely due to infectious vasculitis vs. Small vessel disease in the setting of CNS infection  CSF at that time WBC 22, RBC 1330, protein 91, glucose 80, concerning for viral meningitis Completed course of Doxy No future LP recommended given no sign of CNS infection and low-lying cerebellar tonsils extending up to 9 mm below the foreman magnum  HA, possibly positional Per family and therapist, patient headache and functioning much worse on sitting up and  standing up, more prominent recently. Had LP 01/2024, not sure if could be have slow CSF leak did not worse to current positional headache. May consider blood patch trial but need heparin  IV and hold off Coumadin . Will have Dr. Janett Medin tomorrow to take look for second opinion. On amitriptyline 25 D/c fioricet  Increase depakote  to 750mg  Q8h Check depakote  level 26-> 41 Discussed with pharmacist, will continue to monitor VPA level, goal 75-100  Hypotension Pt BP low recently Orthostatic vitals no obvious orthostatic hypotension S/p IV bolus Off IVF, encourage po intake On midodrine 5->10mg  tid  Hyperlipidemia Home meds:  Atorvastatin  80  LDL in 01/2024 279 In 02/2024 LDL 58, goal < 70 Continue statin   Other Active Problems Hypokalemia K 3.1 - 3.5 - 3.2 - 4.1 deafness  Hospital day # 8  Patient condition worsened within the last 2-3 days, and I have ordered CTA and MRI. I discussed with Dr. Lovorn CIR. I spent 50 min total face-to-face time with the patient wife and family, had discussion in details regarding his HA, reviewing lab results, reviewing images and medication, and discussing the diagnosis, treatment plan with other  providers. This patient's care requiresreview of multiple databases, neurological assessment, discussion with family, other specialists and medical decision making of high complexity.    Consuelo Denmark, MD PhD Stroke Neurology 03/26/2024 3:29 PM   To contact Stroke Continuity provider, please refer to [email or web address]. After hours, contact General Neurology

## 2024-03-26 NOTE — Progress Notes (Signed)
 PROGRESS NOTE   Subjective/Complaints:  Pt saying needs meds for HA's D/w nursing Also attempted to convince pt yesterday late afternoon to do injections for trp's- however as soon as he saw syringe, he got very upset and was waving hands "NO, no" and shaking head no- was very clear didn't want injections to help HA's- unfortunately, because INR is rising, cannot do now.   ROS: Limited by deafness  Objective:   MR BRAIN WO CONTRAST Result Date: 03/24/2024 CLINICAL DATA:  Dural venous sinus thrombosis EXAM: MRI HEAD WITHOUT CONTRAST TECHNIQUE: Multiplanar, multiecho pulse sequences of the brain and surrounding structures were obtained without intravenous contrast. COMPARISON:  CTA head neck 03/23/2024 Brain MRI 03/08/2024 FINDINGS: Brain: No acute infarct. Susceptibility weighted imaging shows diffuse cortical venous congestion. Unchanged region of gliosis within the superior paramedian left frontal lobe. Cerebellar tonsils extend 7 mm below the foramen magnum. Vascular: Abnormal flow voids of the bilateral sigmoid and transverse sinuses in the superior sagittal sinus. Arterial flow voids are normal. Skull and upper cervical spine: Normal calvarium and skull base. Visualized upper cervical spine and soft tissues are normal. Sinuses/Orbits:No paranasal sinus fluid levels or advanced mucosal thickening. No mastoid or middle ear effusion. Normal orbits. IMPRESSION: 1. No acute infarct. 2. Abnormal flow voids of the bilateral sigmoid and transverse sinuses and the superior sagittal sinus, consistent with known dural venous sinus thrombosis. 3. Unchanged region of gliosis within the superior paramedian left frontal lobe. 4. 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 Electronically Signed   By: Juanetta Nordmann M.D.   On: 03/24/2024 21:11    Recent Labs    03/24/24 1125 03/25/24 0555  WBC 4.3 4.9   HGB 10.9* 11.6*  HCT 35.0* 36.2*  PLT 232 246   Recent Labs    03/24/24 1125 03/25/24 0555  NA 142 140  K 3.2* 4.1  CL 116* 112*  CO2 21* 20*  GLUCOSE 131* 97  BUN 5* 5*  CREATININE 0.81 0.86  CALCIUM  8.0* 8.9     Intake/Output Summary (Last 24 hours) at 03/26/2024 0829 Last data filed at 03/25/2024 1815 Gross per 24 hour  Intake 480 ml  Output 1500 ml  Net -1020 ml         Physical Exam: Vital Signs Blood pressure 107/74, pulse 73, temperature 98 F (36.7 C), resp. rate 18, height 5\' 4"  (1.626 m), weight 65.3 kg, SpO2 99%.       General: awake, alert, appropriate,  this AM; supine in bed; no family at bedside; NAD HENT: conjugate gaze; oropharynx moist CV: regular rate and rhythm; no JVD Pulmonary: CTA B/L; no W/R/R- good air movement GI: soft, NT, ND, (+)BS Psychiatric: appropriate- flat Neurological: alert UE and LE's- B/L 4+/5 throughout on L and 4/5 on R- - tested Biceps, triceps, grip and FA; as well as HF DF and PF-5/14- pt asleep so oculdn't recheck today  MSK- neck tightness  in upper traps, splenius capitus and levators and scalenes- much looser this AM still  Musculoskeletal:        General: Normal range of motion.     Cervical back: Neck supple. No tenderness.  Comments:  Basically 5-5/5 in all 4 extremities- but hard to test all muscles due to deafness and language barrier  Skin:    General: Skin is warm and dry.     Comments:  IV out of neck is gone- wounds healing  Neurological:     Mental Status: He is alert.     Comments: [name] makes eye contact with examiner.  Exam limited by language barrier.  Follows simple demonstrated commands.  His brother who was in the room does help to translate. Mild ataxia Mild R inattention Mild ot moderate searching pattern on B/L finger to nose   Assessment/Plan: 1. Functional deficits which require 3+ hours per day of interdisciplinary therapy in a comprehensive inpatient rehab  setting. Physiatrist is providing close team supervision and 24 hour management of active medical problems listed below. Physiatrist and rehab team continue to assess barriers to discharge/monitor patient progress toward functional and medical goals  Care Tool:  Bathing    Body parts bathed by patient: Chest, Abdomen, Front perineal area, Buttocks, Right upper leg, Left upper leg, Right lower leg, Left lower leg, Face, Right arm, Left arm         Bathing assist Assist Level: Moderate Assistance - Patient 50 - 74%     Upper Body Dressing/Undressing Upper body dressing   What is the patient wearing?: Pull over shirt    Upper body assist Assist Level: Moderate Assistance - Patient 50 - 74%    Lower Body Dressing/Undressing Lower body dressing      What is the patient wearing?: Pants     Lower body assist Assist for lower body dressing: Maximal Assistance - Patient 25 - 49%     Toileting Toileting    Toileting assist Assist for toileting: Maximal Assistance - Patient 25 - 49%     Transfers Chair/bed transfer  Transfers assist     Chair/bed transfer assist level: Moderate Assistance - Patient 50 - 74%     Locomotion Ambulation   Ambulation assist      Assist level: Moderate Assistance - Patient 50 - 74% Assistive device: Hand held assist Max distance: 60   Walk 10 feet activity   Assist     Assist level: Moderate Assistance - Patient - 50 - 74% Assistive device: Hand held assist   Walk 50 feet activity   Assist Walk 50 feet with 2 turns activity did not occur: Safety/medical concerns  Assist level: Moderate Assistance - Patient - 50 - 74% Assistive device: Hand held assist    Walk 150 feet activity   Assist Walk 150 feet activity did not occur: Safety/medical concerns         Walk 10 feet on uneven surface  activity   Assist Walk 10 feet on uneven surfaces activity did not occur: Safety/medical concerns          Wheelchair     Assist Is the patient using a wheelchair?: Yes Type of Wheelchair: Manual    Wheelchair assist level: Minimal Assistance - Patient > 75% Max wheelchair distance: 25    Wheelchair 50 feet with 2 turns activity    Assist        Assist Level: Moderate Assistance - Patient 50 - 74%   Wheelchair 150 feet activity     Assist      Assist Level: Maximal Assistance - Patient 25 - 49%   Blood pressure 107/74, pulse 73, temperature 98 F (36.7 C), resp. rate 18, height 5\' 4"  (1.626  m), weight 65.3 kg, SpO2 99%.  Medical Problem List and Plan: 1. Functional deficits secondary to persistent dural venous sinus thrombosis             -patient may  shower             -ELOS/Goals: min A- 18-21 days  D/c 5/28  Pt not making a lot of gains and has gone backwards in function  Con't CIR PT, OT and SLP- having difficulties with weakness and OH as well as HA's 2.  Antithrombotics: -DVT/anticoagulation:  Pharmaceutical: Coumadin  5/9- Last INR 2.3- managed by pharmacy 5/13- INR 2.1 5/15- INR 2.5 5/16- new goal INR 2.5 to 3.5 since had new stroke per Neuro             -antiplatelet therapy: N/A 3. Pain Management: Fioricet  as needed for refractory headaches, Tylenol  as needed  5/9- having HA's- they attempted Topamax - might need to try Elavil- if doesn't improve by tomorrow, will add Elavil 25 mg at bedtime  5/10- started Elavil 25 mg at bedtime- and lidoderm  patches- 2 patches 8pm to 8am- increased Fioricet  to q4 hours prn- cannot have Topamax  due to low Bicarb levels  5/11- HA somewhat better this AM per son by phone- pt continues to point at 10/10- but muscles in neck much looser  5/13- HA 5/10 this AM- might benefit from TrP Injections but hard to assess consent?  5/14- will d/w family and pt tomorrow  5/15- pt's Brother- told me had severe HA yesterday- Fioricet  taken off by Neurology but they added/increased Depakote  for HA control and possible seizures  coverage-   5/16- increased Elavil to 50 mg at bedtime for HA's- has tylenol  as needed otherwise- cannot do Trp injections since INR is going too high 4. Mood/Behavior/Sleep: Provide emotional support             -antipsychotic agents: N/A 5. Neuropsych/cognition: This patient is? capable of making decisions on his own behalf. 6. Skin/Wound Care: Routine skin checks 7. Fluids/Electrolytes/Nutrition: Routine in and outs with follow-up chemistries 8.  Cerebral edema.  Hypertonic saline discontinued 9.  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  Status post IR for thrombectomy was discharged on Eliquis - now changed to Coumadin  10.  History of stroke/recent viral meningitis.  Completed course of Doxy 11.  Refractory headaches.  Medrol  Dosepak.  Topamax  discontinued due to low bicarb- might need to start Elavil, since HA's still 6/10  5/9- still having constant HA's  5/10- pain up to 10/10 today- got head CT due to 10/10 HA- looks OK- no changes- reexamined pt's neck- very tight musculature- will order Robaxin  750 mg q6 hours prn for neck/posterior HA's- as well as lidocaine  patches nightly- added Elavil 25 mg at bedtime for prevention- and asked therapy to do myofascial release for pt.   5/11- muscles looser- HA appears to be less- appears more comfortable  5/12 not using robaxin  frequently, will schedule AM dose. family indicates headaches/neck pain overall improving  5/13- HA down to 5/10 this AM  5/16- Fioricet  stopped by Neuro- due to risk of rebound HA's- added Elavil and then increased to 50 mg at bedtime today.   -cannot do Trp Injections since his INR is going to be 2.5 to 3.5- and that's too high for me to feel comfortable doing trp injections 12.  Hyperlipidemia.  Lipitor  13.  Congenital Deafness.  Patient does use some sign language- but mainly gestures per ASL interpretor 14.  GERD.  Protonix   15.  Abd pain/Constipation  5/9- pt clear having constant abd pain- will get CT of  abd/pelvis with contrast to assess abd pain   5/10- Abd CT looks good  -LBM 3 days ago- ordered Sorbitol for constipation  5/11- LBM yesterday  5/12 LBM documented yesterday  5/13- LBM 2 days ago- if no BM by tomorrow, will intervene  5/15- LBM yesterday-so abd pain is better  5/16- LBM 2 days ago 16. Anemia. Mild. -5/12 HGB 12.3, stable continue to monitor  17. Code stroke  5/14- Was (-)- but being assessed for seizures.  5/15- still has mild R sided weakness-  EEG showed cortical dysfunction on L side- but no seizures seen- but MRI looked stable- will wait for Neuro note today- had another episode yesterday it appears based on nursing notes, that was weaker standing/sitting than laying down-it appears that this occurs almost every time up with therapy- much weaker than when laying down. 5/16- Neuro spoke with radiology and they said he did actually have a new small stroke that would explain weakness of R side   18. Orthostatic hypotension  5/15- On Midodrine 5 mg TID and on IVF's- 90s systolic- laying down on chart- he's not dry based on BUN/Cr ratio- Off IVF's but if BP stays low, will restart IVF's.  - will increase Midodrine to 7.5 mg TID  5/16- will d/w therapy and see how it's doing for him to see if need to increase midodrine again.   I spent a total of  43  minutes on total care today- >50% coordination of care- due to  D/w pt and nursing about HA'-s and with Neuro   LOS: 8 days A FACE TO FACE EVALUATION WAS PERFORMED  Samuel Warren 03/26/2024, 8:29 AM

## 2024-03-26 NOTE — Progress Notes (Signed)
 PHARMACY - ANTICOAGULATION CONSULT NOTE  Pharmacy Consult for Warfarin Indication:  central venous sinus thrombosis  No Known Allergies  Patient Measurements: Height: 5\' 4"  (162.6 cm) Weight: 65.3 kg (143 lb 15.4 oz) IBW/kg (Calculated) : 59.2 HEPARIN  DW (KG): 65.3  Vital Signs: Temp: 98 F (36.7 C) (05/16 0534) BP: 107/74 (05/16 0534) Pulse Rate: 73 (05/16 0534)  Labs: Recent Labs    03/24/24 0516 03/24/24 1125 03/24/24 1125 03/25/24 0555 03/26/24 0954  HGB  --  10.9*   < > 11.6* 12.4*  HCT  --  35.0*  --  36.2* 38.8*  PLT  --  232  --  246 253  LABPROT 24.7*  --   --  27.0* 23.4*  INR 2.2*  --   --  2.5* 2.1*  CREATININE  --  0.81  --  0.86 0.89   < > = values in this interval not displayed.    Estimated Creatinine Clearance: 97.9 mL/min (by C-G formula based on SCr of 0.89 mg/dL).  Assessment: 55 YOM with history of central venous sinus thrombosis on Eliquis  PTA. He returned with new onset of R-sided weakness and cerebral edema.  Patient was initially on IV heparin , then transitioned back to PTA Eliquis .  Pharmacy consulted to transition patient to IV heparin  bridge to Warfarin starting 5/3. IV heparin  stopped on 5/6 with therapeutic INR and currently on Warfarin only.   New INR goal of 2.5 to 3.5   Goal of Therapy:  INR goal 2.5 to 3.5 Monitor platelets by anticoagulation protocol: Yes   Plan:  Warfarin 5 mg po x 1 today Daily PT/INR Monitor for signs/symptoms of bleeding. Follow CBC every Monday and Thursday    Thank you Lennice Quivers, PharmD  03/26/2024 10:43 AM

## 2024-03-27 DIAGNOSIS — Z8661 Personal history of infections of the central nervous system: Secondary | ICD-10-CM

## 2024-03-27 DIAGNOSIS — R112 Nausea with vomiting, unspecified: Secondary | ICD-10-CM

## 2024-03-27 DIAGNOSIS — I951 Orthostatic hypotension: Secondary | ICD-10-CM

## 2024-03-27 LAB — CBC
HCT: 40 % (ref 39.0–52.0)
Hemoglobin: 12.9 g/dL — ABNORMAL LOW (ref 13.0–17.0)
MCH: 26.4 pg (ref 26.0–34.0)
MCHC: 32.3 g/dL (ref 30.0–36.0)
MCV: 81.8 fL (ref 80.0–100.0)
Platelets: 280 10*3/uL (ref 150–400)
RBC: 4.89 MIL/uL (ref 4.22–5.81)
RDW: 12.9 % (ref 11.5–15.5)
WBC: 6.6 10*3/uL (ref 4.0–10.5)
nRBC: 0 % (ref 0.0–0.2)

## 2024-03-27 LAB — BASIC METABOLIC PANEL WITH GFR
Anion gap: 15 (ref 5–15)
BUN: 7 mg/dL (ref 6–20)
CO2: 23 mmol/L (ref 22–32)
Calcium: 10 mg/dL (ref 8.9–10.3)
Chloride: 105 mmol/L (ref 98–111)
Creatinine, Ser: 0.83 mg/dL (ref 0.61–1.24)
GFR, Estimated: >60 mL/min (ref >60)
Glucose, Bld: 143 mg/dL — ABNORMAL HIGH (ref 70–99)
Potassium: 3.8 mmol/L (ref 3.5–5.1)
Sodium: 143 mmol/L (ref 135–145)

## 2024-03-27 LAB — PROTIME-INR
INR: 1.9 — ABNORMAL HIGH (ref 0.8–1.2)
Prothrombin Time: 22.2 s — ABNORMAL HIGH (ref 11.4–15.2)

## 2024-03-27 MED ORDER — DIVALPROEX SODIUM 250 MG PO DR TAB
250.0000 mg | DELAYED_RELEASE_TABLET | Freq: Two times a day (BID) | ORAL | Status: DC
  Administered 2024-03-27 – 2024-04-07 (×23): 250 mg via ORAL
  Filled 2024-03-27 (×23): qty 1

## 2024-03-27 MED ORDER — SORBITOL 70 % SOLN
30.0000 mL | Freq: Once | Status: AC
  Administered 2024-03-27: 30 mL via ORAL
  Filled 2024-03-27: qty 30

## 2024-03-27 MED ORDER — WARFARIN SODIUM 5 MG PO TABS
5.0000 mg | ORAL_TABLET | Freq: Once | ORAL | Status: AC
  Administered 2024-03-27: 5 mg via ORAL
  Filled 2024-03-27: qty 1

## 2024-03-27 NOTE — Plan of Care (Signed)
  Problem: Consults Goal: RH STROKE PATIENT EDUCATION Description: See Patient Education module for education specifics  Outcome: Progressing   Problem: RH BOWEL ELIMINATION Goal: RH STG MANAGE BOWEL WITH ASSISTANCE Description: STG Manage Bowel with  minimal Assistance. Outcome: Progressing   Problem: RH BLADDER ELIMINATION Goal: RH STG MANAGE BLADDER WITH ASSISTANCE Description: STG Manage Bladder With  minimal Assistance Outcome: Progressing   Problem: RH SKIN INTEGRITY Goal: RH STG SKIN FREE OF INFECTION/BREAKDOWN Description: Manage skin free of infection/ breakdown with minimal assistance Outcome: Progressing   Problem: RH SAFETY Goal: RH STG ADHERE TO SAFETY PRECAUTIONS W/ASSISTANCE/DEVICE Description: STG Adhere to Safety Precautions With minimal  Assistance/Device. Outcome: Progressing   Problem: RH COGNITION-NURSING Goal: RH STG USES MEMORY AIDS/STRATEGIES W/ASSIST TO PROBLEM SOLVE Description: STG Uses Memory Aids/Strategies With minimal Assistance to Problem Solve. Outcome: Progressing   Problem: RH PAIN MANAGEMENT Goal: RH STG PAIN MANAGED AT OR BELOW PT'S PAIN GOAL Description: <4 w/ prns Outcome: Progressing   Problem: RH KNOWLEDGE DEFICIT Goal: RH STG INCREASE KNOWLEGDE OF HYPERLIPIDEMIA Description: Manage increase knowledge of hyperlipidemia with minimal assistance from spouse and family members using educational materials provided Outcome: Progressing Goal: RH STG INCREASE KNOWLEDGE OF STROKE PROPHYLAXIS Description: Manage increase knowledge of stroke prohylaxis with minimal assistance from spouse and family members using educational materials provided Outcome: Progressing

## 2024-03-27 NOTE — Progress Notes (Signed)
 PROGRESS NOTE   Subjective/Complaints: Continue to have Headaches.  Seen by neurology team, depakote  was decreased. CT/MRI completed yesterday. Note yesterday indicates Dr. Janett Medin to evaluate him today.   ROS: Limited by deafness  Objective:   No results found.   Recent Labs    03/26/24 0954 03/27/24 0553  WBC 5.7 6.6  HGB 12.4* 12.9*  HCT 38.8* 40.0  PLT 253 280   Recent Labs    03/26/24 0954 03/27/24 0553  NA 139 143  K 3.5 3.8  CL 105 105  CO2 25 23  GLUCOSE 113* 143*  BUN 6 7  CREATININE 0.89 0.83  CALCIUM  9.6 10.0    No intake or output data in the 24 hours ending 03/27/24 1158        Physical Exam: Vital Signs Blood pressure 127/80, pulse 73, temperature 99.2 F (37.3 C), temperature source Oral, resp. rate 18, height 5\' 4"  (1.626 m), weight 65.3 kg, SpO2 96%.       General: awake, alert, appropriate,  this AM; supine in bed; no family at bedside; NAD HENT: conjugate gaze; oropharynx moist CV: regular rate and rhythm; no JVD Pulmonary: CTA B/L; no W/R/R- good air movement GI: soft, NT, ND, (+)BS Psychiatric: appropriate- flat Neurological: alert UE and LE's- B/L 4+/5 throughout on L and 4/5 on R- - tested Biceps, triceps, grip and FA; as well as HF DF and PF-5/14- pt asleep so oculdn't recheck today  MSK- neck tightness  in upper traps, splenius capitus and levators and scalenes- much looser this AM still  Musculoskeletal:        General: Normal range of motion.     Cervical back: Neck supple. No tenderness.     Comments:  Basically 5-5/5 in all 4 extremities- but hard to test all muscles due to deafness and language barrier  Skin:    General: Skin is warm and dry.     Comments:  IV out of neck is gone- wounds healing  Neurological:     Mental Status: He is alert.     Comments: [name] makes eye contact with examiner.  Exam limited by language barrier.  Follows simple  demonstrated commands.  Family in room to help translate Mild ataxia Mild R inattention Mild ot moderate searching pattern on B/L finger to nose   Neuro exam appears consistent with prior but difficult to test due to communication 5/17  Assessment/Plan: 1. Functional deficits which require 3+ hours per day of interdisciplinary therapy in a comprehensive inpatient rehab setting. Physiatrist is providing close team supervision and 24 hour management of active medical problems listed below. Physiatrist and rehab team continue to assess barriers to discharge/monitor patient progress toward functional and medical goals  Care Tool:  Bathing    Body parts bathed by patient: Chest, Abdomen, Front perineal area, Buttocks, Right upper leg, Left upper leg, Right lower leg, Left lower leg, Face, Right arm, Left arm         Bathing assist Assist Level: Moderate Assistance - Patient 50 - 74%     Upper Body Dressing/Undressing Upper body dressing   What is the patient wearing?: Pull over shirt    Upper  body assist Assist Level: Moderate Assistance - Patient 50 - 74%    Lower Body Dressing/Undressing Lower body dressing      What is the patient wearing?: Pants     Lower body assist Assist for lower body dressing: Maximal Assistance - Patient 25 - 49%     Toileting Toileting    Toileting assist Assist for toileting: Maximal Assistance - Patient 25 - 49%     Transfers Chair/bed transfer  Transfers assist     Chair/bed transfer assist level: Moderate Assistance - Patient 50 - 74%     Locomotion Ambulation   Ambulation assist      Assist level: Moderate Assistance - Patient 50 - 74% Assistive device: Hand held assist Max distance: 60   Walk 10 feet activity   Assist     Assist level: Moderate Assistance - Patient - 50 - 74% Assistive device: Hand held assist   Walk 50 feet activity   Assist Walk 50 feet with 2 turns activity did not occur: Safety/medical  concerns  Assist level: Moderate Assistance - Patient - 50 - 74% Assistive device: Hand held assist    Walk 150 feet activity   Assist Walk 150 feet activity did not occur: Safety/medical concerns         Walk 10 feet on uneven surface  activity   Assist Walk 10 feet on uneven surfaces activity did not occur: Safety/medical concerns         Wheelchair     Assist Is the patient using a wheelchair?: Yes Type of Wheelchair: Manual    Wheelchair assist level: Minimal Assistance - Patient > 75% Max wheelchair distance: 25    Wheelchair 50 feet with 2 turns activity    Assist        Assist Level: Moderate Assistance - Patient 50 - 74%   Wheelchair 150 feet activity     Assist      Assist Level: Maximal Assistance - Patient 25 - 49%   Blood pressure 127/80, pulse 73, temperature 99.2 F (37.3 C), temperature source Oral, resp. rate 18, height 5\' 4"  (1.626 m), weight 65.3 kg, SpO2 96%.  Medical Problem List and Plan: 1. Functional deficits secondary to persistent dural venous sinus thrombosis             -patient may  shower             -ELOS/Goals: min A- 18-21 days  D/c 5/28  Pt not making a lot of gains and has gone backwards in function  Con't CIR PT, OT and SLP- having difficulties with weakness and OH as well as HA's 2.  Antithrombotics: -DVT/anticoagulation:  Pharmaceutical: Coumadin  5/9- Last INR 2.3- managed by pharmacy 5/13- INR 2.1 5/15- INR 2.5 5/16- new goal INR 2.5 to 3.5 since had new stroke per Neuro             -antiplatelet therapy: N/A 3. Pain Management: Fioricet  as needed for refractory headaches, Tylenol  as needed  5/9- having HA's- they attempted Topamax - might need to try Elavil - if doesn't improve by tomorrow, will add Elavil  25 mg at bedtime  5/10- started Elavil  25 mg at bedtime- and lidoderm  patches- 2 patches 8pm to 8am- increased Fioricet  to q4 hours prn- cannot have Topamax  due to low Bicarb levels  5/11- HA  somewhat better this AM per son by phone- pt continues to point at 10/10- but muscles in neck much looser  5/13- HA 5/10 this AM- might benefit from TrP Injections  but hard to assess consent?  5/14- will d/w family and pt tomorrow  5/15- pt's Brother- told me had severe HA yesterday- Fioricet  taken off by Neurology but they added/increased Depakote  for HA control and possible seizures coverage-   5/16- increased Elavil  to 50 mg at bedtime for HA's- has tylenol  as needed otherwise- cannot do Trp injections since INR is going too high 4. Mood/Behavior/Sleep: Provide emotional support             -antipsychotic agents: N/A 5. Neuropsych/cognition: This patient is? capable of making decisions on his own behalf. 6. Skin/Wound Care: Routine skin checks 7. Fluids/Electrolytes/Nutrition: Routine in and outs with follow-up chemistries 8.  Cerebral edema.  Hypertonic saline discontinued 9.  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  Status post IR for thrombectomy was discharged on Eliquis - now changed to Coumadin  10.  History of stroke/recent viral meningitis.  Completed course of Doxy 11.  Refractory headaches.  Medrol  Dosepak.  Topamax  discontinued due to low bicarb- might need to start Elavil , since HA's still 6/10  5/9- still having constant HA's  5/10- pain up to 10/10 today- got head CT due to 10/10 HA- looks OK- no changes- reexamined pt's neck- very tight musculature- will order Robaxin  750 mg q6 hours prn for neck/posterior HA's- as well as lidocaine  patches nightly- added Elavil  25 mg at bedtime for prevention- and asked therapy to do myofascial release for pt.   5/11- muscles looser- HA appears to be less- appears more comfortable  5/12 not using robaxin  frequently, will schedule AM dose. family indicates headaches/neck pain overall improving  5/13- HA down to 5/10 this AM  5/16- Fioricet  stopped by Neuro- due to risk of rebound HA's- added Elavil  and then increased to 50 mg at bedtime  today.   -cannot do Trp Injections since his INR is going to be 2.5 to 3.5- and that's too high for me to feel comfortable doing trp injections  5/17 neurology following, decreased depakote , encouraged hydration  12.  Hyperlipidemia.  Lipitor  13.  Congenital Deafness.  Patient does use some sign language- but mainly gestures per ASL interpretor 14.  GERD.  Protonix   15. Abd pain/Constipation  5/9- pt clear having constant abd pain- will get CT of abd/pelvis with contrast to assess abd pain   5/10- Abd CT looks good  -LBM 3 days ago- ordered Sorbitol  for constipation  5/11- LBM yesterday  5/12 LBM documented yesterday  5/13- LBM 2 days ago- if no BM by tomorrow, will intervene  5/15- LBM yesterday-so abd pain is better  5/16- LBM 2 days ago  5/17 Will add sorbitol  30ml 16. Anemia. Mild. -5/12 HGB 12.3, stable continue to monitor  17. Code stroke  5/14- Was (-)- but being assessed for seizures.  5/15- still has mild R sided weakness-  EEG showed cortical dysfunction on L side- but no seizures seen- but MRI looked stable- will wait for Neuro note today- had another episode yesterday it appears based on nursing notes, that was weaker standing/sitting than laying down-it appears that this occurs almost every time up with therapy- much weaker than when laying down. 5/16- Neuro spoke with radiology and they said he did actually have a new small stroke that would explain weakness of R side   5/17 neuro following, appears Dr. Janett Medin may see him today 13. Orthostatic hypotension  5/15- On Midodrine  5 mg TID and on IVF's- 90s systolic- laying down on chart- he's not dry based on BUN/Cr ratio- Off IVF's  but if BP stays low, will restart IVF's.  - will increase Midodrine  to 7.5 mg TID  5/16- will d/w therapy and see how it's doing for him to see if need to increase midodrine  again.   5/17 monitor BP with therapy       03/27/2024    4:00 AM 03/26/2024    7:37 PM 03/26/2024   12:24 PM  Vitals with  BMI  Systolic 127 111 161  Diastolic 80 72 72  Pulse 73 71 77     LOS: 9 days A FACE TO FACE EVALUATION WAS PERFORMED  Lylia Sand 03/27/2024, 11:58 AM

## 2024-03-27 NOTE — Progress Notes (Signed)
 PHARMACY - ANTICOAGULATION CONSULT NOTE  Pharmacy Consult for Warfarin Indication:  central venous sinus thrombosis  No Known Allergies  Patient Measurements: Height: 5\' 4"  (162.6 cm) Weight: 65.3 kg (143 lb 15.4 oz) IBW/kg (Calculated) : 59.2 HEPARIN  DW (KG): 65.3  Vital Signs: Temp: 99.2 F (37.3 C) (05/17 0400) Temp Source: Oral (05/17 0400) BP: 127/80 (05/17 0400) Pulse Rate: 73 (05/17 0400)  Labs: Recent Labs    03/25/24 0555 03/26/24 0954 03/27/24 0553  HGB 11.6* 12.4* 12.9*  HCT 36.2* 38.8* 40.0  PLT 246 253 280  LABPROT 27.0* 23.4* 22.2*  INR 2.5* 2.1* 1.9*  CREATININE 0.86 0.89 0.83    Estimated Creatinine Clearance: 105 mL/min (by C-G formula based on SCr of 0.83 mg/dL).  Assessment: 61 YOM with history of central venous sinus thrombosis on Eliquis  PTA. He returned with new onset of R-sided weakness and cerebral edema.  Patient was initially on IV heparin , then transitioned back to PTA Eliquis .  Pharmacy consulted to transition patient to IV heparin  bridge to Warfarin starting 5/3. IV heparin  stopped on 5/6 with therapeutic INR and currently on Warfarin only.   New INR goal of 2.5 to 3.5   Goal of Therapy:  INR goal 2.5 to 3.5 Monitor platelets by anticoagulation protocol: Yes   Plan:  Repeat Warfarin 5 mg po x 1 today Daily PT/INR Monitor for signs/symptoms of bleeding.  Thank you Lennice Quivers, PharmD  03/27/2024 9:15 AM

## 2024-03-27 NOTE — Progress Notes (Signed)
 Sponsor for the pt would like for the staff to know that the pt has decided to have the injection procedure done as soon as possible.  She says the brother is the one to talk to to make decisions for the pt.  I have sent Dr Raynaldo Call a secure chat to make her aware of the decision made by the brother and the family.  Sponsor is also asking that the staff check in on the pt more often at night, I will relay this request to the night nurse and make her aware of other concerns that the sponsor and family has.

## 2024-03-27 NOTE — Progress Notes (Addendum)
 STROKE TEAM PROGRESS NOTE   INTERIM HISTORY/SUBJECTIVE Family at the bedside.  Resting comfortably in the bed.  No complaints of headache or neck pain at this time Per family patient has been vomiting.  Will decrease Depakote  to 250 mg twice daily.  Encourage hydration  CBC    Component Value Date/Time   WBC 6.6 03/27/2024 0553   RBC 4.89 03/27/2024 0553   HGB 12.9 (L) 03/27/2024 0553   HCT 40.0 03/27/2024 0553   PLT 280 03/27/2024 0553   MCV 81.8 03/27/2024 0553   MCH 26.4 03/27/2024 0553   MCHC 32.3 03/27/2024 0553   RDW 12.9 03/27/2024 0553   LYMPHSABS 1.8 03/19/2024 0740   MONOABS 0.6 03/19/2024 0740   EOSABS 0.2 03/19/2024 0740   BASOSABS 0.0 03/19/2024 0740    BMET    Component Value Date/Time   NA 143 03/27/2024 0553   K 3.8 03/27/2024 0553   CL 105 03/27/2024 0553   CO2 23 03/27/2024 0553   GLUCOSE 143 (H) 03/27/2024 0553   BUN 7 03/27/2024 0553   CREATININE 0.83 03/27/2024 0553   CALCIUM  10.0 03/27/2024 0553   GFRNONAA >60 03/27/2024 0553    IMAGING past 24 hours No results found.    Vitals:   03/26/24 0534 03/26/24 1224 03/26/24 1937 03/27/24 0400  BP: 107/74 116/72 111/72 127/80  Pulse: 73 77 71 73  Resp: 18  17 18   Temp: 98 F (36.7 C)  98.3 F (36.8 C) 99.2 F (37.3 C)  TempSrc:    Oral  SpO2: 99% 100% 97% 96%  Weight:      Height:         PHYSICAL EXAM General: Awake alert, well-developed patient in no acute distress CV: Regular rate and rhythm on monitor Respiratory:  Regular, unlabored respirations on room air GI: Abdomen soft and nontender  Neuro -awake and alert,  eyes open, baseline nonverbal and deafness. No gaze palsy, tracking bilaterally, blinking to visual threat bilatearlly, PERRL. No facial droop. Tongue midline. LUE and LLE no drift, RUE and RLE drift  Sensation not cooperative, b/l FTN intact although slightly slow on the right, gait not tested.    ASSESSMENT/PLAN  [name] is a 33 y.o. male with history of hx  of deafness, stroke/extensive venous thrombosis on Eliquis  (discharged 02/10/2024), prior history of meningoencephalitis, hyperlipidemia and GERD. CT head with no acute process. 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 CTA and CTV obtained. Heparin  gtt initiated. NIH on Admission 25  Persistent CVST and venous infarct Patient seems more lethargic than before with mild right sided weakness, therapist also confirmed decline in rehab for the last 2-3 days Seems right sided weakness more so in standing or sitting position  Orthostatic vital neg On Na tab, with stable Na 142->140 Overnight EEG - no seizure -> d/c CT and CTA no significant change. However, reviewed with Dr. Del Warren neuroradiologist seems like there was new venous branch thrombosis at serial 5 images 31 and 32.  Repeat MRI reported left frontal evolving venous infarct with gliosis. However on review with Dr. Del Warren neuroradiologist, seems like there was new venous infarct at left frontal rolandic area.  Episode of severe R weakness overnight but improved fast Anemia Hb 13.4--12.2--12.3--10.9--11.6 Iron panel, B12 and FA unremarkable Hypotension BP 90s to 100s, no significant orthostatic component On IVF s/p IV bolus, also put on midodrine  5->10mg  tid Continue PT and OT Now on coumadin  with INR goal 2-3. He  did have INR 2.1-2.2 for the last 4 days, today 2.5. Recommend new INR goal 2.5-3.5. discussed with pharmacy.   Hx of CVST Admitted 02/01/2024 for CVST.  Status post IR for thrombectomy and discharged on Eliquis . EF 45-50%.  Readmitted 03/02/2024 for right-sided weakness.  CT head showed increased left frontal venous infarct.  CTV showed recurrent occlusive and subocclusive thrombi within the SSS posteriorly, extending to the confluence of sinus.  Thrombus extending into the medial aspect of the right transverse sinus.  Clot burden within the right sigmoid sinus has decreased.   Patient was concerning for dehydration at the time, and received fluid resuscitation.  Discharged in good condition Readmitted 03/08/2024 for left gaze, R weakness and neglect. CT Venogram Mild progression of extensive thrombus in the superior sagittal sinus posteriorly. Mild improvement of subocclusive thrombus in the  transverse and sigmoid sinuses bilaterally. Persistent bilateral cavernous sinus thrombosis. MRI  Evolving subacute venous ischemic changes involving the paramedian frontal lobes and likely the left occipital lobe, not significantly changed as compared to previous.  4/29 Head CT generalized brain swelling.  On personal review, seems more cerebral edema than 4/28. Put on 3% saline, although Na never real high, his symptoms essentially resolved the day after. LDL 58 and A1C 5.7. EEG LTM no seizure. Was initially resumed eliquis  but later changed to coumadin  with INR goal 2-3. Pt discharged to CIR Pt will follow up with stroke clinic Dr. Janett Warren on 05/05/24.   Hx of Stroke and viral meningitis January 27, 2024 right frontal 2-3 punctate infarcts, etiology: Likely due to infectious vasculitis vs. Small vessel disease in the setting of CNS infection  CSF at that time WBC 22, RBC 1330, protein 91, glucose 80, concerning for viral meningitis Completed course of Doxy No future LP recommended given no sign of CNS infection and low-lying cerebellar tonsils extending up to 9 mm below the foreman magnum  HA, possibly positional Per family and therapist, patient headache and functioning much worse on sitting up and standing up, more prominent recently. Had LP 01/2024, not sure if could be have slow CSF leak did not worse to current positional headache. Do not feel that this is laded to CSF leak On amitriptyline  25 D/c fioricet  Decrease depakote  to 750mg  Q8h to 250 mg twice daily to help with the vomiting Check depakote  level 26-> 41 Encourage hydration  Hypotension Pt BP low recently Orthostatic  vitals no obvious orthostatic hypotension S/p IV bolus Off IVF, encourage po intake On midodrine  5->10mg  tid  Hyperlipidemia Home meds:  Atorvastatin  80  LDL in 01/2024 279 In 02/2024 LDL 58, goal < 70 Continue statin   Other Active Problems Hypokalemia K 3.1 - 3.5 - 3.2 - 4.1 deafness  Hospital day # 9   Jonette Nestle DNP, ACNPC-AG  [facility]  Neuro[facility]  Patient continues to have headache mostly posterior.  He did have some nausea and threw up today.  Will recommend reduce the dose of Depakote  to 250 mg twice daily.  Patient does have low-lying cerebellar tonsils but these appear unchanged on the MRI done few days ago compared to the MRI in March hence I do not think it is a new finding of intracranial hypotension hence we will hold off on doing epidural patch.  Discussed with patient, wife and parents at the bedside. Greater than 50% time during this 35-minute visit was spent on counseling and coordination of care about his persistent headaches and review of MRI findings and discussion with patient and family and answering questions.  To contact Stroke Continuity provider, please refer to [email or web address]. After hours, contact General Neurology

## 2024-03-28 DIAGNOSIS — I951 Orthostatic hypotension: Secondary | ICD-10-CM

## 2024-03-28 DIAGNOSIS — K5900 Constipation, unspecified: Secondary | ICD-10-CM

## 2024-03-28 LAB — PROTIME-INR
INR: 2.5 — ABNORMAL HIGH (ref 0.8–1.2)
Prothrombin Time: 27.4 s — ABNORMAL HIGH (ref 11.4–15.2)

## 2024-03-28 LAB — CBC
HCT: 40.5 % (ref 39.0–52.0)
Hemoglobin: 12.6 g/dL — ABNORMAL LOW (ref 13.0–17.0)
MCH: 26.5 pg (ref 26.0–34.0)
MCHC: 31.1 g/dL (ref 30.0–36.0)
MCV: 85.1 fL (ref 80.0–100.0)
Platelets: 260 10*3/uL (ref 150–400)
RBC: 4.76 MIL/uL (ref 4.22–5.81)
RDW: 13 % (ref 11.5–15.5)
WBC: 5.3 10*3/uL (ref 4.0–10.5)
nRBC: 0 % (ref 0.0–0.2)

## 2024-03-28 LAB — VALPROIC ACID LEVEL: Valproic Acid Lvl: 12 ug/mL — ABNORMAL LOW (ref 50–100)

## 2024-03-28 LAB — BASIC METABOLIC PANEL WITH GFR
Anion gap: 11 (ref 5–15)
BUN: 9 mg/dL (ref 6–20)
CO2: 22 mmol/L (ref 22–32)
Calcium: 9.5 mg/dL (ref 8.9–10.3)
Chloride: 106 mmol/L (ref 98–111)
Creatinine, Ser: 0.93 mg/dL (ref 0.61–1.24)
GFR, Estimated: >60 mL/min (ref >60)
Glucose, Bld: 123 mg/dL — ABNORMAL HIGH (ref 70–99)
Potassium: 3.9 mmol/L (ref 3.5–5.1)
Sodium: 139 mmol/L (ref 135–145)

## 2024-03-28 MED ORDER — OFLOXACIN 0.3 % OP SOLN
5.0000 [drp] | Freq: Every day | OPHTHALMIC | Status: DC
  Administered 2024-03-28: 5 [drp] via OTIC
  Filled 2024-03-28: qty 5

## 2024-03-28 MED ORDER — AMOXICILLIN-POT CLAVULANATE 875-125 MG PO TABS
1.0000 | ORAL_TABLET | Freq: Two times a day (BID) | ORAL | Status: AC
  Administered 2024-03-28 – 2024-04-04 (×14): 1 via ORAL
  Filled 2024-03-28 (×14): qty 1

## 2024-03-28 MED ORDER — TIZANIDINE HCL 2 MG PO TABS
4.0000 mg | ORAL_TABLET | Freq: Every day | ORAL | Status: DC
  Administered 2024-03-28 – 2024-04-06 (×10): 4 mg via ORAL
  Filled 2024-03-28 (×10): qty 2

## 2024-03-28 MED ORDER — OFLOXACIN 0.3 % OP SOLN
5.0000 [drp] | Freq: Two times a day (BID) | OPHTHALMIC | Status: DC
Start: 2024-03-29 — End: 2024-03-28

## 2024-03-28 MED ORDER — SORBITOL 70 % SOLN
60.0000 mL | Freq: Once | Status: AC
Start: 2024-03-28 — End: 2024-03-28
  Administered 2024-03-28: 60 mL via ORAL
  Filled 2024-03-28: qty 60

## 2024-03-28 MED ORDER — OFLOXACIN 0.3 % OP SOLN
5.0000 [drp] | Freq: Two times a day (BID) | OPHTHALMIC | Status: AC
  Administered 2024-03-29 – 2024-04-04 (×14): 5 [drp] via OTIC

## 2024-03-28 MED ORDER — WARFARIN SODIUM 5 MG PO TABS
5.0000 mg | ORAL_TABLET | Freq: Once | ORAL | Status: AC
  Administered 2024-03-28: 5 mg via ORAL
  Filled 2024-03-28: qty 1

## 2024-03-28 NOTE — Progress Notes (Signed)
 STROKE TEAM PROGRESS NOTE   INTERIM HISTORY/SUBJECTIVE His mother is at the bedside.  Resting comfortably in the bed.  But still complains of headache or neck pain at this time.  He is currently getting Lidoderm  patches, Robaxin  as needed and Elavil  for his headache.  He has not responded in the past to Topamax  and Depakote  as well as Fioricet .  Will try Zanaflex.  The patient complains of headache lying in bed as well as standing up hence I do not think this is a low pressure headache and do not recommend epidural blood patch at this time  CBC    Component Value Date/Time   WBC 5.3 03/28/2024 1058   RBC 4.76 03/28/2024 1058   HGB 12.6 (L) 03/28/2024 1058   HCT 40.5 03/28/2024 1058   PLT 260 03/28/2024 1058   MCV 85.1 03/28/2024 1058   MCH 26.5 03/28/2024 1058   MCHC 31.1 03/28/2024 1058   RDW 13.0 03/28/2024 1058   LYMPHSABS 1.8 03/19/2024 0740   MONOABS 0.6 03/19/2024 0740   EOSABS 0.2 03/19/2024 0740   BASOSABS 0.0 03/19/2024 0740    BMET    Component Value Date/Time   NA 139 03/28/2024 1058   K 3.9 03/28/2024 1058   CL 106 03/28/2024 1058   CO2 22 03/28/2024 1058   GLUCOSE 123 (H) 03/28/2024 1058   BUN 9 03/28/2024 1058   CREATININE 0.93 03/28/2024 1058   CALCIUM  9.5 03/28/2024 1058   GFRNONAA >60 03/28/2024 1058    IMAGING past 24 hours No results found.    Vitals:   03/27/24 0400 03/27/24 1430 03/27/24 1954 03/28/24 0540  BP: 127/80 117/79 109/78 104/72  Pulse: 73 71 74 71  Resp: 18 18 17 18   Temp: 99.2 F (37.3 C) 97.7 F (36.5 C) 98.4 F (36.9 C) 98.7 F (37.1 C)  TempSrc: Oral Oral Oral Oral  SpO2: 96% 100% 98% 98%  Weight:      Height:         PHYSICAL EXAM General: Awake alert, well-developed patient in no acute distress CV: Regular rate and rhythm on monitor Respiratory:  Regular, unlabored respirations on room air GI: Abdomen soft and nontender  Neuro -awake and alert,  eyes open, baseline nonverbal and deafness. No gaze palsy, tracking  bilaterally, blinking to visual threat bilatearlly, PERRL. No facial droop. Tongue midline. LUE and LLE no drift, RUE and RLE drift  Sensation not cooperative, b/l FTN intact although slightly slow on the right, gait not tested.    ASSESSMENT/PLAN  [name] is a 33 y.o. 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 CT head with no acute process. 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 CTA and CTV obtained. Heparin  gtt initiated. NIH on Admission 25  Persistent CVST and venous infarct Patient seems more lethargic than before with mild right sided weakness, therapist also confirmed decline in rehab for the last 2-3 days Seems right sided weakness more so in standing or sitting position  Orthostatic vital neg On Na tab, with stable Na 142->140 Overnight EEG - no seizure -> d/c CT and CTA no significant change. However, reviewed with Dr. Del Favia neuroradiologist seems like there was new venous branch thrombosis at serial 5 images 31 and 32.  Repeat MRI reported left frontal evolving venous infarct with gliosis. However on review with Dr. Del Favia neuroradiologist, seems like there was new  venous infarct at left frontal rolandic area.  Episode of severe R weakness overnight but improved fast Anemia Hb 13.4--12.2--12.3--10.9--11.6 Iron panel, B12 and FA unremarkable Hypotension BP 90s to 100s, no significant orthostatic component On IVF s/p IV bolus, also put on midodrine  5->10mg  tid Continue PT and OT Now on coumadin  with INR goal 2-3. He did have INR 2.1-2.2 for the last 4 days, today 2.5. Recommend new INR goal 2.5-3.5. discussed with pharmacy.   Hx of CVST Admitted 02/01/2024 for CVST.  Status post IR for thrombectomy and discharged on Eliquis . EF 45-50%.  Readmitted 03/02/2024 for right-sided weakness.  CT  head showed increased left frontal venous infarct.  CTV showed recurrent occlusive and subocclusive thrombi within the SSS posteriorly, extending to the confluence of sinus.  Thrombus extending into the medial aspect of the right transverse sinus.  Clot burden within the right sigmoid sinus has decreased.  Patient was concerning for dehydration at the time, and received fluid resuscitation.  Discharged in good condition Readmitted 03/08/2024 for left gaze, R weakness and neglect. CT Venogram Mild progression of extensive thrombus in the superior sagittal sinus posteriorly. Mild improvement of subocclusive thrombus in the  transverse and sigmoid sinuses bilaterally. Persistent bilateral cavernous sinus thrombosis. MRI  Evolving subacute venous ischemic changes involving the paramedian frontal lobes and likely the left occipital lobe, not significantly changed as compared to previous.  4/29 Head CT generalized brain swelling.  On personal review, seems more cerebral edema than 4/28. Put on 3% saline, although Na never real high, his symptoms essentially resolved the day after. LDL 58 and A1C 5.7. EEG LTM no seizure. Was initially resumed eliquis  but later changed to coumadin  with INR goal 2-3. Pt discharged to CIR Pt will follow up with stroke clinic Dr. Janett Medin on 05/05/24.   Hx of Stroke and viral meningitis January 27, 2024 right frontal 2-3 punctate infarcts, etiology: Likely due to infectious vasculitis vs. Small vessel disease in the setting of CNS infection  CSF at that time WBC 22, RBC 1330, protein 91, glucose 80, concerning for viral meningitis Completed course of Doxy No future LP recommended given no sign of CNS infection and low-lying cerebellar tonsils extending up to 9 mm below the foreman magnum  HA, possibly positional Per family and therapist, patient headache and functioning much worse on sitting up and standing up, more prominent recently. Had LP 01/2024, not sure if could be have slow CSF  leak did not worse to current positional headache. Do not feel that this is laded to CSF leak On amitriptyline  25 D/c fioricet  Decrease depakote  to 750mg  Q8h to 250 mg twice daily to help with the vomiting Check depakote  level 26-> 41 Encourage hydration  Hypotension Pt BP low recently Orthostatic vitals no obvious orthostatic hypotension S/p IV bolus Off IVF, encourage po intake On midodrine  5->10mg  tid  Hyperlipidemia Home meds:  Atorvastatin  80  LDL in 01/2024 279 In 02/2024 LDL 58, goal < 70 Continue statin   Other Active Problems Hypokalemia K 3.1 - 3.5 - 3.2 - 4.1 deafness  Hospital day # 10   Patient continues to have new onset daily persistent headache for the last couple of months since onset of this venous sinus thrombosis.  Exact description of the headache and features are difficult to interpret given patient's language and sign  language barrier issues.  Patient does have low-lying cerebellar tonsils but these appear unchanged on the MRI done few days ago compared to the MRI in March hence I do  not think it is a new finding of intracranial hypotension hence we will hold off on doing epidural patch.  Recommend add Zanaflex 5 mg at night for new onset daily persistent headache.  Discussed with patient,  and father at the bedside.  Discussed with Dr. Renella Cart Greater than 50% time during this 35-minute visit was spent on counseling and coordination of care about his persistent headaches and review of MRI findings and discussion with patient and family and answering questions.  Ardella Beaver, MD To contact Stroke Continuity provider, please refer to [email or web address]. After hours, contact General Neurology

## 2024-03-28 NOTE — Plan of Care (Signed)
  Problem: Consults Goal: RH STROKE PATIENT EDUCATION Description: See Patient Education module for education specifics  Outcome: Progressing   Problem: RH BOWEL ELIMINATION Goal: RH STG MANAGE BOWEL WITH ASSISTANCE Description: STG Manage Bowel with  minimal Assistance. Outcome: Progressing   Problem: RH BLADDER ELIMINATION Goal: RH STG MANAGE BLADDER WITH ASSISTANCE Description: STG Manage Bladder With  minimal Assistance Outcome: Progressing   Problem: RH SKIN INTEGRITY Goal: RH STG SKIN FREE OF INFECTION/BREAKDOWN Description: Manage skin free of infection/ breakdown with minimal assistance Outcome: Progressing   Problem: RH SAFETY Goal: RH STG ADHERE TO SAFETY PRECAUTIONS W/ASSISTANCE/DEVICE Description: STG Adhere to Safety Precautions With minimal  Assistance/Device. Outcome: Progressing   Problem: RH COGNITION-NURSING Goal: RH STG USES MEMORY AIDS/STRATEGIES W/ASSIST TO PROBLEM SOLVE Description: STG Uses Memory Aids/Strategies With minimal Assistance to Problem Solve. Outcome: Progressing   Problem: RH PAIN MANAGEMENT Goal: RH STG PAIN MANAGED AT OR BELOW PT'S PAIN GOAL Description: <4 w/ prns Outcome: Progressing   Problem: RH KNOWLEDGE DEFICIT Goal: RH STG INCREASE KNOWLEGDE OF HYPERLIPIDEMIA Description: Manage increase knowledge of hyperlipidemia with minimal assistance from spouse and family members using educational materials provided Outcome: Progressing Goal: RH STG INCREASE KNOWLEDGE OF STROKE PROPHYLAXIS Description: Manage increase knowledge of stroke prohylaxis with minimal assistance from spouse and family members using educational materials provided Outcome: Progressing

## 2024-03-28 NOTE — Progress Notes (Addendum)
 PROGRESS NOTE   Subjective/Complaints: Patient continues to indicate headaches.  Continues to be very difficult to communicate with him.  Discussed with neurology in person, they are not suspecting low pressure headache and do not recommend epidural blood patch.  ROS: Limited by deafness  Objective:   No results found.   Recent Labs    03/27/24 0553 03/28/24 1058  WBC 6.6 5.3  HGB 12.9* 12.6*  HCT 40.0 40.5  PLT 280 260   Recent Labs    03/27/24 0553 03/28/24 1058  NA 143 139  K 3.8 3.9  CL 105 106  CO2 23 22  GLUCOSE 143* 123*  BUN 7 9  CREATININE 0.83 0.93  CALCIUM  10.0 9.5     Intake/Output Summary (Last 24 hours) at 03/28/2024 1259 Last data filed at 03/27/2024 1955 Gross per 24 hour  Intake 236 ml  Output 200 ml  Net 36 ml          Physical Exam: Vital Signs Blood pressure 104/72, pulse 71, temperature 98.7 F (37.1 C), temperature source Oral, resp. rate 18, height 5\' 4"  (1.626 m), weight 65.3 kg, SpO2 98%.       General: awake, alert, appropriate,  this AM; supine in bed; one family member at bedside, different family member on her speakerphone HENT: conjugate gaze; oropharynx moist CV: regular rate and rhythm; no JVD Pulmonary: CTA B/L; no W/R/R- good air movement GI: soft, NT, ND, (+)BS Psychiatric: appropriate- flat   MSK- neck tightness  in upper traps, splenius capitus and levators and scalenes- much looser this AM still  Musculoskeletal:        General: Normal range of motion.     Cervical back: Neck supple. No tenderness.     Comments: Moving all 4 extremities in bed Skin:    General: Skin is warm and dry.     Comments:  IV out of neck is gone- wounds healing  Neurological:     Mental Status: He is alert.     Comments: [name] makes eye contact with examiner.  Exam limited by language barrier.  Follows simple demonstrated commands.  No facial droop. Mild  ataxia Mild R inattention Mild ot moderate searching pattern on B/L finger to nose   Neuro exam appears consistent with prior but difficult to test due to communication 5/17  Assessment/Plan: 1. Functional deficits which require 3+ hours per day of interdisciplinary therapy in a comprehensive inpatient rehab setting. Physiatrist is providing close team supervision and 24 hour management of active medical problems listed below. Physiatrist and rehab team continue to assess barriers to discharge/monitor patient progress toward functional and medical goals  Care Tool:  Bathing    Body parts bathed by patient: Chest, Abdomen, Front perineal area, Buttocks, Right upper leg, Left upper leg, Right lower leg, Left lower leg, Face, Right arm, Left arm         Bathing assist Assist Level: Moderate Assistance - Patient 50 - 74%     Upper Body Dressing/Undressing Upper body dressing   What is the patient wearing?: Pull over shirt    Upper body assist Assist Level: Moderate Assistance - Patient 50 - 74%  Lower Body Dressing/Undressing Lower body dressing      What is the patient wearing?: Pants     Lower body assist Assist for lower body dressing: Maximal Assistance - Patient 25 - 49%     Toileting Toileting    Toileting assist Assist for toileting: Maximal Assistance - Patient 25 - 49%     Transfers Chair/bed transfer  Transfers assist     Chair/bed transfer assist level: Moderate Assistance - Patient 50 - 74%     Locomotion Ambulation   Ambulation assist      Assist level: Moderate Assistance - Patient 50 - 74% Assistive device: Hand held assist Max distance: 60   Walk 10 feet activity   Assist     Assist level: Moderate Assistance - Patient - 50 - 74% Assistive device: Hand held assist   Walk 50 feet activity   Assist Walk 50 feet with 2 turns activity did not occur: Safety/medical concerns  Assist level: Moderate Assistance - Patient - 50 -  74% Assistive device: Hand held assist    Walk 150 feet activity   Assist Walk 150 feet activity did not occur: Safety/medical concerns         Walk 10 feet on uneven surface  activity   Assist Walk 10 feet on uneven surfaces activity did not occur: Safety/medical concerns         Wheelchair     Assist Is the patient using a wheelchair?: Yes Type of Wheelchair: Manual    Wheelchair assist level: Minimal Assistance - Patient > 75% Max wheelchair distance: 25    Wheelchair 50 feet with 2 turns activity    Assist        Assist Level: Moderate Assistance - Patient 50 - 74%   Wheelchair 150 feet activity     Assist      Assist Level: Maximal Assistance - Patient 25 - 49%   Blood pressure 104/72, pulse 71, temperature 98.7 F (37.1 C), temperature source Oral, resp. rate 18, height 5\' 4"  (1.626 m), weight 65.3 kg, SpO2 98%.  Medical Problem List and Plan: 1. Functional deficits secondary to persistent dural venous sinus thrombosis             -patient may  shower             -ELOS/Goals: min A- 18-21 days  D/c 5/28  Pt not making a lot of gains and has gone backwards in function  Con't CIR PT, OT and SLP- having difficulties with weakness and OH as well as HA's 2.  Antithrombotics: -DVT/anticoagulation:  Pharmaceutical: Coumadin  5/9- Last INR 2.3- managed by pharmacy 5/13- INR 2.1 5/15- INR 2.5 5/16- new goal INR 2.5 to 3.5 since had new stroke per Neuro             -antiplatelet therapy: N/A 3. Pain Management: Fioricet  as needed for refractory headaches, Tylenol  as needed  5/9- having HA's- they attempted Topamax - might need to try Elavil - if doesn't improve by tomorrow, will add Elavil  25 mg at bedtime  5/10- started Elavil  25 mg at bedtime- and lidoderm  patches- 2 patches 8pm to 8am- increased Fioricet  to q4 hours prn- cannot have Topamax  due to low Bicarb levels  5/11- HA somewhat better this AM per son by phone- pt continues to point at  10/10- but muscles in neck much looser  5/13- HA 5/10 this AM- might benefit from TrP Injections but hard to assess consent?  5/14- will d/w family and pt tomorrow  5/15- pt's Brother- told me had severe HA yesterday- Fioricet  taken off by Neurology but they added/increased Depakote  for HA control and possible seizures coverage-   5/16- increased Elavil  to 50 mg at bedtime for HA's- has tylenol  as needed otherwise- cannot do Trp injections since INR is going too high  5/18 discussed with neurology, do not suspect related to intracranial hypotension-no plan for epidural patch at this time.  Will start Zanaflex. 4. Mood/Behavior/Sleep: Provide emotional support             -antipsychotic agents: N/A 5. Neuropsych/cognition: This patient is? capable of making decisions on his own behalf. 6. Skin/Wound Care: Routine skin checks 7. Fluids/Electrolytes/Nutrition: Routine in and outs with follow-up chemistries 8.  Cerebral edema.  Hypertonic saline discontinued 9.  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  Status post IR for thrombectomy was discharged on Eliquis - now changed to Coumadin  10.  History of stroke/recent viral meningitis.  Completed course of Doxy 11.  Refractory headaches.  Medrol  Dosepak.  Topamax  discontinued due to low bicarb- might need to start Elavil , since HA's still 6/10  5/9- still having constant HA's  5/10- pain up to 10/10 today- got head CT due to 10/10 HA- looks OK- no changes- reexamined pt's neck- very tight musculature- will order Robaxin  750 mg q6 hours prn for neck/posterior HA's- as well as lidocaine  patches nightly- added Elavil  25 mg at bedtime for prevention- and asked therapy to do myofascial release for pt.   5/11- muscles looser- HA appears to be less- appears more comfortable  5/12 not using robaxin  frequently, will schedule AM dose. family indicates headaches/neck pain overall improving  5/13- HA down to 5/10 this AM  5/16- Fioricet  stopped by Neuro- due  to risk of rebound HA's- added Elavil  and then increased to 50 mg at bedtime today.   -cannot do Trp Injections since his INR is going to be 2.5 to 3.5- and that's too high for me to feel comfortable doing trp injections  5/17 neurology following, decreased depakote , encouraged hydration   5/18 discussed with neurology, Zanaflex was started, consider botox injections for HA 12.  Hyperlipidemia.  Lipitor  13.  Congenital Deafness.  Patient does use some sign language- but mainly gestures per ASL interpretor 14.  GERD.  Protonix   15. Abd pain/Constipation  5/9- pt clear having constant abd pain- will get CT of abd/pelvis with contrast to assess abd pain   5/10- Abd CT looks good  -LBM 3 days ago- ordered Sorbitol  for constipation  5/11- LBM yesterday  5/12 LBM documented yesterday  5/13- LBM 2 days ago- if no BM by tomorrow, will intervene  5/15- LBM yesterday-so abd pain is better  5/16- LBM 2 days ago  5/17 Will add sorbitol  30ml  5/18 sorbitol  60ml 16. Anemia. Mild. -5/12 HGB 12.3, stable continue to monitor  17. Code stroke  5/14- Was (-)- but being assessed for seizures.  5/15- still has mild R sided weakness-  EEG showed cortical dysfunction on L side- but no seizures seen- but MRI looked stable- will wait for Neuro note today- had another episode yesterday it appears based on nursing notes, that was weaker standing/sitting than laying down-it appears that this occurs almost every time up with therapy- much weaker than when laying down. 5/16- Neuro spoke with radiology and they said he did actually have a new small stroke that would explain weakness of R side   5/17 neuro following, appears Dr. Janett Medin may see him today 45. Orthostatic  hypotension  5/15- On Midodrine  5 mg TID and on IVF's- 90s systolic- laying down on chart- he's not dry based on BUN/Cr ratio- Off IVF's but if BP stays low, will restart IVF's.  - will increase Midodrine  to 7.5 mg TID  5/16- will d/w therapy and see how  it's doing for him to see if need to increase midodrine  again.   5/18 BP stable, monitor how he does with therapy tomorrow       03/28/2024    5:40 AM 03/27/2024    7:54 PM 03/27/2024    2:30 PM  Vitals with BMI  Systolic 104 109 161  Diastolic 72 78 79  Pulse 71 74 71   Addendum milky discharge from his L ear and some clear discharge on his ear noted by nursing. Unable to see TM but does have thick milky looking discharge L ear.  Will start augmentin and ofloxacin drops for L ear. May be affecting balance, consider ENT outpatient f/u.  LOS: 10 days A FACE TO FACE EVALUATION WAS PERFORMED  Lylia Sand 03/28/2024, 12:59 PM

## 2024-03-28 NOTE — Progress Notes (Signed)
 PHARMACY - ANTICOAGULATION CONSULT NOTE  Pharmacy Consult for Warfarin Indication:  central venous sinus thrombosis  No Known Allergies  Patient Measurements: Height: 5\' 4"  (162.6 cm) Weight: 65.3 kg (143 lb 15.4 oz) IBW/kg (Calculated) : 59.2 HEPARIN  DW (KG): 65.3  Vital Signs: Temp: 98.7 F (37.1 C) (05/18 0540) Temp Source: Oral (05/18 0540) BP: 104/72 (05/18 0540) Pulse Rate: 71 (05/18 0540)  Labs: Recent Labs    03/26/24 0954 03/27/24 0553 03/28/24 1058  HGB 12.4* 12.9* 12.6*  HCT 38.8* 40.0 40.5  PLT 253 280 260  LABPROT 23.4* 22.2* 27.4*  INR 2.1* 1.9* 2.5*  CREATININE 0.89 0.83  --     Estimated Creatinine Clearance: 105 mL/min (by C-G formula based on SCr of 0.83 mg/dL).  Assessment: 11 YOM with history of central venous sinus thrombosis on Eliquis  PTA. He returned with new onset of R-sided weakness and cerebral edema.  Patient was initially on IV heparin , then transitioned back to PTA Eliquis .  Pharmacy consulted to transition patient to IV heparin  bridge to Warfarin starting 5/3. IV heparin  stopped on 5/6 with therapeutic INR and currently on Warfarin only.   New INR goal of 2.5 to 3.5   Goal of Therapy:  INR goal 2.5 to 3.5 Monitor platelets by anticoagulation protocol: Yes   Plan:  Repeat Warfarin 5 mg po x 1 today Daily PT/INR Monitor for signs/symptoms of bleeding.  Thank you Lennice Quivers, PharmD  03/28/2024 11:36 AM

## 2024-03-29 LAB — CBC
HCT: 40.6 % (ref 39.0–52.0)
Hemoglobin: 13.1 g/dL (ref 13.0–17.0)
MCH: 26.6 pg (ref 26.0–34.0)
MCHC: 32.3 g/dL (ref 30.0–36.0)
MCV: 82.4 fL (ref 80.0–100.0)
Platelets: 286 10*3/uL (ref 150–400)
RBC: 4.93 MIL/uL (ref 4.22–5.81)
RDW: 12.7 % (ref 11.5–15.5)
WBC: 7.2 10*3/uL (ref 4.0–10.5)
nRBC: 0 % (ref 0.0–0.2)

## 2024-03-29 LAB — BASIC METABOLIC PANEL WITH GFR
Anion gap: 8 (ref 5–15)
BUN: 8 mg/dL (ref 6–20)
CO2: 25 mmol/L (ref 22–32)
Calcium: 9.7 mg/dL (ref 8.9–10.3)
Chloride: 105 mmol/L (ref 98–111)
Creatinine, Ser: 0.92 mg/dL (ref 0.61–1.24)
GFR, Estimated: >60 mL/min (ref >60)
Glucose, Bld: 136 mg/dL — ABNORMAL HIGH (ref 70–99)
Potassium: 3.5 mmol/L (ref 3.5–5.1)
Sodium: 138 mmol/L (ref 135–145)

## 2024-03-29 LAB — PROTIME-INR
INR: 3 — ABNORMAL HIGH (ref 0.8–1.2)
Prothrombin Time: 31 s — ABNORMAL HIGH (ref 11.4–15.2)

## 2024-03-29 MED ORDER — WARFARIN SODIUM 1 MG PO TABS
1.0000 mg | ORAL_TABLET | Freq: Once | ORAL | Status: AC
  Administered 2024-03-29: 1 mg via ORAL
  Filled 2024-03-29: qty 1

## 2024-03-29 NOTE — Plan of Care (Signed)
 downgraded d/t lack of progress  Problem: RH Balance Goal: LTG: Patient will maintain dynamic sitting balance (OT) Description: LTG:  Patient will maintain dynamic sitting balance with assistance during activities of daily living (OT) Flowsheets (Taken 03/29/2024 1550) LTG: Pt will maintain dynamic sitting balance during ADLs with: (downgraded d/t lack of progress) Minimal Assistance - Patient > 75% Goal: LTG Patient will maintain dynamic standing with ADLs (OT) Description: LTG:  Patient will maintain dynamic standing balance with assist during activities of daily living (OT)  Flowsheets (Taken 03/29/2024 1550) LTG: Pt will maintain dynamic standing balance during ADLs with: (downgraded d/t lack of progress) Moderate Assistance - Patient 50 - 74%   Problem: Sit to Stand Goal: LTG:  Patient will perform sit to stand in prep for activites of daily living with assistance level (OT) Description: LTG:  Patient will perform sit to stand in prep for activites of daily living with assistance level (OT) Flowsheets (Taken 03/29/2024 1550) LTG: PT will perform sit to stand in prep for activites of daily living with assistance level: (downgraded d/t lack of progress) Minimal Assistance - Patient > 75%   Problem: RH Bathing Goal: LTG Patient will bathe all body parts with assist levels (OT) Description: LTG: Patient will bathe all body parts with assist levels (OT) Flowsheets (Taken 03/29/2024 1550) LTG: Pt will perform bathing with assistance level/cueing: (downgraded d/t lack of progress) Moderate Assistance - Patient 50 - 74%   Problem: RH Dressing Goal: LTG Patient will perform upper body dressing (OT) Description: LTG Patient will perform upper body dressing with assist, with/without cues (OT). Flowsheets (Taken 03/29/2024 1550) LTG: Pt will perform upper body dressing with assistance level of: (downgraded d/t lack of progress) Minimal Assistance - Patient > 75% Goal: LTG Patient will perform lower  body dressing w/assist (OT) Description: LTG: Patient will perform lower body dressing with assist, with/without cues in positioning using equipment (OT) Flowsheets (Taken 03/29/2024 1550) LTG: Pt will perform lower body dressing with assistance level of: (downgraded d/t lack of progress) Moderate Assistance - Patient 50 - 74%   Problem: RH Toileting Goal: LTG Patient will perform toileting task (3/3 steps) with assistance level (OT) Description: LTG: Patient will perform toileting task (3/3 steps) with assistance level (OT)  Flowsheets (Taken 03/29/2024 1550) LTG: Pt will perform toileting task (3/3 steps) with assistance level: (downgraded d/t lack of progress) Moderate Assistance - Patient 50 - 74%   Problem: RH Toilet Transfers Goal: LTG Patient will perform toilet transfers w/assist (OT) Description: LTG: Patient will perform toilet transfers with assist, with/without cues using equipment (OT) Flowsheets (Taken 03/29/2024 1550) LTG: Pt will perform toilet transfers with assistance level of: (downgraded d/t lack of progress) Minimal Assistance - Patient > 75%   Problem: RH Tub/Shower Transfers Goal: LTG Patient will perform tub/shower transfers w/assist (OT) Description: LTG: Patient will perform tub/shower transfers with assist, with/without cues using equipment (OT) Flowsheets (Taken 03/29/2024 1550) LTG: Pt will perform tub/shower stall transfers with assistance level of: (downgraded d/t lack of progress) Minimal Assistance - Patient > 75%   Problem: RH Furniture Transfers Goal: LTG Patient will perform furniture transfers w/assist (OT/PT) Description: LTG: Patient will perform furniture transfers  with assistance (OT/PT). Flowsheets (Taken 03/29/2024 1550) LTG: Pt will perform furniture transfers with assist:: (downgraded d/t lack of progress) Minimal Assistance - Patient > 75%

## 2024-03-29 NOTE — Progress Notes (Signed)
 PHARMACY - ANTICOAGULATION CONSULT NOTE  Pharmacy Consult for Warfarin Indication:  central venous sinus thrombosis  No Known Allergies  Patient Measurements: Height: 5\' 4"  (162.6 cm) Weight: 65.3 kg (143 lb 15.4 oz) IBW/kg (Calculated) : 59.2 HEPARIN  DW (KG): 65.3  Vital Signs: Temp: 98.2 F (36.8 C) (05/19 0439) Temp Source: Oral (05/19 0439) BP: 120/82 (05/19 0439) Pulse Rate: 74 (05/19 0439)  Labs: Recent Labs    03/27/24 0553 03/28/24 1058 03/29/24 0647  HGB 12.9* 12.6* 13.1  HCT 40.0 40.5 40.6  PLT 280 260 286  LABPROT 22.2* 27.4* 31.0*  INR 1.9* 2.5* 3.0*  CREATININE 0.83 0.93 0.92    Estimated Creatinine Clearance: 94.7 mL/min (by C-G formula based on SCr of 0.92 mg/dL).  Assessment: Samuel Warren with history of central venous sinus thrombosis on Eliquis  PTA. He returned with new onset of R-sided weakness and cerebral edema.  Patient was initially on IV heparin , then transitioned back to PTA Eliquis .  Pharmacy consulted to transition patient to IV heparin  bridge to Warfarin starting 5/3. IV heparin  stopped on 5/6 with therapeutic INR and currently on Warfarin only.   New INR goal of 2.5 to 3.5  INR now up to 3   Goal of Therapy:  INR goal 2.5 to 3.5 Monitor platelets by anticoagulation protocol: Yes   Plan:  Warfarin 1 mg po x 1 (to slow INR increase) Daily PT/INR Monitor for signs/symptoms of bleeding.  Thank you Lennice Quivers, PharmD  03/29/2024 9:Samuel AM

## 2024-03-29 NOTE — Progress Notes (Signed)
 Occupational Therapy Note  Patient Details  Name: Samuel Warren MRN: [id number] Date of Birth: 1991-11-02   Pt's plan of care adjusted to 15/7 after speaking with care team and discussed with MD as pt currently unable to tolerate current therapy schedule with OT, PT, and SLP.     Nila Barth, OTD, OTR/L 03/29/2024, 8:34 AM

## 2024-03-29 NOTE — Progress Notes (Signed)
 PROGRESS NOTE   Subjective/Complaints: Pt reports 5/10 HA' so doing better than last week.  Therapy rec 15/7 due to medical issues LBM 5/18  Slept well per nursing;  Had milky yellow drainage from ear- put on Augmentin  and ear drops.   Per brother, HA has been somewhat better  ROS: Limited by language barrier and deafness  Objective:   No results found.   Recent Labs    03/28/24 1058 03/29/24 0647  WBC 5.3 7.2  HGB 12.6* 13.1  HCT 40.5 40.6  PLT 260 286   Recent Labs    03/28/24 1058 03/29/24 0647  NA 139 138  K 3.9 3.5  CL 106 105  CO2 22 25  GLUCOSE 123* 136*  BUN 9 8  CREATININE 0.93 0.92  CALCIUM  9.5 9.7    No intake or output data in the 24 hours ending 03/29/24 0819         Physical Exam: Vital Signs Blood pressure 120/82, pulse 74, temperature 98.2 F (36.8 C), temperature source Oral, resp. rate 18, height 5\' 4"  (1.626 m), weight 65.3 kg, SpO2 99%.       General: awake, but sleepy- able to sign 5/10 HA; brother? At bedside; NAD HENT: conjugate gaze; oropharynx moist CV: regular rate and rhythm; no JVD Pulmonary: CTA B/L; no W/R/R- good air movement GI: soft, NT, ND, (+)BS Psychiatric: sleepy- just woke him up Neurological: deaf- hard to communicate since doesn't speak Enlgish MSK- neck tightness  in upper traps, splenius capitus and levators and scalenes- much looser this AM still  Musculoskeletal:        General: Normal range of motion.     Cervical back: Neck supple. No tenderness.     Comments: Moving all 4 extremities in bed Skin:    General: Skin is warm and dry.     Comments:  IV out of neck is gone- wounds healing  Neurological:     Mental Status: He is alert.     Comments: [name] makes eye contact with examiner.  Exam limited by language barrier.  Follows simple demonstrated commands.  No facial droop. Mild ataxia Mild R inattention Mild ot moderate  searching pattern on B/L finger to nose   Neuro exam appears consistent with prior but difficult to test due to communication 5/17  Assessment/Plan: 1. Functional deficits which require 3+ hours per day of interdisciplinary therapy in a comprehensive inpatient rehab setting. Physiatrist is providing close team supervision and 24 hour management of active medical problems listed below. Physiatrist and rehab team continue to assess barriers to discharge/monitor patient progress toward functional and medical goals  Care Tool:  Bathing    Body parts bathed by patient: Chest, Abdomen, Front perineal area, Buttocks, Right upper leg, Left upper leg, Right lower leg, Left lower leg, Face, Right arm, Left arm         Bathing assist Assist Level: Moderate Assistance - Patient 50 - 74%     Upper Body Dressing/Undressing Upper body dressing   What is the patient wearing?: Pull over shirt    Upper body assist Assist Level: Moderate Assistance - Patient 50 - 74%    Lower Body  Dressing/Undressing Lower body dressing      What is the patient wearing?: Pants     Lower body assist Assist for lower body dressing: Maximal Assistance - Patient 25 - 49%     Toileting Toileting    Toileting assist Assist for toileting: Maximal Assistance - Patient 25 - 49%     Transfers Chair/bed transfer  Transfers assist     Chair/bed transfer assist level: Moderate Assistance - Patient 50 - 74%     Locomotion Ambulation   Ambulation assist      Assist level: Moderate Assistance - Patient 50 - 74% Assistive device: Hand held assist Max distance: 60   Walk 10 feet activity   Assist     Assist level: Moderate Assistance - Patient - 50 - 74% Assistive device: Hand held assist   Walk 50 feet activity   Assist Walk 50 feet with 2 turns activity did not occur: Safety/medical concerns  Assist level: Moderate Assistance - Patient - 50 - 74% Assistive device: Hand held assist     Walk 150 feet activity   Assist Walk 150 feet activity did not occur: Safety/medical concerns         Walk 10 feet on uneven surface  activity   Assist Walk 10 feet on uneven surfaces activity did not occur: Safety/medical concerns         Wheelchair     Assist Is the patient using a wheelchair?: Yes Type of Wheelchair: Manual    Wheelchair assist level: Minimal Assistance - Patient > 75% Max wheelchair distance: 25    Wheelchair 50 feet with 2 turns activity    Assist        Assist Level: Moderate Assistance - Patient 50 - 74%   Wheelchair 150 feet activity     Assist      Assist Level: Maximal Assistance - Patient 25 - 49%   Blood pressure 120/82, pulse 74, temperature 98.2 F (36.8 C), temperature source Oral, resp. rate 18, height 5\' 4"  (1.626 m), weight 65.3 kg, SpO2 99%.  Medical Problem List and Plan: 1. Functional deficits secondary to persistent dural venous sinus thrombosis             -patient may  shower             -ELOS/Goals: min A- 18-21 days  D/c 5/28  Pt not making a lot of gains and has gone backwards in function  Con't CIR PT and OT  Doesn't need blood patch per Dr Janett Medin  -will change to 15/7 per therapy 2.  Antithrombotics: -DVT/anticoagulation:  Pharmaceutical: Coumadin  5/9- Last INR 2.3- managed by pharmacy 5/13- INR 2.1 5/15- INR 2.5 5/16- new goal INR 2.5 to 3.5 since had new stroke per Neuro 5/19- INR 3.0 this AM             -antiplatelet therapy: N/A 3. Pain Management: Fioricet  as needed for refractory headaches, Tylenol  as needed  5/9- having HA's- they attempted Topamax - might need to try Elavil - if doesn't improve by tomorrow, will add Elavil  25 mg at bedtime  5/10- started Elavil  25 mg at bedtime- and lidoderm  patches- 2 patches 8pm to 8am- increased Fioricet  to q4 hours prn- cannot have Topamax  due to low Bicarb levels  5/11- HA somewhat better this AM per son by phone- pt continues to point at 10/10-  but muscles in neck much looser  5/13- HA 5/10 this AM- might benefit from TrP Injections but hard to assess consent?  5/14-  will d/w family and pt tomorrow  5/15- pt's Brother- told me had severe HA yesterday- Fioricet  taken off by Neurology but they added/increased Depakote  for HA control and possible seizures coverage-   5/16- increased Elavil  to 50 mg at bedtime for HA's- has tylenol  as needed otherwise- cannot do Trp injections since INR is going too high  5/18 discussed with neurology, do not suspect related to intracranial hypotension-no plan for epidural patch at this time.  Will start Zanaflex .  5/19- HA down to 5/10 this AM- if INR isn't higher, might try just upper trap Trp injections tomorrow? 4. Mood/Behavior/Sleep: Provide emotional support             -antipsychotic agents: N/A 5. Neuropsych/cognition: This patient is? capable of making decisions on his own behalf. 6. Skin/Wound Care: Routine skin checks 7. Fluids/Electrolytes/Nutrition: Routine in and outs with follow-up chemistries 8.  Cerebral edema.  Hypertonic saline discontinued 9.  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  Status post IR for thrombectomy was discharged on Eliquis - now changed to Coumadin  10.  History of stroke/recent viral meningitis.  Completed course of Doxy 11.  Refractory headaches.  Medrol  Dosepak.  Topamax  discontinued due to low bicarb- might need to start Elavil , since HA's still 6/10  5/9- still having constant HA's  5/10- pain up to 10/10 today- got head CT due to 10/10 HA- looks OK- no changes- reexamined pt's neck- very tight musculature- will order Robaxin  750 mg q6 hours prn for neck/posterior HA's- as well as lidocaine  patches nightly- added Elavil  25 mg at bedtime for prevention- and asked therapy to do myofascial release for pt.   5/11- muscles looser- HA appears to be less- appears more comfortable  5/12 not using robaxin  frequently, will schedule AM dose. family indicates  headaches/neck pain overall improving  5/13- HA down to 5/10 this AM  5/16- Fioricet  stopped by Neuro- due to risk of rebound HA's- added Elavil  and then increased to 50 mg at bedtime today.   -cannot do Trp Injections since his INR is going to be 2.5 to 3.5- and that's too high for me to feel comfortable doing trp injections  5/17 neurology following, decreased depakote , encouraged hydration   5/18 discussed with neurology, Zanaflex  was started, consider botox injections for HA 12.  Hyperlipidemia.  Lipitor  13.  Congenital Deafness.  Patient does use some sign language- but mainly gestures per ASL interpretor 14.  GERD.  Protonix   15. Abd pain/Constipation  5/9- pt clear having constant abd pain- will get CT of abd/pelvis with contrast to assess abd pain   5/10- Abd CT looks good  -LBM 3 days ago- ordered Sorbitol  for constipation  5/11- LBM yesterday  5/12 LBM documented yesterday  5/13- LBM 2 days ago- if no BM by tomorrow, will intervene  5/15- LBM yesterday-so abd pain is better  5/16- LBM 2 days ago  5/17 Will add sorbitol  30ml  5/18 sorbitol  60ml  5/19- LBM yesterday after sorbitol  16. Anemia. Mild. -5/12 HGB 12.3, stable continue to monitor  17. Code stroke  5/14- Was (-)- but being assessed for seizures.  5/15- still has mild R sided weakness-  EEG showed cortical dysfunction on L side- but no seizures seen- but MRI looked stable- will wait for Neuro note today- had another episode yesterday it appears based on nursing notes, that was weaker standing/sitting than laying down-it appears that this occurs almost every time up with therapy- much weaker than when laying down. 5/16- Neuro spoke with  radiology and they said he did actually have a new small stroke that would explain weakness of R side   5/17 neuro following, appears Dr. Janett Medin may see him today 78. Orthostatic hypotension  5/15- On Midodrine  5 mg TID and on IVF's- 90s systolic- laying down on chart- he's not dry based on  BUN/Cr ratio- Off IVF's but if BP stays low, will restart IVF's.  - will increase Midodrine  to 7.5 mg TID  5/16- will d/w therapy and see how it's doing for him to see if need to increase midodrine  again.   5/18 BP stable, monitor how he does with therapy tomorrow  5/19- changed to 15/7 19. milky discharge from his L ear and some clear discharge on his ear noted by nursing. Unable to see TM but does have thick milky looking discharge L ear.  Will start augmentin  and ofloxacin  drops for L ear. May be affecting balance, consider ENT outpatient f/u.  5/19- HA somewhat better this AM? Maybe this was contributing?  I spent a total of 39    minutes on total care today- >50% coordination of care- due to  D/w team and OT as well as nursing about pt's HA's- medical issues and treatment        03/29/2024    4:39 AM 03/28/2024    8:23 PM 03/28/2024    1:32 PM  Vitals with BMI  Systolic 120 121 161  Diastolic 82 95 80  Pulse 74 76 81    LOS: 11 days A FACE TO FACE EVALUATION WAS PERFORMED  Julion Gatt 03/29/2024, 8:19 AM

## 2024-03-29 NOTE — Progress Notes (Signed)
 STROKE TEAM PROGRESS NOTE   INTERIM HISTORY/SUBJECTIVE His wife  is at the bedside.  Sleeping comfortably in the bed.  Headache apparently improving.  Vital signs stable.  CBC    Component Value Date/Time   WBC 7.2 03/29/2024 0647   RBC 4.93 03/29/2024 0647   HGB 13.1 03/29/2024 0647   HCT 40.6 03/29/2024 0647   PLT 286 03/29/2024 0647   MCV 82.4 03/29/2024 0647   MCH 26.6 03/29/2024 0647   MCHC 32.3 03/29/2024 0647   RDW 12.7 03/29/2024 0647   LYMPHSABS 1.8 03/19/2024 0740   MONOABS 0.6 03/19/2024 0740   EOSABS 0.2 03/19/2024 0740   BASOSABS 0.0 03/19/2024 0740    BMET    Component Value Date/Time   NA 138 03/29/2024 0647   K 3.5 03/29/2024 0647   CL 105 03/29/2024 0647   CO2 25 03/29/2024 0647   GLUCOSE 136 (H) 03/29/2024 0647   BUN 8 03/29/2024 0647   CREATININE 0.92 03/29/2024 0647   CALCIUM  9.7 03/29/2024 0647   GFRNONAA >60 03/29/2024 0647    IMAGING past 24 hours No results found.    Vitals:   03/28/24 1332 03/28/24 2023 03/29/24 0439 03/29/24 1341  BP: 113/80 (!) 121/95 120/82 113/76  Pulse: 81 76 74 86  Resp: 16 18 18 18   Temp: 97.9 F (36.6 C) 98.2 F (36.8 C) 98.2 F (36.8 C) 98.4 F (36.9 C)  TempSrc: Oral Oral Oral Oral  SpO2: 97% 100% 99% 99%  Weight:      Height:         PHYSICAL EXAM General: well-developed patient in no acute distress CV: Regular rate and rhythm on monitor Respiratory:  Regular, unlabored respirations on room air GI: Abdomen soft and nontender  Neuro patient sleeping comfortably in bed baseline nonverbal and deafness. No gaze palsy, tracking bilaterally, blinking to visual threat bilatearlly, PERRL. No facial droop. Tongue midline. LUE and LLE no drift, RUE and RLE drift  Sensation not cooperative, b/l FTN intact although slightly slow on the right, gait not tested.    ASSESSMENT/PLAN  [name] is a 33 y.o. male with history of hx of deafness, stroke/extensive venous thrombosis on Eliquis  (discharged  02/10/2024), prior history of meningoencephalitis, hyperlipidemia and GERD. CT head with no acute process. 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 CTA and CTV obtained. Heparin  gtt initiated. NIH on Admission 25  Persistent CVST and venous infarct Patient seems more lethargic than before with mild right sided weakness, therapist also confirmed decline in rehab for the last 2-3 days Seems right sided weakness more so in standing or sitting position  Orthostatic vital neg On Na tab, with stable Na 142->140 Overnight EEG - no seizure -> d/c CT and CTA no significant change. However, reviewed with Dr. Del Favia neuroradiologist seems like there was new venous branch thrombosis at serial 5 images 31 and 32.  Repeat MRI reported left frontal evolving venous infarct with gliosis. However on review with Dr. Del Favia neuroradiologist, seems like there was new venous infarct at left frontal rolandic area.  Episode of severe R weakness overnight but improved fast Anemia Hb 13.4--12.2--12.3--10.9--11.6 Iron panel, B12 and FA unremarkable Hypotension BP 90s to 100s, no significant orthostatic component On IVF s/p IV bolus, also put on midodrine  5->10mg  tid Continue PT and OT Now on coumadin  with INR goal 2-3. He did have INR 2.1-2.2 for the last 4 days, today 2.5. Recommend new INR goal 2.5-3.5. discussed with  pharmacy.   Hx of CVST Admitted 02/01/2024 for CVST.  Status post IR for thrombectomy and discharged on Eliquis . EF 45-50%.  Readmitted 03/02/2024 for right-sided weakness.  CT head showed increased left frontal venous infarct.  CTV showed recurrent occlusive and subocclusive thrombi within the SSS posteriorly, extending to the confluence of sinus.  Thrombus extending into the medial aspect of the right transverse sinus.  Clot burden within the right sigmoid sinus has decreased.  Patient was concerning for dehydration at the time, and received fluid  resuscitation.  Discharged in good condition Readmitted 03/08/2024 for left gaze, R weakness and neglect. CT Venogram Mild progression of extensive thrombus in the superior sagittal sinus posteriorly. Mild improvement of subocclusive thrombus in the  transverse and sigmoid sinuses bilaterally. Persistent bilateral cavernous sinus thrombosis. MRI  Evolving subacute venous ischemic changes involving the paramedian frontal lobes and likely the left occipital lobe, not significantly changed as compared to previous.  4/29 Head CT generalized brain swelling.  On personal review, seems more cerebral edema than 4/28. Put on 3% saline, although Na never real high, his symptoms essentially resolved the day after. LDL 58 and A1C 5.7. EEG LTM no seizure. Was initially resumed eliquis  but later changed to coumadin  with INR goal 2-3. Pt discharged to CIR Pt will follow up with stroke clinic Dr. Janett Medin on 05/05/24.   Hx of Stroke and viral meningitis January 27, 2024 right frontal 2-3 punctate infarcts, etiology: Likely due to infectious vasculitis vs. Small vessel disease in the setting of CNS infection  CSF at that time WBC 22, RBC 1330, protein 91, glucose 80, concerning for viral meningitis Completed course of Doxy No future LP recommended given no sign of CNS infection and low-lying cerebellar tonsils extending up to 9 mm below the foreman magnum  HA, possibly positional Per family and therapist, patient headache and functioning much worse on sitting up and standing up, more prominent recently. Had LP 01/2024, not sure if could be have slow CSF leak did not worse to current positional headache. Do not feel that this is laded to CSF leak On amitriptyline  25 D/c fioricet  Decrease depakote  to 750mg  Q8h to 250 mg twice daily to help with the vomiting Check depakote  level 26-> 41 Encourage hydration Added Zanaflex  4 mg at night.  Continue to use Robaxin  as needed  Hypotension Pt BP low recently Orthostatic vitals  no obvious orthostatic hypotension S/p IV bolus Off IVF, encourage po intake On midodrine  5->10mg  tid  Hyperlipidemia Home meds:  Atorvastatin  80  LDL in 01/2024 279 In 02/2024 LDL 58, goal < 70 Continue statin   Other Active Problems Hypokalemia K 3.1 - 3.5 - 3.2 - 4.1 deafness  Hospital day # 11   Patient continues to have new onset daily persistent headache for the last couple of months since onset of this venous sinus thrombosis.  Exact description of the headache and features are difficult to interpret given patient's language and sign  language barrier issues.  Patient does have low-lying cerebellar tonsils but these appear unchanged on the MRI done few days ago compared to the MRI in March hence I do not think it is a new finding of intracranial hypotension hence we will hold off on doing epidural patch.  Recommend continue Zanaflex  4 mg at night for new onset daily persistent headache if needed may consider increasing dose in 1 to 2 weeks.  Continue to use Robaxin  as needed.  Stroke team will sign off.  Follow-up with an outpatient stroke  clinic in 2 months.  Discussed with patient,  and father at the bedside.  Discussed with rehab team. Greater than 50% time during this 35-minute visit was spent on counseling and coordination of care about his persistent headaches and review of MRI findings and discussion with patient and family and answering questions.  Ardella Beaver, MD To contact Stroke Continuity provider, please refer to [email or web address]. After hours, contact General Neurology

## 2024-03-29 NOTE — Progress Notes (Signed)
 Physical Therapy Note  Patient Details  Name: Samuel Warren MRN: [id number] Date of Birth: [birth year] Today's Date: 03/29/2024    Pt presents supine in bed and asleep.  Pt awakens and responds to interpreter of increased pain/HA and some c/o nausea.  Pt refuses any mobility at this time and states has received pain meds.  Pt has been changed to 15/7 2/2 medical condition.   [name] 03/29/2024, 11:39 AM

## 2024-03-29 NOTE — Progress Notes (Signed)
 Occupational Therapy Session Note  Patient Details  Name: Samuel Warren MRN: [id number] Date of Birth: May 01, 1991  Today's Date: 03/29/2024 OT Individual Time: 1300-1330 OT Individual Time Calculation (min): 30 min  and Today's Date: 03/29/2024 OT Missed Time: 45 Minutes;60 min Missed Time Reason: Patient fatigue;Pain   Short Term Goals: Week 2:  OT Short Term Goal 1 (Week 2): Patient will complete LB dressing with mod A OT Short Term Goal 2 (Week 2): Patient will maintain static sitting balance with min A OT Short Term Goal 3 (Week 2): Patient will complete toileting tasks with mod A  Skilled Therapeutic Interventions/Progress Updates:      Therapy Documentation Precautions:  Precautions Precautions: Fall, Other (comment) Recall of Precautions/Restrictions: Intact Precaution/Restrictions Comments: R inattention Restrictions Weight Bearing Restrictions Per Provider Order: No General: Pt supine in bed upon OT arrival, nodding to agree to OOB activity from signing to interpreter. Wife present.   Pain:  10/10 pain reported in head/neck, activity, intermittent rest breaks, distractions provided for pain management, pt signing "throbbing" motion to interpreter that head/neck throbbing seated on EOB. OT laying pt back supine for rest breaks. MD notified.  ADL: OT providing skilled intervention for core strength, static sitting balance and activity tolerance. OT assisting pt with supine><EOB at Max A for 2 trials of sitting EOB. Pt with severe anterior lean with OT using multimodal cuing to correct posture and pt unable to do so. Pt sitting EOB at Max A d/t anterior lean for both trials. Pt also reporting dizziness/nausea with both trials of sitting EOB. Pt returned to bed d/t pain fatigue, nausea, signing "finished".     Pt supine in bed with bed alarm activated, 2 bed rails up, call light within reach and 4Ps assessed. Wife present    Therapy/Group: Individual Therapy  Nila Barth,  OTD, OTR/L 03/29/2024, 3:49 PM

## 2024-03-30 LAB — PROTIME-INR
INR: 3.7 — ABNORMAL HIGH (ref 0.8–1.2)
Prothrombin Time: 36.6 s — ABNORMAL HIGH (ref 11.4–15.2)

## 2024-03-30 MED ORDER — AMITRIPTYLINE HCL 25 MG PO TABS
75.0000 mg | ORAL_TABLET | Freq: Every day | ORAL | Status: DC
  Administered 2024-03-30 – 2024-04-03 (×5): 75 mg via ORAL
  Filled 2024-03-30 (×5): qty 3

## 2024-03-30 NOTE — Progress Notes (Signed)
 Patient ID: Samuel Warren, male   DOB: [birth year], 33 y.o.   MRN: [id number]  Updated brother on pt's team conference goals and need for more assist due to pain and therapy decreased to 15/7 due to his tolerance. Target discharge date 5/28. Await recommendation for equipment and will need family education prior to discharge home next week. Pt tends to fluctuate in his levels due to pain and dizziness. MD is aware and addressing. Continue to work on discharge needs.

## 2024-03-30 NOTE — Progress Notes (Signed)
 PROGRESS NOTE   Subjective/Complaints: Per pt HA 7/10- per therapy, has been refusing a lot of therapy due to HA's.  So sounds worse.   Brother called family member to let me speak to them in english-   I explained he cannot miss therapy- because this issue will be for months, and we only have a couple of weeks to get him better ROS: Limited by deafness and language Objective:   No results found.   Recent Labs    03/28/24 1058 03/29/24 0647  WBC 5.3 7.2  HGB 12.6* 13.1  HCT 40.5 40.6  PLT 260 286   Recent Labs    03/28/24 1058 03/29/24 0647  NA 139 138  K 3.9 3.5  CL 106 105  CO2 22 25  GLUCOSE 123* 136*  BUN 9 8  CREATININE 0.93 0.92  CALCIUM  9.5 9.7     Intake/Output Summary (Last 24 hours) at 03/30/2024 0952 Last data filed at 03/29/2024 1351 Gross per 24 hour  Intake 480 ml  Output --  Net 480 ml           Physical Exam: Vital Signs Blood pressure 101/73, pulse 70, temperature 97.8 F (36.6 C), resp. rate 18, height 5\' 4"  (1.626 m), weight 65.3 kg, SpO2 99%.        General: awake, alert, just woke up- congenital deafness; rates HA 7/10; brother who doesn't speak english at bedside; NAD HENT: conjugate gaze; oropharynx moist CV: regular rate and rhythm; no JVD Pulmonary: CTA B/L; no W/R/R- good air movement GI: soft, NT, ND, (+)BS Psychiatric: appropriate- flat- just woke up Neurological: alert- deaf  MSK- neck tightness  in upper traps, splenius capitus and levators and scalenes- tight again Musculoskeletal:        General: Normal range of motion.     Cervical back: Neck supple. No tenderness.     Comments: Moving all 4 extremities in bed Skin:    General: Skin is warm and dry.     Comments:  IV out of neck is gone- wounds healing  Neurological:     Mental Status: He is alert.     Comments: [name] makes eye contact with examiner.  Exam limited by language barrier.   Follows simple demonstrated commands.  No facial droop. Mild ataxia Mild R inattention Mild ot moderate searching pattern on B/L finger to nose   Neuro exam appears consistent with prior but difficult to test due to communication 5/17  Assessment/Plan: 1. Functional deficits which require 3+ hours per day of interdisciplinary therapy in a comprehensive inpatient rehab setting. Physiatrist is providing close team supervision and 24 hour management of active medical problems listed below. Physiatrist and rehab team continue to assess barriers to discharge/monitor patient progress toward functional and medical goals  Care Tool:  Bathing    Body parts bathed by patient: Chest, Abdomen, Front perineal area, Buttocks, Right upper leg, Left upper leg, Right lower leg, Left lower leg, Face, Right arm, Left arm         Bathing assist Assist Level: Moderate Assistance - Patient 50 - 74%     Upper Body Dressing/Undressing Upper body dressing   What is  the patient wearing?: Pull over shirt    Upper body assist Assist Level: Moderate Assistance - Patient 50 - 74%    Lower Body Dressing/Undressing Lower body dressing      What is the patient wearing?: Pants     Lower body assist Assist for lower body dressing: Maximal Assistance - Patient 25 - 49%     Toileting Toileting    Toileting assist Assist for toileting: Maximal Assistance - Patient 25 - 49%     Transfers Chair/bed transfer  Transfers assist     Chair/bed transfer assist level: Moderate Assistance - Patient 50 - 74%     Locomotion Ambulation   Ambulation assist      Assist level: Moderate Assistance - Patient 50 - 74% Assistive device: Hand held assist Max distance: 60   Walk 10 feet activity   Assist     Assist level: Moderate Assistance - Patient - 50 - 74% Assistive device: Hand held assist   Walk 50 feet activity   Assist Walk 50 feet with 2 turns activity did not occur: Safety/medical  concerns  Assist level: Moderate Assistance - Patient - 50 - 74% Assistive device: Hand held assist    Walk 150 feet activity   Assist Walk 150 feet activity did not occur: Safety/medical concerns         Walk 10 feet on uneven surface  activity   Assist Walk 10 feet on uneven surfaces activity did not occur: Safety/medical concerns         Wheelchair     Assist Is the patient using a wheelchair?: Yes Type of Wheelchair: Manual    Wheelchair assist level: Minimal Assistance - Patient > 75% Max wheelchair distance: 25    Wheelchair 50 feet with 2 turns activity    Assist        Assist Level: Moderate Assistance - Patient 50 - 74%   Wheelchair 150 feet activity     Assist      Assist Level: Maximal Assistance - Patient 25 - 49%   Blood pressure 101/73, pulse 70, temperature 97.8 F (36.6 C), resp. rate 18, height 5\' 4"  (1.626 m), weight 65.3 kg, SpO2 99%.  Medical Problem List and Plan: 1. 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   Team conference to check on progress 2.  Antithrombotics: -DVT/anticoagulation:  Pharmaceutical: Coumadin  5/9- Last INR 2.3- managed by pharmacy 5/13- INR 2.1 5/15- INR 2.5 5/16- new goal INR 2.5 to 3.5 since had new stroke per Neuro 5/19- INR 3.0 this AM 5/20- INR a little high at 3.7             -antiplatelet therapy: N/A 3. Pain Management: Fioricet  as needed for refractory headaches, Tylenol  as needed  5/9- having HA's- they attempted Topamax - might need to try Elavil - if doesn't improve by tomorrow, will add Elavil  25 mg at bedtime  5/10- started Elavil  25 mg at bedtime- and lidoderm  patches- 2 patches 8pm to 8am-  increased Fioricet  to q4 hours prn- cannot have Topamax  due to low Bicarb levels  5/11- HA somewhat better this AM per son by phone- pt continues to point at 10/10- but muscles in neck much looser  5/13- HA 5/10 this AM- might benefit from TrP Injections but hard to assess consent?  5/14- will d/w family and pt tomorrow  5/15- pt's Brother- told me had severe HA yesterday- Fioricet  taken off by Neurology but they added/increased Depakote  for HA control and possible seizures coverage-   5/16- increased Elavil  to 50 mg at bedtime for HA's- has tylenol  as needed otherwise- cannot do Trp injections since INR is going too high  5/18 discussed with neurology, do not suspect related to intracranial hypotension-no plan for epidural patch at this time.  Will start Zanaflex .  5/19- HA down to 5/10 this AM- if INR isn't higher, might try just upper trap Trp injections tomorrow? 4. Mood/Behavior/Sleep: Provide emotional support             -antipsychotic agents: N/A 5. Neuropsych/cognition: This patient is? capable of making decisions on his own behalf. 6. Skin/Wound Care: Routine skin checks 7. Fluids/Electrolytes/Nutrition: Routine in and outs with follow-up chemistries 8.  Cerebral edema.  Hypertonic saline discontinued 9.  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  Status post IR for thrombectomy was discharged on Eliquis - now changed to Coumadin  10.  History of stroke/recent viral meningitis.  Completed course of Doxy 11.  Refractory headaches.  Medrol  Dosepak.  Topamax  discontinued due to low bicarb- might need to start Elavil , since HA's still 6/10  5/9- still having constant HA's  5/10- pain up to 10/10 today- got head CT due to 10/10 HA- looks OK- no changes- reexamined pt's neck- very tight musculature- will order Robaxin  750 mg q6 hours prn for neck/posterior HA's- as well as lidocaine  patches nightly- added Elavil  25 mg at bedtime for prevention- and asked therapy to do myofascial release for  pt.   5/11- muscles looser- HA appears to be less- appears more comfortable  5/12 not using robaxin  frequently, will schedule AM dose. family indicates headaches/neck pain overall improving  5/13- HA down to 5/10 this AM  5/16- Fioricet  stopped by Neuro- due to risk of rebound HA's- added Elavil  and then increased to 50 mg at bedtime today.   -cannot do Trp Injections since his INR is going to be 2.5 to 3.5- and that's too high for me to feel comfortable doing trp injections  5/17 neurology following, decreased depakote , encouraged hydration   5/18 discussed with neurology, Zanaflex  was started, consider botox injections for HA  5/20- Per therapy, HA's getting worse and participating with therapy less- will increase Elavil  to 75 mg at bedtime- and verify using lidoderm  patches. Cannot do B blocker or Ca channel blocker due to low BP. Cannot do Trp injections due to INR 3.7 12.  Hyperlipidemia.  Lipitor  13.  Congenital Deafness.  Patient does use some sign language- but mainly gestures per ASL interpretor 14.  GERD.  Protonix   15. Abd pain/Constipation  5/9- pt clear having constant abd pain- will get CT of abd/pelvis with contrast to assess abd pain   5/10- Abd CT looks good  -LBM 3 days ago- ordered Sorbitol  for constipation  5/11- LBM yesterday  5/12 LBM documented yesterday  5/13- LBM 2 days ago- if no BM by tomorrow, will intervene  5/15- LBM yesterday-so abd pain is better  5/16- LBM 2 days ago  5/17 Will add sorbitol  30ml  5/18 sorbitol  60ml  5/19- LBM yesterday after sorbitol  16. Anemia. Mild. -5/12 HGB  12.3, stable continue to monitor  17. Code stroke  5/14- Was (-)- but being assessed for seizures.  5/15- still has mild R sided weakness-  EEG showed cortical dysfunction on L side- but no seizures seen- but MRI looked stable- will wait for Neuro note today- had another episode yesterday it appears based on nursing notes, that was weaker standing/sitting than laying down-it appears  that this occurs almost every time up with therapy- much weaker than when laying down. 5/16- Neuro spoke with radiology and they said he did actually have a new small stroke that would explain weakness of R side   5/17 neuro following, appears Dr. Janett Medin may see him today 5/20- Strength still weaker on R side 18. Orthostatic hypotension  5/15- On Midodrine  5 mg TID and on IVF's- 90s systolic- laying down on chart- he's not dry based on BUN/Cr ratio- Off IVF's but if BP stays low, will restart IVF's.  - will increase Midodrine  to 7.5 mg TID  5/16- will d/w therapy and see how it's doing for him to see if need to increase midodrine  again.   5/18 BP stable, monitor how he does with therapy tomorrow  5/19- changed to 15/7  5/20- BP still low 100's systolic- if drops again, will need to maximize Midodrine  at 10 mg TID.  19. milky discharge from his L ear and some clear discharge on his ear noted by nursing. Unable to see TM but does have thick milky looking discharge L ear.  Will start augmentin  and ofloxacin  drops for L ear. May be affecting balance, consider ENT outpatient f/u.  5/19- HA somewhat better this AM? Maybe this was contributing?  5/20- per staff, HA is worse- per pt, it's better- it's probably better when he wakes up, but gets worse when attempts to get up- has been refusing therapy    I spent a total of  53  minutes on total care today- >50% coordination of care- due to  D/w pt via ASL as well as called family to discuss that cannot do injections due elevated INR- it's just too high to feel comfortable doing it- Also explained increasing Elavil  to help prevent HA's, but explained it will take WEEKS to months to get better control of HA's but needs to do therapy no matter what- cannot refuse of we cannot keep him on rehab. Also d/w nursing    LOS: 12 days A FACE TO FACE EVALUATION WAS PERFORMED  Agatha Duplechain 03/30/2024, 9:52 AM

## 2024-03-30 NOTE — Progress Notes (Signed)
 Occupational Therapy Session Note  Patient Details  Name: Samuel Warren MRN: [id number] Date of Birth: [birth year]  Today's Date: 03/30/2024 OT Individual Time: 0910-1015 OT Individual Time Calculation (min): 65 min    Short Term Goals: Week 1:  OT Short Term Goal 1 (Week 1): Patient will complete LB dressing with min A OT Short Term Goal 1 - Progress (Week 1): Revised due to lack of progress OT Short Term Goal 2 (Week 1): Patient will complete toileting tasks with min A OT Short Term Goal 2 - Progress (Week 1): Revised due to lack of progress OT Short Term Goal 3 (Week 1): Patient will maintain dynamic standing balance iwth min A OT Short Term Goal 3 - Progress (Week 1): Revised due to lack of progress  Skilled Therapeutic Interventions/Progress Updates:      Therapy Documentation Precautions:  Precautions Precautions: Fall, Other (comment) Recall of Precautions/Restrictions: Intact Precaution/Restrictions Comments: R inattention Restrictions Weight Bearing Restrictions Per Provider Order: No General: Pt supine in bed upon OT arrival, nodding for participation in OT session. Brother and interpreter present. Pt received meds during session. Pt with increased alertness during session for entire session.  Pain:  unrated pain reported in head and neck, positioning, activity, intermittent rest breaks, distractions provided for pain management, pt reports tolerable to proceed.    ADL: OT providing skilled intervention on ADL retraining in order to increase independence with tasks and increase activity tolerance. Pt completed the following tasks at the current level of assist: Bed mobility: Mod A supine>EOB with Max A for trunk support while seated EOB d/t Rt anterior lean Grooming/oral hygiene: SBA supine in bed to wash face UB dressing:  Max A seated at EOB, OT providing assistance with trunk support and donning shirt on arms and overhead LB dressing: Max A at bed level, rolling Rt  and Lt to manage over hips, attempted bridging in bed although unable to complete clearance of buttocks off of bed to manage pants over waist Footwear: total A to don TEDs and grip socks Transfers: Mod-Max A +1, stand step transfer once with RW and once without AD, tactile cueing for upright posture during mobility   Other Treatments: OT provided TIS W/C for increased head/neck support for pain management and to encourage OOB activity. OT adjusted head rest of W/C for increased comfort and positioning. OT donned Rt side lap tray for cue for upright positioning and shoulder support on Rt side d/t increased weakness.    Pt seated in W/C at end of session with W/C alarm donned, call light within reach and 4Ps assessed. Brother present, verbal handoff to nsg for safety.    Therapy/Group: Individual Therapy  Nila Barth, OTD, OTR/L 03/30/2024, 12:36 PM

## 2024-03-30 NOTE — Progress Notes (Addendum)
 Physical Therapy Session Note  Patient Details  Name: Samuel Warren MRN: [id number] Date of Birth: 09-Apr-1991  Today's Date: 03/31/2024 PT Individual Time:  -      Short Term Goals: Week 2:  PT Short Term Goal 1 (Week 2): Pt will transfer sup to sit w/ min A consistently PT Short Term Goal 2 (Week 2): Pt will improve sitting balance w/ min A PT Short Term Goal 3 (Week 2): Pt will transfer sit to stand w/ min A consistently. PT Short Term Goal 4 (Week 2): Pt will amb w/ mod A w/ LRAD x 50'  Skilled Therapeutic Interventions/Progress Updates: Pt presents supine in bed w/ improved demeanor today from last session w/ this PT.  Pt agreeable to therapy.  Pt transfers sup to sit w/ max/mod A and use of siderails.  Pt lists to R once seated and begins flexing trunk/neck.  Pt unable to maintain upright posture w/o mod A.  Pt transfers sit to stand w/ max A and amb to TIS, although flexed posture unless cued.  Pt needs constant cues for scooting back into w/c.  Pt wheeled to small gym and then amb w/ HHA + 2 to Nu-step.  Pt set-up for paced program at 40 rpm x 5' on Level 1 w/ constant cues through interpreter for visual scanning to determine lean to right and for maintaining LES in neutral position.  Pt amb to TIS w/ A + 2 HHA and cues for upright posture.  Pt performed standing toe taps to 3 3/4" platform, but unable to perform w/o A + 2 holding up as pt retropulsing.  Pt performed sit to stand w/ PT in front and hands on shoulders, w/ continued retropulsion but does improve w/ increased time.  Pt amb w/ RW and max A w/ continued flexed posture and cues for same and for step length w/ min improvement but no carryover.  Pt then required HHA to complete trial of gait to w/c.  Pt c/o increasing pain to head and neck.  Pt returned to room and performed step-pivot w/c > bed w/ max A and facilitation for weight shift.  Pt then required min A for LES into bed and scoot to center.  Bed alarm on and all needs in  reach.     Therapy Documentation Precautions:  Precautions Precautions: Fall, Other (comment) Recall of Precautions/Restrictions: Intact Precaution/Restrictions Comments: R inattention Restrictions Weight Bearing Restrictions Per Provider Order: No General:   Vital Signs: Therapy Vitals Temp: 98 F (36.7 C) Temp Source: Oral Pulse Rate: 66 Resp: 18 BP: 103/71 Patient Position (if appropriate): Lying Oxygen Therapy SpO2: 99 % O2 Device: Room Air Pain:c/o pin to head and neck, but does not demo pain initially, but increases w/ activity and more motioning as session progresses. Pain Assessment Pain Scale: Faces Faces Pain Scale: Hurts a little bit Pain Location: Head Pain Intervention(s): Medication (See eMAR) PAINAD (Pain Assessment in Advanced Dementia) Breathing: normal Negative Vocalization: none Facial Expression: smiling or inexpressive Body Language: relaxed Consolability: no need to console PAINAD Score: 0     Therapy/Group: Individual Therapy  Demeisha Geraghty P Meta Kroenke 03/31/2024, 2:00 PM

## 2024-03-30 NOTE — Progress Notes (Signed)
 PHARMACY - ANTICOAGULATION CONSULT NOTE  Pharmacy Consult for Warfarin Indication:  central venous sinus thrombosis  No Known Allergies  Patient Measurements: Height: 5\' 4"  (162.6 cm) Weight: 65.3 kg (143 lb 15.4 oz) IBW/kg (Calculated) : 59.2 HEPARIN  DW (KG): 65.3  Vital Signs: Temp: 97.8 F (36.6 C) (05/20 0637) Temp Source: Oral (05/19 2012) BP: 101/73 (05/20 0637) Pulse Rate: 70 (05/20 0637)  Labs: Recent Labs    03/28/24 1058 03/29/24 0647 03/30/24 0535  HGB 12.6* 13.1  --   HCT 40.5 40.6  --   PLT 260 286  --   LABPROT 27.4* 31.0* 36.6*  INR 2.5* 3.0* 3.7*  CREATININE 0.93 0.92  --     Estimated Creatinine Clearance: 94.7 mL/min (by C-G formula based on SCr of 0.92 mg/dL).  Assessment: 34 YOM with history of central venous sinus thrombosis on Eliquis  PTA. He returned with new onset of R-sided weakness and cerebral edema.  Patient was initially on IV heparin , then transitioned back to PTA Eliquis .  Pharmacy consulted to transition patient to IV heparin  bridge to Warfarin starting 5/3. IV heparin  stopped on 5/6 with therapeutic INR and currently on Warfarin only.   New INR goal of 2.5 to 3.5  5/20 AM: INR now supratherapeutic at 3.7 up from 3.0. Likely reflective of 5 mg doses 5/16-5/18. Daily dosing regimen likely closer to ~3 mg daily. Based on chart documentation patient's PO intake has declined over past couple of days, likely also contributing to rapid rise in INR. However Rn reports that patient does not like hospital food and has been having family bringing in meals from home- of which he has been ea Ing 100%.    Goal of Therapy:  INR goal 2.5 to 3.5 Monitor platelets by anticoagulation protocol: Yes   Plan:  Hold Warfarin today, anticipate re-dosing tomorrow or day after at a lower dose.  Daily PT/INR Monitor for signs/symptoms of bleeding.  Chrystie Crass, PharmD Clinical Pharmacist  03/30/2024 7:57 AM

## 2024-03-31 LAB — PROTIME-INR
INR: 3.2 — ABNORMAL HIGH (ref 0.8–1.2)
Prothrombin Time: 33.1 s — ABNORMAL HIGH (ref 11.4–15.2)

## 2024-03-31 MED ORDER — WARFARIN SODIUM 2 MG PO TABS
2.0000 mg | ORAL_TABLET | Freq: Once | ORAL | Status: AC
  Administered 2024-03-31: 2 mg via ORAL
  Filled 2024-03-31: qty 1

## 2024-03-31 NOTE — Progress Notes (Signed)
 PHARMACY - ANTICOAGULATION CONSULT NOTE  Pharmacy Consult for Warfarin Indication:  central venous sinus thrombosis  No Known Allergies  Patient Measurements: Height: 5\' 4"  (162.6 cm) Weight: 65.3 kg (143 lb 15.4 oz) IBW/kg (Calculated) : 59.2 HEPARIN  DW (KG): 65.3  Vital Signs: Temp: 98.2 F (36.8 C) (05/21 0451) BP: 104/78 (05/21 0451) Pulse Rate: 72 (05/21 0451)  Labs: Recent Labs    03/28/24 1058 03/29/24 0647 03/30/24 0535 03/31/24 0457  HGB 12.6* 13.1  --   --   HCT 40.5 40.6  --   --   PLT 260 286  --   --   LABPROT 27.4* 31.0* 36.6* 33.1*  INR 2.5* 3.0* 3.7* 3.2*  CREATININE 0.93 0.92  --   --     Estimated Creatinine Clearance: 94.7 mL/min (by C-G formula based on SCr of 0.92 mg/dL).  Assessment: 59 YOM with history of central venous sinus thrombosis on Eliquis  PTA. He returned with new onset of R-sided weakness and cerebral edema.  Patient was initially on IV heparin , then transitioned back to PTA Eliquis .  Pharmacy consulted to transition patient to IV heparin  bridge to Warfarin starting 5/3. IV heparin  stopped on 5/6 with therapeutic INR and currently on Warfarin only.   New INR goal of 2.5 to 3.5  5/21 AM: INR now therapeutic at 3.2 following a held dose yesterday.  Daily dosing regimen likely closer to ~3 mg daily. Based on chart documentation patient's PO intake has declined over past couple of days, likely also contributing to rapid rise in INR. However Rn reports that patient does not like hospital food and has been having family bringing in meals from home- of which he has been ea Ing 100%.    Goal of Therapy:  INR goal 2.5 to 3.5 Monitor platelets by anticoagulation protocol: Yes   Plan:  Warfarin 2 mg today Daily PT/INR Monitor for signs/symptoms of bleeding.  Chrystie Crass, PharmD Clinical Pharmacist  03/31/2024 8:16 AM

## 2024-03-31 NOTE — Plan of Care (Signed)
  Problem: Consults Goal: RH STROKE PATIENT EDUCATION Description: See Patient Education module for education specifics  Outcome: Progressing   Problem: RH BOWEL ELIMINATION Goal: RH STG MANAGE BOWEL WITH ASSISTANCE Description: STG Manage Bowel with  minimal Assistance. Outcome: Progressing   Problem: RH BLADDER ELIMINATION Goal: RH STG MANAGE BLADDER WITH ASSISTANCE Description: STG Manage Bladder With  minimal Assistance Outcome: Progressing   Problem: RH SKIN INTEGRITY Goal: RH STG SKIN FREE OF INFECTION/BREAKDOWN Description: Manage skin free of infection/ breakdown with minimal assistance Outcome: Progressing   Problem: RH SAFETY Goal: RH STG ADHERE TO SAFETY PRECAUTIONS W/ASSISTANCE/DEVICE Description: STG Adhere to Safety Precautions With minimal  Assistance/Device. Outcome: Progressing   Problem: RH KNOWLEDGE DEFICIT Goal: RH STG INCREASE KNOWLEDGE OF STROKE PROPHYLAXIS Description: Manage increase knowledge of stroke prohylaxis with minimal assistance from spouse and family members using educational materials provided Outcome: Progressing   Problem: RH KNOWLEDGE DEFICIT Goal: RH STG INCREASE KNOWLEGDE OF HYPERLIPIDEMIA Description: Manage increase knowledge of hyperlipidemia with minimal assistance from spouse and family members using educational materials provided Outcome: Progressing

## 2024-03-31 NOTE — Progress Notes (Signed)
 Occupational Therapy Session Note  Patient Details  Name: Samuel Warren MRN: [id number] Date of Birth: 1991/07/29  Today's Date: 03/31/2024 OT Individual Time: 1300-1415 OT Individual Time Calculation (min): 75 min    Short Term Goals: Week 1:  OT Short Term Goal 1 (Week 1): Patient will complete LB dressing with min A OT Short Term Goal 1 - Progress (Week 1): Revised due to lack of progress OT Short Term Goal 2 (Week 1): Patient will complete toileting tasks with min A OT Short Term Goal 2 - Progress (Week 1): Revised due to lack of progress OT Short Term Goal 3 (Week 1): Patient will maintain dynamic standing balance iwth min A OT Short Term Goal 3 - Progress (Week 1): Revised due to lack of progress  Skilled Therapeutic Interventions/Progress Updates:      Therapy Documentation Precautions:  Precautions Precautions: Fall, Other (comment) Recall of Precautions/Restrictions: Intact Precaution/Restrictions Comments: R inattention Restrictions Weight Bearing Restrictions Per Provider Order: No General: Pt supine in bed upon OT arrival, waving hi and agreeable to OT session. Wife present. ASL interpreter present. Pt with increased arousal and alertness this session.  Pain: slight HA pain reported. OT providing education on positioning and OT applied KT tape to cervical region of posterior neck and Lt trapezius in order to provide stability and decrease pain with use. Pt educated on risks and benefits of tape.  ADL: OT providing skilled intervention on ADL retraining in order to increase independence with tasks and increase activity tolerance. Pt completed the following tasks at the current level of assist: Bed mobility: Mod A for supine><EOB using Min A for stability seated at EOB Transfers: Mod A without AD for stand step transfer with severe anterior lean and multimodal cueing to correct postural lean EOB><W/C  Balance: OT providing skilled intervention with pt completing a  variety of dynamic sitting activities in order to promote increased balance strategies with ADL participation. Pt completed all activities at seated level with intermittent supported/unsupported sitting. Pt completed retrieving squigz from mirror, able to anterior lean with Min A for trunk support using Rt arm to retrieve from mirror, using mirror as visual cue for posture.  Exercises: OT the following exercise circuit in order to improve functional activity, strength and endurance to prepare for ADLs such as bathing. Pt completed the following exercises in seated position with no noted LOB/SOB and 3x10 repetitions on each exercise: -forward punches with green theraband -seated resistive marches with green theraband -glute abduction with green theraband -resistive knee flexion -resistive knee extension   Pt supine in bed with bed alarm activated, 2 bed rails up, call light within reach and 4Ps assessed.   Therapy/Group: Individual Therapy  Nila Barth, OTD, OTR/L 03/31/2024, 4:10 PM

## 2024-03-31 NOTE — Progress Notes (Signed)
 PROGRESS NOTE   Subjective/Complaints:  Pt reports no HA this AM- got Tylenol  at 6am and last night.   Also denies nausea.    ROS: Limited by deafness and cognition Objective:   No results found.   Recent Labs    03/28/24 1058 03/29/24 0647  WBC 5.3 7.2  HGB 12.6* 13.1  HCT 40.5 40.6  PLT 260 286   Recent Labs    03/28/24 1058 03/29/24 0647  NA 139 138  K 3.9 3.5  CL 106 105  CO2 22 25  GLUCOSE 123* 136*  BUN 9 8  CREATININE 0.93 0.92  CALCIUM  9.5 9.7     Intake/Output Summary (Last 24 hours) at 03/31/2024 0740 Last data filed at 03/31/2024 0615 Gross per 24 hour  Intake 832 ml  Output 1000 ml  Net -168 ml           Physical Exam: Vital Signs Blood pressure 104/78, pulse 72, temperature 98.2 F (36.8 C), resp. rate 16, height 5\' 4"  (1.626 m), weight 65.3 kg, SpO2 98%.         General: sleepy but woke for me; supine- family asleep at bedside;; NAD HENT: conjugate gaze; oropharynx moist CV: regular rate and rhythm; no JVD Pulmonary: CTA B/L; no W/R/R- good air movement GI: soft, NT, ND, (+)BS Psychiatric: appropriate Neurological: sleepy- deaf  MSK- neck tightness  in upper traps, splenius capitus and levators and scalenes- tight again Musculoskeletal:        General: Normal range of motion.     Cervical back: Neck supple. No tenderness.     Comments: Moving all 4 extremities in bed Skin:    General: Skin is warm and dry.     Comments:  IV out of neck is gone- wounds healing  Neurological:     Mental Status: He is alert.     Comments: [name] makes eye contact with examiner.  Exam limited by language barrier.  Follows simple demonstrated commands.  No facial droop. Mild ataxia Mild R inattention Mild ot moderate searching pattern on B/L finger to nose   Neuro exam appears consistent with prior but difficult to test due to communication 5/17  Assessment/Plan: 1.  Functional deficits which require 3+ hours per day of interdisciplinary therapy in a comprehensive inpatient rehab setting. Physiatrist is providing close team supervision and 24 hour management of active medical problems listed below. Physiatrist and rehab team continue to assess barriers to discharge/monitor patient progress toward functional and medical goals  Care Tool:  Bathing    Body parts bathed by patient: Chest, Abdomen, Front perineal area, Buttocks, Right upper leg, Left upper leg, Right lower leg, Left lower leg, Face, Right arm, Left arm         Bathing assist Assist Level: Moderate Assistance - Patient 50 - 74%     Upper Body Dressing/Undressing Upper body dressing   What is the patient wearing?: Pull over shirt    Upper body assist Assist Level: Moderate Assistance - Patient 50 - 74%    Lower Body Dressing/Undressing Lower body dressing      What is the patient wearing?: Pants     Lower body assist  Assist for lower body dressing: Maximal Assistance - Patient 25 - 49%     Toileting Toileting    Toileting assist Assist for toileting: Maximal Assistance - Patient 25 - 49%     Transfers Chair/bed transfer  Transfers assist     Chair/bed transfer assist level: Maximal Assistance - Patient 25 - 49%     Locomotion Ambulation   Ambulation assist      Assist level: Maximal Assistance - Patient 25 - 49% Assistive device: Hand held assist Max distance: 25   Walk 10 feet activity   Assist     Assist level: Maximal Assistance - Patient 25 - 49% Assistive device: Hand held assist   Walk 50 feet activity   Assist Walk 50 feet with 2 turns activity did not occur: Safety/medical concerns  Assist level: Moderate Assistance - Patient - 50 - 74% Assistive device: Hand held assist    Walk 150 feet activity   Assist Walk 150 feet activity did not occur: Safety/medical concerns         Walk 10 feet on uneven surface  activity   Assist  Walk 10 feet on uneven surfaces activity did not occur: Safety/medical concerns         Wheelchair     Assist Is the patient using a wheelchair?: Yes Type of Wheelchair: Manual    Wheelchair assist level: Minimal Assistance - Patient > 75% Max wheelchair distance: 25    Wheelchair 50 feet with 2 turns activity    Assist        Assist Level: Moderate Assistance - Patient 50 - 74%   Wheelchair 150 feet activity     Assist      Assist Level: Maximal Assistance - Patient 25 - 49%   Blood pressure 104/78, pulse 72, temperature 98.2 F (36.8 C), resp. rate 16, height 5\' 4"  (1.626 m), weight 65.3 kg, SpO2 98%.  Medical Problem List and Plan: 1. 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  Antithrombotics: -DVT/anticoagulation:  Pharmaceutical: Coumadin  5/9- Last INR 2.3- managed by pharmacy 5/13- INR 2.1 5/15- INR 2.5 5/16- new goal INR 2.5 to 3.5 since had new stroke per Neuro 5/19- INR 3.0 this AM 5/20- INR a little high at 3.7 5/21- INR 3.2             -antiplatelet therapy: N/A 3. Pain Management: Fioricet  as needed for refractory headaches, Tylenol  as needed  5/9- having HA's- they attempted Topamax - might need to try Elavil - if doesn't improve by tomorrow, will add Elavil  25 mg at bedtime  5/10- started Elavil  25 mg at bedtime- and lidoderm  patches- 2 patches 8pm to 8am- increased Fioricet  to q4 hours prn- cannot have Topamax  due to low Bicarb levels  5/11- HA somewhat better this AM per son by phone- pt continues to point at 10/10- but muscles in neck much looser  5/13- HA 5/10 this AM- might benefit from TrP Injections but hard to assess  consent?  5/14- will d/w family and pt tomorrow  5/15- pt's Brother- told me  had severe HA yesterday- Fioricet  taken off by Neurology but they added/increased Depakote  for HA control and possible seizures coverage-   5/16- increased Elavil  to 50 mg at bedtime for HA's- has tylenol  as needed otherwise- cannot do Trp injections since INR is going too high  5/18 discussed with neurology, do not suspect related to intracranial hypotension-no plan for epidural patch at this time.  Will start Zanaflex .  5/19- HA down to 5/10 this AM- if INR isn't higher, might try just upper trap Trp injections tomorrow?  5/21- INR 3.2 today-  4. Mood/Behavior/Sleep: Provide emotional support             -antipsychotic agents: N/A 5. Neuropsych/cognition: This patient is? capable of making decisions on his own behalf. 6. Skin/Wound Care: Routine skin checks 7. Fluids/Electrolytes/Nutrition: Routine in and outs with follow-up chemistries 8.  Cerebral edema.  Hypertonic saline discontinued 9.  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  Status post IR for thrombectomy was discharged on Eliquis - now changed to Coumadin  10.  History of stroke/recent viral meningitis.  Completed course of Doxy 11.  Refractory headaches.  Medrol  Dosepak.  Topamax  discontinued due to low bicarb- might need to start Elavil , since HA's still 6/10  5/9- still having constant HA's  5/10- pain up to 10/10 today- got head CT due to 10/10 HA- looks OK- no changes- reexamined pt's neck- very tight musculature- will order Robaxin  750 mg q6 hours prn for neck/posterior HA's- as well as lidocaine  patches nightly- added Elavil  25 mg at bedtime for prevention- and asked therapy to do myofascial release for pt.   5/11- muscles looser- HA appears to be less- appears more comfortable  5/12 not using robaxin  frequently, will schedule AM dose. family indicates headaches/neck pain overall improving  5/13- HA down to 5/10 this AM  5/16- Fioricet  stopped by  Neuro- due to risk of rebound HA's- added Elavil  and then increased to 50 mg at bedtime today.   -cannot do Trp Injections since his INR is going to be 2.5 to 3.5- and that's too high for me to feel comfortable doing trp injections  5/17 neurology following, decreased depakote , encouraged hydration   5/18 discussed with neurology, Zanaflex  was started, consider botox injections for HA  5/20- Per therapy, HA's getting worse and participating with therapy less- will increase Elavil  to 75 mg at bedtime- and verify using lidoderm  patches. Cannot do B blocker or Ca channel blocker due to low BP. Cannot do Trp injections due to INR 3.7  5/21- pt denies HA this AM 12.  Hyperlipidemia.  Lipitor  13.  Congenital Deafness.  Patient does use some sign language- but mainly gestures per ASL interpretor 14.  GERD.  Protonix   15. Abd pain/Constipation  5/9- pt clear having constant abd pain- will get CT of abd/pelvis with contrast to assess abd pain   5/10- Abd CT looks good  -LBM 3 days ago- ordered Sorbitol  for constipation  5/11- LBM yesterday  5/12 LBM documented yesterday  5/13- LBM 2 days ago- if no BM by tomorrow, will intervene  5/15- LBM yesterday-so abd pain is better  5/21- LBM yesterday 16. Anemia. Mild. -5/12 HGB 12.3, stable continue to monitor  17. Code stroke  5/14- Was (-)- but being assessed for seizures.  5/15- still has mild R sided weakness-  EEG showed cortical dysfunction on L side- but no seizures seen- but MRI looked stable- will wait for Neuro note today- had another episode yesterday it appears based on nursing  notes, that was weaker standing/sitting than laying down-it appears that this occurs almost every time up with therapy- much weaker than when laying down. 5/16- Neuro spoke with radiology and they said he did actually have a new small stroke that would explain weakness of R side   5/17 neuro following, appears Dr. Janett Medin may see him today 5/20-5/21 Strength still weaker on R  side 18. Orthostatic hypotension  5/15- On Midodrine  5 mg TID and on IVF's- 90s systolic- laying down on chart- he's not dry based on BUN/Cr ratio- Off IVF's but if BP stays low, will restart IVF's.  - will increase Midodrine  to 7.5 mg TID  5/16- will d/w therapy and see how it's doing for him to see if need to increase midodrine  again.   5/18 BP stable, monitor how he does with therapy tomorrow  5/19- changed to 15/7  5/20- BP still low 100's systolic- if drops again, will need to maximize Midodrine  at 10 mg TID.   5/21- BP 104 systolic this AM, but was 120s last night. Con't regimen for now 19. milky discharge from his L ear and some clear discharge on his ear noted by nursing. Unable to see TM but does have thick milky looking discharge L ear.  Will start augmentin  and ofloxacin  drops for L ear. May be affecting balance, consider ENT outpatient f/u.  5/19- HA somewhat better this AM? Maybe this was contributing?  5/20- per staff, HA is worse- per pt, it's better- it's probably better when he wakes up, but gets worse when attempts to get up- has been refusing therapy  5/21- pt denied any HA this AM- or nausea- but woke him up to ask- was sleepy. Was c/o ear pain.  If ear pain doesn't improve, will call ENT  I spent a total of  35  minutes on total care today- >50% coordination of care- due to  D/w nursing about care- and then back to d/w nursing after seen pt    LOS: 13 days A FACE TO FACE EVALUATION WAS PERFORMED  Samuel Warren 03/31/2024, 7:40 AM

## 2024-03-31 NOTE — Plan of Care (Signed)
  Problem: Consults Goal: RH STROKE PATIENT EDUCATION Description: See Patient Education module for education specifics  Outcome: Progressing   Problem: RH BOWEL ELIMINATION Goal: RH STG MANAGE BOWEL WITH ASSISTANCE Description: STG Manage Bowel with  minimal Assistance. Outcome: Progressing   Problem: RH BLADDER ELIMINATION Goal: RH STG MANAGE BLADDER WITH ASSISTANCE Description: STG Manage Bladder With  minimal Assistance Outcome: Progressing   Problem: RH SKIN INTEGRITY Goal: RH STG SKIN FREE OF INFECTION/BREAKDOWN Description: Manage skin free of infection/ breakdown with minimal assistance Outcome: Progressing   Problem: RH SAFETY Goal: RH STG ADHERE TO SAFETY PRECAUTIONS W/ASSISTANCE/DEVICE Description: STG Adhere to Safety Precautions With minimal  Assistance/Device. Outcome: Progressing   Problem: RH COGNITION-NURSING Goal: RH STG USES MEMORY AIDS/STRATEGIES W/ASSIST TO PROBLEM SOLVE Description: STG Uses Memory Aids/Strategies With minimal Assistance to Problem Solve. Outcome: Progressing   Problem: RH PAIN MANAGEMENT Goal: RH STG PAIN MANAGED AT OR BELOW PT'S PAIN GOAL Description: <4 w/ prns Outcome: Progressing   Problem: RH KNOWLEDGE DEFICIT Goal: RH STG INCREASE KNOWLEGDE OF HYPERLIPIDEMIA Description: Manage increase knowledge of hyperlipidemia with minimal assistance from spouse and family members using educational materials provided Outcome: Progressing Goal: RH STG INCREASE KNOWLEDGE OF STROKE PROPHYLAXIS Description: Manage increase knowledge of stroke prohylaxis with minimal assistance from spouse and family members using educational materials provided Outcome: Progressing

## 2024-03-31 NOTE — Patient Care Conference (Signed)
 Inpatient RehabilitationTeam Conference and Plan of Care Update Date: 03/30/2024   Time: 1140 am    Patient Name: Samuel Warren      Medical Record Number: [id number]  Date of Birth: Mar 18, 1991 Sex: Male         Room/Bed: 4M13C/4M13C-01 Payor Info: Payor: Advertising copywriter / Plan: [employer] OTHER / Product Type: *No Product type* /    Admit Date/Time:  03/18/2024  6:21 PM  Primary Diagnosis:  Dural venous sinus thrombosis  Hospital Problems: Principal Problem:   Dural venous sinus thrombosis Active Problems:   Orthostatic hypotension   Constipation    Expected Discharge Date: Expected Discharge Date: 04/07/24  Team Members Present: Physician leading conference: Dr. [name] Social Worker Present: Adrianna Albee, LCSW Nurse Present: Jerene Monks, RN PT Present: Aundria Leech, PT OT Present: Nila Barth, OT PPS Coordinator present : Jestine Moron, SLP     Current Status/Progress Goal Weekly Team Focus  Bowel/Bladder   continent with accients  - no accidents   bathroom assistance    Swallow/Nutrition/ Hydration      -         ADL's   Max-total overall, Max A static sitting EOB with inability to hold head up, declinining since eval  - downgraded 5/19 to Mod ADLs, Min A transfers   global endurance, OOB tolerance, pain management    Mobility   Pt has been refusing, mod A for sup to sit but then returned to supine 2/2 pain.  - mod i bed mobility, supervision tranfers, gait, stairs  participation, pain management, NMR, balance, transfers    Communication      -          Safety/Cognition/ Behavioral Observations     -          Pain   headache  - pain free   positioning and deep breathing exercises    Skin   intact  - free from skin breakdown  turning and positioning      Discharge Planning:  Family here daily and can provide the assist needed. Pt seems to be fluctuating depending on his pain and the day. He was going to OP PTA can  set up again and awaiting DME needs    Team Discussion: Patient was admitted post persistent dural venous sinus thrombosis.Patient has congenital deafness.  Patient has refractory headaches/ ear infection :medication adjusted by MD. Patient limited by language barrier, impulsivity, dizziness, nausea and self limiting behaviors.   Patient on target to meet rehab goals: no,  Patient requires max assist overall with ADLs. Patient requires moderate assistance with mobility. Overall goals at discharge are set for min-supervision assistance.  *See Care Plan and progress notes for long and short-term goals.   Revisions to Treatment Plan:  Downgraded goals 15/7 therapy Neurology consult Lidocaine  patches Interpreter Communication board  Teaching Needs: Safety, medications, transfers, toileting, etc   Current Barriers to Discharge: Decreased caregiver support  Possible Resolutions to Barriers: Family Education Outpatient follow-up     Medical Summary Current Status: ? dizziness vs Orthostatic hyptension-  Refusing therapy frequently due to HA's- changed to 15/7-some inconitnence?  Barriers to Discharge: Medical stability;Other (comments);Uncontrolled Pain;Hypotension;Incontinence;Self-care education  Barriers to Discharge Comments: severe HA's- nausea; no vomiting; max-total A- self limiting??? refusing therapy intermittently- Possible Resolutions to [employer] Focus: downgraded goals to mod A- if he refuses therapy, he can go home- told family-  5/28- d/c-   Continued Need for Acute Rehabilitation Level of Care: The patient requires daily  medical management by a physician with specialized training in physical medicine and rehabilitation for the following reasons: Direction of a multidisciplinary physical rehabilitation program to maximize functional independence : Yes Medical management of patient stability for increased activity during participation in an intensive rehabilitation  regime.: Yes Analysis of laboratory values and/or radiology reports with any subsequent need for medication adjustment and/or medical intervention. : Yes   I attest that I was present, lead the team conference, and concur with the assessment and plan of the team.   [name] 03/30/2024, 1140 am

## 2024-03-31 NOTE — Progress Notes (Signed)
 Physical Therapy Session Note  Patient Details  Name: Samuel Warren MRN: [id number] Date of Birth: Jun 06, 1991  Today's Date: 03/31/2024 PT Individual Time: 0905-1000 PT Individual Time Calculation (min): 55 min   Short Term Goals: Week 2:  PT Short Term Goal 1 (Week 2): Pt will transfer sup to sit w/ min A consistently PT Short Term Goal 2 (Week 2): Pt will improve sitting balance w/ min A PT Short Term Goal 3 (Week 2): Pt will transfer sit to stand w/ min A consistently. PT Short Term Goal 4 (Week 2): Pt will amb w/ mod A w/ LRAD x 50'  Skilled Therapeutic Interventions/Progress Updates: Pt presented in bed in NAD. No interpreter present throughout session. Pt was able to communicate HA present with nsg notified and pain meds received during session. Pt completed supine to sit with modA and stand pivot transfer to TIS to L with modA. Pt transported to main gym for energy conservation. Pt completed squat pivot transfer to L with pt demonstrating more initiation and use of BUE to push off from TIS. PTA providing facilitation for anterior lean to complete appropriate weight shifting. PTA then had pt work on sitting balance with Banker. Pt requiring primarily modA to maintain unsupported sitting balance with tactile cues with pt was able to improve to minA. Pt also worked on reaching to target with alternating BUE. Pt then completed squat pivot transfer back to TIS with pt noted to have increased initiation and requiring minA. Pt transported back to room and compelted stand pivot transfer back to bed with modA. Pt required minA for sit to supine and was supervision to reposition to comfort. Pt left in bed at end of session with bed alarm on, call bell within reach and wife present.      Therapy Documentation Precautions:  Precautions Precautions: Fall, Other (comment) Recall of Precautions/Restrictions: Intact Precaution/Restrictions Comments: R inattention Restrictions Weight Bearing  Restrictions Per Provider Order: No General:   Vital Signs: Therapy Vitals Temp: 98 F (36.7 C) Temp Source: Oral Pulse Rate: 66 Resp: 18 BP: 103/71 Patient Position (if appropriate): Lying Oxygen Therapy SpO2: 99 % O2 Device: Room Air Pain: Pain Assessment Pain Scale: Faces Faces Pain Scale: Hurts a little bit Pain Location: Head Pain Intervention(s): Medication (See eMAR) Mobility:   Locomotion :    Trunk/Postural Assessment :    Balance:   Exercises:   Other Treatments:      Therapy/Group: Individual Therapy  Lacreasha Hinds 03/31/2024, 4:32 PM

## 2024-04-01 LAB — VALPROIC ACID LEVEL: Valproic Acid Lvl: 23 ug/mL — ABNORMAL LOW (ref 50–100)

## 2024-04-01 LAB — PROTIME-INR
INR: 2.7 — ABNORMAL HIGH (ref 0.8–1.2)
Prothrombin Time: 28.5 s — ABNORMAL HIGH (ref 11.4–15.2)

## 2024-04-01 MED ORDER — WARFARIN SODIUM 3 MG PO TABS
3.0000 mg | ORAL_TABLET | Freq: Once | ORAL | Status: AC
  Administered 2024-04-01: 3 mg via ORAL
  Filled 2024-04-01: qty 1

## 2024-04-01 NOTE — Progress Notes (Signed)
 PROGRESS NOTE   Subjective/Complaints:  Pt reports HA 5/10- per nurse, skin irritated on neck from tape and lidoderm  patches.   Per nurse, no more drainage from ears.   ROS: Limited by language and deafness Objective:   No results found.   No results for input(s): "WBC", "HGB", "HCT", "PLT" in the last 72 hours.  No results for input(s): "NA", "K", "CL", "CO2", "GLUCOSE", "BUN", "CREATININE", "CALCIUM " in the last 72 hours.    Intake/Output Summary (Last 24 hours) at 04/01/2024 0853 Last data filed at 03/31/2024 1935 Gross per 24 hour  Intake 600 ml  Output --  Net 600 ml           Physical Exam: Vital Signs Blood pressure 106/76, pulse 72, temperature 98.4 F (36.9 C), resp. rate 18, height 5\' 4"  (1.626 m), weight 65.3 kg, SpO2 98%.          General: sleeping- hard to wake up but finally did; NAD HENT: conjugate gaze; oropharynx dry; no drainage from ear seen CV: regular rate and rhythm; no JVD Pulmonary: CTA B/L; no W/R/R- good air movement GI: soft, NT, ND, (+)BS Psychiatric: appropriate- flat Neurological: deaf  MSK- neck tightness  in upper traps, splenius capitus and levators and scalenes- tight again Musculoskeletal:        General: Normal range of motion.     Cervical back: Neck supple. No tenderness.     Comments: Moving all 4 extremities in bed Skin:    General: Skin is warm and dry.     Comments:  IV out of neck is gone- wounds healing  Neurological:     Mental Status: He is alert.     Comments: [name] makes eye contact with examiner.  Exam limited by language barrier.  Follows simple demonstrated commands.  No facial droop. Mild ataxia Mild R inattention Mild ot moderate searching pattern on B/L finger to nose   Neuro exam appears consistent with prior but difficult to test due to communication 5/17  Assessment/Plan: 1. Functional deficits which require 3+ hours per  day of interdisciplinary therapy in a comprehensive inpatient rehab setting. Physiatrist is providing close team supervision and 24 hour management of active medical problems listed below. Physiatrist and rehab team continue to assess barriers to discharge/monitor patient progress toward functional and medical goals  Care Tool:  Bathing    Body parts bathed by patient: Chest, Abdomen, Front perineal area, Buttocks, Right upper leg, Left upper leg, Right lower leg, Left lower leg, Face, Right arm, Left arm         Bathing assist Assist Level: Moderate Assistance - Patient 50 - 74%     Upper Body Dressing/Undressing Upper body dressing   What is the patient wearing?: Pull over shirt    Upper body assist Assist Level: Moderate Assistance - Patient 50 - 74%    Lower Body Dressing/Undressing Lower body dressing      What is the patient wearing?: Pants     Lower body assist Assist for lower body dressing: Maximal Assistance - Patient 25 - 49%     Toileting Toileting    Toileting assist Assist for toileting: Maximal Assistance - Patient  25 - 49%     Transfers Chair/bed transfer  Transfers assist     Chair/bed transfer assist level: Maximal Assistance - Patient 25 - 49%     Locomotion Ambulation   Ambulation assist      Assist level: Maximal Assistance - Patient 25 - 49% Assistive device: Hand held assist Max distance: 25   Walk 10 feet activity   Assist     Assist level: Maximal Assistance - Patient 25 - 49% Assistive device: Hand held assist   Walk 50 feet activity   Assist Walk 50 feet with 2 turns activity did not occur: Safety/medical concerns  Assist level: Moderate Assistance - Patient - 50 - 74% Assistive device: Hand held assist    Walk 150 feet activity   Assist Walk 150 feet activity did not occur: Safety/medical concerns         Walk 10 feet on uneven surface  activity   Assist Walk 10 feet on uneven surfaces activity did  not occur: Safety/medical concerns         Wheelchair     Assist Is the patient using a wheelchair?: Yes Type of Wheelchair: Manual    Wheelchair assist level: Minimal Assistance - Patient > 75% Max wheelchair distance: 25    Wheelchair 50 feet with 2 turns activity    Assist        Assist Level: Moderate Assistance - Patient 50 - 74%   Wheelchair 150 feet activity     Assist      Assist Level: Maximal Assistance - Patient 25 - 49%   Blood pressure 106/76, pulse 72, temperature 98.4 F (36.9 C), resp. rate 18, height 5\' 4"  (1.626 m), weight 65.3 kg, SpO2 98%.  Medical Problem List and Plan: 1. 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  Antithrombotics: -DVT/anticoagulation:  Pharmaceutical: Coumadin  5/9- Last INR 2.3- managed by pharmacy 5/13- INR 2.1 5/15- INR 2.5 5/16- new goal INR 2.5 to 3.5 since had new stroke per Neuro 5/19- INR 3.0 this AM 5/20- INR a little high at 3.7 5/21- INR 3.2 5/22- INR 2.7-              -antiplatelet therapy: N/A 3. Pain Management: Fioricet  as needed for refractory headaches, Tylenol  as needed  5/9- having HA's- they attempted Topamax - might need to try Elavil - if doesn't improve by tomorrow, will add Elavil  25 mg at bedtime  5/10- started Elavil  25 mg at bedtime- and lidoderm  patches- 2 patches 8pm to 8am- increased Fioricet  to q4 hours prn- cannot have Topamax  due to low Bicarb levels  5/11- HA somewhat better this AM per son by phone- pt continues to point at 10/10- but muscles in neck much looser  5/13- HA 5/10 this AM- might benefit from TrP Injections but hard to assess consent?  5/14- will d/w family  and pt tomorrow  5/15- pt's Brother- told me had severe HA yesterday- Fioricet  taken off by Neurology but they added/increased Depakote  for HA control and possible seizures coverage-   5/16- increased Elavil  to  50 mg at bedtime for HA's- has tylenol  as needed otherwise- cannot do Trp injections since INR is going too high  5/18 discussed with neurology, do not suspect related to intracranial hypotension-no plan for epidural patch at this time.  Will start Zanaflex .  5/19- HA down to 5/10 this AM- if INR isn't higher, might try just upper trap Trp injections tomorrow?  5/22- HA 5/10 per pt- no ear drainage this AM  4. Mood/Behavior/Sleep: Provide emotional support             -antipsychotic agents: N/A 5. Neuropsych/cognition: This patient is? capable of making decisions on his own behalf. 6. Skin/Wound Care: Routine skin checks 7. Fluids/Electrolytes/Nutrition: Routine in and outs with follow-up chemistries 8.  Cerebral edema.  Hypertonic saline discontinued 9.  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  Status post IR for thrombectomy was discharged on Eliquis - now changed to Coumadin  10.  History of stroke/recent viral meningitis.  Completed course of Doxy 11.  Refractory headaches.  Medrol  Dosepak.  Topamax  discontinued due to low bicarb- might need to start Elavil , since HA's still 6/10  5/9- still having constant HA's  5/10- pain up to 10/10 today- got head CT due to 10/10 HA- looks OK- no changes- reexamined pt's neck- very tight musculature- will order Robaxin  750 mg q6 hours prn for neck/posterior HA's- as well as lidocaine  patches nightly- added Elavil  25 mg at bedtime for prevention- and asked therapy to do myofascial release for pt.   5/11- muscles looser- HA appears to be less- appears more comfortable  5/12 not using robaxin  frequently, will schedule AM dose. family indicates headaches/neck pain overall improving  5/13- HA down to 5/10 this AM  5/16- Fioricet  stopped by Neuro-  due to risk of rebound HA's- added Elavil  and then increased to 50 mg at bedtime today.   -cannot do Trp Injections since his INR is going to be 2.5 to 3.5- and that's too high for me to feel comfortable doing trp injections  5/17 neurology following, decreased depakote , encouraged hydration   5/18 discussed with neurology, Zanaflex  was started, consider botox injections for HA  5/20- Per therapy, HA's getting worse and participating with therapy less- will increase Elavil  to 75 mg at bedtime- and verify using lidoderm  patches. Cannot do B blocker or Ca channel blocker due to low BP. Cannot do Trp injections due to INR 3.7  5/22- Will see if family comes here- might be able ot try Trp injections for HA today if INR 2.7- but only today 12.  Hyperlipidemia.  Lipitor  13.  Congenital Deafness.  Patient does use some sign language- but mainly gestures per ASL interpretor 14.  GERD.  Protonix   15. Abd pain/Constipation  5/9- pt clear having constant abd pain- will get CT of abd/pelvis with contrast to assess abd pain   5/10- Abd CT looks good  -LBM 3 days ago- ordered Sorbitol  for constipation  5/11- LBM yesterday  5/12 LBM documented yesterday  5/13- LBM 2 days ago- if no BM by tomorrow, will intervene  5/15- LBM yesterday-so abd pain is better  5/21- LBM yesterday 16. Anemia. Mild. -5/12 HGB 12.3, stable continue to monitor  17. Code stroke  5/14- Was (-)- but being assessed for seizures.  5/15- still has mild R sided weakness-  EEG showed cortical dysfunction on L side- but no seizures seen- but MRI looked stable- will wait for Neuro note today- had another episode yesterday it appears based on nursing notes, that was  weaker standing/sitting than laying down-it appears that this occurs almost every time up with therapy- much weaker than when laying down. 5/16- Neuro spoke with radiology and they said he did actually have a new small stroke that would explain weakness of R side   5/17 neuro  following, appears Dr. Janett Medin may see him today 5/20-5/21 Strength still weaker on R side 18. Orthostatic hypotension  5/15- On Midodrine  5 mg TID and on IVF's- 90s systolic- laying down on chart- he's not dry based on BUN/Cr ratio- Off IVF's but if BP stays low, will restart IVF's.  - will increase Midodrine  to 7.5 mg TID  5/16- will d/w therapy and see how it's doing for him to see if need to increase midodrine  again.   5/18 BP stable, monitor how he does with therapy tomorrow  5/19- changed to 15/7  5/20- BP still low 100's systolic- if drops again, will need to maximize Midodrine  at 10 mg TID.   5/21- BP 104 systolic this AM, but was 120s last night. Con't regimen for now 19. milky discharge from his L ear and some clear discharge on his ear noted by nursing. Unable to see TM but does have thick milky looking discharge L ear.  Will start augmentin  and ofloxacin  drops for L ear. May be affecting balance, consider ENT outpatient f/u.  5/19- HA somewhat better this AM? Maybe this was contributing?  5/20- per staff, HA is worse- per pt, it's better- it's probably better when he wakes up, but gets worse when attempts to get up- has been refusing therapy  5/21- pt denied any HA this AM- or nausea- but woke him up to ask- was sleepy. Was c/o ear pain.  If ear pain doesn't improve, will call ENT  5/22- no ear drainage this AM      LOS: 14 days A FACE TO FACE EVALUATION WAS PERFORMED  Davona Kinoshita 04/01/2024, 8:53 AM

## 2024-04-01 NOTE — Plan of Care (Signed)
  Problem: Consults Goal: RH STROKE PATIENT EDUCATION Description: See Patient Education module for education specifics  Outcome: Progressing   Problem: RH BOWEL ELIMINATION Goal: RH STG MANAGE BOWEL WITH ASSISTANCE Description: STG Manage Bowel with  minimal Assistance. Outcome: Progressing   Problem: RH BLADDER ELIMINATION Goal: RH STG MANAGE BLADDER WITH ASSISTANCE Description: STG Manage Bladder With  minimal Assistance Outcome: Progressing   Problem: RH SKIN INTEGRITY Goal: RH STG SKIN FREE OF INFECTION/BREAKDOWN Description: Manage skin free of infection/ breakdown with minimal assistance Outcome: Progressing   Problem: RH SAFETY Goal: RH STG ADHERE TO SAFETY PRECAUTIONS W/ASSISTANCE/DEVICE Description: STG Adhere to Safety Precautions With minimal  Assistance/Device. Outcome: Progressing   Problem: RH COGNITION-NURSING Goal: RH STG USES MEMORY AIDS/STRATEGIES W/ASSIST TO PROBLEM SOLVE Description: STG Uses Memory Aids/Strategies With minimal Assistance to Problem Solve. Outcome: Progressing   Problem: RH PAIN MANAGEMENT Goal: RH STG PAIN MANAGED AT OR BELOW PT'S PAIN GOAL Description: <4 w/ prns Outcome: Progressing   Problem: RH KNOWLEDGE DEFICIT Goal: RH STG INCREASE KNOWLEGDE OF HYPERLIPIDEMIA Description: Manage increase knowledge of hyperlipidemia with minimal assistance from spouse and family members using educational materials provided Outcome: Progressing Goal: RH STG INCREASE KNOWLEDGE OF STROKE PROPHYLAXIS Description: Manage increase knowledge of stroke prohylaxis with minimal assistance from spouse and family members using educational materials provided Outcome: Progressing

## 2024-04-01 NOTE — Plan of Care (Signed)
  Problem: Consults Goal: RH STROKE PATIENT EDUCATION Description: See Patient Education module for education specifics  Outcome: Progressing   Problem: RH BOWEL ELIMINATION Goal: RH STG MANAGE BOWEL WITH ASSISTANCE Description: STG Manage Bowel with  minimal Assistance. Outcome: Progressing   Problem: RH SKIN INTEGRITY Goal: RH STG SKIN FREE OF INFECTION/BREAKDOWN Description: Manage skin free of infection/ breakdown with minimal assistance Outcome: Progressing   Problem: RH SAFETY Goal: RH STG ADHERE TO SAFETY PRECAUTIONS W/ASSISTANCE/DEVICE Description: STG Adhere to Safety Precautions With minimal  Assistance/Device. Outcome: Progressing   Problem: RH KNOWLEDGE DEFICIT Goal: RH STG INCREASE KNOWLEDGE OF STROKE PROPHYLAXIS Description: Manage increase knowledge of stroke prohylaxis with minimal assistance from spouse and family members using educational materials provided Outcome: Progressing   Problem: RH PAIN MANAGEMENT Goal: RH STG PAIN MANAGED AT OR BELOW PT'S PAIN GOAL Description: <4 w/ prns Outcome: Progressing

## 2024-04-01 NOTE — Progress Notes (Signed)
 Occupational Therapy Weekly Progress Note  Patient Details  Name: Samuel Warren MRN: [id number] Date of Birth: 06/17/1991  Beginning of progress report period: Mar 25, 2024 End of progress report period: Apr 01, 2024  Today's Date: 04/01/2024 OT Individual Time: 1330-1445 OT Individual Time Calculation (min): 75 min    Patient has met 0 of 3 short term goals.  Although patient has not met STG, pt has displayed increased alertness/arousal and increased participation in therapy sessions. Patient is demonstrating determination to increase strength and regain independence. Patient still presenting with global weakness and cervical pain with attempt to manage with modalities such as KT tape and heat.  Patient continues to demonstrate the following deficits: muscle weakness, decreased cardiorespiratoy endurance, impaired timing and sequencing and unbalanced muscle activation, and decreased sitting balance, decreased standing balance, decreased postural control, and decreased balance strategies and therefore will continue to benefit from skilled OT intervention to enhance overall performance with BADL and Reduce care partner burden.  Patient progressing toward long term goals..  Continue plan of care.  OT Short Term Goals Week 2:  OT Short Term Goal 1 (Week 2): Patient will complete LB dressing with mod A OT Short Term Goal 2 (Week 2): Patient will maintain static sitting balance with min A OT Short Term Goal 3 (Week 2): Patient will complete toileting tasks with mod A Week 3:     Skilled Therapeutic Interventions/Progress Updates:      Therapy Documentation Precautions:  Precautions Precautions: Fall, Other (comment) Recall of Precautions/Restrictions: Intact Precaution/Restrictions Comments: R inattention Restrictions Weight Bearing Restrictions Per Provider Order: No General: Pt supine in bed upon OT arrival, agreeable to OT session. Wife present. ASL interpreter present. Increased  arousal during session. KT tape removed from prior session.  Pain: less pain in head/neck than prior, recent medication  ADL: OT providing skilled intervention on ADL retraining in order to increase independence with tasks and increase activity tolerance. Pt completed the following tasks at the current level of assist: Bed mobility: Mod A overall supine><EOB using bed rails from flat bed Toilet transfer: Max A with gestures to use grab bar with both hands, pt following command although severe trunk flexion when standing and BLE weakness Toileting: Max A, pt attempting to assist with limited ability to multitask and BLE weakness. OT assisting with 3/3 steps of toileting, pt had large & loose stool, reported to nsg UB dressing: Max A d/t tight shirt to don/doff, may have been Mod A if shirt looser, pt attempting to assist with task LB dressing: total A, pt standing at grab bars for multiple trials d/t decreased energy to maintain standing for OT to manage pants over hips Footwear: total A for grip socks donning/doffing Shower transfer: using roll in shower chair for safety and decreased trunk support, total A Bathing: Mod A overall, pt able to wash UB, OT washing LB. Pt seated on roll in shower chair Transfers: bed><W/C Max A squat pivot transfer with severe anterior flexion  For transfers and trunk support OT providing multimodal cueing to correct postural deficits with increased time for processing required. OT spoke with scheduler to provide +2 for safety concerns during transfers.    Pt supine in bed with bed alarm activated, 2 bed rails up, call light within reach and 4Ps assessed.   Therapy/Group: Individual Therapy Nila Barth, OTD, OTR/L 04/01/2024, 4:41 PM

## 2024-04-01 NOTE — Progress Notes (Signed)
 PHARMACY - ANTICOAGULATION CONSULT NOTE  Pharmacy Consult for Warfarin Indication:  central venous sinus thrombosis  No Known Allergies  Patient Measurements: Height: 5\' 4"  (162.6 cm) Weight: 65.3 kg (143 lb 15.4 oz) IBW/kg (Calculated) : 59.2 HEPARIN  DW (KG): 65.3  Vital Signs: Temp: 98.4 F (36.9 C) (05/22 0518) BP: 106/76 (05/22 0518) Pulse Rate: 72 (05/22 0518)  Labs: Recent Labs    03/30/24 0535 03/31/24 0457 04/01/24 0638  LABPROT 36.6* 33.1* 28.5*  INR 3.7* 3.2* 2.7*    Estimated Creatinine Clearance: 94.7 mL/min (by C-G formula based on SCr of 0.92 mg/dL).  Assessment: 73 YOM with history of central venous sinus thrombosis on Eliquis  PTA. He returned with new onset of R-sided weakness and cerebral edema.  Patient was initially on IV heparin , then transitioned back to PTA Eliquis .  Pharmacy consulted to transition patient to IV heparin  bridge to Warfarin starting 5/3. IV heparin  stopped on 5/6 with therapeutic INR and currently on Warfarin only.   New INR goal of 2.5 to 3.5  5/22 AM: INR remains therapeutic at 2.7 following 2 mg dose yesterday.  Daily dosing regimen likely closer to ~3 mg daily. Based on chart documentation patient's PO intake has declined over past couple of days, likely also contributing to rapid rise in INR. However Rn reports that patient does not like hospital food and has been having family bringing in meals from home- of which he has been eating 100%.    Goal of Therapy:  INR goal 2.5 to 3.5 Monitor platelets by anticoagulation protocol: Yes   Plan:  Warfarin 3 mg today Daily PT/INR Monitor for signs/symptoms of bleeding.  Chrystie Crass, PharmD Clinical Pharmacist  04/01/2024 8:51 AM

## 2024-04-01 NOTE — Progress Notes (Signed)
 Physical Therapy Session Note  Patient Details  Name: Samuel Warren MRN: [id number] Date of Birth: Dec 12, 1990  Today's Date: 04/01/2024 PT Individual Time: 0905-1000 PT Individual Time Calculation (min): 55 min   Short Term Goals: Week 2:  PT Short Term Goal 1 (Week 2): Pt will transfer sup to sit w/ min A consistently PT Short Term Goal 2 (Week 2): Pt will improve sitting balance w/ min A PT Short Term Goal 3 (Week 2): Pt will transfer sit to stand w/ min A consistently. PT Short Term Goal 4 (Week 2): Pt will amb w/ mod A w/ LRAD x 50'  Skilled Therapeutic Interventions/Progress Updates: Pt presented in bed sleeping but easily aroused and agreeable to therapy. Per gestures pt expressed increased HA. Nsg notified and pain meds received during med pass. Interpreter present throughout session. Pt initially resistive to OOB activity however pt ultimately agreeable with encouragement. Pt required increased time and modA for supine to sit with use of bed rail. Completed stand pivot transfer with heavy modA. Pt continually supporting neck with PTA gently supporting neck in neutral position. PTA noted that residual Ktape was on neck and slight redness. PTA obtained hot pack and placed around neck for comfort. PTA transported pt to day room and participated in Cybex Kinetron 50cm/sec 3 x 2 min for general conditioning, reciprocal motion and forced use of RLE. Pt required tactile cues to maintain neutral hip at pt tended to keep RLE externally rotated. Pt then taken to wall rail and attempted to stand with mirror feedback. Pt was able to initiate stand but unable to reach full hip extension as well as noted increased tremors in BLE. Pt then moved to parallel bars to allow pt to hold both bars however noted no change in standing attempts. Pt with forward flexed posture, difficulty extending trunk, and tremors in BLE. Pt transported back to room and completed squat pviot transfer with modA to bed. Performed sit to  supine with modA with pt able to perform lateral bridges to reposition in bed. Pt left in bed at end of session with bed alarm on, call bell within reach and wife present.       Therapy Documentation Precautions:  Precautions Precautions: Fall, Other (comment) Recall of Precautions/Restrictions: Intact Precaution/Restrictions Comments: R inattention Restrictions Weight Bearing Restrictions Per Provider Order: No General:   Vital Signs: Therapy Vitals Temp: 98 F (36.7 C) Pulse Rate: 73 Resp: 16 BP: 117/85 Patient Position (if appropriate): Lying Oxygen Therapy SpO2: 100 % O2 Device: Room Air      Therapy/Group: Individual Therapy  Elliot Meldrum 04/01/2024, 2:17 PM

## 2024-04-02 LAB — PROTIME-INR
INR: 2.7 — ABNORMAL HIGH (ref 0.8–1.2)
Prothrombin Time: 29.1 s — ABNORMAL HIGH (ref 11.4–15.2)

## 2024-04-02 MED ORDER — MIDODRINE HCL 5 MG PO TABS
10.0000 mg | ORAL_TABLET | Freq: Three times a day (TID) | ORAL | Status: DC
  Administered 2024-04-02 – 2024-04-03 (×5): 10 mg via ORAL
  Filled 2024-04-02 (×6): qty 2

## 2024-04-02 MED ORDER — WARFARIN SODIUM 3 MG PO TABS
3.0000 mg | ORAL_TABLET | Freq: Once | ORAL | Status: AC
  Administered 2024-04-02: 3 mg via ORAL
  Filled 2024-04-02: qty 1

## 2024-04-02 NOTE — Plan of Care (Signed)
  Problem: RH BOWEL ELIMINATION Goal: RH STG MANAGE BOWEL WITH ASSISTANCE Description: STG Manage Bowel with  minimal Assistance. Outcome: Progressing Flowsheets (Taken 04/02/2024 1646) STG: Pt will manage bowels with assistance: 4-Minimum assistance   Problem: RH BLADDER ELIMINATION Goal: RH STG MANAGE BLADDER WITH ASSISTANCE Description: STG Manage Bladder With  minimal Assistance Outcome: Progressing Flowsheets (Taken 04/02/2024 1646) STG: Pt will manage bladder with assistance: 4-Minimal assistance   Problem: RH SKIN INTEGRITY Goal: RH STG SKIN FREE OF INFECTION/BREAKDOWN Description: Manage skin free of infection/ breakdown with minimal assistance Outcome: Progressing   Problem: RH SAFETY Goal: RH STG ADHERE TO SAFETY PRECAUTIONS W/ASSISTANCE/DEVICE Description: STG Adhere to Safety Precautions With minimal  Assistance/Device. Outcome: Progressing   Problem: RH COGNITION-NURSING Goal: RH STG USES MEMORY AIDS/STRATEGIES W/ASSIST TO PROBLEM SOLVE Description: STG Uses Memory Aids/Strategies With minimal Assistance to Problem Solve. Outcome: Not Progressing   Problem: RH PAIN MANAGEMENT Goal: RH STG PAIN MANAGED AT OR BELOW PT'S PAIN GOAL Description: <4 w/ prns Outcome: Not Progressing

## 2024-04-02 NOTE — Plan of Care (Signed)
  Problem: Consults Goal: RH STROKE PATIENT EDUCATION Description: See Patient Education module for education specifics  Outcome: Progressing   Problem: RH BOWEL ELIMINATION Goal: RH STG MANAGE BOWEL WITH ASSISTANCE Description: STG Manage Bowel with  minimal Assistance. Outcome: Progressing   Problem: RH BLADDER ELIMINATION Goal: RH STG MANAGE BLADDER WITH ASSISTANCE Description: STG Manage Bladder With  minimal Assistance Outcome: Progressing   Problem: RH SAFETY Goal: RH STG ADHERE TO SAFETY PRECAUTIONS W/ASSISTANCE/DEVICE Description: STG Adhere to Safety Precautions With minimal  Assistance/Device. Outcome: Progressing   Problem: RH KNOWLEDGE DEFICIT Goal: RH STG INCREASE KNOWLEDGE OF STROKE PROPHYLAXIS Description: Manage increase knowledge of stroke prohylaxis with minimal assistance from spouse and family members using educational materials provided Outcome: Progressing   Problem: RH KNOWLEDGE DEFICIT Goal: RH STG INCREASE KNOWLEGDE OF HYPERLIPIDEMIA Description: Manage increase knowledge of hyperlipidemia with minimal assistance from spouse and family members using educational materials provided Outcome: Progressing

## 2024-04-02 NOTE — Progress Notes (Signed)
 Occupational Therapy Session Note  Patient Details  Name: Samuel Warren MRN: [id number] Date of Birth: 07/05/1991  Today's Date: 04/02/2024 OT Individual Time: 8416-6063 OT Individual Time Calculation (min): 45 min    Short Term Goals: Week 3:  OT Short Term Goal 1 (Week 3): Patient will complete toileting tasks with mod A OT Short Term Goal 2 (Week 3): Patient will maintain static sitting balance with min A OT Short Term Goal 3 (Week 3): Patient will complete LB dressing with mod A  Skilled Therapeutic Interventions/Progress Updates:     Pt received reclined in bed, sleeping waking to OT arrival. Pt presenting to be fatigued with flat affect, receptive to skilled OT session reporting 10/10 pain d/t throbbing headache- OT offering intermittent rest breaks, repositioning, and therapeutic support to optimize participation in therapy session. ASL interpreter present throughout session to optimize participation- OT also utilize gestures and picture board or communication during session. Focused this session on transitional body movements and body awareness to increase overall safety and independence in BADLs and functional transfers. Pt receptive to sitting EOB, however demonstrating significant challenges motor planning technique for log rolling technique with max multimodal cues and gestures requiring mod A overall to bring B LEs off EOB and lift trunk. Spent large portion of time working on sitting balance with Pt heavily leaning forwards and holding head in L laterally flexed position and expressing significant pain in back of head when cues to lift head to midline. Worked on slowly, lifting gaze and bringing R ear to R shoulder to lift head to midline with pt able to tolerate for 4 trials, however declining sitting up any longer d/t pain. Attempted to complete stand from EOB, however Pt unable to maintain trunk in upright position requiring heavy max A so terminated any attempts to stand this  session. Pt then requesting to return to bed. Pt completed lateral scoots to EOB with max A +max multimodal cues and gestures. EOB > supine max A. Spent time positioning Pt with Pillows on L side to facilitate head in midline and slightly shifted towards R to facilitate increased attention to R side, promote improved midline orientation, and facilitate gentle prolonged stretch of L lateral neck muscles. Pt expressing comfort in this position. Pt was left resting in bed with call bell in reach, bed alarm on, and all needs met. Missed 15 minutes of skilled OT treatment 2/2 to pain and fatigue. Will attempt to make up time as schedule and Pt's status allow.   Therapy Documentation Precautions:  Precautions Precautions: Fall, Other (comment) Recall of Precautions/Restrictions: Intact Precaution/Restrictions Comments: R inattention Restrictions Weight Bearing Restrictions Per Provider Order: No   Therapy/Group: Individual Therapy  Geoffery Kiel 04/02/2024, 7:57 AM

## 2024-04-02 NOTE — Progress Notes (Signed)
 PHARMACY - ANTICOAGULATION CONSULT NOTE  Pharmacy Consult for Warfarin Indication:  central venous sinus thrombosis  No Known Allergies  Patient Measurements: Height: 5\' 4"  (162.6 cm) Weight: 61.3 kg (135 lb 2.3 oz) IBW/kg (Calculated) : 59.2 HEPARIN  DW (KG): 65.3  Vital Signs:    Labs: Recent Labs    03/31/24 0457 04/01/24 0638 04/02/24 1038  LABPROT 33.1* 28.5* 29.1*  INR 3.2* 2.7* 2.7*    Estimated Creatinine Clearance: 94.7 mL/min (by C-G formula based on SCr of 0.92 mg/dL).  Assessment: 44 YOM with history of central venous sinus thrombosis on Eliquis  PTA. He returned with new onset of R-sided weakness and cerebral edema.  Patient was initially on IV heparin , then transitioned back to PTA Eliquis .  Pharmacy intially consulted to transition patient to IV heparin  bridge to Warfarin starting 5/3. IV heparin  stopped on 5/6 with therapeutic INR and currently on Warfarin only.   New INR goal of 2.5 to 3.5 per Dr. Christiane Cowing (Neuro)  5/23 AM:  INR remains therapeutic at 2.7 .  Daily dosing regimen likely closer to ~3 mg daily. Based on chart documentation patient's PO intake has declined over past couple of days, likely also contributing to rapid rise in INR. However RN previously reported that patient does not like hospital food and has been having family bringing in meals from home- of which he has been eating 100%.    Goal of Therapy:  INR goal 2.5 to 3.5 Monitor platelets by anticoagulation protocol: Yes   Plan:  Warfarin 3 mg today Daily PT/INR Monitor for signs/symptoms of bleeding.  Alisa Irish, RPh Clinical Pharmacist 04/02/2024 12:37 PM

## 2024-04-02 NOTE — Progress Notes (Signed)
 PROGRESS NOTE   Subjective/Complaints:  Pt wouldn't "talk" to me this AM- I attempted to wake him up 5+ different times over 10 minutes- he'd open his eyes and wave, but then close eyes again.    ROS: Limited by language and deafness Objective:   No results found.   No results for input(s): "WBC", "HGB", "HCT", "PLT" in the last 72 hours.  No results for input(s): "NA", "K", "CL", "CO2", "GLUCOSE", "BUN", "CREATININE", "CALCIUM " in the last 72 hours.    Intake/Output Summary (Last 24 hours) at 04/02/2024 0818 Last data filed at 04/01/2024 1917 Gross per 24 hour  Intake 630 ml  Output --  Net 630 ml           Physical Exam: Vital Signs Blood pressure (!) 124/93, pulse 65, temperature 97.7 F (36.5 C), resp. rate 18, height 5\' 4"  (1.626 m), weight 61.3 kg, SpO2 99%.          General: supine, asleep in bed; hard to wake up;  NAD HENT:  oropharynx dry CV: regular rate and rhythm; no JVD Pulmonary: CTA B/L; no W/R/R- good air movement GI: soft, NT, ND, (+)BS Psychiatric: very sleepy this AM Neurological: deaf- hard to assess  MSK- neck tightness  in upper traps, splenius capitus and levators and scalenes- tight again Musculoskeletal:        General: Normal range of motion.     Cervical back: Neck supple. No tenderness.     Comments: Moving all 4 extremities in bed Skin:    General: Skin is warm and dry.     Comments:  IV out of neck is gone- wounds healing  Neurological:     Mental Status: He is alert.     Comments: [name] makes eye contact with examiner.  Exam limited by language barrier.  Follows simple demonstrated commands.  No facial droop. Mild ataxia Mild R inattention Mild ot moderate searching pattern on B/L finger to nose   Neuro exam appears consistent with prior but difficult to test due to communication 5/17  Assessment/Plan: 1. Functional deficits which require 3+ hours  per day of interdisciplinary therapy in a comprehensive inpatient rehab setting. Physiatrist is providing close team supervision and 24 hour management of active medical problems listed below. Physiatrist and rehab team continue to assess barriers to discharge/monitor patient progress toward functional and medical goals  Care Tool:  Bathing    Body parts bathed by patient: Chest, Abdomen, Front perineal area, Buttocks, Right upper leg, Left upper leg, Right lower leg, Left lower leg, Face, Right arm, Left arm         Bathing assist Assist Level: Moderate Assistance - Patient 50 - 74%     Upper Body Dressing/Undressing Upper body dressing   What is the patient wearing?: Pull over shirt    Upper body assist Assist Level: Moderate Assistance - Patient 50 - 74%    Lower Body Dressing/Undressing Lower body dressing      What is the patient wearing?: Pants     Lower body assist Assist for lower body dressing: Maximal Assistance - Patient 25 - 49%     Toileting Toileting    Toileting  assist Assist for toileting: Maximal Assistance - Patient 25 - 49%     Transfers Chair/bed transfer  Transfers assist     Chair/bed transfer assist level: Maximal Assistance - Patient 25 - 49%     Locomotion Ambulation   Ambulation assist      Assist level: Maximal Assistance - Patient 25 - 49% Assistive device: Hand held assist Max distance: 25   Walk 10 feet activity   Assist     Assist level: Maximal Assistance - Patient 25 - 49% Assistive device: Hand held assist   Walk 50 feet activity   Assist Walk 50 feet with 2 turns activity did not occur: Safety/medical concerns  Assist level: Moderate Assistance - Patient - 50 - 74% Assistive device: Hand held assist    Walk 150 feet activity   Assist Walk 150 feet activity did not occur: Safety/medical concerns         Walk 10 feet on uneven surface  activity   Assist Walk 10 feet on uneven surfaces activity  did not occur: Safety/medical concerns         Wheelchair     Assist Is the patient using a wheelchair?: Yes Type of Wheelchair: Manual    Wheelchair assist level: Minimal Assistance - Patient > 75% Max wheelchair distance: 25    Wheelchair 50 feet with 2 turns activity    Assist        Assist Level: Moderate Assistance - Patient 50 - 74%   Wheelchair 150 feet activity     Assist      Assist Level: Maximal Assistance - Patient 25 - 49%   Blood pressure (!) 124/93, pulse 65, temperature 97.7 F (36.5 C), resp. rate 18, height 5\' 4"  (1.626 m), weight 61.3 kg, SpO2 99%.  Medical Problem List and Plan: 1. 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  Antithrombotics: -DVT/anticoagulation:  Pharmaceutical: Coumadin  5/9- Last INR 2.3- managed by pharmacy 5/13- INR 2.1 5/15- INR 2.5 5/16- new goal INR 2.5 to 3.5 since had new stroke per Neuro 5/19- INR 3.0 this AM 5/20- INR a little high at 3.7 5/21- INR 3.2 5/22- INR 2.7-              -antiplatelet therapy: N/A 3. Pain Management: Fioricet  as needed for refractory headaches, Tylenol  as needed  5/9- having HA's- they attempted Topamax - might need to try Elavil - if doesn't improve by tomorrow, will add Elavil  25 mg at bedtime  5/10- started Elavil  25 mg at bedtime- and lidoderm  patches- 2 patches 8pm to 8am- increased Fioricet  to q4 hours prn- cannot have Topamax  due to low Bicarb levels  5/11- HA somewhat better this AM per son by phone- pt continues to point at 10/10- but muscles in neck much  looser  5/13- HA 5/10 this AM- might benefit from TrP Injections but hard to assess consent?  5/14- will d/w family and pt tomorrow  5/15- pt's Brother- told me had severe HA yesterday- Fioricet  taken off by Neurology but they added/increased Depakote  for HA control and possible seizures coverage-   5/16- increased Elavil  to 50 mg at bedtime for HA's- has tylenol  as needed otherwise- cannot do Trp injections since INR is going too high  5/18 discussed with neurology, do not suspect related to intracranial hypotension-no plan for epidural patch at this time.  Will start Zanaflex .  5/19- HA down to 5/10 this AM- if INR isn't higher, might try just upper trap Trp injections tomorrow?  5/22- HA 5/10 per pt- no ear drainage this AM   5/23- still having HA's per team, but so sleepy this Am, cannot really increase Elavil  dosage- and Neuro was trying to titrate his Dpeakote, but he had too many side effects- cannot do B blockers due to low BP nor Calcium  channel blockers- will need Neuro to manage HA's after d/c.  4. Mood/Behavior/Sleep: Provide emotional support             -antipsychotic agents: N/A 5. Neuropsych/cognition: This patient is? capable of making decisions on his own behalf. 6. Skin/Wound Care: Routine skin checks 7. Fluids/Electrolytes/Nutrition: Routine in and outs with follow-up chemistries 8.  Cerebral edema.  Hypertonic saline discontinued 9.  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  Status post IR for thrombectomy was discharged on Eliquis - now changed to Coumadin  10.  History of stroke/recent viral meningitis.  Completed course of Doxy 11.  Refractory headaches.  Medrol  Dosepak.  Topamax  discontinued due to low bicarb- might need to start Elavil , since HA's still 6/10  5/9- still having constant HA's  5/10- pain up to 10/10 today- got head CT due to 10/10 HA- looks OK- no changes- reexamined pt's neck- very tight musculature- will order Robaxin  750 mg q6 hours prn for  neck/posterior HA's- as well as lidocaine  patches nightly- added Elavil  25 mg at bedtime for prevention- and asked therapy to do myofascial release for pt.   5/11- muscles looser- HA appears to be less- appears more comfortable  5/12 not using robaxin  frequently, will schedule AM dose. family indicates headaches/neck pain overall improving  5/13- HA down to 5/10 this AM  5/16- Fioricet  stopped by Neuro- due to risk of rebound HA's- added Elavil  and then increased to 50 mg at bedtime today.   -cannot do Trp Injections since his INR is going to be 2.5 to 3.5- and that's too high for me to feel comfortable doing trp injections  5/17 neurology following, decreased depakote , encouraged hydration   5/18 discussed with neurology, Zanaflex  was started, consider botox injections for HA  5/20- Per therapy, HA's getting worse and participating with therapy less- will increase Elavil  to 75 mg at bedtime- and verify using lidoderm  patches. Cannot do B blocker or Ca channel blocker due to low BP. Cannot do Trp injections due to INR 3.7  5/22- Will see if family comes here- might be able ot try Trp injections for HA today if INR 2.7- but only today  5/23- will monitor INR and see if next Tuesday can try to do Trp injections 12.  Hyperlipidemia.  Lipitor  13.  Congenital Deafness.  Patient does use some sign language- but mainly gestures per ASL interpretor 14.  GERD.  Protonix   15. Abd pain/Constipation  5/9- pt clear having constant abd pain- will get CT of abd/pelvis with contrast to assess abd pain   5/10- Abd CT looks good  -LBM 3 days ago- ordered Sorbitol  for  constipation  5/11- LBM yesterday  5/12 LBM documented yesterday  5/13- LBM 2 days ago- if no BM by tomorrow, will intervene  5/15- LBM yesterday-so abd pain is better  5/21- LBM yesterday 16. Anemia. Mild. -5/12 HGB 12.3, stable continue to monitor  17. Code stroke  5/14- Was (-)- but being assessed for seizures.  5/15- still has mild R sided  weakness-  EEG showed cortical dysfunction on L side- but no seizures seen- but MRI looked stable- will wait for Neuro note today- had another episode yesterday it appears based on nursing notes, that was weaker standing/sitting than laying down-it appears that this occurs almost every time up with therapy- much weaker than when laying down. 5/16- Neuro spoke with radiology and they said he did actually have a new small stroke that would explain weakness of R side   5/17 neuro following, appears Dr. Janett Medin may see him today 5/20-5/21 Strength still weaker on R side 18. Orthostatic hypotension  5/15- On Midodrine  5 mg TID and on IVF's- 90s systolic- laying down on chart- he's not dry based on BUN/Cr ratio- Off IVF's but if BP stays low, will restart IVF's.  - will increase Midodrine  to 7.5 mg TID  5/16- will d/w therapy and see how it's doing for him to see if need to increase midodrine  again.   5/18 BP stable, monitor how he does with therapy tomorrow  5/19- changed to 15/7  5/20- BP still low 100's systolic- if drops again, will need to maximize Midodrine  at 10 mg TID.   5/21- BP 104 systolic this AM, but was 120s last night. Con't regimen for now 19. milky discharge from his L ear and some clear discharge on his ear noted by nursing. Unable to see TM but does have thick milky looking discharge L ear.  Will start augmentin  and ofloxacin  drops for L ear. May be affecting balance, consider ENT outpatient f/u.  5/19- HA somewhat better this AM? Maybe this was contributing?  5/20- per staff, HA is worse- per pt, it's better- it's probably better when he wakes up, but gets worse when attempts to get up- has been refusing therapy  5/21- pt denied any HA this AM- or nausea- but woke him up to ask- was sleepy. Was c/o ear pain.  If ear pain doesn't improve, will call ENT  5/22- no ear drainage this AM   5/23- If dizziness continues after increasing Midodrine , then I strongly suggest calling ENT over  weekend.    I spent a total of 36   minutes on total care today- >50% coordination of care- due to  D/w nursing and PA about pt's function      LOS: 15 days A FACE TO FACE EVALUATION WAS PERFORMED  Aaminah Forrester 04/02/2024, 8:18 AM

## 2024-04-02 NOTE — Progress Notes (Signed)
 Physical Therapy Session Note  Patient Details  Name: Samuel Warren MRN: [id number] Date of Birth: 12/29/1990  Today's Date: 04/02/2024 PT Individual Time: 1300-1405 PT Individual Time Calculation (min): 65 min   Short Term Goals: Week 2:  PT Short Term Goal 1 (Week 2): Pt will transfer sup to sit w/ min A consistently PT Short Term Goal 2 (Week 2): Pt will improve sitting balance w/ min A PT Short Term Goal 3 (Week 2): Pt will transfer sit to stand w/ min A consistently. PT Short Term Goal 4 (Week 2): Pt will amb w/ mod A w/ LRAD x 50'  Skilled Therapeutic Interventions/Progress Updates: Pt presented in bed with interpreter present initially very lethargic. Pt provided with washcloth to arouse. Pt kept on tapping head and shaking hand in "no" motion. PTA obtained hot pack and placed while pt participated in supine therex. Pt participated in SLR, hip abd/add, heel slides, and manually resisted leg press x 10 each bilaterally. Pt noted to require increased time to complete each set. Pt then provided with 3lb dowel and was able to complete biceps curls 2 x 10 with PTA providing HOH assist with RUE for increased range. Pt then provided with 2lb dowel for chest press which pt was only able to complete x 10. Pt then agreeable to try sitting EOB. Pt able to initiate however required modA to complete to sitting. In sitting pt initially required maxA to maintain sitting balance due to retropulsion. Pt was able to scoot anteriorly with minA and with feet on ground pt was able to improve over time to minA with intermittent bouts of CGA. Pt noted to maintain forward flexed posture and L lateral head lean. Pt was able to complete x 2 Sit to stand with RW however required heavy modA. Pt required facilitation at hips for increased extension and noted to keep knees flexed. Once returned to EOB pt was able to gesture to interpreter ?pain in BLE. Pt then completed sit to supine with light modA and with cues and  increased time was able to reposition self in bed. Pt left in bed at end of session with bed alarm on, call bell within reach and needs met.      Therapy Documentation Precautions:  Precautions Precautions: Fall, Other (comment) Recall of Precautions/Restrictions: Intact Precaution/Restrictions Comments: R inattention Restrictions Weight Bearing Restrictions Per Provider Order: No General: PT Amount of Missed Time (min): 10 Minutes PT Missed Treatment Reason: Patient fatigue;Pain Vital Signs: Therapy Vitals Temp: 98 F (36.7 C) Temp Source: Oral Pulse Rate: 78 Resp: 18 BP: 123/88 Patient Position (if appropriate): Sitting Oxygen Therapy SpO2: 100 % O2 Device: Room Air Pain:     Therapy/Group: Individual Therapy  Syler Norcia 04/02/2024, 4:27 PM

## 2024-04-02 NOTE — Progress Notes (Addendum)
 Patient ID: Samuel Warren, male   DOB: [birth year], 33 y.o.   MRN: [id number]  Spoke with brother to set up family training and have set for Monday from 8:00-11:00 will let team and scheduler aware so can set up interpretors.  1:05 PM Have sent Demo to [employer] for wheelchair evaluation

## 2024-04-03 LAB — CBC
HCT: 39.7 % (ref 39.0–52.0)
Hemoglobin: 12.7 g/dL — ABNORMAL LOW (ref 13.0–17.0)
MCH: 26.6 pg (ref 26.0–34.0)
MCHC: 32 g/dL (ref 30.0–36.0)
MCV: 83.2 fL (ref 80.0–100.0)
Platelets: 255 10*3/uL (ref 150–400)
RBC: 4.77 MIL/uL (ref 4.22–5.81)
RDW: 12.9 % (ref 11.5–15.5)
WBC: 4.5 10*3/uL (ref 4.0–10.5)
nRBC: 0 % (ref 0.0–0.2)

## 2024-04-03 LAB — PROTIME-INR
INR: 3.3 — ABNORMAL HIGH (ref 0.8–1.2)
Prothrombin Time: 33.4 s — ABNORMAL HIGH (ref 11.4–15.2)

## 2024-04-03 MED ORDER — WARFARIN SODIUM 1 MG PO TABS
1.0000 mg | ORAL_TABLET | Freq: Once | ORAL | Status: DC
  Filled 2024-04-03: qty 1

## 2024-04-03 MED ORDER — MIDODRINE HCL 5 MG PO TABS
5.0000 mg | ORAL_TABLET | Freq: Three times a day (TID) | ORAL | Status: DC
  Administered 2024-04-04 – 2024-04-07 (×11): 5 mg via ORAL
  Filled 2024-04-03 (×12): qty 1

## 2024-04-03 MED ORDER — WARFARIN SODIUM 1 MG PO TABS
1.5000 mg | ORAL_TABLET | Freq: Once | ORAL | Status: AC
  Administered 2024-04-03: 1.5 mg via ORAL
  Filled 2024-04-03: qty 1

## 2024-04-03 NOTE — Plan of Care (Signed)
  Problem: RH BOWEL ELIMINATION Goal: RH STG MANAGE BOWEL WITH ASSISTANCE Description: STG Manage Bowel with  minimal Assistance. Outcome: Progressing   Problem: RH SKIN INTEGRITY Goal: RH STG SKIN FREE OF INFECTION/BREAKDOWN Description: Manage skin free of infection/ breakdown with minimal assistance Outcome: Progressing   Problem: RH SAFETY Goal: RH STG ADHERE TO SAFETY PRECAUTIONS W/ASSISTANCE/DEVICE Description: STG Adhere to Safety Precautions With minimal  Assistance/Device. Outcome: Progressing

## 2024-04-03 NOTE — Progress Notes (Signed)
 PROGRESS NOTE   Subjective/Complaints: Appreciate nursing note this morning Continues to have headache and neck pain, kpad ordered Lightheadadness improved  ROS: Continues to have headache and neck pain Objective:   No results found.   Recent Labs    04/03/24 0713  WBC 4.5  HGB 12.7*  HCT 39.7  PLT 255    No results for input(s): "NA", "K", "CL", "CO2", "GLUCOSE", "BUN", "CREATININE", "CALCIUM " in the last 72 hours.    Intake/Output Summary (Last 24 hours) at 04/03/2024 1151 Last data filed at 04/03/2024 1030 Gross per 24 hour  Intake 340 ml  Output 400 ml  Net -60 ml           Physical Exam: Vital Signs Blood pressure 102/75, pulse 80, temperature 97.7 F (36.5 C), temperature source Oral, resp. rate 18, height 5\' 4"  (1.626 m), weight 61.3 kg, SpO2 99%.          General: supine, asleep in bed; hard to wake up;  NAD HENT:  oropharynx dry CV: regular rate and rhythm; no JVD Pulmonary: CTA B/L; no W/R/R- good air movement GI: soft, NT, ND, (+)BS Psychiatric: very sleepy this AM Neurological: deaf- hard to assess  MSK- neck tightness  in upper traps, splenius capitus and levators and scalenes- tight again Musculoskeletal:        General: Normal range of motion.     Cervical back: Neck supple. No tenderness.     Comments: Moving all 4 extremities in bed Skin:    General: Skin is warm and dry.     Comments:  IV out of neck is gone- wounds healing  Neurological:     Mental Status: He is alert.     Comments: [name] makes eye contact with examiner.  Exam limited by language barrier.  Follows simple demonstrated commands.  No facial droop. Mild ataxia Mild R inattention Mild ot moderate searching pattern on B/L finger to nose, stable 5/24  Neuro exam appears consistent with prior but difficult to test due to communication   Assessment/Plan: 1. Functional deficits which require 3+  hours per day of interdisciplinary therapy in a comprehensive inpatient rehab setting. Physiatrist is providing close team supervision and 24 hour management of active medical problems listed below. Physiatrist and rehab team continue to assess barriers to discharge/monitor patient progress toward functional and medical goals  Care Tool:  Bathing    Body parts bathed by patient: Chest, Abdomen, Front perineal area, Buttocks, Right upper leg, Left upper leg, Right lower leg, Left lower leg, Face, Right arm, Left arm         Bathing assist Assist Level: Moderate Assistance - Patient 50 - 74%     Upper Body Dressing/Undressing Upper body dressing   What is the patient wearing?: Pull over shirt    Upper body assist Assist Level: Moderate Assistance - Patient 50 - 74%    Lower Body Dressing/Undressing Lower body dressing      What is the patient wearing?: Pants     Lower body assist Assist for lower body dressing: Maximal Assistance - Patient 25 - 49%     Toileting Toileting    Toileting assist Assist for  toileting: Maximal Assistance - Patient 25 - 49%     Transfers Chair/bed transfer  Transfers assist     Chair/bed transfer assist level: Maximal Assistance - Patient 25 - 49%     Locomotion Ambulation   Ambulation assist      Assist level: Maximal Assistance - Patient 25 - 49% Assistive device: Hand held assist Max distance: 25   Walk 10 feet activity   Assist     Assist level: Maximal Assistance - Patient 25 - 49% Assistive device: Hand held assist   Walk 50 feet activity   Assist Walk 50 feet with 2 turns activity did not occur: Safety/medical concerns  Assist level: Moderate Assistance - Patient - 50 - 74% Assistive device: Hand held assist    Walk 150 feet activity   Assist Walk 150 feet activity did not occur: Safety/medical concerns         Walk 10 feet on uneven surface  activity   Assist Walk 10 feet on uneven surfaces  activity did not occur: Safety/medical concerns         Wheelchair     Assist Is the patient using a wheelchair?: Yes Type of Wheelchair: Manual    Wheelchair assist level: Minimal Assistance - Patient > 75% Max wheelchair distance: 25    Wheelchair 50 feet with 2 turns activity    Assist        Assist Level: Moderate Assistance - Patient 50 - 74%   Wheelchair 150 feet activity     Assist      Assist Level: Maximal Assistance - Patient 25 - 49%   Blood pressure 102/75, pulse 80, temperature 97.7 F (36.5 C), temperature source Oral, resp. rate 18, height 5\' 4"  (1.626 m), weight 61.3 kg, SpO2 99%.  Medical Problem List and Plan: 1. 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  Antithrombotics: -DVT/anticoagulation:  Pharmaceutical: Coumadin  5/9- Last INR 2.3- managed by pharmacy 5/13- INR 2.1 5/15- INR 2.5 5/16- new goal INR 2.5 to 3.5 since had new stroke per Neuro 5/19- INR 3.0 this AM 5/20- INR a little high at 3.7 5/21- INR 3.2 5/24 INR reviewed and is at goal at 3.3  3. Pain Management: Fioricet  as needed for refractory headaches, Tylenol  as needed  5/9- having HA's- they attempted Topamax - might need to try Elavil - if doesn't improve by tomorrow, will add Elavil  25 mg at bedtime  5/10- started Elavil  25 mg at bedtime- and lidoderm  patches- 2 patches 8pm to 8am- increased Fioricet  to q4 hours prn- cannot have Topamax  due to low Bicarb levels  5/11- HA somewhat better this AM per son by phone- pt continues to point at 10/10- but  muscles in neck much looser  5/13- HA 5/10 this AM- might benefit from TrP Injections but hard to assess consent?  5/14- will d/w family and pt tomorrow  5/15- pt's Brother- told me had severe HA yesterday-  Fioricet  taken off by Neurology but they added/increased Depakote  for HA control and possible seizures coverage-   5/16- increased Elavil  to 50 mg at bedtime for HA's- has tylenol  as needed otherwise- cannot do Trp injections since INR is going too high  5/18 discussed with neurology, do not suspect related to intracranial hypotension-no plan for epidural patch at this time.  Will start Zanaflex .  5/19- HA down to 5/10 this AM- if INR isn't higher, might try just upper trap Trp injections tomorrow?  5/22- HA 5/10 per pt- no ear drainage this AM   5/23- still having HA's per team, but so sleepy this Am, cannot really increase Elavil  dosage- and Neuro was trying to titrate his Dpeakote, but he had too many side effects- cannot do B blockers due to low BP nor Calcium  channel blockers- will need Neuro to manage HA's after d/c.   5/24: kpad added for neck pain  4. Mood/Behavior/Sleep: Provide emotional support             -antipsychotic agents: N/A  5. Neuropsych/cognition: This patient is? capable of making decisions on his own behalf.  6. Skin/Wound Care: Routine skin checks  7. Fluids/Electrolytes/Nutrition: Routine in and outs with follow-up chemistries  8.  Cerebral edema.  Hypertonic saline discontinued  9.  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  Status post IR for thrombectomy was discharged on Eliquis - now changed to Coumadin , conitnue  10.  History of stroke/recent viral meningitis.  Completed course of Doxy 11.  Refractory headaches.  Medrol  Dosepak.  Topamax  discontinued due to low bicarb- might need to start Elavil , since HA's still 6/10  5/9- still having constant HA's  5/10- pain up to 10/10 today- got head CT due to 10/10 HA- looks OK- no changes- reexamined pt's  neck- very tight musculature- will order Robaxin  750 mg q6 hours prn for neck/posterior HA's- as well as lidocaine  patches nightly- added Elavil  25 mg at bedtime for prevention- and asked therapy to do myofascial release for pt.   5/11- muscles looser- HA appears to be less- appears more comfortable  5/12 not using robaxin  frequently, will schedule AM dose. family indicates headaches/neck pain overall improving  5/13- HA down to 5/10 this AM  5/16- Fioricet  stopped by Neuro- due to risk of rebound HA's- added Elavil  and then increased to 50 mg at bedtime today.   -cannot do Trp Injections since his INR is going to be 2.5 to 3.5- and that's too high for me to feel comfortable doing trp injections  5/17 neurology following, decreased depakote , encouraged hydration   5/18 discussed with neurology, Zanaflex  was started, consider botox injections for HA  5/20- Per therapy, HA's getting worse and participating with therapy less- will increase Elavil  to 75 mg at bedtime- and verify using lidoderm  patches. Cannot do B blocker or Ca channel blocker due to low BP. Cannot do Trp injections due to INR 3.7  5/22- Will see if family comes here- might be able ot try Trp injections for HA today if INR 2.7- but only today  5/23- will monitor INR and see if next Tuesday can try to do Trp injections 12.  Hyperlipidemia.  Lipitor  13.  Congenital Deafness.  Patient does use some sign language- but mainly gestures per ASL interpretor 14.  GERD.  Protonix   15. Abd pain/Constipation  5/9- pt clear having constant abd pain- will get CT of abd/pelvis with contrast to assess abd pain   5/10- Abd CT looks good  -LBM  3 days ago- ordered Sorbitol  for constipation  5/11- LBM yesterday  5/12 LBM documented yesterday  5/13- LBM 2 days ago- if no BM by tomorrow, will intervene  5/15- LBM yesterday-so abd pain is better  5/21- LBM yesterday 16. Anemia. Mild. -5/12 HGB 12.3, stable continue to monitor  17. Code stroke  5/14-  Was (-)- but being assessed for seizures.  5/15- still has mild R sided weakness-  EEG showed cortical dysfunction on L side- but no seizures seen- but MRI looked stable- will wait for Neuro note today- had another episode yesterday it appears based on nursing notes, that was weaker standing/sitting than laying down-it appears that this occurs almost every time up with therapy- much weaker than when laying down. 5/16- Neuro spoke with radiology and they said he did actually have a new small stroke that would explain weakness of R side   5/17 neuro following, appears Dr. Janett Medin may see him today 5/20-5/21 Strength still weaker on R side 18. Orthostatic hypotension  5/15- On Midodrine  5 mg TID and on IVF's- 90s systolic- laying down on chart- he's not dry based on BUN/Cr ratio- Off IVF's but if BP stays low, will restart IVF's.  - will increase Midodrine  to 7.5 mg TID  5/16- will d/w therapy and see how it's doing for him to see if need to increase midodrine  again.   5/18 BP stable, monitor how he does with therapy tomorrow  5/19- changed to 15/7  5/20- BP still low 100's systolic- if drops again, will need to maximize Midodrine  at 10 mg TID.   5/21- BP 104 systolic this AM, but was 120s last night. Con't regimen for now 19. milky discharge from his L ear and some clear discharge on his ear noted by nursing. Unable to see TM but does have thick milky looking discharge L ear.  Will start augmentin  and ofloxacin  drops for L ear. May be affecting balance, consider ENT outpatient f/u.  5/19- HA somewhat better this AM? Maybe this was contributing?  5/20- per staff, HA is worse- per pt, it's better- it's probably better when he wakes up, but gets worse when attempts to get up- has been refusing therapy  5/21- pt denied any HA this AM- or nausea- but woke him up to ask- was sleepy. Was c/o ear pain.  If ear pain doesn't improve, will call ENT  5/22- no ear drainage this AM   5/23- If dizziness continues  after increasing Midodrine , then I strongly suggest calling ENT over weekend.   5/24: denies dizziness       LOS: 16 days A FACE TO FACE EVALUATION WAS PERFORMED  Samuel Warren 04/03/2024, 11:51 AM

## 2024-04-03 NOTE — Progress Notes (Signed)
 Given medication this morning. Continues to endorse headache and pressure with his head. Endorsing neck pain as well. Discoloration and darkening of skin on the right side observed. No sensation of being nauseous this morning. No reports of feeling lightheaded this morning. Increase in energy and less lethargy. Increasingly alert and able to answer questions. Able to make his needs known. Eating breakfast this morning demonstrating an improvement with his appetite.

## 2024-04-03 NOTE — Progress Notes (Signed)
 Physical Therapy Session Note  Patient Details  Name: Samuel Warren MRN: [id number] Date of Birth: August 28, 1991  Today's Date: 04/03/2024 PT Individual Time: 0915-1000 PT Individual Time Calculation (min): 45 min   Short Term Goals:  Week 2:  PT Short Term Goal 1 (Week 2): Pt will transfer sup to sit w/ min A consistently PT Short Term Goal 2 (Week 2): Pt will improve sitting balance w/ min A PT Short Term Goal 3 (Week 2): Pt will transfer sit to stand w/ min A consistently. PT Short Term Goal 4 (Week 2): Pt will amb w/ mod A w/ LRAD x 50'  Skilled Therapeutic Interventions/Progress Updates:    Session started late due to nursing needs. ASL interpreter present during session to assist with communication but still difficulty. Session focused on NMR to address bed mobility and sitting balance with focus on pt activation and  initiation. Extra time and use of bedrails for support but able to perform supine to sit with min A to facilitate at trunk. Focused on NMR for balance retraining, core activation, postural control, and functional strengthening of UE's to utilize for lateral leans/balance and carryover to bed mobility. Tolerated about 20 min EOB but as fatigued, significant forward flexion and up to max A needed. Overall required heavy min to heavy mod for balance and transitions. Returned to supine with mod assist and then encouraged pt to problem solve repositioning in the bed. Worked on bridging, scooting, and rolling. Positioned in L sidelying with pillows for support and educated pt on use of bed controls to adjust position as well. Encouraged pt to attempt mobility first before allowing staff to assist to build functional strength. Pt had declined OOB (offerered TIS and recliner). All need in reach and mother at bedside.   Therapy Documentation Precautions:  Precautions Precautions: Fall, Other (comment) Recall of Precautions/Restrictions: Intact Precaution/Restrictions Comments: R  inattention Restrictions Weight Bearing Restrictions Per Provider Order: No General: PT Amount of Missed Time (min): 15 Minutes PT Missed Treatment Reason: Nursing care   Pain: Pain Assessment Pain Scale: Faces Faces Pain Scale: Hurts little more Pain Location: Head Pain Intervention(s): Medication (See eMAR)     Therapy/Group: Individual Therapy  Gita Lamb Amadeo June, PT, DPT, CBIS  04/03/2024, 11:52 AM

## 2024-04-03 NOTE — Progress Notes (Signed)
 Occupational Therapy Session Note  Patient Details  Name: Samuel Warren MRN: [id number] Date of Birth: 05/11/1991  Today's Date: 04/03/2024 OT Individual Time: 1191-4782 OT Individual Time Calculation (min): 73 min    Short Term Goals: Week 3:  OT Short Term Goal 1 (Week 3): Patient will complete toileting tasks with mod A OT Short Term Goal 2 (Week 3): Patient will maintain static sitting balance with min A OT Short Term Goal 3 (Week 3): Patient will complete LB dressing with mod A  Skilled Therapeutic Interventions/Progress Updates:    Patient received in bed initially reporting pain in neck and not wanting to participate.  Interpreter known to patient present in room.  Patient somewhat restless and bed and when asked if he needed to go to the bathroom he indicated yes.  Patient transferred to chair with max assist.  Patient starts lifting from surface but then pulls toward sitting.  Patient grabbing at neck and pulling self toward flexion.  Safely got patient to chair and transported to bathroom.  Patient still indicates neck is bothering him - while flexed forward and laterally flexed to left.  Patient's brief soaked, but patient indicated need to have a BM.  Assisted to toilet in similar fashion stand pivot with max assist.  Needed a second person to aide with clothing management.  While seated tried to encourage patient to shower.  Patient refused multiple times stating he would be cold.  Worked on alignment of trunk and head/neck while seated.  Gently readjusting head toward midline.  Patient then able to explore movement - rotation and lateral bending.  Patient voided urine on toilet unable to have BM.  Patient assisted back to chair following clothing management / hygiene.  Fabricated neck roll to support head, lateral support to help orient trunk toward midline.  Built up foot pedals on chair to allow feet in contact with surface, and placed patient into significant tilt to allow gravity to  let head rest.  Transported to gym to work on postural control.  Worked on sitting with feet on floor and anterior pelvic tilt - sustained - followed with active thoracic extension and weight shift toward right.  Patient fearful of rightward weight shift but with repetition able to actively shift toward right side.  Patient returned to room, and agreed to stay up in wheelchair x 1 hour to build tolerance.  Nursing staff aware.  Therapy Documentation Precautions:  Precautions Precautions: Fall, Other (comment) Recall of Precautions/Restrictions: Intact Precaution/Restrictions Comments: R inattention Restrictions Weight Bearing Restrictions Per Provider Order: Yes   Pain:  Unscored pain in neck     Therapy/Group: Individual Therapy  [name] M 04/03/2024, 3:35 PM

## 2024-04-03 NOTE — Progress Notes (Addendum)
 PHARMACY - ANTICOAGULATION CONSULT NOTE  Pharmacy Consult for Warfarin Indication:  central venous sinus thrombosis  No Known Allergies  Patient Measurements: Height: 5\' 4"  (162.6 cm) Weight: 61.3 kg (135 lb 2.3 oz) IBW/kg (Calculated) : 59.2 HEPARIN  DW (KG): 65.3  Vital Signs: Temp: 97.7 F (36.5 C) (05/24 0447) Temp Source: Oral (05/24 0447) BP: 102/75 (05/24 0447) Pulse Rate: 80 (05/24 0447)  Labs: Recent Labs    04/01/24 0638 04/02/24 1038 04/03/24 0713  HGB  --   --  12.7*  HCT  --   --  39.7  PLT  --   --  255  LABPROT 28.5* 29.1* 33.4*  INR 2.7* 2.7* 3.3*    Estimated Creatinine Clearance: 94.7 mL/min (by C-G formula based on SCr of 0.92 mg/dL).  Assessment: 50 YOM with history of central venous sinus thrombosis on Eliquis  PTA. He returned with new onset of R-sided weakness and cerebral edema.  Patient was initially on IV heparin , then transitioned back to PTA Eliquis .  Pharmacy intially consulted to transition patient to IV heparin  bridge to Warfarin starting 5/3. IV heparin  stopped on 5/6 with therapeutic INR and currently on Warfarin only.   New INR goal of 2.5 to 3.5 per Dr. Christiane Cowing (Neuro) due to new stroke.  5/24 AM:  INR remains therapeutic at 3.3, therapeutic but significant increase in INR from 2.7 yesterday to 3.3 today.  Hgb 12.7 stable, ptlc wnl stable. No bleeding noted.   RN on 5/22 previously reported that patient does not like hospital food and has been having family bringing in meals from home.   On 5/24 RN reports yesterday no food. Today ate 15% of lunch and 0% of breakfast     Goal of Therapy:  INR goal 2.5 to 3.5 Monitor platelets by anticoagulation protocol: Yes   Plan:  Warfarin 1.5 mg today x1  Daily PT/INR Monitor for signs/symptoms of bleeding.  Alisa Irish, RPh Clinical Pharmacist 04/03/2024 1:52 PM

## 2024-04-03 NOTE — Progress Notes (Signed)
 Physical Therapy Weekly Progress Note  Patient Details  Name: Samuel Warren MRN: [id number] Date of Birth: [birth year]  Beginning of progress report period: Mar 26, 2024 End of progress report period: Apr 03, 2024  Today's Date: 04/05/2024 PT Individual Time: 0800-0900 PT Individual Time Calculation (min): 60 min   Patient has met 0 of 4 short term goals.  Pt has made limited progress this week in mobility. Pt continues to be limited by HA and limited participation in therapies during first part of week. By the end of this period pt was more participatory however continues to demonstrate poor trunk control, standing tolerance, and requires mod A for bed mobility, mod to maxA for transfers. Communication continues to be a barrier as pt is deaf and non-english speaking. Due to ongoing deficits anticipate pt will d/c at w/c level with family education scheduled for Monday 5/26.   Patient continues to demonstrate the following deficits muscle weakness and muscle paralysis and impaired timing and sequencing, unbalanced muscle activation, motor apraxia, decreased coordination, and decreased motor planning and therefore will continue to benefit from skilled PT intervention to increase functional independence with mobility.  Patient not progressing toward long term goals.  See goal revision..  Plan of care revisions: Pt goal for gait D/C'd for safety and goals downgraded to mod for transfers.  PT Short Term Goals Week 3:  PT Short Term Goal 1 (Week 3): Pt will transfer sup to sit w/ min A consistently PT Short Term Goal 1 - Progress (Week 3): Partly met PT Short Term Goal 2 (Week 3): Pt will sit EOB w/ min A PT Short Term Goal 2 - Progress (Week 3): Partly met PT Short Term Goal 3 (Week 3): Pt will transfer sit to stand w/ mod A PT Short Term Goal 3 - Progress (Week 3): Revised due to lack of progress PT Short Term Goal 4 (Week 3): Pt will amb w/ LRAD and mod A x 50' PT Short Term Goal 5 - Progress  (Week 3): Discontinued (comment) (pt goal D/C'd 2/2 lack of progress and for safety)  Skilled Therapeutic Interventions/Progress Updates:      Therapy Documentation Precautions:  Precautions Precautions: Fall, Other (comment) Recall of Precautions/Restrictions: Intact Precaution/Restrictions Comments: R inattention Restrictions Weight Bearing Restrictions Per Provider Order: No General:      Therapy/Group: Individual Therapy  Suezette Lafave P Nandana Krolikowski 04/05/2024, 12:37 PM

## 2024-04-04 ENCOUNTER — Encounter (HOSPITAL_COMMUNITY): Payer: Self-pay | Admitting: Physical Medicine and Rehabilitation

## 2024-04-04 ENCOUNTER — Other Ambulatory Visit: Payer: Self-pay

## 2024-04-04 LAB — PROTIME-INR
INR: 3.1 — ABNORMAL HIGH (ref 0.8–1.2)
Prothrombin Time: 32.3 s — ABNORMAL HIGH (ref 11.4–15.2)

## 2024-04-04 MED ORDER — WARFARIN SODIUM 2.5 MG PO TABS
2.5000 mg | ORAL_TABLET | Freq: Every day | ORAL | Status: DC
  Filled 2024-04-04: qty 1

## 2024-04-04 MED ORDER — ENSURE ENLIVE PO LIQD
237.0000 mL | Freq: Two times a day (BID) | ORAL | Status: DC
  Administered 2024-04-04 – 2024-04-07 (×6): 237 mL via ORAL

## 2024-04-04 MED ORDER — WARFARIN SODIUM 3 MG PO TABS
3.0000 mg | ORAL_TABLET | Freq: Every day | ORAL | Status: AC
  Administered 2024-04-04: 3 mg via ORAL
  Filled 2024-04-04: qty 1

## 2024-04-04 MED ORDER — AMITRIPTYLINE HCL 25 MG PO TABS
50.0000 mg | ORAL_TABLET | Freq: Every day | ORAL | Status: DC
  Administered 2024-04-04 – 2024-04-06 (×3): 50 mg via ORAL
  Filled 2024-04-04 (×3): qty 2

## 2024-04-04 MED ORDER — WARFARIN SODIUM 3 MG PO TABS
3.0000 mg | ORAL_TABLET | Freq: Every day | ORAL | Status: DC
  Administered 2024-04-05 – 2024-04-06 (×2): 3 mg via ORAL
  Filled 2024-04-04 (×3): qty 1

## 2024-04-04 NOTE — Progress Notes (Signed)
 Occupational Therapy Session Note  Patient Details  Name: Samuel Warren MRN: [id number] Date of Birth: Nov 06, 1991  Today's Date: 04/04/2024 OT Individual Time: 0845-1000 OT Individual Time Calculation (min): 75 min    Short Term Goals: Week 2:  OT Short Term Goal 1 (Week 2): Patient will complete LB dressing with mod A OT Short Term Goal 1 - Progress (Week 2): Progressing toward goal OT Short Term Goal 2 (Week 2): Patient will maintain static sitting balance with min A OT Short Term Goal 2 - Progress (Week 2): Progressing toward goal OT Short Term Goal 3 (Week 2): Patient will complete toileting tasks with mod A OT Short Term Goal 3 - Progress (Week 2): Progressing toward goal Week 3:  OT Short Term Goal 1 (Week 3): Patient will complete toileting tasks with mod A OT Short Term Goal 2 (Week 3): Patient will maintain static sitting balance with min A OT Short Term Goal 3 (Week 3): Patient will complete LB dressing with mod A Week 4:     Skilled Therapeutic Interventions/Progress Updates:    1:1 Pt receieved in the bed and was laying down and his mother was feeding him. Working on initiating coming to the EOB with max encouragement. Pt required mod A to bring trunk up into upright sitting; pt with significant forward posture and difficulty achieving and then maintaining upright posture and with neck flexed forward and to the left. Max A transfer into the w/c. AAROM with TIS tilted all the way back with assistance for stretch - pt allowed this. No c/o HA just neck pain from back of head to clavicle esp on left.   Pt setup with meal tray in front of the mirror in the room in the chair with focus on self feeding with utensils and maintaining an upright head posture. Pt required min A to use right hand (both with finger foods and with utensil) to fully bring it to his mouth. Pt required max cues to bring head to midline actively and then sustain. Participated eating this way for ~ 45 min before  assisting with supporting his head at midline in upright sitting to self feed. Significantly a long time to eat. Pt does demonstrate weakness bilaterally right >left. Participated in toothbrushing with bilateral hands to setup toothpaste on brush and brushing teeth. Pt required A to finish brushing thoroughly. Pt left up in chair with chair tilted back to maintain head at midline and decr flexion. Heat pad applied to left side of neck.   2nd session - pt received in the bed. Focus on initiating donning pants on in supported sitting in bed - bringing LE up and holding on to pants with hands. Pt very slowly participated in threading but still required hands on assistance for every step. Attempted to stand to pull them up with STEDY. Pt required HOH cues for sequencing to come to EOB with bed rail with max A for trunk control. Only able to maintain sitting for 2 seconds before strong forward flexion without attempt to correct. Pt reached out for STEDY grab bar and with max A came into standing. Pants pull up with total A. Pt sat on perched and taken to gym with max A for upright trunk control with opportunities to correct along the way. Transitioned to mat with max A and into supine with legs hanging off mat for a hip stretch. Then into full supine with LEs supported on the mat and manual stretches AROM to neck on the mat with  relief and success of greater ROM with decr pain (not gone). Also performed sidelying on right side for neck stretch. Transitioned into sitting with again with max cues for sequencing and for trunk support into sitting. Therapist sat behind pt on ball with elevated bedside table in front of pt to reach for squigz with alternating UEs promoting upright posture and neck upright. Pt requires extra time and max cues throughout session for attention to balance, posture adjustments and balance reactions.   Pt ambulated from one mat to the other 3 musketeers with max A with facilitation for upright  posture and for bigger step with right Le to advance it forward. Pt transferred into w/c and then into bed with more effort but still requires max A and facilitation for trunk support. Educated pt and pt's mother on trying to lay flat in the bed instead of with HOB up causing neck flexion.  Head pad left on neck and then retrieved.   Therapy Documentation Precautions:  Precautions Precautions: Fall, Other (comment) Recall of Precautions/Restrictions: Intact Precaution/Restrictions Comments: R inattention Restrictions Weight Bearing Restrictions Per Provider Order: No  Pain: Ongoing neck pain  left > right- performed AAROM, stretching and heat   Therapy/Group: Individual Therapy  Samuel Warren Samuel Warren 04/04/2024, 2:39 PM

## 2024-04-04 NOTE — Progress Notes (Signed)
 PROGRESS NOTE   Subjective/Complaints: No new complaints this morning Seems that he likes kpad Discussed that he may benefit from Botox outpatient for torticollis  ROS: Continues to have headache and neck pain, improved with kpad Objective:   No results found.   Recent Labs    04/03/24 0713  WBC 4.5  HGB 12.7*  HCT 39.7  PLT 255    No results for input(s): "NA", "K", "CL", "CO2", "GLUCOSE", "BUN", "CREATININE", "CALCIUM " in the last 72 hours.    Intake/Output Summary (Last 24 hours) at 04/04/2024 1407 Last data filed at 04/04/2024 0453 Gross per 24 hour  Intake --  Output 500 ml  Net -500 ml           Physical Exam: Vital Signs Blood pressure (!) 115/90, pulse 82, temperature 97.8 F (36.6 C), temperature source Oral, resp. rate 18, height 5\' 4"  (1.626 m), weight 61.3 kg, SpO2 95%. General: supine, asleep in bed; hard to wake up;  NAD HENT:  oropharynx dry, +torticollis CV: regular rate and rhythm; no JVD Pulmonary: CTA B/L; no W/R/R- good air movement GI: soft, NT, ND, (+)BS Psychiatric: very sleepy this AM Neurological: deaf- hard to assess  MSK- neck tightness  in upper traps, splenius capitus and levators and scalenes- tight again Musculoskeletal:        General: Normal range of motion.     Cervical back: Neck supple. No tenderness.     Comments: Moving all 4 extremities in bed Skin:    General: Skin is warm and dry.     Comments:  IV out of neck is gone- wounds healing  Neurological:     Mental Status: He is alert.     Comments: [name] makes eye contact with examiner.  Exam limited by language barrier.  Follows simple demonstrated commands.  No facial droop. Mild ataxia Mild R inattention Mild ot moderate searching pattern on B/L finger to nose, stable 5/25  Neuro exam appears consistent with prior but difficult to test due to communication   Assessment/Plan: 1. Functional  deficits which require 3+ hours per day of interdisciplinary therapy in a comprehensive inpatient rehab setting. Physiatrist is providing close team supervision and 24 hour management of active medical problems listed below. Physiatrist and rehab team continue to assess barriers to discharge/monitor patient progress toward functional and medical goals  Care Tool:  Bathing    Body parts bathed by patient: Chest, Abdomen, Front perineal area, Buttocks, Right upper leg, Left upper leg, Right lower leg, Left lower leg, Face, Right arm, Left arm         Bathing assist Assist Level: Moderate Assistance - Patient 50 - 74%     Upper Body Dressing/Undressing Upper body dressing   What is the patient wearing?: Pull over shirt    Upper body assist Assist Level: Moderate Assistance - Patient 50 - 74%    Lower Body Dressing/Undressing Lower body dressing      What is the patient wearing?: Pants     Lower body assist Assist for lower body dressing: Maximal Assistance - Patient 25 - 49%     Toileting Toileting    Toileting assist Assist for toileting:  2 Helpers     Transfers Chair/bed transfer  Transfers assist     Chair/bed transfer assist level: Maximal Assistance - Patient 25 - 49%     Locomotion Ambulation   Ambulation assist      Assist level: Maximal Assistance - Patient 25 - 49% Assistive device: Hand held assist Max distance: 25   Walk 10 feet activity   Assist     Assist level: Maximal Assistance - Patient 25 - 49% Assistive device: Hand held assist   Walk 50 feet activity   Assist Walk 50 feet with 2 turns activity did not occur: Safety/medical concerns  Assist level: Moderate Assistance - Patient - 50 - 74% Assistive device: Hand held assist    Walk 150 feet activity   Assist Walk 150 feet activity did not occur: Safety/medical concerns         Walk 10 feet on uneven surface  activity   Assist Walk 10 feet on uneven surfaces  activity did not occur: Safety/medical concerns         Wheelchair     Assist Is the patient using a wheelchair?: Yes Type of Wheelchair: Manual    Wheelchair assist level: Minimal Assistance - Patient > 75% Max wheelchair distance: 25    Wheelchair 50 feet with 2 turns activity    Assist        Assist Level: Moderate Assistance - Patient 50 - 74%   Wheelchair 150 feet activity     Assist      Assist Level: Maximal Assistance - Patient 25 - 49%   Blood pressure (!) 115/90, pulse 82, temperature 97.8 F (36.6 C), temperature source Oral, resp. rate 18, height 5\' 4"  (1.626 m), weight 61.3 kg, SpO2 95%.  Medical Problem List and Plan: 1. 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  Antithrombotics: -DVT/anticoagulation:  Pharmaceutical: Coumadin  5/9- Last INR 2.3- managed by pharmacy 5/13- INR 2.1 5/15- INR 2.5 5/16- new goal INR 2.5 to 3.5 since had new stroke per Neuro 5/19- INR 3.0 this AM 5/20- INR a little high at 3.7 5/21- INR 3.2 5/24 INR reviewed and is at goal at 3.3  3. Pain Management: Fioricet  as needed for refractory headaches, Tylenol  as needed  5/9- having HA's- they attempted Topamax - might need to try Elavil - if doesn't improve by tomorrow, will add Elavil  25 mg at bedtime  5/10- started Elavil  25 mg at bedtime- and lidoderm  patches- 2 patches 8pm to 8am- increased Fioricet  to q4 hours prn- cannot have Topamax  due to low Bicarb levels  5/11- HA somewhat better this AM per son by phone- pt continues to point at 10/10- but  muscles in neck much looser  5/13- HA 5/10 this AM- might benefit from TrP Injections but hard to assess consent?  5/14- will d/w family and pt tomorrow  5/15- pt's Brother- told me had severe HA yesterday- Fioricet  taken off by Neurology  but they added/increased Depakote  for HA control and possible seizures coverage-   5/16- increased Elavil  to 50 mg at bedtime for HA's- has tylenol  as needed otherwise- cannot do Trp injections since INR is going too high  5/18 discussed with neurology, do not suspect related to intracranial hypotension-no plan for epidural patch at this time.  Will start Zanaflex .  5/19- HA down to 5/10 this AM- if INR isn't higher, might try just upper trap Trp injections tomorrow?  5/22- HA 5/10 per pt- no ear drainage this AM   5/23- still having HA's per team, but so sleepy this Am, cannot really increase Elavil  dosage- and Neuro was trying to titrate his Dpeakote, but he had too many side effects- cannot do B blockers due to low BP nor Calcium  channel blockers- will need Neuro to manage HA's after d/c.   5/24: kpad added for neck pain  4. Mood/Behavior/Sleep: Provide emotional support             -antipsychotic agents: N/A  5. Neuropsych/cognition: This patient is? capable of making decisions on his own behalf.  6. Skin/Wound Care: Routine skin checks  7. Fluids/Electrolytes/Nutrition: Routine in and outs with follow-up chemistries  8.  Cerebral edema.  Hypertonic saline discontinued  9.  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  Status post IR for thrombectomy was discharged on Eliquis - now changed to Coumadin , conitnue  10.  History of stroke/recent viral meningitis.  Completed course of Doxy 11.  Refractory headaches.  Medrol  Dosepak.  Topamax  discontinued due to low bicarb- might need to start Elavil , since HA's still 6/10  5/9- still having constant HA's  5/10- pain up to 10/10 today- got head CT due to 10/10 HA- looks OK- no changes- reexamined pt's  neck- very tight musculature- will order Robaxin  750 mg q6 hours prn for neck/posterior HA's- as well as lidocaine  patches nightly- added Elavil  25 mg at bedtime for prevention- and asked therapy to do myofascial release for pt.   5/11- muscles looser- HA appears to be less- appears more comfortable  5/12 not using robaxin  frequently, will schedule AM dose. family indicates headaches/neck pain overall improving  5/13- HA down to 5/10 this AM  5/16- Fioricet  stopped by Neuro- due to risk of rebound HA's- added Elavil  and then increased to 50 mg at bedtime today.   -cannot do Trp Injections since his INR is going to be 2.5 to 3.5- and that's too high for me to feel comfortable doing trp injections  5/17 neurology following, decreased depakote , encouraged hydration   5/18 discussed with neurology, Zanaflex  was started, consider botox injections for HA  5/20- Per therapy, HA's getting worse and participating with therapy less- will increase Elavil  to 75 mg at bedtime- and verify using lidoderm  patches. Cannot do B blocker or Ca channel blocker due to low BP. Cannot do Trp injections due to INR 3.7  5/22- Will see if family comes here- might be able ot try Trp injections for HA today if INR 2.7- but only today  5/23- will monitor INR and see if next Tuesday can try to do Trp injections 12.  Hyperlipidemia.  Continue Lipitor   13.  Congenital Deafness.  Patient does use some sign language- but mainly gestures per ASL interpretor 14.  GERD.  Continue Protonix    15. Abd pain/Constipation  5/9- pt clear having constant abd pain- will get CT of abd/pelvis with contrast to assess abd pain   5/10- Abd CT looks good  -LBM 3  days ago- ordered Sorbitol  for constipation  5/11- LBM yesterday  5/12 LBM documented yesterday  5/13- LBM 2 days ago- if no BM by tomorrow, will intervene  5/15- LBM yesterday-so abd pain is better  5/21- LBM yesterday  16. Anemia. Mild. -5/12 HGB 12.3, stable continue to monitor   17. Code stroke  5/14- Was (-)- but being assessed for seizures.  5/15- still has mild R sided weakness-  EEG showed cortical dysfunction on L side- but no seizures seen- but MRI looked stable- will wait for Neuro note today- had another episode yesterday it appears based on nursing notes, that was weaker standing/sitting than laying down-it appears that this occurs almost every time up with therapy- much weaker than when laying down. 5/16- Neuro spoke with radiology and they said he did actually have a new small stroke that would explain weakness of R side   5/17 neuro following, appears Dr. Janett Medin may see him today 5/20-5/21 Strength still weaker on R side  18. Orthostatic hypotension  5/15- On Midodrine  5 mg TID and on IVF's- 90s systolic- laying down on chart- he's not dry based on BUN/Cr ratio- Off IVF's but if BP stays low, will restart IVF's.  - will increase Midodrine  to 7.5 mg TID  5/16- will d/w therapy and see how it's doing for him to see if need to increase midodrine  again.   5/18 BP stable, monitor how he does with therapy tomorrow  5/19- changed to 15/7  5/20- BP still low 100's systolic- if drops again, will need to maximize Midodrine  at 10 mg TID.   Diastolic BP mildly elevated, continue to monitor TID  19. milky discharge from his L ear and some clear discharge on his ear noted by nursing. Unable to see TM but does have thick milky looking discharge L ear.  Will start augmentin  and ofloxacin  drops for L ear. May be affecting balance, consider ENT outpatient f/u.  5/19- HA somewhat better this AM? Maybe this was contributing?  5/20- per staff, HA is worse- per pt, it's better- it's probably better when he wakes up, but gets worse when attempts to get up- has been refusing therapy  5/21- pt denied any HA this AM- or nausea- but woke him up to ask- was sleepy. Was c/o ear pain.  If ear pain doesn't improve, will call ENT  5/22- no ear drainage this AM   5/23- If dizziness  continues after increasing Midodrine , then I strongly suggest calling ENT over weekend.   5/24: denies dizziness  20. Torticollis: discussed that he may benefit from outpatient Botox     LOS: 17 days A FACE TO FACE EVALUATION WAS PERFORMED  Lavell Portugal P Elfreida Heggs 04/04/2024, 2:07 PM

## 2024-04-04 NOTE — Progress Notes (Signed)
 PHARMACY - ANTICOAGULATION CONSULT NOTE  Pharmacy Consult for Warfarin Indication:  central venous sinus thrombosis  No Known Allergies  Patient Measurements: Height: 5\' 4"  (162.6 cm) Weight: 61.3 kg (135 lb 2.3 oz) IBW/kg (Calculated) : 59.2 HEPARIN  DW (KG): 65.3  Vital Signs: Temp: 97.8 F (36.6 C) (05/25 0453) Temp Source: Oral (05/25 0453) BP: 115/90 (05/25 0453) Pulse Rate: 82 (05/25 0453)  Labs: Recent Labs    04/02/24 1038 04/03/24 0713 04/04/24 0643  HGB  --  12.7*  --   HCT  --  39.7  --   PLT  --  255  --   LABPROT 29.1* 33.4* 32.3*  INR 2.7* 3.3* 3.1*    Estimated Creatinine Clearance: 94.7 mL/min (by C-G formula based on SCr of 0.92 mg/dL).  Assessment: 85 YOM with history of central venous sinus thrombosis on Eliquis  PTA. He returned with new onset of R-sided weakness and cerebral edema.  Patient was initially on IV heparin , then transitioned back to PTA Eliquis .  Pharmacy intially consulted to transition patient to IV heparin  bridge to Warfarin starting 5/3. IV heparin  stopped on 5/6 with therapeutic INR and currently on Warfarin only.   New INR goal of 2.5 to 3.5 per Dr. Christiane Cowing (Neuro) due to new stroke.  5/24 AM: INR remains therapeutic at 3.1, Hgb 12.7 stable, ptlc wnl stable on 5/24.  No bleeding reported.   RN on 5/22 previously reported that patient does not like hospital food and has been having family bringing in meals from home.   On 5/23 charted 1 meal 100% eaten.  5/24  ate 15% of lunch and 0% of breakfast.  Nothing charted in [facility] 5/25.    Based on INR results and warfarin doses over last 8 days, will try 2.5mg  daily as potential maintenance dose.     Goal of Therapy:  INR goal 2.5 to 3.5 Monitor platelets by anticoagulation protocol: Yes   Plan:  Warfarin 3mg  daily  Monitor INR every other day, next due 5/27.   Monitor for signs/symptoms of bleeding.  Alisa Irish, RPh Clinical Pharmacist 04/04/2024 12:59 PM

## 2024-04-05 MED ORDER — LIDOCAINE HCL 1 % IJ SOLN
10.0000 mL | Freq: Once | INTRAMUSCULAR | Status: AC
  Administered 2024-04-05: 10 mL
  Filled 2024-04-05: qty 10

## 2024-04-05 MED ORDER — ADULT MULTIVITAMIN W/MINERALS CH: 1.0000 | ORAL_TABLET | Freq: Every day | ORAL | Status: DC

## 2024-04-05 MED ORDER — SORBITOL 70 % SOLN
30.0000 mL | Freq: Once | Status: DC
  Filled 2024-04-05: qty 30

## 2024-04-05 MED ORDER — POLYETHYLENE GLYCOL 3350 17 G PO PACK: 17.0000 g | PACK | Freq: Every day | ORAL | Status: DC | PRN

## 2024-04-05 NOTE — Progress Notes (Signed)
 Patient ID: Samuel Warren, male   DOB: [birth year], 33 y.o.   MRN: [id number]  Have ordered hospital bed for home for ease of transfers and movement. Brother aware of co-pay. Here for education today.

## 2024-04-05 NOTE — Progress Notes (Signed)
 PROGRESS NOTE   Subjective/Complaints: Pt reports bad HA and neck pain again this AM.  ASL and Jardai interpretor in room- used them to d/w family and pt to explain trigger point injections- pt and family agreed to procedure.    ROS: Limited by deafness and language barrier Objective:   No results found.   Recent Labs    04/03/24 0713  WBC 4.5  HGB 12.7*  HCT 39.7  PLT 255    No results for input(s): "NA", "K", "CL", "CO2", "GLUCOSE", "BUN", "CREATININE", "CALCIUM " in the last 72 hours.    Intake/Output Summary (Last 24 hours) at 04/05/2024 0942 Last data filed at 04/05/2024 0900 Gross per 24 hour  Intake 240 ml  Output 200 ml  Net 40 ml           Physical Exam: Vital Signs Blood pressure 107/83, pulse 69, temperature (!) 97.4 F (36.3 C), temperature source Oral, resp. rate 18, height 5\' 4"  (1.626 m), weight 61.3 kg, SpO2 100%.   General: awake, alert, deaf; initially supine- then seen sitting up in w/c;  NAD HENT: conjugate gaze; oropharynx dry- torticollis forming- seen tilted chin to R; head to L CV: regular rate and rhythm; no JVD Pulmonary: CTA B/L; no W/R/R- good air movement GI: soft, NT, ND, (+)BS Psychiatric: appropriate-flat Neurological: Ox3  MSK- splenius capitus, scalenes- post auricular; and levators and upper traps tight-  Musculoskeletal:        General: Normal range of motion.     Cervical back: Neck supple. No tenderness.     Comments: Moving all 4 extremities in bed Skin:    General: Skin is warm and dry.     Comments:  IV out of neck is gone- wounds healing  Neurological:     Mental Status: He is alert.     Comments: [name] makes eye contact with examiner.  Exam limited by language barrier.  Follows simple demonstrated commands.  No facial droop. Mild ataxia Mild R inattention Mild ot moderate searching pattern on B/L finger to nose, stable 5/25  Neuro exam  appears consistent with prior but difficult to test due to communication   Assessment/Plan: 1. Functional deficits which require 3+ hours per day of interdisciplinary therapy in a comprehensive inpatient rehab setting. Physiatrist is providing close team supervision and 24 hour management of active medical problems listed below. Physiatrist and rehab team continue to assess barriers to discharge/monitor patient progress toward functional and medical goals  Care Tool:  Bathing    Body parts bathed by patient: Chest, Abdomen, Front perineal area, Buttocks, Right upper leg, Left upper leg, Right lower leg, Left lower leg, Face, Right arm, Left arm         Bathing assist Assist Level: Moderate Assistance - Patient 50 - 74%     Upper Body Dressing/Undressing Upper body dressing   What is the patient wearing?: Pull over shirt    Upper body assist Assist Level: Moderate Assistance - Patient 50 - 74%    Lower Body Dressing/Undressing Lower body dressing      What is the patient wearing?: Pants     Lower body assist Assist for lower body dressing:  Maximal Assistance - Patient 25 - 49%     Toileting Toileting    Toileting assist Assist for toileting: 2 Helpers     Transfers Chair/bed transfer  Transfers assist     Chair/bed transfer assist level: Maximal Assistance - Patient 25 - 49%     Locomotion Ambulation   Ambulation assist      Assist level: Maximal Assistance - Patient 25 - 49% Assistive device: Hand held assist Max distance: 25   Walk 10 feet activity   Assist     Assist level: Maximal Assistance - Patient 25 - 49% Assistive device: Hand held assist   Walk 50 feet activity   Assist Walk 50 feet with 2 turns activity did not occur: Safety/medical concerns  Assist level: Moderate Assistance - Patient - 50 - 74% Assistive device: Hand held assist    Walk 150 feet activity   Assist Walk 150 feet activity did not occur: Safety/medical  concerns         Walk 10 feet on uneven surface  activity   Assist Walk 10 feet on uneven surfaces activity did not occur: Safety/medical concerns         Wheelchair     Assist Is the patient using a wheelchair?: Yes Type of Wheelchair: Manual    Wheelchair assist level: Minimal Assistance - Patient > 75% Max wheelchair distance: 25    Wheelchair 50 feet with 2 turns activity    Assist        Assist Level: Moderate Assistance - Patient 50 - 74%   Wheelchair 150 feet activity     Assist      Assist Level: Maximal Assistance - Patient 25 - 49%   Blood pressure 107/83, pulse 69, temperature (!) 97.4 F (36.3 C), temperature source Oral, resp. rate 18, height 5\' 4"  (1.626 m), weight 61.3 kg, SpO2 100%.  Medical Problem List and Plan: 1. 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  Antithrombotics: -DVT/anticoagulation:  Pharmaceutical: Coumadin  5/9- Last INR 2.3- managed by pharmacy 5/13- INR 2.1 5/15- INR 2.5 5/16- new goal INR 2.5 to 3.5 since had new stroke per Neuro 5/19- INR 3.0 this AM 5/20- INR a little high at 3.7 5/21- INR 3.2 5/24 INR reviewed and is at goal at 3.3 5/26- INR 3.1 yesterday 3. Pain Management: Fioricet  as needed for refractory headaches, Tylenol  as needed  5/9- having HA's- they attempted Topamax - might need to try Elavil - if doesn't improve by tomorrow, will add Elavil  25 mg at bedtime  5/10- started Elavil  25 mg at bedtime- and lidoderm   patches- 2 patches 8pm to 8am- increased Fioricet  to q4 hours prn- cannot have Topamax  due to low Bicarb levels  5/11- HA somewhat better this AM per son by phone- pt continues to point at 10/10- but muscles in neck much looser  5/13- HA 5/10  this AM- might benefit from TrP Injections but hard to assess consent?  5/14- will d/w family and pt tomorrow  5/15- pt's Brother- told me had severe HA yesterday- Fioricet  taken off by Neurology but they added/increased Depakote  for HA control and possible seizures coverage-   5/16- increased Elavil  to 50 mg at bedtime for HA's- has tylenol  as needed otherwise- cannot do Trp injections since INR is going too high  5/18 discussed with neurology, do not suspect related to intracranial hypotension-no plan for epidural patch at this time.  Will start Zanaflex .  5/19- HA down to 5/10 this AM- if INR isn't higher, might try just upper trap Trp injections tomorrow?  5/22- HA 5/10 per pt- no ear drainage this AM   5/23- still having HA's per team, but so sleepy this Am, cannot really increase Elavil  dosage- and Neuro was trying to titrate his Dpeakote, but he had too many side effects- cannot do B blockers due to low BP nor Calcium  channel blockers- will need Neuro to manage HA's after d/c.   5/24: kpad added for neck pain  5/26- Did trigger point injections today.  4. Mood/Behavior/Sleep: Provide emotional support             -antipsychotic agents: N/A  5. Neuropsych/cognition: This patient is? capable of making decisions on his own behalf.  6. Skin/Wound Care: Routine skin checks  7. Fluids/Electrolytes/Nutrition: Routine in and outs with follow-up chemistries  8.  Cerebral edema.  Hypertonic saline discontinued  9.  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  Status post IR for thrombectomy was discharged on Eliquis - now changed to Coumadin , conitnue  10.  History of stroke/recent viral meningitis.  Completed course of Doxy 11.  Refractory headaches.   Medrol  Dosepak.  Topamax  discontinued due to low bicarb- might need to start Elavil , since HA's still 6/10  5/9- still having constant HA's  5/10- pain up to 10/10 today- got head CT due to 10/10 HA- looks OK- no changes- reexamined pt's neck- very tight musculature- will order Robaxin  750 mg q6 hours prn for neck/posterior HA's- as well as lidocaine  patches nightly- added Elavil  25 mg at bedtime for prevention- and asked therapy to do myofascial release for pt.   5/11- muscles looser- HA appears to be less- appears more comfortable  5/12 not using robaxin  frequently, will schedule AM dose. family indicates headaches/neck pain overall improving  5/13- HA down to 5/10 this AM  5/16- Fioricet  stopped by Neuro- due to risk of rebound HA's- added Elavil  and then increased to 50 mg at bedtime today.   -cannot do Trp Injections since his INR is going to be 2.5 to 3.5- and that's too high for me to feel comfortable doing trp injections  5/17 neurology following, decreased depakote , encouraged hydration   5/18 discussed with neurology, Zanaflex  was started, consider botox injections for HA  5/20- Per therapy, HA's getting worse and participating with therapy less- will increase Elavil  to 75 mg at bedtime- and verify using lidoderm  patches. Cannot do B blocker or Ca channel blocker due to low BP. Cannot do Trp injections due to INR 3.7  5/22- Will see if family comes here- might be able ot try Trp injections for HA today if INR 2.7- but only today  5/23- will monitor INR and see if next Tuesday can try to do Trp injections  5/26- did trigger point injections today 12.  Hyperlipidemia.  Continue Lipitor   13.  Congenital Deafness.  Patient does use  some sign language- but mainly gestures per ASL interpretor 14.  GERD.  Continue Protonix    15. Abd pain/Constipation  5/9- pt clear having constant abd pain- will get CT of abd/pelvis with contrast to assess abd pain   5/10- Abd CT looks good  -LBM 3 days ago-  ordered Sorbitol  for constipation  5/11- LBM yesterday  5/12 LBM documented yesterday  5/13- LBM 2 days ago- if no BM by tomorrow, will intervene  5/15- LBM yesterday-so abd pain is better  5/21- LBM yesterday  5/26- LBM 4 days ago- will give Sorbitol  after therapy 16. Anemia. Mild. -5/12 HGB 12.3, stable continue to monitor  17. Code stroke  5/14- Was (-)- but being assessed for seizures.  5/15- still has mild R sided weakness-  EEG showed cortical dysfunction on L side- but no seizures seen- but MRI looked stable- will wait for Neuro note today- had another episode yesterday it appears based on nursing notes, that was weaker standing/sitting than laying down-it appears that this occurs almost every time up with therapy- much weaker than when laying down. 5/16- Neuro spoke with radiology and they said he did actually have a new small stroke that would explain weakness of R side   5/17 neuro following, appears Dr. Janett Medin may see him today 5/20-5/21 Strength still weaker on R side  18. Orthostatic hypotension  5/15- On Midodrine  5 mg TID and on IVF's- 90s systolic- laying down on chart- he's not dry based on BUN/Cr ratio- Off IVF's but if BP stays low, will restart IVF's.  - will increase Midodrine  to 7.5 mg TID  5/16- will d/w therapy and see how it's doing for him to see if need to increase midodrine  again.   5/18 BP stable, monitor how he does with therapy tomorrow  5/19- changed to 15/7  5/20- BP still low 100's systolic- if drops again, will need to maximize Midodrine  at 10 mg TID.   Diastolic BP mildly elevated, continue to monitor TID  19. milky discharge from his L ear and some clear discharge on his ear noted by nursing. Unable to see TM but does have thick milky looking discharge L ear.  Will start augmentin  and ofloxacin  drops for L ear. May be affecting balance, consider ENT outpatient f/u.  5/19- HA somewhat better this AM? Maybe this was contributing?  5/20- per staff, HA is  worse- per pt, it's better- it's probably better when he wakes up, but gets worse when attempts to get up- has been refusing therapy  5/21- pt denied any HA this AM- or nausea- but woke him up to ask- was sleepy. Was c/o ear pain.  If ear pain doesn't improve, will call ENT  5/22- no ear drainage this AM   5/23- If dizziness continues after increasing Midodrine , then I strongly suggest calling ENT over weekend.   5/24: denies dizziness  20. Torticollis: discussed that he may benefit from outpatient Botox  Patient here for trigger point injections for  Consent done and on chart.  Cleaned areas with alcohol and injected using a 27 gauge 1.5 inch needle  Injected 3cc- none wasted Using 1% Lidocaine  with no EPI  Upper traps- B/L  Levators- R only Posterior scalenes Middle scalenes- 2 on R and 1 on left Splenius Capitus- B/L  Pectoralis Major Rhomboids Infraspinatus Teres Major/minor Thoracic paraspinals Lumbar paraspinals Other injections-    Patient's level of pain prior was 8/10 Current level of pain after injections is 5/10  There was no bleeding  or complications.  Patient was advised to drink a lot of water on day after injections to flush system Will have increased soreness for 12-48 hours after injections.  Can use Lidocaine  patches the day AFTER injections Can use theracane on day of injections in places didn't inject Can use heating pad 4-6 hours AFTER injections   I spent a total of 53   minutes on total care today- >50% coordination of care- due to  Pt seen 2x, did trp injections- also spoke with nursing multiple times and with therapy about plan- as well as SW LOS: 18 days A FACE TO FACE EVALUATION WAS PERFORMED  Camellia Popescu 04/05/2024, 9:42 AM

## 2024-04-05 NOTE — Progress Notes (Signed)
 Occupational Therapy Session Note  Patient Details  Name: Samuel Warren MRN: [id number] Date of Birth: October 04, 1991  Today's Date: 04/05/2024 OT Individual Time: 1000-1105 OT Individual Time Calculation (min): 65 min    Short Term Goals: Week 3:  OT Short Term Goal 1 (Week 3): Patient will complete toileting tasks with mod A OT Short Term Goal 2 (Week 3): Patient will maintain static sitting balance with min A OT Short Term Goal 3 (Week 3): Patient will complete LB dressing with mod A  Skilled Therapeutic Interventions/Progress Updates:      Therapy Documentation Precautions:  Precautions Precautions: Fall, Other (comment) Recall of Precautions/Restrictions: Intact Precaution/Restrictions Comments: R inattention Restrictions Weight Bearing Restrictions Per Provider Order: No General: family ed  Pt seated in W/C upon OT arrival, agreeable to OT. Mother, father and wife present for session.  Pain:  unrated pain reported in Rt side of neck and overall head, activity, intermittent rest breaks, distractions provided for pain management, pt reports tolerable to proceed.   Other Treatments: Patient, pt care partners, and Abraham Hoffmann OT present at family education. Pt and care partners educated on functional level of assistance for ADLs and functional mobility/transfers. Pt educated specifically on CARE tool levels of assistance provided at current status and level of assistance for OT goals once at D/C and completing bed level ADLs for safety. OT discussing through interpreters need of custom W/C (W/C eval happening tomorrow 5/27) for increased functional mobility and safety d/t increased weakness. OT also discussing need for hospital bed in order for increased positioning. OT training family on use of hoyer lift for pt transfers at home and purpose of using hoyer vs squat/stand pivot d/t safety concerns. Mother and father active participants in learning how to use and put hoyer lift pad under pt while  in bed. OT educating how to place pad and hook it up to hoyer. OT also providing information for KT tape, how to apply and purpose, d/t pt reporting it helping with pain. It is recommended care partners to complete more family training in order to provide increase understanding of level of assistance necessary once D/C. No questions/comments/concerns from family after training.   Pt supine in bed with bed alarm activated, 2 bed rails up, call light within reach and 4Ps assessed.   Therapy/Group: Individual Therapy  Nila Barth, OTD, OTR/L 04/05/2024, 12:30 PM

## 2024-04-05 NOTE — Progress Notes (Signed)
 Physical Therapy Session Note  Patient Details  Name: Samuel Warren MRN: [id number] Date of Birth: 03-Jul-1991  Today's Date: 04/05/2024 PT Individual Time: 0800-0900 and 1300-1330 PT Individual Time Calculation (min): 60 min and 30 min.  Short Term Goals: Week 3:  PT Short Term Goal 1 (Week 3): Pt will transfer sup to sit w/ min A consistently PT Short Term Goal 1 - Progress (Week 3): Partly met PT Short Term Goal 2 (Week 3): Pt will sit EOB w/ min A PT Short Term Goal 2 - Progress (Week 3): Partly met PT Short Term Goal 3 (Week 3): Pt will transfer sit to stand w/ mod A PT Short Term Goal 3 - Progress (Week 3): Revised due to lack of progress PT Short Term Goal 4 (Week 3): Pt will amb w/ LRAD and mod A x 50' PT Short Term Goal 5 - Progress (Week 3): Discontinued (comment) (pt goal D/C'd 2/2 lack of progress and for safety)  Skilled Therapeutic Interventions/Progress Updates:   First session:  Pt presents supine in bed and available for family training.  Pts mother only one present for ed.  Phone call made and will be coming in.  Pt required mod A for rolling and cues for reaching for siderails.  Scrub pants donned in supine and w/ rolling.  Pt required heavy mod A for sidelying to sit.  Education given to mother for transfer sup to sit, but unable to participate w/ hands-on ed.  Pt sitting w/ flexed posture and manual facilitation for upright balance.  Pt assisted to supine 2/2 pain.  Father and spouse arrived but still unable to perform hands-on for transfers 2/2 safety.  Pt transferred sup to sit w/ mod A and then squat pivot bed > w/c  w/ max/mod A.  Pt left sitting in TIS w/ seat belt and chair alarm on and all needs in reach, reclined back.  Family remains in room.  Discussed situation w/ OT and SW.  Second session:  Pt presents supine in bed and states through interpreter the need to have BM.  Pt transfers sidelying to sit w/ max A and cues for siderail use.  Pt sitting w/ flexed trunk,  cueing needed for upright posture to receive ASL translation.  Max A for SPT bed > w/c.  Pt wheeled into BR and transferred w/ max A and +2 for clothing management.  Pt continent of bladder in toilet and unable to have BM.  Pt returned to bed w/ max A.  Bed alarm on and all needs in reach.     Therapy Documentation Precautions:  Precautions Precautions: Fall, Other (comment) Recall of Precautions/Restrictions: Intact Precaution/Restrictions Comments: R inattention Restrictions Weight Bearing Restrictions Per Provider Order: No General:   Vital Signs:   Pain: c/o pain to neck and posterior head, but unable to rate.      Therapy/Group: Individual Therapy  Cleatus Goodin P Emersyn Wyss 04/05/2024, 12:03 PM

## 2024-04-06 ENCOUNTER — Other Ambulatory Visit (HOSPITAL_COMMUNITY): Payer: Self-pay

## 2024-04-06 ENCOUNTER — Telehealth (HOSPITAL_COMMUNITY): Payer: Self-pay | Admitting: Pharmacy Technician

## 2024-04-06 LAB — PROTIME-INR
INR: 2.6 — ABNORMAL HIGH (ref 0.8–1.2)
Prothrombin Time: 27.8 s — ABNORMAL HIGH (ref 11.4–15.2)

## 2024-04-06 MED ORDER — METHOCARBAMOL 750 MG PO TABS
750.0000 mg | ORAL_TABLET | Freq: Four times a day (QID) | ORAL | 0 refills | Status: DC | PRN
  Filled 2024-04-06: qty 60, 15d supply, fill #0

## 2024-04-06 MED ORDER — TIZANIDINE HCL 4 MG PO TABS
4.0000 mg | ORAL_TABLET | Freq: Every day | ORAL | 0 refills | Status: DC
  Filled 2024-04-06: qty 30, 30d supply, fill #0

## 2024-04-06 MED ORDER — ATORVASTATIN CALCIUM 80 MG PO TABS
80.0000 mg | ORAL_TABLET | Freq: Every day | ORAL | 0 refills | Status: DC
  Filled 2024-04-06: qty 30, 30d supply, fill #0

## 2024-04-06 MED ORDER — DIVALPROEX SODIUM 250 MG PO DR TAB
250.0000 mg | DELAYED_RELEASE_TABLET | Freq: Two times a day (BID) | ORAL | 0 refills | Status: DC
  Filled 2024-04-06: qty 60, 30d supply, fill #0

## 2024-04-06 MED ORDER — MIDODRINE HCL 5 MG PO TABS
5.0000 mg | ORAL_TABLET | Freq: Three times a day (TID) | ORAL | 0 refills | Status: DC
  Filled 2024-04-06: qty 90, 30d supply, fill #0

## 2024-04-06 MED ORDER — PANTOPRAZOLE SODIUM 40 MG PO TBEC
40.0000 mg | DELAYED_RELEASE_TABLET | Freq: Every day | ORAL | 0 refills | Status: DC
  Filled 2024-04-06: qty 30, 30d supply, fill #0

## 2024-04-06 MED ORDER — LIDOCAINE 5 % EX PTCH
2.0000 | MEDICATED_PATCH | CUTANEOUS | 0 refills | Status: DC
Start: 2024-04-06 — End: 2024-04-22
  Filled 2024-04-06: qty 30, 15d supply, fill #0

## 2024-04-06 MED ORDER — AMITRIPTYLINE HCL 25 MG PO TABS
50.0000 mg | ORAL_TABLET | Freq: Every day | ORAL | 0 refills | Status: DC
Start: 2024-04-06 — End: 2024-04-22
  Filled 2024-04-06: qty 60, 30d supply, fill #0

## 2024-04-06 NOTE — Progress Notes (Signed)
 Physical Therapy Session Note  Patient Details  Name: Samuel Warren MRN: [id number] Date of Birth: [birth year]  Today's Date: 04/06/2024 PT Individual Time: 0800-0910 PT Individual Time Calculation (min): 70 min   Short Term Goals: Week 2:  PT Short Term Goal 1 (Week 2): Pt will transfer sup to sit w/ min A consistently PT Short Term Goal 2 (Week 2): Pt will improve sitting balance w/ min A PT Short Term Goal 3 (Week 2): Pt will transfer sit to stand w/ min A consistently. PT Short Term Goal 4 (Week 2): Pt will amb w/ mod A w/ LRAD x 50' Week 3:  PT Short Term Goal 1 (Week 3): Pt will transfer sup to sit w/ min A consistently PT Short Term Goal 1 - Progress (Week 3): Partly met PT Short Term Goal 2 (Week 3): Pt will sit EOB w/ min A PT Short Term Goal 2 - Progress (Week 3): Partly met PT Short Term Goal 3 (Week 3): Pt will transfer sit to stand w/ mod A PT Short Term Goal 3 - Progress (Week 3): Revised due to lack of progress PT Short Term Goal 4 (Week 3): Pt will amb w/ LRAD and mod A x 50' PT Short Term Goal 5 - Progress (Week 3): Discontinued (comment) (pt goal D/C'd 2/2 lack of progress and for safety)  Skilled Therapeutic Interventions/Progress Updates:   Received pt semi-reclined in bed with wife at bedside. Pt indicating pain in RUE, L upper trapezius, and posterior/anterior head - per RN pt up to date on all pain medication. Session with emphasis on functional mobility/transfers, generalized strengthening and endurance, dynamic standing balance/coordination, and NMR. Pt initially declined OOB mobility due to pain but with encouragement agreed to OOB mobility. Pt rolled R with max A and transferred R sidelying<>sitting EOB with HOB elevated and max A for trunk control and BLE management. Required max A to scoot to [facility] and mod A for static sitting balance - noted posterior and L lateral lean with severe forward head. Pt required max/total multimodal cues for sequencing but ultimately  unable to follow simple one step commands.  Attempted to stand in [place], but pt unable despite max/total A of 1. Retrieved NT and pt stood in [place] with +2 assist and transferred to TIS WC in [place] dependently. Pt unable to hold head upright, resting on [place] bar. Stood from [employer] flaps with +2 assist and lwoered into TIS WC. Increased time spent attempting to make pt comfortable in TIS WC. Pillows placed on L side as pt inially hanging off L side of TIS WC with no awareness.   Pt transported to/from room in [facility] dependently. Placed full lap tray onto TIS to support BUE and for edema management. Pt performed seated BLE strengthening on Kinetron at 90cm/sec for ~1 minute with max A to manage RLE and complete ROM. Pt able to use LLE to push ~50% of the time with max verbal and tactile cues to "push". Returned to room and concluded session with pt reclined in TIS WC, needs within reach, and seatbelt alarm on.   Therapy Documentation Precautions:  Precautions Precautions: Fall, Other (comment) Recall of Precautions/Restrictions: Intact Precaution/Restrictions Comments: R inattention Restrictions Weight Bearing Restrictions Per Provider Order: No  Therapy/Group: Individual Therapy [name] Nena Bank PT, DPT 04/06/2024, 7:09 AM

## 2024-04-06 NOTE — Progress Notes (Signed)
 Occupational Therapy Session Note  Patient Details  Name: Samuel Warren MRN: [id number] Date of Birth: 12/31/1990  Today's Date: 04/06/2024 OT Individual Time: 1000-1100 OT Individual Time Calculation (min): 60 min    Short Term Goals: Week 3:  OT Short Term Goal 1 (Week 3): Patient will complete toileting tasks with mod A OT Short Term Goal 2 (Week 3): Patient will maintain static sitting balance with min A OT Short Term Goal 3 (Week 3): Patient will complete LB dressing with mod A  Skilled Therapeutic Interventions/Progress Updates:      Therapy Documentation Precautions:  Precautions Precautions: Fall, Other (comment) Recall of Precautions/Restrictions: Intact Precaution/Restrictions Comments: R hemiparesis with inattention Restrictions Weight Bearing Restrictions Per Provider Order: No General:  Pt seated in W/C upon OT arrival, agreeable to OT. Interpreter present for session  Pain: unrated pain reported in head and neck, positioning, intermittent rest breaks, distractions provided for pain management, pt reports tolerable to proceed.   Other Treatments: Pt, Gayl Katos, and Abraham Hoffmann OT present for custom W/C evaluation in order to increase functional mobility in order to complete ADLs and community integration. Pt will be receiving custom tilt in space with lateral supports and large head rest for positioning purposes. Pt will also be receiving ROHO cushion for prevention of skin breakdown. W/C will also have seat belt and chest strap to compensate for poor trunk control/support. Pt will also have padded foot plates for prevention of skin breakdown on heels. Pt will also be provided with full lap tray for transportation of items, arm/shoulder positioning and cueing for upright posture.    Pt seated in W/C at end of session with W/C alarm donned, call light within reach and 4Ps assessed.    Therapy/Group: Individual Therapy  Nila Barth, OTD, OTR/L 04/06/2024, 4:46 PM

## 2024-04-06 NOTE — Telephone Encounter (Signed)
 Pharmacy Patient Advocate Encounter  Received notification from [employer] that Prior Authorization for Lidocaine  5% patch has been APPROVED from 04/06/2024 to 04/06/2025. Ran test claim, Copay is $0.00. This test claim was processed through [facility]- copay amounts may vary at other pharmacies due to pharmacy/plan contracts, or as the patient moves through the different stages of their insurance plan.   PA #/Case ID/Reference #: [id number]

## 2024-04-06 NOTE — Progress Notes (Signed)
 Inpatient Rehabilitation Discharge Medication Review by a Pharmacist  A complete drug regimen review was completed for this patient to identify any potential clinically significant medication issues.  High Risk Drug Classes Is patient taking? Indication by Medication  Antipsychotic No   Anticoagulant Yes Warfarin - CVA (INR goal 2.5-3.5)  Antibiotic No   Opioid No   Antiplatelet No   Hypoglycemics/insulin No   Vasoactive Medication Yes Midodrine  - low BP  Chemotherapy No   Other Yes Amitriptyline  - sleep Lidocaine  - pain Methocarbamol  prn spasms Tizanidine  - muscle spasms Divalproex  - headache, seizure ppx Pantoprazole  - Reflux Atorvastatin  - HLD     Type of Medication Issue Identified Description of Issue Recommendation(s)  Drug Interaction(s) (clinically significant)     Duplicate Therapy     Allergy     No Medication Administration End Date     Incorrect Dose     Additional Drug Therapy Needed     Significant med changes from prior encounter (inform family/care partners about these prior to discharge).    Other       Clinically significant medication issues were identified that warrant physician communication and completion of prescribed/recommended actions by midnight of the next day:  No  Name of provider notified for urgent issues identified:   Provider Method of Notification:     Pharmacist comments: None  Time spent performing this drug regimen review (minutes):  20 minutes  Thank you. Lennice Quivers, PharmD

## 2024-04-06 NOTE — Progress Notes (Signed)
 Physical Therapy Discharge Summary  Patient Details  Name: Samuel Warren MRN: [id number] Date of Birth: 08-31-1991  Date of Discharge from PT service:Apr 06, 2024  Patient has met 0 of 11 long term goals. Pt experienced Patient to discharge at a wheelchair level Total Assist.  Patient's care partner requires assistance to provide the necessary physical and cognitive assistance at discharge. Pt's family attended OT family education training with emphasis on Alen Amy transfers but did not participate in hands on training with PT. Pt's mother and father participated in hands on OT education but wife did not.  Reasons goals not met: Goals not met due to R hemiparesis with R inattention, global weakness/deconditioning, impaired motor planning/sequencing, decreased sitting/standing balance/coordination, decreased initiation/awareness, and pain in head/neck.  Recommendation:  Patient will benefit from ongoing skilled PT services in home health setting to continue to advance safe functional mobility, address ongoing impairments in transfers, generalized strengthening and endurance, dynamic standing balance/coordination, NMR, gait training and to minimize fall risk.  Equipment: Alen Amy lift, hospital bed, custom wheelchair  Reasons for discharge: change in medical status, lack of progress toward goals, and discharge from hospital  Patient/family agrees with progress made and goals achieved: Yes  PT Discharge Precautions/Restrictions Precautions Precautions: Fall;Other (comment) Precaution/Restrictions Comments: R hemiparesis with inattention Restrictions Weight Bearing Restrictions Per Provider Order: No Pain Interference Pain Interference Pain Effect on Sleep: 8. Unable to answer Pain Interference with Therapy Activities: 8. Unable to answer Pain Interference with Day-to-Day Activities: 8. Unable to answer Cognition Overall Cognitive Status: Difficult to assess Arousal/Alertness:  Awake/alert Orientation Level: Other (comment) (difficult to determine) Memory: Impaired Awareness: Impaired Problem Solving: Impaired Safety/Judgment: Impaired Sensation Sensation Light Touch: Not tested Hot/Cold: Not tested Proprioception: Not tested Stereognosis: Not tested Additional Comments: difficult to assess due to congitive impairments, difficulty following commands, and deaf Coordination Gross Motor Movements are Fluid and Coordinated: No Fine Motor Movements are Fluid and Coordinated: No Coordination and Movement Description: R hemiparesis with R inattention, pain, decreased balance/coordination, impaired motor planning/sequencing Motor  Motor Motor: Hemiplegia Motor - Skilled Clinical Observations: R hemiplegia  Mobility Bed Mobility Bed Mobility: Rolling Right;Rolling Left;Supine to Sit;Sit to Supine Rolling Right: Moderate Assistance - Patient 50-74% Rolling Left: Moderate Assistance - Patient 50-74% Supine to Sit: Moderate Assistance - Patient 50-74% Sit to Supine: Moderate Assistance - Patient 50-74% Transfers Transfers: Sit to Stand;Stand to Sit;Stand Pivot Transfers;Squat Pivot Transfers Sit to Stand: Maximal Assistance - Patient 25-49% Stand to Sit: Maximal Assistance - Patient 25-49% Stand Pivot Transfers: Maximal Assistance - Patient 25 - 49% Stand Pivot Transfer Details: Verbal cues for sequencing;Verbal cues for technique;Other (comment);Verbal cues for precautions/safety Stand Pivot Transfer Details (indicate cue type and reason): verbal/tactile/visual cues for sequencing but poor carry over Squat Pivot Transfers: Maximal Assistance - Patient 25-49% Transfer (Assistive device): None Locomotion  Gait Ambulation: No Gait Gait: No Stairs / Additional Locomotion Stairs: No Pick up small object from the floor assist level: Dependent - Patient 0% Wheelchair Mobility Wheelchair Mobility: Yes (TIS WC) Wheelchair Assistance: Dependent - Patient  0% Wheelchair Parts Management: Needs assistance Distance: >127ft  Trunk/Postural Assessment  Cervical Assessment Cervical Assessment: Exceptions to [facility] (severe flexion due to pain) Thoracic Assessment Thoracic Assessment: Exceptions to [facility] (kyphosis) Lumbar Assessment Lumbar Assessment: Exceptions to [facility] (posterior pelvic tilt) Postural Control Postural Control: Deficits on evaluation Head Control: poor Trunk Control: posterior and L lateran lean Righting Reactions: delayed/absent Protective Responses: delayed/absent  Balance Balance Balance Assessed: Yes Static Sitting Balance Static Sitting - Balance Support: Feet  supported;Bilateral upper extremity supported Static Sitting - Level of Assistance: 3: Mod assist Dynamic Sitting Balance Dynamic Sitting - Balance Support: Feet supported;No upper extremity supported Dynamic Sitting - Level of Assistance: 3: Mod assist Sitting balance - Comments: posterior and L lateral lean with severe forward head Static Standing Balance Static Standing - Balance Support: No upper extremity supported Static Standing - Level of Assistance: 2: Max assist Dynamic Standing Balance Dynamic Standing - Balance Support: No upper extremity supported Dynamic Standing - Level of Assistance: 2: Max assist Extremity Assessment  RLE Assessment RLE Assessment: Not tested General Strength Comments: not formally tested due to cognition, impaired sequencing/motor planning, and language barrier LLE Assessment LLE Assessment: Exceptions to [facility] General Strength Comments: not formally tested due to cognition, impaired sequencing/motor planning, and language barrier   Nekesha Font M Zaunegger Duey Liller Zaunegger PT, DPT 04/06/2024, 9:55 AM

## 2024-04-06 NOTE — Plan of Care (Signed)
  Problem: Consults Goal: RH STROKE PATIENT EDUCATION Description: See Patient Education module for education specifics  Outcome: Progressing   Problem: RH BOWEL ELIMINATION Goal: RH STG MANAGE BOWEL WITH ASSISTANCE Description: STG Manage Bowel with  minimal Assistance. Outcome: Progressing   Problem: RH BLADDER ELIMINATION Goal: RH STG MANAGE BLADDER WITH ASSISTANCE Description: STG Manage Bladder With  minimal Assistance Outcome: Progressing   Problem: RH SKIN INTEGRITY Goal: RH STG SKIN FREE OF INFECTION/BREAKDOWN Description: Manage skin free of infection/ breakdown with minimal assistance Outcome: Progressing   Problem: RH SAFETY Goal: RH STG ADHERE TO SAFETY PRECAUTIONS W/ASSISTANCE/DEVICE Description: STG Adhere to Safety Precautions With minimal  Assistance/Device. Outcome: Progressing   Problem: RH KNOWLEDGE DEFICIT Goal: RH STG INCREASE KNOWLEDGE OF STROKE PROPHYLAXIS Description: Manage increase knowledge of stroke prohylaxis with minimal assistance from spouse and family members using educational materials provided Outcome: Progressing   Problem: RH KNOWLEDGE DEFICIT Goal: RH STG INCREASE KNOWLEGDE OF HYPERLIPIDEMIA Description: Manage increase knowledge of hyperlipidemia with minimal assistance from spouse and family members using educational materials provided Outcome: Progressing

## 2024-04-06 NOTE — Progress Notes (Signed)
 PHARMACY - ANTICOAGULATION CONSULT NOTE  Pharmacy Consult for Warfarin Indication:  central venous sinus thrombosis  No Known Allergies  Patient Measurements: Height: 5\' 4"  (162.6 cm) Weight: 61.3 kg (135 lb 2.3 oz) IBW/kg (Calculated) : 59.2 HEPARIN  DW (KG): 65.3  Vital Signs: Temp: 97.9 F (36.6 C) (05/27 0509) Temp Source: Oral (05/27 0509) BP: 118/85 (05/27 0509) Pulse Rate: 68 (05/27 0509)  Labs: Recent Labs    04/04/24 0643 04/06/24 0453  LABPROT 32.3* 27.8*  INR 3.1* 2.6*    Estimated Creatinine Clearance: 94.7 mL/min (by C-G formula based on SCr of 0.92 mg/dL).  Assessment: 62 YOM with history of central venous sinus thrombosis on Eliquis  PTA. He returned with new onset of R-sided weakness and cerebral edema.  Patient was initially on IV heparin , then transitioned back to PTA Eliquis .  Pharmacy intially consulted to transition patient to IV heparin  bridge to Warfarin starting 5/3. IV heparin  stopped on 5/6 with therapeutic INR and currently on Warfarin only.   New INR goal of 2.5 to 3.5 per Dr. Christiane Cowing (Neuro) due to new stroke.  INR therapeutic with Warfarin 3 mg po daily -- continue at discharge with follow up INR Friday.   Goal of Therapy:  INR goal 2.5 to 3.5 Monitor platelets by anticoagulation protocol: Yes   Plan:  Warfarin 3mg  daily  Monitor INR every other day   Monitor for signs/symptoms of bleeding. Thank you. Lennice Quivers, PharmD 04/06/2024 8:07 AM

## 2024-04-06 NOTE — Progress Notes (Signed)
 PROGRESS NOTE   Subjective/Complaints:  Pt kept pointing to HA on communication board, however wouldn't tell me how bad the HA was- kept closing eyes purposefully- didn't want to wake up.     ROS: Limited by pt and deafness Objective:   No results found.   No results for input(s): "WBC", "HGB", "HCT", "PLT" in the last 72 hours.   No results for input(s): "NA", "K", "CL", "CO2", "GLUCOSE", "BUN", "CREATININE", "CALCIUM " in the last 72 hours.    Intake/Output Summary (Last 24 hours) at 04/06/2024 0949 Last data filed at 04/06/2024 0600 Gross per 24 hour  Intake 720 ml  Output --  Net 720 ml           Physical Exam: Vital Signs Blood pressure 118/85, pulse 68, temperature 97.9 F (36.6 C), temperature source Oral, resp. rate 17, height 5\' 4"  (1.626 m), weight 61.3 kg, SpO2 99%.    General: awake, alert, appropriate,  just woke up- kept closing eyes; supine in bed; NAD HENT: kept eyes closed; ; oropharynx moist CV: regular rate and rhythm; no JVD Pulmonary: CTA B/L; no W/R/R- good air movement GI: soft, NT, ND, (+)BS Psychiatric: kept closing eyes- was less communicative  Neurological: cannot discern cognition-    MSK- splenius capitus, scalenes- post auricular; and levators and upper traps tight- - no torticllis noted when laying in bed-  Musculoskeletal:        General: Normal range of motion.     Cervical back: Neck supple. No tenderness.     Comments: Moving all 4 extremities in bed Skin:    General: Skin is warm and dry.     Comments:  IV out of neck is gone- wounds healing  Neurological:     Mental Status: He is alert.     Comments: [name] makes eye contact with examiner.  Exam limited by language barrier.  Follows simple demonstrated commands.  No facial droop. Mild ataxia Mild R inattention Mild ot moderate searching pattern on B/L finger to nose, stable 5/25  Neuro exam appears  consistent with prior but difficult to test due to communication   Assessment/Plan: 1. Functional deficits which require 3+ hours per day of interdisciplinary therapy in a comprehensive inpatient rehab setting. Physiatrist is providing close team supervision and 24 hour management of active medical problems listed below. Physiatrist and rehab team continue to assess barriers to discharge/monitor patient progress toward functional and medical goals  Care Tool:  Bathing    Body parts bathed by patient: Chest, Abdomen, Front perineal area, Buttocks, Right upper leg, Left upper leg, Right lower leg, Left lower leg, Face, Right arm, Left arm         Bathing assist Assist Level: Moderate Assistance - Patient 50 - 74%     Upper Body Dressing/Undressing Upper body dressing   What is the patient wearing?: Pull over shirt    Upper body assist Assist Level: Moderate Assistance - Patient 50 - 74%    Lower Body Dressing/Undressing Lower body dressing      What is the patient wearing?: Pants     Lower body assist Assist for lower body dressing: Maximal Assistance - Patient 25 -  49%     Toileting Toileting    Toileting assist Assist for toileting: 2 Helpers     Transfers Chair/bed transfer  Transfers assist     Chair/bed transfer assist level: Maximal Assistance - Patient 25 - 49%     Locomotion Ambulation   Ambulation assist   Ambulation activity did not occur: Safety/medical concerns (hemiparesis, balance, impaired motor planning/sequencing)  Assist level: Maximal Assistance - Patient 25 - 49% Assistive device: Hand held assist Max distance: 25   Walk 10 feet activity   Assist  Walk 10 feet activity did not occur: Safety/medical concerns (hemiparesis, balance, impaired motor planning/sequencing)  Assist level: Maximal Assistance - Patient 25 - 49% Assistive device: Hand held assist   Walk 50 feet activity   Assist Walk 50 feet with 2 turns activity did not  occur: Safety/medical concerns (hemiparesis, balance, impaired motor planning/sequencing)  Assist level: Moderate Assistance - Patient - 50 - 74% Assistive device: Hand held assist    Walk 150 feet activity   Assist Walk 150 feet activity did not occur: Safety/medical concerns (hemiparesis, balance, impaired motor planning/sequencing)         Walk 10 feet on uneven surface  activity   Assist Walk 10 feet on uneven surfaces activity did not occur: Safety/medical concerns (hemiparesis, balance, impaired motor planning/sequencing)         Wheelchair     Assist Is the patient using a wheelchair?: Yes Type of Wheelchair:  (TIS WC)    Wheelchair assist level: Dependent - Patient 0% Max wheelchair distance: 128ft    Wheelchair 50 feet with 2 turns activity    Assist        Assist Level: Dependent - Patient 0%   Wheelchair 150 feet activity     Assist      Assist Level: Dependent - Patient 0%   Blood pressure 118/85, pulse 68, temperature 97.9 F (36.6 C), temperature source Oral, resp. rate 17, height 5\' 4"  (1.626 m), weight 61.3 kg, SpO2 99%.  Medical Problem List and Plan: 1. 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  Antithrombotics: -DVT/anticoagulation:  Pharmaceutical: Coumadin  5/9- Last INR 2.3- managed by pharmacy 5/13- INR 2.1 5/15- INR 2.5 5/16- new goal INR 2.5 to 3.5 since had new stroke per Neuro 5/19- INR 3.0 this AM 5/20- INR a little high  at 3.7 5/21- INR 3.2 5/24 INR reviewed and is at goal at 3.3 5/26- INR 3.1 yesterday 5/27- INR 2.6 3. Pain Management: Fioricet  as needed for refractory headaches, Tylenol  as needed  5/9- having HA's- they attempted Topamax - might need to try Elavil - if doesn't improve by tomorrow, will add Elavil  25 mg at bedtime  5/10- started Elavil  25 mg at bedtime- and lidoderm  patches- 2 patches 8pm to 8am- increased Fioricet  to q4 hours prn- cannot have Topamax  due to  low Bicarb levels  5/11- HA somewhat better this AM per son by phone- pt continues to point at 10/10- but muscles in neck much looser  5/13- HA 5/10 this AM- might benefit from TrP Injections but hard to assess consent?  5/14- will d/w family and pt tomorrow  5/15- pt's Brother- told me had severe HA yesterday- Fioricet  taken off by Neurology but they added/increased Depakote  for HA control and possible seizures coverage-   5/16- increased Elavil  to 50 mg at bedtime for HA's- has tylenol  as needed otherwise- cannot do Trp injections since INR is going too high  5/18 discussed with neurology, do not suspect related to intracranial hypotension-no plan for epidural patch at this time.  Will start Zanaflex .  5/19- HA down to 5/10 this AM- if INR isn't higher, might try just upper trap Trp injections tomorrow?  5/22- HA 5/10 per pt- no ear drainage this AM   5/23- still having HA's per team, but so sleepy this Am, cannot really increase Elavil  dosage- and Neuro was trying to titrate his Dpeakote, but he had too many side effects- cannot do B blockers due to low BP nor Calcium  channel blockers- will need Neuro to manage HA's after d/c.   5/24: kpad added for neck pain  5/26- Did trigger point injections today.   5/27- pt was not willing to tell me how HA is doing- didn't want to wake up- spoke to  nursing- will let me know as pt wakes up more 4. Mood/Behavior/Sleep: Provide emotional support             -antipsychotic agents: N/A  5.  Neuropsych/cognition: This patient is? capable of making decisions on his own behalf.  6. Skin/Wound Care: Routine skin checks  7. Fluids/Electrolytes/Nutrition: Routine in and outs with follow-up chemistries  8.  Cerebral edema.  Hypertonic saline discontinued  9.  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  Status post IR for thrombectomy was discharged on Eliquis - now changed to Coumadin , conitnue  10.  History of stroke/recent viral meningitis.  Completed course of Doxy 11.  Refractory headaches.  Medrol  Dosepak.  Topamax  discontinued due to low bicarb- might need to start Elavil , since HA's still 6/10  5/9- still having constant HA's  5/10- pain up to 10/10 today- got head CT due to 10/10 HA- looks OK- no changes- reexamined pt's neck- very tight musculature- will order Robaxin  750 mg q6 hours prn for neck/posterior HA's- as well as lidocaine  patches nightly- added Elavil  25 mg at bedtime for prevention- and asked therapy to do myofascial release for pt.   5/11- muscles looser- HA appears to be less- appears more comfortable  5/12 not using robaxin  frequently, will schedule AM dose. family indicates headaches/neck pain overall improving  5/13- HA down to 5/10 this AM  5/16- Fioricet  stopped by Neuro- due to risk of rebound HA's- added Elavil  and then increased to 50 mg at bedtime today.   -cannot do Trp Injections since his INR is going to be 2.5 to 3.5- and that's too high for me to feel comfortable doing trp injections  5/17 neurology following, decreased depakote , encouraged hydration   5/18 discussed with neurology, Zanaflex  was started, consider botox injections for HA  5/20- Per therapy, HA's getting worse and participating with therapy less- will increase Elavil  to 75 mg at bedtime- and verify using lidoderm  patches. Cannot do B blocker or Ca channel blocker due to low BP. Cannot do Trp injections due to INR 3.7  5/22- Will see if family comes here- might be able ot try Trp  injections for HA today if INR 2.7- but only today  5/23- will monitor INR and see if next Tuesday can try to do Trp injections  5/26- did trigger point injections today  5/27- initially after injections was helpful- but pt was not able to speak with me today-wanted to sleep 12.  Hyperlipidemia.  Continue Lipitor   13.  Congenital Deafness.  Patient does use some sign language- but mainly gestures per ASL interpretor 14.  GERD.  Continue Protonix    15. Abd pain/Constipation  5/9- pt clear having constant abd pain- will get CT of abd/pelvis with contrast to assess abd pain   5/10- Abd CT looks good  -LBM 3 days ago- ordered Sorbitol  for constipation  5/11- LBM yesterday  5/12 LBM documented yesterday  5/13- LBM 2 days ago- if no BM by tomorrow, will intervene  5/15- LBM yesterday-so abd pain is better  5/21- LBM yesterday  5/26- LBM 4 days ago- will give Sorbitol  after therapy 16. Anemia. Mild. -5/12 HGB 12.3, stable continue to monitor  17. Code stroke  5/14- Was (-)- but being assessed for seizures.  5/15- still has mild R sided weakness-  EEG showed cortical dysfunction on L side- but no seizures seen- but MRI looked stable- will wait for Neuro note today- had another episode yesterday it appears based on nursing notes, that was weaker standing/sitting than laying down-it appears that this occurs almost every time up with therapy- much weaker than when laying down. 5/16- Neuro spoke with radiology and they said he did actually have a new small stroke that would explain weakness of R side   5/17 neuro following, appears Dr. Janett Medin may see him today 5/20-5/21 Strength still weaker on R side  18. Orthostatic hypotension  5/15- On Midodrine  5 mg TID and on IVF's- 90s systolic- laying down on chart- he's not dry based on BUN/Cr ratio- Off IVF's but if BP stays low, will restart IVF's.  - will increase Midodrine  to 7.5 mg TID  5/16- will d/w therapy and see how it's doing for him to see if  need to increase midodrine  again.   5/18 BP stable, monitor how he does with therapy tomorrow  5/19- changed to 15/7  5/20- BP still low 100's systolic- if drops again, will need to maximize Midodrine  at 10 mg TID.   Diastolic BP mildly elevated, continue to monitor TID  19. milky discharge from his L ear and some clear discharge on his ear noted by nursing. Unable to see TM but does have thick milky looking discharge L ear.  Will start augmentin  and ofloxacin  drops for L ear. May be affecting balance, consider ENT outpatient f/u.  5/19- HA somewhat better this AM? Maybe this was contributing?  5/20- per staff, HA is worse- per pt, it's better- it's probably better when he wakes up, but gets worse when attempts to get up- has been refusing therapy  5/21- pt denied any HA this AM- or nausea- but woke him up to ask- was sleepy. Was c/o ear pain.  If ear pain doesn't improve, will call ENT  5/22- no ear drainage this AM   5/23- If dizziness continues after increasing Midodrine , then I strongly suggest calling ENT over weekend.   5/24: denies dizziness  20. Torticollis: discussed that he may benefit from outpatient Botox   I spent a total of 46   minutes on total care today- >50% coordination  of care- due to  D/w nursing x3; also spoke with OT about d/c and PT about w/c eval today-  Also did team conference to finalize d/c.  LOS: 19 days A FACE TO FACE EVALUATION WAS PERFORMED  Samuel Warren 04/06/2024, 9:49 AM

## 2024-04-06 NOTE — Progress Notes (Addendum)
 Patient ID: Samuel Warren, male   DOB: [birth year], 33 y.o.   MRN: [id number]  Brother made aware hospital bed and hoyer [employer] will be reaching out to plan delivery of equipment and may have a co-pay.   12;46 PM Have found [facility] to accept his referral and try at home aware if does not participate or refuse they will discharge him. Will have PT & OT at home.Message left for brother regarding team update and need to go home via ambulance. Insurance will no longer pay after tomorrow due to refusals and lack of participation  2:34 PM have called brother to give him the number to [employer] to pay the co-pay so the equipment can be delivered prior to discharge. He reports he will pay the co-pay.   2:58 PM met with [name] [employer] sponsor who had questions regarding progress, home health and equipment. She can see he is needing care and feels it will be difficult for the family to provide. Discussed some were here on Monday and will have home health therapies at home and hope he will participate more when there. Discussed if insurance will term and she reports his last day for FMLA is 6/29 and is working on [employer] with financial counselor-Saprese Jones. She will call pt's brother to see if can assist with co-pays for the equipment. Aware will go home via ambulance due to the stairs into the home. Want his equipment to be there prior to sending him home. Discussed other option is NHP and he would not do well nor would any NH take him due to young age, medical needs and language barrier. Wife was present with little daughter but does not apeak english and/or understand english. [name] to reach out to [employer] to work on [employer]. Deborah/Angelina to work on access to my chart to sponsor and family. [name] to call this worker when equipment delivered so can set up ambulance for home

## 2024-04-06 NOTE — Progress Notes (Signed)
 Occupational Therapy Discharge Summary  Patient Details  Name: Samuel Warren MRN: [id number] Date of Birth: [birth year]  Date of Discharge from OT service:{Time; dates multiple:304500300}  {CHL IP REHAB OT TIME CALCULATIONS:304400400}   Patient has met {NUMBERS 0-12:18577} of {NUMBERS 0-12:18577} long term goals due to {due NW:2956213}.  Patient to discharge at overall {LOA:3049010} level.  Patient's care partner {care partner:3041650} to provide the necessary {assistance:3041652} assistance at discharge.    Reasons goals not met: ***  Recommendation:  Patient will benefit from ongoing skilled OT services in {setting:3041680} to continue to advance functional skills in the area of {ADL/iADL:3041649}.  Equipment: {equipment:3041657}  Reasons for discharge: {Reason for discharge:3049018}  Patient/family agrees with progress made and goals achieved: {Pt/Family agree with progress/goals:3049020}  OT Discharge Precautions/Restrictions    General   Vital Signs   Pain   ADL ADL Eating: Minimal assistance Grooming: Moderate assistance, Minimal assistance Upper Body Bathing: Minimal assistance Lower Body Bathing: Moderate assistance Upper Body Dressing: Minimal assistance Lower Body Dressing: Moderate assistance Toileting: Moderate assistance Toilet Transfer: Moderate assistance Tub/Shower Transfer: Moderate assistance Vision   Perception    Praxis   Cognition   Sensation   Motor    Mobility     Trunk/Postural Assessment     Balance   Extremity/Trunk Assessment       Theodoro Fisherman 04/06/2024, 4:46 PM

## 2024-04-06 NOTE — Patient Care Conference (Signed)
 Inpatient RehabilitationTeam Conference and Plan of Care Update Date: 04/06/2024   Time: 1133 am    Patient Name: Samuel Warren      Medical Record Number: [id number]  Date of Birth: Aug 01, 1991 Sex: Male         Room/Bed: 4M13C/4M13C-01 Payor Info: Payor: Advertising copywriter / Plan: [employer] OTHER / Product Type: *No Product type* /    Admit Date/Time:  03/18/2024  6:21 PM  Primary Diagnosis:  Dural venous sinus thrombosis  Hospital Problems: Principal Problem:   Dural venous sinus thrombosis Active Problems:   Orthostatic hypotension   Constipation    Expected Discharge Date: Expected Discharge Date: 04/07/24  Team Members Present: Physician leading conference: Dr. [name] Social Worker Present: Adrianna Albee, LCSW Nurse Present: Jerene Monks, RN PT Present: Aundria Leech, PT OT Present: Nila Barth, OT PPS Coordinator present : Jestine Moron, SLP     Current Status/Progress Goal Weekly Team Focus  Bowel/Bladder   Incontinent episodes of bowel and bladder LBM 5/26   Pt remain free from S/S of infection   Offer toileting q2 hours and PRN. Bowel medication given per order.    Swallow/Nutrition/ Hydration               ADL's   Max-total overall, Mod A sitting balance, completed family ed 5/26   downgraded 5/19 to Mod ADLs, Min A transfers   D/C planning, W/C eval 5/27 with grad day, completed fam ed 5/26 with mechanical hoyer lift    Mobility   as little as mod a for bed mobility, but typically max a   mod i bed mobility, supervision tranfers, gait, stairs  participation, pain management, NMR, balance, transfers    Communication                Safety/Cognition/ Behavioral Observations               Pain   C/O of neck pain/headache.   Pt states pain level <3 out of 10.   Assess pain q shift and PRN. given medication per orders.    Skin   Skin intact   Remain free from S/S of skin breakdown  Assess skin q shift and PRn       Discharge Planning:  Some family here yesterday to learn care and pt was too much physical care for them. See goals of supervision/min having a bad day? have ordered hospital bed and ques if needs hoyer? Trying to get home health to accept him    Team Discussion: Patient was admitted post persistent dural venous sinus thrombosis.Patient has congenital deafness. Patient limited by headaches, self limiting behaviors and unwillingness to participate with therapies.    Patient on target to meet rehab goals: no, currently patient is max-total overall with ADLs and max assist with bed mobility. Overall goals at discharge are set for mod assistance.   *See Care Plan and progress notes for long and short-term goals.   Revisions to Treatment Plan:  W/C eval TRP injections  Hoyer lift  Downgraded goals 15/7 therapy Neurology consult Lidocaine  patches Interpreter Communication board  Teaching Needs: Safety, medications, transfers, toileting, mechanical hoyer lift education,  etc    Current Barriers to Discharge: Decreased caregiver support, Home enviroment access/layout, Incontinence, and Behavior  Possible Resolutions to Barriers: Family Education Home health follow-up DME: mechanical hoyer lift, hospital bed Ambulance transport     Medical Summary Current Status: trying to get H/H d/c tomorrow- hasn't made progress in therapy dramatically  Barriers  to Discharge: Uncontrolled Pain;Other (comments);Behavior/Mood;Self-care education;Incontinence;Hypotension  Barriers to Discharge Comments: severe HA's- deafness- been refusing therapy lately- typically max A Possible Resolutions to [employer] Focus: did w/c eval this AM- ROHO- side supports- d/c tomorrow-  because not making progress   Continued Need for Acute Rehabilitation Level of Care: The patient requires daily medical management by a physician with specialized training in physical medicine and rehabilitation for the  following reasons: Direction of a multidisciplinary physical rehabilitation program to maximize functional independence : Yes Medical management of patient stability for increased activity during participation in an intensive rehabilitation regime.: Yes Analysis of laboratory values and/or radiology reports with any subsequent need for medication adjustment and/or medical intervention. : Yes   I attest that I was present, lead the team conference, and concur with the assessment and plan of the team.   Harrietta Incorvaia Gayo 04/06/2024, 1133 am

## 2024-04-07 ENCOUNTER — Other Ambulatory Visit (HOSPITAL_COMMUNITY): Payer: Self-pay

## 2024-04-07 MED ORDER — WARFARIN SODIUM 3 MG PO TABS
3.0000 mg | ORAL_TABLET | Freq: Every day | ORAL | 0 refills | Status: DC
  Filled 2024-04-07: qty 30, 30d supply, fill #0

## 2024-04-07 NOTE — Plan of Care (Signed)
 Problem: RH Balance Goal: LTG: Patient will maintain dynamic sitting balance (OT) Description: LTG:  Patient will maintain dynamic sitting balance with assistance during activities of daily living (OT) Outcome: Not Met (add Reason) Flowsheets (Taken 04/07/2024 1532) LTG: Pt will maintain dynamic sitting balance during ADLs with: (lack of progress) Maximal Assistance - Patient 25 - 49% Goal: LTG Patient will maintain dynamic standing with ADLs (OT) Description: LTG:  Patient will maintain dynamic standing balance with assist during activities of daily living (OT)  Outcome: Not Met (add Reason) Flowsheets (Taken 04/07/2024 1532) LTG: Pt will maintain dynamic standing balance during ADLs with: (lack of progress) Maximal Assistance - Patient 25 - 49%   Problem: Sit to Stand Goal: LTG:  Patient will perform sit to stand in prep for activites of daily living with assistance level (OT) Description: LTG:  Patient will perform sit to stand in prep for activites of daily living with assistance level (OT) Outcome: Not Met (add Reason) Flowsheets (Taken 04/07/2024 1532) LTG: PT will perform sit to stand in prep for activites of daily living with assistance level: (lack of progress) --   Problem: RH Grooming Goal: LTG Patient will perform grooming w/assist,cues/equip (OT) Description: LTG: Patient will perform grooming with assist, with/without cues using equipment (OT) Outcome: Not Met (add Reason) Flowsheets (Taken 04/07/2024 1532) LTG: Pt will perform grooming with assistance level of: (lack of progress) Minimal Assistance - Patient > 75%   Problem: RH Dressing Goal: LTG Patient will perform upper body dressing (OT) Description: LTG Patient will perform upper body dressing with assist, with/without cues (OT). Outcome: Not Met (add Reason) Flowsheets (Taken 04/07/2024 1534) LTG: Pt will perform upper body dressing with assistance level of: (lack of progress)  Moderate Assistance - Patient 50 -  74%  Maximal Assistance - Patient 25 - 49% Goal: LTG Patient will perform lower body dressing w/assist (OT) Description: LTG: Patient will perform lower body dressing with assist, with/without cues in positioning using equipment (OT) Outcome: Not Met (add Reason) Flowsheets (Taken 04/07/2024 1534) LTG: Pt will perform lower body dressing with assistance level of: (lack of progress) Total Assistance - Patient < 25%   Problem: RH Toileting Goal: LTG Patient will perform toileting task (3/3 steps) with assistance level (OT) Description: LTG: Patient will perform toileting task (3/3 steps) with assistance level (OT)  Outcome: Not Met (add Reason) Flowsheets (Taken 04/07/2024 1534) LTG: Pt will perform toileting task (3/3 steps) with assistance level: (lack of progress) Total Assistance - Patient < 25%   Problem: RH Toilet Transfers Goal: LTG Patient will perform toilet transfers w/assist (OT) Description: LTG: Patient will perform toilet transfers with assist, with/without cues using equipment (OT) Outcome: Not Met (add Reason) Flowsheets (Taken 04/07/2024 1534) LTG: Pt will perform toilet transfers with assistance level of: (lack of progress) Maximal Assistance - Patient 25 - 49%   Problem: RH Tub/Shower Transfers Goal: LTG Patient will perform tub/shower transfers w/assist (OT) Description: LTG: Patient will perform tub/shower transfers with assist, with/without cues using equipment (OT) Outcome: Not Met (add Reason) Flowsheets (Taken 04/07/2024 1534) LTG: Pt will perform tub/shower stall transfers with assistance level of: (lack of progress) Maximal Assistance - Patient 25 - 49%   Problem: RH Furniture Transfers Goal: LTG Patient will perform furniture transfers w/assist (OT/PT) Description: LTG: Patient will perform furniture transfers  with assistance (OT/PT). Outcome: Not Met (add Reason) Flowsheets (Taken 04/07/2024 1534) LTG: Pt will perform furniture transfers with assist:: (lack of  progress) Maximal Assistance - Patient 25 - 49%  Problem: RH Bathing Goal: LTG Patient will bathe all body parts with assist levels (OT) Description: LTG: Patient will bathe all body parts with assist levels (OT) Outcome: Completed/Met Flowsheets Taken 04/07/2024 1532 LTG: Position pt will perform bathing: (lack of progress) -- Taken 03/29/2024 1550 LTG: Pt will perform bathing with assistance level/cueing: (downgraded d/t lack of progress) Moderate Assistance - Patient 50 - 74%

## 2024-04-07 NOTE — Plan of Care (Signed)
  Problem: Consults Goal: RH STROKE PATIENT EDUCATION Description: See Patient Education module for education specifics  Outcome: Progressing   Problem: RH BOWEL ELIMINATION Goal: RH STG MANAGE BOWEL WITH ASSISTANCE Description: STG Manage Bowel with  minimal Assistance. Outcome: Progressing   Problem: RH BLADDER ELIMINATION Goal: RH STG MANAGE BLADDER WITH ASSISTANCE Description: STG Manage Bladder With  minimal Assistance Outcome: Progressing   Problem: RH SKIN INTEGRITY Goal: RH STG SKIN FREE OF INFECTION/BREAKDOWN Description: Manage skin free of infection/ breakdown with minimal assistance Outcome: Progressing   Problem: RH SAFETY Goal: RH STG ADHERE TO SAFETY PRECAUTIONS W/ASSISTANCE/DEVICE Description: STG Adhere to Safety Precautions With minimal  Assistance/Device. Outcome: Progressing   Problem: RH KNOWLEDGE DEFICIT Goal: RH STG INCREASE KNOWLEDGE OF STROKE PROPHYLAXIS Description: Manage increase knowledge of stroke prohylaxis with minimal assistance from spouse and family members using educational materials provided Outcome: Progressing   Problem: RH KNOWLEDGE DEFICIT Goal: RH STG INCREASE KNOWLEGDE OF HYPERLIPIDEMIA Description: Manage increase knowledge of hyperlipidemia with minimal assistance from spouse and family members using educational materials provided Outcome: Progressing

## 2024-04-07 NOTE — Progress Notes (Signed)
 PROGRESS NOTE   Subjective/Complaints:  Pt reports that HA is mild- held hand up to show tiny HA.  Which is great-   ROS: Limited by language and deafness  Objective:   No results found.   No results for input(s): "WBC", "HGB", "HCT", "PLT" in the last 72 hours.   No results for input(s): "NA", "K", "CL", "CO2", "GLUCOSE", "BUN", "CREATININE", "CALCIUM " in the last 72 hours.    Intake/Output Summary (Last 24 hours) at 04/07/2024 0945 Last data filed at 04/07/2024 0500 Gross per 24 hour  Intake 796 ml  Output --  Net 796 ml           Physical Exam: Vital Signs Blood pressure 104/74, pulse 86, temperature 98.5 F (36.9 C), resp. rate 16, height 5\' 4"  (1.626 m), weight 61.3 kg, SpO2 95%.     General: awake, alert, more interactive;  supine in bed; no torticollis seen lying in bed; NAD HENT: conjugate gaze; oropharynx moist CV: regular rate and rhythm; no JVD Pulmonary: CTA B/L; no W/R/R- good air movement GI: soft, NT, ND, (+)BS Psychiatric: flat, but slightly more interactive Neurological: slightly more interactive today-  MSK- splenius capitus, scalenes- post auricular; and levators and upper traps tight- - no torticllis noted when laying in bed-  Musculoskeletal:        General: Normal range of motion.     Cervical back: Neck supple. No tenderness.     Comments: Moving all 4 extremities in bed Skin:    General: Skin is warm and dry.     Comments:  IV out of neck is gone- wounds healing  Neurological:     Mental Status: He is alert.     Comments: [name] makes eye contact with examiner.  Exam limited by language barrier.  Follows simple demonstrated commands.  No facial droop. Mild ataxia Mild R inattention Mild ot moderate searching pattern on B/L finger to nose, stable 5/25  Neuro exam appears consistent with prior but difficult to test due to communication   Assessment/Plan: 1.  Functional deficits which require 3+ hours per day of interdisciplinary therapy in a comprehensive inpatient rehab setting. Physiatrist is providing close team supervision and 24 hour management of active medical problems listed below. Physiatrist and rehab team continue to assess barriers to discharge/monitor patient progress toward functional and medical goals  Care Tool:  Bathing    Body parts bathed by patient: Chest, Abdomen, Front perineal area, Buttocks, Right upper leg, Left upper leg, Right lower leg, Left lower leg, Face, Right arm, Left arm         Bathing assist Assist Level: Moderate Assistance - Patient 50 - 74%     Upper Body Dressing/Undressing Upper body dressing   What is the patient wearing?: Pull over shirt    Upper body assist Assist Level: Moderate Assistance - Patient 50 - 74%    Lower Body Dressing/Undressing Lower body dressing      What is the patient wearing?: Pants     Lower body assist Assist for lower body dressing: Maximal Assistance - Patient 25 - 49%     Toileting Toileting    Toileting assist Assist for toileting:  2 Helpers     Transfers Chair/bed transfer  Transfers assist     Chair/bed transfer assist level: Maximal Assistance - Patient 25 - 49%     Locomotion Ambulation   Ambulation assist   Ambulation activity did not occur: Safety/medical concerns (hemiparesis, balance, impaired motor planning/sequencing)  Assist level: Maximal Assistance - Patient 25 - 49% Assistive device: Hand held assist Max distance: 25   Walk 10 feet activity   Assist  Walk 10 feet activity did not occur: Safety/medical concerns (hemiparesis, balance, impaired motor planning/sequencing)  Assist level: Maximal Assistance - Patient 25 - 49% Assistive device: Hand held assist   Walk 50 feet activity   Assist Walk 50 feet with 2 turns activity did not occur: Safety/medical concerns (hemiparesis, balance, impaired motor  planning/sequencing)  Assist level: Moderate Assistance - Patient - 50 - 74% Assistive device: Hand held assist    Walk 150 feet activity   Assist Walk 150 feet activity did not occur: Safety/medical concerns (hemiparesis, balance, impaired motor planning/sequencing)         Walk 10 feet on uneven surface  activity   Assist Walk 10 feet on uneven surfaces activity did not occur: Safety/medical concerns (hemiparesis, balance, impaired motor planning/sequencing)         Wheelchair     Assist Is the patient using a wheelchair?: Yes Type of Wheelchair:  (TIS WC)    Wheelchair assist level: Dependent - Patient 0% Max wheelchair distance: 123ft    Wheelchair 50 feet with 2 turns activity    Assist        Assist Level: Dependent - Patient 0%   Wheelchair 150 feet activity     Assist      Assist Level: Dependent - Patient 0%   Blood pressure 104/74, pulse 86, temperature 98.5 F (36.9 C), resp. rate 16, height 5\' 4"  (1.626 m), weight 61.3 kg, SpO2 95%.  Medical Problem List and Plan: 1. Functional deficits secondary to persistent dural venous sinus thrombosis             -patient may  shower             -ELOS/Goals: min A- 18-21 days  D/c 5/28  Pt not making a lot of gains and has gone backwards in function- still refusing therapy frequently due ot HA's- explained has to do therapy  Heavy mod A yesterday to transfer- will likely need power w/c to go home- asked PT to scheudle a w/c eval  D/c today by transport F/u with me- will need trp injections at f/u.  2.  Antithrombotics: -DVT/anticoagulation:  Pharmaceutical: Coumadin  5/9- Last INR 2.3- managed by pharmacy 5/13- INR 2.1 5/15- INR 2.5 5/16- new goal INR 2.5 to 3.5 since had new stroke per Neuro 5/19- INR 3.0 this AM 5/20- INR a little high at 3.7 5/21- INR 3.2 5/24 INR reviewed and is at goal at 3.3 5/26- INR 3.1 yesterday 5/27- INR 2.6 3. Pain Management: Fioricet  as needed for  refractory headaches, Tylenol  as needed  5/9- having HA's- they attempted Topamax - might need to try Elavil - if doesn't improve by tomorrow, will add Elavil  25 mg at bedtime  5/10- started Elavil  25 mg at bedtime- and lidoderm  patches- 2 patches 8pm to 8am- increased Fioricet  to q4 hours prn- cannot have Topamax  due to low Bicarb levels  5/11- HA somewhat better this AM per son by phone- pt continues to point at 10/10- but muscles in neck much looser  5/13- HA 5/10 this  AM- might benefit from TrP Injections but hard to assess consent?  5/14- will d/w family and pt tomorrow  5/15- pt's Brother- told me had severe HA yesterday- Fioricet  taken off by Neurology but they added/increased Depakote  for HA control and possible seizures coverage-   5/16- increased Elavil  to 50 mg at bedtime for HA's- has tylenol  as needed otherwise- cannot do Trp injections since INR is going too high  5/18 discussed with neurology, do not suspect related to intracranial hypotension-no plan for epidural patch at this time.  Will start Zanaflex .  5/19- HA down to 5/10 this AM- if INR isn't higher, might try just upper trap Trp injections tomorrow?  5/22- HA 5/10 per pt- no ear drainage this AM   5/23- still having HA's per team, but so sleepy this Am, cannot really increase Elavil  dosage- and Neuro was trying to titrate his Dpeakote, but he had too many side effects- cannot do B blockers due to low BP nor Calcium  channel blockers- will need Neuro to manage HA's after d/c.   5/24: kpad added for neck pain  5/26- Did trigger point injections today.   5/27- pt was not willing to tell me how HA is doing- didn't want to wake up- spoke to  nursing- will let me know as pt wakes up more  5/28- HA "tiny amount"- today 4. Mood/Behavior/Sleep: Provide emotional support             -antipsychotic agents: N/A  5. Neuropsych/cognition: This patient is? capable of making decisions on his own behalf.  6. Skin/Wound Care: Routine skin  checks  7. Fluids/Electrolytes/Nutrition: Routine in and outs with follow-up chemistries  8.  Cerebral edema.  Hypertonic saline discontinued  9.  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  Status post IR for thrombectomy was discharged on Eliquis - now changed to Coumadin , conitnue  10.  History of stroke/recent viral meningitis.  Completed course of Doxy 11.  Refractory headaches.  Medrol  Dosepak.  Topamax  discontinued due to low bicarb- might need to start Elavil , since HA's still 6/10  5/9- still having constant HA's  5/10- pain up to 10/10 today- got head CT due to 10/10 HA- looks OK- no changes- reexamined pt's neck- very tight musculature- will order Robaxin  750 mg q6 hours prn for neck/posterior HA's- as well as lidocaine  patches nightly- added Elavil  25 mg at bedtime for prevention- and asked therapy to do myofascial release for pt.   5/11- muscles looser- HA appears to be less- appears more comfortable  5/12 not using robaxin  frequently, will schedule AM dose. family indicates headaches/neck pain overall improving  5/13- HA down to 5/10 this AM  5/16- Fioricet  stopped by Neuro- due to risk of rebound HA's- added Elavil  and then increased to 50 mg at bedtime today.   -cannot do Trp Injections since his INR is going to be 2.5 to 3.5- and that's too high for me to feel comfortable doing trp injections  5/17 neurology following, decreased depakote , encouraged hydration   5/18 discussed with neurology, Zanaflex  was started, consider botox injections for HA  5/20- Per therapy, HA's getting worse and participating with therapy less- will increase Elavil  to 75 mg at bedtime- and verify using lidoderm  patches. Cannot do B blocker or Ca channel blocker due to low BP. Cannot do Trp injections due to INR 3.7  5/22- Will see if family comes here- might be able ot try Trp injections for HA today if INR 2.7- but only today  5/23-  will monitor INR and see if next Tuesday can try to do Trp  injections  5/26- did trigger point injections today  5/27- initially after injections was helpful- but pt was not able to speak with me today-wanted to sleep  5/28- got results from Trp injections- will schedule at f/u 12.  Hyperlipidemia.  Continue Lipitor   13.  Congenital Deafness.  Patient does use some sign language- but mainly gestures per ASL interpretor 14.  GERD.  Continue Protonix    15. Abd pain/Constipation  5/9- pt clear having constant abd pain- will get CT of abd/pelvis with contrast to assess abd pain   5/10- Abd CT looks good  -LBM 3 days ago- ordered Sorbitol  for constipation  5/11- LBM yesterday  5/12 LBM documented yesterday  5/13- LBM 2 days ago- if no BM by tomorrow, will intervene  5/15- LBM yesterday-so abd pain is better  5/21- LBM yesterday  5/26- LBM 4 days ago- will give Sorbitol  after therapy 16. Anemia. Mild. -5/12 HGB 12.3, stable continue to monitor  17. Code stroke  5/14- Was (-)- but being assessed for seizures.  5/15- still has mild R sided weakness-  EEG showed cortical dysfunction on L side- but no seizures seen- but MRI looked stable- will wait for Neuro note today- had another episode yesterday it appears based on nursing notes, that was weaker standing/sitting than laying down-it appears that this occurs almost every time up with therapy- much weaker than when laying down. 5/16- Neuro spoke with radiology and they said he did actually have a new small stroke that would explain weakness of R side   5/17 neuro following, appears Dr. Janett Medin may see him today 5/20-5/21 Strength still weaker on R side  18. Orthostatic hypotension  5/15- On Midodrine  5 mg TID and on IVF's- 90s systolic- laying down on chart- he's not dry based on BUN/Cr ratio- Off IVF's but if BP stays low, will restart IVF's.  - will increase Midodrine  to 7.5 mg TID  5/16- will d/w therapy and see how it's doing for him to see if need to increase midodrine  again.   5/18 BP stable,  monitor how he does with therapy tomorrow  5/19- changed to 15/7  5/20- BP still low 100's systolic- if drops again, will need to maximize Midodrine  at 10 mg TID.   Diastolic BP mildly elevated, continue to monitor TID  19. milky discharge from his L ear and some clear discharge on his ear noted by nursing. Unable to see TM but does have thick milky looking discharge L ear.  Will start augmentin  and ofloxacin  drops for L ear. May be affecting balance, consider ENT outpatient f/u.  5/19- HA somewhat better this AM? Maybe this was contributing?  5/20- per staff, HA is worse- per pt, it's better- it's probably better when he wakes up, but gets worse when attempts to get up- has been refusing therapy  5/21- pt denied any HA this AM- or nausea- but woke him up to ask- was sleepy. Was c/o ear pain.  If ear pain doesn't improve, will call ENT  5/22- no ear drainage this AM   5/23- If dizziness continues after increasing Midodrine , then I strongly suggest calling ENT over weekend.   5/24: denies dizziness  20. Torticollis? Only when up in w/c- : discussed that he may benefit from outpatient Botox   The patient is medically ready for discharge to home and will need follow-up with [facility]. In addition, they will need to follow  up with their PCP, Neurology for CSVT. He will need to see me for trigger point injections at f/u  LOS: 20 days A FACE TO FACE EVALUATION WAS PERFORMED  Samuel Warren 04/07/2024, 9:45 AM

## 2024-04-07 NOTE — Progress Notes (Signed)
 Inpatient Rehabilitation Care Coordinator Discharge Note   Patient Details  Name: Samuel Warren MRN: [id number] Date of Birth: 1991-10-15   Discharge location: HOME WITH MULTIPLE FAMILY MEMBERS AWARE OF HIS 24/7 PHYSICAL CARE NEEDS  Length of Stay: 20 DAYS  Discharge activity level: MOD/MAX LEVEL  Home/community participation: ACTIVE  Patient response GN:FAOZHY Literacy - How often do you need to have someone help you when you read instructions, pamphlets, or other written material from your doctor or pharmacy?: Always  Patient response QM:VHQION Isolation - How often do you feel lonely or isolated from those around you?: Patient unable to respond  Services provided included: MD, RD, PT, OT, SLP, RN, TR, Pharmacy, Neuropsych, SW  Financial Services:  Field seismologist Utilized: Production designer, theatre/television/film  Choices offered to/list presented to: PT AND BROTHER  Follow-up services arranged:  Home Health, DME, Patient/Family has no preference for HH/DME agencies Home Health Agency: [facility]  PT  & OT    DME : [facility]   [facility]  [name] WORKING ON MEDICAID FOR PT AND FAMILY AMBULANCE TRANSPORT HOME DUE TO STAIRS INTO HOME  Patient response to transportation need: Is the patient able to respond to transportation needs?: Yes In the past 12 months, has lack of transportation kept you from medical appointments or from getting medications?: No In the past 12 months, has lack of transportation kept you from meetings, work, or from getting things needed for daily living?: No   Patient/Family verbalized understanding of follow-up arrangements:  Yes  Individual responsible for coordination of the follow-up plan: THOIAH-BROTHER [phone]  Confirmed correct DME delivered: Samuel Warren 04/07/2024    Comments (or additional information):FAMILY WAS IN FOR HANDS ON EDUCATION PT IS MUCH CARE AND SEEMS TO HAVE GIVEN UP ON  TRYING DUE TO PAIN ISSUES AND FRUSTRATION. HE WILL BE EXTREMELY HIGH RISK TO COME BACK INTO THE HOSPITAL DUE TO HIS MEDICAL ISSUES. HE DID NOT BENEFIT FROM BEING ON REHAB BUT GOING TO A NH IS NOT AN OPTION EITHER WITH HIS LANGUAGE/HEARING BARRIERS AND YOUNG AGE. INSURANCE WILL TERM 05/09/2024 AND WORKING ON MEDICAID FOR COVERAGE  Summary of Stay    Date/Time Discharge Planning CSW  04/06/24 1324 Some family here yesterday to learn care and pt was too much physical care for them. See goals of supervision/min having a bad day? have ordered hospital bed and ques if needs hoyer? Trying to get home health to accept him RGD  03/30/24 0844 Family here daily and can provide the assist needed. Pt seems to be fluctuating depending on his pain and the day. He was going to OP PTA can set up again and awaiting DME needs RGD  03/23/24 0854 Home with multiple family members between can provide 24/7 care. Difficult to communicate with due to language and deaf barrier. RGD       Samuel Warren

## 2024-04-07 NOTE — Progress Notes (Signed)
 PHARMACY - ANTICOAGULATION CONSULT NOTE  Pharmacy Consult for Warfarin Indication:  central venous sinus thrombosis  No Known Allergies  Patient Measurements: Height: 5\' 4"  (162.6 cm) Weight: 61.3 kg (135 lb 2.3 oz) IBW/kg (Calculated) : 59.2 HEPARIN  DW (KG): 65.3  Vital Signs: Temp: 98.5 F (36.9 C) (05/28 0545) BP: 104/74 (05/28 0545) Pulse Rate: 86 (05/28 0545)  Labs: Recent Labs    04/06/24 0453  LABPROT 27.8*  INR 2.6*    Estimated Creatinine Clearance: 94.7 mL/min (by C-G formula based on SCr of 0.92 mg/dL).  Assessment: 89 YOM with history of central venous sinus thrombosis on Eliquis  PTA. He returned with new onset of R-sided weakness and cerebral edema.  Patient was initially on IV heparin , then transitioned back to PTA Eliquis .  Pharmacy intially consulted to transition patient to IV heparin  bridge to Warfarin starting 5/3. IV heparin  stopped on 5/6 with therapeutic INR and currently on Warfarin only.   New INR goal of 2.5 to 3.5 per Dr. Christiane Cowing (Neuro) due to new stroke.  INR therapeutic at 2.6 today with Warfarin 3 mg po daily -- continue at discharge with follow up INR Friday.   Goal of Therapy:  INR goal 2.5 to 3.5 Monitor platelets by anticoagulation protocol: Yes   Plan:  Warfarin 3mg  daily  Monitor INR every other day  Monitor CBC, clinical course, s/sx of bleed, PO intake/diet, Drug-Drug Interactions   Thank you for allowing pharmacy to be a part of this patient's care.   Victoria Grass, PharmD 04/07/2024 8:29 AM  **Pharmacist phone directory can be found on [email or web address] listed under [facility]**

## 2024-04-07 NOTE — Progress Notes (Addendum)
 Patient ID: Samuel Warren, male   DOB: 08-Aug-1991, 33 y.o.   MRN: [id number] Will need to await delivery of his equipment prior to setting up ambulance transfer home. [name] sponsor to Biomedical engineer know when there  9:43 AM Messaged PA and RN to wait for brother to come back after equipment delivered to go over DC instructions since he understands more than any of the other family members  10;22 AM Family is aware of the ability to appeal the discharge but they will need to initiate it. Will provide to insurance if needed, they have card with number on the back. Insurance has denied him as of today due to lack of progress and refusals.

## 2024-04-10 ENCOUNTER — Emergency Department (HOSPITAL_COMMUNITY)

## 2024-04-10 ENCOUNTER — Inpatient Hospital Stay (HOSPITAL_COMMUNITY)
Admission: EM | Admit: 2024-04-10 | Discharge: 2024-04-16 | DRG: 080 | Disposition: A | Discharge status: Home Health | Attending: Internal Medicine | Admitting: Internal Medicine

## 2024-04-10 ENCOUNTER — Other Ambulatory Visit: Payer: Self-pay

## 2024-04-10 DIAGNOSIS — A879 Viral meningitis, unspecified: Secondary | ICD-10-CM | POA: Diagnosis not present

## 2024-04-10 DIAGNOSIS — I69898 Other sequelae of other cerebrovascular disease: Secondary | ICD-10-CM | POA: Diagnosis not present

## 2024-04-10 DIAGNOSIS — I5022 Chronic systolic (congestive) heart failure: Secondary | ICD-10-CM | POA: Diagnosis present

## 2024-04-10 DIAGNOSIS — K5900 Constipation, unspecified: Secondary | ICD-10-CM | POA: Diagnosis present

## 2024-04-10 DIAGNOSIS — Z8661 Personal history of infections of the central nervous system: Secondary | ICD-10-CM | POA: Diagnosis not present

## 2024-04-10 DIAGNOSIS — R627 Adult failure to thrive: Secondary | ICD-10-CM | POA: Diagnosis present

## 2024-04-10 DIAGNOSIS — I69391 Dysphagia following cerebral infarction: Secondary | ICD-10-CM | POA: Diagnosis not present

## 2024-04-10 DIAGNOSIS — Z79899 Other long term (current) drug therapy: Secondary | ICD-10-CM

## 2024-04-10 DIAGNOSIS — G08 Intracranial and intraspinal phlebitis and thrombophlebitis: Secondary | ICD-10-CM | POA: Diagnosis present

## 2024-04-10 DIAGNOSIS — G936 Cerebral edema: Principal | ICD-10-CM | POA: Diagnosis present

## 2024-04-10 DIAGNOSIS — G9341 Metabolic encephalopathy: Secondary | ICD-10-CM | POA: Diagnosis present

## 2024-04-10 DIAGNOSIS — M6281 Muscle weakness (generalized): Secondary | ICD-10-CM | POA: Insufficient documentation

## 2024-04-10 DIAGNOSIS — G935 Compression of brain: Secondary | ICD-10-CM | POA: Diagnosis present

## 2024-04-10 DIAGNOSIS — I110 Hypertensive heart disease with heart failure: Secondary | ICD-10-CM | POA: Diagnosis present

## 2024-04-10 DIAGNOSIS — R5381 Other malaise: Secondary | ICD-10-CM | POA: Diagnosis not present

## 2024-04-10 DIAGNOSIS — A179 Tuberculosis of nervous system, unspecified: Secondary | ICD-10-CM | POA: Diagnosis not present

## 2024-04-10 DIAGNOSIS — I951 Orthostatic hypotension: Secondary | ICD-10-CM | POA: Diagnosis present

## 2024-04-10 DIAGNOSIS — I776 Arteritis, unspecified: Secondary | ICD-10-CM | POA: Diagnosis not present

## 2024-04-10 DIAGNOSIS — R1310 Dysphagia, unspecified: Secondary | ICD-10-CM | POA: Diagnosis present

## 2024-04-10 DIAGNOSIS — K219 Gastro-esophageal reflux disease without esophagitis: Secondary | ICD-10-CM | POA: Diagnosis present

## 2024-04-10 DIAGNOSIS — R791 Abnormal coagulation profile: Secondary | ICD-10-CM | POA: Diagnosis present

## 2024-04-10 DIAGNOSIS — E785 Hyperlipidemia, unspecified: Secondary | ICD-10-CM | POA: Diagnosis present

## 2024-04-10 DIAGNOSIS — I69351 Hemiplegia and hemiparesis following cerebral infarction affecting right dominant side: Secondary | ICD-10-CM

## 2024-04-10 DIAGNOSIS — G0490 Encephalitis and encephalomyelitis, unspecified: Principal | ICD-10-CM

## 2024-04-10 DIAGNOSIS — I10 Essential (primary) hypertension: Secondary | ICD-10-CM | POA: Diagnosis not present

## 2024-04-10 DIAGNOSIS — Z7901 Long term (current) use of anticoagulants: Secondary | ICD-10-CM | POA: Diagnosis not present

## 2024-04-10 DIAGNOSIS — E860 Dehydration: Secondary | ICD-10-CM | POA: Diagnosis present

## 2024-04-10 DIAGNOSIS — G9340 Encephalopathy, unspecified: Secondary | ICD-10-CM

## 2024-04-10 DIAGNOSIS — R4182 Altered mental status, unspecified: Secondary | ICD-10-CM | POA: Diagnosis present

## 2024-04-10 LAB — CBC WITH DIFFERENTIAL/PLATELET
Abs Immature Granulocytes: 0.07 10*3/uL (ref 0.00–0.07)
Basophils Absolute: 0 10*3/uL (ref 0.0–0.1)
Basophils Relative: 0 %
Eosinophils Absolute: 0 10*3/uL (ref 0.0–0.5)
Eosinophils Relative: 0 %
HCT: 43.2 % (ref 39.0–52.0)
Hemoglobin: 13.8 g/dL (ref 13.0–17.0)
Immature Granulocytes: 1 %
Lymphocytes Relative: 5 %
Lymphs Abs: 0.8 10*3/uL (ref 0.7–4.0)
MCH: 26.7 pg (ref 26.0–34.0)
MCHC: 31.9 g/dL (ref 30.0–36.0)
MCV: 83.6 fL (ref 80.0–100.0)
Monocytes Absolute: 0.7 10*3/uL (ref 0.1–1.0)
Monocytes Relative: 5 %
Neutro Abs: 13.3 10*3/uL — ABNORMAL HIGH (ref 1.7–7.7)
Neutrophils Relative %: 89 %
Platelets: 275 10*3/uL (ref 150–400)
RBC: 5.17 MIL/uL (ref 4.22–5.81)
RDW: 13.2 % (ref 11.5–15.5)
WBC: 15 10*3/uL — ABNORMAL HIGH (ref 4.0–10.5)
nRBC: 0 % (ref 0.0–0.2)

## 2024-04-10 LAB — I-STAT CHEM 8, ED
BUN: 10 mg/dL (ref 6–20)
Calcium, Ion: 1.23 mmol/L (ref 1.15–1.40)
Chloride: 104 mmol/L (ref 98–111)
Creatinine, Ser: 0.8 mg/dL (ref 0.61–1.24)
Glucose, Bld: 151 mg/dL — ABNORMAL HIGH (ref 70–99)
HCT: 44 % (ref 39.0–52.0)
Hemoglobin: 15 g/dL (ref 13.0–17.0)
Potassium: 3.7 mmol/L (ref 3.5–5.1)
Sodium: 141 mmol/L (ref 135–145)
TCO2: 23 mmol/L (ref 22–32)

## 2024-04-10 LAB — COMPREHENSIVE METABOLIC PANEL WITH GFR
ALT: 46 U/L — ABNORMAL HIGH (ref 0–44)
AST: 19 U/L (ref 15–41)
Albumin: 4.5 g/dL (ref 3.5–5.0)
Alkaline Phosphatase: 57 U/L (ref 38–126)
Anion gap: 12 (ref 5–15)
BUN: 9 mg/dL (ref 6–20)
CO2: 23 mmol/L (ref 22–32)
Calcium: 10.2 mg/dL (ref 8.9–10.3)
Chloride: 103 mmol/L (ref 98–111)
Creatinine, Ser: 0.81 mg/dL (ref 0.61–1.24)
GFR, Estimated: >60 mL/min (ref >60)
Glucose, Bld: 146 mg/dL — ABNORMAL HIGH (ref 70–99)
Potassium: 3.6 mmol/L (ref 3.5–5.1)
Sodium: 138 mmol/L (ref 135–145)
Total Bilirubin: 0.4 mg/dL (ref 0.0–1.2)
Total Protein: 7.9 g/dL (ref 6.5–8.1)

## 2024-04-10 LAB — PROTIME-INR
INR: 4.1 (ref 0.8–1.2)
Prothrombin Time: 39.7 s — ABNORMAL HIGH (ref 11.4–15.2)

## 2024-04-10 LAB — LACTIC ACID, PLASMA: Lactic Acid, Venous: 2.8 mmol/L (ref 0.5–1.9)

## 2024-04-10 MED ORDER — ATORVASTATIN CALCIUM 80 MG PO TABS
80.0000 mg | ORAL_TABLET | Freq: Every day | ORAL | Status: DC
  Administered 2024-04-12 – 2024-04-16 (×4): 80 mg via ORAL
  Filled 2024-04-10 (×6): qty 1

## 2024-04-10 MED ORDER — PANTOPRAZOLE SODIUM 40 MG PO TBEC
40.0000 mg | DELAYED_RELEASE_TABLET | Freq: Every day | ORAL | Status: DC
  Administered 2024-04-12 – 2024-04-16 (×4): 40 mg via ORAL
  Filled 2024-04-10 (×6): qty 1

## 2024-04-10 MED ORDER — DIVALPROEX SODIUM 250 MG PO DR TAB
250.0000 mg | DELAYED_RELEASE_TABLET | Freq: Two times a day (BID) | ORAL | Status: DC
  Filled 2024-04-10 (×2): qty 1

## 2024-04-10 MED ORDER — AMITRIPTYLINE HCL 50 MG PO TABS
50.0000 mg | ORAL_TABLET | Freq: Every day | ORAL | Status: DC
  Filled 2024-04-10: qty 1

## 2024-04-10 MED ORDER — POLYETHYLENE GLYCOL 3350 17 G PO PACK: 17.0000 g | PACK | Freq: Every day | ORAL | Status: DC | PRN

## 2024-04-10 MED ORDER — METHOCARBAMOL 500 MG PO TABS: 750.0000 mg | ORAL_TABLET | Freq: Four times a day (QID) | ORAL | Status: DC | PRN

## 2024-04-10 MED ORDER — SODIUM CHLORIDE 0.9 % IV BOLUS
1000.0000 mL | Freq: Once | INTRAVENOUS | Status: AC
  Administered 2024-04-10: 1000 mL via INTRAVENOUS

## 2024-04-10 MED ORDER — SODIUM CHLORIDE 0.9 % IV SOLN
2.0000 g | Freq: Once | INTRAVENOUS | Status: AC
  Administered 2024-04-10: 2 g via INTRAVENOUS
  Filled 2024-04-10: qty 2000

## 2024-04-10 MED ORDER — ACETAMINOPHEN 325 MG PO TABS: 650.0000 mg | ORAL_TABLET | ORAL | Status: DC | PRN

## 2024-04-10 MED ORDER — IOHEXOL 350 MG/ML SOLN
75.0000 mL | Freq: Once | INTRAVENOUS | Status: AC | PRN
  Administered 2024-04-10: 75 mL via INTRAVENOUS

## 2024-04-10 MED ORDER — MIDODRINE HCL 5 MG PO TABS
5.0000 mg | ORAL_TABLET | Freq: Three times a day (TID) | ORAL | Status: DC
  Administered 2024-04-11: 5 mg via ORAL
  Filled 2024-04-10: qty 1

## 2024-04-10 MED ORDER — LACTATED RINGERS IV SOLN: 150.0000 mL/h | INTRAVENOUS | Status: AC

## 2024-04-10 MED ORDER — VANCOMYCIN HCL 1.25 G IV SOLR
1250.0000 mg | Freq: Once | INTRAVENOUS | Status: AC
  Administered 2024-04-10: 1250 mg via INTRAVENOUS
  Filled 2024-04-10: qty 25

## 2024-04-10 MED ORDER — VANCOMYCIN HCL 750 MG/150ML IV SOLN
750.0000 mg | Freq: Three times a day (TID) | INTRAVENOUS | Status: DC
  Administered 2024-04-11: 750 mg via INTRAVENOUS
  Filled 2024-04-10: qty 150

## 2024-04-10 MED ORDER — ALBUTEROL SULFATE (2.5 MG/3ML) 0.083% IN NEBU: 2.5000 mg | INHALATION_SOLUTION | RESPIRATORY_TRACT | Status: DC | PRN

## 2024-04-10 MED ORDER — ACETAMINOPHEN 650 MG RE SUPP
650.0000 mg | Freq: Four times a day (QID) | RECTAL | Status: DC | PRN
  Filled 2024-04-10: qty 1

## 2024-04-10 MED ORDER — SODIUM CHLORIDE 0.9 % IV SOLN: INTRAVENOUS | Status: AC

## 2024-04-10 MED ORDER — ADULT MULTIVITAMIN W/MINERALS CH
1.0000 | ORAL_TABLET | Freq: Every day | ORAL | Status: DC
  Administered 2024-04-12 – 2024-04-16 (×4): 1 via ORAL
  Filled 2024-04-10 (×6): qty 1

## 2024-04-10 MED ORDER — SODIUM CHLORIDE 0.9 % IV SOLN
2.0000 g | Freq: Once | INTRAVENOUS | Status: AC
  Administered 2024-04-10: 2 g via INTRAVENOUS
  Filled 2024-04-10: qty 20

## 2024-04-10 MED ORDER — ACETAMINOPHEN 325 MG PO TABS
650.0000 mg | ORAL_TABLET | Freq: Four times a day (QID) | ORAL | Status: DC | PRN
Start: 2024-04-10 — End: 2024-04-14
  Administered 2024-04-14 (×2): 650 mg via ORAL
  Filled 2024-04-10 (×4): qty 2

## 2024-04-10 NOTE — H&P (Signed)
 History and Physical    Patient: Samuel Warren WGN:[id number] DOB: April 24, 1991 DOA: 04/10/2024 DOS: the patient was seen and examined on 04/10/2024 PCP: Alto Atta, NP  Patient coming from: Home  Chief Complaint:  Chief Complaint  Patient presents with   Failure To Thrive   HPI: Samuel Warren is a 33 y.o. male with medical history significant of congenital deafness, hyperlipidemia and GERD.  In March 2025, patient was diagnosed with CVST and required IR thrombectomy.  His clinical course were complicated by viral meningitis.  In April 2025, patient was diagnosed with venous infarct within the medial left frontal lobe attributed to be due to nonocclusive dural venous sinus thrombosis.  He was discharged home on Eliquis  but returned to the emergency room a few days after on account of fall.CT and MRI done at that time did reveal extensive dural venous thrombosis and was assessed to possibly be due to evolving venous ischemic changes.  Patient was subsequently switched over to Coumadin .  He was discharged to rehab hospital from 5/8-5/30/2025.    During the course of his stay at the rehab, his INR were maintained between 2.5-3.5.  He was given Fioricet  and Medrol  Dosepak for chronic headaches.  He was also given Zanaflex  and Depakote .  He was last seen by neurology on 03/11/2024 on account of persistent right arm weakness.  CT was negative for large vessel occlusion.  MRI of the brain done at that time showed no acute infarct.  EEG was negative for any seizure activity.  Per family, since discharge from the rehab, patient has been progressively weak.  Has not been tolerating oral intake.  This evening, he was l reported to be ess responsive hence they decided to bring patient to the emergency room to be further evaluated.  No reported seizure-like activity.  No nausea or vomiting.  In the ED, patient was empirically covered with antibiotics for suspected meningitis.  LP was not attempted due to markedly  elevated INR.  Note that LP was not attempted on last admission due to history of cerebral edema   Review of Systems: Unable to review all systems due to lack of cooperation from patient. Past Medical History:  Diagnosis Date   Deaf    Past Surgical History:  Procedure Laterality Date   IR ANGIO INTRA EXTRACRAN SEL COM CAROTID INNOMINATE BILAT MOD SED  02/02/2024   IR ANGIO INTRA EXTRACRAN SEL COM CAROTID INNOMINATE BILAT MOD SED  03/09/2024   IR CT HEAD LTD  03/09/2024   IR THROMBECT VENO MECH MOD SED  02/02/2024   IR THROMBECT VENO MECH MOD SED  03/09/2024   IR VENO SAGITTAL SINUS  03/09/2024   IR VENO/JUGULAR LEFT  02/02/2024   RADIOLOGY WITH ANESTHESIA N/A 02/02/2024   Procedure: RADIOLOGY WITH ANESTHESIA;  Surgeon: Luellen Sages, MD;  Location: [facility] OR;  Service: Radiology;  Laterality: N/A;   RADIOLOGY WITH ANESTHESIA N/A 03/09/2024   Procedure: RADIOLOGY WITH ANESTHESIA;  Surgeon: Luellen Sages, MD;  Location: [facility] OR;  Service: Radiology;  Laterality: N/A;   Social History:  reports that he has never smoked. He has never used smokeless tobacco. He reports that he does not currently use alcohol. He reports that he does not currently use drugs.  No Known Allergies  No family history on file.  Prior to Admission medications   Medication Sig Start Date End Date Taking? Authorizing Provider  acetaminophen  (TYLENOL ) 325 MG tablet Take 2 tablets (650 mg total) by mouth every 4 (four) hours as needed  for mild pain (pain score 1-3) (or temp > 37.5 C (99.5 F)). 03/18/24   [name], [name], MD  amitriptyline  (ELAVIL ) 25 MG tablet Take 2 tablets (50 mg total) by mouth at bedtime. 04/06/24   [name], [name], PA-C  atorvastatin  (LIPITOR ) 80 MG tablet Take 1 tablet (80 mg total) by mouth daily. 04/06/24 05/07/24  [name], [name], PA-C  divalproex  (DEPAKOTE ) 250 MG DR tablet Take 1 tablet (250 mg total) by mouth every 12 (twelve) hours. 04/06/24   [name], [name], PA-C  lidocaine   (LIDODERM ) 5 % Place 2 patches onto the skin daily. Remove & Discard patch within 12 hours or as directed by MD 04/06/24   [name], [name], PA-C  methocarbamol  (ROBAXIN ) 750 MG tablet Take 1 tablet (750 mg total) by mouth every 6 (six) hours as needed for muscle spasms. 04/06/24   [name], [name], PA-C  midodrine  (PROAMATINE ) 5 MG tablet Take 1 tablet (5 mg total) by mouth 3 (three) times daily with meals. 04/06/24   [name], Daniel J, PA-C  Multiple Vitamin (MULTIVITAMIN WITH MINERALS) TABS tablet Take 1 tablet by mouth daily. 04/05/24   [name], [name], PA-C  pantoprazole  (PROTONIX ) 40 MG tablet Take 1 tablet (40 mg total) by mouth daily. 04/06/24   [name], [name], PA-C  polyethylene glycol (MIRALAX  / GLYCOLAX ) 17 g packet Take 17 g by mouth daily as needed. 04/05/24   [name], [name], PA-C  tiZANidine  (ZANAFLEX ) 4 MG tablet Take 1 tablet (4 mg total) by mouth at bedtime. 04/06/24   [name], [name], PA-C  warfarin (COUMADIN ) 3 MG tablet Take 1 tablet (3 mg total) by mouth daily at 4 PM. 04/07/24   Sterling Eisenmenger, PA-C    Physical Exam: Vitals:   04/10/24 2245 04/10/24 2300 04/10/24 2315 04/10/24 2330  BP: 124/89 (!) 136/92 (!) 125/90 (!) 127/92  Pulse: 83 85 91 83  Resp: 14 13 11 11   Temp:      TempSrc:      SpO2: 97% 97% 98% 97%   General: Patient is an ill-appearing gentleman.  Patient is able to minimally follow movement.  He is unable to verbalize due to history of hearing impairment. Neck: Patient is uncooperative hence unable to assess neck stiffness. HEENT: Difficult to assess oral mucosa due to uncooperation from patient. Chest: Diminished breath sounds, poor respiratory effort. Abdomen: Soft nontender Extremities without edema CNS: Unable to assess due to noncooperation.  Data Reviewed:  Sodium is 141, potassium 3.7, chloride 104, glucose is 151, BUN 10, creatinine 0.8, lactic acid was 2.8 hemoglobin is 15, hematocrit 44, WBCs 15 PT 39.7 INR is 4.1  Chest  x-ray shows normal heart size.  No obvious cardiopulmonary disease noted.  CT venogram.  Shows persistent dural venous sinus thrombosis involving the posterior aspect of the sagittal sinus with involvement of the transverse and sigmoid sinuses.  Diffuse loss of cortical sulcation through the brain with effacement of the basilar cisterns and low-lying cerebral tonsils similar to previous exam.   Assessment and Plan:  33 year old male with unfortunate history of congenital deafness, recent diagnosis of venous sinus thrombosis in 01/2024, on anticoagulation with Coumadin , meningoencephalitis, embolic CVA with right-sided weakness  Acute encephalopathy: Etiology multifactorial.  Polypharmacy, history of viral meningitis and evidence of persistent cerebral edema characterized by diffuse loss of cortical sulcation through the brain.  Case was discussed with neurology.  Neurology consult appreciated.  MRI has been requested and pending.  Patient will be admitted to progressive care unit.  Based off on MRI findings we will determine if hypertonic fluids may be warranted.  Will hold off on lidocaine  patches  History of viral meningitis: Patient was recently treated for viral meningitis.  On this visit, due to acute encephalopathy.  Patient was reinitiated on treatment for meningitis with antibiotics and antiviral's.  Will monitor for improvement.  ID input may be warranted to advise on further treatment.  Chronic dural sinus thrombosis: Status post thrombectomy without success on 4/2 night.  Patient is anticoagulated with Coumadin .  He has chronic headaches that was attributed to cerebral edema.  He was previously treated with hypertonic saline and Diamox .  Discussed with neurology, will defer further management to them  History of CVA with right-sided weakness: Suspected embolic phenomenon. On chronic anticoagulation.  Supratherapeutic INR: Will hold off Coumadin  for now.  Daily PT/INR  Congenital  deafness  Hyperlipidemia on statins  Orthostatic hypotension: On midodrine   Constipation: Continue on stool softeners.   Advance Care Planning:   Code Status: Full Code   Consults: Neurology consult  Family Communication: Family at bedside w were updated regarding plan of care  Severity of Illness: The appropriate patient status for this patient is INPATIENT. Inpatient status is judged to be reasonable and necessary in order to provide the required intensity of service to ensure the patient's safety. The patient's presenting symptoms, physical exam findings, and initial radiographic and laboratory data in the context of their chronic comorbidities is felt to place them at high risk for further clinical deterioration. Furthermore, it is not anticipated that the patient will be medically stable for discharge from the hospital within 2 midnights of admission.   * I certify that at the point of admission it is my clinical judgment that the patient will require inpatient hospital care spanning beyond 2 midnights from the point of admission due to high intensity of service, high risk for further deterioration and high frequency of surveillance required.*  Author: Theodora Fish, MD 04/10/2024 11:49 PM  For on call review [email or web address].

## 2024-04-10 NOTE — Progress Notes (Signed)
 Pharmacy Antibiotic Note  Samuel Warren is a 33 y.o. male admitted on 04/10/2024 presenting with AMS, concern for meningitis.  Pharmacy has been consulted for vancomycin  dosing.  Plan: Vancomycin  1250 mg IV x 1, then 750 mg IV q 8h (target vancomycin  trough 15-20) Monitor renal function, Cx/LP to narrow Vancomycin  trough at steady state     Temp (24hrs), Avg:99.8 F (37.7 C), Min:99.8 F (37.7 C), Max:99.8 F (37.7 C)  Recent Labs  Lab 04/10/24 1905 04/10/24 1916  WBC 15.0*  --   CREATININE 0.81 0.80    Estimated Creatinine Clearance: 108.9 mL/min (by C-G formula based on SCr of 0.8 mg/dL).    No Known Allergies  Trinidad Funk, PharmD, [facility] Clinical Pharmacist ED Pharmacist Phone # [phone] 04/10/2024 9:55 PM

## 2024-04-10 NOTE — ED Notes (Signed)
 Pt cold and difficult stick. Only obtained blue bottle for 2nd culture filled as much as I could before pt stopped bleeding.

## 2024-04-10 NOTE — ED Triage Notes (Signed)
 Pt BIB [facility] from home d/t failure to thrive. Pt was recently dc'ed from the hospital due to meningitis and multiple blood clots. Per family, pt has been slowly declining at home. Pt is non-verbal and deaf at baseline.

## 2024-04-10 NOTE — H&P (Incomplete)
 History and Physical    PatientBraylee Warren WUJ:[id number] DOB: January 13, 1991 DOA: 04/10/2024 DOS: the patient was seen and examined on 04/10/2024 PCP: Alto Atta, NP  Patient coming from: Home  Chief Complaint:  Chief Complaint  Patient presents with  . Failure To Thrive   HPI: Samuel Warren is a 33 y.o. male with medical history significant of congenital deafness, hyperlipidemia and GERD.  In March 2025, patient was diagnosed with CVST and required IR thrombectomy.  His clinical course were complicated by viral meningitis.  In April 2025, patient was diagnosed with venous infarct within the medial left frontal lobe attributed to be due to nonocclusive dural venous sinus thrombosis.  He was discharged home on Eliquis  but returned to the emergency room a few days after on account of fall.CT and MRI done at that time did reveal extensive dural venous thrombosis and was assessed to possibly be due to evolving venous ischemic changes.  Patient was subsequently switched over to Coumadin .  He was discharged to rehab hospital from 5/8-5/30/2025.    During the course of his stay at the rehab, his INR was maintained between 2.5-3.5.  He was given Fioricet  and Medrol  Dosepak for headaches.  He was also given Zanaflex  and Depakote .  He was last seen by neurology on 03/11/2024 on account of persistent right arm weakness.  CT was negative for large vessel occlusion.  MRI of the brain done at that time showed no acute infarct.  EEG was negative for any seizure activity.  Per family, since discharge, patient has been progressively weak.  Has not been tolerating oral intake.  This evening he was less responsive hence they decided to bring patient to the emergency room to be further evaluated.  No reported seizure-like activity.  No nausea or vomiting.  In the ED, patient was empirically covered with antibiotics for suspected meningitis.  LP was not attempted due to markedly elevated INR.  Noted that LP was not  attempted on last admission due to history of cerebral edema   Review of Systems: Unable to review all systems due to lack of cooperation from patient. Past Medical History:  Diagnosis Date  . Deaf    Past Surgical History:  Procedure Laterality Date  . IR ANGIO INTRA EXTRACRAN SEL COM CAROTID INNOMINATE BILAT MOD SED  02/02/2024  . IR ANGIO INTRA EXTRACRAN SEL COM CAROTID INNOMINATE BILAT MOD SED  03/09/2024  . IR CT HEAD LTD  03/09/2024  . IR THROMBECT VENO MECH MOD SED  02/02/2024  . IR THROMBECT VENO MECH MOD SED  03/09/2024  . IR VENO SAGITTAL SINUS  03/09/2024  . IR VENO/JUGULAR LEFT  02/02/2024  . RADIOLOGY WITH ANESTHESIA N/A 02/02/2024   Procedure: RADIOLOGY WITH ANESTHESIA;  Surgeon: Luellen Sages, MD;  Location: [facility] OR;  Service: Radiology;  Laterality: N/A;  . RADIOLOGY WITH ANESTHESIA N/A 03/09/2024   Procedure: RADIOLOGY WITH ANESTHESIA;  Surgeon: Luellen Sages, MD;  Location: [facility] OR;  Service: Radiology;  Laterality: N/A;   Social History:  reports that he has never smoked. He has never used smokeless tobacco. He reports that he does not currently use alcohol. He reports that he does not currently use drugs.  No Known Allergies  No family history on file.  Prior to Admission medications   Medication Sig Start Date End Date Taking? Authorizing Provider  acetaminophen  (TYLENOL ) 325 MG tablet Take 2 tablets (650 mg total) by mouth every 4 (four) hours as needed for mild pain (pain score 1-3) (or temp >  37.5 C (99.5 F)). 03/18/24   [name], [name], MD  amitriptyline  (ELAVIL ) 25 MG tablet Take 2 tablets (50 mg total) by mouth at bedtime. 04/06/24   [name], [name], PA-C  atorvastatin  (LIPITOR ) 80 MG tablet Take 1 tablet (80 mg total) by mouth daily. 04/06/24 05/07/24  [name], [name], PA-C  divalproex  (DEPAKOTE ) 250 MG DR tablet Take 1 tablet (250 mg total) by mouth every 12 (twelve) hours. 04/06/24   [name], [name], PA-C  lidocaine  (LIDODERM ) 5 % Place 2 patches  onto the skin daily. Remove & Discard patch within 12 hours or as directed by MD 04/06/24   [name], [name], PA-C  methocarbamol  (ROBAXIN ) 750 MG tablet Take 1 tablet (750 mg total) by mouth every 6 (six) hours as needed for muscle spasms. 04/06/24   [name], [name], PA-C  midodrine  (PROAMATINE ) 5 MG tablet Take 1 tablet (5 mg total) by mouth 3 (three) times daily with meals. 04/06/24   [name], Daniel J, PA-C  Multiple Vitamin (MULTIVITAMIN WITH MINERALS) TABS tablet Take 1 tablet by mouth daily. 04/05/24   [name], [name], PA-C  pantoprazole  (PROTONIX ) 40 MG tablet Take 1 tablet (40 mg total) by mouth daily. 04/06/24   [name], [name], PA-C  polyethylene glycol (MIRALAX  / GLYCOLAX ) 17 g packet Take 17 g by mouth daily as needed. 04/05/24   [name], [name], PA-C  tiZANidine  (ZANAFLEX ) 4 MG tablet Take 1 tablet (4 mg total) by mouth at bedtime. 04/06/24   [name], [name], PA-C  warfarin (COUMADIN ) 3 MG tablet Take 1 tablet (3 mg total) by mouth daily at 4 PM. 04/07/24   Sterling Eisenmenger, PA-C    Physical Exam: Vitals:   04/10/24 2245 04/10/24 2300 04/10/24 2315 04/10/24 2330  BP: 124/89 (!) 136/92 (!) 125/90 (!) 127/92  Pulse: 83 85 91 83  Resp: 14 13 11 11   Temp:      TempSrc:      SpO2: 97% 97% 98% 97%   General: Patient is an ill-appearing gentleman.  Patient is able to minimally follow movement.  He is unable to verbalize due to history of hearing impairment. Neck: Patient is uncooperative hence unable to assess neck stiffness. HEENT: Difficult to assess oral mucosa due to uncooperation from patient. Chest: Diminished breath sounds, poor respiratory effort. Abdomen: Soft nontender Extremities without edema CNS: Unable to assess due to noncooperation.  Data Reviewed:  Sodium is 141, potassium 3.7, chloride 104, glucose is 151, BUN 10, creatinine 0.8, lactic acid was 2.8 hemoglobin is 15, hematocrit 44, WBCs 15 PT 39.7 INR is 4.1 Assessment and Plan: No notes have been  filed under this hospital service. Service: Hospitalist     Advance Care Planning:   Code Status: Full Code ***  Consults: ***  Family Communication: ***  Severity of Illness: {Observation/Inpatient:21159}  Author: Theodora Fish, MD 04/10/2024 11:49 PM  For on call review [email or web address].

## 2024-04-10 NOTE — ED Notes (Signed)
 Patient transported to CT

## 2024-04-10 NOTE — ED Provider Notes (Signed)
 [place] EMERGENCY DEPARTMENT AT [facility] Provider Note  CSN: [id number] Arrival date & time: 04/10/24 1901  Chief Complaint(s) Failure To Thrive  HPI Samuel Warren is a 33 y.o. male who is here today for altered mental status.  Failure to thrive.  Patient has unfortunate history of recent meningitis, dural sinus venous thrombus, has been at home, has reportedly been worsening over the last several days.  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   Past Medical History Past Medical History:  Diagnosis Date   Deaf    Patient Active Problem List   Diagnosis Date Noted   Orthostatic hypotension 03/28/2024   Constipation 03/28/2024   Right hemiplegia (HCC) 03/08/2024   Right sided weakness 03/02/2024   Hyperlipidemia 02/10/2024   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   Acute cerebral venous sinus thrombosis 02/01/2024   Acute encephalopathy 01/25/2024   Headache 01/25/2024   Neck pain 01/25/2024   Leukocytosis 01/25/2024   Home Medication(s) Prior to Admission medications   Medication Sig Start Date End Date Taking? Authorizing Provider  acetaminophen  (TYLENOL ) 325 MG tablet Take 2 tablets (650 mg total) by mouth every 4 (four) hours as needed for mild pain (pain score 1-3) (or temp > 37.5 C (99.5 F)). 03/18/24   [name], [name], MD  amitriptyline  (ELAVIL ) 25 MG tablet Take 2 tablets (50 mg total) by mouth at bedtime. 04/06/24   [name], [name], PA-C  atorvastatin  (LIPITOR ) 80 MG tablet Take 1 tablet (80 mg total) by mouth daily. 04/06/24 05/07/24  [name], [name], PA-C  divalproex  (DEPAKOTE ) 250 MG DR tablet Take 1 tablet (250 mg total) by mouth every 12 (twelve) hours. 04/06/24   [name], [name], PA-C  lidocaine  (LIDODERM ) 5 % Place 2 patches onto the skin daily. Remove & Discard patch within 12 hours or as directed by MD 04/06/24   [name], [name], PA-C  methocarbamol  (ROBAXIN ) 750 MG tablet Take 1  tablet (750 mg total) by mouth every 6 (six) hours as needed for muscle spasms. 04/06/24   [name], [name], PA-C  midodrine  (PROAMATINE ) 5 MG tablet Take 1 tablet (5 mg total) by mouth 3 (three) times daily with meals. 04/06/24   [name], Daniel J, PA-C  Multiple Vitamin (MULTIVITAMIN WITH MINERALS) TABS tablet Take 1 tablet by mouth daily. 04/05/24   [name], [name], PA-C  pantoprazole  (PROTONIX ) 40 MG tablet Take 1 tablet (40 mg total) by mouth daily. 04/06/24   [name], [name], PA-C  polyethylene glycol (MIRALAX  / GLYCOLAX ) 17 g packet Take 17 g by mouth daily as needed. 04/05/24   [name], [name], PA-C  tiZANidine  (ZANAFLEX ) 4 MG tablet Take 1 tablet (4 mg total) by mouth at bedtime. 04/06/24   [name], [name], PA-C  warfarin (COUMADIN ) 3 MG tablet Take 1 tablet (3 mg total) by mouth daily at 4 PM. 04/07/24   [name], [name], PA-C  Past Surgical History Past Surgical History:  Procedure Laterality Date   IR ANGIO INTRA EXTRACRAN SEL COM CAROTID INNOMINATE BILAT MOD SED  02/02/2024   IR ANGIO INTRA EXTRACRAN SEL COM CAROTID INNOMINATE BILAT MOD SED  03/09/2024   IR CT HEAD LTD  03/09/2024   IR THROMBECT VENO MECH MOD SED  02/02/2024   IR THROMBECT VENO MECH MOD SED  03/09/2024   IR VENO SAGITTAL SINUS  03/09/2024   IR VENO/JUGULAR LEFT  02/02/2024   RADIOLOGY WITH ANESTHESIA N/A 02/02/2024   Procedure: RADIOLOGY WITH ANESTHESIA;  Surgeon: Luellen Sages, MD;  Location: [facility] OR;  Service: Radiology;  Laterality: N/A;   RADIOLOGY WITH ANESTHESIA N/A 03/09/2024   Procedure: RADIOLOGY WITH ANESTHESIA;  Surgeon: Luellen Sages, MD;  Location: [facility] OR;  Service: Radiology;  Laterality: N/A;   Family History No family history on file.  Social History Social History   Tobacco Use   Smoking status: Never   Smokeless tobacco: Never  Substance Use Topics    Alcohol use: Not Currently   Drug use: Not Currently   Allergies Patient has no known allergies.  Review of Systems Review of Systems  Physical Exam Vital Signs  I have reviewed the triage vital signs BP (!) 119/97   Pulse 90   Temp 99.8 F (37.7 C) (Rectal)   Resp 12   SpO2 97%   Physical Exam Vitals and nursing note reviewed.  Constitutional:      Appearance: Normal appearance.  HENT:     Head: Normocephalic and atraumatic.  Eyes:     Extraocular Movements: Extraocular movements intact.     Pupils: Pupils are equal, round, and reactive to light.  Cardiovascular:     Rate and Rhythm: Normal rate.  Pulmonary:     Effort: Pulmonary effort is normal.  Abdominal:     General: Abdomen is flat. There is no distension.     Palpations: Abdomen is soft.     Tenderness: There is no abdominal tenderness. There is no guarding.  Musculoskeletal:     Cervical back: Normal range of motion and neck supple.  Neurological:     Mental Status: He is alert.     Comments: Patient unable to meaningfully follow commands     ED Results and Treatments Labs (all labs ordered are listed, but only abnormal results are displayed) Labs Reviewed  COMPREHENSIVE METABOLIC PANEL WITH GFR - Abnormal; Notable for the following components:      Result Value   Glucose, Bld 146 (*)    ALT 46 (*)    All other components within normal limits  CBC WITH DIFFERENTIAL/PLATELET - Abnormal; Notable for the following components:   WBC 15.0 (*)    Neutro Abs 13.3 (*)    All other components within normal limits  PROTIME-INR - Abnormal; Notable for the following components:   Prothrombin Time 39.7 (*)    INR 4.1 (*)    All other components within normal limits  I-STAT CHEM 8, ED - Abnormal; Notable for the following components:   Glucose, Bld 151 (*)    All other components within normal limits  CULTURE, BLOOD (ROUTINE X 2)  CULTURE, BLOOD (ROUTINE X 2)  URINALYSIS, W/ REFLEX TO CULTURE (INFECTION  SUSPECTED)  LACTIC ACID, PLASMA  LACTIC ACID, PLASMA  ETHANOL  RAPID URINE DRUG SCREEN, HOSP PERFORMED  MENINGITIS/ENCEPHALITIS PANEL (CSF)  Radiology CT VENOGRAM HEAD Result Date: 04/10/2024 CLINICAL DATA:  Initial evaluation for failure to thrive. EXAM: CT VENOGRAM HEAD TECHNIQUE: Venographic phase images of the brain were obtained following the administration of intravenous contrast. Multiplanar reformats and maximum intensity projections were generated. RADIATION DOSE REDUCTION: This exam was performed according to the departmental dose-optimization program which includes automated exposure control, adjustment of the mA and/or kV according to patient size and/or use of iterative reconstruction technique. CONTRAST:  75mL OMNIPAQUE  IOHEXOL  350 MG/ML SOLN COMPARISON:  Comparison made with prior MRI from 03/24/2024 as well as multiple previous exams. FINDINGS: Brain: Diffuse loss of cortical sulcation throughout the brain, similar to previous exams. Hypodensity involving the subcortical parafalcine left frontal lobe, consistent with sequelae of subacute venous ischemic changes, similar to prior. No new large vessel territory infarct. No acute intracranial hemorrhage. No mass lesion or midline shift. Ventricles are somewhat small, but with mild temporal horn dilatation, stable from previous exams. Basilar cisterns are effaced. Cerebellar tonsils are low lying extending up to approximately 11 mm below the foramen magnum, similar to prior. Secondary crowding at the cervicomedullary junction. Vascular: No abnormal hyperdense vessel seen prior to contrast administration. Following contrast administration, there is persistent dural venous sinus thrombosis involving the posterior aspect of the superior sagittal sinus to the torcula. Scattered clot present within the transverse and sigmoid  sinuses bilaterally. Straight sinus, vein of Galen, and internal cerebral veins appear largely patent. Secondary diffuse venous congestion within the brain. Overall, appearance is similar to prior. Skull: Scalp soft tissues demonstrate no acute finding. Calvarium intact. Sinuses/Orbits: Globes orbital soft tissues within normal limits. Paranasal sinuses are largely clear. Trace chronic appearing left mastoid effusion. Right mastoid air cells are clear. Other: None. IMPRESSION: 1. Persistent dural venous sinus thrombosis involving the posterior aspect of the superior sagittal sinus, with involvement of the transverse and sigmoid sinuses bilaterally. Overall, appearance is similar as compared to previous exams. 2. Diffuse loss of cortical sulcation throughout the brain, with effacement of the basilar cisterns and low lying cerebellar tonsils, similar to previous exams. 3. Hypodensity involving the subcortical parafalcine left frontal lobe, consistent with sequelae of prior venous ischemic changes, similar to prior. No visible new ischemic changes identified. No intracranial hemorrhage. Electronically Signed   By: Virgia Griffins M.D.   On: 04/10/2024 21:40   DG Chest Port 1 View Result Date: 04/10/2024 CLINICAL DATA:  Questionable sepsis EXAM: PORTABLE CHEST 1 VIEW COMPARISON:  Chest x-ray 03/09/2024 FINDINGS: The heart size and mediastinal contours are within normal limits. Both lungs are clear. The visualized skeletal structures are unremarkable. There is gaseous distention of the stomach. IMPRESSION: No active disease. Electronically Signed   By: Tyron Gallon M.D.   On: 04/10/2024 19:31    Pertinent labs & imaging results that were available during my care of the patient were reviewed by me and considered in my medical decision making (see MDM for details).  Medications Ordered in ED Medications  ampicillin (OMNIPEN) 2 g in sodium chloride  0.9 % 100 mL IVPB (has no administration in time range)   Vancomycin  (VANCOCIN ) 1,250 mg in sodium chloride  0.9 % 250 mL IVPB (has no administration in time range)  vancomycin  (VANCOREADY) IVPB 750 mg/150 mL (has no administration in time range)  cefTRIAXone  (ROCEPHIN ) 2 g in sodium chloride  0.9 % 100 mL IVPB (0 g Intravenous Stopped 04/10/24 2100)  iohexol  (OMNIPAQUE ) 350 MG/ML injection 75 mL (75 mLs Intravenous Contrast Given 04/10/24 2116)  Procedures .Critical Care  Performed by: Nathanael Baker, DO Authorized by: Nathanael Baker, DO   Critical care provider statement:    Critical care time (minutes):  35   Critical care was necessary to treat or prevent imminent or life-threatening deterioration of the following conditions:  CNS failure or compromise   Critical care was time spent personally by me on the following activities:  Development of treatment plan with patient or surrogate, discussions with consultants, evaluation of patient's response to treatment, examination of patient, ordering and review of laboratory studies, ordering and review of radiographic studies, ordering and performing treatments and interventions, pulse oximetry, re-evaluation of patient's condition and review of old charts   (including critical care time)  Medical Decision Making / ED Course   This patient presents to the ED for concern of altered mental status, this involves an extensive number of treatment options, and is a complaint that carries with it a high risk of complications and morbidity.  The differential diagnosis includes meningitis, CVA, sinus venous thrombus, consider sepsis.  MDM: Upon arrival to the emergency room, patient laying on stretcher, does not appear to be in any acute distress.  Patient is deaf at baseline.  Reportedly had a viral meningitis on at the end of March of this year.  Patient has had multiple  readmissions for cerebral edema, confusion.  Patient currently protecting his airway.  Do not believe any emergent airway intervention is required at this time.  Patient's INR elevated, contraindication for emergency department LP.  Will empirically cover the the patient with antibiotics.  Patient may require IR LP if symptoms do not improve.  CT venogram negative for any acute process.  Patient's sodium normal.  Blood work shows a mild leukocytosis but otherwise normal.  Will admit patient to hospitalist for altered mental status of unclear origin.  Additional history obtained: -Additional history obtained from EMS -External records from outside source obtained and reviewed including: Chart review including previous notes, labs, imaging, consultation notes   Lab Tests: -I ordered, reviewed, and interpreted labs.   The pertinent results include:   Labs Reviewed  COMPREHENSIVE METABOLIC PANEL WITH GFR - Abnormal; Notable for the following components:      Result Value   Glucose, Bld 146 (*)    ALT 46 (*)    All other components within normal limits  CBC WITH DIFFERENTIAL/PLATELET - Abnormal; Notable for the following components:   WBC 15.0 (*)    Neutro Abs 13.3 (*)    All other components within normal limits  PROTIME-INR - Abnormal; Notable for the following components:   Prothrombin Time 39.7 (*)    INR 4.1 (*)    All other components within normal limits  I-STAT CHEM 8, ED - Abnormal; Notable for the following components:   Glucose, Bld 151 (*)    All other components within normal limits  CULTURE, BLOOD (ROUTINE X 2)  CULTURE, BLOOD (ROUTINE X 2)  URINALYSIS, W/ REFLEX TO CULTURE (INFECTION SUSPECTED)  LACTIC ACID, PLASMA  LACTIC ACID, PLASMA  ETHANOL  RAPID URINE DRUG SCREEN, HOSP PERFORMED  MENINGITIS/ENCEPHALITIS PANEL (CSF)         Imaging Studies ordered: I ordered imaging studies including CT venogram, chest x-ray I independently visualized and interpreted  imaging. I agree with the radiologist interpretation   Medicines ordered and prescription drug management: Meds ordered this encounter  Medications   cefTRIAXone  (ROCEPHIN ) 2 g in sodium chloride  0.9 % 100 mL IVPB    Antibiotic Indication::  Meningitis   iohexol  (OMNIPAQUE ) 350 MG/ML injection 75 mL   ampicillin (OMNIPEN) 2 g in sodium chloride  0.9 % 100 mL IVPB    Antibiotic Indication::   Meningitis   Vancomycin  (VANCOCIN ) 1,250 mg in sodium chloride  0.9 % 250 mL IVPB    Indication::   Other Indication (list below)    Other Indication::   meningitis   vancomycin  (VANCOREADY) IVPB 750 mg/150 mL    Indication::   Other Indication (list below)    Other Indication::   Meningitis    -I have reviewed the patients home medicines and have made adjustments as needed  Critical interventions Management of altered mental status, encephalitis  Cardiac Monitoring: The patient was maintained on a cardiac monitor.  I personally viewed and interpreted the cardiac monitored which showed an underlying rhythm of: Normal sinus rhythm  Social Determinants of Health:  Factors impacting patients care include: Multiple medical comorbidities including cerebral edema, dural sinus venous thrombus   Reevaluation: After the interventions noted above, I reevaluated the patient and found that they have :improved  Co morbidities that complicate the patient evaluation  Past Medical History:  Diagnosis Date   Deaf          Final Clinical Impression(s) / ED Diagnoses Final diagnoses:  Encephalitis  Altered mental status, unspecified altered mental status type     @PCDICTATION @    Afton Horse T, DO 04/10/24 2156

## 2024-04-11 ENCOUNTER — Inpatient Hospital Stay (HOSPITAL_COMMUNITY)

## 2024-04-11 ENCOUNTER — Other Ambulatory Visit: Payer: Self-pay

## 2024-04-11 DIAGNOSIS — G9340 Encephalopathy, unspecified: Secondary | ICD-10-CM | POA: Diagnosis not present

## 2024-04-11 DIAGNOSIS — I776 Arteritis, unspecified: Secondary | ICD-10-CM

## 2024-04-11 DIAGNOSIS — G08 Intracranial and intraspinal phlebitis and thrombophlebitis: Secondary | ICD-10-CM

## 2024-04-11 DIAGNOSIS — A879 Viral meningitis, unspecified: Secondary | ICD-10-CM

## 2024-04-11 DIAGNOSIS — A179 Tuberculosis of nervous system, unspecified: Secondary | ICD-10-CM | POA: Diagnosis not present

## 2024-04-11 DIAGNOSIS — G936 Cerebral edema: Secondary | ICD-10-CM | POA: Diagnosis not present

## 2024-04-11 DIAGNOSIS — Z7901 Long term (current) use of anticoagulants: Secondary | ICD-10-CM

## 2024-04-11 LAB — RAPID URINE DRUG SCREEN, HOSP PERFORMED
Amphetamines: NOT DETECTED
Barbiturates: NOT DETECTED
Benzodiazepines: NOT DETECTED
Cocaine: NOT DETECTED
Opiates: NOT DETECTED
Tetrahydrocannabinol: NOT DETECTED

## 2024-04-11 LAB — URINALYSIS, W/ REFLEX TO CULTURE (INFECTION SUSPECTED)
Bacteria, UA: NONE SEEN
Bilirubin Urine: NEGATIVE
Glucose, UA: NEGATIVE mg/dL
Hgb urine dipstick: NEGATIVE
Ketones, ur: NEGATIVE mg/dL
Leukocytes,Ua: NEGATIVE
Nitrite: NEGATIVE
Protein, ur: NEGATIVE mg/dL
Specific Gravity, Urine: >1.046 — ABNORMAL HIGH (ref 1.005–1.030)
pH: 7 (ref 5.0–8.0)

## 2024-04-11 LAB — CORTISOL-AM, BLOOD: Cortisol - AM: 16.4 ug/dL (ref 6.7–22.6)

## 2024-04-11 LAB — LACTIC ACID, PLASMA: Lactic Acid, Venous: 1.2 mmol/L (ref 0.5–1.9)

## 2024-04-11 LAB — SODIUM
Sodium: 141 mmol/L (ref 135–145)
Sodium: 143 mmol/L (ref 135–145)
Sodium: 145 mmol/L (ref 135–145)

## 2024-04-11 LAB — PROTIME-INR
INR: 5.6 (ref 0.8–1.2)
Prothrombin Time: 50.8 s — ABNORMAL HIGH (ref 11.4–15.2)

## 2024-04-11 LAB — MRSA NEXT GEN BY PCR, NASAL: MRSA by PCR Next Gen: NOT DETECTED

## 2024-04-11 LAB — ETHANOL: Alcohol, Ethyl (B): <15 mg/dL (ref <15)

## 2024-04-11 MED ORDER — VANCOMYCIN HCL 1.25 G IV SOLR: 1250.0000 mg | INTRAVENOUS | Status: DC

## 2024-04-11 MED ORDER — DEXTROSE 5 % IV SOLN
600.0000 mg | Freq: Three times a day (TID) | INTRAVENOUS | Status: DC
  Administered 2024-04-11: 600 mg via INTRAVENOUS
  Filled 2024-04-11 (×6): qty 12

## 2024-04-11 MED ORDER — SODIUM CHLORIDE 3 % IV SOLN
INTRAVENOUS | Status: DC
  Filled 2024-04-11 (×3): qty 500

## 2024-04-11 MED ORDER — SODIUM CHLORIDE 0.9 % IV SOLN
2.0000 g | Freq: Four times a day (QID) | INTRAVENOUS | Status: DC
  Filled 2024-04-11: qty 2000

## 2024-04-11 MED ORDER — ORAL CARE MOUTH RINSE
15.0000 mL | OROMUCOSAL | Status: DC
  Administered 2024-04-11 – 2024-04-16 (×18): 15 mL via OROMUCOSAL

## 2024-04-11 MED ORDER — ORAL CARE MOUTH RINSE: 15.0000 mL | OROMUCOSAL | Status: DC | PRN

## 2024-04-11 MED ORDER — TUBERCULIN PPD 5 UNIT/0.1ML ID SOLN
5.0000 [IU] | Freq: Once | INTRADERMAL | Status: AC
  Administered 2024-04-11: 5 [IU] via INTRADERMAL
  Filled 2024-04-11: qty 0.1

## 2024-04-11 MED ORDER — GADOBUTROL 1 MMOL/ML IV SOLN
6.0000 mL | Freq: Once | INTRAVENOUS | Status: AC | PRN
  Administered 2024-04-11: 6 mL via INTRAVENOUS

## 2024-04-11 MED ORDER — VALPROATE SODIUM 100 MG/ML IV SOLN
125.0000 mg | Freq: Four times a day (QID) | INTRAVENOUS | Status: DC
  Administered 2024-04-11 – 2024-04-12 (×5): 125 mg via INTRAVENOUS
  Filled 2024-04-11 (×9): qty 1.25

## 2024-04-11 MED ORDER — CHLORHEXIDINE GLUCONATE CLOTH 2 % EX PADS
6.0000 | MEDICATED_PAD | Freq: Every day | CUTANEOUS | Status: DC
  Administered 2024-04-11 – 2024-04-12 (×2): 6 via TOPICAL

## 2024-04-11 MED ORDER — LABETALOL HCL 5 MG/ML IV SOLN
10.0000 mg | INTRAVENOUS | Status: DC | PRN
  Administered 2024-04-13 (×3): 10 mg via INTRAVENOUS
  Filled 2024-04-11 (×4): qty 4

## 2024-04-11 MED ORDER — LACTATED RINGERS IV SOLN: INTRAVENOUS | Status: DC

## 2024-04-11 MED ORDER — SODIUM CHLORIDE 0.9 % IV SOLN: INTRAVENOUS | Status: DC

## 2024-04-11 MED ORDER — SODIUM CHLORIDE 0.9 % IV SOLN
2.0000 g | INTRAVENOUS | Status: DC
  Administered 2024-04-11 (×2): 2 g via INTRAVENOUS
  Filled 2024-04-11 (×3): qty 2000

## 2024-04-11 MED ORDER — SODIUM CHLORIDE 0.9 % IV SOLN
2.0000 g | Freq: Two times a day (BID) | INTRAVENOUS | Status: DC
  Administered 2024-04-11: 2 g via INTRAVENOUS
  Filled 2024-04-11: qty 20

## 2024-04-11 NOTE — Progress Notes (Signed)
  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  MRI noting severe sequelae of dural venous sinus thrombosis, cerebral edema with increasing intracranial mass effect with downward central herniation.  Evaluated by neurology with recommendations to transition to ICU.  Consult to PCCM placed.     Author: Jodeane Mulligan, DO 04/11/2024 8:14 AM  For on call review [email or web address].

## 2024-04-11 NOTE — Evaluation (Signed)
 Clinical/Bedside Swallow Evaluation Patient Details  Name: Samuel Warren MRN: [id number] Date of Birth: 29-Apr-1991  Today's Date: 04/11/2024 Time: SLP Start Time (ACUTE ONLY): 1410 SLP Stop Time (ACUTE ONLY): 1425 SLP Time Calculation (min) (ACUTE ONLY): 15 min  Past Medical History:  Past Medical History:  Diagnosis Date   Deaf    Past Surgical History:  Past Surgical History:  Procedure Laterality Date   IR ANGIO INTRA EXTRACRAN SEL COM CAROTID INNOMINATE BILAT MOD SED  02/02/2024   IR ANGIO INTRA EXTRACRAN SEL COM CAROTID INNOMINATE BILAT MOD SED  03/09/2024   IR CT HEAD LTD  03/09/2024   IR THROMBECT VENO MECH MOD SED  02/02/2024   IR THROMBECT VENO MECH MOD SED  03/09/2024   IR VENO SAGITTAL SINUS  03/09/2024   IR VENO/JUGULAR LEFT  02/02/2024   RADIOLOGY WITH ANESTHESIA N/A 02/02/2024   Procedure: RADIOLOGY WITH ANESTHESIA;  Surgeon: Luellen Sages, MD;  Location: [facility] OR;  Service: Radiology;  Laterality: N/A;   RADIOLOGY WITH ANESTHESIA N/A 03/09/2024   Procedure: RADIOLOGY WITH ANESTHESIA;  Surgeon: Luellen Sages, MD;  Location: [facility] OR;  Service: Radiology;  Laterality: N/A;   HPI:  Patient is a 33 y.o. male with PMH: childhood acquired deafness (knows limited ASL), recent cerebral vein thrombosis. 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 He was discharged to CIR. Since discharging home from CIR, patient has reportedly been having increasing generalized weakness, lethargy and headache with poor PO intake. He became less and less responsive and he was brought to ED on 04/10/24. 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 He has been NPO awaiting SLP swallow evaluation.    Assessment / Plan / Recommendation  Clinical Impression  Patient is  presenting with clinical s/s of dysphagia as per this bedside swallow evaluation. Although he did respond to SLP modeling to open mouth, stick out tongue, etc, he did not initiate any UE movement, (per RN in room he has been moving left UE spontaneously but for other extremeties she needs to cue him) and kept his head turned far over to the right. He accepted spoon sips of water into oral cavity with poor bilabial movements and seal, reduced oral manipulation, suspected swallow initiation delay and decreased hyolaryngeal elevation.  SLP is recommending continue NPO status at this time and will follow for PO readiness. He may have spoon sips of water PRN after oral care. SLP Visit Diagnosis: Dysphagia, unspecified (R13.10)    Aspiration Risk  Moderate aspiration risk;Mild aspiration risk    Diet Recommendation NPO;Other (Comment) (spoon sips water)    Liquid Administration via: Spoon Medication Administration: Via alternative means    Other  Recommendations Oral Care Recommendations: Oral care BID;Oral care prior to ice chip/H20    Recommendations for follow up therapy are one component of a multi-disciplinary discharge planning process, led by the attending physician.  Recommendations may be updated based on patient status, additional functional criteria and insurance authorization.  Follow up Recommendations Other (comment) (TBD)      Assistance Recommended at Discharge    Functional Status Assessment Patient has had a recent decline in their functional status and demonstrates the ability to make significant improvements in function in a reasonable and predictable amount of time.  Frequency and Duration min 2x/week  2 weeks  Prognosis Prognosis for improved oropharyngeal function: Good      Swallow Study   General Date of Onset: 04/10/24 HPI: Patient is a 33 y.o. male with PMH: childhood acquired deafness (knows limited ASL), recent cerebral vein thrombosis. He was diagnosed  with central venous sinus thrombosis in April 2025 s/p thrombectomy with course complicated by viral meningitis and cerebral infarction. He was discharged to CIR. Since discharging home from CIR, patient has reportedly been having increasing generalized weakness, lethargy and headache with poor PO intake. He became less and less responsive and he was brought to ED on 04/10/24. 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 He has been NPO awaiting SLP swallow evaluation. Type of Study: Bedside Swallow Evaluation Previous Swallow Assessment: BSE's during previous admissions in April and May of 2025 Diet Prior to this Study: NPO Temperature Spikes Noted: No Respiratory Status: Room air History of Recent Intubation: No Behavior/Cognition: Alert;Pleasant mood;Cooperative;Requires cueing Oral Cavity Assessment: Within Functional Limits Oral Care Completed by SLP: Yes Oral Cavity - Dentition: Adequate natural dentition Self-Feeding Abilities: Total assist Baseline Vocal Quality: Not observed Volitional Cough: Cognitively unable to elicit Volitional Swallow: Unable to elicit    Oral/Motor/Sensory Function Overall Oral Motor/Sensory Function: Other (comment) (unable to follow directions adequately)   Ice Chips     Thin Liquid Thin Liquid: Impaired Presentation: Spoon Oral Phase Impairments: Reduced labial seal Oral Phase Functional Implications: Prolonged oral transit Pharyngeal  Phase Impairments: Suspected delayed Swallow;Decreased hyoid-laryngeal movement    Nectar Thick Nectar Thick Liquid: Not tested   Honey Thick Honey Thick Liquid: Not tested   Puree Puree: Not tested   Solid     Solid: Not tested     Jacqualine Mater, MA, CCC-SLP Speech Therapy

## 2024-04-11 NOTE — ED Notes (Signed)
 Patient transported to MRI

## 2024-04-11 NOTE — Consult Note (Signed)
 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  Patient was initially discharged with Eliquis  but due to failed treatment, it was switched to Coumadin , patient was discharged to rehab Per patient's family since he got discharged from the hospital he has been having increasing generalized weakness, lethargy and headache with poor p.o. intake, over the last few days he has becoming less and less responsive, so he was brought to the emergency department.  MRI brain showing diffuse cerebral edema with early central herniation syndrome, neurology was consulted, patient was started on antibiotics for possible bacterial meningitis, LP was opted out due to high INR and early herniation syndrome on MRI.  PCCM was consulted for aspiration and admission to ICU  Pertinent  Medical History  Dural venous sinus thrombosis Congenital deafness  Significant Hospital Events: Including procedures, antibiotic start and stop dates in addition to other pertinent events     Interim History / Subjective:  As above  Objective    Blood pressure (!) 144/98, pulse 89, temperature 98.8 F (37.1 C), temperature source Axillary, resp. rate 18, SpO2 96%.        Intake/Output Summary (Last 24 hours) at 04/11/2024 0931 Last data filed at 04/11/2024 4098 Gross per 24 hour  Intake 2287.43 ml  Output --  Net 2287.43 ml   There were no vitals filed for this visit.  Examination: General: Acute on chronically ill-appearing male, lying on the bed HEENT: [place]/AT, eyes anicteric.  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 on anticoagulation with Coumadin  Supratherapeutic INR  Continue neuro watch Continue broad-spectrum antibiotics with vancomycin , ceftriaxone , ampicillin and acyclovir  Continue IV fluid Cultures have been sent Per neurology goal QuantiFERON test has been sent PPD test is ordered Keep head of the bed elevated Avoid seizure, keep euthermia Patient is being started on hypertonic saline Neurology is following Hold Coumadin  considering patient is supratherapeutic INR of 5.1 Admit to ICU  Best Practice (right click and "Reselect all SmartList Selections" daily)   Diet/type: NPO DVT prophylaxis other on Coumadin  with supratherapeutic INR Pressure ulcer(s): Please see nursing notes GI prophylaxis: N/A Lines: N/A Foley:  N/A Code Status:  full code Last date of multidisciplinary goals of care discussion [pending]  Labs   CBC: Recent Labs  Lab 04/10/24 1905 04/10/24 1916  WBC 15.0*  --   NEUTROABS 13.3*  --   HGB 13.8 15.0  HCT 43.2 44.0  MCV 83.6  --   PLT 275  --     Basic Metabolic Panel: Recent Labs  Lab 04/10/24 1905 04/10/24 1916 04/11/24 0833  NA 138 141 141  K 3.6 3.7  --   CL 103  104  --   CO2 23  --   --   GLUCOSE 146* 151*  --   BUN 9 10  --   CREATININE 0.81 0.80  --   CALCIUM  10.2  --   --    GFR: Estimated Creatinine Clearance: 108.9 mL/min (by C-G formula based on SCr of 0.8  mg/dL). Recent Labs  Lab 04/10/24 1905 04/10/24 2129 04/11/24 0658  WBC 15.0*  --   --   LATICACIDVEN  --  2.8* 1.2    Liver Function Tests: Recent Labs  Lab 04/10/24 1905  AST 19  ALT 46*  ALKPHOS 57  BILITOT 0.4  PROT 7.9  ALBUMIN 4.5   No results for input(s): "LIPASE", "AMYLASE" in the last 168 hours. No results for input(s): "AMMONIA" in the last 168 hours.  ABG    Component Value Date/Time   PHART 7.44 03/09/2024 1250   PCO2ART 26 (L) 03/09/2024 1250   PO2ART 100 03/09/2024 1250   HCO3 17.7 (L) 03/09/2024 1250   TCO2 23 04/10/2024 1916   ACIDBASEDEF 4.8 (H) 03/09/2024 1250   O2SAT 99.5 03/09/2024 1250     Coagulation Profile: Recent Labs  Lab 04/06/24 0453 04/10/24 1905 04/11/24 0512  INR 2.6* 4.1* 5.6*    Cardiac Enzymes: No results for input(s): "CKTOTAL", "CKMB", "CKMBINDEX", "TROPONINI" in the last 168 hours.  HbA1C: Hgb A1c MFr Bld  Date/Time Value Ref Range Status  01/26/2024 08:47 PM 5.7 (H) 4.8 - 5.6 % Final    Comment:    (NOTE) Pre diabetes:          5.7%-6.4%  Diabetes:              >6.4%  Glycemic control for   <7.0% adults with diabetes     CBG: No results for input(s): "GLUCAP" in the last 168 hours.  Review of Systems:   Unable to obtain due to patient with congenital deafness  Past Medical History:  He,  has a past medical history of Deaf.   Surgical History:   Past Surgical History:  Procedure Laterality Date   IR ANGIO INTRA EXTRACRAN SEL COM CAROTID INNOMINATE BILAT MOD SED  02/02/2024   IR ANGIO INTRA EXTRACRAN SEL COM CAROTID INNOMINATE BILAT MOD SED  03/09/2024   IR CT HEAD LTD  03/09/2024   IR THROMBECT VENO MECH MOD SED  02/02/2024   IR THROMBECT VENO MECH MOD SED  03/09/2024   IR VENO SAGITTAL SINUS  03/09/2024   IR VENO/JUGULAR LEFT  02/02/2024   RADIOLOGY WITH ANESTHESIA N/A 02/02/2024   Procedure: RADIOLOGY WITH ANESTHESIA;  Surgeon: Luellen Sages, MD;  Location: [facility] OR;  Service: Radiology;  Laterality:  N/A;   RADIOLOGY WITH ANESTHESIA N/A 03/09/2024   Procedure: RADIOLOGY WITH ANESTHESIA;  Surgeon: Luellen Sages, MD;  Location: [facility] OR;  Service: Radiology;  Laterality: N/A;     Social History:   reports that he has never smoked. He has never used smokeless tobacco. He reports that he does not currently use alcohol. He reports that he does not currently use drugs.   Family History:  His family history is not on file.   Allergies No Known Allergies   Home Medications  Prior to Admission medications   Medication Sig Start Date End Date Taking? Authorizing Provider  acetaminophen  (TYLENOL ) 325 MG tablet Take 2 tablets (650 mg total) by mouth every 4 (four) hours as needed for mild pain (pain score 1-3) (or temp > 37.5 C (99.5 F)). 03/18/24  [name], [name], MD  amitriptyline  (ELAVIL ) 25 MG tablet Take 2 tablets (50 mg total) by mouth at bedtime. 04/06/24   [name], [name], PA-C  atorvastatin  (LIPITOR ) 80 MG tablet Take 1 tablet (80 mg total) by mouth daily. 04/06/24 05/07/24  [name], [name], PA-C  divalproex  (DEPAKOTE ) 250 MG DR tablet Take 1 tablet (250 mg total) by mouth every 12 (twelve) hours. 04/06/24   [name], [name], PA-C  lidocaine  (LIDODERM ) 5 % Place 2 patches onto the skin daily. Remove & Discard patch within 12 hours or as directed by MD 04/06/24   [name], [name], PA-C  methocarbamol  (ROBAXIN ) 750 MG tablet Take 1 tablet (750 mg total) by mouth every 6 (six) hours as needed for muscle spasms. 04/06/24   [name], [name], PA-C  midodrine  (PROAMATINE ) 5 MG tablet Take 1 tablet (5 mg total) by mouth 3 (three) times daily with meals. 04/06/24   [name], Daniel J, PA-C  Multiple Vitamin (MULTIVITAMIN WITH MINERALS) TABS tablet Take 1 tablet by mouth daily. 04/05/24   [name], [name], PA-C  pantoprazole  (PROTONIX ) 40 MG tablet Take 1 tablet (40 mg total) by mouth daily. 04/06/24   [name], [name], PA-C  polyethylene glycol (MIRALAX  / GLYCOLAX ) 17 g packet Take 17 g  by mouth daily as needed. 04/05/24   [name], [name], PA-C  tiZANidine  (ZANAFLEX ) 4 MG tablet Take 1 tablet (4 mg total) by mouth at bedtime. 04/06/24   [name], [name], PA-C  warfarin (COUMADIN ) 3 MG tablet Take 1 tablet (3 mg total) by mouth daily at 4 PM. 04/07/24   [name], [name], PA-C     Critical care time:      The patient is critically ill due to acute encephalopathy/rule out meningitis.  CVTS.  Critical care was necessary to treat or prevent imminent or life-threatening deterioration.  Critical care was time spent personally by me on the following activities: development of treatment plan with patient and/or surrogate as well as nursing, discussions with consultants, evaluation of patient's response to treatment, examination of patient, obtaining history from patient or surrogate, ordering and performing treatments and interventions, ordering and review of laboratory studies, ordering and review of radiographic studies, pulse oximetry, re-evaluation of patient's condition and participation in multidisciplinary rounds.   During this encounter critical care time was devoted to patient care services described in this note for 42 minutes.     Trevor Fudge, MD [facility] See Amion for pager If no response to pager, please call [phone] until 7pm After 7pm, Please call E-link [phone]

## 2024-04-11 NOTE — Consult Note (Addendum)
 NEUROLOGY CONSULT NOTE   Date of service: April 11, 2024 Patient Name: Samuel Warren MRN:  [id number] DOB:  August 28, 1991 Chief Complaint: Headache and lethargy Requesting Provider: Theodora Fish, MD  History of Present Illness  Samuel Warren is a 33 y.o. 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 He had been discharged home on Wednesday from rehab and has had decreased PO intake of fluids and food followed by lethargy. On Friday he stopped eating and drinking altogether, seemed warm to the touch per family and also had been pointing to his head with a pained expression on his face. On arrival to the ED he continued to be lethargic, with skin to face and shoulders mildly flushed and warm to the touch with subtle diaphoresis. He has been started on ABX empirically for presumed meningitis. Unable to perform LP due to anticoagulation.     ROS  As per HPI. Unable to obtain a detailed ROS due to patient's deafness.   Past History   Past Medical History:  Diagnosis Date   Deaf     Past Surgical History:  Procedure Laterality Date   IR ANGIO INTRA EXTRACRAN SEL COM CAROTID INNOMINATE BILAT MOD SED  02/02/2024   IR ANGIO INTRA EXTRACRAN SEL COM CAROTID INNOMINATE BILAT MOD SED  03/09/2024   IR CT HEAD LTD  03/09/2024   IR THROMBECT VENO MECH MOD SED  02/02/2024   IR THROMBECT VENO MECH MOD SED  03/09/2024   IR VENO SAGITTAL SINUS  03/09/2024   IR VENO/JUGULAR LEFT  02/02/2024   RADIOLOGY WITH ANESTHESIA N/A 02/02/2024   Procedure: RADIOLOGY WITH ANESTHESIA;  Surgeon: Luellen Sages, MD;  Location: [facility] OR;  Service: Radiology;  Laterality: N/A;   RADIOLOGY WITH  ANESTHESIA N/A 03/09/2024   Procedure: RADIOLOGY WITH ANESTHESIA;  Surgeon: Luellen Sages, MD;  Location: [facility] OR;  Service: Radiology;  Laterality: N/A;    Family History: No family history on file.  Social History  reports that he has never smoked. He has never used smokeless tobacco. He reports that he does not currently use alcohol. He reports that he does not currently use drugs.  No Known Allergies  Medications   Current Facility-Administered Medications:    0.9 %  sodium chloride  infusion, , Intravenous, Continuous, Acheampong, Patricia Boon, MD, Last Rate: 75 mL/hr at 04/10/24 2253, New Bag at 04/10/24 2253   acetaminophen  (TYLENOL ) tablet 650 mg, 650 mg, Oral, Q6H PRN **OR** acetaminophen  (TYLENOL ) suppository 650 mg, 650 mg, Rectal, Q6H PRN, Acheampong, Patricia Boon, MD   albuterol  (PROVENTIL ) (2.5 MG/3ML) 0.083% nebulizer solution 2.5 mg, 2.5 mg, Nebulization, Q2H PRN, Acheampong, Patricia Boon, MD   amitriptyline  (ELAVIL ) tablet 50 mg, 50 mg, Oral, QHS, Acheampong, Peter K, MD   atorvastatin  (LIPITOR ) tablet 80 mg, 80 mg, Oral, Daily, Acheampong, Peter K, MD   divalproex  (DEPAKOTE ) DR tablet 250 mg, 250 mg, Oral, Q12H, Acheampong, Peter K, MD   lactated ringers  infusion, 150 mL/hr, Intravenous, Continuous, Acheampong, Patricia Boon, MD   methocarbamol  (ROBAXIN ) tablet 750 mg, 750 mg, Oral, Q6H PRN, Acheampong, Patricia Boon, MD   midodrine  (PROAMATINE ) tablet 5 mg, 5 mg, Oral, TID WC, Acheampong, Peter K, MD   multivitamin with minerals tablet 1 tablet, 1 tablet, Oral,  Daily, Acheampong, Peter K, MD   pantoprazole  (PROTONIX ) EC tablet 40 mg, 40 mg, Oral, Daily, Acheampong, Peter K, MD   polyethylene glycol (MIRALAX  / GLYCOLAX ) packet 17 g, 17 g, Oral, Daily PRN, Acheampong, Peter K, MD   Vancomycin  (VANCOCIN ) 1,250 mg in sodium chloride  0.9 % 250 mL IVPB, 1,250 mg, Intravenous, Once, Trinidad Funk, [facility], Last Rate: 166.7 mL/hr at 04/10/24 2353, 1,250 mg at 04/10/24 2353   vancomycin  (VANCOREADY) IVPB  750 mg/150 mL, 750 mg, Intravenous, Q8H, Trinidad Funk, [facility]  Current Outpatient Medications:    acetaminophen  (TYLENOL ) 325 MG tablet, Take 2 tablets (650 mg total) by mouth every 4 (four) hours as needed for mild pain (pain score 1-3) (or temp > 37.5 C (99.5 F))., Disp: , Rfl:    amitriptyline  (ELAVIL ) 25 MG tablet, Take 2 tablets (50 mg total) by mouth at bedtime., Disp: 60 tablet, Rfl: 0   atorvastatin  (LIPITOR ) 80 MG tablet, Take 1 tablet (80 mg total) by mouth daily., Disp: 30 tablet, Rfl: 0   divalproex  (DEPAKOTE ) 250 MG DR tablet, Take 1 tablet (250 mg total) by mouth every 12 (twelve) hours., Disp: 60 tablet, Rfl: 0   lidocaine  (LIDODERM ) 5 %, Place 2 patches onto the skin daily. Remove & Discard patch within 12 hours or as directed by MD, Disp: 30 patch, Rfl: 0   methocarbamol  (ROBAXIN ) 750 MG tablet, Take 1 tablet (750 mg total) by mouth every 6 (six) hours as needed for muscle spasms., Disp: 60 tablet, Rfl: 0   midodrine  (PROAMATINE ) 5 MG tablet, Take 1 tablet (5 mg total) by mouth 3 (three) times daily with meals., Disp: 90 tablet, Rfl: 0   Multiple Vitamin (MULTIVITAMIN WITH MINERALS) TABS tablet, Take 1 tablet by mouth daily., Disp: , Rfl:    pantoprazole  (PROTONIX ) 40 MG tablet, Take 1 tablet (40 mg total) by mouth daily., Disp: 30 tablet, Rfl: 0   polyethylene glycol (MIRALAX  / GLYCOLAX ) 17 g packet, Take 17 g by mouth daily as needed., Disp: , Rfl:    tiZANidine  (ZANAFLEX ) 4 MG tablet, Take 1 tablet (4 mg total) by mouth at bedtime., Disp: 30 tablet, Rfl: 0   warfarin (COUMADIN ) 3 MG tablet, Take 1 tablet (3 mg total) by mouth daily at 4 PM., Disp: 30 tablet, Rfl: 0  Vitals   Vitals:   04/10/24 2300 04/10/24 2315 04/10/24 2330 04/10/24 2354  BP: (!) 136/92 (!) 125/90 (!) 127/92   Pulse: 85 91 83   Resp: 13 11 11    Temp:    99 F (37.2 C)  TempSrc:    Tympanic  SpO2: 97% 98% 97%     There is no height or weight on file to calculate BMI.  Physical Exam    Constitutional: Ill-appearing male appearing his stated age  Psych: Blunted affect Eyes: No scleral injection.  HENT: Moderate nuchal rigidity is noted. There is pain with passive neck flexion and rotation.  Head: Normocephalic. Skin is warm to the touch and slightly flushed as well as subtly diaphoretic Respiratory: Effort normal, non-labored breathing.   Neurologic Examination   Mental Status: Lethargic. Due to his deafness and lethargy unable to test for orientation (not signing as he had been at initial presentation in March). Will weakly follow some pantomimed commands. Will grip hands. Will track examiner's moving face.  Cranial Nerves: II: Blinks to threat in bilateral temporal fields. PERRL  III,IV, VI: No ptosis. EOMI. No nystagmus V: Blinks with eyelid stimulation bilaterally.   VII: Does not smile  or grimace. In this context face is symmetric.  VIII: Severe congenital deafness IX,X: Gag reflex deferred XI: Head is midline XII: Does not protrude tongue to pantomiming.  Motor: BUE weakly grips examiner's hands and will weakly push-pull at elbows, but does not elevate antigravity. After passive elevation and release, arms drift back to the bed within 2 seconds.  BLE move equally and weakly to pinch and noxious plantar stimulation. Does not follow pantomimed commands.   Sensory: Reacts to noxious and gross touch x 4.  Deep Tendon Reflexes: 1+ and symmetric bilateral brachioradialis, biceps and patellae. Toes downgoing.  Cerebellar: Not following pantomimed commands for assessment  Gait: Deferred  Labs/Imaging/Neurodiagnostic studies   CBC:  Recent Labs  Lab Apr 17, 2024 1905 April 17, 2024 1916  WBC 15.0*  --   NEUTROABS 13.3*  --   HGB 13.8 15.0  HCT 43.2 44.0  MCV 83.6  --   PLT 275  --    Basic Metabolic Panel:  Lab Results  Component Value Date   NA 141 04/17/2024   K 3.7 04-17-24   CO2 23 04/17/2024   GLUCOSE 151 (H) Apr 17, 2024   BUN 10 17-Apr-2024   CREATININE  0.80 Apr 17, 2024   CALCIUM  10.2 04-17-2024   GFRNONAA >60 April 17, 2024   Lipid Panel:  Lab Results  Component Value Date   LDLCALC 58 03/08/2024   HgbA1c:  Lab Results  Component Value Date   HGBA1C 5.7 (H) 01/26/2024   Urine Drug Screen:     Component Value Date/Time   LABOPIA NONE DETECTED 02/02/2024 0852   COCAINSCRNUR NONE DETECTED 02/02/2024 0852   LABBENZ NONE DETECTED 02/02/2024 0852   AMPHETMU NONE DETECTED 02/02/2024 0852   THCU NONE DETECTED 02/02/2024 0852   LABBARB NONE DETECTED 02/02/2024 0852    Alcohol Level     Component Value Date/Time   ETH <15 03/08/2024 1829   INR  Lab Results  Component Value Date   INR 4.1 (HH) 2024-04-17   APTT  Lab Results  Component Value Date   APTT 41 (H) 03/18/2024   CSF from LP performted 01/25/24:    Prior RPR, VDRL, spotted fever testing negative Prior CSF PCR panel negative Prior hepatitis A, B and C testing negative Prior Ehrlichia chafeenensis testing negative HIV from March was negative  ASSESSMENT  33 y.o. 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 He had been discharged home on Wednesday from rehab and has had decreased PO intake of fluids and food followed by lethargy. On Friday he stopped eating and drinking altogether, seemed warm to the touch per family and also had been pointing to his head with a pained expression on his face. On arrival to the ED he continued to be lethargic, with skin to face and shoulders mildly flushed and warm to the touch with subtle diaphoresis. He has been started on ABX empirically for presumed meningitis. Unable to perform LP due to anticoagulation.  -  Exam reveals a lethargic patient who feels febrile to touch, with moderate nuchal rigidity and pain to head and neck movement. In the context of his chronic total deafness, he is altered based on inability to consistently follow pantomimed commands, supplemented by family input regarding his normal baseline consisting of an ability to  communicate with sign language and follow signed and pantomimed commands.  - CTV of head: Persistent dural venous sinus thrombosis involving the posterior aspect of the superior sagittal sinus, with involvement of the transverse and sigmoid sinuses bilaterally. Overall, appearance is similar as compared to previous exams. Diffuse loss of cortical sulcation throughout the brain, with effacement of the basilar cisterns and low lying cerebellar tonsils, similar to previous exams. Hypodensity involving the subcortical parafalcine left frontal lobe, consistent with sequelae of prior venous ischemic changes, similar to prior. No visible new ischemic changes identified. No intracranial hemorrhage. - Labs:  - INR supratherapeutic at 4.1 - Blood culture is pending.  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 Was diagnosed with a viral meningitis previously, but given the neutrophilic predominance on the prior CSF sample from March, a bacterial etiology not tested for with CSF PCR should also be considered - Lower on the DDx, but also possible, is an autoimmune disorder that could both cause CVST and a vasculitis. These include Behcet's disease, ANCA associated vasculitis, CNS lupus, antiphospholipid antibody syndrome and Sjogren's syndrome. A CSF with elevated WBC and protein (see above) could occur in a vasculitis and mimic the pattern seen with infection.  - Cerebral venous sinus thromboses, persistent. On anticoagulation.    RECOMMENDATIONS  -  STAT MRI brain w/wo contrast (ordered) - Based on CT findings, he is at risk for herniation if an LP is performed. Also cannot perform as he is anticoagulated.   - Empiric meningitis dose ABX with vancomycin , ceftriaxone , acyclovir  and ampicillin. Ampicillin is indicated due to his recent debility secondary to the cerebral venous sinus thromboses. I have consulted Pharmacy for addition of acyclovir  to his regimen.  - Adjust warfarin to INR of 2-3. Another option would be to switch to therapeutic Lovenox  or IV heparin  - Lupus panel, antiphospholipid antibody and ANCA panel (ordered) - Neurology will continue to follow  Addendum: MRI brain w/wo contrast: 1. Highly abnormal Brain MRI, which seems to demonstrate Severe Sequelae of dural venous sinus thrombosis (see #2) superimposed on an unknown pre-existing intracranial vascular anomaly; unusual diffusely increased intracranial vascular collaterals on the initial March MRI (with NO evidence of venous thrombosis on that day). 2. Cerebral edema and increasing intracranial mass effect with Downward, Central Herniation of the brain (sagittal image 12 today). DWI suggests widespread cytotoxic edema in both superior hemispheres: Centrum semiovale. And also late subacute to early chronic asymmetric ischemia in the left superior perirolandic area. A progressive intracranial hypotension was also considered but is felt highly unlikely in this clinical scenario. 3. No definite acute intracranial hemorrhage. No midline shift or ventriculomegaly. 4. Intermittent venous sinus thrombus in the dorsal superior sagittal sinus, medial transverse sinuses similar to CTV and MRI last month.   Addendum: - I have discussed the case with Radiology and reviewed the images. Although the appearance on MRI is not consistent with typical imaging patterns of CNS turberculosis, this is a possibility given the wide variation in potential imaging manifestations of CNS TB  Hubert Madden, I., Shekhar, S., Yadav, T. et al. The many faces of intracranial tuberculosis: atypical presentations on MRI--a descriptive observational cohort study. [place] J Radiol Nucl Med [phone] (2023). - Quantiferon Gold and PPD have been ordered - I have called the lab and they no longer have the CSF specimen from March, therefore unable to run a  CSF TB-NAA test. Unable to perform LP due to risk for herniation as well as current anticoagulation.  - ID consult is recommended. ID is available by telephone only from 8 AM - 5 PM on the weekends. Please call Dr. Cephas Collier of ID after 8 AM at [phone] - If TB testing is negative, or if the above autoimmune tests come back positive, would consider an unusual presentation of vasculitis and consider empiric pulsed dose steroids.  - Would repeat CT head in 24 hours to assess for stability of the diffuse cerebral edema.  - Will start hypertonic saline at 50 cc/hr  ______________________________________________________________________    Hope Ly, Rosalyn Archambault, MD [facility]

## 2024-04-11 NOTE — Consult Note (Signed)
 [facility]    Date of Admission:  04/10/2024     Reason for Consult: ams    Referring Provider: Zaida Hertz     Lines:  Peripheral iv's  Abx: 6/1-c vanc 6/1-c acyclovir  6/1-c ceftriaxone   6/1 ampicillin        Assessment: 33 y.o. male [name] but childhood acquired deafness (some ASL), recent cerebral vein thrombosis, discharged to cir, back for ?waxing waning mentation, HFrEF (3/18 echo), and ID consulted at request of neurology for evaluation of possible tb meningitis  From my discussion with his family member (brother speaks english) and review of chart including all vitals and previous labs, the sentinel event was clinical meningitis with an RVP pcr of coronavirus and mri leptomeningeal enhancement. There was likely concomitant extensive dural sinus thrombosis at that time or it quickly developed.    There has never been any fever otherwise since 01/2024 or bacteremia, including this admission 4/28 bcx corynebacterium species was a contaminant  He doensn't really have an tb risk factors  The overall salient features of his course since dx of the viral meningitis was cerebral cytotoxic edema and waxing/waning mentation with the edema. The thrombotic burden at some point has gotten worse as of 03/08/2024 but since stable. Edema remains significant  I suspect that current presentation is due to the edema   To be complete I will repeat serology for RMSF/ehrlichiosis and add brucella serology. His initial rmsf/ehrlichiosis was negative could be due to early exposure; he did have lft elevation and exposure hx.   Epidemic typhus, while can be associated with squirrel, is rather rare in the [place]  and not known to be in [place] . Transmitted the same way is bartonella quintana but this is also very rare and he hasn't had relapsing fever/transaminitis syndrome for me to really work this up. His echo 01/2024 also showed no sign of endocarditis as the  quintana can do it  Trichinellosis can also infect boar eating people, but he never has syndrome of such or eosinophilia   Reviewed neurology note and ddx include autoimmune --> never workedup for thrombophilia previously and I will send  Plan: I have stopped empiric abx Ehrlichiosis/rmsf serology, brucella serology -- ordered for tomorrow Continue cerebral edema management per primary team/neurology While an infection can trigger thrombosis, I will also send thrombophilliac and lupus workup to be complete. Afterall his current presentation if vasculitis is hiding underneath all this could be releated to a connective tissue disease or lupus Maintain standard isolation precaution Discussed with neurology and dr Zaida Hertz of pulm ccm      ------------------------------------------------ Principal Problem:   Encephalopathy acute Active Problems:   Cerebral edema (HCC)    HPI: Samuel Warren is a 33 y.o. male [name] but childhood acquired deafness (some ASL), recent cerebral vein thrombosis, discharged to cir, back for ?waxing waning mentation, HFrEF (3/18 echo), and ID consulted at request of neurology for evaluation of possible tb meningitis  I spoke with the brother who lives with patient and was hunting with patient: Per the brother when the patient went home he was fine, then within 24 hours had some nausea when he tried to take one of the pills. Still eating. But continues to have nausea with the taking pill. On the day of admission 5/31 has decreased mentation  I reviewed the meds --> midodrine , coumadin , atorvastatin , and several other psychotropic meds including amitryptiline, tizanidine , methocarbamol , and a depakote    I reviewed chart  and discussed with admitting team -01/25/24 and 02/01/24 admissions from home for headache, photophobia, neck pain after a hunting excursion (for wild boar) in [place]. Initial head mri leptomeningitis; mra no abnormality. Transaminitis present; LP 22  wbc mixed pleocytosis; csf pcr negative; RVP pcr coronavirus (noncovid); spotted fever group serology negative; csf vdrl negative; rpr nonreactive; ehrlichia serology negative. 4 days bsAbx and 7 days doxycycline . 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 3/24 ir thrombectomy. Anticoagulation eliquis  --> coumadin  -Has had issues with waxing/waning mentation and right sided weakness, in setting of imaging confirmed ongoing cerebral edema so 4/22 then 4/28 admissions again. Ultimately discharged to CIR 03/18/24. 4/28 ct venogram progression of dural sinus thrombosis. Underwent repeat IR thrombectomy 03/09/24 unsuccessful -5/8-5/28 discharged to CIR. 5/9 medrol  dose pack for intractable headache. Increasing right arm weakness essentially stable 5/13 head cta and 5/14 brain mri  6/01 admitted from home? Vs rehab for a few days of being less responsive.  CT venogram and brain mri this admission essentially unchanged thrombosis, cerebral edema  Cxr is clear  Afebrile this admission Afebrile at home Normal platelet/hemoglobin Sodium 138 Alt mildly up at 46   Neurology consulted, working up for autoimmune/paraneoplastic. Query TB meningitis  Soc hx: Married; 2 yo son No ill contact [name]; immig[place] to [place]  2012 Hearing loss during childhood due to some kind of illness; some ASL Prior tb exposure screen at immigration negative; patient went back to [place] to marry his wife who also has a negative tb screening No other tb risk factors  With regard to other exposure --> they hunt squirrel and wild boar. Cooked and ate them. Bbq/fry.   He never had eosinophilia  He never had fever even when dx'ed with corona virus during 3/16 admision. I query if he has had a cold prior that is unrelated to the venous sinus thrombosis. His lp has slightly elevated protein, 22  wbc but also significant 1300 rbc. However, the mri did show leptomeningeal enhancement   No family history on file.  Social History   Tobacco Use   Smoking status: Never   Smokeless tobacco: Never  Substance Use Topics   Alcohol use: Not Currently   Drug use: Not Currently    No Known Allergies  Review of Systems: ROS All Other ROS was negative, except mentioned above   Past Medical History:  Diagnosis Date   Deaf        Scheduled Meds:  amitriptyline   50 mg Oral QHS   atorvastatin   80 mg Oral Daily   Chlorhexidine  Gluconate Cloth  6 each Topical Q0600   divalproex   250 mg Oral Q12H   multivitamin with minerals  1 tablet Oral Daily   pantoprazole   40 mg Oral Daily   tuberculin  5 Units Intradermal Once   Continuous Infusions:  sodium chloride  10 mL/hr at 04/11/24 1000   acyclovir  Stopped (04/11/24 0721)   cefTRIAXone  (ROCEPHIN )  IV Stopped (04/11/24 0849)   lactated ringers      sodium chloride  (hypertonic) 50 mL/hr at 04/11/24 1005   vancomycin  Stopped (04/11/24 0906)   PRN Meds:.acetaminophen  **OR** acetaminophen , albuterol , methocarbamol , mouth rinse, polyethylene glycol   OBJECTIVE: Blood pressure (!) 146/99, pulse 76, temperature 98.8 F (37.1 C), temperature source Axillary, resp. rate 13, SpO2 96%.  Physical Exam  General/constitutional: no distress, tracking; deaf HEENT: Normocephalic, PER, Conj Clear, EOMI, Oropharynx clear Neck supple CV:  rrr no mrg Lungs: clear to auscultation, normal respiratory effort Abd: Soft, Nontender Ext: no edema Skin: No Rash Neuro: seems to be following and cooperative to simple command squeezing hands; nonverbal; generalized weakness but no assymetry MSK: no peripheral joint swelling/tenderness/warmth; back spines nontender    Lab Results Lab Results  Component Value Date   WBC 15.0 (H) 04/10/2024   HGB 15.0 04/10/2024   HCT 44.0 04/10/2024   MCV 83.6 04/10/2024   PLT 275 04/10/2024    Lab Results   Component Value Date   CREATININE 0.80 04/10/2024   BUN 10 04/10/2024   NA 141 04/11/2024   K 3.7 04/10/2024   CL 104 04/10/2024   CO2 23 04/10/2024    Lab Results  Component Value Date   ALT 46 (H) 04/10/2024   AST 19 04/10/2024   ALKPHOS 57 04/10/2024   BILITOT 0.4 04/10/2024      Microbiology: Recent Results (from the past 240 hours)  Blood Culture (routine x 2)     Status: None (Preliminary result)   Collection Time: 04/10/24  7:02 PM   Specimen: BLOOD  Result Value Ref Range Status   Specimen Description BLOOD SITE NOT SPECIFIED  Final   Special Requests   Final    BOTTLES DRAWN AEROBIC AND ANAEROBIC Blood Culture adequate volume   Culture   Final    NO GROWTH < 12 HOURS Performed at [facility], [address], [place], [place] [address]    Report Status PENDING  Incomplete  Blood Culture (routine x 2)     Status: None (Preliminary result)   Collection Time: 04/10/24  9:29 PM   Specimen: BLOOD LEFT HAND  Result Value Ref Range Status   Specimen Description BLOOD LEFT HAND  Final   Special Requests   Final    BOTTLES DRAWN AEROBIC ONLY Blood Culture results may not be optimal due to an inadequate volume of blood received in culture bottles   Culture   Final    NO GROWTH < 12 HOURS Performed at [facility], [address] [place], [place] [address]    Report Status PENDING  Incomplete     Serology:    Imaging: If present, new imagings (plain films, ct scans, and mri) have been personally visualized and interpreted; radiology reports have been reviewed. Decision making incorporated into the Impression / Recommendations.  6/1 brain mri wwo contrast FINDINGS: Brain: Known areas of subacute infarct were better characterized on prior MRI. No evidence of new/interval acute large vascular territory infarct or acute hemorrhage. No evidence of hydrocephalus or mass lesion. Similar low lying tonsils. Partially empty sella.   Vascular:  Redemonstrated areas of hyperdensity within the dural venous sinuses, compatible with known dural venous sinus thrombosis.   Skull: No acute fracture.   Sinuses/Orbits: Clear sinuses.  No acute orbital findings.   IMPRESSION: 1. No significant change in comparison to May 5 CT head. Known areas of subacute infarct were better characterized on MRI. No evidence of new/interval acute large vascular territory infarct or acute hemorrhage. 2. Redemonstrated areas of hyperdensity within the dural venous sinuses, compatible with known dural venous sinus thrombosis that was better characterized on prior CTV and MRI. 3. Similar low lying cerebellar tonsils.   5/31 cxr FINDINGS: The heart size and mediastinal contours are within normal limits. Both lungs are clear. The visualized skeletal structures are unremarkable. There is gaseous distention of the stomach.   IMPRESSION: No active disease.   5/31 ct  venogram head 1. Persistent dural venous sinus thrombosis involving the posterior aspect of the superior sagittal sinus, with involvement of the transverse and sigmoid sinuses bilaterally. Overall, appearance is similar as compared to previous exams. 2. Diffuse loss of cortical sulcation throughout the brain, with effacement of the basilar cisterns and low lying cerebellar tonsils, similar to previous exams. 3. Hypodensity involving the subcortical parafalcine left frontal lobe, consistent with sequelae of prior venous ischemic changes, similar to prior. No visible new ischemic changes identified. No intracranial hemorrhage.  ------ Previous admissions imaging 03/24/24 brain mri wo contrast 1. No acute infarct. 2. Abnormal flow voids of the bilateral sigmoid and transverse sinuses and the superior sagittal sinus, consistent with known dural venous sinus thrombosis. 3. Unchanged region of gliosis within the superior paramedian left frontal lobe. 4. Cerebellar tonsils extend 7 mm  below the foramen magnum, consistent with Chiari I malformation.  5/13 cta head/neck 1. No emergent large vessel occlusion or high-grade stenosis of the intracranial arteries. 2. Redemonstration of extensive dural venous sinus thrombosis with limited flow in the transverse and sigmoid sinuses. Approximately 50% of the length of the superior sagittal sinus is occluded. 3. Persistent cavernous sinus thrombosis.  5/13 ct head 1. No acute intracranial hemorrhage. 2. ASPECTS is 10. 3. Hyperdensity within the superior sagittal sinus is consistent with known areas of dural venous sinus thrombosis. 4. Low lying cerebellar tonsils, unchanged.  5/10 ct head 1. No significant change in comparison to May 5 CT head. Known areas of subacute infarct were better characterized on MRI. No evidence of new/interval acute large vascular territory infarct or acute hemorrhage. 2. Redemonstrated areas of hyperdensity within the dural venous sinuses, compatible with known dural venous sinus thrombosis that was better characterized on prior CTV and MRI. 3. Similar low lying cerebellar tonsils.  4/28 ct venogram head 1. Mild progression of extensive thrombus in the superior sagittal sinus posteriorly. 2. Mild improvement of subocclusive thrombus in the transverse and sigmoid sinuses bilaterally. 3. Persistent bilateral cavernous sinus thrombosis.    Jamesetta Mcbride, MD [facility] [phone] pager    04/11/2024, 10:53 AM

## 2024-04-11 NOTE — Progress Notes (Addendum)
 Attempted to use [place] interpreter by phone for more in-depth neuro assessment. Only patient's mother at the bedside. Patient is deaf. Per interpreter, mother cannot communicate with son by gestures. RN unable to communicate with patient other than pantomiming. RN monitoring pupils, alertness, tracking eye movements, motor response.   Spoke with patient's brother who is to come to bedside 6/2 around 0900 to assist with communication.   Wife (speaks vietnamese) at the bedside at 1700, used interpreter to communicate with wife. Per wife, she only uses gestures to communicate with patient. She believes patient is at his same level of mentation as before coming to hospital. Patient's wife and mother updated at the bedside.

## 2024-04-12 ENCOUNTER — Inpatient Hospital Stay (HOSPITAL_COMMUNITY)

## 2024-04-12 DIAGNOSIS — G936 Cerebral edema: Secondary | ICD-10-CM | POA: Diagnosis not present

## 2024-04-12 DIAGNOSIS — I69391 Dysphagia following cerebral infarction: Secondary | ICD-10-CM

## 2024-04-12 DIAGNOSIS — I69898 Other sequelae of other cerebrovascular disease: Secondary | ICD-10-CM

## 2024-04-12 DIAGNOSIS — I10 Essential (primary) hypertension: Secondary | ICD-10-CM

## 2024-04-12 DIAGNOSIS — G08 Intracranial and intraspinal phlebitis and thrombophlebitis: Secondary | ICD-10-CM | POA: Diagnosis not present

## 2024-04-12 DIAGNOSIS — E785 Hyperlipidemia, unspecified: Secondary | ICD-10-CM

## 2024-04-12 LAB — CBC
HCT: 38.9 % — ABNORMAL LOW (ref 39.0–52.0)
Hemoglobin: 12.2 g/dL — ABNORMAL LOW (ref 13.0–17.0)
MCH: 26.6 pg (ref 26.0–34.0)
MCHC: 31.4 g/dL (ref 30.0–36.0)
MCV: 84.9 fL (ref 80.0–100.0)
Platelets: 219 10*3/uL (ref 150–400)
RBC: 4.58 MIL/uL (ref 4.22–5.81)
RDW: 13.2 % (ref 11.5–15.5)
WBC: 10.5 10*3/uL (ref 4.0–10.5)
nRBC: 0 % (ref 0.0–0.2)

## 2024-04-12 LAB — BASIC METABOLIC PANEL WITH GFR
Anion gap: 12 (ref 5–15)
BUN: 8 mg/dL (ref 6–20)
CO2: 18 mmol/L — ABNORMAL LOW (ref 22–32)
Calcium: 8.8 mg/dL — ABNORMAL LOW (ref 8.9–10.3)
Chloride: 115 mmol/L — ABNORMAL HIGH (ref 98–111)
Creatinine, Ser: 0.64 mg/dL (ref 0.61–1.24)
GFR, Estimated: >60 mL/min (ref >60)
Glucose, Bld: 107 mg/dL — ABNORMAL HIGH (ref 70–99)
Potassium: 3.7 mmol/L (ref 3.5–5.1)
Sodium: 145 mmol/L (ref 135–145)

## 2024-04-12 LAB — ANTITHROMBIN III: AntiThromb III Func: 120 % (ref 75–120)

## 2024-04-12 LAB — EXTRACTABLE NUCLEAR ANTIGEN ANTIBODY
ENA SM Ab Ser-aCnc: <0.2 AI (ref 0.0–0.9)
Ribonucleic Protein: <0.2 AI (ref 0.0–0.9)
SSA (Ro) (ENA) Antibody, IgG: <0.2 AI (ref 0.0–0.9)
SSB (La) (ENA) Antibody, IgG: <0.2 AI (ref 0.0–0.9)
Scleroderma (Scl-70) (ENA) Antibody, IgG: <0.2 AI (ref 0.0–0.9)
ds DNA Ab: <1 [IU]/mL (ref 0–9)

## 2024-04-12 LAB — PROTIME-INR
INR: 7.2 (ref 0.8–1.2)
Prothrombin Time: 61.7 s — ABNORMAL HIGH (ref 11.4–15.2)

## 2024-04-12 LAB — SODIUM: Sodium: 145 mmol/L (ref 135–145)

## 2024-04-12 LAB — PHOSPHORUS: Phosphorus: 2.7 mg/dL (ref 2.5–4.6)

## 2024-04-12 LAB — MAGNESIUM: Magnesium: 2 mg/dL (ref 1.7–2.4)

## 2024-04-12 MED ORDER — HYDROMORPHONE HCL 1 MG/ML IJ SOLN
0.5000 mg | Freq: Four times a day (QID) | INTRAMUSCULAR | Status: DC | PRN
  Administered 2024-04-12: 0.5 mg via INTRAVENOUS
  Filled 2024-04-12: qty 1

## 2024-04-12 MED ORDER — DIVALPROEX SODIUM 250 MG PO DR TAB
250.0000 mg | DELAYED_RELEASE_TABLET | Freq: Two times a day (BID) | ORAL | Status: DC
  Administered 2024-04-12: 250 mg via ORAL
  Filled 2024-04-12 (×2): qty 1

## 2024-04-12 MED ORDER — ENSURE PLUS HIGH PROTEIN PO LIQD
237.0000 mL | Freq: Two times a day (BID) | ORAL | Status: DC
  Administered 2024-04-14: 65 mL via ORAL
  Administered 2024-04-14: 50 mL via ORAL
  Administered 2024-04-15 – 2024-04-16 (×2): 237 mL via ORAL

## 2024-04-12 MED ORDER — DEXTROSE-SODIUM CHLORIDE 5-0.9 % IV SOLN: INTRAVENOUS | Status: AC

## 2024-04-12 MED ORDER — HYDROMORPHONE HCL 1 MG/ML IJ SOLN: 0.5000 mg | Freq: Four times a day (QID) | INTRAMUSCULAR | Status: DC | PRN

## 2024-04-12 NOTE — Progress Notes (Addendum)
 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  Patient was initially discharged with Eliquis  but due to failed treatment, it was switched to Coumadin , patient was discharged to rehab Per patient's family since he got discharged from the hospital he has been having increasing generalized weakness, lethargy and headache with poor p.o. intake, over the last few days he has becoming less and less responsive, so he was brought to the emergency department.  MRI brain showing diffuse cerebral edema with early central herniation syndrome, neurology was consulted, patient was started on antibiotics for possible bacterial meningitis, LP was opted out due to high INR and early herniation syndrome on MRI.  PCCM was consulted for aspiration and admission to ICU  Pertinent Medical History:  Dural venous sinus thrombosis Congenital deafness  Significant Hospital Events: Including procedures, antibiotic start and stop dates in addition to other pertinent events     Interim History / Subjective:  No significant events overnight Mental status improved from prior Passed swallow for DYS 1 diet Intermittently c/o neck/throat pain Family at bedside Off of hypertonic saline Stable for transfer out of ICU  Objective:   Blood pressure 110/84, pulse 84, temperature 97.9 F (36.6 C), temperature source Axillary, resp. rate 13, weight 60 kg, SpO2 97%.        Intake/Output Summary (Last 24 hours) at 04/12/2024 0814 Last data filed at 04/12/2024 0700 Gross per 24 hour  Intake  1836.41 ml  Output 2000 ml  Net -163.59 ml   Filed Weights   04/12/24 0438  Weight: 60 kg   Physical Examination: General: Acutely ill-appearing man in NAD. +Congenital deafness. HEENT: /AT, anicteric sclera, PERRL, moist mucous membranes. Neuro: Awake, unable to assess orientation. Tracks examiner and motions appropriately for what he needs. Responds to tactile stimuli. Following commands intermittently/pantomiming. Moves BLE spontaneously. Moves LUE. RUE plegic. +Cough and +Gag  CV: RRR, no m/g/r. PULM: Breathing even and unlabored on RA. Lung fields diminished at bilateral bases GI: Soft, nontender, nondistended. Normoactive bowel sounds. Extremities: No significant LE edema noted. Skin: Warm/dry, no rashes.  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  Best Practice (right click and "Reselect all SmartList Selections" daily)   Diet/type: dysphagia diet (see orders) DVT prophylaxis: SCDs, contraindicated in the setting of elevated INR to 7 Pressure ulcer(s): Please see nursing notes GI prophylaxis: N/A Lines: N/A Foley:  N/A Code Status:  full code Last date of  multidisciplinary goals of care discussion [pending]  Signature:   Genoveva Kidney [facility] 04/12/24 8:14 AM  Please see [email or web address] for pager details.  From 7A-7P if no response, please call [phone] After hours, please call ELink [phone]

## 2024-04-12 NOTE — Plan of Care (Signed)
 Communication with patient continues to be an area of difficulty. Primary communication is via pantomiming. Patient grimacing in pain when RN repositions, especially when it involves moving the neck. RN offered both PO and rectal pain medication and patient refused both options. Lunch and dinner options (Dysphagia 1/Nectar thick liquids) refused as well. Will continue oral nutrition and hydration.  Care team notified of refusal of nutrition/hydration and pain medications. New orders for D5NS and IV pain medication received. Will initiate as ordered.   Problem: Education: Goal: Knowledge of General Education information will improve Description: Including pain rating scale, medication(s)/side effects and non-pharmacologic comfort measures Outcome: Not Progressing   Problem: Activity: Goal: Risk for activity intolerance will decrease Outcome: Not Progressing   Problem: Nutrition: Goal: Adequate nutrition will be maintained Outcome: Not Progressing  04/12/2024 Alva Jewels, RN, BSN 5:28 PM

## 2024-04-12 NOTE — Progress Notes (Signed)
 Date and time results received: 04/12/24 10:51  Test: INR Critical Value: 7.2  Name of Provider Notified: Dr. Janett Medin and Shelli Dexter, PA  Hold Coumadin  per Stroke MD D/C order for cortrak per Alayne Hubert, [place] with CCM  04/12/2024 Alva Jewels, RN, BSN 11:02 AM

## 2024-04-12 NOTE — Progress Notes (Addendum)
 STROKE TEAM PROGRESS NOTE    INTERIM HISTORY/SUBJECTIVE Patient is well-known to the stroke team from multiple previous consultations for extensive cerebral venous sinus thrombosis following episode of viral meningitis.  He was transferred to rehab and recently discharged home but got readmitted on 04/10/1934 with a 3-day history of lethargy, not eating well with vomiting and weakness.  MRI scan of the brain shows some lesions now of developing encephalomalacia and subcortical white matter gliosis.  There is there is some increase in interestingly WI hyperintensity in bilateral centrum semiovale.  There are persistent intermittent filling defects in cerebral venous sinuses not particularly change compared to the previous MRI. He has been hemodynamically stable with Tmax of 99.9.  Hypertonic saline has been stopped, will focus on hydration.  Today's INR was 7.2, and Coumadin  was held.  OBJECTIVE  CBC    Component Value Date/Time   WBC 10.5 04/12/2024 0625   RBC 4.58 04/12/2024 0625   HGB 12.2 (L) 04/12/2024 0625   HCT 38.9 (L) 04/12/2024 0625   PLT 219 04/12/2024 0625   MCV 84.9 04/12/2024 0625   MCH 26.6 04/12/2024 0625   MCHC 31.4 04/12/2024 0625   RDW 13.2 04/12/2024 0625   LYMPHSABS 0.8 04/10/2024 1905   MONOABS 0.7 04/10/2024 1905   EOSABS 0.0 04/10/2024 1905   BASOSABS 0.0 04/10/2024 1905    BMET    Component Value Date/Time   NA 145 04/12/2024 0842   K 3.7 04/12/2024 0842   CL 115 (H) 04/12/2024 0842   CO2 18 (L) 04/12/2024 0842   GLUCOSE 107 (H) 04/12/2024 0842   BUN 8 04/12/2024 0842   CREATININE 0.64 04/12/2024 0842   CALCIUM  8.8 (L) 04/12/2024 0842   GFRNONAA >60 04/12/2024 0842    IMAGING past 24 hours CT HEAD WO CONTRAST ( ) Result Date: 04/12/2024 CLINICAL DATA:  33 year old male with complicated dural venous sinus thrombosis. Abnormal brain MRI yesterday suggesting cerebral edema. EXAM: CT HEAD WITHOUT CONTRAST TECHNIQUE: Contiguous axial images were obtained  from the base of the skull through the vertex without intravenous contrast. RADIATION DOSE REDUCTION: This exam was performed according to the departmental dose-optimization program which includes automated exposure control, adjustment of the mA and/or kV according to patient size and/or use of iterative reconstruction technique. COMPARISON:  Brain MRI yesterday.  Head CT 03/23/2024 and earlier. FINDINGS: Brain: Compared to earliest noncontrast head CT on 01/25/2024 there remains loss of some basilar cisterns. But the brainstem and cerebellar configuration appears improved from the MRI yesterday. The pontine shape appears more normal. Stable diminutive, probably compressed appearance of the ventricular system. No midline shift. No acute intracranial hemorrhage identified. There is maintained gray-white differentiation, with focal hypodensity in the left superior perirolandic subcortical white matter. No new cortically based infarct is identified. Vascular: Abnormal brain vasculature as described on the MRI yesterday. Mixed density in the posterior segment of the superior sagittal sinus again noted. Skull: Intact.  No acute osseous abnormality identified. Sinuses/Orbits: Stable left mastoid opacification and sclerosis over this series of exams. Paranasal Visualized paranasal sinuses and mastoids are stable and well aerated. Other: Visualized orbits and scalp soft tissues are within normal limits. IMPRESSION: 1. Loss of some basilar cisterns when compared to the earliest March CTs, but the brainstem configuration appears improved from the MRI yesterday. 2. Maintained gray-white differentiation, aside from the known left superior subcortical infarct. No intracranial hemorrhage, midline shift, or ventriculomegaly. Electronically Signed   By: Marlise Simpers M.D.   On: 04/12/2024 04:38    Vitals:  04/12/24 0900 04/12/24 1000 04/12/24 1100 04/12/24 1200  BP: 115/85 (!) 120/92 (!) 124/93   Pulse: 75 82 81   Resp: 11 14 16     Temp:    98.9 F (37.2 C)  TempSrc:    Axillary  SpO2: 97% 97% 97%   Weight:         PHYSICAL EXAM General:  Alert, well-nourished, well-developed patient in no acute distress Psych:  Mood and affect appropriate for situation CV: Regular rate and rhythm on monitor Respiratory:  Regular, unlabored respirations on room air   NEURO:  Mental Status: Patient is nonverbal, unable to follow commands but will mimic Speech/Language: Patient is nonverbal  Cranial Nerves:  II: Left pupil 4 mm, right pupil 3, both briskly reactive to light III, IV, VI: EOMI. Eyelids elevate symmetrically.  VII: Face is symmetrical resting and smiling VIII: hearing intact to voice. IX, X: Patient is nonverbal XII: tongue is midline without fasciculations. Motor: 3 out of 5 strength to right upper extremity, 4 out of 5 strength to left upper extremity, 1 out of 5 strength to right lower extremity, 2 out of 5 strength to left lower extremity Tone: is normal and bulk is normal Sensation- Intact to light touch bilaterally.  Coordination: Unable to perform Gait- deferred  ASSESSMENT/PLAN  [name] is a 33 y.o. 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 intake and headache.  MRI brain demonstrates sequelae of dural venous sinus thrombosis with cerebral edema with increasing intracranial mass effect and downward central herniation of the brain.,  Venous sinus thrombosis and dorsal superior sagittal sinus, medial transverse sinuses continues to be present.  He was initially started on hypertonic saline, but this has been discontinued due to patient appearing close to baseline on clinical exam and improvement seen on CT 6/2.  There was concern for recurrent meningitis, but lumbar puncture could not be  performed due to elevated INR.    Persistent CVST MRI severe sequelae of dural venous sinus thrombosis, cerebral edema and increasing mass effect with downward central herniation of the brain, early chronic asymmetric ischemia in left superior perirolandic area, venous sinus thrombosis and dorsal superior sagittal sinus and medial transverse sinuses Follow-up CT head 6/2 loss of basilar cisterns compared to earliest March CTs, but brainstem configuration improved from MRI yesterday, maintained gray-white differentiation LDL 58 HgbA1c 5.7 VTE prophylaxis -fully anticoagulated with Coumadin  warfarin daily prior to admission, now on hold due to elevated INR Therapy recommendations:  Pending Disposition: Pending  Hx of Stroke/TIA Patient has 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 No; Fluid consistency: Nectar Thick   Advance diet as tolerated  Other Stroke Risk Factors None   Other Active Problems Likely dehydration related to decreased p.o. intake-IV fluids per primary team  Hospital day # 2  Patient seen by NP with MD, MD to edit note as needed. Cortney E Bucky Cardinal , MSN, AGACNP-BC [facility] See Amion for schedule and pager information 04/12/2024 2:23 PM   I have personally obtained history,examined this patient,  reviewed notes, independently viewed imaging studies, participated in medical decision making and plan of care.ROS completed by me personally and pertinent positives fully documented  I have made any additions or clarifications directly to the above note. Agree with note above.  Patient with extensive cerebral venous sinus thrombosis in  March 2025 following viral meningitis presents with 3-day history of decreased intake and generalized weakness.  MRI shows some ill-defined diffusion hyperintensity with cerebral edema and slight increased intracranial mass effect but clinical exam more or less unchanged.  Will hold warfarin due to supratherapeutic INR today.  Recommend aggressive hydration.  Continue ongoing therapy.  Long discussion with the patient and mother at the bedside using [place] language interpreter.  No sign language interpreter was present at the bedside today. Discussed with Dr. Diania Fortes care medicine This patient is critically ill and at significant risk of neurological worsening, death and care requires constant monitoring of vital signs, hemodynamics,respiratory and cardiac monitoring, extensive review of multiple databases, frequent neurological assessment, discussion with family, other specialists and medical decision making of high complexity.I have made any additions or clarifications directly to the above note.This critical care time does not reflect procedure time, or teaching time or supervisory time of PA/NP/Med Resident etc but could involve care discussion time.  I spent 30 minutes of neurocritical care time  in the care of  this patient.     Ardella Beaver, MD Medical Director [facility] Pager: [phone] 04/12/2024 3:21 PM  To contact Stroke Continuity provider, please refer to [email or web address]. After hours, contact General Neurology

## 2024-04-12 NOTE — TOC CAGE-AID Note (Signed)
 Transition of Care [facility]) - CAGE-AID Screening   Patient Details  Name: Samuel Warren MRN: [id number] Date of Birth: 1991/10/21  Transition of Care [facility]) CM/SW Contact:    Tate Jerkins E Gaspard Isbell, LCSW Phone Number: 04/12/2024, 8:57 AM   Clinical Narrative:    CAGE-AID Screening:    Have You Ever Felt You Ought to Cut Down on Your Drinking or Drug Use?: No Have People Annoyed You By [employer] Your Drinking Or Drug Use?: No Have You Felt Bad Or Guilty About Your Drinking Or Drug Use?: No Have You Ever Had a Drink or Used Drugs First Thing In The Morning to Steady Your Nerves or to Get Rid of a Hangover?: No CAGE-AID Score: 0  Substance Abuse Education Offered: No

## 2024-04-12 NOTE — Progress Notes (Addendum)
 Speech Language Pathology Treatment: Dysphagia  Patient Details Name: Samuel Warren MRN: [id number] DOB: December 09, 1990 Today's Date: 04/12/2024 Time: 1027-2536 SLP Time Calculation (min) (ACUTE ONLY): 22 min  Assessment / Plan / Recommendation Clinical Impression  Pt with improved alertness and participation today. His mom and interpretor were at the bedside.  Offering visual modeling and basic gestures, pt encouraged to drink thin, nectar, honey thick liquids and bites of puree.  He demonstrated improved oral manipulation, palpable swallow and no s/s of aspiration with nectars, honey thick or pureed POs. Thin liquids elicited intermittent coughing (coughed with water this am per RN). Not yet back to baseline with regard to swallow function, but he is improving. Recommend starting dysphagia 1, nectar thick liquids. Give meds whole in puree. He will need assistance with feeding due to BUE weakness. Will benefit from OT, PT evals.  D/W pt's mom via interpretor, RN,and Dr. Janett Medin - SLP will follow.   HPI HPI: Patient is a 33 y.o. male with PMH: childhood acquired deafness (knows limited ASL), recent cerebral vein thrombosis. 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 He was discharged to CIR. Since discharging home from CIR, patient has reportedly been having increasing generalized weakness, lethargy and headache with poor PO intake. He became less and less responsive and he was brought to ED on 04/10/24. 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 He has been NPO awaiting SLP swallow evaluation.      SLP Plan  Continue with current plan of care      Recommendations for follow up therapy are one component of a multi-disciplinary discharge planning process, led by the  attending physician.  Recommendations may be updated based on patient status, additional functional criteria and insurance authorization.    Recommendations  Diet recommendations: Dysphagia 1 (puree);Nectar-thick liquid Liquids provided via: Cup;Straw Medication Administration: Whole meds with puree Supervision: Trained caregiver to feed patient Compensations: Small sips/bites Postural Changes and/or Swallow Maneuvers: Seated upright 90 degrees                  Oral care BID   Frequent or constant Supervision/Assistance Dysphagia, unspecified (R13.10)     Continue with current plan of care    Raeford Brandenburg L. Beatris Lincoln, MA CCC/SLP Clinical Specialist - Acute Care SLP Acute Rehabilitation Services Office number [phone]  Myna Asal Laurice  04/12/2024, 10:35 AM

## 2024-04-13 DIAGNOSIS — R4182 Altered mental status, unspecified: Secondary | ICD-10-CM | POA: Diagnosis not present

## 2024-04-13 DIAGNOSIS — G936 Cerebral edema: Secondary | ICD-10-CM | POA: Diagnosis not present

## 2024-04-13 DIAGNOSIS — G9340 Encephalopathy, unspecified: Secondary | ICD-10-CM | POA: Diagnosis not present

## 2024-04-13 DIAGNOSIS — I69898 Other sequelae of other cerebrovascular disease: Secondary | ICD-10-CM | POA: Diagnosis not present

## 2024-04-13 DIAGNOSIS — I69391 Dysphagia following cerebral infarction: Secondary | ICD-10-CM | POA: Diagnosis not present

## 2024-04-13 DIAGNOSIS — G08 Intracranial and intraspinal phlebitis and thrombophlebitis: Secondary | ICD-10-CM | POA: Diagnosis not present

## 2024-04-13 LAB — ANTIEXTRACTABLE NUCLEAR AG
ENA SM Ab Ser-aCnc: <0.2 AI (ref 0.0–0.9)
Ribonucleic Protein: <0.2 AI (ref 0.0–0.9)

## 2024-04-13 LAB — PROTEIN C ACTIVITY: Protein C Activity: 32 % — ABNORMAL LOW (ref 73–180)

## 2024-04-13 LAB — PROTEIN S ACTIVITY: Protein S Activity: 14 % — ABNORMAL LOW (ref 63–140)

## 2024-04-13 LAB — EHRLICHIA ANTIBODY PANEL
E chaffeensis (HGE) Ab, IgG: NEGATIVE
E chaffeensis (HGE) Ab, IgM: NEGATIVE
E. Chaffeensis (HME) IgM Titer: NEGATIVE
E.Chaffeensis (HME) IgG: NEGATIVE

## 2024-04-13 LAB — PROTIME-INR
INR: 5.8 (ref 0.8–1.2)
Prothrombin Time: 52 s — ABNORMAL HIGH (ref 11.4–15.2)

## 2024-04-13 MED ORDER — VALPROATE SODIUM 100 MG/ML IV SOLN
250.0000 mg | Freq: Two times a day (BID) | INTRAVENOUS | Status: DC
  Administered 2024-04-13 – 2024-04-16 (×7): 250 mg via INTRAVENOUS
  Filled 2024-04-13: qty 2.5
  Filled 2024-04-13: qty 250
  Filled 2024-04-13 (×4): qty 2.5
  Filled 2024-04-13: qty 250
  Filled 2024-04-13 (×2): qty 2.5

## 2024-04-13 MED ORDER — DEXTROSE-SODIUM CHLORIDE 5-0.9 % IV SOLN: INTRAVENOUS | Status: DC

## 2024-04-13 NOTE — TOC CM/SW Note (Signed)
 Transition of Care [facility]) - Inpatient Brief Assessment   Patient Details  Name: Samuel Warren MRN: [id number] Date of Birth: [birth year]  Transition of Care [facility]) CM/SW Contact:    Jonathan Neighbor, RN Phone Number: 04/13/2024, 1:35 PM   Clinical Narrative:  Pt was recently discharged from CIR to home and returned to hospital within 3 days.  Pt is deaf and doesn't use any formal sign language.  Therapies to eval.  Pt not alert enough per ST for PO intake. TOC following.   Transition of Care Asessment: Insurance and Status: Insurance coverage has been reviewed Patient has primary care physician: Yes Home environment has been reviewed: home with family     Social Drivers of Health Review: SDOH reviewed no interventions necessary Readmission risk has been reviewed: Yes Transition of care needs: transition of care needs identified, TOC will continue to follow

## 2024-04-13 NOTE — Progress Notes (Signed)
 SLP Cancellation Note  Patient Details Name: Samuel Warren MRN: [id number] DOB: 12/24/1990   Cancelled treatment:        SLP attempted to visit patient to target dysphagia goals, however patient extremely lethargic. SLP attempted to alert via tactile stimulation, however patient with brief eye opening then closing. Patient is not safe for PO intake at this time.    Jahnay Lantier M.A., CCC-SLP 04/13/2024, 1:09 PM

## 2024-04-13 NOTE — Progress Notes (Signed)
 STROKE TEAM PROGRESS NOTE    INTERIM HISTORY/SUBJECTIVE Patient is well-known to the stroke team from multiple previous consultations for extensive cerebral venous sinus thrombosis following episode of viral meningitis.  He was transferred to rehab and recently discharged home but got readmitted on 04/10/1934 with a 3-day history of lethargy, not eating well with vomiting and weakness.  MRI scan of the brain shows some lesions now of developing encephalomalacia and subcortical white matter gliosis.  There is there is some increase in interestingly WI hyperintensity in bilateral centrum semiovale.  There are persistent intermittent filling defects in cerebral venous sinuses not particularly change compared to the previous MRI. He has been hemodynamically stable with Tmax of 99.9.  Hypertonic saline has been stopped, will focus on hydration.  Today's INR was 7.2, and Coumadin  was held.  OBJECTIVE  CBC    Component Value Date/Time   WBC 10.5 04/12/2024 0625   RBC 4.58 04/12/2024 0625   HGB 12.2 (L) 04/12/2024 0625   HCT 38.9 (L) 04/12/2024 0625   PLT 219 04/12/2024 0625   MCV 84.9 04/12/2024 0625   MCH 26.6 04/12/2024 0625   MCHC 31.4 04/12/2024 0625   RDW 13.2 04/12/2024 0625   LYMPHSABS 0.8 04/10/2024 1905   MONOABS 0.7 04/10/2024 1905   EOSABS 0.0 04/10/2024 1905   BASOSABS 0.0 04/10/2024 1905    BMET    Component Value Date/Time   NA 145 04/12/2024 1401   K 3.7 04/12/2024 0842   CL 115 (H) 04/12/2024 0842   CO2 18 (L) 04/12/2024 0842   GLUCOSE 107 (H) 04/12/2024 0842   BUN 8 04/12/2024 0842   CREATININE 0.64 04/12/2024 0842   CALCIUM  8.8 (L) 04/12/2024 0842   GFRNONAA >60 04/12/2024 0842    IMAGING past 24 hours No results found.   Vitals:   04/13/24 0539 04/13/24 0731 04/13/24 1218 04/13/24 1510  BP: (!) 140/95 (!) 144/97 (!) 149/95 (!) 145/111  Pulse: 60 67 77 89  Resp:  14 20 18   Temp:  99 F (37.2 C) 99 F (37.2 C) 98.6 F (37 C)  TempSrc:  Oral Oral Oral   SpO2:  99% 99% 97%  Weight:         PHYSICAL EXAM General:  Alert, well-nourished, well-developed patient in no acute distress Psych:  Mood and affect appropriate for situation CV: Regular rate and rhythm on monitor Respiratory:  Regular, unlabored respirations on room air   NEURO:  Mental Status: Patient is nonverbal, unable to follow commands but will mimic Speech/Language: Patient is nonverbal  Cranial Nerves:  II: Left pupil 4 mm, right pupil 3, both briskly reactive to light III, IV, VI: EOMI. Eyelids elevate symmetrically.  VII: Face is symmetrical resting and smiling VIII: hearing intact to voice. IX, X: Patient is nonverbal XII: tongue is midline without fasciculations. Motor: 3 out of 5 strength to right upper extremity, 4 out of 5 strength to left upper extremity, 1 out of 5 strength to right lower extremity, 2 out of 5 strength to left lower extremity Tone: is normal and bulk is normal Sensation- Intact to light touch bilaterally.  Coordination: Unable to perform Gait- deferred  ASSESSMENT/PLAN  [name] is a 33 y.o. 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 intake and headache.  MRI brain demonstrates sequelae of dural venous sinus thrombosis with cerebral edema  with increasing intracranial mass effect and downward central herniation of the brain.,  Venous sinus thrombosis and dorsal superior sagittal sinus, medial transverse sinuses continues to be present.  He was initially started on hypertonic saline, but this has been discontinued due to patient appearing close to baseline on clinical exam and improvement seen on CT 6/2.  There was concern for recurrent meningitis, but lumbar puncture could not be performed due to elevated INR.    Persistent  CVST MRI severe sequelae of dural venous sinus thrombosis, cerebral edema and increasing mass effect with downward central herniation of the brain, early chronic asymmetric ischemia in left superior perirolandic area, venous sinus thrombosis and dorsal superior sagittal sinus and medial transverse sinuses Follow-up CT head 6/2 loss of basilar cisterns compared to earliest March CTs, but brainstem configuration improved from MRI yesterday, maintained gray-white differentiation LDL 58 HgbA1c 5.7 VTE prophylaxis -fully anticoagulated with Coumadin  warfarin daily prior to admission, now on hold due to elevated INR Therapy recommendations:  Pending Disposition: Pending  Hx of Stroke/TIA Patient has 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 No; Fluid consistency: Nectar Thick   Advance diet as tolerated  Other Stroke Risk Factors None   Other Active Problems Likely dehydration related to decreased p.o. intake-IV fluids per primary team  Hospital day # 3   Patient with extensive cerebral venous sinus thrombosis in March 2025 following viral meningitis presents with 3-day history of decreased intake and generalized weakness.  MRI shows some ill-defined diffusion hyperintensity with cerebral edema and slight increased intracranial mass effect but clinical exam more or less unchanged.  Will hold warfarin due to supratherapeutic INR today.  Resume warfarin when INR is below 2 recommend aggressive hydration.  Continue aggressive hydration and ongoing therapy.  Long discussion with the patient and mother at the bedside  No sign language interpreter was present at the bedside  today. Discussed with Dr. Nichole Barker Greater than 50% time during this 30-minute visit was spent in counseling and coordination of care about his cerebral venous anastomosis and weakness discussion with patient and mother in care team and answering questions. Stroke team will sign off.  Can call for questions    Ardella Beaver, MD Medical Director [facility] Pager: [phone] 04/13/2024 3:18 PM  To contact Stroke Continuity provider, please refer to [email or web address]. After hours, contact General Neurology

## 2024-04-13 NOTE — Plan of Care (Signed)
  Problem: Clinical Measurements: Goal: Diagnostic test results will improve Outcome: Progressing Goal: Signs and symptoms of infection will decrease Outcome: Progressing   Problem: Fluid Volume: Goal: Hemodynamic stability will improve Outcome: Progressing   Problem: Clinical Measurements: Goal: Ability to maintain clinical measurements within normal limits will improve Outcome: Progressing Goal: Will remain free from infection Outcome: Progressing Goal: Diagnostic test results will improve Outcome: Progressing Goal: Respiratory complications will improve Outcome: Progressing Goal: Cardiovascular complication will be avoided Outcome: Progressing   Problem: Elimination: Goal: Will not experience complications related to bowel motility Outcome: Progressing Goal: Will not experience complications related to urinary retention Outcome: Progressing   Problem: Nutrition: Goal: Adequate nutrition will be maintained Outcome: Progressing   Problem: Pain Managment: Goal: General experience of comfort will improve and/or be controlled Outcome: Progressing   Problem: Safety: Goal: Ability to remain free from injury will improve Outcome: Progressing   Problem: Skin Integrity: Goal: Risk for impaired skin integrity will decrease Outcome: Progressing

## 2024-04-13 NOTE — Plan of Care (Signed)
  Problem: Respiratory: Goal: Ability to maintain adequate ventilation will improve Outcome: Progressing   Problem: Elimination: Goal: Will not experience complications related to urinary retention Outcome: Progressing   Problem: Clinical Measurements: Goal: Diagnostic test results will improve Outcome: Not Progressing Goal: Signs and symptoms of infection will decrease Outcome: Not Progressing   Problem: Education: Goal: Knowledge of General Education information will improve Description: Including pain rating scale, medication(s)/side effects and non-pharmacologic comfort measures Outcome: Not Progressing   Problem: Activity: Goal: Risk for activity intolerance will decrease Outcome: Not Progressing   Problem: Pain Managment: Goal: General experience of comfort will improve and/or be controlled Outcome: Not Progressing   Problem: Skin Integrity: Goal: Risk for impaired skin integrity will decrease Outcome: Not Progressing

## 2024-04-13 NOTE — Evaluation (Signed)
 Occupational Therapy Evaluation Patient Details Name: Samuel Warren MRN: [id number] DOB: [birth year] Today's Date: 04/13/2024   History of Present Illness   The pt is a 33 yo male presenting 5/31 from home with FTT. 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 MRI showed: early central herniation syndrome. 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     Clinical Impressions PT admitted with central herniation syndrome. Pt currently with functional limitiations due to the deficits listed below (see OT problem list). Pt with recent admission and d/c home with family (A). Pt at this time requires total care for all adls and not responding to commands.  Pt will benefit from skilled OT to increase their independence and safety with adls and balance to allow discharge Patient will benefit from intensive inpatient follow-up therapy, >3 hours/day.      If plan is discharge home, recommend the following:   Two people to help with walking and/or transfers;Two people to help with bathing/dressing/bathroom     Functional Status Assessment   Patient has had a recent decline in their functional status and demonstrates the ability to make significant improvements in function in a reasonable and predictable amount of time.     Equipment Recommendations   Wheelchair cushion (measurements OT);Wheelchair (measurements OT);Hospital bed;Hoyer lift     Recommendations for Other Services   Speech consult;Rehab consult     Precautions/Restrictions   Precautions Precautions: Fall;Other (comment) Recall of Precautions/Restrictions: Impaired Precaution/Restrictions Comments: R hemiparesis with inattention Restrictions Weight Bearing Restrictions Per Provider Order: No     Mobility Bed Mobility Overal bed mobility:  Needs Assistance             General bed mobility comments: dependent on support of trunk in bed, unable to move head or adjust position, L head position but could reach midline with assist. deferred OOB mobility due to lack of responsiveness    Transfers                          Balance                                           ADL either performed or assessed with clinical judgement   ADL Overall ADL's : Needs assistance/impaired                                       General ADL Comments: total (A) for all care. Ot smears tip of spoon with vanilla to give it flaver and placing on tip of tongue in an attempt to initate lip closure or licking. pt does not respond. pt stares blankly ahead without purpose.     Vision   Additional Comments: blinks to threat. pt tracks midline toward L but does not sustain. pt turning spontaneously to R x2 during session.     Perception Perception: Impaired Preception Impairment Details: Inattention/Neglect Perception-Other Comments: R inattention   Praxis Praxis: Impaired       Pertinent Vitals/Pain Pain Assessment Pain Assessment: Faces Pain Score: 0-No pain Faces Pain Scale: No hurt Pain Location: no reaction to noxious stimuli of hands, withdraws for LE Pain Intervention(s): Monitored during session  Extremity/Trunk Assessment Upper Extremity Assessment Upper Extremity Assessment: RUE deficits/detail;LUE deficits/detail RUE Deficits / Details: no activation no response to nail bed pressure. pt noted to have tricep tone with flexion toward face but not bicep RUE Coordination: decreased gross motor;decreased fine motor LUE Deficits / Details: no activation no response to nail bed pressure. pt noted to have tricep tone with flexion toward face but not bicep LUE Coordination: decreased fine motor;decreased gross motor   Lower Extremity Assessment Lower Extremity Assessment: Defer to PT  evaluation RLE Deficits / Details: no active movement to command, wiggled toes to noxious stimuli. 3-8 beats clonus at ankle LLE Deficits / Details: no active movement to command, wiggled toes to noxious stimuli. sustained beats clonus at ankle   Cervical / Trunk Assessment Cervical / Trunk Assessment: Other exceptions Cervical / Trunk Exceptions: head turned to L   Communication Communication Communication: Impaired Factors Affecting Communication: Hearing impaired;Other (comment) (no attempt to gesture or communicate)   Cognition Arousal: Lethargic Behavior During Therapy: Flat affect Cognition: Difficult to assess Difficult to assess due to: Non-English speaking, Hard of hearing/deaf           OT - Cognition Comments: Ot attempting to communicate with gestures, ASL and using mother in room. no response                 Following commands: Impaired Following commands impaired:  (not following commands)     Cueing  General Comments   Cueing Techniques: Gestural cues;Visual cues  HR 80-100 O2 99-100 RA   Exercises     Shoulder Instructions      Home Living Family/patient expects to be discharged to:: Private residence Living Arrangements: Spouse/significant other;Children;Parent;Other relatives Available Help at Discharge: Family;Available 24 hours/day Type of Home: House Home Access: Level entry   Entrance Stairs-Rails: Left;Right Home Layout: One level     Bathroom Shower/Tub: Chief Strategy Officer: Standard     Home Equipment: Agricultural consultant (2 wheels)   Additional Comments: lives with parents and siblings, 8 people total. Brother states since he went home after last admit had to have HHA of 2 - Per previous hospital admission, was working with [employer] and useing RW  Lives With: Family    Prior Functioning/Environment Prior Level of Function : Needs assist             Mobility Comments: when pt d/c from [facility] pt needing modA  for bed mobility and maxA for transfers, unable to ambulate      OT Problem List: Decreased strength;Decreased range of motion;Decreased activity tolerance;Impaired balance (sitting and/or standing);Impaired vision/perception;Decreased coordination;Decreased cognition;Decreased safety awareness;Decreased knowledge of use of DME or AE;Decreased knowledge of precautions;Impaired sensation;Impaired tone;Impaired UE functional use   OT Treatment/Interventions: Self-care/ADL training;Therapeutic exercise;Neuromuscular education;DME and/or AE instruction;Therapeutic activities;Patient/family education;Balance training      OT Goals(Current goals can be found in the care plan section)   Acute Rehab OT Goals Patient Stated Goal: none stated no communicatino attempted OT Goal Formulation: Patient unable to participate in goal setting Time For Goal Achievement: 04/27/24 Potential to Achieve Goals: Fair   OT Frequency:  Min 2X/week    Co-evaluation PT/OT/SLP Co-Evaluation/Treatment: Yes Reason for Co-Treatment: Necessary to address cognition/behavior during functional activity;To address functional/ADL transfers PT goals addressed during session: Mobility/safety with mobility OT goals addressed during session: ADL's and self-care      AM-PAC OT "6 Clicks" Daily Activity     Outcome Measure Help from another person eating meals?: Total Help from another  person taking care of personal grooming?: Total Help from another person toileting, which includes using toliet, bedpan, or urinal?: Total Help from another person bathing (including washing, rinsing, drying)?: Total Help from another person to put on and taking off regular upper body clothing?: Total Help from another person to put on and taking off regular lower body clothing?: Total 6 Click Score: 6   End of Session Nurse Communication: Mobility status;Precautions  Activity Tolerance: Patient tolerated treatment well Patient left: in  bed;with call bell/phone within reach;with bed alarm set;with family/visitor present  OT Visit Diagnosis: Unsteadiness on feet (R26.81);Muscle weakness (generalized) (M62.81)                Time: 4696-2952 OT Time Calculation (min): 14 min Charges:  OT General Charges $OT Visit: 1 Visit OT Evaluation $OT Eval Moderate Complexity: 1 Mod   Brynn, OTR/L  Acute Rehabilitation Services Office: [phone] .   Neomia Banner 04/13/2024, 6:31 PM

## 2024-04-13 NOTE — Progress Notes (Signed)
 [facility]                                                                               Shmuel Girgis, is a 33 y.o. 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  Patient was initially discharged with Eliquis  but due to failed treatment, it was switched to Coumadin , patient was discharged to rehab Per patient's family since he got discharged from the hospital he has been having increasing generalized weakness, lethargy and headache with poor p.o. intake, over the last few days he has becoming less and less responsive, so he was brought to the emergency department.   MRI brain showing diffuse cerebral edema with early central herniation syndrome, neurology was consulted, patient was started on antibiotics for possible bacterial meningitis, LP was opted out due to high INR and early herniation syndrome on MRI.  PCCM was consulted for aspiration and admission to ICU.  Patient was started on hypertonic saline and his mental status slightly improved. He was then transferred to [facility] on 04/13/2024.  Neurology on board.    Assessment & Plan   Acute encephalopathy and lethargy Diffuse cerebral edema with brain compression and central herniation syndrome Central venous sinus thrombosis s/p thrombectomy Patient currently on anticoagulation with Coumadin  Supratherapeutic INR, INR is 5.8 S/p hypertonic saline weaned off on 6/2. He was empirically started on broad-spectrum IV antibiotics for possible meningitis which were discontinued Follow-up hypercoagulable workup Neurology on board Therapy evaluations  have been done    Nutrition Patient is currently on dysphagia 1 diet    Estimated body mass index is 23.05 kg/m as calculated from the following:   Height as of 03/19/24: 5\' 4"  (1.626 m).   Weight as of this encounter: 60.9 kg.  Code Status: full code.  DVT Prophylaxis:  SCDs Start: 04/10/24 2232   Level of Care: Level of care: Progressive Family Communication:  family at bedside.   Disposition Plan:     Remains inpatient appropriate:  pending  Procedures:  MRI brain.   Consultants:   Neurology  PCCM.   Antimicrobials:   Anti-infectives (From admission, onward)    Start     Dose/Rate Route Frequency Ordered Stop   04/11/24 0800  vancomycin  (VANCOREADY) IVPB 750 mg/150 mL  Status:  Discontinued        750 mg 150 mL/hr over 60 Minutes Intravenous Every 8 hours 04/10/24 2155 04/11/24 1147   04/11/24 0800  cefTRIAXone  (ROCEPHIN ) 2 g in sodium chloride  0.9 % 100 mL IVPB  Status:  Discontinued        2 g 200 mL/hr over 30 Minutes Intravenous 2 times daily 04/11/24 0212 04/11/24 1147   04/11/24 0600  ampicillin (OMNIPEN) 2 g in sodium chloride  0.9 % 100 mL IVPB  Status:  Discontinued  2 g 300 mL/hr over 20 Minutes Intravenous Every 6 hours 04/11/24 0212 04/11/24 0217   04/11/24 0400  acyclovir  (ZOVIRAX ) 600 mg in dextrose  5 % 100 mL IVPB  Status:  Discontinued        600 mg 112 mL/hr over 60 Minutes Intravenous Every 8 hours 04/11/24 0211 04/11/24 1147   04/11/24 0400  ampicillin (OMNIPEN) 2 g in sodium chloride  0.9 % 100 mL IVPB  Status:  Discontinued        2 g 300 mL/hr over 20 Minutes Intravenous Every 4 hours 04/11/24 0217 04/11/24 1050   04/11/24 0215  Vancomycin  (VANCOCIN ) 1,250 mg in sodium chloride  0.9 % 250 mL IVPB  Status:  Discontinued        1,250 mg 166.7 mL/hr over 90 Minutes Intravenous Every 24 hours 04/11/24 0212 04/11/24 0216   04/10/24 2200  ampicillin (OMNIPEN) 2 g in sodium chloride  0.9 % 100 mL IVPB        2 g 300 mL/hr over 20 Minutes  Intravenous  Once 04/10/24 2149 04/10/24 2354   04/10/24 2200  Vancomycin  (VANCOCIN ) 1,250 mg in sodium chloride  0.9 % 250 mL IVPB        1,250 mg 166.7 mL/hr over 90 Minutes Intravenous  Once 04/10/24 2152 04/11/24 0218   04/10/24 2015  cefTRIAXone  (ROCEPHIN ) 2 g in sodium chloride  0.9 % 100 mL IVPB        2 g 200 mL/hr over 30 Minutes Intravenous  Once 04/10/24 2006 04/10/24 2100        Medications  Scheduled Meds:  atorvastatin   80 mg Oral Daily   feeding supplement  237 mL Oral BID BM   multivitamin with minerals  1 tablet Oral Daily   mouth rinse  15 mL Mouth Rinse 4 times per day   pantoprazole   40 mg Oral Daily   Continuous Infusions:  dextrose  5 % and 0.9 % NaCl 75 mL/hr at 04/13/24 0554   valproate sodium  250 mg (04/13/24 1502)   PRN Meds:.acetaminophen  **OR** acetaminophen , albuterol , HYDROmorphone  (DILAUDID ) injection, labetalol, mouth rinse, polyethylene glycol    Subjective:   Samuel Warren was seen and examined today.  No new events.   Objective:   Vitals:   04/13/24 0500 04/13/24 0539 04/13/24 0731 04/13/24 1218  BP: (!) 136/104 (!) 140/95 (!) 144/97 (!) 149/95  Pulse: 68 60 67 77  Resp:   14 20  Temp:   99 F (37.2 C) 99 F (37.2 C)  TempSrc:   Oral Oral  SpO2:   99% 99%  Weight: 60.9 kg       Intake/Output Summary (Last 24 hours) at 04/13/2024 1503 Last data filed at 04/13/2024 1300 Gross per 24 hour  Intake 865.02 ml  Output 1750 ml  Net -884.98 ml   Filed Weights   04/12/24 0438 04/13/24 0500  Weight: 60 kg 60.9 kg     Exam General exam: ill appearing gentleman, not in distress.  Respiratory system: Clear to auscultation. Respiratory effort normal. Cardiovascular system: S1 & S2 heard, RRR. No JVD Gastrointestinal system: Abdomen is SOFT bs+ Central nervous system: Alert , follows simple commands. Opens eyes to verbal cues.  Extremities: no edema.  Skin: No rashes, Psychiatry: calm.     Data Reviewed:  I have personally reviewed  following labs and imaging studies   CBC Lab Results  Component Value Date   WBC 10.5 04/12/2024   RBC 4.58 04/12/2024   HGB 12.2 (L) 04/12/2024   HCT 38.9 (L) 04/12/2024  MCV 84.9 04/12/2024   MCH 26.6 04/12/2024   PLT 219 04/12/2024   MCHC 31.4 04/12/2024   RDW 13.2 04/12/2024   LYMPHSABS 0.8 04/10/2024   MONOABS 0.7 04/10/2024   EOSABS 0.0 04/10/2024   BASOSABS 0.0 04/10/2024     Last metabolic panel Lab Results  Component Value Date   NA 145 04/12/2024   K 3.7 04/12/2024   CL 115 (H) 04/12/2024   CO2 18 (L) 04/12/2024   BUN 8 04/12/2024   CREATININE 0.64 04/12/2024   GLUCOSE 107 (H) 04/12/2024   GFRNONAA >60 04/12/2024   CALCIUM  8.8 (L) 04/12/2024   PHOS 2.7 04/12/2024   PROT 7.9 04/10/2024   ALBUMIN 4.5 04/10/2024   BILITOT 0.4 04/10/2024   ALKPHOS 57 04/10/2024   AST 19 04/10/2024   ALT 46 (H) 04/10/2024   ANIONGAP 12 04/12/2024    CBG (last 3)  No results for input(s): "GLUCAP" in the last 72 hours.    Coagulation Profile: Recent Labs  Lab 04/10/24 1905 04/11/24 0512 04/12/24 0859 04/13/24 0433  INR 4.1* 5.6* 7.2* 5.8*     Radiology Studies: CT HEAD WO CONTRAST ( ) Result Date: 04/12/2024 CLINICAL DATA:  33 year old male with complicated dural venous sinus thrombosis. Abnormal brain MRI yesterday suggesting cerebral edema. EXAM: CT HEAD WITHOUT CONTRAST TECHNIQUE: Contiguous axial images were obtained from the base of the skull through the vertex without intravenous contrast. RADIATION DOSE REDUCTION: This exam was performed according to the departmental dose-optimization program which includes automated exposure control, adjustment of the mA and/or kV according to patient size and/or use of iterative reconstruction technique. COMPARISON:  Brain MRI yesterday.  Head CT 03/23/2024 and earlier. FINDINGS: Brain: Compared to earliest noncontrast head CT on 01/25/2024 there remains loss of some basilar cisterns. But the brainstem and cerebellar  configuration appears improved from the MRI yesterday. The pontine shape appears more normal. Stable diminutive, probably compressed appearance of the ventricular system. No midline shift. No acute intracranial hemorrhage identified. There is maintained gray-white differentiation, with focal hypodensity in the left superior perirolandic subcortical white matter. No new cortically based infarct is identified. Vascular: Abnormal brain vasculature as described on the MRI yesterday. Mixed density in the posterior segment of the superior sagittal sinus again noted. Skull: Intact.  No acute osseous abnormality identified. Sinuses/Orbits: Stable left mastoid opacification and sclerosis over this series of exams. Paranasal Visualized paranasal sinuses and mastoids are stable and well aerated. Other: Visualized orbits and scalp soft tissues are within normal limits. IMPRESSION: 1. Loss of some basilar cisterns when compared to the earliest March CTs, but the brainstem configuration appears improved from the MRI yesterday. 2. Maintained gray-white differentiation, aside from the known left superior subcortical infarct. No intracranial hemorrhage, midline shift, or ventriculomegaly. Electronically Signed   By: Marlise Simpers M.D.   On: 04/12/2024 04:38       Feliciana Horn M.D. [facility] 04/13/2024, 3:03 PM  Available via Epic secure chat 7am-7pm After 7 pm, please refer to night coverage provider listed on amion.

## 2024-04-13 NOTE — Evaluation (Signed)
 Physical Therapy Evaluation Patient Details Name: Samuel Warren MRN: [id number] DOB: January 09, 1991 Today's Date: 04/13/2024  History of Present Illness  The pt is a 33 yo male presenting 5/31 from home with FTT. 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 MRI showed: early central herniation syndrome. 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   Clinical Impression  Pt in bed upon arrival of PT, agreeable to evaluation at this time. Prior to admission the pt had been requiring modA for bed mobility and maxA of 2 for transfers. After d/c home with family, pt with continued decline and progressive weakness needing increased assist. Pt initially with eyes closed and unable to maintain open upon arrival, but with continued stimulation maintained eyes open but without any attempt to communicate or follow commands. Pt did not respond to gestures, ASL, or tactile facilitation. Needing assist to manage all aspects of core and balance, including head/neck position, visually tracking to ~midline but can track into R visual field at times. Given significant decline in cognition and participation, recommend return to intensive therapies >3hours/day to facilitate maximal return to independence and decreased caregiver burden.     If plan is discharge home, recommend the following: Two people to help with walking and/or transfers;Two people to help with bathing/dressing/bathroom;Assistance with cooking/housework;Assistance with feeding;Direct supervision/assist for medications management;Direct supervision/assist for financial management;Assist for transportation;Help with stairs or ramp for entrance;Supervision due to cognitive status   Can travel by private vehicle        Equipment Recommendations Wheelchair (measurements  PT);Wheelchair cushion (measurements PT);Hoyer lift;Hospital bed  Recommendations for Other Services  Rehab consult    Functional Status Assessment Patient has had a recent decline in their functional status and demonstrates the ability to make significant improvements in function in a reasonable and predictable amount of time.     Precautions / Restrictions Precautions Precautions: Fall;Other (comment) Recall of Precautions/Restrictions: Impaired Precaution/Restrictions Comments: R hemiparesis with inattention Restrictions Weight Bearing Restrictions Per Provider Order: No      Mobility  Bed Mobility Overal bed mobility: Needs Assistance             General bed mobility comments: dependent on support of trunk in bed, unable to move head or adjust position, L head position but could reach midline with assist. deferred OOB mobility due to lack of responsiveness         Pertinent Vitals/Pain Pain Assessment Pain Assessment: Faces Pain Score: 0-No pain Faces Pain Scale: No hurt Pain Location: no reaction to noxious stimuli of hands, withdraws for LE Pain Intervention(s): Limited activity within patient's tolerance, Monitored during session    Home Living Family/patient expects to be discharged to:: Private residence Living Arrangements: Spouse/significant other;Children;Parent;Other relatives Available Help at Discharge: Family;Available 24 hours/day Type of Home: House Home Access: Level entry Entrance Stairs-Rails: Left;Right     Home Layout: One level Home Equipment: Agricultural consultant (2 wheels) Additional Comments: lives with parents and siblings, 8 people total. Brother states since he went home after last admit had to have HHA of 2 - Per previous hospital admission, was working with [employer] and useing RW    Prior Function Prior Level of Function : Needs assist             Mobility Comments: when pt d/c from [facility] pt needing modA for bed mobility and maxA  for transfers, unable to ambulate  Extremity/Trunk Assessment   Upper Extremity Assessment Upper Extremity Assessment: Defer to OT evaluation    Lower Extremity Assessment Lower Extremity Assessment: Generalized weakness;RLE deficits/detail;LLE deficits/detail RLE Deficits / Details: no active movement to command, wiggled toes to noxious stimuli. 3-8 beats clonus at ankle LLE Deficits / Details: no active movement to command, wiggled toes to noxious stimuli. sustained beats clonus at ankle    Cervical / Trunk Assessment Cervical / Trunk Assessment: Other exceptions Cervical / Trunk Exceptions: head turned to L  Communication   Communication Communication: Impaired Factors Affecting Communication: Hearing impaired;Other (comment) (no attempt to gesture or communicate)    Cognition Arousal: Lethargic Behavior During Therapy: Flat affect   PT - Cognitive impairments: Difficult to assess Difficult to assess due to: Hard of hearing/deaf, Impaired communication                     PT - Cognition Comments: pt initially with eyes closed (therefore unable to communicate) but once pt keeping eyes open, maintains L gaze, minimal traking and short attention. did not communicate with ASL at any point Following commands: Impaired Following commands impaired:  (not following commands)     Cueing       General Comments General comments (skin integrity, edema, etc.): HR 80-100, SpO2 100% on RA. mother present, with with no attempt to communicate with therapist or family    Exercises     Assessment/Plan    PT Assessment Patient needs continued PT services  PT Problem List Decreased activity tolerance;Decreased balance;Decreased mobility;Pain;Decreased cognition       PT Treatment Interventions Gait training;Stair training;Functional mobility training;Therapeutic activities;Therapeutic exercise;Balance training;Patient/family education;Neuromuscular re-education    PT  Goals (Current goals can be found in the Care Plan section)  Acute Rehab PT Goals Patient Stated Goal: none stated PT Goal Formulation: Patient unable to participate in goal setting Time For Goal Achievement: 04/27/24 Potential to Achieve Goals: Fair    Frequency Min 2X/week     Co-evaluation PT/OT/SLP Co-Evaluation/Treatment: Yes Reason for Co-Treatment: Necessary to address cognition/behavior during functional activity;To address functional/ADL transfers PT goals addressed during session: Mobility/safety with mobility         AM-PAC PT "6 Clicks" Mobility  Outcome Measure Help needed turning from your back to your side while in a flat bed without using bedrails?: Total Help needed moving from lying on your back to sitting on the side of a flat bed without using bedrails?: Total Help needed moving to and from a bed to a chair (including a wheelchair)?: Total Help needed standing up from a chair using your arms (e.g., wheelchair or bedside chair)?: Total Help needed to walk in hospital room?: Total Help needed climbing 3-5 steps with a railing? : Total 6 Click Score: 6    End of Session   Activity Tolerance: Patient limited by lethargy Patient left: in bed;with call bell/phone within reach;with bed alarm set Nurse Communication: Mobility status PT Visit Diagnosis: Other abnormalities of gait and mobility (R26.89)    Time: 3086-5784 PT Time Calculation (min) (ACUTE ONLY): 15 min   Charges:   PT Evaluation $PT Eval Moderate Complexity: 1 Mod   PT General Charges $$ ACUTE PT VISIT: 1 Visit        Barnabas Booth, PT, DPT   Acute Rehabilitation Department Office [phone] Secure Chat Communication Preferred  Lona Rist 04/13/2024, 4:48 PM

## 2024-04-13 NOTE — Progress Notes (Signed)
 Admission Notes:  22:20H Received patient from 4N on bed accompanied by nurse and nurse tech. With his mother during transfer. [name] and able to follow simple commands on cue and use gestures like nodding. Able to move but weak on left upper and lower extremities. With on-going IVF of D5LR at 75cc/hr on left forearm during transfer. Safety protocol initiated: bed wheels locked, side rails ups and call bell within reach. Mother oriented to room set-up and call bell use. Hooked to telebox# 02.

## 2024-04-14 DIAGNOSIS — M6281 Muscle weakness (generalized): Secondary | ICD-10-CM | POA: Insufficient documentation

## 2024-04-14 DIAGNOSIS — G9340 Encephalopathy, unspecified: Secondary | ICD-10-CM | POA: Diagnosis not present

## 2024-04-14 DIAGNOSIS — R5381 Other malaise: Secondary | ICD-10-CM | POA: Diagnosis not present

## 2024-04-14 DIAGNOSIS — G08 Intracranial and intraspinal phlebitis and thrombophlebitis: Secondary | ICD-10-CM | POA: Diagnosis not present

## 2024-04-14 DIAGNOSIS — G936 Cerebral edema: Secondary | ICD-10-CM | POA: Diagnosis not present

## 2024-04-14 LAB — PTT-LA INCUB MIX
PTT-LA Incub Mix: 39.8 s (ref 0.0–40.5)
PTT-LA Incub Mix: 36.8 s (ref 0.0–40.5)

## 2024-04-14 LAB — LUPUS ANTICOAGULANT PANEL
DRVVT: 107.2 s — ABNORMAL HIGH (ref 0.0–47.0)
PTT Lupus Anticoagulant: 54.1 s — ABNORMAL HIGH (ref 0.0–43.5)

## 2024-04-14 LAB — DRVVT MIX
dRVVT Mix: 44.5 s — ABNORMAL HIGH (ref 0.0–40.4)
dRVVT Mix: 47.6 s — ABNORMAL HIGH (ref 0.0–40.4)

## 2024-04-14 LAB — ANTIPHOSPHOLIPID SYNDROME EVAL, BLD
Anticardiolipin IgA: <9 U/mL (ref 0–11)
Anticardiolipin IgG: <9 GPL U/mL (ref 0–14)
Anticardiolipin IgM: 13 [MPL'U]/mL — ABNORMAL HIGH (ref 0–12)
DRVVT: 89.1 s — ABNORMAL HIGH (ref 0.0–47.0)
PTT Lupus Anticoagulant: 72.2 s — ABNORMAL HIGH (ref 0.0–43.5)
Phosphatydalserine, IgA: 1 {APS'U} (ref 0–19)
Phosphatydalserine, IgG: 9 U (ref 0–30)
Phosphatydalserine, IgM: 19 U (ref 0–30)

## 2024-04-14 LAB — SPOTTED FEVER GROUP ANTIBODIES
Spotted Fever Group IgG: <1:64 {titer}
Spotted Fever Group IgM: <1:64 {titer}

## 2024-04-14 LAB — PTT-LA MIX
PTT-LA Mix: 39 s (ref 0.0–40.5)
PTT-LA Mix: 35.4 s (ref 0.0–40.5)

## 2024-04-14 LAB — DRVVT CONFIRM
dRVVT Confirm: 0.9 ratio (ref 0.8–1.2)
dRVVT Confirm: 1.2 ratio (ref 0.8–1.2)

## 2024-04-14 LAB — ANA W/REFLEX IF POSITIVE: Anti Nuclear Antibody (ANA): NEGATIVE

## 2024-04-14 MED ORDER — ACETAMINOPHEN 500 MG PO TABS
500.0000 mg | ORAL_TABLET | Freq: Two times a day (BID) | ORAL | Status: DC
  Administered 2024-04-14 – 2024-04-16 (×4): 500 mg via ORAL
  Filled 2024-04-14 (×4): qty 1

## 2024-04-14 NOTE — Plan of Care (Signed)
  Problem: Respiratory: Goal: Ability to maintain adequate ventilation will improve Outcome: Progressing   Problem: Clinical Measurements: Goal: Respiratory complications will improve Outcome: Progressing   Problem: Nutrition: Goal: Adequate nutrition will be maintained Outcome: Progressing   Problem: Coping: Goal: Level of anxiety will decrease Outcome: Progressing   Problem: Elimination: Goal: Will not experience complications related to bowel motility Outcome: Progressing Goal: Will not experience complications related to urinary retention Outcome: Progressing   Problem: Safety: Goal: Ability to remain free from injury will improve Outcome: Progressing   Problem: Health Behavior/Discharge Planning: Goal: Ability to manage health-related needs will improve Outcome: Not Progressing   Problem: Activity: Goal: Risk for activity intolerance will decrease Outcome: Not Progressing   Problem: Skin Integrity: Goal: Risk for impaired skin integrity will decrease Outcome: Not Progressing

## 2024-04-14 NOTE — Progress Notes (Signed)
 Physical Therapy Treatment Patient Details Name: Samuel Warren MRN: [id number] DOB: 1991-03-17 Today's Date: 04/14/2024   History of Present Illness The pt is a 33 yo male presenting 5/31 from home with FTT. 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 MRI showed: early central herniation syndrome. PMH includes: stroke/extensive venous thrombosis, prior history of meningoencephalitis, hyperlipidemia and GERD; [place] and deaf at baseline, Chiari malformation.    PT Comments  Pt is progressing very slowly towards goals. Currently pt is total A for sitting EOB and requires Max A to Total A sitting EOB. Pt was unable to lift cervical spine out of flexion without significant pain at the posterior C7/T1 junction. Mother present throughout session. Due to pt current functional status, home set up and available assistance at home recommending skilled physical therapy services > 3 hours/day in order to address strength, balance and functional mobility to decrease risk for falls, injury, immobility, skin break down and re-hospitalization.     If plan is discharge home, recommend the following: Two people to help with walking and/or transfers;Assist for transportation;Help with stairs or ramp for entrance;Supervision due to cognitive status;Assistance with cooking/housework   Can travel by private vehicle     No  Equipment Recommendations  Wheelchair (measurements PT);Wheelchair cushion (measurements PT);Hoyer lift;Hospital bed       Precautions / Restrictions Precautions Precautions: Fall;Other (comment) Recall of Precautions/Restrictions: Impaired Precaution/Restrictions Comments: R hemiparesis with inattention Restrictions Weight Bearing Restrictions Per Provider Order: No     Mobility  Bed Mobility Overal bed mobility: Needs Assistance Bed Mobility: Supine to Sit, Sit to Supine     Supine to sit: Total assist Sit to supine:  Total assist   General bed mobility comments: Pt was able to try to initiate movement with heavy multi modal cues without movement at the LE or to reach for rail or push up into mid line. Pt had significant cervical flexion in sitting EOB and unable to lift up with pain in the cervical/thoracic junction with very limited assist into extension for upright sitting.        Balance Overall balance assessment: Needs assistance Sitting-balance support: No upper extremity supported, Feet supported Sitting balance-Leahy Scale: Zero Sitting balance - Comments: Max A to Total A for balancing sitting EOB.          Communication Communication Communication: Impaired Factors Affecting Communication: Hearing impaired  Cognition Arousal: Alert Behavior During Therapy: Flat affect   PT - Cognitive impairments: Difficult to assess Difficult to assess due to: Hard of hearing/deaf, Impaired communication       PT - Cognition Comments: improved tracking this session Following commands: Impaired Following commands impaired: Follows one step commands inconsistently, Follows one step commands with increased time    Cueing Cueing Techniques: Gestural cues, Visual cues, Tactile cues     General Comments General comments (skin integrity, edema, etc.): Pt attempted to perform knee extension on the L in sitting and UE ROM. Some R hand movement, no elbow/shoulder ROM and no noted movement in the RLE with multi modal cues. Mother present during session      Pertinent Vitals/Pain Pain Assessment Pain Assessment: Faces Faces Pain Scale: Hurts little more Breathing: normal Negative Vocalization: none Facial Expression: sad, frightened, frown Body Language: tense, distressed pacing, fidgeting Consolability: no need to console PAINAD Score: 2 Pain Location: head/abd has to go to the bathroom Pain Descriptors / Indicators: Aching Pain Intervention(s): Monitored during session, Limited activity within  patient's tolerance  PT Goals (current goals can now be found in the care plan section) Acute Rehab PT Goals Patient Stated Goal: none stated PT Goal Formulation: Patient unable to participate in goal setting Time For Goal Achievement: 04/27/24 Potential to Achieve Goals: Fair Progress towards PT goals: Progressing toward goals    Frequency    Min 2X/week      PT Plan  Continue with current POC         AM-PAC PT "6 Clicks" Mobility   Outcome Measure  Help needed turning from your back to your side while in a flat bed without using bedrails?: Total Help needed moving from lying on your back to sitting on the side of a flat bed without using bedrails?: Total Help needed moving to and from a bed to a chair (including a wheelchair)?: Total Help needed standing up from a chair using your arms (e.g., wheelchair or bedside chair)?: Total Help needed to walk in hospital room?: Total Help needed climbing 3-5 steps with a railing? : Total 6 Click Score: 6    End of Session   Activity Tolerance: Patient limited by fatigue;Other (comment) (limited by cognition) Patient left: in bed;with call bell/phone within reach;with bed alarm set;with family/visitor present Nurse Communication: Mobility status;Other (comment) (pt needs to use bedpan) PT Visit Diagnosis: Other abnormalities of gait and mobility (R26.89)     Time: 1017-1040 PT Time Calculation (min) (ACUTE ONLY): 23 min  Charges:    $Therapeutic Activity: 23-37 mins PT General Charges $$ ACUTE PT VISIT: 1 Visit                     Sloan Duncans, DPT, CLT  Acute Rehabilitation Services Office: [phone] (Secure chat preferred)    Jenice Mitts 04/14/2024, 10:48 AM

## 2024-04-14 NOTE — Progress Notes (Signed)
 Speech Language Pathology Treatment: Dysphagia  Patient Details Name: Samuel Warren MRN: [id number] DOB: 1991-11-02 Today's Date: 04/14/2024 Time: 1210-1222 SLP Time Calculation (min) (ACUTE ONLY): 12 min  Assessment / Plan / Recommendation Clinical Impression  Pt was alert today but with minimal interactions.  Needed visual/tactile cues to make eye contact and accept PO boluses.  There were limited responses today - presentation of cup/spoon/straw did not elicit movement or reaction. He did not draw liquid with straw placed in mouth.  Teaspoons sips of thin water trials led to immediate coughing.  Not ready to advance diet. Continue dysphagia 1/nectar liquids.  SLP will follow.   HPI HPI: Patient is a 33 y.o. male with PMH: childhood acquired deafness (knows limited ASL), recent cerebral vein thrombosis. 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 He was discharged to CIR. Since discharging home from CIR, patient has reportedly been having increasing generalized weakness, lethargy and headache with poor PO intake. He became less and less responsive and he was brought to ED on 04/10/24. 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 He has been NPO awaiting SLP swallow evaluation.      SLP Plan  Continue with current plan of care      Recommendations for follow up therapy are one component of a multi-disciplinary discharge planning process, led by the attending physician.  Recommendations may be updated based on patient status, additional functional criteria and insurance authorization.    Recommendations  Diet recommendations: Dysphagia 1 (puree);Nectar-thick liquid Liquids provided via: Cup;Straw Medication Administration: Whole meds with puree Supervision:  Trained caregiver to feed patient Compensations: Small sips/bites                  Oral care BID   Frequent or constant Supervision/Assistance Dysphagia, unspecified (R13.10)     Continue with current plan of care    Carleena Mires L. Beatris Lincoln, MA CCC/SLP Clinical Specialist - Acute Care SLP Acute Rehabilitation Services Office number [phone]  Myna Asal Laurice  04/14/2024, 12:28 PM

## 2024-04-14 NOTE — Plan of Care (Signed)
  Problem: Clinical Measurements: Goal: Diagnostic test results will improve Outcome: Progressing Goal: Signs and symptoms of infection will decrease Outcome: Progressing   Problem: Fluid Volume: Goal: Hemodynamic stability will improve Outcome: Progressing   Problem: Clinical Measurements: Goal: Ability to maintain clinical measurements within normal limits will improve Outcome: Progressing Goal: Will remain free from infection Outcome: Progressing Goal: Diagnostic test results will improve Outcome: Progressing Goal: Respiratory complications will improve Outcome: Progressing Goal: Cardiovascular complication will be avoided Outcome: Progressing   Problem: Nutrition: Goal: Adequate nutrition will be maintained Outcome: Progressing   Problem: Elimination: Goal: Will not experience complications related to bowel motility Outcome: Progressing Goal: Will not experience complications related to urinary retention Outcome: Progressing   Problem: Safety: Goal: Ability to remain free from injury will improve Outcome: Progressing   Problem: Pain Managment: Goal: General experience of comfort will improve and/or be controlled Outcome: Progressing   Problem: Skin Integrity: Goal: Risk for impaired skin integrity will decrease Outcome: Progressing

## 2024-04-14 NOTE — Progress Notes (Signed)
 Inpatient Rehab Admissions Coordinator:   Per therapy recommendations, patient was screened for CIR candidacy by Wandalee Gust, MS, CCC-SLP. At this time, Pt. is not at a level to tolerate CIR. However, Pt. may have potential to progress to becoming a potential CIR candidate, so CIR admissions team will follow and monitor for progress and participation with therapies and place consult order if Pt. appears to be an appropriate candidate.  If Pt. Does progress, will need to discuss case with rehab team, as pt. Was recently discharged from CIR and there were some issues with participation as well as reluctance from family to take him home. Please contact me with any questions.   Wandalee Gust, MS, CCC-SLP Rehab Admissions Coordinator  [phone] (celll) [phone] (office)

## 2024-04-14 NOTE — Hospital Course (Signed)
 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  Patient was initially discharged with Eliquis  but due to failed treatment, it was switched to Coumadin , patient was discharged to rehab.  Per patient's family since he got discharged from the hospital he has been having increasing generalized weakness, lethargy and headache with poor p.o. intake, over the last few days he has becoming less and less responsive, so he was brought to the emergency department.   MRI brain showing diffuse cerebral edema with early central herniation syndrome, neurology was consulted, patient was started on antibiotics for possible bacterial meningitis, LP was opted out due to high INR and early herniation syndrome on MRI.  PCCM was consulted for aspiration and admission to ICU.  Patient was started on hypertonic saline and his mental status slightly improved. He was then transferred to [facility] on 04/13/2024.  Neurology on board.      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 - He was empirically started on broad-spectrum IV antibiotics for possible meningitis which were discontinued Follow-up hypercoagulable workup Neurology on board Therapy evaluations have been done; if denied by CIR, will plan on return home with family  Dysphagia - continue on dysphagia level 1 diet; requires assistance feeding   Goals of care - concerned about his  recurrent hospitalizations and poor functional status -Declined from CIR -At risk for further decompensation with time especially if recurrent neurologic events -Will need to engage palliative care in the future if does have repeat hospitalization soon after discharge      Estimated body mass index is 23.05 kg/m as calculated from the following:   Height as of 03/19/24: 5\' 4"  (1.626 m).   Weight as of this encounter: 60.9 kg.

## 2024-04-14 NOTE — Progress Notes (Signed)
 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  Patient was initially discharged with Eliquis  but due to failed treatment, it was switched to Coumadin , patient was discharged to rehab.  Per patient's family since he got discharged from the hospital he has been having increasing generalized weakness, lethargy and headache with poor p.o. intake, over the last few days he has becoming less and less responsive, so he was brought to the emergency department.   MRI brain showing diffuse cerebral edema with early central herniation syndrome, neurology was consulted, patient was started on antibiotics for possible bacterial meningitis, LP was opted out due to high INR and early herniation syndrome on MRI.  PCCM was consulted for aspiration and admission to ICU.  Patient was started on hypertonic saline and his mental status slightly improved. He was then transferred to [facility] on 04/13/2024.  Neurology on board.      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 - He was empirically started on broad-spectrum IV antibiotics for possible meningitis which were discontinued Follow-up hypercoagulable workup Neurology on board Therapy evaluations have been done; if  denied by CIR, will plan on return home with family  Dysphagia - continue on dysphagia level 1 diet; requires assistance feeding   Goals of care - concerned about his recurrent hospitalizations and poor functional status -Declined from CIR -At risk for further decompensation with time especially if recurrent neurologic events -Will need to engage palliative care in the future if does have repeat hospitalization soon after discharge      Estimated body mass index is 23.05 kg/m as calculated from the following:   Height as of 03/19/24: 5\' 4"  (1.626 m).   Weight as of this encounter: 60.9 kg.  Interval History:  Seen bedside this afternoon with assistance of sign language interpreter to help speak with patient and Samuel Warren interpreter to talk with his mom who was also present bedside.  He was demonstrating very slow mentation but was able to follow some commands with assistance from the sign language interpreter.   Old records reviewed in assessment of this patient  Antimicrobials:   DVT prophylaxis:  SCDs Start: 04/10/24 2232   Code Status:   Code Status: Full Code  Mobility Assessment (Last 72 Hours)     Mobility Assessment     Row Name 04/14/24 1046 04/14/24 0800 04/14/24 0530 04/14/24 0409 04/14/24 0000   Does patient have an order for bedrest or is patient medically unstable -- Yes- Bedfast (Level 1) - Complete No - Continue assessment No - Continue assessment No - Continue assessment   What is the highest level of mobility based on the progressive mobility assessment? Level 1 (Bedfast) - Unable to balance while sitting on edge of bed Level 1 (Bedfast) - Unable to balance while  sitting on edge of bed Level 1 (Bedfast) - Unable to balance while sitting on edge of bed Level 1 (Bedfast) - Unable to balance while sitting on edge of bed Level 1 (Bedfast) - Unable to balance while sitting on edge of bed   Is the above level different from baseline mobility prior to current  illness? -- Yes - Recommend PT order Yes - Recommend PT order Yes - Recommend PT order Yes - Recommend PT order    Row Name 04/13/24 2223 04/13/24 2000 04/13/24 1947 04/13/24 1800 04/13/24 1600   Does patient have an order for bedrest or is patient medically unstable No - Continue assessment No - Continue assessment No - Continue assessment -- --   What is the highest level of mobility based on the progressive mobility assessment? Level 1 (Bedfast) - Unable to balance while sitting on edge of bed Level 1 (Bedfast) - Unable to balance while sitting on edge of bed Level 1 (Bedfast) - Unable to balance while sitting on edge of bed Level 1 (Bedfast) - Unable to balance while sitting on edge of bed Level 1 (Bedfast) - Unable to balance while sitting on edge of bed   Is the above level different from baseline mobility prior to current illness? Yes - Recommend PT order Yes - Recommend PT order Yes - Recommend PT order -- --    Row Name 04/13/24 0800 04/13/24 0555 04/13/24 0411 04/13/24 0203 04/13/24 0000   Does patient have an order for bedrest or is patient medically unstable Yes- Bedfast (Level 1) - Complete No - Continue assessment No - Continue assessment No - Continue assessment No - Continue assessment   What is the highest level of mobility based on the progressive mobility assessment? Level 1 (Bedfast) - Unable to balance while sitting on edge of bed Level 1 (Bedfast) - Unable to balance while sitting on edge of bed Level 1 (Bedfast) - Unable to balance while sitting on edge of bed Level 1 (Bedfast) - Unable to balance while sitting on edge of bed Level 1 (Bedfast) - Unable to balance while sitting on edge of bed   Is the above level different from baseline mobility prior to current illness? Yes - Recommend PT order Yes - Recommend PT order Yes - Recommend PT order Yes - Recommend PT order Yes - Recommend PT order    Row Name 04/12/24 22:30:05           Does patient have an order for bedrest or is  patient medically unstable No - Continue assessment       What is the highest level of mobility based on the progressive mobility assessment? Level 1 (Bedfast) - Unable to balance while sitting on edge of bed       Is the above level different from baseline mobility prior to current illness? Yes - Recommend PT order                Barriers to discharge: none Disposition Plan:  Home HH orders placed: TBD Status is: Inpt  Objective: Blood pressure 135/84, pulse 80, temperature 98.7 F (37.1 C), temperature source Axillary, resp. rate 18, weight 58.7 kg, SpO2 100%.  Examination:  Physical Exam Constitutional:      Comments: Chronically ill-appearing young adult man lying in bed in no distress  HENT:     Head: Normocephalic and atraumatic.     Mouth/Throat:     Mouth: Mucous membranes are moist.  Eyes:     Extraocular  Movements: Extraocular movements intact.  Cardiovascular:     Rate and Rhythm: Normal rate and regular rhythm.  Pulmonary:     Effort: Pulmonary effort is normal. No respiratory distress.     Breath sounds: Normal breath sounds. No wheezing.  Abdominal:     General: Bowel sounds are normal. There is no distension.     Palpations: Abdomen is soft.     Tenderness: There is no abdominal tenderness.  Musculoskeletal:        General: Normal range of motion.     Cervical back: Normal range of motion and neck supple.  Skin:    General: Skin is warm and dry.  Neurological:     Comments: Follows commands with help of sign language interpreter.  Slow and delayed mentation.      Consultants:  Neurology  Procedures:    Data Reviewed: No results found for this or any previous visit (from the past 24 hours).  I have reviewed pertinent nursing notes, vitals, labs, and images as necessary. I have ordered labwork to follow up on as indicated.  I have reviewed the last notes from staff over past 24 hours. I have discussed patient's care plan and test results with  nursing staff, CM/SW, and other staff as appropriate.  Time spent: Greater than 50% of the 55 minute visit was spent in counseling/coordination of care for the patient as laid out in the A&P.   LOS: 4 days   [name], MD [facility] 04/14/2024, 4:13 PM

## 2024-04-15 DIAGNOSIS — G08 Intracranial and intraspinal phlebitis and thrombophlebitis: Secondary | ICD-10-CM | POA: Diagnosis not present

## 2024-04-15 DIAGNOSIS — G936 Cerebral edema: Secondary | ICD-10-CM | POA: Diagnosis not present

## 2024-04-15 DIAGNOSIS — R5381 Other malaise: Secondary | ICD-10-CM | POA: Diagnosis not present

## 2024-04-15 DIAGNOSIS — G9340 Encephalopathy, unspecified: Secondary | ICD-10-CM | POA: Diagnosis not present

## 2024-04-15 LAB — CULTURE, BLOOD (ROUTINE X 2)
Culture: NO GROWTH
Culture: NO GROWTH
Special Requests: ADEQUATE

## 2024-04-15 LAB — ANTI-JO 1 ANTIBODY, IGG: Anti JO-1: <0.2 AI (ref 0.0–0.9)

## 2024-04-15 LAB — QUANTIFERON-TB GOLD PLUS (RQFGPL)
QuantiFERON Mitogen Value: 0.19 [IU]/mL
QuantiFERON Nil Value: 0.04 [IU]/mL
QuantiFERON TB1 Ag Value: 0.04 [IU]/mL
QuantiFERON TB2 Ag Value: 0.03 [IU]/mL

## 2024-04-15 LAB — QUANTIFERON-TB GOLD PLUS: QuantiFERON-TB Gold Plus: UNDETERMINED — AB

## 2024-04-15 LAB — ANCA TITERS
Atypical P-ANCA titer: <1:20 {titer}
C-ANCA: <1:20 {titer}
P-ANCA: <1:20 {titer}

## 2024-04-15 LAB — PROTIME-INR
INR: 2.4 — ABNORMAL HIGH (ref 0.8–1.2)
Prothrombin Time: 26.3 s — ABNORMAL HIGH (ref 11.4–15.2)

## 2024-04-15 MED ORDER — WARFARIN - PHARMACIST DOSING INPATIENT: Freq: Every day | Status: DC

## 2024-04-15 MED ORDER — WARFARIN SODIUM 3 MG PO TABS
3.0000 mg | ORAL_TABLET | Freq: Once | ORAL | Status: AC
  Administered 2024-04-15: 3 mg via ORAL
  Filled 2024-04-15: qty 1

## 2024-04-15 NOTE — TOC Progression Note (Addendum)
 Transition of Care [facility]) - Progression Note    Patient Details  Name: Samuel Warren MRN: [id number] Date of Birth: 1990-12-17  [facility] CM/SW Contact  Jonathan Neighbor, RN Phone Number: 04/15/2024, 1:55 PM  Clinical Narrative:     Plan is for pt to d/c home tomorrow with RN/aide if able to staff with [facility] agency.  CM met with the mother and we call her younger son who speak [place]. He says they have recommended DME at home. They are agreeable to [facility]. No preference on agency.  Pt will need PTAR home.  TOC following.  1539: [facility] accepted for [facility] services. Information added to AVS.  Expected Discharge Plan: Home w Home Health Services Barriers to Discharge: Continued Medical Work up  Expected Discharge Plan and Services   Discharge Planning Services: CM Consult Post Acute Care Choice: Home Health Living arrangements for the past 2 months: Single Family Home                           HH Arranged: RN, Nurse's Aide           Social Determinants of Health (SDOH) Interventions SDOH Screenings   Food Insecurity: Patient Unable To Answer (04/11/2024)  Housing: Patient Unable To Answer (04/11/2024)  Transportation Needs: Patient Unable To Answer (04/11/2024)  Utilities: Patient Unable To Answer (04/11/2024)  Social Connections: Patient Declined (02/08/2024)  Tobacco Use: Low Risk  (04/04/2024)    Readmission Risk Interventions    03/18/2024    2:52 PM 01/29/2024   11:53 AM  Readmission Risk Prevention Plan  Post Dischage Appt  Complete  Medication Screening  Complete  Transportation Screening Complete Complete  HRI or Home Care Consult Complete   Medication Review Ocean[employer]) Complete

## 2024-04-15 NOTE — Progress Notes (Signed)
 PT Cancellation Note  Patient Details Name: Samuel Warren MRN: [id number] DOB: 07/24/1991   Cancelled Treatment:    Reason Eval/Treat Not Completed: Pain limiting ability to participate;Fatigue/lethargy limiting ability to participate (pt is fatigued and has a headache. Declined physical therapy at this time. Eyes open but not responding to therapist cues for initiation. Pt faintly nodded no when signed to sit/stand. Will continue to follow up as able/appropriate.)  Sloan Duncans, DPT, CLT  Acute Rehabilitation Services Office: [phone] (Secure chat preferred)   Jenice Mitts 04/15/2024, 4:04 PM

## 2024-04-15 NOTE — Plan of Care (Signed)
  Problem: Fluid Volume: Goal: Hemodynamic stability will improve Outcome: Progressing   Problem: Clinical Measurements: Goal: Diagnostic test results will improve Outcome: Progressing Goal: Signs and symptoms of infection will decrease Outcome: Progressing   Problem: Respiratory: Goal: Ability to maintain adequate ventilation will improve Outcome: Progressing   Problem: Education: Goal: Knowledge of General Education information will improve Description: Including pain rating scale, medication(s)/side effects and non-pharmacologic comfort measures Outcome: Progressing   Problem: Health Behavior/Discharge Planning: Goal: Ability to manage health-related needs will improve Outcome: Progressing   Problem: Clinical Measurements: Goal: Ability to maintain clinical measurements within normal limits will improve Outcome: Progressing Goal: Will remain free from infection Outcome: Progressing Goal: Diagnostic test results will improve Outcome: Progressing Goal: Respiratory complications will improve Outcome: Progressing Goal: Cardiovascular complication will be avoided Outcome: Progressing   Problem: Coping: Goal: Level of anxiety will decrease Outcome: Progressing   Problem: Elimination: Goal: Will not experience complications related to bowel motility Outcome: Progressing Goal: Will not experience complications related to urinary retention Outcome: Progressing   Problem: Pain Managment: Goal: General experience of comfort will improve and/or be controlled Outcome: Progressing   Problem: Safety: Goal: Ability to remain free from injury will improve Outcome: Progressing   Problem: Skin Integrity: Goal: Risk for impaired skin integrity will decrease Outcome: Progressing

## 2024-04-15 NOTE — Progress Notes (Signed)
 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  Patient was initially discharged with Eliquis  but due to failed treatment, it was switched to Coumadin , patient was discharged to rehab.  Per patient's family since he got discharged from the hospital he has been having increasing generalized weakness, lethargy and headache with poor p.o. intake, over the last few days he has becoming less and less responsive, so he was brought to the emergency department.   MRI brain showing diffuse cerebral edema with early central herniation syndrome, neurology was consulted, patient was started on antibiotics for possible bacterial meningitis, LP was opted out due to high INR and early herniation syndrome on MRI.  PCCM was consulted for aspiration and admission to ICU.  Patient was started on hypertonic saline and his mental status slightly improved. He was then transferred to [facility] on 04/13/2024.  Neurology on board.      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 - He was empirically started on broad-spectrum IV antibiotics for possible meningitis which were discontinued Follow-up hypercoagulable workup Neurology on board - appreciate PT/OT evals; denied by CIR;  not a good SNF candidate due to language/cognition barriers; discussed with mother bedside and plan is for home with care of family on 6/6 - INR improved today, 2.4.  Okay to resume Coumadin  with goal 2.5-3.5  Goals of care - concerned about his recurrent hospitalizations and poor functional status -Declined from CIR -At risk for further decompensation with time especially if recurrent neurologic events -Will need to engage palliative care in the future if does have repeat hospitalization soon after discharge  - Mother seemed overwhelmed about taking him home and we discussed that if he does become rehospitalized with further decline, we will need to consider palliative care discussions at that time  Dysphagia - continue on dysphagia level 1 diet; requires assistance feeding  - SLP following     Estimated body mass index is 23.05 kg/m as calculated from the following:   Height as of 03/19/24: 5\' 4"  (1.626 m).   Weight as of this encounter: 60.9 kg.  Interval History:  Again rounded on patient with his mother bedside with the assistance of language interpreter & sign language interpreter. Plan will be for discharging patient home tomorrow in care of family.   Old records reviewed in assessment of this patient  Antimicrobials:   DVT prophylaxis:  SCDs Start: 04/10/24 2232 warfarin (COUMADIN ) tablet 3 mg   Code Status:   Code Status: Full Code  Mobility Assessment (Last 72 Hours)     Mobility Assessment     Row Name 04/15/24 0933 04/14/24 1956 04/14/24 1046 04/14/24 0800 04/14/24 0530   Does patient have an order for bedrest or is patient medically unstable No - Continue assessment Yes- Bedfast (Level 1) -  Complete -- Yes- Bedfast (Level 1) - Complete No - Continue assessment   What is the highest level of mobility based on the progressive mobility assessment? Level 1 (Bedfast) - Unable to balance while sitting on edge of bed Level 1 (Bedfast) - Unable to balance while sitting  on edge of bed Level 1 (Bedfast) - Unable to balance while sitting on edge of bed Level 1 (Bedfast) - Unable to balance while sitting on edge of bed Level 1 (Bedfast) - Unable to balance while sitting on edge of bed   Is the above level different from baseline mobility prior to current illness? Yes - Recommend PT order Yes - Recommend PT order -- Yes - Recommend PT order Yes - Recommend PT order    Row Name 04/14/24 0409 04/14/24 0000 04/13/24 2223 04/13/24 2000 04/13/24 1947   Does patient have an order for bedrest or is patient medically unstable No - Continue assessment No - Continue assessment No - Continue assessment No - Continue assessment No - Continue assessment   What is the highest level of mobility based on the progressive mobility assessment? Level 1 (Bedfast) - Unable to balance while sitting on edge of bed Level 1 (Bedfast) - Unable to balance while sitting on edge of bed Level 1 (Bedfast) - Unable to balance while sitting on edge of bed Level 1 (Bedfast) - Unable to balance while sitting on edge of bed Level 1 (Bedfast) - Unable to balance while sitting on edge of bed   Is the above level different from baseline mobility prior to current illness? Yes - Recommend PT order Yes - Recommend PT order Yes - Recommend PT order Yes - Recommend PT order Yes - Recommend PT order    Row Name 04/13/24 1800 04/13/24 1600 04/13/24 0800 04/13/24 0555 04/13/24 0411   Does patient have an order for bedrest or is patient medically unstable -- -- Yes- Bedfast (Level 1) - Complete No - Continue assessment No - Continue assessment   What is the highest level of mobility based on the progressive mobility assessment? Level 1 (Bedfast) - Unable to balance while sitting on edge of bed Level 1 (Bedfast) - Unable to balance while sitting on edge of bed Level 1 (Bedfast) - Unable to balance while sitting on edge of bed Level 1 (Bedfast) - Unable to balance while sitting on edge of bed Level 1 (Bedfast) - Unable to  balance while sitting on edge of bed   Is the above level different from baseline mobility prior to current illness? -- -- Yes - Recommend PT order Yes - Recommend PT order Yes - Recommend PT order    Row Name 04/13/24 0203 04/13/24 0000 04/12/24 22:30:05       Does patient have an order for bedrest or is patient medically unstable No - Continue assessment No - Continue assessment No - Continue assessment     What is the highest level of mobility based on the progressive mobility assessment? Level 1 (Bedfast) - Unable to balance while sitting on edge of bed Level 1 (Bedfast) - Unable to balance while sitting on edge of bed Level 1 (Bedfast) - Unable to balance while sitting on edge of bed     Is the above level different from baseline mobility prior to current illness? Yes - Recommend PT order Yes - Recommend PT order Yes - Recommend PT order              Barriers to discharge: none Disposition Plan:  Home HH orders placed: TBD Status is: Inpt  Objective: Blood pressure (!) (P) 132/103, pulse 81, temperature 98.6 F (37 C), temperature source Oral, resp. rate 16, weight 60.4 kg, SpO2 99%.  Examination:  Physical Exam Constitutional:      Comments: Chronically ill-appearing young adult man lying in bed in no distress  HENT:     Head: Normocephalic and atraumatic.     Mouth/Throat:     Mouth: Mucous membranes are moist.  Eyes:     Extraocular Movements: Extraocular movements intact.  Cardiovascular:     Rate and Rhythm: Normal rate and regular rhythm.  Pulmonary:     Effort: Pulmonary effort is normal. No respiratory distress.     Breath sounds: Normal breath sounds. No wheezing.  Abdominal:     General: Bowel sounds are normal. There is no distension.     Palpations: Abdomen is soft.     Tenderness: There is no abdominal tenderness.  Musculoskeletal:        General: Normal range of motion.     Cervical back: Normal range of motion and neck supple.  Skin:    General: Skin  is warm and dry.  Neurological:     Comments: Follows commands with help of sign language interpreter.  Slow and delayed mentation.  Does not gesture back with any sign language to interpreter      Consultants:  Neurology  Procedures:    Data Reviewed: Results for orders placed or performed during the hospital encounter of 04/10/24 (from the past 24 hours)  Protime-INR     Status: Abnormal   Collection Time: 04/15/24  6:16 AM  Result Value Ref Range   Prothrombin Time 26.3 (H) 11.4 - 15.2 seconds   INR 2.4 (H) 0.8 - 1.2    I have reviewed pertinent nursing notes, vitals, labs, and images as necessary. I have ordered labwork to follow up on as indicated.  I have reviewed the last notes from staff over past 24 hours. I have discussed patient's care plan and test results with nursing staff, CM/SW, and other staff as appropriate.  Time spent: Greater than 50% of the 55 minute visit was spent in counseling/coordination of care for the patient as laid out in the A&P.   LOS: 5 days   [name], MD [facility] 04/15/2024, 3:30 PM

## 2024-04-15 NOTE — Progress Notes (Signed)
 Pharmacy Consult for Warfarin Indication: dural venous sinus thrombosis/stroke   No Known Allergies  Patient Measurements: Weight: 60.4 kg (133 lb 2.5 oz)    Vital Signs: Temp: 98.6 F (37 C) (06/05 1206) Temp Source: Oral (06/05 1206) BP: (P) 132/103 (06/05 1206) Pulse Rate: 81 (06/05 1206)  Labs: Recent Labs    04/13/24 0433 04/15/24 0616  LABPROT 52.0* 26.3*  INR 5.8* 2.4*    Estimated Creatinine Clearance: 108.9 mL/min (by C-G formula based on SCr of 0.64 mg/dL).  Assessment: Samuel Warren a 33 y.o. 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 Pateint presented with supratherapeutic INR of 4.1 (peak of 7.2)- last dose of warfarin 5/30 per med rec. After discussion with MD, will plan to resume warfarin today with a goal of 2.5-3.5. INR today 2.4.   Anticoagulation PTA: warfarin 3 mg PO daily   Goal of Therapy:  Heparin  level 0.3-0.7 units/ml Monitor platelets by anticoagulation protocol: Yes   Plan:  Warfarin 3 mg PO x1  INR/CBC daily   Chrystie Crass, PharmD Clinical Pharmacist  04/15/2024 2:28 PM

## 2024-04-15 NOTE — Progress Notes (Signed)
 Speech Language Pathology Treatment: Dysphagia  Patient Details Name: Samuel Warren MRN: [id number] DOB: Jan 03, 1991 Today's Date: 04/15/2024 Time: 1308-6578 SLP Time Calculation (min) (ACUTE ONLY): 12 min  Assessment / Plan / Recommendation Clinical Impression  Pt continues with minimal interactions this afternoon; responds best to his mother, making eye contact and following her cues to drink.  Provided trials of thin liquids, which he accepted from her with no coughing, no further concerns for aspiration or discomfort. He drank water and apple juice from a straw with total assist.    Recommend continuing dysphagia 1 diet; allow thin liquids; meds whole in puree.  Posted updated sign at [facility].   HPI HPI: Patient is a 33 y.o. male with PMH: childhood acquired deafness (knows limited ASL), recent cerebral vein thrombosis. 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 He was discharged to [facility]. Since discharging home from CIR, patient has reportedly been having increasing generalized weakness, lethargy and headache with poor PO intake. He became less and less responsive and he was brought to ED on 04/10/24. 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      SLP Plan  Continue with current plan of care          Recommendations  Diet recommendations: Dysphagia 1 (puree);Thin liquid Liquids provided via: Cup;Straw Medication Administration: Whole meds with puree Supervision: Trained caregiver to feed patient                  Oral care BID     Dysphagia, unspecified (R13.10)     Continue with current plan of care    Iliza Blankenbeckler L. Beatris Lincoln, MA CCC/SLP Clinical Specialist - Acute Care SLP Acute Rehabilitation Services Office number [phone]  Myna Asal Laurice  04/15/2024, 2:27 PM

## 2024-04-16 ENCOUNTER — Other Ambulatory Visit (HOSPITAL_COMMUNITY): Payer: Self-pay

## 2024-04-16 DIAGNOSIS — G9340 Encephalopathy, unspecified: Secondary | ICD-10-CM | POA: Diagnosis not present

## 2024-04-16 DIAGNOSIS — G936 Cerebral edema: Secondary | ICD-10-CM | POA: Diagnosis not present

## 2024-04-16 DIAGNOSIS — R5381 Other malaise: Secondary | ICD-10-CM | POA: Diagnosis not present

## 2024-04-16 DIAGNOSIS — G08 Intracranial and intraspinal phlebitis and thrombophlebitis: Secondary | ICD-10-CM | POA: Diagnosis not present

## 2024-04-16 LAB — CBC
HCT: 43.2 % (ref 39.0–52.0)
Hemoglobin: 13.6 g/dL (ref 13.0–17.0)
MCH: 26 pg (ref 26.0–34.0)
MCHC: 31.5 g/dL (ref 30.0–36.0)
MCV: 82.4 fL (ref 80.0–100.0)
Platelets: 278 10*3/uL (ref 150–400)
RBC: 5.24 MIL/uL (ref 4.22–5.81)
RDW: 13.2 % (ref 11.5–15.5)
WBC: 7.6 10*3/uL (ref 4.0–10.5)
nRBC: 0 % (ref 0.0–0.2)

## 2024-04-16 LAB — PROTHROMBIN GENE MUTATION

## 2024-04-16 LAB — BRUCELLA ANTIBODY IGG, EIA: Brucella Antibody IgG, EIA: NEGATIVE

## 2024-04-16 LAB — BRUCELLA ANTIBODY IGM, EIA: Brucella Antibody IgM, EIA: NEGATIVE

## 2024-04-16 LAB — PROTIME-INR
INR: 2.1 — ABNORMAL HIGH (ref 0.8–1.2)
Prothrombin Time: 24 s — ABNORMAL HIGH (ref 11.4–15.2)

## 2024-04-16 MED ORDER — WARFARIN SODIUM 2.5 MG PO TABS
2.5000 mg | ORAL_TABLET | Freq: Every day | ORAL | 3 refills | Status: DC
  Filled 2024-04-16: qty 30, 30d supply, fill #0

## 2024-04-16 MED ORDER — MIDODRINE HCL 5 MG PO TABS: 5.0000 mg | ORAL_TABLET | Freq: Three times a day (TID) | ORAL | Status: DC | PRN

## 2024-04-16 NOTE — Progress Notes (Signed)
 DISCHARGE NOTE HOME Kaisei Kozma to be discharged Home per MD order. Discussed prescriptions and follow up appointments with the patient. Prescriptions given to patient; medication list explained in detail. Patient verbalized understanding.  Skin clean, dry and intact without evidence of skin break down, no evidence of skin tears noted. IV catheter discontinued intact. Site without signs and symptoms of complications. Dressing and pressure applied. Pt denies pain at the site currently. No complaints noted.  Patient free of lines, drains, and wounds.   An After Visit Summary (AVS) was printed and given to the patient's brother. Patient transported home via [facility].   Migel Hannis K Griff Badley, RN

## 2024-04-16 NOTE — Plan of Care (Signed)
  Problem: Fluid Volume: Goal: Hemodynamic stability will improve Outcome: Progressing   Problem: Respiratory: Goal: Ability to maintain adequate ventilation will improve Outcome: Progressing   Problem: Education: Goal: Knowledge of General Education information will improve Description: Including pain rating scale, medication(s)/side effects and non-pharmacologic comfort measures Outcome: Progressing   Problem: Clinical Measurements: Goal: Will remain free from infection Outcome: Progressing   Problem: Clinical Measurements: Goal: Respiratory complications will improve Outcome: Progressing   Problem: Activity: Goal: Risk for activity intolerance will decrease Outcome: Progressing   Problem: Nutrition: Goal: Adequate nutrition will be maintained Outcome: Progressing

## 2024-04-16 NOTE — Discharge Summary (Signed)
 Physician Discharge Summary   Guhan Bruington ZOX:[id number] DOB: 03/15/1991 DOA: 04/10/2024  PCP: Alto Atta, NP  Admit date: 04/10/2024 Discharge date: 04/16/2024  Admitted From: Home Disposition:  Home Discharging physician: [name], MD Barriers to discharge: none  Recommendations at discharge: Palliative care referral placed at discharge; concerned for further decline with already poor QOL  Home Health: RN, aide   Discharge Condition: stable CODE STATUS: Full  Diet recommendation:  Diet Orders (From admission, onward)     Start     Ordered   04/15/24 1424  DIET - DYS 1 Room service appropriate? No; Fluid consistency: Thin  Diet effective now       Comments: Meds whole in puree; needs to be fed  Question Answer Comment  Room service appropriate? No   Fluid consistency: Thin      04/15/24 1424            Hospital Course: Mr. 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  Patient was initially discharged with Eliquis  but due to failed treatment, it was switched to Coumadin , patient was discharged to rehab.  Per patient's family since he got discharged from the hospital he has been having increasing generalized weakness, lethargy and headache with poor p.o. intake, over the last few days he has becoming less and less responsive, so he was brought to the emergency department.   MRI brain showing diffuse cerebral edema with early central herniation syndrome, neurology was consulted, patient was started on antibiotics for possible bacterial meningitis, LP was opted out due to high INR and early herniation syndrome on MRI.  PCCM was consulted for aspiration and admission to ICU.  Patient was started on hypertonic saline and his mental status  slightly improved. He was then transferred to [facility] on 04/13/2024.  Neurology consulted as well.      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 - He was empirically started on broad-spectrum IV antibiotics for possible meningitis which were discontinued Follow-up hypercoagulable workup Neurology on board - appreciate PT/OT evals; denied by CIR; not a good SNF candidate due to language/cognition barriers; discussed with mother bedside and plan is for home with care of family on 6/6 - INR improved today, 2.4.  Okay to resume Coumadin  with goal 2.5-3.5 - coumadin  dose decreased to 2.5 mg daily; repeat INR around 6/9; supratherapeutic level on admission possibly in setting of his decreased oral intake and he may need further adjustments as time goes forward  Goals of care - concerned about his recurrent hospitalizations and poor functional status -Declined from CIR -At risk for further decompensation with time especially if recurrent neurologic events -Referral placed to palliative care at discharge.  Very honest discussion held with mother and brother bedside prior to discharge that he may continue to progressively decline.  Eventually the focus would need to be quality of life and comfort care at that time.  He is essentially bedbound at this point requiring  full ADL assistance  Dysphagia - continue on dysphagia level 1 diet; requires assistance feeding  - SLP evaluated     Estimated body mass index is 23.05 kg/m as calculated from the following:   Height as of 03/19/24: 5\' 4"  (1.626 m).   Weight as of this  encounter: 60.9 kg.   Principal Diagnosis: Cerebral edema [facility])  Discharge Diagnoses: Active Hospital Problems   Diagnosis Date Noted   Cerebral venous sinus thrombosis 04/14/2024    Priority: 2.   Declining functional status 04/14/2024    Priority: 2.   Generalized muscle weakness 04/14/2024   Physical deconditioning 04/14/2024    Resolved Hospital Problems   Diagnosis Date Noted Date Resolved   Cerebral edema [facility]) 04/11/2024 04/14/2024    Priority: 1.   Encephalopathy acute 04/10/2024 04/14/2024    Priority: 1.     Discharge Instructions     Amb Referral to Palliative Care   Complete by: As directed    Increase activity slowly   Complete by: As directed       Allergies as of 04/16/2024   No Known Allergies      Medication List     TAKE these medications    acetaminophen  325 MG tablet Commonly known as: TYLENOL  Take 2 tablets (650 mg total) by mouth every 4 (four) hours as needed for mild pain (pain score 1-3) (or temp > 37.5 C (99.5 F)).   amitriptyline  25 MG tablet Commonly known as: ELAVIL  Take 2 tablets (50 mg total) by mouth at bedtime.   atorvastatin  80 MG tablet Commonly known as: LIPITOR  Take 1 tablet (80 mg total) by mouth daily.   divalproex  250 MG DR tablet Commonly known as: DEPAKOTE  Take 1 tablet (250 mg total) by mouth every 12 (twelve) hours.   lidocaine  5 % Commonly known as: LIDODERM  Place 2 patches onto the skin daily. Remove & Discard patch within 12 hours or as directed by MD   methocarbamol  750 MG tablet Commonly known as: ROBAXIN  Take 1 tablet (750 mg total) by mouth every 6 (six) hours as needed for muscle spasms.   midodrine  5 MG tablet Commonly known as: PROAMATINE  Take 1 tablet (5 mg total) by mouth 3 (three) times daily as needed. If blood pressure less than 90/60 What changed:  when to take this reasons to take this additional instructions   multivitamin with minerals Tabs tablet Take 1 tablet by mouth daily.    pantoprazole  40 MG tablet Commonly known as: PROTONIX  Take 1 tablet (40 mg total) by mouth daily.   polyethylene glycol 17 g packet Commonly known as: MIRALAX  / GLYCOLAX  Take 17 g by mouth daily as needed.   tiZANidine  4 MG tablet Commonly known as: ZANAFLEX  Take 1 tablet (4 mg total) by mouth at bedtime.   warfarin 2.5 MG tablet Commonly known as: COUMADIN  Take 1 tablet (2.5 mg total) by mouth daily at 4 PM. What changed:  medication strength how much to take        Follow-up Information     [facility] Follow up.   Why: [phone] [facility] will contact you for the first home visit.               No Known Allergies  Consultations: Neurology  Procedures:   Discharge Exam: BP (!) 135/98 (BP Location: Right Arm)   Pulse 92   Temp 98.2 F (36.8 C) (Oral)   Resp 18   Wt 59.4 kg   SpO2 96%  BMI 22.48 kg/m  Physical Exam Constitutional:      Comments: Chronically ill-appearing young adult man lying in bed in no distress  HENT:     Head: Normocephalic and atraumatic.     Mouth/Throat:     Mouth: Mucous membranes are moist.  Eyes:     Extraocular Movements: Extraocular movements intact.  Cardiovascular:     Rate and Rhythm: Normal rate and regular rhythm.  Pulmonary:     Effort: Pulmonary effort is normal. No respiratory distress.     Breath sounds: Normal breath sounds. No wheezing.  Abdominal:     General: Bowel sounds are normal. There is no distension.     Palpations: Abdomen is soft.     Tenderness: There is no abdominal tenderness.  Musculoskeletal:        General: Normal range of motion.     Cervical back: Normal range of motion and neck supple.  Skin:    General: Skin is warm and dry.  Neurological:     Comments: Follows commands with help of sign language interpreter.  Slow and delayed mentation.  Does not gesture back with any sign language to interpreter      The results of significant diagnostics from this  hospitalization (including imaging, microbiology, ancillary and laboratory) are listed below for reference.   Microbiology: Recent Results (from the past 240 hours)  Blood Culture (routine x 2)     Status: None   Collection Time: 04/10/24  7:02 PM   Specimen: BLOOD  Result Value Ref Range Status   Specimen Description BLOOD SITE NOT SPECIFIED  Final   Special Requests   Final    BOTTLES DRAWN AEROBIC AND ANAEROBIC Blood Culture adequate volume   Culture   Final    NO GROWTH 5 DAYS Performed at [facility], [address], [place], [place] [address]    Report Status 04/15/2024 FINAL  Final  Blood Culture (routine x 2)     Status: None   Collection Time: 04/10/24  9:29 PM   Specimen: BLOOD LEFT HAND  Result Value Ref Range Status   Specimen Description BLOOD LEFT HAND  Final   Special Requests   Final    BOTTLES DRAWN AEROBIC ONLY Blood Culture results may not be optimal due to an inadequate volume of blood received in culture bottles   Culture   Final    NO GROWTH 5 DAYS Performed at [facility], [address], [place], [place] [address]    Report Status 04/15/2024 FINAL  Final  MRSA Next Gen by PCR, Nasal     Status: None   Collection Time: 04/11/24  9:52 AM   Specimen: Nasal Mucosa; Nasal Swab  Result Value Ref Range Status   MRSA by PCR Next Gen NOT DETECTED NOT DETECTED Final    Comment: (NOTE) The GeneXpert MRSA Assay (FDA approved for NASAL specimens only), is one component of a comprehensive MRSA colonization surveillance program. It is not intended to diagnose MRSA infection nor to guide or monitor treatment for MRSA infections. Test performance is not FDA approved in patients less than 69 years old. Performed at [facility], [address], [place], [place] [address]      Labs: BNP (last 3 results) No results for input(s): "BNP" in the last 8760 hours. Basic Metabolic Panel: Recent Labs  Lab 04/10/24 1905 04/10/24 1916 04/11/24 0833  04/11/24 1336 04/11/24 2021 04/12/24 0842 04/12/24 1401  NA 138 141 141 143 145  145 145  K 3.6 3.7  --   --   --  3.7  --   CL 103 104  --   --   --  115*  --   CO2 23  --   --   --   --  18*  --   GLUCOSE 146* 151*  --   --   --  107*  --   BUN 9 10  --   --   --  8  --   CREATININE 0.81 0.80  --   --   --  0.64  --   CALCIUM  10.2  --   --   --   --  8.8*  --   MG  --   --   --   --   --  2.0  --   PHOS  --   --   --   --   --  2.7  --    Liver Function Tests: Recent Labs  Lab 04/10/24 1905  AST 19  ALT 46*  ALKPHOS 57  BILITOT 0.4  PROT 7.9  ALBUMIN 4.5   No results for input(s): "LIPASE", "AMYLASE" in the last 168 hours. No results for input(s): "AMMONIA" in the last 168 hours. CBC: Recent Labs  Lab 04/10/24 1905 04/10/24 1916 04/12/24 0625 04/16/24 0344  WBC 15.0*  --  10.5 7.6  NEUTROABS 13.3*  --   --   --   HGB 13.8 15.0 12.2* 13.6  HCT 43.2 44.0 38.9* 43.2  MCV 83.6  --  84.9 82.4  PLT 275  --  219 278   Cardiac Enzymes: No results for input(s): "CKTOTAL", "CKMB", "CKMBINDEX", "TROPONINI" in the last 168 hours. BNP: Invalid input(s): "POCBNP" CBG: No results for input(s): "GLUCAP" in the last 168 hours. D-Dimer No results for input(s): "DDIMER" in the last 72 hours. Hgb A1c No results for input(s): "HGBA1C" in the last 72 hours. Lipid Profile No results for input(s): "CHOL", "HDL", "LDLCALC", "TRIG", "CHOLHDL", "LDLDIRECT" in the last 72 hours. Thyroid function studies No results for input(s): "TSH", "T4TOTAL", "T3FREE", "THYROIDAB" in the last 72 hours.  Invalid input(s): "FREET3" Anemia work up No results for input(s): "VITAMINB12", "FOLATE", "FERRITIN", "TIBC", "IRON", "RETICCTPCT" in the last 72 hours. Urinalysis    Component Value Date/Time   COLORURINE STRAW (A) 04/11/2024 0125   APPEARANCEUR CLEAR 04/11/2024 0125   LABSPEC >1.046 (H) 04/11/2024 0125   PHURINE 7.0 04/11/2024 0125   GLUCOSEU NEGATIVE 04/11/2024 0125   HGBUR NEGATIVE  04/11/2024 0125   BILIRUBINUR NEGATIVE 04/11/2024 0125   KETONESUR NEGATIVE 04/11/2024 0125   PROTEINUR NEGATIVE 04/11/2024 0125   NITRITE NEGATIVE 04/11/2024 0125   LEUKOCYTESUR NEGATIVE 04/11/2024 0125   Sepsis Labs Recent Labs  Lab 04/10/24 1905 04/12/24 0625 04/16/24 0344  WBC 15.0* 10.5 7.6   Microbiology Recent Results (from the past 240 hours)  Blood Culture (routine x 2)     Status: None   Collection Time: 04/10/24  7:02 PM   Specimen: BLOOD  Result Value Ref Range Status   Specimen Description BLOOD SITE NOT SPECIFIED  Final   Special Requests   Final    BOTTLES DRAWN AEROBIC AND ANAEROBIC Blood Culture adequate volume   Culture   Final    NO GROWTH 5 DAYS Performed at [facility], [address], [place], [place] [address]    Report Status 04/15/2024 FINAL  Final  Blood Culture (routine x 2)     Status:  None   Collection Time: 04/10/24  9:29 PM   Specimen: BLOOD LEFT HAND  Result Value Ref Range Status   Specimen Description BLOOD LEFT HAND  Final   Special Requests   Final    BOTTLES DRAWN AEROBIC ONLY Blood Culture results may not be optimal due to an inadequate volume of blood received in culture bottles   Culture   Final    NO GROWTH 5 DAYS Performed at [facility], [address], [place], [place] [address]    Report Status 04/15/2024 FINAL  Final  MRSA Next Gen by PCR, Nasal     Status: None   Collection Time: 04/11/24  9:52 AM   Specimen: Nasal Mucosa; Nasal Swab  Result Value Ref Range Status   MRSA by PCR Next Gen NOT DETECTED NOT DETECTED Final    Comment: (NOTE) The GeneXpert MRSA Assay (FDA approved for NASAL specimens only), is one component of a comprehensive MRSA colonization surveillance program. It is not intended to diagnose MRSA infection nor to guide or monitor treatment for MRSA infections. Test performance is not FDA approved in patients less than 52 years old. Performed at [facility], [address],  [place], [place] [address]     Procedures/Studies: CT HEAD WO CONTRAST ( ) Result Date: 04/12/2024 CLINICAL DATA:  33 year old male with complicated dural venous sinus thrombosis. Abnormal brain MRI yesterday suggesting cerebral edema. EXAM: CT HEAD WITHOUT CONTRAST TECHNIQUE: Contiguous axial images were obtained from the base of the skull through the vertex without intravenous contrast. RADIATION DOSE REDUCTION: This exam was performed according to the departmental dose-optimization program which includes automated exposure control, adjustment of the mA and/or kV according to patient size and/or use of iterative reconstruction technique. COMPARISON:  Brain MRI yesterday.  Head CT 03/23/2024 and earlier. FINDINGS: Brain: Compared to earliest noncontrast head CT on 01/25/2024 there remains loss of some basilar cisterns. But the brainstem and cerebellar configuration appears improved from the MRI yesterday. The pontine shape appears more normal. Stable diminutive, probably compressed appearance of the ventricular system. No midline shift. No acute intracranial hemorrhage identified. There is maintained gray-white differentiation, with focal hypodensity in the left superior perirolandic subcortical white matter. No new cortically based infarct is identified. Vascular: Abnormal brain vasculature as described on the MRI yesterday. Mixed density in the posterior segment of the superior sagittal sinus again noted. Skull: Intact.  No acute osseous abnormality identified. Sinuses/Orbits: Stable left mastoid opacification and sclerosis over this series of exams. Paranasal Visualized paranasal sinuses and mastoids are stable and well aerated. Other: Visualized orbits and scalp soft tissues are within normal limits. IMPRESSION: 1. Loss of some basilar cisterns when compared to the earliest March CTs, but the brainstem configuration appears improved from the MRI yesterday. 2. Maintained gray-white differentiation, aside from  the known left superior subcortical infarct. No intracranial hemorrhage, midline shift, or ventriculomegaly. Electronically Signed   By: Marlise Simpers M.D.   On: 04/12/2024 04:38   MR BRAIN W WO CONTRAST Addendum Date: 04/11/2024 ADDENDUM REPORT: 04/11/2024 06:36 ADDENDUM: Study discussed by telephone with Dr. Kimberley Penman on 04/11/2024 at 06:35 . Electronically Signed   By: Marlise Simpers M.D.   On: 04/11/2024 06:36   Result Date: 04/11/2024 CLINICAL DATA:  33 year old male with complicated dural venous sinus thrombosis. Discharged from rehab, progressive weakness, decreased p.o. EXAM: MRI HEAD WITHOUT AND WITH CONTRAST TECHNIQUE: Multiplanar, multiecho pulse sequences of the brain and surrounding structures were obtained without and with intravenous contrast. CONTRAST:  6mL GADAVIST  GADOBUTROL  1 MMOL/ML IV SOLN COMPARISON:  Brain MRI 03/24/2024 and earlier. FINDINGS: Brain: Cerebral volume remains normal.  No midline shift. Loss of basilar cistern patency compared to the initial MRI 01/26/2024, with sagging midbrain and tonsillar appearance (series 9, image 12), progressed compared to 03/24/2024. On DWI now there is heterogeneous signal in the superior left perirolandic area, which on T2 and FLAIR appears to be developing encephalomalacia, subcortical white matter gliosis. Additional bilateral indistinct DWI hyperintensity, vaguely throughout the bilateral centrum semiovale (series 7, image 62) which appears restricted on ADC (series 8, image 24) and also vaguely hyperintense on T2 and FLAIR. This also appears increased over this series of exams. Corona radiata, deep gray nuclei, brainstem and cerebellar diffusion appears more normal. Susceptibility weighted images showing extensive intracranial vascular collaterals including on the initial 01/26/2024 exam. Difficult now to exclude superficial siderosis over the cerebral convexities, although may be sequelae of venous thrombosis instead (series 13, image 51). Following  contrast now there remain intermittent superior sagittal sinus venous filling defects primarily along the posterior convexity (such as series 16, image 42). Similar filling defects in the medial aspect of the transverse sinuses series 16, image 19. Enhancement of the abnormal vascular collaterals otherwise not significantly changed from the initial exam. No convincing parenchymal or masslike enhancement. Vascular: Major intracranial vascular flow voids are chronically abnormal, with an appearance of chronic intracranial vascular collaterals, present on the initial MRI 01/26/2024 (without evidence of dural venous sinus thrombosis at that time. No change in the appearance of the arterial flow voids since March. Skull and upper cervical spine: Crowded foramen magnum and cervicomedullary junction. No underlying osseous abnormality identified. Negative visible cervical vertebrae. Sinuses/Orbits: Stable, negative. Other: Increased IAC enhancement is probably related to vascular congestion. Trace mastoid effusions. IMPRESSION: 1. Highly abnormal Brain MRI, which seems to demonstrate Severe Sequelae of dural venous sinus thrombosis (see #2) superimposed on an unknown pre-existing intracranial vascular anomaly; unusual diffusely increased intracranial vascular collaterals on the initial March MRI (with NO evidence of venous thrombosis on that day). 2. Cerebral edema and increasing intracranial mass effect with Downward, Central Herniation of the brain (sagittal image 12 today). DWI suggests widespread cytotoxic edema in both superior hemispheres: Centrum semiovale. And also late subacute to early chronic asymmetric ischemia in the left superior perirolandic area. A progressive intracranial hypotension was also considered but is felt highly unlikely in this clinical scenario. 3. No definite acute intracranial hemorrhage. No midline shift or ventriculomegaly. 4. Intermittent venous sinus thrombus in the dorsal superior  sagittal sinus, medial transverse sinuses similar to CT V and MRI last month. Electronically Signed: By: Marlise Simpers M.D. On: 04/11/2024 06:21   CT VENOGRAM HEAD Result Date: 04/10/2024 CLINICAL DATA:  Initial evaluation for failure to thrive. EXAM: CT VENOGRAM HEAD TECHNIQUE: Venographic phase images of the brain were obtained following the administration of intravenous contrast. Multiplanar reformats and maximum intensity projections were generated. RADIATION DOSE REDUCTION: This exam was performed according to the departmental dose-optimization program which includes automated exposure control, adjustment of the mA and/or kV according to patient size and/or use of iterative reconstruction technique. CONTRAST:  75mL OMNIPAQUE  IOHEXOL  350 MG/ML SOLN COMPARISON:  Comparison made with prior MRI from 03/24/2024 as well as multiple previous exams. FINDINGS: Brain: Diffuse loss of cortical sulcation throughout the brain, similar to previous exams. Hypodensity involving the subcortical parafalcine left frontal lobe, consistent with sequelae of subacute venous ischemic changes, similar to prior. No new large vessel territory infarct. No acute intracranial  hemorrhage. No mass lesion or midline shift. Ventricles are somewhat small, but with mild temporal horn dilatation, stable from previous exams. Basilar cisterns are effaced. Cerebellar tonsils are low lying extending up to approximately 11 mm below the foramen magnum, similar to prior. Secondary crowding at the cervicomedullary junction. Vascular: No abnormal hyperdense vessel seen prior to contrast administration. Following contrast administration, there is persistent dural venous sinus thrombosis involving the posterior aspect of the superior sagittal sinus to the torcula. Scattered clot present within the transverse and sigmoid sinuses bilaterally. Straight sinus, vein of Galen, and internal cerebral veins appear largely patent. Secondary diffuse venous congestion  within the brain. Overall, appearance is similar to prior. Skull: Scalp soft tissues demonstrate no acute finding. Calvarium intact. Sinuses/Orbits: Globes orbital soft tissues within normal limits. Paranasal sinuses are largely clear. Trace chronic appearing left mastoid effusion. Right mastoid air cells are clear. Other: None. IMPRESSION: 1. Persistent dural venous sinus thrombosis involving the posterior aspect of the superior sagittal sinus, with involvement of the transverse and sigmoid sinuses bilaterally. Overall, appearance is similar as compared to previous exams. 2. Diffuse loss of cortical sulcation throughout the brain, with effacement of the basilar cisterns and low lying cerebellar tonsils, similar to previous exams. 3. Hypodensity involving the subcortical parafalcine left frontal lobe, consistent with sequelae of prior venous ischemic changes, similar to prior. No visible new ischemic changes identified. No intracranial hemorrhage. Electronically Signed   By: Virgia Griffins M.D.   On: 04/10/2024 21:40   DG Chest Port 1 View Result Date: 04/10/2024 CLINICAL DATA:  Questionable sepsis EXAM: PORTABLE CHEST 1 VIEW COMPARISON:  Chest x-ray 03/09/2024 FINDINGS: The heart size and mediastinal contours are within normal limits. Both lungs are clear. The visualized skeletal structures are unremarkable. There is gaseous distention of the stomach. IMPRESSION: No active disease. Electronically Signed   By: Tyron Gallon M.D.   On: 04/10/2024 19:31   MR BRAIN WO CONTRAST Result Date: 03/24/2024 CLINICAL DATA:  Dural venous sinus thrombosis EXAM: MRI HEAD WITHOUT CONTRAST TECHNIQUE: Multiplanar, multiecho pulse sequences of the brain and surrounding structures were obtained without intravenous contrast. COMPARISON:  CTA head neck 03/23/2024 Brain MRI 03/08/2024 FINDINGS: Brain: No acute infarct. Susceptibility weighted imaging shows diffuse cortical venous congestion. Unchanged region of gliosis within  the superior paramedian left frontal lobe. Cerebellar tonsils extend 7 mm below the foramen magnum. Vascular: Abnormal flow voids of the bilateral sigmoid and transverse sinuses in the superior sagittal sinus. Arterial flow voids are normal. Skull and upper cervical spine: Normal calvarium and skull base. Visualized upper cervical spine and soft tissues are normal. Sinuses/Orbits:No paranasal sinus fluid levels or advanced mucosal thickening. No mastoid or middle ear effusion. Normal orbits. IMPRESSION: 1. No acute infarct. 2. Abnormal flow voids of the bilateral sigmoid and transverse sinuses and the superior sagittal sinus, consistent with known dural venous sinus thrombosis. 3. Unchanged region of gliosis within the superior paramedian left frontal lobe. 4. 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 Electronically Signed   By: Juanetta Nordmann M.D.   On: 03/24/2024 21:11   Overnight EEG with video Result Date: 03/24/2024 Arleene Lack, MD     03/24/2024  8:15 AM Patient Name: Radley Barto MRN: [id number] Epilepsy Attending: Arleene Lack Referring Physician/Provider: Augustin Leber, MD Duration: 03/23/2024 2231 to 03/24/2024 0813 Patient history: 33 y.o. male with right sided motor fluctuations in the setting of severe venous sinus thrombosis. EEG to evaluate for seizure Level  of alertness: Awake, asleep AEDs during EEG study: VPA Technical aspects: This EEG study was done with scalp electrodes positioned according to the 10-20 International system of electrode placement. Electrical activity was reviewed with band pass filter of 1-70Hz , sensitivity of 7 uV/mm, display speed of 55mm/sec with a 60Hz  notched filter applied as appropriate. EEG data were recorded continuously and digitally stored.  Video monitoring was available and reviewed as appropriate. Description: The posterior dominant rhythm consists of 8 Hz activity of moderate voltage (25-35 uV)  seen predominantly in posterior head regions, symmetric and reactive to eye opening and eye closing. Sleep was characterized by vertex waves, sleep spindles (12 to 14 Hz), maximal frontocentral region. EEG initially showed continuous 3 to 6 Hz theta-delta slowing in left hemisphere, maximal left fronto-temporal region which gradually improved and was noted intermittently. Hyperventilation and photic stimulation were not performed.   ABNORMALITY - Continuous slow, left hemisphere, maximal left fronto-temporal region IMPRESSION: This study is suggestive of cortical dysfunction arising from left hemisphere, maximal left fronto-temporal region likely secondary to underlying structural abnormality. No seizures or epileptiform discharges were seen throughout the recording. Priyanka Suzanne Erps   CT ANGIO HEAD NECK W WO CM (CODE STROKE) Result Date: 03/23/2024 CLINICAL DATA:  Acute neurologic deficit. Dural venous sinus thrombosis. EXAM: CT ANGIOGRAPHY HEAD AND NECK WITH AND WITHOUT CONTRAST TECHNIQUE: Multidetector CT imaging of the head and neck was performed using the standard protocol during bolus administration of intravenous contrast. Multiplanar CT image reconstructions and MIPs were obtained to evaluate the vascular anatomy. Carotid stenosis measurements (when applicable) are obtained utilizing NASCET criteria, using the distal internal carotid diameter as the denominator. RADIATION DOSE REDUCTION: This exam was performed according to the departmental dose-optimization program which includes automated exposure control, adjustment of the mA and/or kV according to patient size and/or use of iterative reconstruction technique. CONTRAST:  75mL OMNIPAQUE  IOHEXOL  350 MG/ML SOLN COMPARISON:  03/23/2024, 03/20/2024, 03/08/2024 FINDINGS: CTA NECK FINDINGS Skeleton: No acute abnormality or high grade bony spinal canal stenosis. Other neck: Normal pharynx, larynx and major salivary glands. No cervical lymphadenopathy.  Unremarkable thyroid gland. Upper chest: No pneumothorax or pleural effusion. No nodules or masses. Aortic arch: There is no calcific atherosclerosis of the aortic arch. Conventional 3 vessel aortic branching pattern. RIGHT carotid system: Normal without aneurysm, dissection or stenosis. LEFT carotid system: Normal without aneurysm, dissection or stenosis. Vertebral arteries: Left dominant configuration. There is no dissection, occlusion or flow-limiting stenosis to the skull base (V1-V3 segments). CTA HEAD FINDINGS POSTERIOR CIRCULATION: Right vertebral artery terminates in PICA. Normal left V4 segment. No proximal occlusion of the anterior or inferior cerebellar arteries. Basilar artery is normal. Superior cerebellar arteries are normal. Posterior cerebral arteries are normal. ANTERIOR CIRCULATION: Intracranial internal carotid arteries are normal. Anterior cerebral arteries are normal. Middle cerebral arteries are normal. Venous sinuses: There are multiple dilated cortical veins, particularly at the left middle cranial fossa. Redemonstration of extensive dural venous sinus thrombosis with mixed density clot. There is limited flow in the transverse and sigmoid sinuses. Approximately 50% of the length of the superior sagittal sinus is occluded. 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 Straight sinus, vein of Galen and internal cerebral veins are patent. Anatomic variants: None Review of the MIP images confirms the above findings. IMPRESSION: 1. No emergent large vessel occlusion or high-grade stenosis of the intracranial arteries. 2. Redemonstration of extensive dural venous sinus thrombosis with limited flow in the transverse and sigmoid sinuses. Approximately 50% of the length of the superior sagittal  sinus is occluded. 3. Persistent cavernous sinus thrombosis. Electronically Signed   By: Juanetta Nordmann M.D.   On: 03/23/2024 21:32   CT HEAD CODE STROKE WO CONTRAST Result Date: 03/23/2024 CLINICAL DATA:   Code stroke.  Acute neurologic deficit EXAM: CT HEAD WITHOUT CONTRAST TECHNIQUE: Contiguous axial images were obtained from the base of the skull through the vertex without intravenous contrast. RADIATION DOSE REDUCTION: This exam was performed according to the departmental dose-optimization program which includes automated exposure control, adjustment of the mA and/or kV according to patient size and/or use of iterative reconstruction technique. COMPARISON:  None Available. FINDINGS: Brain: There is no mass, hemorrhage or extra-axial collection. Low lying cerebellar tonsils and crowding of the basal cisterns are unchanged. The brain parenchyma is normal, without evidence of acute or chronic infarction. Vascular: Hyperdensity within the superior sagittal sinus is consistent with known areas of dural venous sinus thrombosis. Skull: The visualized skull base, calvarium and extracranial soft tissues are normal. Sinuses/Orbits: No fluid levels or advanced mucosal thickening of the visualized paranasal sinuses. No mastoid or middle ear effusion. The orbits are normal. ASPECTS [facility] Stroke Program Early CT Score) - Ganglionic level infarction (caudate, lentiform nuclei, internal capsule, insula, M1-M3 cortex): 7 - Supraganglionic infarction (M4-M6 cortex): 3 Total score (0-10 with 10 being normal): 10 IMPRESSION: 1. No acute intracranial hemorrhage. 2. ASPECTS is 10. 3. Hyperdensity within the superior sagittal sinus is consistent with known areas of dural venous sinus thrombosis. 4. Low lying cerebellar tonsils, unchanged. These results were communicated to Dr. Ann Keto at 9:08 pm on 03/23/2024 by text page via the [facility] messaging system. Electronically Signed   By: Juanetta Nordmann M.D.   On: 03/23/2024 21:12   CT HEAD WO CONTRAST ( ) Result Date: 03/20/2024 CLINICAL DATA:  Dural venous sinus thrombosis suspected has dural sinus thrombosis but HA's have spiked- up to 10/10- so cocnerned there s achange in  thrombosis EXAM: CT HEAD WITHOUT CONTRAST TECHNIQUE: Contiguous axial images were obtained from the base of the skull through the vertex without intravenous contrast. RADIATION DOSE REDUCTION: This exam was performed according to the departmental dose-optimization program which includes automated exposure control, adjustment of the mA and/or kV according to patient size and/or use of iterative reconstruction technique. COMPARISON:  None Available. FINDINGS: Brain: Known areas of subacute infarct were better characterized on prior MRI. No evidence of new/interval acute large vascular territory infarct or acute hemorrhage. No evidence of hydrocephalus or mass lesion. Similar low lying tonsils. Partially empty sella. Vascular: Redemonstrated areas of hyperdensity within the dural venous sinuses, compatible with known dural venous sinus thrombosis. Skull: No acute fracture. Sinuses/Orbits: Clear sinuses.  No acute orbital findings. IMPRESSION: 1. No significant change in comparison to May 5 CT head. Known areas of subacute infarct were better characterized on MRI. No evidence of new/interval acute large vascular territory infarct or acute hemorrhage. 2. Redemonstrated areas of hyperdensity within the dural venous sinuses, compatible with known dural venous sinus thrombosis that was better characterized on prior CTV and MRI. 3. Similar low lying cerebellar tonsils. Electronically Signed   By: Stevenson Elbe M.D.   On: 03/20/2024 11:38   CT ABDOMEN PELVIS W WO CONTRAST Result Date: 03/20/2024 CLINICAL DATA:  Constant abdominal pain. Previous dural venous thrombosis. EXAM: CT ABDOMEN AND PELVIS WITHOUT AND WITH CONTRAST TECHNIQUE: Multidetector CT imaging of the abdomen and pelvis was performed following the standard protocol before and following the bolus administration of intravenous contrast. RADIATION DOSE REDUCTION: This exam was performed according  to the departmental dose-optimization program which includes  automated exposure control, adjustment of the mA and/or kV according to patient size and/or use of iterative reconstruction technique. CONTRAST:  75mL OMNIPAQUE  IOHEXOL  350 MG/ML SOLN COMPARISON:  X-ray 03/18/2024. FINDINGS: Lower chest: Mild ground-glass along the dependent lungs with some atelectasis. Trace pleural fluid. Hepatobiliary: Small area of focal fat deposition seen liver adjacent to the falciform ligament. Segment 4. No other space-occupying liver lesion identified at this time. Gallbladder is nondilated. Patent portal vein. Pancreas: Unremarkable. No pancreatic ductal dilatation or surrounding inflammatory changes. Spleen: Normal in size without focal abnormality. Adrenals/Urinary Tract: Adrenal glands are unremarkable. Kidneys are normal, without renal calculi, focal lesion, or hydronephrosis. Bladder has a preserved contour. There is a small area of slight bladder wall thickening anterior in the midline uncertain etiology and significance. Stomach/Bowel: Large bowel has a normal course and caliber. Scattered air-fluid levels identified along a: Particularly the transverse colon and some in the descending colon. Normal appendix in the right lower quadrant posteroinferior to the cecum. Mild debris and fluid in the stomach. Small bowel is nondilated. Vascular/Lymphatic: No significant vascular findings are present. No enlarged abdominal or pelvic lymph nodes. Reproductive: Prostate is unremarkable. Other: No free air or free fluid identified. Motion throughout the examination limiting evaluation. Musculoskeletal: Scattered Schmorl's nodes. Mild endplate osteophytes in the lumbar spine. IMPRESSION: No bowel obstruction, free air or free fluid. There are a few loops of colon with some air-fluid levels, nonspecific. Normal appendix. Small area of mild bladder wall thickening along the anterior aspect of bladder in the midline of uncertain etiology and significance. Correlate with symptoms. Motion  Electronically Signed   By: Adrianna Horde M.D.   On: 03/20/2024 10:15   DG Abd 1 View Result Date: 03/18/2024 CLINICAL DATA:  Nausea EXAM: ABDOMEN - 1 VIEW COMPARISON:  None Available. FINDINGS: Scattered large and small bowel gas is noted. No free air is seen. No obstructive changes are noted. No bony abnormality is seen. IMPRESSION: No acute abnormality noted. Electronically Signed   By: [name]   On: 03/18/2024 21:14     Time coordinating discharge: Over 30 minutes    [name], MD  [facility] 04/16/2024, 3:16 PM

## 2024-04-16 NOTE — Discharge Instructions (Signed)
 Please have INR check obtained by 6/9 if able. The dose of Coumadin  has been decreased due to decreased nutritional intake and INR level being high on admission.

## 2024-04-16 NOTE — TOC Transition Note (Signed)
 Transition of Care [facility]) - Discharge Note   Patient Details  Name: Samuel Warren MRN: [id number] Date of Birth: 1991/05/16  [facility]) CM/SW Contact:  Jonathan Neighbor, RN Phone Number: 04/16/2024, 10:58 AM   Clinical Narrative:     Pt is discharging home with [facility]. CM has verified with Shelvy Dickens the admissions rep for [facility] that they will check his INR on Monday.  Pt is transporting home via [facility]. CM has verified address with mom and brother. Brother aware of timing for [facility]. Bedside RN updated.    Final next level of care: Home w Home Health Services Barriers to Discharge: No Barriers Identified   Patient Goals and CMS Choice   CMS Medicare.gov Compare Post Acute Care list provided to:: Patient Represenative (must comment) Choice offered to / list presented to : Parent      Discharge Placement                       Discharge Plan and Services Additional resources added to the After Visit Summary for     Discharge Planning Services: CM Consult Post Acute Care Choice: Home Health                    HH Arranged: RN, Nurse's Aide [facility] Agency: [facility]) Date [facility] Agency Contacted: 04/15/24   Representative spoke with at [facility] Agency: Shelvy Dickens  Social Drivers of Health (SDOH) Interventions SDOH Screenings   Food Insecurity: Patient Unable To Answer (04/11/2024)  Housing: Patient Unable To Answer (04/11/2024)  Transportation Needs: Patient Unable To Answer (04/11/2024)  Utilities: Patient Unable To Answer (04/11/2024)  Social Connections: Patient Declined (02/08/2024)  Tobacco Use: Low Risk  (04/04/2024)     Readmission Risk Interventions    03/18/2024    2:52 PM 01/29/2024   11:53 AM  Readmission Risk Prevention Plan  Post Dischage Appt  Complete  Medication Screening  Complete  Transportation Screening Complete Complete  HRI or Home Care Consult Complete   Medication Review (RN Care Manager) Complete

## 2024-04-19 ENCOUNTER — Emergency Department (HOSPITAL_COMMUNITY)

## 2024-04-19 ENCOUNTER — Telehealth: Payer: Self-pay

## 2024-04-19 ENCOUNTER — Inpatient Hospital Stay (HOSPITAL_COMMUNITY)
Admission: EM | Admit: 2024-04-19 | Discharge: 2024-05-11 | DRG: 080 | Disposition: E | Attending: Critical Care Medicine | Admitting: Critical Care Medicine

## 2024-04-19 ENCOUNTER — Encounter (HOSPITAL_COMMUNITY): Payer: Self-pay

## 2024-04-19 ENCOUNTER — Other Ambulatory Visit: Payer: Self-pay

## 2024-04-19 DIAGNOSIS — G08 Intracranial and intraspinal phlebitis and thrombophlebitis: Secondary | ICD-10-CM | POA: Diagnosis not present

## 2024-04-19 DIAGNOSIS — R6510 Systemic inflammatory response syndrome (SIRS) of non-infectious origin without acute organ dysfunction: Secondary | ICD-10-CM | POA: Diagnosis present

## 2024-04-19 DIAGNOSIS — Z66 Do not resuscitate: Secondary | ICD-10-CM | POA: Diagnosis present

## 2024-04-19 DIAGNOSIS — J9601 Acute respiratory failure with hypoxia: Secondary | ICD-10-CM | POA: Diagnosis not present

## 2024-04-19 DIAGNOSIS — K219 Gastro-esophageal reflux disease without esophagitis: Secondary | ICD-10-CM | POA: Diagnosis present

## 2024-04-19 DIAGNOSIS — J189 Pneumonia, unspecified organism: Secondary | ICD-10-CM | POA: Diagnosis not present

## 2024-04-19 DIAGNOSIS — G9382 Brain death: Secondary | ICD-10-CM | POA: Diagnosis not present

## 2024-04-19 DIAGNOSIS — G9341 Metabolic encephalopathy: Secondary | ICD-10-CM | POA: Diagnosis present

## 2024-04-19 DIAGNOSIS — Z515 Encounter for palliative care: Secondary | ICD-10-CM

## 2024-04-19 DIAGNOSIS — R739 Hyperglycemia, unspecified: Secondary | ICD-10-CM | POA: Diagnosis present

## 2024-04-19 DIAGNOSIS — R578 Other shock: Secondary | ICD-10-CM | POA: Diagnosis present

## 2024-04-19 DIAGNOSIS — I959 Hypotension, unspecified: Secondary | ICD-10-CM | POA: Diagnosis present

## 2024-04-19 DIAGNOSIS — Z1152 Encounter for screening for COVID-19: Secondary | ICD-10-CM | POA: Diagnosis not present

## 2024-04-19 DIAGNOSIS — Z7401 Bed confinement status: Secondary | ICD-10-CM | POA: Diagnosis not present

## 2024-04-19 DIAGNOSIS — E876 Hypokalemia: Secondary | ICD-10-CM | POA: Diagnosis present

## 2024-04-19 DIAGNOSIS — J9690 Respiratory failure, unspecified, unspecified whether with hypoxia or hypercapnia: Secondary | ICD-10-CM | POA: Diagnosis not present

## 2024-04-19 DIAGNOSIS — Z7189 Other specified counseling: Secondary | ICD-10-CM | POA: Diagnosis not present

## 2024-04-19 DIAGNOSIS — A419 Sepsis, unspecified organism: Principal | ICD-10-CM | POA: Diagnosis present

## 2024-04-19 DIAGNOSIS — G935 Compression of brain: Secondary | ICD-10-CM | POA: Diagnosis present

## 2024-04-19 DIAGNOSIS — G936 Cerebral edema: Principal | ICD-10-CM | POA: Diagnosis present

## 2024-04-19 DIAGNOSIS — R001 Bradycardia, unspecified: Secondary | ICD-10-CM | POA: Diagnosis not present

## 2024-04-19 DIAGNOSIS — J9621 Acute and chronic respiratory failure with hypoxia: Secondary | ICD-10-CM | POA: Diagnosis not present

## 2024-04-19 DIAGNOSIS — H905 Unspecified sensorineural hearing loss: Secondary | ICD-10-CM | POA: Diagnosis present

## 2024-04-19 DIAGNOSIS — R5383 Other fatigue: Secondary | ICD-10-CM | POA: Diagnosis not present

## 2024-04-19 DIAGNOSIS — Z7901 Long term (current) use of anticoagulants: Secondary | ICD-10-CM

## 2024-04-19 DIAGNOSIS — R4020 Unspecified coma: Secondary | ICD-10-CM | POA: Diagnosis not present

## 2024-04-19 DIAGNOSIS — G09 Sequelae of inflammatory diseases of central nervous system: Secondary | ICD-10-CM | POA: Diagnosis present

## 2024-04-19 DIAGNOSIS — Z79899 Other long term (current) drug therapy: Secondary | ICD-10-CM | POA: Diagnosis not present

## 2024-04-19 DIAGNOSIS — E870 Hyperosmolality and hypernatremia: Secondary | ICD-10-CM | POA: Diagnosis not present

## 2024-04-19 DIAGNOSIS — Z8673 Personal history of transient ischemic attack (TIA), and cerebral infarction without residual deficits: Secondary | ICD-10-CM | POA: Diagnosis not present

## 2024-04-19 DIAGNOSIS — E785 Hyperlipidemia, unspecified: Secondary | ICD-10-CM | POA: Diagnosis present

## 2024-04-19 DIAGNOSIS — R627 Adult failure to thrive: Secondary | ICD-10-CM | POA: Diagnosis present

## 2024-04-19 LAB — CBC WITH DIFFERENTIAL/PLATELET
Abs Immature Granulocytes: 0.12 10*3/uL — ABNORMAL HIGH (ref 0.00–0.07)
Basophils Absolute: 0.1 10*3/uL (ref 0.0–0.1)
Basophils Relative: 0 %
Eosinophils Absolute: 0 10*3/uL (ref 0.0–0.5)
Eosinophils Relative: 0 %
HCT: 43.6 % (ref 39.0–52.0)
Hemoglobin: 13.6 g/dL (ref 13.0–17.0)
Immature Granulocytes: 1 %
Lymphocytes Relative: 6 %
Lymphs Abs: 1 10*3/uL (ref 0.7–4.0)
MCH: 26.7 pg (ref 26.0–34.0)
MCHC: 31.2 g/dL (ref 30.0–36.0)
MCV: 85.5 fL (ref 80.0–100.0)
Monocytes Absolute: 1 10*3/uL (ref 0.1–1.0)
Monocytes Relative: 6 %
Neutro Abs: 14.2 10*3/uL — ABNORMAL HIGH (ref 1.7–7.7)
Neutrophils Relative %: 87 %
Platelets: 276 10*3/uL (ref 150–400)
RBC: 5.1 MIL/uL (ref 4.22–5.81)
RDW: 13 % (ref 11.5–15.5)
WBC: 16.3 10*3/uL — ABNORMAL HIGH (ref 4.0–10.5)
nRBC: 0 % (ref 0.0–0.2)

## 2024-04-19 LAB — CALR +MPL + E12-E15  (REFLEX)

## 2024-04-19 LAB — I-STAT ARTERIAL BLOOD GAS, ED
Acid-Base Excess: 1 mmol/L (ref 0.0–2.0)
Bicarbonate: 22.5 mmol/L (ref 20.0–28.0)
Calcium, Ion: 1.21 mmol/L (ref 1.15–1.40)
HCT: 40 % (ref 39.0–52.0)
Hemoglobin: 13.6 g/dL (ref 13.0–17.0)
O2 Saturation: 99 %
Patient temperature: 36.9
Potassium: 3.2 mmol/L — ABNORMAL LOW (ref 3.5–5.1)
Sodium: 145 mmol/L (ref 135–145)
TCO2: 23 mmol/L (ref 22–32)
pCO2 arterial: 27.9 mmHg — ABNORMAL LOW (ref 32–48)
pH, Arterial: 7.514 — ABNORMAL HIGH (ref 7.35–7.45)
pO2, Arterial: 105 mmHg (ref 83–108)

## 2024-04-19 LAB — URINALYSIS, W/ REFLEX TO CULTURE (INFECTION SUSPECTED)
Bacteria, UA: NONE SEEN
Bilirubin Urine: NEGATIVE
Glucose, UA: NEGATIVE mg/dL
Ketones, ur: 20 mg/dL — AB
Leukocytes,Ua: NEGATIVE
Nitrite: NEGATIVE
Protein, ur: NEGATIVE mg/dL
Specific Gravity, Urine: 1.017 (ref 1.005–1.030)
pH: 6 (ref 5.0–8.0)

## 2024-04-19 LAB — RESP PANEL BY RT-PCR (RSV, FLU A&B, COVID)  RVPGX2
Influenza A by PCR: NEGATIVE
Influenza B by PCR: NEGATIVE
Resp Syncytial Virus by PCR: NEGATIVE
SARS Coronavirus 2 by RT PCR: NEGATIVE

## 2024-04-19 LAB — COMPREHENSIVE METABOLIC PANEL WITH GFR
ALT: 47 U/L — ABNORMAL HIGH (ref 0–44)
AST: 19 U/L (ref 15–41)
Albumin: 4 g/dL (ref 3.5–5.0)
Alkaline Phosphatase: 60 U/L (ref 38–126)
Anion gap: 13 (ref 5–15)
BUN: 19 mg/dL (ref 6–20)
CO2: 20 mmol/L — ABNORMAL LOW (ref 22–32)
Calcium: 9.2 mg/dL (ref 8.9–10.3)
Chloride: 112 mmol/L — ABNORMAL HIGH (ref 98–111)
Creatinine, Ser: 0.94 mg/dL (ref 0.61–1.24)
GFR, Estimated: >60 mL/min (ref >60)
Glucose, Bld: 150 mg/dL — ABNORMAL HIGH (ref 70–99)
Potassium: 3.4 mmol/L — ABNORMAL LOW (ref 3.5–5.1)
Sodium: 145 mmol/L (ref 135–145)
Total Bilirubin: 0.9 mg/dL (ref 0.0–1.2)
Total Protein: 7.4 g/dL (ref 6.5–8.1)

## 2024-04-19 LAB — JAK2 V617F RFX CALR/MPL/E12-15

## 2024-04-19 LAB — PROTIME-INR
INR: 3.6 — ABNORMAL HIGH (ref 0.8–1.2)
Prothrombin Time: 36.4 s — ABNORMAL HIGH (ref 11.4–15.2)

## 2024-04-19 LAB — I-STAT CG4 LACTIC ACID, ED
Lactic Acid, Venous: 2 mmol/L (ref 0.5–1.9)
Lactic Acid, Venous: 2 mmol/L (ref 0.5–1.9)

## 2024-04-19 LAB — MRSA NEXT GEN BY PCR, NASAL: MRSA by PCR Next Gen: NOT DETECTED

## 2024-04-19 LAB — FACTOR 5 LEIDEN

## 2024-04-19 MED ORDER — ACETAMINOPHEN 650 MG RE SUPP
650.0000 mg | Freq: Once | RECTAL | Status: AC
  Administered 2024-04-19: 650 mg via RECTAL
  Filled 2024-04-19: qty 1

## 2024-04-19 MED ORDER — SODIUM CHLORIDE 0.9 % IV SOLN
2.0000 g | Freq: Once | INTRAVENOUS | Status: AC
  Administered 2024-04-19: 2 g via INTRAVENOUS
  Filled 2024-04-19: qty 12.5

## 2024-04-19 MED ORDER — METRONIDAZOLE 500 MG/100ML IV SOLN
500.0000 mg | Freq: Once | INTRAVENOUS | Status: AC
Start: 2024-04-19 — End: 2024-04-19
  Administered 2024-04-19: 500 mg via INTRAVENOUS
  Filled 2024-04-19: qty 100

## 2024-04-19 MED ORDER — VANCOMYCIN HCL IN DEXTROSE 1-5 GM/200ML-% IV SOLN
1000.0000 mg | Freq: Once | INTRAVENOUS | Status: AC
  Administered 2024-04-19: 1000 mg via INTRAVENOUS
  Filled 2024-04-19: qty 200

## 2024-04-19 MED ORDER — LACTATED RINGERS IV BOLUS (SEPSIS)
1000.0000 mL | Freq: Once | INTRAVENOUS | Status: AC
  Administered 2024-04-19: 1000 mL via INTRAVENOUS

## 2024-04-19 MED ORDER — VANCOMYCIN HCL 750 MG/150ML IV SOLN
750.0000 mg | Freq: Two times a day (BID) | INTRAVENOUS | Status: DC
  Administered 2024-04-20: 750 mg via INTRAVENOUS
  Filled 2024-04-19 (×2): qty 150

## 2024-04-19 MED ORDER — SODIUM CHLORIDE 0.9 % IV SOLN
2.0000 g | Freq: Three times a day (TID) | INTRAVENOUS | Status: DC
  Administered 2024-04-19 – 2024-04-21 (×6): 2 g via INTRAVENOUS
  Filled 2024-04-19 (×6): qty 12.5

## 2024-04-19 NOTE — Progress Notes (Signed)
 Pharmacy Antibiotic Note  Samuel Warren is a 33 y.o. male for which pharmacy has been consulted for cefepime and vancomycin  dosing for pneumonia and sepsis.  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 Patient presenting with weakness, confusion, and lethargy.  SCr 0.94 WBC 16.3; LA 2; T 100.3>98.4; HR 104; RR 21 COVID neg / flu neg  Plan: Cefepime 2g q8hr  Flagyl per MD (x1 in ED) Vancomycin  1000 mg once then 750 mg q12hr (eAUC 431.1) unless change in renal function Monitor WBC, fever, renal function, cultures De-escalate when able Levels at steady state F/u MRSA PCR     Temp (24hrs), Avg:99.4 F (37.4 C), Min:98.4 F (36.9 C), Max:100.3 F (37.9 C)  Recent Labs  Lab 04/16/24 0344 04/19/24 1255 04/19/24 1314 04/19/24 1703  WBC 7.6 16.3*  --   --   CREATININE  --  0.94  --   --   LATICACIDVEN  --   --  2.0* 2.0*    Estimated Creatinine Clearance: 92.7 mL/min (by C-G formula based on SCr of 0.94 mg/dL).    No Known Allergies  Microbiology results: Pending  Thank you for allowing pharmacy to be a part of this patient's care.  Dionicio Fray, PharmD, BCPS 04/19/2024 6:13 PM ED Clinical Pharmacist -  [phone]

## 2024-04-19 NOTE — ED Provider Notes (Addendum)
  Physical Exam  BP (!) 140/99   Pulse (!) 104   Temp 100.3 F (37.9 C) (Rectal)   Resp 18   SpO2 99%   Physical Exam  Procedures  .Critical Care  Performed by: Deatra Face, MD Authorized by: Deatra Face, MD   Critical care provider statement:    Critical care time (minutes):  36   Critical care was necessary to treat or prevent imminent or life-threatening deterioration of the following conditions:  Respiratory failure and CNS failure or compromise   Critical care was time spent personally by me on the following activities:  Development of treatment plan with patient or surrogate, discussions with consultants, evaluation of patient's response to treatment, examination of patient, ordering and review of laboratory studies, ordering and review of radiographic studies, ordering and performing treatments and interventions, pulse oximetry, re-evaluation of patient's condition and review of old charts   ED Course / MDM    Medical Decision Making Amount and/or Complexity of Data Reviewed Labs: ordered. Radiology: ordered.  Risk OTC drugs. Prescription drug management. Decision regarding hospitalization.   Assuming care of patient from Dr. Aldean Amass   Patient in the ED for fevers. Recently admitted and d/c. He was diagnosed with central venous sinus thrombosis in April 2025 status post thrombectomy, course was complicated with viral meningitis and he was noted to have cerebral infarction in the setting of venous sinus thrombosis. Patient was initially discharged with Eliquis  but due to failed treatment, it was switched to Coumadin .   Workup thus far shows pneumonia.  Concerning findings are as following - hypoxia, on O2. New. Important pending results are none  According to Dr. Aldean Amass, plan is to admit. Has received HAP coverage.   5:46 PM I put in a consult for medicine admission.  Family just arrived about 10 minutes ago per nurse. Family speaks month and yard.   Hard to communicate right now. At baseline, he is not active.  However he is more lethargic.  Patient also on new oxygen.  I spoke with the nurse, they indicated that patient will have O2 sats dropped less than 90%, therefore he is on oxygen.  Additionally, EMS had indicated to them that patient at baseline will be able to do sign language or track with his eyes, but now he is not doing that.  I will order an ABG right now. Patient's INR is 3.6.  I do not think a CT angio PE is needed.  I have independently reviewed patient's x-ray, there is clear evidence of right-sided infiltrate.  Lactate is reassuring.  White count of 16.3. Agree with the admission plan.  Admitting doctor and I spoke, they will come and see the patient as well.   Deatra Face, MD 04/19/24 1750

## 2024-04-19 NOTE — Sepsis Progress Note (Signed)
 Code Sepsis protocol being monitored by eLink.

## 2024-04-19 NOTE — Telephone Encounter (Signed)
 Copied from CRM [id number]. Topic: Referral - Status >> Apr 19, 2024 11:19 AM Baldo Levan wrote: Reason for CRM: Velia Gess RN case manager for [facility], received orders for home health nursing. They will not be doing these orders due to the patient being in a incoherent state, and has not been awake for over 24 hours. She stated he needs more of a palliative care, hospice, or end of life care referral. [facility] states they will be able to provide care for this patient. A good call back number for Velia Gess is [phone].

## 2024-04-19 NOTE — H&P (Addendum)
 History and Physical    Patient: Samuel Warren ZOX:[id number] DOB: [birth year] DOA: 04/19/2024 DOS: the patient was seen and examined on 04/19/2024 PCP: Samuel Atta, NP  Patient coming from: Home  Chief Complaint: Weakness, Lethergy Chief Complaint  Patient presents with   Code Sepsis   HPI: Samuel Warren is a 33 y.o. 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 Unable to obtrain ROS from patient due to mental status; family at bedside with language barrier but did obtain minimal information for patients brother over telephone.   Patient recently discharged on 04/16/2024 for encephalitis 2/2 viral meningitis and diffuse cerebral edema with early brain herniation treated with hypertonic saline. During that hospitalization he seemed to slightly improve; but his overall prognosis appeared poor given his bedbound status and was referred to palliative care for consideration of hospice and comfort care in the near future.   IN the ED, temperature 100.3, HR 127, RR 25,, BP 140/99, SpO2 92% on 4L [place]. WBC 16.3, Hgb 13.6, Plt 276. Na 145, K 3.4, Cl 112, CO2 20, BUN 19, Cr 0.94. Glucose 150. AST 19, ALT 47, Tbili 0.9. CT head w/o contrast with unchanged diffuse cerebral edema and downward brain herniation.  Chest x-ray with airspace disease right base suspicious for pneumonia.  Urinalysis unrevealing.  Influenza/RSV/COVID PCR negative.  INR 3.6.  Blood cultures x 2 obtained.  Patient received 1 L LR bolus, started on vancomycin , Flagyl, cefepime.  TRH consulted for admission.   Review of Systems: unable to review all systems due to the  inability of the patient to answer questions. Past Medical History:  Diagnosis Date   Deaf    Past Surgical History:  Procedure Laterality Date   IR ANGIO INTRA EXTRACRAN SEL COM CAROTID INNOMINATE BILAT MOD SED  02/02/2024   IR ANGIO INTRA EXTRACRAN SEL COM CAROTID INNOMINATE BILAT MOD SED  03/09/2024   IR CT HEAD LTD  03/09/2024   IR THROMBECT VENO MECH MOD SED  02/02/2024   IR THROMBECT VENO MECH MOD SED  03/09/2024   IR VENO SAGITTAL SINUS  03/09/2024   IR VENO/JUGULAR LEFT  02/02/2024   RADIOLOGY WITH ANESTHESIA N/A 02/02/2024   Procedure: RADIOLOGY WITH ANESTHESIA;  Surgeon: Luellen Sages, MD;  Location: [facility] OR;  Service: Radiology;  Laterality: N/A;   RADIOLOGY WITH ANESTHESIA N/A 03/09/2024   Procedure: RADIOLOGY WITH ANESTHESIA;  Surgeon: Luellen Sages, MD;  Location: [facility] OR;  Service: Radiology;  Laterality: N/A;   Social History:  reports that he has never smoked. He has never used smokeless tobacco. He reports that he does not currently use alcohol. He reports that he does not currently use drugs.  No Known Allergies  History reviewed. No pertinent family history.  Prior to Admission medications   Medication Sig Start Date End Date Taking? Authorizing Provider  acetaminophen  (TYLENOL ) 325 MG tablet Take 2 tablets (650 mg total) by mouth every 4 (four) hours as needed for mild pain (pain score 1-3) (or temp > 37.5 C (99.5 F)). 03/18/24   [name], [name], MD  amitriptyline  (ELAVIL ) 25 MG tablet Take 2 tablets (50 mg total) by mouth at bedtime. 04/06/24   [name], [name], PA-C  atorvastatin  (LIPITOR ) 80 MG  tablet Take 1 tablet (80 mg total) by mouth daily. 04/06/24 05/07/24  [name], [name], PA-C  divalproex  (DEPAKOTE ) 250 MG DR tablet Take 1 tablet (250 mg total) by mouth every 12 (twelve) hours. 04/06/24   [name], [name], PA-C  lidocaine  (LIDODERM ) 5 % Place 2 patches onto the skin daily. Remove & Discard patch within 12 hours or as directed by MD 04/06/24   [name],  [name], PA-C  methocarbamol  (ROBAXIN ) 750 MG tablet Take 1 tablet (750 mg total) by mouth every 6 (six) hours as needed for muscle spasms. 04/06/24   [name], [name], PA-C  midodrine  (PROAMATINE ) 5 MG tablet Take 1 tablet (5 mg total) by mouth 3 (three) times daily as needed. If blood pressure less than 90/60 04/16/24   [name], MD  Multiple Vitamin (MULTIVITAMIN WITH MINERALS) TABS tablet Take 1 tablet by mouth daily. 04/05/24   [name], [name], PA-C  pantoprazole  (PROTONIX ) 40 MG tablet Take 1 tablet (40 mg total) by mouth daily. 04/06/24   [name], [name], PA-C  polyethylene glycol (MIRALAX  / GLYCOLAX ) 17 g packet Take 17 g by mouth daily as needed. 04/05/24   [name], [name], PA-C  tiZANidine  (ZANAFLEX ) 4 MG tablet Take 1 tablet (4 mg total) by mouth at bedtime. 04/06/24   [name], [name], PA-C  warfarin (COUMADIN ) 2.5 MG tablet Take 1 tablet (2.5 mg total) by mouth daily at 4 PM. 04/16/24   [name], MD    Physical Exam: Vitals:   04/19/24 1330 04/19/24 1345 04/19/24 1530 04/19/24 1753  BP: (!) 135/100  (!) 140/99   Pulse: (!) 127  (!) 104   Resp: (!) 9  18   Temp:  100.3 F (37.9 C)  98.4 F (36.9 C)  TempSrc:  Rectal  Axillary  SpO2: 93%  99%    Physical Exam GEN: 33 yo male, somnolent/lethargic, poorly interactive, chronically/critically ill in appearance, appears older than stated age HEENT: NCAT, PERRL, EOMI, sclera clear, dry mucous membranes PULM: Breath sounds diminished bilateral bases, crackles right base, no wheezes, normal respiratory effort without accessory muscle use, on 4 L nasal cannula with SpO2 99% at rest CV: Tachycardic, regular rate w/o M/G/R GI: abd soft, NTND, + BS MSK: no peripheral edema, no purposeful movement of his extremities Integumentary: No concerning rashes/lesions/wounds noted on exposed skin surfaces  Neuro Exam Mental Status: Somnolent/lethargic, poorly interactive, aphasic Cranial Nerves:PERRL, EOMi, tongue protrusion is  midline to obtain further due to participation/mental status Motor: no pronator drift, LUE 0/5,   LLE 0/5,   RUE 0/5,   RLE /5   R. patellar 0+     R. achilles 0+           L. patellar 0+     L. achilles 0+       normal muscle bulk no significant atrophy, + clonuis RLE Sensory: Unable to assess due to mental status Coordination/Movement: no tremor noted     Assessment and Plan:  Severe Sepsis, POA Pneumonia, suspect bacterial RLL Patient apparently being brought back to the ED via EMS after being found more lethargic, somnolent and poorly interactive.  Patient was febrile 100.3 F, tachycardic, tachypneic with elevated WBC count of 16.3.  Chest x-ray with airspace disease right base suspicious for pneumonia. -- Admit to progressive unit -- Blood cultures x 2: Pending -- Urine Legionella/strep pneumo antigen: Pending -- Sputum culture ordered -- LR 1 L bolus followed by 75 mL/h -- Vancomycin , pharmacy consulted for dosing/monitoring -- Cefepime --  Repeat CBC in the a.m.  Supratherapeutic INR INR 3.6 on admission.  Has been prescribed Coumadin  outpatient for history of central venous thrombosis. -- Hold Coumadin  -- INR in a.m.  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  Has been seen by neurology, infectious disease on multiple occasions recently.  CT head without contrast on admission with unchanged appearance of cerebral edema and brain herniation.  Recently  discharged on 04/16/2024 with recommendation of palliative care to follow outpatient with likely need to consider comfort measures and hospice care in the near future.  Overall prognosis extremely poor. -- Palliative care consulted for goals of care and medical decision making  congenital deafness HLD GERD     Advance Care Planning:   Code Status: Full Code discussed with patient's brother, would like to talk to his family before making a decision to transition to DNR status.  Consults: Palliative care  Family Communication: Family present at bedside, brother via telephone  Severity of Illness: The appropriate patient status for this patient is INPATIENT. Inpatient status is judged to be reasonable and necessary in order to provide the required intensity of service to ensure the patient's safety. The patient's presenting symptoms, physical exam findings, and initial radiographic and laboratory data in the context of their chronic comorbidities is felt to place them at high risk for further clinical deterioration. Furthermore, it is not anticipated that the patient will be medically stable for discharge from the hospital within 2 midnights of admission.   * I certify that at the point of admission it is my clinical judgment that the patient will require inpatient hospital care spanning beyond 2 midnights from the point of admission due to high intensity of service, high risk for further deterioration and high frequency of surveillance required.*  Author: Rema Care [place], DO 04/19/2024 6:05 PM  For on call review [email or web address].

## 2024-04-19 NOTE — ED Triage Notes (Signed)
 PT BIB EMS from home for failure to thrive, nonverbal and deaf at baseline, does track with his eyes, discharged from the hospital 3 days ago, and has been sick and worsening since he has been home.  History of meningitis, encephalitis, blood clots.  93% on 8 L  18 g Left forearm HR 106 Respirations 24 BP 146/99 CBG 141

## 2024-04-19 NOTE — ED Provider Notes (Signed)
 [place] EMERGENCY DEPARTMENT AT [facility] Provider Note   CSN: [id number] Arrival date & time: 04/19/24  1240     History  Chief Complaint  Patient presents with   Code Sepsis    Samuel Warren is a 33 y.o. male.  HPI 33 year old male presents with altered mental state.  History is from the nurse spoke to EMS, but the patient is congenitally deaf and unable to use his arms and legs.  Has reportedly been less responsive since leaving the hospital.  EMS was concern for possible sepsis.  Further history is quite limited.  Home Medications Prior to Admission medications   Medication Sig Start Date End Date Taking? Authorizing Provider  acetaminophen  (TYLENOL ) 325 MG tablet Take 2 tablets (650 mg total) by mouth every 4 (four) hours as needed for mild pain (pain score 1-3) (or temp > 37.5 C (99.5 F)). 03/18/24   [name], [name], MD  amitriptyline  (ELAVIL ) 25 MG tablet Take 2 tablets (50 mg total) by mouth at bedtime. 04/06/24   [name], [name], PA-C  atorvastatin  (LIPITOR ) 80 MG tablet Take 1 tablet (80 mg total) by mouth daily. 04/06/24 05/07/24  [name], [name], PA-C  divalproex  (DEPAKOTE ) 250 MG DR tablet Take 1 tablet (250 mg total) by mouth every 12 (twelve) hours. 04/06/24   [name], [name], PA-C  lidocaine  (LIDODERM ) 5 % Place 2 patches onto the skin daily. Remove & Discard patch within 12 hours or as directed by MD 04/06/24   [name], [name], PA-C  methocarbamol  (ROBAXIN ) 750 MG tablet Take 1 tablet (750 mg total) by mouth every 6 (six) hours as needed for muscle spasms. 04/06/24   [name], [name], PA-C  midodrine  (PROAMATINE ) 5 MG tablet Take 1 tablet (5 mg total) by mouth 3 (three) times daily as needed. If blood pressure less than 90/60 04/16/24   [name], MD  Multiple Vitamin (MULTIVITAMIN WITH MINERALS) TABS tablet Take 1 tablet by mouth daily. 04/05/24   [name], [name], PA-C  pantoprazole  (PROTONIX ) 40 MG tablet Take 1 tablet (40 mg total) by  mouth daily. 04/06/24   [name], [name], PA-C  polyethylene glycol (MIRALAX  / GLYCOLAX ) 17 g packet Take 17 g by mouth daily as needed. 04/05/24   [name], [name], PA-C  tiZANidine  (ZANAFLEX ) 4 MG tablet Take 1 tablet (4 mg total) by mouth at bedtime. 04/06/24   [name], [name], PA-C  warfarin (COUMADIN ) 2.5 MG tablet Take 1 tablet (2.5 mg total) by mouth daily at 4 PM. 04/16/24   [name], MD      Allergies    Patient has no known allergies.    Review of Systems   Review of Systems  Unable to perform ROS: Mental status change    Physical Exam Updated Vital Signs BP (!) 140/99   Pulse (!) 104   Temp 100.3 F (37.9 C) (Rectal)   Resp 18   SpO2 99%  Physical Exam Vitals and nursing note reviewed.  Constitutional:      Appearance: He is well-developed. He is ill-appearing. He is not diaphoretic.  HENT:     Head: Normocephalic and atraumatic.     Mouth/Throat:     Mouth: Mucous membranes are dry.  Cardiovascular:     Rate and Rhythm: Regular rhythm. Tachycardia present.     Heart sounds: Normal heart sounds.  Pulmonary:     Effort: Pulmonary effort is normal.     Breath sounds: Normal breath sounds.  Abdominal:  General: There is no distension.     Palpations: Abdomen is soft.     Tenderness: There is no abdominal tenderness.  Skin:    General: Skin is warm and dry.  Neurological:     Mental Status: He is alert.     Comments: [name] and sometimes tracks but does not really follow commands     ED Results / Procedures / Treatments   Labs (all labs ordered are listed, but only abnormal results are displayed) Labs Reviewed  COMPREHENSIVE METABOLIC PANEL WITH GFR - Abnormal; Notable for the following components:      Result Value   Potassium 3.4 (*)    Chloride 112 (*)    CO2 20 (*)    Glucose, Bld 150 (*)    ALT 47 (*)    All other components within normal limits  CBC WITH DIFFERENTIAL/PLATELET - Abnormal; Notable for the following  components:   WBC 16.3 (*)    Neutro Abs 14.2 (*)    Abs Immature Granulocytes 0.12 (*)    All other components within normal limits  PROTIME-INR - Abnormal; Notable for the following components:   Prothrombin Time 36.4 (*)    INR 3.6 (*)    All other components within normal limits  URINALYSIS, W/ REFLEX TO CULTURE (INFECTION SUSPECTED) - Abnormal; Notable for the following components:   Hgb urine dipstick LARGE (*)    Ketones, ur 20 (*)    All other components within normal limits  I-STAT CG4 LACTIC ACID, ED - Abnormal; Notable for the following components:   Lactic Acid, Venous 2.0 (*)    All other components within normal limits  RESP PANEL BY RT-PCR (RSV, FLU A&B, COVID)  [id number]  CULTURE, BLOOD (ROUTINE X 2)  CULTURE, BLOOD (ROUTINE X 2)  I-STAT CG4 LACTIC ACID, ED    EKG EKG Interpretation Date/Time:  Monday April 19 2024 12:59:45 EDT Ventricular Rate:  120 PR Interval:  140 QRS Duration:  89 QT Interval:  355 QTC Calculation: 502 R Axis:   238  Text Interpretation: Sinus tachycardia Anterolateral infarct, old Prolonged QT interval HR is faster than May 2025 Confirmed by Jerilynn Montenegro [phone]) on 04/19/2024 1:47:29 PM  Radiology CT Head Wo Contrast Result Date: 04/19/2024 CLINICAL DATA:  Mental status change, unknown cause. History of extensive dural venous sinus thrombosis. EXAM: CT HEAD WITHOUT CONTRAST TECHNIQUE: Contiguous axial images were obtained from the base of the skull through the vertex without intravenous contrast. RADIATION DOSE REDUCTION: This exam was performed according to the departmental dose-optimization program which includes automated exposure control, adjustment of the mA and/or kV according to patient size and/or use of iterative reconstruction technique. COMPARISON:  Head CT 04/12/2024 and MRI 04/11/2024 FINDINGS: Brain: There is no evidence of an acute large territory cortically based infarct, intracranial hemorrhage, mass, midline shift, or  extra-axial fluid collection. A small subcortical hypodensity in the posterior parasagittal left frontal lobe is unchanged and compatible with the previously described subacute venous infarct. Diffuse sulcal effacement, basilar cistern effacement, and cerebellar tonsillar herniation are unchanged from the prior CT. The ventricles are unchanged in size and likely partially compressed. There is no hydrocephalus. Vascular: Known extensive dural venous sinus thrombosis with heterogeneous mixed density again noted in the posterior portion of the superior sagittal sinus. Skull: No fracture or suspicious lesion. Sinuses/Orbits: The visualized paranasal sinuses and mastoid air cells are well aerated. Unremarkable orbits. Other: None. IMPRESSION: Unchanged CT appearance of the brain with findings again suggestive of cerebral  edema and downward brain herniation. Electronically Signed   By: Aundra Lee M.D.   On: 04/19/2024 16:01   DG Chest Port 1 View Result Date: 04/19/2024 CLINICAL DATA:  Possible sepsis EXAM: PORTABLE CHEST 1 VIEW COMPARISON:  04/10/2024 FINDINGS: Airspace disease at the right base. Normal cardiomediastinal silhouette. No pleural effusion or pneumothorax. IMPRESSION: Airspace disease at the right base suspicious for pneumonia. Electronically Signed   By: Esmeralda Hedge M.D.   On: 04/19/2024 15:25    Procedures .Critical Care  Performed by: Jerilynn Montenegro, MD Authorized by: Jerilynn Montenegro, MD   Critical care provider statement:    Critical care time (minutes):  30   Critical care time was exclusive of:  Separately billable procedures and treating other patients   Critical care was necessary to treat or prevent imminent or life-threatening deterioration of the following conditions:  Respiratory failure and sepsis   Critical care was time spent personally by me on the following activities:  Development of treatment plan with patient or surrogate, discussions with consultants, evaluation of  patient's response to treatment, examination of patient, ordering and review of laboratory studies, ordering and review of radiographic studies, ordering and performing treatments and interventions, pulse oximetry, re-evaluation of patient's condition and review of old charts     Medications Ordered in ED Medications  lactated ringers  bolus 1,000 mL (0 mLs Intravenous Stopped 04/19/24 1509)  ceFEPIme (MAXIPIME) 2 g in sodium chloride  0.9 % 100 mL IVPB (0 g Intravenous Stopped 04/19/24 1440)  metroNIDAZOLE (FLAGYL) IVPB 500 mg (0 mg Intravenous Stopped 04/19/24 1509)  vancomycin  (VANCOCIN ) IVPB 1000 mg/200 mL premix (1,000 mg Intravenous New Bag/Given 04/19/24 1518)  acetaminophen  (TYLENOL ) suppository 650 mg (650 mg Rectal Given 04/19/24 1412)    ED Course/ Medical Decision Making/ A&P                                 Medical Decision Making Amount and/or Complexity of Data Reviewed Labs: ordered.    Details: Lactate 2.0 Radiology: ordered.    Details: Pneumonia ECG/medicine tests: ordered and independent interpretation performed.    Details: Tachycardia  Risk OTC drugs. Prescription drug management. Decision regarding hospitalization.   Workup shows right-sided pneumonia.  He was given broad IV antibiotics, fluids, and Tylenol .  Patient's airway has been stable but he has been requiring oxygen.  He will need admission after head CT results. Care transferred to Dr. Lewis Red.        Final Clinical Impression(s) / ED Diagnoses Final diagnoses:  Sepsis due to pneumonia [facility])    Rx / DC Orders ED Discharge Orders     None         Jerilynn Montenegro, MD 04/19/24 1621

## 2024-04-20 DIAGNOSIS — Z515 Encounter for palliative care: Secondary | ICD-10-CM | POA: Diagnosis not present

## 2024-04-20 DIAGNOSIS — G935 Compression of brain: Secondary | ICD-10-CM

## 2024-04-20 DIAGNOSIS — A419 Sepsis, unspecified organism: Secondary | ICD-10-CM | POA: Diagnosis not present

## 2024-04-20 DIAGNOSIS — Z7189 Other specified counseling: Secondary | ICD-10-CM

## 2024-04-20 DIAGNOSIS — J189 Pneumonia, unspecified organism: Secondary | ICD-10-CM | POA: Diagnosis not present

## 2024-04-20 LAB — BLOOD CULTURE ID PANEL (REFLEXED) - BCID2

## 2024-04-20 LAB — CBC
HCT: 41.6 % (ref 39.0–52.0)
Hemoglobin: 13.1 g/dL (ref 13.0–17.0)
MCH: 26.5 pg (ref 26.0–34.0)
MCHC: 31.5 g/dL (ref 30.0–36.0)
MCV: 84.2 fL (ref 80.0–100.0)
Platelets: 260 10*3/uL (ref 150–400)
RBC: 4.94 MIL/uL (ref 4.22–5.81)
RDW: 13.2 % (ref 11.5–15.5)
WBC: 11.3 10*3/uL — ABNORMAL HIGH (ref 4.0–10.5)
nRBC: 0 % (ref 0.0–0.2)

## 2024-04-20 LAB — COMPREHENSIVE METABOLIC PANEL WITH GFR
ALT: 44 U/L (ref 0–44)
AST: 20 U/L (ref 15–41)
Albumin: 3.4 g/dL — ABNORMAL LOW (ref 3.5–5.0)
Alkaline Phosphatase: 53 U/L (ref 38–126)
Anion gap: 11 (ref 5–15)
BUN: 17 mg/dL (ref 6–20)
CO2: 21 mmol/L — ABNORMAL LOW (ref 22–32)
Calcium: 8.7 mg/dL — ABNORMAL LOW (ref 8.9–10.3)
Chloride: 114 mmol/L — ABNORMAL HIGH (ref 98–111)
Creatinine, Ser: 0.8 mg/dL (ref 0.61–1.24)
GFR, Estimated: >60 mL/min (ref >60)
Glucose, Bld: 139 mg/dL — ABNORMAL HIGH (ref 70–99)
Potassium: 3.3 mmol/L — ABNORMAL LOW (ref 3.5–5.1)
Sodium: 146 mmol/L — ABNORMAL HIGH (ref 135–145)
Total Bilirubin: 1.1 mg/dL (ref 0.0–1.2)
Total Protein: 6.7 g/dL (ref 6.5–8.1)

## 2024-04-20 LAB — PROTIME-INR
INR: 5 (ref 0.8–1.2)
Prothrombin Time: 46.9 s — ABNORMAL HIGH (ref 11.4–15.2)

## 2024-04-20 LAB — VALPROIC ACID LEVEL: Valproic Acid Lvl: <10 ug/mL — ABNORMAL LOW (ref 50–100)

## 2024-04-20 LAB — PHOSPHORUS: Phosphorus: 2.4 mg/dL — ABNORMAL LOW (ref 2.5–4.6)

## 2024-04-20 LAB — MAGNESIUM: Magnesium: 2 mg/dL (ref 1.7–2.4)

## 2024-04-20 LAB — STREP PNEUMONIAE URINARY ANTIGEN: Strep Pneumo Urinary Antigen: NEGATIVE

## 2024-04-20 LAB — PROCALCITONIN: Procalcitonin: 0.2 ng/mL

## 2024-04-20 MED ORDER — LACTATED RINGERS IV SOLN: INTRAVENOUS | Status: AC

## 2024-04-20 MED ORDER — HYDRALAZINE HCL 20 MG/ML IJ SOLN: 10.0000 mg | Freq: Four times a day (QID) | INTRAMUSCULAR | Status: DC | PRN

## 2024-04-20 MED ORDER — VANCOMYCIN HCL IN DEXTROSE 1-5 GM/200ML-% IV SOLN
1000.0000 mg | Freq: Two times a day (BID) | INTRAVENOUS | Status: DC
  Administered 2024-04-20 – 2024-04-21 (×2): 1000 mg via INTRAVENOUS
  Filled 2024-04-20 (×2): qty 200

## 2024-04-20 MED ORDER — HYDROMORPHONE HCL 1 MG/ML IJ SOLN: 0.5000 mg | INTRAMUSCULAR | Status: DC | PRN

## 2024-04-20 MED ORDER — ACETAMINOPHEN 650 MG RE SUPP
650.0000 mg | Freq: Four times a day (QID) | RECTAL | Status: DC | PRN
  Administered 2024-04-20: 650 mg via RECTAL
  Filled 2024-04-20: qty 1

## 2024-04-20 MED ORDER — VALPROATE SODIUM 100 MG/ML IV SOLN
250.0000 mg | Freq: Two times a day (BID) | INTRAVENOUS | Status: DC
  Administered 2024-04-20 – 2024-04-21 (×3): 250 mg via INTRAVENOUS
  Filled 2024-04-20 (×5): qty 2.5

## 2024-04-20 MED ORDER — LACTATED RINGERS IV BOLUS
1000.0000 mL | Freq: Once | INTRAVENOUS | Status: AC
  Administered 2024-04-20: 1000 mL via INTRAVENOUS

## 2024-04-20 MED ORDER — ONDANSETRON HCL 4 MG/2ML IJ SOLN: 4.0000 mg | Freq: Four times a day (QID) | INTRAMUSCULAR | Status: DC | PRN

## 2024-04-20 MED ORDER — ONDANSETRON HCL 4 MG PO TABS
4.0000 mg | ORAL_TABLET | Freq: Four times a day (QID) | ORAL | Status: DC | PRN
Start: 2024-04-20 — End: 2024-04-22

## 2024-04-20 MED ORDER — ACETAMINOPHEN 325 MG PO TABS: 650.0000 mg | ORAL_TABLET | Freq: Four times a day (QID) | ORAL | Status: DC | PRN

## 2024-04-20 MED ORDER — LEVALBUTEROL HCL 0.63 MG/3ML IN NEBU: 0.6300 mg | INHALATION_SOLUTION | Freq: Four times a day (QID) | RESPIRATORY_TRACT | Status: DC | PRN

## 2024-04-20 MED ORDER — VITAMIN K1 10 MG/ML IJ SOLN
5.0000 mg | Freq: Once | INTRAVENOUS | Status: AC
  Administered 2024-04-20: 5 mg via INTRAVENOUS
  Filled 2024-04-20: qty 0.5

## 2024-04-20 NOTE — ED Notes (Signed)
 Pt soiled with urine and was changed with this Tech, Daria Eddy NT and [employer]

## 2024-04-20 NOTE — Plan of Care (Signed)

## 2024-04-20 NOTE — Progress Notes (Signed)
 PHARMACY - PHYSICIAN COMMUNICATION CRITICAL VALUE ALERT - BLOOD CULTURE IDENTIFICATION (BCID)  Samuel Warren is an 33 y.o. male who presented to [facility] on 04/19/2024 with a chief complaint of weakness  Assessment:  One set of blood cultures positive for staph epi with mec A detected.  Suspect this is contamination.  Name of physician (or Provider) Contacted: Dr. [place]  Current antibiotics: Vancomycin  and cefepime empiric therapy  Changes to prescribed antibiotics recommended: None Patient is on recommended antibiotics - No changes needed  Results for orders placed or performed during the hospital encounter of 04/19/24  Blood Culture ID Panel (Reflexed) (Collected: 04/19/2024 12:53 PM)  Result Value Ref Range   Enterococcus faecalis NOT DETECTED NOT DETECTED   Enterococcus Faecium NOT DETECTED NOT DETECTED   Listeria monocytogenes NOT DETECTED NOT DETECTED   Staphylococcus species DETECTED (A) NOT DETECTED   Staphylococcus aureus (BCID) NOT DETECTED NOT DETECTED   Staphylococcus epidermidis DETECTED (A) NOT DETECTED   Staphylococcus lugdunensis NOT DETECTED NOT DETECTED   Streptococcus species NOT DETECTED NOT DETECTED   Streptococcus agalactiae NOT DETECTED NOT DETECTED   Streptococcus pneumoniae NOT DETECTED NOT DETECTED   Streptococcus pyogenes NOT DETECTED NOT DETECTED   A.calcoaceticus-baumannii NOT DETECTED NOT DETECTED   Bacteroides fragilis NOT DETECTED NOT DETECTED   Enterobacterales NOT DETECTED NOT DETECTED   Enterobacter cloacae complex NOT DETECTED NOT DETECTED   Escherichia coli NOT DETECTED NOT DETECTED   Klebsiella aerogenes NOT DETECTED NOT DETECTED   Klebsiella oxytoca NOT DETECTED NOT DETECTED   Klebsiella pneumoniae NOT DETECTED NOT DETECTED   Proteus species NOT DETECTED NOT DETECTED   Salmonella species NOT DETECTED NOT DETECTED   Serratia marcescens NOT DETECTED NOT DETECTED   Haemophilus influenzae NOT DETECTED NOT DETECTED   Neisseria meningitidis NOT  DETECTED NOT DETECTED   Pseudomonas aeruginosa NOT DETECTED NOT DETECTED   Stenotrophomonas maltophilia NOT DETECTED NOT DETECTED   Candida albicans NOT DETECTED NOT DETECTED   Candida auris NOT DETECTED NOT DETECTED   Candida glabrata NOT DETECTED NOT DETECTED   Candida krusei NOT DETECTED NOT DETECTED   Candida parapsilosis NOT DETECTED NOT DETECTED   Candida tropicalis NOT DETECTED NOT DETECTED   Cryptococcus neoformans/gattii NOT DETECTED NOT DETECTED   Methicillin resistance mecA/C DETECTED (A) NOT DETECTED    Sherre Docker 04/20/2024  12:02 PM

## 2024-04-20 NOTE — Consult Note (Signed)
 Consultation Note Date: 04/20/2024   Patient Name: Samuel Warren  DOB: [birth year]  MRN: [id number]  Age / Sex: 33 y.o., male  PCP: [place], NP Referring Physician: [place], Eric J, DO  Reason for Consultation: Establishing goals of care  HPI/Patient Profile: 33 y.o. male   admitted on 04/19/2024 with    Samuel Warren is a 33 y.o. 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 Unable to obtrain ROS from patient due to mental status; family at bedside with language barrier but did obtain minimal information for patients brother over telephone.    Patient recently discharged on 04/16/2024 for encephalitis 2/2 viral meningitis and diffuse cerebral edema with early brain herniation treated with hypertonic saline. During that hospitalization he seemed to slightly improve; but his overall prognosis appeared poor given his bedbound status and was referred to palliative care for consideration of hospice and comfort care in the near future.     Language line 8:30-5:00 [place] # [id number]                         After hrs/week-ends pager- [phone]    Clinical Assessment and Goals of Care:  This NP Thena Fireman reviewed medical records, received report from team, assessed the patient and then meet at the patient's bedside with his wife/Linh Kpa, mother and cousin/Lathi ( helping with basic interpretation. )  to discuss diagnosis, prognosis, GOC, EOL wishes disposition and options.   Concept of Palliative Care was introduced as specialized  medical care for people and their families living with serious illness.  If focuses on providing relief from the symptoms and stress of a serious illness.  The goal is to improve quality of life for both the patient and the family.  Created space and opportunity for patient  and family to explore thoughts and feelings regarding current medical situation     A  discussion was had today regarding advanced directives.  Concepts specific to code status, artifical feeding and hydration, continued IV antibiotics and rehospitalization was had.  The difference between a aggressive medical intervention path  and a palliative comfort care path for this patient at this time was had.  Values and goals of care important to patient and family were attempted to be elicited.   MOST form    Natural trajectory and expectations at EOL were discussed.  Questions and concerns addressed.  Patient  encouraged to call with questions or concerns.     PMT will continue to support holistically.             NEXT OF KIN    SUMMARY OF RECOMMENDATIONS    Code Status/Advance Care Planning: Full code   Symptom Management:  ***  Palliative Prophylaxis:  Aspiration and Frequent Pain Assessment  Additional Recommendations (Limitations, Scope, Preferences): Full Scope Treatment  Psycho-social/Spiritual:  Desire for further Chaplaincy support:{YES NO:22349} Additional Recommendations: {PAL SOCIAL:21064}  Prognosis:  Unable to determine  Discharge Planning: To Be Determined      Primary Diagnoses: Present on Admission:  Sepsis due to pneumonia [facility])   I have reviewed the medical record, interviewed the patient and family, and examined the patient. The following aspects are pertinent.  Past Medical History:  Diagnosis Date   Deaf    Social History   Socioeconomic History   Marital status: Married    Spouse name: Not on file   Number of children: Not on file   Years of education: Not on file    Highest education level: Not on file  Occupational History   Not on file  Tobacco Use   Smoking status: Never   Smokeless tobacco: Never  Substance and Sexual Activity   Alcohol use: Not Currently   Drug use: Not Currently   Sexual activity: Not Currently  Other Topics Concern   Not on file  Social History Narrative   ** Merged History Encounter **       Social Drivers of Health   Financial Resource Strain: Not on file  Food Insecurity: Patient Unable To Answer (04/11/2024)   Hunger Vital Sign    Worried About Running Out of Food in the Last Year: Patient unable to answer    Ran Out of Food in the Last Year: Patient unable to answer  Transportation Needs: Patient Unable To Answer (04/11/2024)   PRAPARE - Transportation    Lack of Transportation (Medical): Patient unable to answer    Lack of Transportation (Non-Medical): Patient unable to answer  Physical Activity: Not on file  Stress: Not on file  Social Connections: Patient Declined (02/08/2024)   Social Connection and Isolation Panel [NHANES]    Frequency of Communication with Friends and Family: Patient declined    Frequency of Social Gatherings with Friends and Family: Patient declined    Attends Religious Services: Patient declined    Database administrator or Organizations: Patient declined    Attends Banker Meetings: Patient declined    Marital Status: Patient declined   History reviewed. No pertinent family history. Scheduled Meds: Continuous Infusions:  ceFEPime (MAXIPIME) IV Stopped (04/20/24 1141)   lactated ringers  75 mL/hr at 04/20/24 0759   valproate sodium  Stopped (04/20/24 1141)   vancomycin  1,000 mg (04/20/24 1444)   PRN Meds:.acetaminophen  **OR** acetaminophen , hydrALAZINE, HYDROmorphone  (DILAUDID ) injection, levalbuterol, ondansetron  **OR** ondansetron  (ZOFRAN ) IV Medications Prior to Admission:  Prior to Admission medications   Medication Sig Start Date End Date Taking? Authorizing  Provider  acetaminophen  (TYLENOL ) 325 MG tablet Take 2 tablets (650 mg total) by mouth every 4 (four) hours as needed for mild pain (pain score 1-3) (or temp > 37.5 C (99.5 F)). 03/18/24  Yes [name], [name], MD  amitriptyline  (ELAVIL ) 25 MG tablet Take 2 tablets (50 mg total) by mouth at bedtime. 04/06/24  Yes [name], [name], PA-C  atorvastatin  (LIPITOR ) 80 MG tablet Take 1 tablet (80 mg total) by mouth daily. 04/06/24 05/07/24 Yes [name], [name], PA-C  butalbital -acetaminophen -caffeine  (FIORICET ) 50-325-40 MG tablet Take 1 tablet by mouth every 6 (six) hours as needed for headache.   Yes [provider]  divalproex  (DEPAKOTE ) 250 MG DR tablet Take 1 tablet (250 mg total) by mouth every 12 (twelve) hours. 04/06/24  Yes [name], [name], PA-C  lidocaine  (LIDODERM ) 5 % Place 2 patches onto the skin daily. Remove & Discard patch within 12 hours or as directed by MD 04/06/24  Yes [name], [name], PA-C  methocarbamol  (ROBAXIN ) 750 MG tablet Take 1 tablet (750 mg total) by mouth every 6 (six) hours as needed for muscle spasms. 04/06/24  Yes [name], [name], PA-C  midodrine  (PROAMATINE ) 5 MG tablet Take 1 tablet (5 mg total) by mouth 3 (three) times daily as needed. If blood pressure less than 90/60 Patient taking differently: Take 5 mg by mouth 3 (three) times daily as needed (IF BLOOD PRESSURE IS LESS THAN 90/60). 04/16/24  Yes [name], MD  Multiple Vitamin (MULTIVITAMIN WITH MINERALS) TABS tablet Take 1 tablet by mouth daily. 04/05/24  Yes [name], [name], PA-C  tiZANidine  (ZANAFLEX ) 4 MG tablet Take 1 tablet (4 mg total) by mouth at bedtime. 04/06/24  Yes [name], [name], PA-C  warfarin (COUMADIN ) 2.5 MG tablet Take 1 tablet (2.5 mg total) by mouth daily at 4 PM. 04/16/24  Yes [name], MD  pantoprazole  (PROTONIX ) 40 MG tablet Take 1 tablet (40 mg total) by mouth daily. 04/06/24   [name], [name], PA-C  polyethylene glycol (MIRALAX  / GLYCOLAX ) 17 g packet Take 17 g by  mouth daily as needed. Patient not taking: Reported on 04/19/2024 04/05/24   [name], [name], PA-C   No Known Allergies Review of Systems  Physical Exam  Vital Signs: BP (!) 129/95   Pulse 95   Temp 99 F (37.2 C) (Rectal)   Resp 16   SpO2 100%  Pain Scale: Not given for pain   Pain Score: Asleep   SpO2: SpO2: 100 % O2 Device:SpO2: 100 % O2 Flow Rate: .   IO: Intake/output summary: No intake or output data in the 24 hours ending 04/20/24 1520  LBM:   Baseline Weight:   Most recent weight:       Palliative Assessment/Data:     Time  Signed by: Thena Fireman, NP   Please contact Palliative Medicine Team phone at [phone] for questions and concerns.  For individual provider: See Tilford Foley

## 2024-04-20 NOTE — Progress Notes (Signed)
 PROGRESS NOTE    Samuel Warren  ZOX:[id number] DOB: [birth year] DOA: 04/19/2024 PCP: [place] At[place], NP    Brief Narrative:   Samuel Warren is a 33 y.o. 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 Unable to obtrain ROS from patient due to mental status; family at bedside with language barrier but did obtain minimal information for patients brother over telephone.    Patient recently discharged on 04/16/2024 for encephalitis 2/2 viral meningitis and diffuse cerebral edema with early brain herniation treated with hypertonic saline. During that hospitalization he seemed to slightly improve; but his overall prognosis appeared poor given his bedbound status and was referred to palliative care for consideration of hospice and comfort care in the near future.    IN the ED, temperature 100.3, HR 127, RR 25,, BP 140/99, SpO2 92% on 4L [place]. WBC 16.3, Hgb 13.6, Plt 276. Na 145, K 3.4, Cl 112, CO2 20, BUN 19, Cr 0.94. Glucose 150. AST 19, ALT 47, Tbili 0.9. CT head w/o contrast with unchanged diffuse cerebral edema and downward brain herniation.  Chest x-ray with airspace disease right base suspicious for pneumonia.  Urinalysis unrevealing.  Influenza/RSV/COVID PCR negative.  INR 3.6.  Blood cultures x 2 obtained.  Patient received 1 L LR bolus, started on vancomycin , Flagyl, cefepime.  TRH consulted for admission.  Assessment & Plan:   Severe Sepsis, POA Pneumonia, suspect bacterial RLL Patient apparently being brought back to the ED via EMS after being found more lethargic, somnolent and poorly interactive.  Patient was febrile 100.3  F, tachycardic, tachypneic with elevated WBC count of 16.3.  Chest x-ray with airspace disease right base suspicious for pneumonia. -- WBC 16.3>11.3 -- Blood cultures x 2: Pending -- Urine Legionella/strep pneumo antigen: Pending -- Sputum culture ordered -- LR 1 L bolus followed by 75 mL/h -- Vancomycin , pharmacy consulted for dosing/monitoring -- Cefepime -- Repeat CBC in the a.m.   Supratherapeutic INR INR 3.6 on admission.  Has been prescribed Coumadin  outpatient for history of central venous thrombosis. -- INR 3.6>5.0 -- Vitamin K 5 mg IV x 1 today -- Hold Coumadin  -- INR in a.m.   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  Has been seen by neurology, infectious disease on multiple occasions recently.  CT head without contrast on admission with unchanged appearance of cerebral edema and brain herniation.  Recently discharged on 04/16/2024 with recommendation of palliative care to follow outpatient with likely need to consider comfort measures and hospice care in the near future.  Overall prognosis extremely poor. -- Palliative care consulted for goals of care and medical decision making; overall very poor/grim prognosis, anticipate ending the year of life   congenital deafness HLD GERD   DVT prophylaxis: SCDs Start: 04/20/24 0714    Code Status:  Full Code Family Communication: Family present at bedside this morning  Disposition Plan:  Level of care:  Progressive Status is: Inpatient Remains inpatient appropriate because: IV antibiotics; overall poor prognosis likely needs to transition to comfort measures    Consultants:  Palliative care  Procedures:  None  Antimicrobials:  Vancomycin  6/9>> Cefepime 6/9>>   Subjective: Patient seen examined bedside, lying in bed.  Remains in ED holding area.  Family present at bedside.  Unfortunately orders were not released by ED nursing staff overnight, discussed with RN this morning who will release.  Patient remains poorly responsive, opens eyes to command with some tracking of his eyes otherwise nonresponsive.  Once again long discussion with family that his overall prognosis is poor with likely impending further demise/death given his cerebral edema and brain herniation.  Remains febrile, 100.7 F this morning, white blood cell count improved.  Remains on IV antibiotics.  Unable obtain any further ROS due to his mental status.  Overall prognosis grim/poor.  Objective: Vitals:   04/20/24 0441 04/20/24 0600 04/20/24 0819 04/20/24 0900  BP:  (!) 135/98  (!) 125/96  Pulse: (!) 117 (!) 119  99  Resp: (!) 22 (!) 23  17  Temp: 98.1 F (36.7 C)  (!) 100.7 F (38.2 C)   TempSrc: Oral  Rectal   SpO2: 100% 100%  100%   No intake or output data in the 24 hours ending 04/20/24 1048 There were no vitals filed for this visit.  Examination:  Physical Exam: GEN: 33 yo male, somnolent/lethargic, poorly interactive, chronically/critically ill in appearance, appears older than stated age HEENT: NCAT, PERRL, EOMI, sclera clear, dry mucous membranes PULM: Breath sounds diminished bilateral bases, crackles right base, no wheezes, normal respiratory effort without accessory muscle use, on 4 L nasal cannula with SpO2 100% at rest CV: Tachycardic, regular rate w/o M/G/R GI: abd soft, NTND, + BS MSK: no peripheral edema, no purposeful movement of his extremities Integumentary: No concerning  rashes/lesions/wounds noted on exposed skin surfaces   Neuro Exam Mental Status: Somnolent/lethargic, poorly interactive, aphasic Cranial Nerves:PERRL, able to obtain further due to participation/mental status Motor: no pronator drift, LUE 0/5,   LLE 0/5,   RUE 0/5,   RLE 0/5   R. patellar 0+     R. achilles 0+           L. patellar 0+     L. achilles 0+       normal muscle bulk no significant atrophy, + clonus RLE Sensory: Unable to assess due to mental status Coordination/Movement: no tremor noted     Data Reviewed: I have personally reviewed following labs and imaging studies  CBC: Recent Labs  Lab 04/16/24 0344 04/19/24 1255 04/19/24 1822 04/20/24 0730  WBC 7.6 16.3*  --  11.3*  NEUTROABS  --  14.2*  --   --   HGB 13.6 13.6 13.6 13.1  HCT 43.2 43.6 40.0 41.6  MCV 82.4 85.5  --  84.2  PLT 278 276  --  260   Basic Metabolic Panel: Recent Labs  Lab 04/19/24 1255 04/19/24 1822  NA 145 145  K 3.4* 3.2*  CL 112*  --   CO2 20*  --   GLUCOSE 150*  --   BUN 19  --   CREATININE 0.94  --   CALCIUM  9.2  --    GFR: Estimated Creatinine Clearance: 92.7 mL/min (by C-G formula based on SCr of 0.94 mg/dL). Liver Function Tests: Recent Labs  Lab 04/19/24  1255  AST 19  ALT 47*  ALKPHOS 60  BILITOT 0.9  PROT 7.4  ALBUMIN 4.0   No results for input(s): "LIPASE", "AMYLASE" in the last 168 hours. No results for input(s): "AMMONIA" in the last 168 hours. Coagulation Profile: Recent Labs  Lab 04/15/24 0616 04/16/24 0344 04/19/24 1255 04/20/24 0730  INR 2.4* 2.1* 3.6* 5.0*   Cardiac Enzymes: No results for input(s): "CKTOTAL", "CKMB", "CKMBINDEX", "TROPONINI" in the last 168 hours. BNP (last 3 results) No results for input(s): "PROBNP" in the last 8760 hours. HbA1C: No results for input(s): "HGBA1C" in the last 72 hours. CBG: No results for input(s): "GLUCAP" in the last 168 hours. Lipid Profile: No results for input(s): "CHOL", "HDL", "LDLCALC", "TRIG",  "CHOLHDL", "LDLDIRECT" in the last 72 hours. Thyroid Function Tests: No results for input(s): "TSH", "T4TOTAL", "FREET4", "T3FREE", "THYROIDAB" in the last 72 hours. Anemia Panel: No results for input(s): "VITAMINB12", "FOLATE", "FERRITIN", "TIBC", "IRON", "RETICCTPCT" in the last 72 hours. Sepsis Labs: Recent Labs  Lab 04/19/24 1314 04/19/24 1703  LATICACIDVEN 2.0* 2.0*    Recent Results (from the past 240 hours)  Blood Culture (routine x 2)     Status: None   Collection Time: 04/10/24  7:02 PM   Specimen: BLOOD  Result Value Ref Range Status   Specimen Description BLOOD SITE NOT SPECIFIED  Final   Special Requests   Final    BOTTLES DRAWN AEROBIC AND ANAEROBIC Blood Culture adequate volume   Culture   Final    NO GROWTH 5 DAYS Performed at [facility], [address] [place], [place] [address]    Report Status 04/15/2024 FINAL  Final  Blood Culture (routine x 2)     Status: None   Collection Time: 04/10/24  9:29 PM   Specimen: BLOOD LEFT HAND  Result Value Ref Range Status   Specimen Description BLOOD LEFT HAND  Final   Special Requests   Final    BOTTLES DRAWN AEROBIC ONLY Blood Culture results may not be optimal due to an inadequate volume of blood received in culture bottles   Culture   Final    NO GROWTH 5 DAYS Performed at [facility], [address], [place], [place] [address]    Report Status 04/15/2024 FINAL  Final  MRSA Next Gen by PCR, Nasal     Status: None   Collection Time: 04/11/24  9:52 AM   Specimen: Nasal Mucosa; Nasal Swab  Result Value Ref Range Status   MRSA by PCR Next Gen NOT DETECTED NOT DETECTED Final    Comment: (NOTE) The GeneXpert MRSA Assay (FDA approved for NASAL specimens only), is one component of a comprehensive MRSA colonization surveillance program. It is not intended to diagnose MRSA infection nor to guide or monitor treatment for MRSA infections. Test performance is not FDA approved in patients less than 1  years old. Performed at [facility], [address], [place], [place] [address]   Blood Culture (routine x 2)     Status: None (Preliminary result)   Collection Time: 04/19/24 12:53 PM   Specimen: BLOOD LEFT FOREARM  Result Value Ref Range Status   Specimen Description BLOOD LEFT FOREARM  Final   Special Requests   Final    BOTTLES DRAWN AEROBIC AND ANAEROBIC Blood Culture adequate volume   Culture  Setup Time   Final    GRAM POSITIVE COCCI IN CLUSTERS ANAEROBIC BOTTLE ONLY Organism ID to follow Performed at [facility],  [address], [place], [place] [address]    Culture GRAM POSITIVE COCCI  Final   Report Status PENDING  Incomplete  Resp panel by RT-PCR (RSV, Flu A&B, Covid) Anterior Nasal Swab     Status: None   Collection Time: 04/19/24 12:53 PM   Specimen: Anterior Nasal Swab  Result Value Ref Range Status   SARS Coronavirus 2 by RT PCR NEGATIVE NEGATIVE Final   Influenza A by PCR NEGATIVE NEGATIVE Final   Influenza B by PCR NEGATIVE NEGATIVE Final    Comment: (NOTE) The Xpert Xpress SARS-CoV-2/FLU/RSV plus assay is intended as an aid in the diagnosis of influenza from Nasopharyngeal swab specimens and should not be used as a sole basis for treatment. Nasal washings and aspirates are unacceptable for Xpert Xpress SARS-CoV-2/FLU/RSV testing.  Fact Sheet for Patients: BloggerCourse.com  Fact Sheet for Healthcare Providers: SeriousBroker.it  This test is not yet approved or cleared by the [place]  FDA and has been authorized for detection and/or diagnosis of SARS-CoV-2 by FDA under an Emergency Use Authorization (EUA). This EUA will remain in effect (meaning this test can be used) for the duration of the COVID-19 declaration under Section 564(b)(1) of the Act, 21 U.S.C. section 360bbb-3(b)(1), unless the authorization is terminated or revoked.     Resp Syncytial Virus by PCR NEGATIVE NEGATIVE  Final    Comment: (NOTE) Fact Sheet for Patients: BloggerCourse.com  Fact Sheet for Healthcare Providers: SeriousBroker.it  This test is not yet approved or cleared by the [place]  FDA and has been authorized for detection and/or diagnosis of SARS-CoV-2 by FDA under an Emergency Use Authorization (EUA). This EUA will remain in effect (meaning this test can be used) for the duration of the COVID-19 declaration under Section 564(b)(1) of the Act, 21 U.S.C. section 360bbb-3(b)(1), unless the authorization is terminated or revoked.  Performed at [facility], [address], [place], [place] [address]   Blood Culture (routine x 2)     Status: None (Preliminary result)   Collection Time: 04/19/24  1:00 PM   Specimen: BLOOD RIGHT FOREARM  Result Value Ref Range Status   Specimen Description BLOOD RIGHT FOREARM  Final   Special Requests   Final    BOTTLES DRAWN AEROBIC AND ANAEROBIC Blood Culture adequate volume   Culture   Final    NO GROWTH < 24 HOURS Performed at [facility], [address] [place], [place] [address]    Report Status PENDING  Incomplete  MRSA Next Gen by PCR, Nasal     Status: None   Collection Time: 04/19/24  7:22 PM   Specimen: Nasal Mucosa; Nasal Swab  Result Value Ref Range Status   MRSA by PCR Next Gen NOT DETECTED NOT DETECTED Final    Comment: (NOTE) The GeneXpert MRSA Assay (FDA approved for NASAL specimens only), is one component of a comprehensive MRSA colonization surveillance program. It is not intended to diagnose MRSA infection nor to guide or monitor treatment for MRSA infections. Test performance is not FDA approved in patients less than 80 years old. Performed at [facility], [address], [place], [place] [address]          Radiology Studies: CT Head Wo Contrast Result Date: 04/19/2024 CLINICAL DATA:  Mental status change, unknown cause. History of extensive  dural venous sinus thrombosis. EXAM: CT HEAD WITHOUT CONTRAST TECHNIQUE: Contiguous axial images were obtained from the base of the skull through the vertex without intravenous contrast. RADIATION DOSE  REDUCTION: This exam was performed according to the departmental dose-optimization program which includes automated exposure control, adjustment of the mA and/or kV according to patient size and/or use of iterative reconstruction technique. COMPARISON:  Head CT 04/12/2024 and MRI 04/11/2024 FINDINGS: Brain: There is no evidence of an acute large territory cortically based infarct, intracranial hemorrhage, mass, midline shift, or extra-axial fluid collection. A small subcortical hypodensity in the posterior parasagittal left frontal lobe is unchanged and compatible with the previously described subacute venous infarct. Diffuse sulcal effacement, basilar cistern effacement, and cerebellar tonsillar herniation are unchanged from the prior CT. The ventricles are unchanged in size and likely partially compressed. There is no hydrocephalus. Vascular: Known extensive dural venous sinus thrombosis with heterogeneous mixed density again noted in the posterior portion of the superior sagittal sinus. Skull: No fracture or suspicious lesion. Sinuses/Orbits: The visualized paranasal sinuses and mastoid air cells are well aerated. Unremarkable orbits. Other: None. IMPRESSION: Unchanged CT appearance of the brain with findings again suggestive of cerebral edema and downward brain herniation. Electronically Signed   By: Aundra Lee M.D.   On: 04/19/2024 16:01   DG Chest Port 1 View Result Date: 04/19/2024 CLINICAL DATA:  Possible sepsis EXAM: PORTABLE CHEST 1 VIEW COMPARISON:  04/10/2024 FINDINGS: Airspace disease at the right base. Normal cardiomediastinal silhouette. No pleural effusion or pneumothorax. IMPRESSION: Airspace disease at the right base suspicious for pneumonia. Electronically Signed   By: Esmeralda Hedge M.D.   On:  04/19/2024 15:25        Scheduled Meds: Continuous Infusions:  ceFEPime (MAXIPIME) IV Stopped (04/20/24 0520)   lactated ringers  75 mL/hr at 04/20/24 0759   phytonadione (VITAMIN K) 5 mg in dextrose  5 % 50 mL IVPB     valproate sodium      vancomycin  Stopped (04/20/24 0445)     LOS: 1 day    Time spent: 56 minutes spent on 04/20/2024 caring for this patient face-to-face including chart review, ordering labs/tests, documenting, discussion with nursing staff, consultants, updating family and interview/physical exam    Rema [facility] [place], DO [facility] Available via Epic secure chat 7am-7pm After these hours, please refer to coverage provider listed on [email or web address] 04/20/2024, 10:48 AM

## 2024-04-20 NOTE — Progress Notes (Signed)
 Pharmacy Antibiotic Note  Samuel Warren is a 33 y.o. male for which pharmacy has been consulted for cefepime and vancomycin  dosing for pneumonia and sepsis.  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 Patient presenting with weakness, confusion, and lethargy.  SCr improved to 0.8 WBC 16.3 > 11.2; LA 2; tmax24: 100.7  Plan: Cefepime 2g q8hr  Flagyl per MD (x1 in ED) Adjust vancomycin  to 1000 mg IV every 12 hours (eAUC 493, Scr 0.8, IBW, Vd: 0.72)  Monitor WBC, fever, renal function, cultures De-escalate when able Levels at steady state F/u MRSA PCR     Temp (24hrs), Avg:99.3 F (37.4 C), Min:98.1 F (36.7 C), Max:100.7 F (38.2 C)  Recent Labs  Lab 04/16/24 0344 04/19/24 1255 04/19/24 1314 04/19/24 1703 04/20/24 0730 04/20/24 0915  WBC 7.6 16.3*  --   --  11.3*  --   CREATININE  --  0.94  --   --   --  0.80  LATICACIDVEN  --   --  2.0* 2.0*  --   --     Estimated Creatinine Clearance: 108.9 mL/min (by C-G formula based on SCr of 0.8 mg/dL).    No Known Allergies  Microbiology results: 6/9 Bcx: GPCs 1/4, BCID with staph epi with MecA resistance   Thank you for involving pharmacy in the patient's care.   Barbra Boone, PharmD PGY1 Acute Care Pharmacy Resident  04/20/2024 12:06 PM

## 2024-04-21 ENCOUNTER — Inpatient Hospital Stay (HOSPITAL_COMMUNITY)

## 2024-04-21 ENCOUNTER — Telehealth (HOSPITAL_BASED_OUTPATIENT_CLINIC_OR_DEPARTMENT_OTHER): Payer: Self-pay | Admitting: *Deleted

## 2024-04-21 ENCOUNTER — Encounter: Admitting: Registered Nurse

## 2024-04-21 DIAGNOSIS — H905 Unspecified sensorineural hearing loss: Secondary | ICD-10-CM

## 2024-04-21 DIAGNOSIS — G9382 Brain death: Secondary | ICD-10-CM | POA: Diagnosis not present

## 2024-04-21 DIAGNOSIS — J9621 Acute and chronic respiratory failure with hypoxia: Secondary | ICD-10-CM

## 2024-04-21 DIAGNOSIS — G935 Compression of brain: Secondary | ICD-10-CM

## 2024-04-21 DIAGNOSIS — J189 Pneumonia, unspecified organism: Secondary | ICD-10-CM | POA: Diagnosis not present

## 2024-04-21 DIAGNOSIS — J9601 Acute respiratory failure with hypoxia: Secondary | ICD-10-CM

## 2024-04-21 DIAGNOSIS — G936 Cerebral edema: Secondary | ICD-10-CM | POA: Diagnosis not present

## 2024-04-21 DIAGNOSIS — G08 Intracranial and intraspinal phlebitis and thrombophlebitis: Secondary | ICD-10-CM | POA: Diagnosis not present

## 2024-04-21 DIAGNOSIS — J9690 Respiratory failure, unspecified, unspecified whether with hypoxia or hypercapnia: Secondary | ICD-10-CM

## 2024-04-21 DIAGNOSIS — R578 Other shock: Secondary | ICD-10-CM

## 2024-04-21 DIAGNOSIS — R5383 Other fatigue: Secondary | ICD-10-CM

## 2024-04-21 LAB — COMPREHENSIVE METABOLIC PANEL WITH GFR
ALT: 73 U/L — ABNORMAL HIGH (ref 0–44)
AST: 47 U/L — ABNORMAL HIGH (ref 15–41)
Albumin: 3.4 g/dL — ABNORMAL LOW (ref 3.5–5.0)
Alkaline Phosphatase: 59 U/L (ref 38–126)
Anion gap: 11 (ref 5–15)
BUN: 13 mg/dL (ref 6–20)
CO2: 19 mmol/L — ABNORMAL LOW (ref 22–32)
Calcium: 8.6 mg/dL — ABNORMAL LOW (ref 8.9–10.3)
Chloride: 117 mmol/L — ABNORMAL HIGH (ref 98–111)
Creatinine, Ser: 0.88 mg/dL (ref 0.61–1.24)
GFR, Estimated: >60 mL/min (ref >60)
Glucose, Bld: 269 mg/dL — ABNORMAL HIGH (ref 70–99)
Potassium: 3.7 mmol/L (ref 3.5–5.1)
Sodium: 147 mmol/L — ABNORMAL HIGH (ref 135–145)
Total Bilirubin: 1.2 mg/dL (ref 0.0–1.2)
Total Protein: 6.9 g/dL (ref 6.5–8.1)

## 2024-04-21 LAB — CBC
HCT: 40.5 % (ref 39.0–52.0)
Hemoglobin: 13 g/dL (ref 13.0–17.0)
MCH: 27.1 pg (ref 26.0–34.0)
MCHC: 32.1 g/dL (ref 30.0–36.0)
MCV: 84.4 fL (ref 80.0–100.0)
Platelets: 252 10*3/uL (ref 150–400)
RBC: 4.8 MIL/uL (ref 4.22–5.81)
RDW: 12.9 % (ref 11.5–15.5)
WBC: 12.7 10*3/uL — ABNORMAL HIGH (ref 4.0–10.5)
nRBC: 0 % (ref 0.0–0.2)

## 2024-04-21 LAB — BASIC METABOLIC PANEL WITH GFR
BUN: 16 mg/dL (ref 6–20)
CO2: 20 mmol/L — ABNORMAL LOW (ref 22–32)
Calcium: 9 mg/dL (ref 8.9–10.3)
Chloride: >130 mmol/L (ref 98–111)
Creatinine, Ser: 0.88 mg/dL (ref 0.61–1.24)
GFR, Estimated: >60 mL/min (ref >60)
Glucose, Bld: 228 mg/dL — ABNORMAL HIGH (ref 70–99)
Potassium: 4.1 mmol/L (ref 3.5–5.1)
Sodium: 162 mmol/L (ref 135–145)

## 2024-04-21 LAB — GLUCOSE, CAPILLARY
Glucose-Capillary: 134 mg/dL — ABNORMAL HIGH (ref 70–99)
Glucose-Capillary: 183 mg/dL — ABNORMAL HIGH (ref 70–99)
Glucose-Capillary: 213 mg/dL — ABNORMAL HIGH (ref 70–99)

## 2024-04-21 LAB — POCT I-STAT 7, (LYTES, BLD GAS, ICA,H+H)
Acid-Base Excess: 0 mmol/L (ref 0.0–2.0)
Acid-base deficit: 6 mmol/L — ABNORMAL HIGH (ref 0.0–2.0)
Acid-base deficit: 5 mmol/L — ABNORMAL HIGH (ref 0.0–2.0)
Bicarbonate: 22.5 mmol/L (ref 20.0–28.0)
Bicarbonate: 19.1 mmol/L — ABNORMAL LOW (ref 20.0–28.0)
Bicarbonate: 20.7 mmol/L (ref 20.0–28.0)
Calcium, Ion: 1.2 mmol/L (ref 1.15–1.40)
Calcium, Ion: 1.36 mmol/L (ref 1.15–1.40)
Calcium, Ion: 1.41 mmol/L — ABNORMAL HIGH (ref 1.15–1.40)
HCT: 37 % — ABNORMAL LOW (ref 39.0–52.0)
HCT: 35 % — ABNORMAL LOW (ref 39.0–52.0)
HCT: 35 % — ABNORMAL LOW (ref 39.0–52.0)
Hemoglobin: 12.6 g/dL — ABNORMAL LOW (ref 13.0–17.0)
Hemoglobin: 11.9 g/dL — ABNORMAL LOW (ref 13.0–17.0)
Hemoglobin: 11.9 g/dL — ABNORMAL LOW (ref 13.0–17.0)
O2 Saturation: 100 %
O2 Saturation: 100 %
O2 Saturation: 100 %
Patient temperature: 98.3
Patient temperature: 97.9
Patient temperature: 97.6
Potassium: 3.5 mmol/L (ref 3.5–5.1)
Potassium: 3.8 mmol/L (ref 3.5–5.1)
Potassium: 3.3 mmol/L — ABNORMAL LOW (ref 3.5–5.1)
Sodium: 146 mmol/L — ABNORMAL HIGH (ref 135–145)
Sodium: 167 mmol/L (ref 135–145)
Sodium: 169 mmol/L (ref 135–145)
TCO2: 23 mmol/L (ref 22–32)
TCO2: 20 mmol/L — ABNORMAL LOW (ref 22–32)
TCO2: 22 mmol/L (ref 22–32)
pCO2 arterial: 29.8 mmHg — ABNORMAL LOW (ref 32–48)
pCO2 arterial: 33.4 mmHg (ref 32–48)
pCO2 arterial: 39.6 mmHg (ref 32–48)
pH, Arterial: 7.485 — ABNORMAL HIGH (ref 7.35–7.45)
pH, Arterial: 7.365 (ref 7.35–7.45)
pH, Arterial: 7.324 — ABNORMAL LOW (ref 7.35–7.45)
pO2, Arterial: 554 mmHg — ABNORMAL HIGH (ref 83–108)
pO2, Arterial: 443 mmHg — ABNORMAL HIGH (ref 83–108)
pO2, Arterial: 566 mmHg — ABNORMAL HIGH (ref 83–108)

## 2024-04-21 LAB — SODIUM: Sodium: 158 mmol/L — ABNORMAL HIGH (ref 135–145)

## 2024-04-21 LAB — PROTIME-INR
INR: 1.2 (ref 0.8–1.2)
Prothrombin Time: 15.8 s — ABNORMAL HIGH (ref 11.4–15.2)

## 2024-04-21 MED ORDER — POLYVINYL ALCOHOL 1.4 % OP SOLN: 1.0000 [drp] | Freq: Four times a day (QID) | OPHTHALMIC | Status: DC | PRN

## 2024-04-21 MED ORDER — TECHNETIUM TC 99M EXAMETAZIME IV KIT
21.0000 | PACK | Freq: Once | INTRAVENOUS | Status: AC | PRN
  Administered 2024-04-21: 21 via INTRAVENOUS

## 2024-04-21 MED ORDER — LORAZEPAM 2 MG/ML IJ SOLN: 2.0000 mg | INTRAMUSCULAR | Status: DC | PRN

## 2024-04-21 MED ORDER — NOREPINEPHRINE 4 MG/250ML-% IV SOLN
0.0000 ug/min | INTRAVENOUS | Status: DC
  Administered 2024-04-21: 40 ug/min via INTRAVENOUS
  Administered 2024-04-21: 7 ug/min via INTRAVENOUS
  Filled 2024-04-21 (×2): qty 250

## 2024-04-21 MED ORDER — CHLORHEXIDINE GLUCONATE CLOTH 2 % EX PADS
6.0000 | MEDICATED_PAD | Freq: Every day | CUTANEOUS | Status: DC
  Administered 2024-04-21: 6 via TOPICAL

## 2024-04-21 MED ORDER — POTASSIUM CHLORIDE 10 MEQ/100ML IV SOLN
10.0000 meq | INTRAVENOUS | Status: AC
  Administered 2024-04-21 (×4): 10 meq via INTRAVENOUS
  Filled 2024-04-21 (×3): qty 100

## 2024-04-21 MED ORDER — SODIUM CHLORIDE 3 % IV BOLUS
250.0000 mL | Freq: Once | INTRAVENOUS | Status: AC
  Administered 2024-04-21: 250 mL via INTRAVENOUS
  Filled 2024-04-21: qty 500

## 2024-04-21 MED ORDER — SODIUM CHLORIDE 0.9 % IV SOLN: INTRAVENOUS | Status: DC

## 2024-04-21 MED ORDER — GLYCOPYRROLATE 0.2 MG/ML IJ SOLN
0.2000 mg | INTRAMUSCULAR | Status: DC | PRN
  Administered 2024-04-21: 0.2 mg via INTRAVENOUS
  Filled 2024-04-21: qty 1

## 2024-04-21 MED ORDER — SODIUM CHLORIDE 0.9 % IV SOLN
250.0000 mL | INTRAVENOUS | Status: DC
  Administered 2024-04-21: 250 mL via INTRAVENOUS

## 2024-04-21 MED ORDER — SODIUM CHLORIDE 3 % IV SOLN
INTRAVENOUS | Status: DC
  Filled 2024-04-21 (×3): qty 500

## 2024-04-21 MED ORDER — HALOPERIDOL LACTATE 5 MG/ML IJ SOLN: 2.5000 mg | INTRAMUSCULAR | Status: DC | PRN

## 2024-04-21 MED ORDER — MORPHINE 100MG IN NS 100ML (1MG/ML) PREMIX INFUSION
0.0000 mg/h | INTRAVENOUS | Status: DC
  Administered 2024-04-21: 5 mg/h via INTRAVENOUS
  Filled 2024-04-21: qty 100

## 2024-04-21 MED ORDER — LACTATED RINGERS IV BOLUS
1000.0000 mL | Freq: Once | INTRAVENOUS | Status: AC
  Administered 2024-04-21: 1000 mL via INTRAVENOUS

## 2024-04-21 MED ORDER — NOREPINEPHRINE 4 MG/250ML-% IV SOLN
INTRAVENOUS | Status: AC
  Administered 2024-04-21: 10 ug/min via INTRAVENOUS
  Filled 2024-04-21: qty 250

## 2024-04-21 MED ORDER — PANTOPRAZOLE SODIUM 40 MG IV SOLR
40.0000 mg | Freq: Every day | INTRAVENOUS | Status: DC
  Administered 2024-04-21: 40 mg via INTRAVENOUS
  Filled 2024-04-21: qty 10

## 2024-04-21 MED ORDER — MORPHINE BOLUS VIA INFUSION: 5.0000 mg | INTRAVENOUS | Status: DC | PRN

## 2024-04-21 MED ORDER — GLYCOPYRROLATE 0.2 MG/ML IJ SOLN: 0.2000 mg | INTRAMUSCULAR | Status: DC | PRN

## 2024-04-21 MED ORDER — GLYCOPYRROLATE 1 MG PO TABS: 1.0000 mg | ORAL_TABLET | ORAL | Status: DC | PRN

## 2024-04-22 LAB — CULTURE, BLOOD (ROUTINE X 2): Special Requests: ADEQUATE

## 2024-04-22 LAB — LEGIONELLA PNEUMOPHILA SEROGP 1 UR AG: L. pneumophila Serogp 1 Ur Ag: NEGATIVE

## 2024-04-24 LAB — CULTURE, BLOOD (ROUTINE X 2)
Culture: NO GROWTH
Special Requests: ADEQUATE

## 2024-05-05 ENCOUNTER — Inpatient Hospital Stay: Admitting: Neurology

## 2024-05-11 NOTE — Progress Notes (Signed)
 Pt transported from 5W20 to 2H18 via BVM on 100% 02 with no complications

## 2024-05-11 NOTE — Death Summary Note (Signed)
 DEATH SUMMARY   Patient Details  Name: Chandler Swiderski MRN: [id number] DOB: 1991/03/27  Admission/Discharge Information   Admit Date:  04-24-2024  Date of Death: Date of Death: 2024/04/26  Time of Death: Time of Death: 1736-04-30 (cardiac death 30-Apr-2137)  Length of Stay: 2  Referring Physician: Alto Atta, NP   Reason(s) for Hospitalization  Severe sepsis-  Diagnoses  Preliminary cause of death:  Secondary Diagnoses (including complications and co-morbidities):  Principal Problem:   Sepsis due to pneumonia [facility]) Active Problems:   Brain compression (HCC)   Congenital deafness   Lethargy diffuse cerebral edema with brain compression 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 year old male who presented to [facility] Apr 24, 2024 for AMS, lethargy, FTT. PMHx significant for congenital deafness, central venous sinus thrombosis (diagnosed 02/2024, s/p thrombectomy) with course was complicated by viral meningitis and cerebral infarction in the setting of venous sinus thrombosis. Patient was initially on Eliquis  but due to failed treatment, AC was switched to Coumadin . Patient was discharged to rehab 5/28. Readmitted to [facility] 5/31-6/6 for generalized weakness/lethargy, headache and poor PO intake. MRI Brain showed diffuse cerebral edema with early central herniation syndrome, Neurology was consulted, patient was  started on antibiotics for possible meningitis, LP was not completed due to high INR and early herniation syndrome on MRI. Patient was eventually discharged 6/6.   Patient presented back to [facility] 2024-04-24 with AMS, lethargy, FTT. On arrival, patient had low grade fever to 100.81F, HR 104, BP 140/99, RR 18 with SpO2 99%. Labs were notable for WBC 11.3, Hgb 13.1, Plt 260. INR 5.0. Na 146, K 3.3, CO2 21, BUN/Cr 17/0.80. Phos 2.4, Mg 2.0. LFTs WNL. PCT 0.20. LA 2.0. Pre-intubation ABG 7.51/27/105/22.5. Initial CXR with airspace disease at R base c/f PNA. Initial CT Head unchanged from prior with findings c/w cerebral edema and downward brain herniation. Patient was subsequently admitted to TRH service.   On 04-26-2024 early AM, patient experienced respiratory arrest and was not protecting his airway; he was intubated peri-code by RT and transferred to [place]. No CPR was performed. Repeat CT Head demonstrated worsening cerebral edema since 2024-04-24 with central and downward intracranial mass effect/tonsillar herniation, no ICH, unresolved areas of superior sagittal sinus thrombus.   PCCM consulted in the setting of ICU transfer/intubation.  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 Despite hypernatremia that should have been protective, physical exam progressed to brain death. Apnea testing was deferred per protocol due to lab abnormalities and hemodynamic instability; confirmatory testing was pursued with NM scan, which supported the diagnosis. He was declared brain dead at 17:37. He was withdrawn from mechanical ventilation after family had a final opportunity to see him.    Pertinent Labs and Studies  Significant Diagnostic Studies NM Brain W Vasc Flow Min 4V Result Date: April 26, 2024 CLINICAL DATA:  Anoxic brain injury EXAM: NM BRAIN SCAN WITH FLOW -  4+ VIEW TECHNIQUE:  Radionuclide angiogram and static images of the brain were obtained after intravenous injection of radiopharmaceutical. RADIOPHARMACEUTICALS:  21 millicuries technetium-46m Ceretec COMPARISON:  CT head 05/05/2024 FINDINGS: Absent appreciable intracranial activity on flow images. Subsequent static images after 15 minutes demonstrate hot nose sign on the anterior image with lateral images demonstrating no substantial intracranial activity in the cerebrum, cerebellum, or visualized brainstem. IMPRESSION: 1. Hot nose sign and lack of appreciable perfusion to the cerebrum, posterior fossa, and brainstem. This lack of visible perfusion constitutes supportive evidence for brain death in the context of a protocol-based clinical determination of brain death. Electronically Signed   By: Freida Jes M.D.   On: 04/13/2024 17:37   DG CHEST PORT 1 VIEW Result Date: 04/15/2024 CLINICAL DATA:  [id number]. Encounter for intubation. Ventilator dependent respiratory failure. EXAM: PORTABLE CHEST 1 VIEW COMPARISON:  Portable chest April 19, 2024 at 2:14 p.m. FINDINGS: 3:34 a.m. ETT interval insertion with tip 2.2 cm from the carina. Electrical pads overlie heart and lower mediastinum. The lungs are clear. There is interval clearing of right infrahilar opacity on prior study. The heart is slightly enlarged.  No vascular congestion is seen. The mediastinum is normally outlined.  No new osseous findings. IMPRESSION: 1. ETT tip 2.2 cm from the carina. 2. Interval clearing of right infrahilar opacity. No acute radiographic chest findings. 3. Mild cardiomegaly. Electronically Signed   By: Denman Fischer M.D.   On: 04/14/2024 04:39   CT HEAD WO CONTRAST ( ) Result Date: 05/07/2024 CLINICAL DATA:  33 year old male with worsening neurologic status. History of dural venous sinus thrombosis beginning in March, prolonged hospitalization, evidence of diffuse cerebral edema on CT and MRI 04/11/2024, 04/12/2024. EXAM: CT HEAD WITHOUT  CONTRAST TECHNIQUE: Contiguous axial images were obtained from the base of the skull through the vertex without intravenous contrast. RADIATION DOSE REDUCTION: This exam was performed according to the departmental dose-optimization program which includes automated exposure control, adjustment of the mA and/or kV according to patient size and/or use of iterative reconstruction technique. COMPARISON:  Head CT 04/19/2024 and earlier. FINDINGS: Brain: Progressive from 04/19/2024: Diffuse loss of sulci, compression of the ventricular system to the midline (series 3, image 12), diffuse loss of basilar cisterns, central and downward mass effect on the brain with tonsillar herniation (sagittal image 29 now). Appearance is similar to the MRI on 04/11/2024, and also appears slightly worse than the CT on 04/12/2024 at which time diffuse cerebral edema was suspected. There does remain gray-white differentiation in both hemispheres. There is no significant mass effect. There is no acute intracranial hemorrhage identified. Vascular: Unresolved venous sinus thrombosis related hyperdensity of the posterosuperior sagittal sinus such as on coronal images 19 and 9. No suspicious arterial hyperdensity when compared to prior exams. Skull: Stable and intact. Sinuses/Orbits: Visualized paranasal sinuses and mastoids are stable and well aerated. Other: Visualized orbits and scalp soft tissues are within normal limits. IMPRESSION: Worsening diffuse cerebral edema since 04/19/2024, with central and downward intracranial mass effect and tonsillar herniation. No associated intracranial hemorrhage. Unresolved areas of hyperdense superior sagittal sinus thrombus. This case was discussed discussed by telephone with Dr. Arora beginning at 0402 hours on 04/17/2024. Electronically Signed   By: Marlise Simpers M.D.   On: 05/08/2024 04:26   CT Head Wo Contrast Result Date: 04/19/2024 CLINICAL DATA:  Mental status change, unknown cause. History of extensive  dural venous sinus thrombosis. EXAM: CT HEAD WITHOUT CONTRAST TECHNIQUE: Contiguous axial images were obtained from the base of the skull  through the vertex without intravenous contrast. RADIATION DOSE REDUCTION: This exam was performed according to the departmental dose-optimization program which includes automated exposure control, adjustment of the mA and/or kV according to patient size and/or use of iterative reconstruction technique. COMPARISON:  Head CT 04/12/2024 and MRI 04/11/2024 FINDINGS: Brain: There is no evidence of an acute large territory cortically based infarct, intracranial hemorrhage, mass, midline shift, or extra-axial fluid collection. A small subcortical hypodensity in the posterior parasagittal left frontal lobe is unchanged and compatible with the previously described subacute venous infarct. Diffuse sulcal effacement, basilar cistern effacement, and cerebellar tonsillar herniation are unchanged from the prior CT. The ventricles are unchanged in size and likely partially compressed. There is no hydrocephalus. Vascular: Known extensive dural venous sinus thrombosis with heterogeneous mixed density again noted in the posterior portion of the superior sagittal sinus. Skull: No fracture or suspicious lesion. Sinuses/Orbits: The visualized paranasal sinuses and mastoid air cells are well aerated. Unremarkable orbits. Other: None. IMPRESSION: Unchanged CT appearance of the brain with findings again suggestive of cerebral edema and downward brain herniation. Electronically Signed   By: Aundra Lee M.D.   On: 04/19/2024 16:01   DG Chest Port 1 View Result Date: 04/19/2024 CLINICAL DATA:  Possible sepsis EXAM: PORTABLE CHEST 1 VIEW COMPARISON:  04/10/2024 FINDINGS: Airspace disease at the right base. Normal cardiomediastinal silhouette. No pleural effusion or pneumothorax. IMPRESSION: Airspace disease at the right base suspicious for pneumonia. Electronically Signed   By: Esmeralda Hedge M.D.   On:  04/19/2024 15:25   CT HEAD WO CONTRAST ( ) Result Date: 04/12/2024 CLINICAL DATA:  33 year old male with complicated dural venous sinus thrombosis. Abnormal brain MRI yesterday suggesting cerebral edema. EXAM: CT HEAD WITHOUT CONTRAST TECHNIQUE: Contiguous axial images were obtained from the base of the skull through the vertex without intravenous contrast. RADIATION DOSE REDUCTION: This exam was performed according to the departmental dose-optimization program which includes automated exposure control, adjustment of the mA and/or kV according to patient size and/or use of iterative reconstruction technique. COMPARISON:  Brain MRI yesterday.  Head CT 03/23/2024 and earlier. FINDINGS: Brain: Compared to earliest noncontrast head CT on 01/25/2024 there remains loss of some basilar cisterns. But the brainstem and cerebellar configuration appears improved from the MRI yesterday. The pontine shape appears more normal. Stable diminutive, probably compressed appearance of the ventricular system. No midline shift. No acute intracranial hemorrhage identified. There is maintained gray-white differentiation, with focal hypodensity in the left superior perirolandic subcortical white matter. No new cortically based infarct is identified. Vascular: Abnormal brain vasculature as described on the MRI yesterday. Mixed density in the posterior segment of the superior sagittal sinus again noted. Skull: Intact.  No acute osseous abnormality identified. Sinuses/Orbits: Stable left mastoid opacification and sclerosis over this series of exams. Paranasal Visualized paranasal sinuses and mastoids are stable and well aerated. Other: Visualized orbits and scalp soft tissues are within normal limits. IMPRESSION: 1. Loss of some basilar cisterns when compared to the earliest March CTs, but the brainstem configuration appears improved from the MRI yesterday. 2. Maintained gray-white differentiation, aside from the known left superior  subcortical infarct. No intracranial hemorrhage, midline shift, or ventriculomegaly. Electronically Signed   By: Marlise Simpers M.D.   On: 04/12/2024 04:38   MR BRAIN W WO CONTRAST Addendum Date: 04/11/2024 ADDENDUM REPORT: 04/11/2024 06:36 ADDENDUM: Study discussed by telephone with Dr. Kimberley Penman on 04/11/2024 at 06:35 . Electronically Signed   By: Marlise Simpers M.D.   On: 04/11/2024 06:36  Result Date: 04/11/2024 CLINICAL DATA:  33 year old male with complicated dural venous sinus thrombosis. Discharged from rehab, progressive weakness, decreased p.o. EXAM: MRI HEAD WITHOUT AND WITH CONTRAST TECHNIQUE: Multiplanar, multiecho pulse sequences of the brain and surrounding structures were obtained without and with intravenous contrast. CONTRAST:  6mL GADAVIST  GADOBUTROL  1 MMOL/ML IV SOLN COMPARISON:  Brain MRI 03/24/2024 and earlier. FINDINGS: Brain: Cerebral volume remains normal.  No midline shift. Loss of basilar cistern patency compared to the initial MRI 01/26/2024, with sagging midbrain and tonsillar appearance (series 9, image 12), progressed compared to 03/24/2024. On DWI now there is heterogeneous signal in the superior left perirolandic area, which on T2 and FLAIR appears to be developing encephalomalacia, subcortical white matter gliosis. Additional bilateral indistinct DWI hyperintensity, vaguely throughout the bilateral centrum semiovale (series 7, image 62) which appears restricted on ADC (series 8, image 24) and also vaguely hyperintense on T2 and FLAIR. This also appears increased over this series of exams. Corona radiata, deep gray nuclei, brainstem and cerebellar diffusion appears more normal. Susceptibility weighted images showing extensive intracranial vascular collaterals including on the initial 01/26/2024 exam. Difficult now to exclude superficial siderosis over the cerebral convexities, although may be sequelae of venous thrombosis instead (series 13, image 51). Following contrast now there remain  intermittent superior sagittal sinus venous filling defects primarily along the posterior convexity (such as series 16, image 42). Similar filling defects in the medial aspect of the transverse sinuses series 16, image 19. Enhancement of the abnormal vascular collaterals otherwise not significantly changed from the initial exam. No convincing parenchymal or masslike enhancement. Vascular: Major intracranial vascular flow voids are chronically abnormal, with an appearance of chronic intracranial vascular collaterals, present on the initial MRI 01/26/2024 (without evidence of dural venous sinus thrombosis at that time. No change in the appearance of the arterial flow voids since March. Skull and upper cervical spine: Crowded foramen magnum and cervicomedullary junction. No underlying osseous abnormality identified. Negative visible cervical vertebrae. Sinuses/Orbits: Stable, negative. Other: Increased IAC enhancement is probably related to vascular congestion. Trace mastoid effusions. IMPRESSION: 1. Highly abnormal Brain MRI, which seems to demonstrate Severe Sequelae of dural venous sinus thrombosis (see #2) superimposed on an unknown pre-existing intracranial vascular anomaly; unusual diffusely increased intracranial vascular collaterals on the initial March MRI (with NO evidence of venous thrombosis on that day). 2. Cerebral edema and increasing intracranial mass effect with Downward, Central Herniation of the brain (sagittal image 12 today). DWI suggests widespread cytotoxic edema in both superior hemispheres: Centrum semiovale. And also late subacute to early chronic asymmetric ischemia in the left superior perirolandic area. A progressive intracranial hypotension was also considered but is felt highly unlikely in this clinical scenario. 3. No definite acute intracranial hemorrhage. No midline shift or ventriculomegaly. 4. Intermittent venous sinus thrombus in the dorsal superior sagittal sinus, medial transverse  sinuses similar to CT V and MRI last month. Electronically Signed: By: Marlise Simpers M.D. On: 04/11/2024 06:21   CT VENOGRAM HEAD Result Date: 04/10/2024 CLINICAL DATA:  Initial evaluation for failure to thrive. EXAM: CT VENOGRAM HEAD TECHNIQUE: Venographic phase images of the brain were obtained following the administration of intravenous contrast. Multiplanar reformats and maximum intensity projections were generated. RADIATION DOSE REDUCTION: This exam was performed according to the departmental dose-optimization program which includes automated exposure control, adjustment of the mA and/or kV according to patient size and/or use of iterative reconstruction technique. CONTRAST:  75mL OMNIPAQUE  IOHEXOL  350 MG/ML SOLN COMPARISON:  Comparison made with prior MRI from 03/24/2024  as well as multiple previous exams. FINDINGS: Brain: Diffuse loss of cortical sulcation throughout the brain, similar to previous exams. Hypodensity involving the subcortical parafalcine left frontal lobe, consistent with sequelae of subacute venous ischemic changes, similar to prior. No new large vessel territory infarct. No acute intracranial hemorrhage. No mass lesion or midline shift. Ventricles are somewhat small, but with mild temporal horn dilatation, stable from previous exams. Basilar cisterns are effaced. Cerebellar tonsils are low lying extending up to approximately 11 mm below the foramen magnum, similar to prior. Secondary crowding at the cervicomedullary junction. Vascular: No abnormal hyperdense vessel seen prior to contrast administration. Following contrast administration, there is persistent dural venous sinus thrombosis involving the posterior aspect of the superior sagittal sinus to the torcula. Scattered clot present within the transverse and sigmoid sinuses bilaterally. Straight sinus, vein of Galen, and internal cerebral veins appear largely patent. Secondary diffuse venous congestion within the brain. Overall, appearance  is similar to prior. Skull: Scalp soft tissues demonstrate no acute finding. Calvarium intact. Sinuses/Orbits: Globes orbital soft tissues within normal limits. Paranasal sinuses are largely clear. Trace chronic appearing left mastoid effusion. Right mastoid air cells are clear. Other: None. IMPRESSION: 1. Persistent dural venous sinus thrombosis involving the posterior aspect of the superior sagittal sinus, with involvement of the transverse and sigmoid sinuses bilaterally. Overall, appearance is similar as compared to previous exams. 2. Diffuse loss of cortical sulcation throughout the brain, with effacement of the basilar cisterns and low lying cerebellar tonsils, similar to previous exams. 3. Hypodensity involving the subcortical parafalcine left frontal lobe, consistent with sequelae of prior venous ischemic changes, similar to prior. No visible new ischemic changes identified. No intracranial hemorrhage. Electronically Signed   By: Virgia Griffins M.D.   On: 04/10/2024 21:40   DG Chest Port 1 View Result Date: 04/10/2024 CLINICAL DATA:  Questionable sepsis EXAM: PORTABLE CHEST 1 VIEW COMPARISON:  Chest x-ray 03/09/2024 FINDINGS: The heart size and mediastinal contours are within normal limits. Both lungs are clear. The visualized skeletal structures are unremarkable. There is gaseous distention of the stomach. IMPRESSION: No active disease. Electronically Signed   By: Tyron Gallon M.D.   On: 04/10/2024 19:31   MR BRAIN WO CONTRAST Result Date: 03/24/2024 CLINICAL DATA:  Dural venous sinus thrombosis EXAM: MRI HEAD WITHOUT CONTRAST TECHNIQUE: Multiplanar, multiecho pulse sequences of the brain and surrounding structures were obtained without intravenous contrast. COMPARISON:  CTA head neck 03/23/2024 Brain MRI 03/08/2024 FINDINGS: Brain: No acute infarct. Susceptibility weighted imaging shows diffuse cortical venous congestion. Unchanged region of gliosis within the superior paramedian left frontal  lobe. Cerebellar tonsils extend 7 mm below the foramen magnum. Vascular: Abnormal flow voids of the bilateral sigmoid and transverse sinuses in the superior sagittal sinus. Arterial flow voids are normal. Skull and upper cervical spine: Normal calvarium and skull base. Visualized upper cervical spine and soft tissues are normal. Sinuses/Orbits:No paranasal sinus fluid levels or advanced mucosal thickening. No mastoid or middle ear effusion. Normal orbits. IMPRESSION: 1. No acute infarct. 2. Abnormal flow voids of the bilateral sigmoid and transverse sinuses and the superior sagittal sinus, consistent with known dural venous sinus thrombosis. 3. Unchanged region of gliosis within the superior paramedian left frontal lobe. 4. 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 Electronically Signed   By: Juanetta Nordmann M.D.   On: 03/24/2024 21:11   Overnight EEG with video Result Date: 03/24/2024 Arleene Lack, MD     03/24/2024  8:15  AM Patient Name: Kem Hensen MRN: [id number] Epilepsy Attending: Arleene Lack Referring Physician/Provider: Augustin Leber, MD Duration: 03/23/2024 2231 to 03/24/2024 0813 Patient history: 33 y.o. male with right sided motor fluctuations in the setting of severe venous sinus thrombosis. EEG to evaluate for seizure Level of alertness: Awake, asleep AEDs during EEG study: VPA Technical aspects: This EEG study was done with scalp electrodes positioned according to the 10-20 International system of electrode placement. Electrical activity was reviewed with band pass filter of 1-70Hz , sensitivity of 7 uV/mm, display speed of 62mm/sec with a 60Hz  notched filter applied as appropriate. EEG data were recorded continuously and digitally stored.  Video monitoring was available and reviewed as appropriate. Description: The posterior dominant rhythm consists of 8 Hz activity of moderate voltage (25-35 uV) seen predominantly in posterior head  regions, symmetric and reactive to eye opening and eye closing. Sleep was characterized by vertex waves, sleep spindles (12 to 14 Hz), maximal frontocentral region. EEG initially showed continuous 3 to 6 Hz theta-delta slowing in left hemisphere, maximal left fronto-temporal region which gradually improved and was noted intermittently. Hyperventilation and photic stimulation were not performed.   ABNORMALITY - Continuous slow, left hemisphere, maximal left fronto-temporal region IMPRESSION: This study is suggestive of cortical dysfunction arising from left hemisphere, maximal left fronto-temporal region likely secondary to underlying structural abnormality. No seizures or epileptiform discharges were seen throughout the recording. Priyanka Suzanne Erps   CT ANGIO HEAD NECK W WO CM (CODE STROKE) Result Date: 03/23/2024 CLINICAL DATA:  Acute neurologic deficit. Dural venous sinus thrombosis. EXAM: CT ANGIOGRAPHY HEAD AND NECK WITH AND WITHOUT CONTRAST TECHNIQUE: Multidetector CT imaging of the head and neck was performed using the standard protocol during bolus administration of intravenous contrast. Multiplanar CT image reconstructions and MIPs were obtained to evaluate the vascular anatomy. Carotid stenosis measurements (when applicable) are obtained utilizing NASCET criteria, using the distal internal carotid diameter as the denominator. RADIATION DOSE REDUCTION: This exam was performed according to the departmental dose-optimization program which includes automated exposure control, adjustment of the mA and/or kV according to patient size and/or use of iterative reconstruction technique. CONTRAST:  75mL OMNIPAQUE  IOHEXOL  350 MG/ML SOLN COMPARISON:  03/23/2024, 03/20/2024, 03/08/2024 FINDINGS: CTA NECK FINDINGS Skeleton: No acute abnormality or high grade bony spinal canal stenosis. Other neck: Normal pharynx, larynx and major salivary glands. No cervical lymphadenopathy. Unremarkable thyroid gland. Upper chest: No  pneumothorax or pleural effusion. No nodules or masses. Aortic arch: There is no calcific atherosclerosis of the aortic arch. Conventional 3 vessel aortic branching pattern. RIGHT carotid system: Normal without aneurysm, dissection or stenosis. LEFT carotid system: Normal without aneurysm, dissection or stenosis. Vertebral arteries: Left dominant configuration. There is no dissection, occlusion or flow-limiting stenosis to the skull base (V1-V3 segments). CTA HEAD FINDINGS POSTERIOR CIRCULATION: Right vertebral artery terminates in PICA. Normal left V4 segment. No proximal occlusion of the anterior or inferior cerebellar arteries. Basilar artery is normal. Superior cerebellar arteries are normal. Posterior cerebral arteries are normal. ANTERIOR CIRCULATION: Intracranial internal carotid arteries are normal. Anterior cerebral arteries are normal. Middle cerebral arteries are normal. Venous sinuses: There are multiple dilated cortical veins, particularly at the left middle cranial fossa. Redemonstration of extensive dural venous sinus thrombosis with mixed density clot. There is limited flow in the transverse and sigmoid sinuses. Approximately 50% of the length of the superior sagittal sinus is occluded. 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 Straight sinus, vein of Galen and internal cerebral veins are patent. Anatomic variants: None Review  of the MIP images confirms the above findings. IMPRESSION: 1. No emergent large vessel occlusion or high-grade stenosis of the intracranial arteries. 2. Redemonstration of extensive dural venous sinus thrombosis with limited flow in the transverse and sigmoid sinuses. Approximately 50% of the length of the superior sagittal sinus is occluded. 3. Persistent cavernous sinus thrombosis. Electronically Signed   By: Juanetta Nordmann M.D.   On: 03/23/2024 21:32   CT HEAD CODE STROKE WO CONTRAST Result Date: 03/23/2024 CLINICAL DATA:  Code stroke.  Acute neurologic deficit EXAM:  CT HEAD WITHOUT CONTRAST TECHNIQUE: Contiguous axial images were obtained from the base of the skull through the vertex without intravenous contrast. RADIATION DOSE REDUCTION: This exam was performed according to the departmental dose-optimization program which includes automated exposure control, adjustment of the mA and/or kV according to patient size and/or use of iterative reconstruction technique. COMPARISON:  None Available. FINDINGS: Brain: There is no mass, hemorrhage or extra-axial collection. Low lying cerebellar tonsils and crowding of the basal cisterns are unchanged. The brain parenchyma is normal, without evidence of acute or chronic infarction. Vascular: Hyperdensity within the superior sagittal sinus is consistent with known areas of dural venous sinus thrombosis. Skull: The visualized skull base, calvarium and extracranial soft tissues are normal. Sinuses/Orbits: No fluid levels or advanced mucosal thickening of the visualized paranasal sinuses. No mastoid or middle ear effusion. The orbits are normal. ASPECTS [facility] Stroke Program Early CT Score) - Ganglionic level infarction (caudate, lentiform nuclei, internal capsule, insula, M1-M3 cortex): 7 - Supraganglionic infarction (M4-M6 cortex): 3 Total score (0-10 with 10 being normal): 10 IMPRESSION: 1. No acute intracranial hemorrhage. 2. ASPECTS is 10. 3. Hyperdensity within the superior sagittal sinus is consistent with known areas of dural venous sinus thrombosis. 4. Low lying cerebellar tonsils, unchanged. These results were communicated to Dr. Ann Keto at 9:08 pm on 03/23/2024 by text page via the [facility] messaging system. Electronically Signed   By: Juanetta Nordmann M.D.   On: 03/23/2024 21:12    Microbiology Recent Results (from the past 240 hours)  Blood Culture (routine x 2)     Status: Abnormal   Collection Time: 04/19/24 12:53 PM   Specimen: BLOOD LEFT FOREARM  Result Value Ref Range Status   Specimen Description BLOOD LEFT  FOREARM  Final   Special Requests   Final    BOTTLES DRAWN AEROBIC AND ANAEROBIC Blood Culture adequate volume   Culture  Setup Time   Final    GRAM POSITIVE COCCI IN CLUSTERS ANAEROBIC BOTTLE ONLY CRITICAL RESULT CALLED TO, READ BACK BY AND VERIFIED WITH: PHARMD JEREMY F [id number] FCP    Culture (A)  Final    STAPHYLOCOCCUS EPIDERMIDIS THE SIGNIFICANCE OF ISOLATING THIS ORGANISM FROM A SINGLE SET OF BLOOD CULTURES WHEN MULTIPLE SETS ARE DRAWN IS UNCERTAIN. PLEASE NOTIFY THE MICROBIOLOGY DEPARTMENT WITHIN ONE WEEK IF SPECIATION AND SENSITIVITIES ARE REQUIRED. Performed at [facility], [address] [place], [place] [address]    Report Status 04/22/2024 FINAL  Final  Resp panel by RT-PCR (RSV, Flu A&B, Covid) Anterior Nasal Swab     Status: None   Collection Time: 04/19/24 12:53 PM   Specimen: Anterior Nasal Swab  Result Value Ref Range Status   SARS Coronavirus 2 by RT PCR NEGATIVE NEGATIVE Final   Influenza A by PCR NEGATIVE NEGATIVE Final   Influenza B by PCR NEGATIVE NEGATIVE Final    Comment: (NOTE) The Xpert Xpress SARS-CoV-2/FLU/RSV plus assay is intended as an aid in the diagnosis  of influenza from Nasopharyngeal swab specimens and should not be used as a sole basis for treatment. Nasal washings and aspirates are unacceptable for Xpert Xpress SARS-CoV-2/FLU/RSV testing.  Fact Sheet for Patients: BloggerCourse.com  Fact Sheet for Healthcare Providers: SeriousBroker.it  This test is not yet approved or cleared by the [place]  FDA and has been authorized for detection and/or diagnosis of SARS-CoV-2 by FDA under an Emergency Use Authorization (EUA). This EUA will remain in effect (meaning this test can be used) for the duration of the COVID-19 declaration under Section 564(b)(1) of the Act, 21 U.S.C. section 360bbb-3(b)(1), unless the authorization is terminated or revoked.     Resp Syncytial Virus by PCR  NEGATIVE NEGATIVE Final    Comment: (NOTE) Fact Sheet for Patients: BloggerCourse.com  Fact Sheet for Healthcare Providers: SeriousBroker.it  This test is not yet approved or cleared by the [place]  FDA and has been authorized for detection and/or diagnosis of SARS-CoV-2 by FDA under an Emergency Use Authorization (EUA). This EUA will remain in effect (meaning this test can be used) for the duration of the COVID-19 declaration under Section 564(b)(1) of the Act, 21 U.S.C. section 360bbb-3(b)(1), unless the authorization is terminated or revoked.  Performed at [facility], [address] [place], [place] [address]   Blood Culture ID Panel (Reflexed)     Status: Abnormal   Collection Time: 04/19/24 12:53 PM  Result Value Ref Range Status   Enterococcus faecalis NOT DETECTED NOT DETECTED Final   Enterococcus Faecium NOT DETECTED NOT DETECTED Final   Listeria monocytogenes NOT DETECTED NOT DETECTED Final   Staphylococcus species DETECTED (A) NOT DETECTED Final    Comment: CRITICAL RESULT CALLED TO, READ BACK BY AND VERIFIED WITH: PHARMD JEREMY F [id number] FCP    Staphylococcus aureus (BCID) NOT DETECTED NOT DETECTED Final   Staphylococcus epidermidis DETECTED (A) NOT DETECTED Final    Comment: Methicillin (oxacillin) resistant coagulase negative staphylococcus. Possible blood culture contaminant (unless isolated from more than one blood culture draw or clinical case suggests pathogenicity). No antibiotic treatment is indicated for blood  culture contaminants. CRITICAL RESULT CALLED TO, READ BACK BY AND VERIFIED WITH: PHARMD JEREMY F 1111 [id number] FCP    Staphylococcus lugdunensis NOT DETECTED NOT DETECTED Final   Streptococcus species NOT DETECTED NOT DETECTED Final   Streptococcus agalactiae NOT DETECTED NOT DETECTED Final   Streptococcus pneumoniae NOT DETECTED NOT DETECTED Final   Streptococcus pyogenes NOT DETECTED  NOT DETECTED Final   A.calcoaceticus-baumannii NOT DETECTED NOT DETECTED Final   Bacteroides fragilis NOT DETECTED NOT DETECTED Final   Enterobacterales NOT DETECTED NOT DETECTED Final   Enterobacter cloacae complex NOT DETECTED NOT DETECTED Final   Escherichia coli NOT DETECTED NOT DETECTED Final   Klebsiella aerogenes NOT DETECTED NOT DETECTED Final   Klebsiella oxytoca NOT DETECTED NOT DETECTED Final   Klebsiella pneumoniae NOT DETECTED NOT DETECTED Final   Proteus species NOT DETECTED NOT DETECTED Final   Salmonella species NOT DETECTED NOT DETECTED Final   Serratia marcescens NOT DETECTED NOT DETECTED Final   Haemophilus influenzae NOT DETECTED NOT DETECTED Final   Neisseria meningitidis NOT DETECTED NOT DETECTED Final   Pseudomonas aeruginosa NOT DETECTED NOT DETECTED Final   Stenotrophomonas maltophilia NOT DETECTED NOT DETECTED Final   Candida albicans NOT DETECTED NOT DETECTED Final   Candida auris NOT DETECTED NOT DETECTED Final   Candida glabrata NOT DETECTED NOT DETECTED Final   Candida krusei NOT DETECTED NOT DETECTED Final   Candida parapsilosis NOT DETECTED  NOT DETECTED Final   Candida tropicalis NOT DETECTED NOT DETECTED Final   Cryptococcus neoformans/gattii NOT DETECTED NOT DETECTED Final   Methicillin resistance mecA/C DETECTED (A) NOT DETECTED Final    Comment: CRITICAL RESULT CALLED TO, READ BACK BY AND VERIFIED WITHDelberta Fee [id number] FCP Performed at [facility], [address], [place], [place] [address]   Blood Culture (routine x 2)     Status: None (Preliminary result)   Collection Time: 04/19/24  1:00 PM   Specimen: BLOOD RIGHT FOREARM  Result Value Ref Range Status   Specimen Description BLOOD RIGHT FOREARM  Final   Special Requests   Final    BOTTLES DRAWN AEROBIC AND ANAEROBIC Blood Culture adequate volume   Culture   Final    NO GROWTH 3 DAYS Performed at [facility], [address] [place], [place] [address]    Report  Status PENDING  Incomplete  MRSA Next Gen by PCR, Nasal     Status: None   Collection Time: 04/19/24  7:22 PM   Specimen: Nasal Mucosa; Nasal Swab  Result Value Ref Range Status   MRSA by PCR Next Gen NOT DETECTED NOT DETECTED Final    Comment: (NOTE) The GeneXpert MRSA Assay (FDA approved for NASAL specimens only), is one component of a comprehensive MRSA colonization surveillance program. It is not intended to diagnose MRSA infection nor to guide or monitor treatment for MRSA infections. Test performance is not FDA approved in patients less than 36 years old. Performed at [facility], [address] [place], [place] [address]     Lab Basic Metabolic Panel: Recent Labs  Lab 04/19/24 1255 04/19/24 1822 04/20/24 0915 04/22/2024 0442 05/03/2024 0647 04/30/2024 1145 05/02/2024 1251 04/30/2024 1346 05/03/2024 1759  NA 145   < > 146* 146* 147* 158* 167* 162* 169*  K 3.4*   < > 3.3* 3.5 3.7  --  3.8 4.1 3.3*  CL 112*  --  114*  --  117*  --   --  >130*  --   CO2 20*  --  21*  --  19*  --   --  20*  --   GLUCOSE 150*  --  139*  --  269*  --   --  228*  --   BUN 19  --  17  --  13  --   --  16  --   CREATININE 0.94  --  0.80  --  0.88  --   --  0.88  --   CALCIUM  9.2  --  8.7*  --  8.6*  --   --  9.0  --   MG  --   --  2.0  --   --   --   --   --   --   PHOS  --   --  2.4*  --   --   --   --   --   --    < > = values in this interval not displayed.   Liver Function Tests: Recent Labs  Lab 04/19/24 1255 04/20/24 0915 05/01/2024 0647  AST 19 20 47*  ALT 47* 44 73*  ALKPHOS 60 53 59  BILITOT 0.9 1.1 1.2  PROT 7.4 6.7 6.9  ALBUMIN 4.0 3.4* 3.4*   No results for input(s): LIPASE, AMYLASE in the last 168 hours. No results for input(s): AMMONIA in the last 168 hours. CBC: Recent Labs  Lab  04/16/24 0344 04/19/24 1255 04/19/24 1822 04/20/24 0730 05/07/2024 0442 04/17/2024 0647 04/14/2024 1251 04/12/2024 1759  WBC 7.6 16.3*  --  11.3*  --  12.7*  --   --   NEUTROABS  --   14.2*  --   --   --   --   --   --   HGB 13.6 13.6   < > 13.1 12.6* 13.0 11.9* 11.9*  HCT 43.2 43.6   < > 41.6 37.0* 40.5 35.0* 35.0*  MCV 82.4 85.5  --  84.2  --  84.4  --   --   PLT 278 276  --  260  --  252  --   --    < > = values in this interval not displayed.   Cardiac Enzymes: No results for input(s): CKTOTAL, CKMB, CKMBINDEX, TROPONINI in the last 168 hours. Sepsis Labs: Recent Labs  Lab 04/16/24 0344 04/19/24 1255 04/19/24 1314 04/19/24 1703 04/20/24 0730 04/20/24 0915 05/08/2024 0647  PROCALCITON  --   --   --   --   --  0.20  --   WBC 7.6 16.3*  --   --  11.3*  --  12.7*  LATICACIDVEN  --   --  2.0* 2.0*  --   --   --     Procedures/Operations  intubation   Joesph Mussel 04/22/2024, 4:28 PM

## 2024-05-11 NOTE — Progress Notes (Signed)
 Pt transported to/from VQ scan via vent on 100% fio2 w/ no apparent respiratory complications. Sat 97% currently, no distress noted.

## 2024-05-11 NOTE — Progress Notes (Addendum)
 Pt sats at 87-88%. O2 2LNC applied. Pt remains non-verbal. Pt not opening eyes at this time. Follwos no commands.

## 2024-05-11 NOTE — Progress Notes (Signed)
 Pt transported to CT and back via ventilator w/o complications. RT and RN x2 at bedside.

## 2024-05-11 NOTE — Consult Note (Signed)
 NAME:  Samuel Warren, MRN:  [id number], DOB:  05-25-1991, LOS: 2 ADMISSION DATE:  04/19/2024, CONSULTATION DATE: 04/11/2024 REFERRING MD:  [place] - [facility], CHIEF COMPLAINT: AMS, lethargy, FTT  History of Present Illness:  33 year old man who presented to [facility] 6/9 for AMS, lethargy, FTT. PMHx significant for congenital deafness, central venous sinus thrombosis (diagnosed 02/2024, s/p thrombectomy) with course was complicated by viral meningitis and cerebral infarction in the setting of venous sinus thrombosis. Patient was initially on Eliquis  but due to failed treatment, AC was switched to Coumadin . Patient was discharged to rehab 5/28. Readmitted to [facility] 5/31-6/6 for generalized weakness/lethargy, headache and poor PO intake. MRI Brain showed diffuse cerebral edema with early central herniation syndrome, Neurology was consulted, patient was started on antibiotics for possible meningitis, LP was not completed due to high INR and early herniation syndrome on MRI. Patient was eventually discharged 6/6.  Patient presented back to [facility] 6/9 with AMS, lethargy, FTT. On arrival, patient had low grade fever to 100.65F, HR 104, BP 140/99, RR 18 with SpO2 99%. Labs were notable for WBC 11.3, Hgb 13.1, Plt 260. INR 5.0. Na 146, K 3.3, CO2 21, BUN/Cr 17/0.80. Phos 2.4, Mg 2.0. LFTs WNL. PCT 0.20. LA 2.0. Pre-intubation ABG 7.51/27/105/22.5. Initial CXR with airspace disease at R base c/f PNA. Initial CT Head unchanged from prior with findings c/w cerebral edema and downward brain herniation. Patient was subsequently admitted to [facility] service.  On 6/11 early AM, patient experienced respiratory arrest and was not protecting his airway; he was intubated peri-code by RT and transferred to [facility]. No CPR was performed. 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  PCCM consulted in the  setting of ICU transfer/intubation.  Pertinent Medical History:  Dural venous sinus thrombosis Congenital deafness  Significant Hospital Events: Including procedures, antibiotic start and stop dates in addition to other pertinent events   6/9 - Presented to [facility] with AMS/lethargy/FTT, somnolent x 24H at home. CT Head with cerebral edema/downward brain herniation. Initially admitted to [facility]. 6/10 - PMT consulted given patient's poor prognosis and repeat hospitalizations. 6/11 - Early AM, CODE BLUE called for respiratory arrest, intubated by RT. Repeat CT Head with worsening cerebral edema  with central and downward intracranial mass effect/tonsillar herniation. Loss of brainstem reflexes. Neuro consulted. PCCM consulted for ICU transfer.  Interim History / Subjective:  PCCM consulted for management post-code. Patient well known to our service, recently discharged 6/6. CT Head with significant worsening of cerebral edema with brain compression and now tonsillar herniation, loss of brainstem reflexes Neuro consulted - HTS started Patient likely to progress to brain death  Objective:   Blood pressure (!) 128/93, pulse (!) 102, temperature 98.7 F (37.1 C), temperature source Oral, resp. rate 20, SpO2 98%.    Vent Mode: PRVC FiO2 (%):  [40 %-100 %] 40 % Set Rate:  [20 bmp-22 bmp] 20 bmp Vt Set:  [470 mL] 470 mL PEEP:  [5 cmH20] 5 cmH20 Plateau Pressure:  [14 cmH20] 14 cmH20   Intake/Output Summary (Last 24 hours) at 04/16/2024 0535 Last data filed at 04/20/2024 0500 Gross per 24 hour  Intake 317.41 ml  Output --  Net 317.41 ml    There were no vitals filed for this visit.  Physical Examination: General: Acutely ill-appearing man in NAD. HEENT: [place]/AT, anicteric sclera, pupils fixed and dilated 6mm, moist mucous membranes. Neuro: Comatose. Does not respond  to verbal, tactile or noxious stimuli. Not following commands. No spontaneous movement of extremities noted on exam.  Cough/gag/corneals/dolls eyes absent.  CV: RRR, no m/g/r. PULM: Breathing even and unlabored on vent (PEEP 5, FiO2 40%). Not breathing over ventilator. Lung fields CTAB. GI: Soft, nontender, nondistended. Normoactive bowel sounds. Extremities: No LE edema noted. Skin: Warm/dry, no rashes.  Resolved problem List:    Assessment and Plan:  Acute encephalopathy and lethargy Diffuse cerebral edema with brain compression and central herniation syndrome, now with progression to tonsillar herniation, concern for impending brain death Central venous sinus thrombosis status post thrombectomy on anticoagulation with Coumadin  Supratherapeutic INR Transferred to 2H ICU post-Code Blue respiratory arrest. Required intubation. 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 - Neurology consulted, appreciate recommendations - HTS 3% at 65mL/hr + bolus given - S/p hypercoagulability workup 6/2; protein C/S activity low, +lupus anticoagulant panel - Can continue broad-spectrum antibiotics for now, though low likelihood of infectious etiology of symptoms - Unfortunately, patient has had total loss of brainstem reflexes consistent with evidence of worsening cerebral edema/brain compression and downward tonsillar herniation - PMT following, appreciate assistance; family will require significant support during this time - Will discuss further 6/12AM with in-person [place] interpreter  Shock, likely neurogenic - Goal MAP > 65 - Fluid resuscitation as tolerated - Levophed titrated to goal MAP while awaiting next steps - F/u Cx data - Continue broad-spectrum antibiotics  Acute hypoxemic respiratory failure requiring mechanical ventilation Patient not currently breathing over vent, progressing to brain death. - Continue full vent support (4-8cc/kg  IBW) - Wean FiO2 for O2 sat > 90% - Will not be a candidate for WUA/SBT/vent weaning - VAP bundle - Pulmonary hygiene - PAD protocol for sedation: None at present, no neurologic exam/reflexes - Follow ABG   Best Practice (right click and Reselect all SmartList Selections daily)   Diet/type: NPO DVT prophylaxis: SCDs, contraindicated in the setting of elevated INR to 7 Pressure ulcer(s): Please see nursing notes GI prophylaxis: PPI Lines: N/A Foley:  N/A Code Status:  full code Last date of multidisciplinary goals of care discussion [Pending]  Critical care time:   The patient is critically ill with multiple organ system failure and requires high complexity decision making for assessment and support, frequent evaluation and titration of therapies, advanced monitoring, review of radiographic studies and interpretation of complex data.   Critical Care Time devoted to patient care services, exclusive of separately billable procedures, described in this note is 38 minutes.  Star [place], PA-C [facility] 04/16/2024 5:35 AM  Please see [email or web address] for pager details.  From 7A-7P if no response, please call [phone] After hours, please call ELink [phone]

## 2024-05-11 NOTE — Progress Notes (Signed)
 Patient apena alarm alerted this RN to check on patient at 0300. Patient non responsive and O2 saturation dropped to 70%. Patient suctioned due to copious secretions with no cough or gag reflex. NRB mask applied and rapid response called. Rapid changed to code blue as patient had cyanosis around lips and at 49% O2 saturation. Respiratory at bedside with ambubag. Patient continued to have pulse but no respirations. Code team at bedside. Patient intubated. BP 89/50. Code Team Mds at bedside stating fixed and dilated pupils. Patient transferred to [facility].

## 2024-05-11 NOTE — Progress Notes (Signed)
 Patient ID: Filip Luten, male   DOB: 05-May-1991, 33 y.o.   MRN: [id number]    Progress Note from the Palliative Medicine Team at [facility]   Patient Name: Eldor Conaway        Date: 04/30/2024 DOB: 14-Oct-1991  Age: 33 y.o. MRN#: [id number] Attending Physician: Joesph Mussel, DO Primary Care Physician: Alto Atta, NP Admit Date: 04/19/2024   Reason for Consultation/Follow-up   Establishing Goals of Care   HPI/ Brief Hospital Review   6/9 - Presented to [facility] with AMS/lethargy/FTT, somnolent x 24H at home. CT Head with cerebral edema/downward brain herniation. Initially admitted to [facility]. 6/10 - PMT consulted given patient's poor prognosis and repeat hospitalizations. 6/11 - Early AM, CODE BLUE called for respiratory arrest, intubated by RT. Repeat CT Head with worsening cerebral edema  with central and downward intracranial mass effect/tonsillar herniation. Loss of brainstem reflexes. Neuro consulted. PCCM consulted for ICU transfer.  Family face treatment option decisions, advanced directive decisions and anticipatory care needs.  Subjective  Extensive chart review has been completed prior to meeting with patient/family  including labs, vital signs, imaging, progress/consult notes, orders, medications and available advance directive documents.    This NP assessed patient at the bedside as a follow up for palliative medicine needs and emotional support.  Dramatic changes since I met with family yesterday.   Rapid response for sudden episode of desaturation and respiratory failure requiring emergent intubation and transferred to the ICU.  Patient currently intubated.  Repeat head CT significant for worsening cerebral edema with central and downward intercranial mass effect and herniation.  Loss of brainstem reflexes CCM moving forward with brain death testing.   I met with family as scheduled to include his wife/ Cammie Cellar, brother/ Anise Kerns, mother, father, 3 church  members and Scientific laboratory technician offered on seriousness of overall medical condition.   Detailed conversation mapping current medical situation back to April, 2025 moving forward.   Significant events explored specific to pneumonia, mechanical thrombectomy complicated by viral meningitis and subsequent cerebral infarcts. Patient has had continued complications with worsening lethargy,  headache and overall failure to thrive.  Created space for family to explore thoughts and feelings regarding current medical situation.  This is hard for them to accept.   Family express sadness and intense grief over the events of the past few months.  Deneise Finlay NP CCM updated family and educated them on the possibility of brain death and the procedure and testing that we will follow to establish brain death.  Family verbalized understanding.  They await feedback information from CCM. Decisions will be dependent on that testing.   Their questions  turned to after death and deposition of his body.    Patient's mother wants body transfer back to [place], wife expresses desire for him to remain here in the [place] .  Family is encouraged to explore options and to continue conversation amongst themselves and possibly with the support of their church as they face further difficult decisions moving forward.       For now family wish to continue with current medical supportive measures until information is obtained requiring brain death.      If patient is liberated from the ventilator and life supporting measures family wish to be present.  Education offered today regarding  the importance of continued conversation with family and their  medical providers regarding overall plan of care and treatment options,  ensuring decisions are within the context of the patients values and  GOCs.  Emotional support offered  Questions and concerns addressed   Discussed with primary team and nursing staff   Time: 65    minutes  Discussed with Deneise Finlay, NP and bedside RN  Detailed review of medical records ( labs, imaging, vital signs), medically appropriate exam ( MS, skin, cardiac,  resp)   discussed with treatment team, counseling and education to patient, family, staff, documenting clinical information, medication management, coordination of care    Thena Fireman NP  Palliative Medicine Team Team Phone # [phone] Pager [phone]

## 2024-05-11 NOTE — Progress Notes (Signed)
 Pt sats dropping into the 70's. Pt respirations very shallow. Placing pt on NRB and calling rapid response. Changed to code blue, pt continues to have a pulse but does not have adequate respirations. Lips turning blue now. No gag reflex when trying to suction pt. BP 101/60. CBG 134 when checked. 0302-RT bagging pt. [id number] MD arrived.  0308- pt intubated #7.5 at 23 at the lip.  0310- BP 89/50 0312- BP 76/46. Pupils fixed and dilated per MD. 8295- Transferred to [facility].

## 2024-05-11 NOTE — Progress Notes (Addendum)
 Brain death testing started. Clinical exam completed- no reflexes No response to verbal or painful stimulation No corneal, pupillary, dolls eyes reflexes Negative cold calorics No cough or gag No response to trapezius squeeze bilaterally or painful stimulation.   ABG collected for start of apnea test. Critical result for sodium 167; discordant with AM BMP. Will recheck. This sodium level would be protective with cerebral edema rather than harmful, and with cerebral edema it would not be recommended to reverse this. May need to perform radionuclide testing to confirm.   Joesph Mussel, DO 04/16/2024 12:59 PM [facility]  For contact information, see Amion. If no response to pager, please call PCCM consult pager. After hours, 7PM- 7AM, please call Elink.      EXAM: NM BRAIN SCAN WITH FLOW - 4+ VIEW   TECHNIQUE: Radionuclide angiogram and static images of the brain were obtained after intravenous injection of radiopharmaceutical.   RADIOPHARMACEUTICALS:  21 millicuries technetium-54m Ceretec   COMPARISON:  CT head 04/18/2024   FINDINGS: Absent appreciable intracranial activity on flow images. Subsequent static images after 15 minutes demonstrate hot nose sign on the anterior image with lateral images demonstrating no substantial intracranial activity in the cerebrum, cerebellum, or visualized brainstem.   IMPRESSION: 1. Hot nose sign and lack of appreciable perfusion to the cerebrum, posterior fossa, and brainstem. This lack of visible perfusion constitutes supportive evidence for brain death in the context of a protocol-based clinical determination of brain death.    Radionuclide test testing confirms brain death in combination with previous physical exam. Due to BP instability now, he is not stable for apnea testing right now, but per protocol this is not required with the NM ancillary testing that supports the physical exam. Brain death confirmed.  Brother Thoait Clinger called-- left a message asking for him to call back. Called back a second time-- was able to update him that his brother's testing supports brain death. Family is in the hospital and wants to come see him before we disconnect him from the vent. He understands that within the next hour or so we will disconnect him from MV.   Time of death: 17:37  Joesph Mussel, DO 04/20/2024 5:47 PM [facility]  For contact information, see Amion. If no response to pager, please call PCCM consult pager. After hours, 7PM- 7AM, please call Elink.

## 2024-05-11 NOTE — Code Documentation (Signed)
 Called for code blue around 0300. Nurse reports hypoxia with shallow respirations, leading to bradycardia. No inciting events or signs of aspiration prior to this deterioration. On my arrival he is being ventilated adequately via bag valve mask. 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 He is unresponsive, with fixed and dilated pupils. He doesn't respond to painful stimuli. BVM providing adequate ventilations, then ETT placed by respiratory therapist at head of bed. RT notes no gag or cough on intubation, RN corroborates no gag or cough on suction earlier. He has strong radial pulses. Lungs with clear breath sounds bilaterally. After establishing airway he was moved to [facility]. Norepinephrine drip was was started for hypotension. CXR shows adequate tube placement and no apparent pneumothorax. After stabilization he was transported to CT for non-contrast head scan, which shows worsening diffuse cerebral edema since 04/12/24 without hemorrhage. Neurology consulted, appreciate their insight in this complex case. PCCM informed of ICU transfer and patient stabilized at time of sign out to them.  Adria Hopkins MD 04/23/2024, 4:39 AM

## 2024-05-11 NOTE — Procedures (Signed)
 Intubation Procedure Note  Samuel Warren  [id number]  November 10, 1991  Date:04/12/2024  Time:3:28 AM   Provider Performing:Alayna Mabe, Merceda Stairs    Procedure: Intubation (31500)  Indication(S) Respiratory Failure  Consent Unable to obtain consent due to emergent nature of procedure.   Anesthesia NONE   Time Out Verified patient identification, verified procedure, site/side was marked, verified correct patient position, special equipment/implants available, medications/allergies/relevant history reviewed, required imaging and test results available.   Sterile Technique Usual hand hygeine, masks, and gloves were used   Procedure Description Patient positioned in bed supine.  Sedation given as noted above.  Patient was intubated with endotracheal tube using Mac 4 larynscope.  View was Grade 1 full glottis .  Number of attempts was 1.  Colorimetric CO2 detector was consistent with tracheal placement.   Complications/Tolerance None; patient tolerated the procedure well. Chest X-ray is ordered to verify placement.   EBL Minimal    Specimen(s) None

## 2024-05-11 NOTE — Procedures (Signed)
 Extubation Procedure Note  Patient Details:   Name: Samuel Warren DOB: [birth year] MRN: [id number]   Airway Documentation:    Vent end date: 05/06/2024 Vent end time: 2058  Pt compassionately extubated per order.   Jayvin Hurrell R Zeyna Mkrtchyan 04/27/2024, 8:58 PM

## 2024-05-11 NOTE — Progress Notes (Signed)
 eLink Physician-Brief Progress Note Patient Name: Samuel Warren DOB: 23-Oct-1991 MRN: [id number]   Date of Service  04/23/2024  HPI/Events of Note  Family decided to withdraw care and transition to comfort measures. This decision was verified by me in the presence of the patient's bedside RN, Rise Cheng.  eICU Interventions  Extubation orders / Comfort measures orders placed.        [name] U [name] 05/02/2024, 8:33 PM

## 2024-05-11 NOTE — Progress Notes (Signed)
 eLink Physician-Brief Progress Note Patient Name: Samuel Warren DOB: October 23, 1991 MRN: [id number]   Date of Service  05/05/2024  HPI/Events of Note  Patient admitted to the Hospitalist Service with sepsis, pneumonia and altered mental status. They were transferred to the ICU tonight following intubation for airway protection.  eICU Interventions  New Patient Evaluation.        Kylen Ismael U Jacqulin Brandenburger 05/01/2024, 3:44 AM

## 2024-05-11 NOTE — Progress Notes (Signed)
 NEUROLOGY CONSULT FOLLOW UP NOTE   Date of service: April 21, 2024 Patient Name: Samuel Warren MRN:  [id number] DOB:  26-Jul-1991  Interval Hx/subjective   Seen in room with family at the bedside. Family and palliative care meeting scheduled for 11am with translator.   Vitals   Vitals:   04/30/2024 0835 04/28/2024 0840 05/03/2024 0845 05/04/2024 0850  BP: (!) 122/92 (!) 123/91 118/88 (!) 120/90  Pulse: 93 92 92 93  Resp: 20 20 20 20   Temp:      TempSrc:      SpO2: 98% 98% 98% 98%  Weight:      Height:         Body mass index is 21.3 kg/m.  Physical Exam   Constitutional: Appears well-developed and well-nourished.  Psych: Unresponsive Eyes: No scleral injection.  HENT: No OP obstrucion.  Head: Normocephalic.  Cardiovascular: Normal rate, rhythm; requiring vasopressor support  Respiratory: mechanically ventilated, not triggering ventilator  GI: Soft.  No distension. There is no tenderness.  Skin: WDI.   Neurologic Examination   Mental Status: Unresponsive, not following commands Cranial Nerves: II: pupils 7mm, fixed and dilated III,IV, VI: eyes midline  VIII: no response to verbal stimuli X: no cough or gag reflex Motor/Sensory No purposeful movement    Medications  Current Facility-Administered Medications:    0.9 %  sodium chloride  infusion, 250 mL, Intravenous, Continuous, Adria Hopkins, MD, Stopped at 04/16/2024 0755   acetaminophen  (TYLENOL ) tablet 650 mg, 650 mg, Oral, Q6H PRN **OR** acetaminophen  (TYLENOL ) suppository 650 mg, 650 mg, Rectal, Q6H PRN, [place], Rema Care, DO, 650 mg at 04/20/24 0943   ceFEPIme (MAXIPIME) 2 g in sodium chloride  0.9 % 100 mL IVPB, 2 g, Intravenous, Q8H, [place], Markiyah Gahm J, DO, Stopped at 04/15/2024 4098   Chlorhexidine  Gluconate Cloth 2 % PADS 6 each, 6 each, Topical, Daily, [place], Chayil Gantt J, DO   hydrALAZINE (APRESOLINE) injection 10 mg, 10 mg, Intravenous, Q6H PRN, [place], Aaralyn Kil J, DO   HYDROmorphone  (DILAUDID ) injection 0.5-1 mg, 0.5-1  mg, Intravenous, Q2H PRN, [place], Mercy Malena J, DO   levalbuterol (XOPENEX) nebulizer solution 0.63 mg, 0.63 mg, Nebulization, Q6H PRN, [place], Brooklynne Pereida J, DO   norepinephrine (LEVOPHED) 4mg  in (0.016 mg/mL) premix infusion, 0-10 mcg/min, Intravenous, Titrated, [place], Linna Richard, MD, Last Rate: 26.3 mL/hr at 05/06/2024 0819, 7 mcg/min at 05/09/2024 0819   ondansetron  (ZOFRAN ) tablet 4 mg, 4 mg, Oral, Q6H PRN **OR** ondansetron  (ZOFRAN ) injection 4 mg, 4 mg, Intravenous, Q6H PRN, [place], Tangala Wiegert J, DO   pantoprazole  (PROTONIX ) injection 40 mg, 40 mg, Intravenous, Daily, Alayne Hubert, Stephanie M, PA-C   potassium chloride  10 mEq in 100 mL IVPB, 10 mEq, Intravenous, Q1 Hr x 4, [place], Linna Richard, MD, Last Rate: 100 mL/hr at 04/22/2024 0819, Infusion Verify at 05/01/2024 0819   sodium chloride  (hypertonic) 3 % solution, , Intravenous, Continuous, Tona Francis, MD, Last Rate: 75 mL/hr at 05/08/2024 0819, Infusion Verify at 04/24/2024 0819   valproate (DEPACON ) 250 mg in dextrose  5 % 50 mL IVPB, 250 mg, Intravenous, Q12H, [place], Rema Care, DO, Stopped at 04/11/2024 0027  Labs and Diagnostic Imaging   CBC:  Recent Labs  Lab 04/19/24 1255 04/19/24 1822 04/20/24 0730 04/14/2024 0442 04/26/2024 0647  WBC 16.3*  --  11.3*  --  12.7*  NEUTROABS 14.2*  --   --   --   --   HGB 13.6   < > 13.1 12.6* 13.0  HCT 43.6   < > 41.6 37.0* 40.5  MCV 85.5  --  84.2  --  84.4  PLT 276  --  260  --  252   < > = values in this interval not displayed.    Basic Metabolic Panel:  Lab Results  Component Value Date   NA 147 (H) 04/11/2024   K 3.7 04/26/2024   CO2 19 (L) 05/08/2024   GLUCOSE 269 (H) 05/10/2024   BUN 13 04/29/2024   CREATININE 0.88 04/25/2024   CALCIUM  8.6 (L) 04/18/2024   GFRNONAA >60 04/24/2024   Lipid Panel:  Lab Results  Component Value Date   LDLCALC 58 03/08/2024   HgbA1c:  Lab Results  Component Value Date   HGBA1C 5.7 (H) 01/26/2024   Urine Drug Screen:     Component Value Date/Time   LABOPIA NONE  DETECTED 04/11/2024 0125   COCAINSCRNUR NONE DETECTED 04/11/2024 0125   LABBENZ NONE DETECTED 04/11/2024 0125   AMPHETMU NONE DETECTED 04/11/2024 0125   THCU NONE DETECTED 04/11/2024 0125   LABBARB NONE DETECTED 04/11/2024 0125    Alcohol Level     Component Value Date/Time   ETH <15 04/11/2024 0658   INR  Lab Results  Component Value Date   INR 1.2 04/24/2024   APTT  Lab Results  Component Value Date   APTT 41 (H) 03/18/2024    CT Head without contrast (Personally reviewed): Worsening cerebral edema with downward herniation of the cerebellum.   Assessment  Samuel Warren is a 33 y.o. 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  Examination after intubation revealed fixed dilated pupils and complete unresponsiveness to noxious stimulation. - On examination, he is at this point exhibiting no brainstem reflexes. - CT head concerning for downward cerebellar herniation.   - Prior brain imaging from 04/19/2024 also was suggestive of cerebral edema and downward brain herniation which now looks worse. - He is currently on hypertonic saline and does not have sedation running at this time. He is currently requiring vasopressor support. No brainstem reflexes on exam. Discussed brain death and apnea testing with CCM and the Neurology and CCM teams are in agreement   Recommendations   Continue 3% saline with q6hr sodium checks Supportive care per primary team May consider brain MRI when stable. Given exam and imaging, clinical brain death testing with apnea testing is warranted  ______________________________________________________________________   Signed, Imogene Mana, NP [facility]   Neurohospitalist  Electronically signed: Dr. Bertie Mcconathy

## 2024-05-11 NOTE — Consult Note (Signed)
 NEUROLOGY CONSULT NOTE   Date of service: April 21, 2024 Patient Name: Samuel Warren MRN:  [id number] DOB:  05-14-1991 Chief Complaint: Corinna Dickens, fixed dilated pupils Requesting Provider: [place], Eric J, DO  History of Present Illness  Samuel Warren is a 33 y.o. 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  On examination during this process of hypoxia and intubation, it was noted that he has fixed dilated pupils and is completely unresponsive on exam. No family at bedside at this time.  History obtained from the code team residents and bedside nursing.  LKW: Unclear Modified rankin score: 5-Severe disability-bedridden, incontinent, needs constant attention IV Thrombolysis: Unclear last known well, likely brain herniation, INR 5 EVT: Poor baseline NIH stroke scale 32   ROS  Unable to ascertain due to patient being comatose  Past History   Past Medical History:  Diagnosis Date   Deaf     Past Surgical History:  Procedure Laterality Date   IR ANGIO INTRA EXTRACRAN SEL COM CAROTID INNOMINATE BILAT MOD SED  02/02/2024   IR ANGIO INTRA EXTRACRAN SEL COM CAROTID INNOMINATE BILAT MOD SED  03/09/2024   IR CT HEAD LTD  03/09/2024   IR THROMBECT VENO MECH MOD SED  02/02/2024   IR THROMBECT VENO MECH MOD SED  03/09/2024   IR VENO SAGITTAL SINUS  03/09/2024   IR VENO/JUGULAR LEFT  02/02/2024   RADIOLOGY WITH ANESTHESIA N/A 02/02/2024   Procedure: RADIOLOGY WITH  ANESTHESIA;  Surgeon: Luellen Sages, MD;  Location: [facility] OR;  Service: Radiology;  Laterality: N/A;   RADIOLOGY WITH ANESTHESIA N/A 03/09/2024   Procedure: RADIOLOGY WITH ANESTHESIA;  Surgeon: Luellen Sages, MD;  Location: [facility] OR;  Service: Radiology;  Laterality: N/A;    Family History: History reviewed. No pertinent family history.  Social History  reports that he has never smoked. He has never used smokeless tobacco. He reports that he does not currently use alcohol. He reports that he does not currently use drugs.  No Known Allergies  Medications   Current Facility-Administered Medications:    0.9 %  sodium chloride  infusion, 250 mL, Intravenous, Continuous, Adria Hopkins, MD   acetaminophen  (TYLENOL ) tablet 650 mg, 650 mg, Oral, Q6H PRN **OR** acetaminophen  (TYLENOL ) suppository 650 mg, 650 mg, Rectal, Q6H PRN, [place], Rema Care, DO, 650 mg at 04/20/24 0943   ceFEPIme (MAXIPIME) 2 g in sodium chloride  0.9 % 100 mL IVPB, 2 g, Intravenous, Q8H, [place], Eric J, DO, Last Rate: 200 mL/hr at 04/20/24 2100, 2 g at 04/20/24 2100   hydrALAZINE (APRESOLINE) injection 10 mg, 10 mg, Intravenous, Q6H PRN, [place], Eric J, DO   HYDROmorphone  (DILAUDID ) injection 0.5-1 mg, 0.5-1 mg, Intravenous, Q2H PRN, [place], Rema Care, DO   [COMPLETED] lactated ringers  bolus 1,000 mL, 1,000 mL, Intravenous, Once, Stopped at 04/20/24 0945 **FOLLOWED BY** lactated ringers  infusion, , Intravenous, Continuous, [place], Rema Care, DO, Last Rate: 75 mL/hr at 04/20/24 0759, New Bag at 04/20/24 0759   levalbuterol (XOPENEX) nebulizer solution 0.63 mg, 0.63 mg, Nebulization, Q6H PRN, [place], Rema Care, DO  norepinephrine (LEVOPHED) 4mg  in (0.016 mg/mL) premix infusion, 0-10 mcg/min, Intravenous, Titrated, Adria Hopkins, MD, Last Rate: 37.5 mL/hr at 05/09/2024 0339, 10 mcg/min at 04/17/2024 0339   ondansetron  (ZOFRAN ) tablet 4 mg, 4 mg, Oral, Q6H PRN **OR** ondansetron  (ZOFRAN ) injection 4 mg, 4 mg, Intravenous, Q6H  PRN, [place], Eric J, DO   valproate (DEPACON ) 250 mg in dextrose  5 % 50 mL IVPB, 250 mg, Intravenous, Q12H, [place], Eric J, DO, Last Rate: 52.5 mL/hr at 04/20/24 2327, 250 mg at 04/20/24 2327   vancomycin  (VANCOCIN ) IVPB 1000 mg/200 mL premix, 1,000 mg, Intravenous, Q12H, Weingarten, Rachael I, RPH, Last Rate: 200 mL/hr at 04/20/24 1444, 1,000 mg at 04/20/24 1444  Vitals   Vitals:   04/20/24 2300 05/08/2024 0000 05/05/2024 0100 04/30/2024 0200  BP:  (!) 135/100    Pulse:  98    Resp: 20 19 19 19   Temp:  98.6 F (37 C)    TempSrc:  Axillary    SpO2:  96%      There is no height or weight on file to calculate BMI.   Physical Exam   General: Intubated, no sedation HEENT: Normocephalic dermatic Lungs: Vented CVS: Mildly tachycardic Neurological exam Intubated On no sedation No spontaneous movements Pupils are 7 mm, fixed nonreactive to light. Absent corneal reflexes bilaterally No cough No gag No movement to noxious stimulation Breathing with the ventilator   Labs/Imaging/Neurodiagnostic studies   CBC:  Recent Labs  Lab 2024-04-28 1255 04/28/2024 1822 04/20/24 0730  WBC 16.3*  --  11.3*  NEUTROABS 14.2*  --   --   HGB 13.6 13.6 13.1  HCT 43.6 40.0 41.6  MCV 85.5  --  84.2  PLT 276  --  260   Basic Metabolic Panel:  Lab Results  Component Value Date   NA 146 (H) 04/20/2024   K 3.3 (L) 04/20/2024   CO2 21 (L) 04/20/2024   GLUCOSE 139 (H) 04/20/2024   BUN 17 04/20/2024   CREATININE 0.80 04/20/2024   CALCIUM  8.7 (L) 04/20/2024   GFRNONAA >60 04/20/2024   Lipid Panel:  Lab Results  Component Value Date   LDLCALC 58 03/08/2024   HgbA1c:  Lab Results  Component Value Date   HGBA1C 5.7 (H) 01/26/2024   Urine Drug Screen:     Component Value Date/Time   LABOPIA NONE DETECTED 04/11/2024 0125   COCAINSCRNUR NONE DETECTED 04/11/2024 0125   LABBENZ NONE DETECTED 04/11/2024 0125   AMPHETMU NONE DETECTED 04/11/2024 0125   THCU NONE DETECTED 04/11/2024 0125    LABBARB NONE DETECTED 04/11/2024 0125    Alcohol Level     Component Value Date/Time   ETH <15 04/11/2024 0658   INR  Lab Results  Component Value Date   INR 5.0 (HH) 04/20/2024   APTT  Lab Results  Component Value Date   APTT 41 (H) 03/18/2024   CT Head without contrast(Personally reviewed): Worsening cerebral edema with downward herniation of the cerebellum.   ASSESSMENT   Samuel Warren is a 33 y.o. male with above past medical history that includes a very complicated history of CVST, possible viral meningitis, subsequent venous infarctions with anticoagulation now with Coumadin  being held due to supratherapeutic INR, being treated for pneumonia with sudden onset of respiratory distress and respiratory failure requiring emergent intubation.  Examination afterwards revealed fixed dilated pupils and complete unresponsiveness to noxious stimulation. On examination, he is at this point exhibiting no brainstem reflexes. CT head concerning for downward herniation.  Prior brain imaging from  04/19/2024 also was suggestive of cerebral edema and downward brain herniation which now looks worse.  Impression: Cerebral edema-worsening.  Unclear etiology  RECOMMENDATIONS  At this time, I am not sure if much of the treatment would change the eventual outcome but I would, given his young age and the fact that he is still full code, treat his cerebral edema with hypertonic saline. Bolus and maintenance 3% have been ordered. Supportive care per primary team as you are Continue palliative care conversations and it is very likely that he will completely progress to brain death in the next few hours. Consider brain MRI when stabilized in the morning. This was discussed with the code team residents and PCCM APP Shelli Dexter. Neurology will follow the exam with you ______________________________________________________________________    Signed, Tona Francis, MD [facility]   Neurohospitalist   CRITICAL CARE ATTESTATION Performed by: Tona Francis, MD Total critical care time: 40 minutes Critical care time was exclusive of separately billable procedures and treating other patients and/or supervising APPs/Residents/Students Critical care was necessary to treat or prevent imminent or life-threatening deterioration. This patient is critically ill and at significant risk for neurological worsening and/or death and care requires constant monitoring. Critical care was time spent personally by me on the following activities: development of treatment plan with patient and/or surrogate as well as nursing, discussions with consultants, evaluation of patient's response to treatment, examination of patient, obtaining history from patient or surrogate, ordering and performing treatments and interventions, ordering and review of laboratory studies, ordering and review of radiographic studies, pulse oximetry, re-evaluation of patient's condition, participation in multidisciplinary rounds and medical decision making of high complexity in the care of this patient.

## 2024-05-11 NOTE — Progress Notes (Addendum)
 Unable to do a NIH or good neuro assessment. Pt unable to hear. Is non-verbal. Pt opened eyes but not to command. Pt looked at nurse but did not track. Follows no commands.

## 2024-05-11 NOTE — Progress Notes (Addendum)
 04/22/2024 Patient examined. BP may be questionable for apnea test. Nuclear scan and clinical exam c/w brain death. Fits institutional criteria for brain death but can see if BP tolerates apnea test DNR Will see if family can come say goodbye. Dr. Fulton Job reaching out.  Ardelle Kos MD PCCM

## 2024-05-11 NOTE — Progress Notes (Signed)
 NAME:  Samuel Warren, MRN:  [id number], DOB:  11/17/1990, LOS: 2 ADMISSION DATE:  04/19/2024, CONSULTATION DATE: 04/20/2024 REFERRING MD:  [place] - [facility], CHIEF COMPLAINT: AMS, lethargy, FTT  History of Present Illness:  33 year old man who presented to [facility] 6/9 for AMS, lethargy, FTT. PMHx significant for congenital deafness, central venous sinus thrombosis (diagnosed 02/2024, s/p thrombectomy) with course was complicated by viral meningitis and cerebral infarction in the setting of venous sinus thrombosis. Patient was initially on Eliquis  but due to failed treatment, AC was switched to Coumadin . Patient was discharged to rehab 5/28. Readmitted to [facility] 5/31-6/6 for generalized weakness/lethargy, headache and poor PO intake. MRI Brain showed diffuse cerebral edema with early central herniation syndrome, Neurology was consulted, patient was started on antibiotics for possible meningitis, LP was not completed due to high INR and early herniation syndrome on MRI. Patient was eventually discharged 6/6.  Patient presented back to [facility] 6/9 with AMS, lethargy, FTT. On arrival, patient had low grade fever to 100.75F, HR 104, BP 140/99, RR 18 with SpO2 99%. Labs were notable for WBC 11.3, Hgb 13.1, Plt 260. INR 5.0. Na 146, K 3.3, CO2 21, BUN/Cr 17/0.80. Phos 2.4, Mg 2.0. LFTs WNL. PCT 0.20. LA 2.0. Pre-intubation ABG 7.51/27/105/22.5. Initial CXR with airspace disease at R base c/f PNA. Initial CT Head unchanged from prior with findings c/w cerebral edema and downward brain herniation. Patient was subsequently admitted to [facility] service.  On 6/11 early AM, patient experienced respiratory arrest and was not protecting his airway; he was intubated peri-code by RT and transferred to [facility]. No CPR was performed. 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  PCCM consulted in the  setting of ICU transfer/intubation.  Pertinent Medical History:  Dural venous sinus thrombosis Congenital deafness  Significant Hospital Events: Including procedures, antibiotic start and stop dates in addition to other pertinent events   6/9 - Presented to [facility] with AMS/lethargy/FTT, somnolent x 24H at home. CT Head with cerebral edema/downward brain herniation. Initially admitted to [facility]. 6/10 - PMT consulted given patient's poor prognosis and repeat hospitalizations. 6/11 - Early AM, CODE BLUE called for respiratory arrest, intubated by RT. Repeat CT Head with worsening cerebral edema  with central and downward intracranial mass effect/tonsillar herniation. Loss of brainstem reflexes. Neuro consulted. PCCM consulted for ICU transfer.  Interim History / Subjective:  Unresponsive with no cough or gag   Objective:   Blood pressure (!) 119/95, pulse 97, temperature 98.3 F (36.8 C), temperature source Oral, resp. rate 20, height 5' 4 (1.626 m), weight 56.3 kg, SpO2 99%.    Vent Mode: PRVC FiO2 (%):  [40 %-100 %] 40 % Set Rate:  [20 bmp-22 bmp] 20 bmp Vt Set:  [470 mL] 470 mL PEEP:  [5 cmH20] 5 cmH20 Plateau Pressure:  [14 cmH20] 14 cmH20   Intake/Output Summary (Last 24 hours) at 04/11/2024 0721 Last data filed at 04/13/2024 0600 Gross per 24 hour  Intake 732.31 ml  Output --  Net 732.31 ml    Filed Weights   04/14/2024 0544  Weight: 56.3 kg    Physical Examination: General: Acute ill appearing adult male lying in bed on mechanical ventilation unresponsive, in NAD HEENT: ETT, MM pink/moist, PERRL,  Neuro: Unresponsive, no cough, no gag, no pupillary responsiveness  CV: s1s2 regular rate and rhythm, no murmur, rubs, or gallops,  PULM:  Not breathing over vent, no  added breath sounds  GI: soft, bowel sounds active in all 4 quadrants, non-tender, non-distended Extremities: warm/dry, no edema  Skin: no rashes or lesions  Resolved problem List:    Assessment and Plan:    Diffuse cerebral edema with brain compression and central herniation syndrome, now with progression to tonsillar herniation, concern for impending brain death Central venous sinus thrombosis status post thrombectomy on anticoagulation with Coumadin  Acute encephalopathy and lethargy -Transferred to 2H ICU post-Code Blue respiratory arrest. Required intubation. 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 -S/p hypercoagulability workup 6/2; protein C/S activity low, +lupus anticoagulant panel P: Neurology following, appreciate assistance Hypertonic saline per neurology Neuroprotective measures Consider repeat MRI Broad-spectrum antibiotics as below PMT following given concern for progression to brain death  Shock, likely neurogenic P: Pressors for MAP goal greater than 65 Fluid resuscitation as tolerated Hypertonic saline as above Follow cultures Continue cefepime  Acute hypoxemic respiratory failure requiring mechanical ventilation Pneumonia, suspect bacterial RLL  -Patient not currently breathing over vent, progressing to brain death. P: Continue ventilator support with lung protective strategies  Wean PEEP and FiO2 for sats greater than 90%. Head of bed elevated 30 degrees. Plateau pressures less than 30 cm H20.  Follow intermittent chest x-ray and ABG.   SAT/SBT as tolerated, mentation preclude extubation  Ensure adequate pulmonary hygiene  Follow cultures  VAP bundle in place  PAD protocol  Supratherapeutic INR - INR admission 3.6 with uptrend to 5.0 P: Within normal this a.m.  Hypokalemia  P: Supplement as needed  Trend Bmet   GOC Meeting planned with palliative care and PCCM today 6/11 at 1100. Clinical exam remains consistent with brain death    Best Practice (right click and Reselect all SmartList  Selections daily)   Diet/type: NPO DVT prophylaxis: SCDs, contraindicated in the setting of elevated INR to 7 Pressure ulcer(s): Please see nursing notes GI prophylaxis: PPI Lines: N/A Foley:  N/A Code Status:  full code Last date of multidisciplinary goals of care discussion [Pending]  Critical care time:  CRITICAL CARE Performed by: Delbert Vu D. Harris   Total critical care time: 40 minutes  Critical care time was exclusive of separately billable procedures and treating other patients.  Critical care was necessary to treat or prevent imminent or life-threatening deterioration.  Critical care was time spent personally by me on the following activities: development of treatment plan with patient and/or surrogate as well as nursing, discussions with consultants, evaluation of patient's response to treatment, examination of patient, obtaining history from patient or surrogate, ordering and performing treatments and interventions, ordering and review of laboratory studies, ordering and review of radiographic studies, pulse oximetry and re-evaluation of patient's condition.  Nayda Riesen D. Harris, NP-C [facility] Personal contact information can be found on Amion  If no contact or response made please call [phone] 04/23/2024, 7:31 AM

## 2024-05-11 NOTE — Significant Event (Signed)
 Rapid Response Event Note   Reason for Call :  Respiratory arrest  Initial Focused Assessment:  On arrival to room patient is being ventilated with bag valve mask by respiratory. Patient is unresponsive, no cough, gag or corneals, pupils fixed and dilated, no movement to painful stimuli. RN states he was found apneic with SpO2 in the 40s, cyanotic.   Vital signs upon arrival: BP 104/63 HR 111 Resp 21 SpO2 89  Patient became hypotensive with SBP as low as the 50s after intubation, 1L NS bolus started.   Interventions:  VS CBG Intubation 1L bolus NS Transfer to ICU 2H18  Plan of Care:  Transfer to ICU. Neuro consult.  Event Summary:   MD Notified: [id number] Call Time: 0258 Arrival Time: 0301 End Time: 0430  Rojelio Clement, RN

## 2024-05-11 DEATH — deceased

## 2024-06-22 MED FILL — Medication: Qty: 1 | Status: AC
